# Patient Record
Sex: Female | Born: 1940
Health system: Southern US, Community
[De-identification: ages and names within clinical notes are randomized; demographics above are authoritative.]

## PROBLEM LIST (undated history)

## (undated) DIAGNOSIS — E785 Hyperlipidemia, unspecified: Secondary | ICD-10-CM

## (undated) DIAGNOSIS — N183 Chronic kidney disease, stage 3 unspecified: Secondary | ICD-10-CM

## (undated) DIAGNOSIS — N179 Acute kidney failure, unspecified: Secondary | ICD-10-CM

## (undated) DIAGNOSIS — K922 Gastrointestinal hemorrhage, unspecified: Secondary | ICD-10-CM

## (undated) DIAGNOSIS — I700 Atherosclerosis of aorta: Secondary | ICD-10-CM

## (undated) DIAGNOSIS — I739 Peripheral vascular disease, unspecified: Secondary | ICD-10-CM

## (undated) DIAGNOSIS — I10 Essential (primary) hypertension: Secondary | ICD-10-CM

## (undated) DIAGNOSIS — G3184 Mild cognitive impairment, so stated: Secondary | ICD-10-CM

## (undated) DIAGNOSIS — E7800 Pure hypercholesterolemia, unspecified: Secondary | ICD-10-CM

## (undated) DIAGNOSIS — D509 Iron deficiency anemia, unspecified: Secondary | ICD-10-CM

## (undated) DIAGNOSIS — D126 Benign neoplasm of colon, unspecified: Secondary | ICD-10-CM

## (undated) DIAGNOSIS — I509 Heart failure, unspecified: Secondary | ICD-10-CM

## (undated) DIAGNOSIS — E119 Type 2 diabetes mellitus without complications: Secondary | ICD-10-CM

## (undated) DIAGNOSIS — G2581 Restless legs syndrome: Secondary | ICD-10-CM

## (undated) DIAGNOSIS — D649 Anemia, unspecified: Secondary | ICD-10-CM

## (undated) DIAGNOSIS — K219 Gastro-esophageal reflux disease without esophagitis: Secondary | ICD-10-CM

## (undated) DIAGNOSIS — K5520 Angiodysplasia of colon without hemorrhage: Secondary | ICD-10-CM

## (undated) DIAGNOSIS — Z5189 Encounter for other specified aftercare: Secondary | ICD-10-CM

## (undated) DIAGNOSIS — I2510 Atherosclerotic heart disease of native coronary artery without angina pectoris: Secondary | ICD-10-CM

## (undated) DIAGNOSIS — Z9289 Personal history of other medical treatment: Secondary | ICD-10-CM

## (undated) HISTORY — PX: OTHER SURGICAL HISTORY: SHX169

## (undated) HISTORY — DX: Angiodysplasia of colon without hemorrhage: K55.20

## (undated) HISTORY — DX: Essential (primary) hypertension: I10

## (undated) HISTORY — DX: Hyperlipidemia, unspecified: E78.5

## (undated) HISTORY — PX: DOPPLER ECHOCARDIOGRAPHY: SHX263

## (undated) HISTORY — DX: Restless legs syndrome: G25.81

## (undated) HISTORY — DX: Type 2 diabetes mellitus without complications: E11.9

---

## 1974-10-24 HISTORY — PX: ABDOMINAL HYSTERECTOMY: SHX81

## 2001-11-24 HISTORY — PX: ABDOMINAL ADHESION SURGERY: SHX90

## 2002-10-29 ENCOUNTER — Encounter: Payer: Self-pay | Admitting: Internal Medicine

## 2004-11-01 ENCOUNTER — Ambulatory Visit: Payer: Self-pay | Admitting: Internal Medicine

## 2005-05-04 ENCOUNTER — Ambulatory Visit: Payer: Self-pay | Admitting: Internal Medicine

## 2005-11-15 ENCOUNTER — Ambulatory Visit: Payer: Self-pay | Admitting: Internal Medicine

## 2006-01-16 ENCOUNTER — Ambulatory Visit: Payer: Self-pay | Admitting: Internal Medicine

## 2006-05-17 ENCOUNTER — Ambulatory Visit: Payer: Self-pay | Admitting: Internal Medicine

## 2006-05-29 ENCOUNTER — Ambulatory Visit: Payer: Self-pay | Admitting: Unknown Physician Specialty

## 2006-07-24 HISTORY — PX: ESOPHAGOGASTRODUODENOSCOPY: SHX1529

## 2006-07-24 LAB — HM COLONOSCOPY

## 2006-08-04 ENCOUNTER — Other Ambulatory Visit: Payer: Self-pay

## 2006-08-04 ENCOUNTER — Ambulatory Visit: Payer: Self-pay | Admitting: Internal Medicine

## 2006-08-04 ENCOUNTER — Inpatient Hospital Stay: Payer: Self-pay | Admitting: Internal Medicine

## 2006-08-11 ENCOUNTER — Encounter: Payer: Self-pay | Admitting: Internal Medicine

## 2006-08-15 ENCOUNTER — Ambulatory Visit: Payer: Self-pay | Admitting: Internal Medicine

## 2006-08-18 ENCOUNTER — Ambulatory Visit: Payer: Self-pay | Admitting: Internal Medicine

## 2006-09-21 ENCOUNTER — Ambulatory Visit: Payer: Self-pay | Admitting: Internal Medicine

## 2006-11-28 ENCOUNTER — Encounter: Payer: Self-pay | Admitting: Internal Medicine

## 2007-02-02 ENCOUNTER — Encounter: Payer: Self-pay | Admitting: Internal Medicine

## 2007-02-02 ENCOUNTER — Ambulatory Visit: Payer: Self-pay | Admitting: Internal Medicine

## 2007-02-02 DIAGNOSIS — E7800 Pure hypercholesterolemia, unspecified: Secondary | ICD-10-CM

## 2007-02-02 DIAGNOSIS — K558 Other vascular disorders of intestine: Secondary | ICD-10-CM | POA: Insufficient documentation

## 2007-02-02 DIAGNOSIS — I10 Essential (primary) hypertension: Secondary | ICD-10-CM

## 2007-02-02 DIAGNOSIS — G2589 Other specified extrapyramidal and movement disorders: Secondary | ICD-10-CM | POA: Insufficient documentation

## 2007-02-02 DIAGNOSIS — E118 Type 2 diabetes mellitus with unspecified complications: Secondary | ICD-10-CM

## 2007-02-02 DIAGNOSIS — K5520 Angiodysplasia of colon without hemorrhage: Secondary | ICD-10-CM

## 2007-02-02 LAB — CONVERTED CEMR LAB
Basophils Relative: 0.1 % (ref 0.0–1.0)
Eosinophils Absolute: 0 10*3/uL (ref 0.0–0.6)
Eosinophils Relative: 0.6 % (ref 0.0–5.0)
MCV: 83.6 fL (ref 78.0–100.0)
Microalb Creat Ratio: 5.8 mg/g (ref 0.0–30.0)
Microalb, Ur: 0.2 mg/dL (ref 0.0–1.9)
Platelets: 223 10*3/uL (ref 150–400)
RBC: 4.28 M/uL (ref 3.87–5.11)
Total CHOL/HDL Ratio: 3.8
Triglycerides: 97 mg/dL (ref 0–149)
VLDL: 19 mg/dL (ref 0–40)
WBC: 5.1 10*3/uL (ref 4.5–10.5)

## 2007-08-07 ENCOUNTER — Ambulatory Visit: Payer: Self-pay | Admitting: Internal Medicine

## 2007-08-08 LAB — CONVERTED CEMR LAB
ALT: 15 units/L (ref 0–35)
Albumin: 4.4 g/dL (ref 3.5–5.2)
Alkaline Phosphatase: 61 units/L (ref 39–117)
CO2: 30 meq/L (ref 19–32)
Chloride: 107 meq/L (ref 96–112)
Cholesterol: 165 mg/dL (ref 0–200)
Creatinine, Ser: 0.6 mg/dL (ref 0.4–1.2)
GFR calc Af Amer: 129 mL/min
Glucose, Bld: 165 mg/dL — ABNORMAL HIGH (ref 70–99)
LDL Cholesterol: 113 mg/dL — ABNORMAL HIGH (ref 0–99)
Sodium: 144 meq/L (ref 135–145)
Total Protein: 8 g/dL (ref 6.0–8.3)

## 2008-01-24 ENCOUNTER — Ambulatory Visit: Payer: Self-pay | Admitting: Internal Medicine

## 2008-01-24 LAB — CONVERTED CEMR LAB
ALT: 11 units/L (ref 0–35)
AST: 18 units/L (ref 0–37)
BUN: 9 mg/dL (ref 6–23)
Basophils Absolute: 0 10*3/uL (ref 0.0–0.1)
Bilirubin, Direct: 0.1 mg/dL (ref 0.0–0.3)
CO2: 29 meq/L (ref 19–32)
Calcium: 9.6 mg/dL (ref 8.4–10.5)
Glucose, Bld: 128 mg/dL — ABNORMAL HIGH (ref 70–99)
HDL: 32.9 mg/dL — ABNORMAL LOW (ref >39.0)
Hgb A1c MFr Bld: 7.5 % — ABNORMAL HIGH (ref 4.6–6.0)
LDL Cholesterol: 103 mg/dL — ABNORMAL HIGH (ref 0–99)
Lymphocytes Relative: 28.8 % (ref 12.0–46.0)
MCHC: 33.4 g/dL (ref 30.0–36.0)
Microalb, Ur: 1.1 mg/dL (ref 0.0–1.9)
Monocytes Relative: 7.3 % (ref 3.0–12.0)
Neutrophils Relative %: 62.6 % (ref 43.0–77.0)
Phosphorus: 3.4 mg/dL (ref 2.3–4.6)
Platelets: 204 10*3/uL (ref 150–400)
Potassium: 3.9 meq/L (ref 3.5–5.1)
RDW: 12.9 % (ref 11.5–14.6)
TSH: 0.8 microintl units/mL (ref 0.35–5.50)
Total Bilirubin: 0.7 mg/dL (ref 0.3–1.2)
Total CHOL/HDL Ratio: 4.5
Triglycerides: 56 mg/dL (ref 0–149)
VLDL: 11 mg/dL (ref 0–40)

## 2008-02-20 ENCOUNTER — Encounter: Payer: Self-pay | Admitting: Internal Medicine

## 2008-02-22 ENCOUNTER — Encounter (INDEPENDENT_AMBULATORY_CARE_PROVIDER_SITE_OTHER): Payer: Self-pay | Admitting: *Deleted

## 2008-09-12 ENCOUNTER — Ambulatory Visit: Payer: Self-pay | Admitting: Internal Medicine

## 2008-09-15 LAB — CONVERTED CEMR LAB
Albumin: 4 g/dL (ref 3.5–5.2)
Alkaline Phosphatase: 56 units/L (ref 39–117)
Basophils Absolute: 0 10*3/uL (ref 0.0–0.1)
Basophils Relative: 0.5 % (ref 0.0–3.0)
CO2: 27 meq/L (ref 19–32)
Chloride: 106 meq/L (ref 96–112)
Cholesterol: 153 mg/dL (ref 0–200)
Creatinine, Ser: 0.6 mg/dL (ref 0.4–1.2)
Eosinophils Absolute: 0.1 10*3/uL (ref 0.0–0.7)
Eosinophils Relative: 1.3 % (ref 0.0–5.0)
GFR calc Af Amer: 129 mL/min
GFR calc non Af Amer: 106 mL/min
HCT: 37.7 % (ref 36.0–46.0)
Hgb A1c MFr Bld: 7 % — ABNORMAL HIGH (ref 4.6–6.0)
MCHC: 33.7 g/dL (ref 30.0–36.0)
MCV: 83.8 fL (ref 78.0–100.0)
Monocytes Absolute: 0.4 10*3/uL (ref 0.1–1.0)
Neutro Abs: 2.8 10*3/uL (ref 1.4–7.7)
Neutrophils Relative %: 59.3 % (ref 43.0–77.0)
Potassium: 3.7 meq/L (ref 3.5–5.1)
RBC: 4.49 M/uL (ref 3.87–5.11)
Sodium: 140 meq/L (ref 135–145)
Total Protein: 7.2 g/dL (ref 6.0–8.3)
VLDL: 11 mg/dL (ref 0–40)
WBC: 4.8 10*3/uL (ref 4.5–10.5)

## 2008-10-24 HISTORY — PX: OTHER SURGICAL HISTORY: SHX169

## 2009-03-17 ENCOUNTER — Ambulatory Visit: Payer: Self-pay | Admitting: Internal Medicine

## 2009-03-18 LAB — CONVERTED CEMR LAB
AST: 23 units/L (ref 0–37)
Albumin: 4.3 g/dL (ref 3.5–5.2)
Alkaline Phosphatase: 62 units/L (ref 39–117)
Basophils Absolute: 0 10*3/uL (ref 0.0–0.1)
Basophils Relative: 0.1 % (ref 0.0–3.0)
CO2: 30 meq/L (ref 19–32)
Calcium: 9.8 mg/dL (ref 8.4–10.5)
Chloride: 110 meq/L (ref 96–112)
Creatinine, Ser: 0.7 mg/dL (ref 0.4–1.2)
Creatinine,U: 39.7 mg/dL
Eosinophils Absolute: 0 10*3/uL (ref 0.0–0.7)
Hemoglobin: 12.9 g/dL (ref 12.0–15.0)
LDL Cholesterol: 91 mg/dL (ref 0–99)
MCHC: 34.9 g/dL (ref 30.0–36.0)
MCV: 82.8 fL (ref 78.0–100.0)
Microalb Creat Ratio: 5 mg/g (ref 0.0–30.0)
Microalb, Ur: 0.2 mg/dL (ref 0.0–1.9)
Monocytes Absolute: 0.4 10*3/uL (ref 0.1–1.0)
Neutro Abs: 3.3 10*3/uL (ref 1.4–7.7)
RBC: 4.44 M/uL (ref 3.87–5.11)
RDW: 12.8 % (ref 11.5–14.6)
Total Bilirubin: 0.7 mg/dL (ref 0.3–1.2)
Total CHOL/HDL Ratio: 4
Triglycerides: 69 mg/dL (ref 0.0–149.0)

## 2009-04-06 ENCOUNTER — Encounter: Payer: Self-pay | Admitting: Internal Medicine

## 2009-04-06 LAB — HM MAMMOGRAPHY

## 2009-04-08 ENCOUNTER — Encounter: Payer: Self-pay | Admitting: Internal Medicine

## 2009-06-24 ENCOUNTER — Telehealth: Payer: Self-pay | Admitting: Internal Medicine

## 2009-08-10 ENCOUNTER — Encounter: Payer: Self-pay | Admitting: Internal Medicine

## 2009-08-17 ENCOUNTER — Telehealth: Payer: Self-pay | Admitting: Internal Medicine

## 2009-08-21 ENCOUNTER — Ambulatory Visit: Payer: Self-pay | Admitting: Internal Medicine

## 2009-08-24 HISTORY — PX: DOBUTAMINE STRESS ECHO: SHX5426

## 2009-08-24 LAB — CONVERTED CEMR LAB
Albumin: 4.4 g/dL (ref 3.5–5.2)
BUN: 9 mg/dL (ref 6–23)
Calcium: 9.4 mg/dL (ref 8.4–10.5)
Creatinine, Ser: 0.7 mg/dL (ref 0.4–1.2)
Glucose, Bld: 109 mg/dL — ABNORMAL HIGH (ref 70–99)

## 2009-09-01 ENCOUNTER — Encounter: Payer: Self-pay | Admitting: Internal Medicine

## 2009-09-04 ENCOUNTER — Ambulatory Visit: Payer: Self-pay | Admitting: Internal Medicine

## 2009-09-09 ENCOUNTER — Telehealth: Payer: Self-pay | Admitting: Internal Medicine

## 2009-09-11 ENCOUNTER — Ambulatory Visit: Payer: Self-pay

## 2009-09-11 ENCOUNTER — Encounter: Payer: Self-pay | Admitting: Internal Medicine

## 2009-09-11 ENCOUNTER — Ambulatory Visit (HOSPITAL_COMMUNITY): Admission: RE | Admit: 2009-09-11 | Discharge: 2009-09-11 | Payer: Self-pay | Admitting: Internal Medicine

## 2009-09-11 ENCOUNTER — Ambulatory Visit: Payer: Self-pay | Admitting: Cardiology

## 2009-09-22 ENCOUNTER — Ambulatory Visit: Payer: Self-pay | Admitting: Unknown Physician Specialty

## 2009-12-16 ENCOUNTER — Ambulatory Visit: Payer: Self-pay | Admitting: Internal Medicine

## 2009-12-17 LAB — CONVERTED CEMR LAB
Alkaline Phosphatase: 54 units/L (ref 39–117)
BUN: 8 mg/dL (ref 6–23)
Basophils Absolute: 0 10*3/uL (ref 0.0–0.1)
Bilirubin, Direct: 0 mg/dL (ref 0.0–0.3)
Chloride: 109 meq/L (ref 96–112)
Cholesterol: 134 mg/dL (ref 0–200)
Creatinine, Ser: 0.7 mg/dL (ref 0.4–1.2)
Eosinophils Absolute: 0.1 10*3/uL (ref 0.0–0.7)
Eosinophils Relative: 1.6 % (ref 0.0–5.0)
GFR calc non Af Amer: 107.07 mL/min (ref >60)
HCT: 35.5 % — ABNORMAL LOW (ref 36.0–46.0)
Hgb A1c MFr Bld: 7.4 % — ABNORMAL HIGH (ref 4.6–6.5)
LDL Cholesterol: 75 mg/dL (ref 0–99)
Lymphs Abs: 1.5 10*3/uL (ref 0.7–4.0)
MCHC: 33 g/dL (ref 30.0–36.0)
MCV: 82.7 fL (ref 78.0–100.0)
Microalb Creat Ratio: 5.3 mg/g (ref 0.0–30.0)
Monocytes Absolute: 0.4 10*3/uL (ref 0.1–1.0)
Platelets: 225 10*3/uL (ref 150.0–400.0)
RDW: 13.8 % (ref 11.5–14.6)
Total Protein: 7.4 g/dL (ref 6.0–8.3)

## 2010-02-02 ENCOUNTER — Telehealth: Payer: Self-pay | Admitting: Internal Medicine

## 2010-05-10 ENCOUNTER — Telehealth: Payer: Self-pay | Admitting: Internal Medicine

## 2010-08-11 ENCOUNTER — Ambulatory Visit: Payer: Self-pay | Admitting: Internal Medicine

## 2010-08-12 LAB — CONVERTED CEMR LAB
Basophils Relative: 0.4 % (ref 0.0–3.0)
Calcium: 9.8 mg/dL (ref 8.4–10.5)
Eosinophils Relative: 2.5 % (ref 0.0–5.0)
GFR calc non Af Amer: 64.79 mL/min (ref >60)
Glucose, Bld: 77 mg/dL (ref 70–99)
Lymphocytes Relative: 30.7 % (ref 12.0–46.0)
Neutrophils Relative %: 57 % (ref 43.0–77.0)
Phosphorus: 3.6 mg/dL (ref 2.3–4.6)
Potassium: 4.6 meq/L (ref 3.5–5.1)
RBC: 3.87 M/uL (ref 3.87–5.11)
Sodium: 143 meq/L (ref 135–145)
WBC: 6.3 10*3/uL (ref 4.5–10.5)

## 2010-08-17 ENCOUNTER — Encounter: Payer: Self-pay | Admitting: Internal Medicine

## 2010-08-17 LAB — HM DIABETES EYE EXAM

## 2010-08-26 ENCOUNTER — Telehealth: Payer: Self-pay | Admitting: Internal Medicine

## 2010-11-23 NOTE — Progress Notes (Signed)
Summary: refill request for clonazepam  Phone Note Refill Request Message from:  [employer]  Refills Requested: Medication #1:  CLONAZEPAM 0.5 MG TABS 1 at bedtime as needed for sleep problems.   Last Refilled: 08/22/2009 Electronic request from [facility]  Initial call taken by: Lowella Petties CMA,  May 10, 2010 3:01 PM  Follow-up for Phone Call        okay #30 x 0 Follow-up by: Cindee Salt MD,  May 10, 2010 3:58 PM  Additional Follow-up for Phone Call Additional follow up Details #1::        [facility]. Additional Follow-up by: Lowella Petties CMA,  May 10, 2010 4:05 PM    Prescriptions: CLONAZEPAM 0.5 MG TABS (CLONAZEPAM) 1 at bedtime as needed for sleep problems  #30 x 0   Entered by:   Lowella Petties CMA   Authorized by:   Cindee Salt MD   Signed by:   Lowella Petties CMA on 05/10/2010   Method used:   Telephoned to ...       [facility] [phone]* (retail)       [address] [place], [place]  [address]       Ph: [phone]       Fax: [phone]   RxID:   [id number]

## 2010-11-23 NOTE — Assessment & Plan Note (Signed)
Summary: 5 M F/U DLO   Vital Signs:  Patient profile:   70 year old female Weight:      139 pounds Temp:     97.9 degrees F oral Pulse rate:   88 / minute Pulse rhythm:   regular BP sitting:   150 / 70  (left arm) Cuff size:   regular  Vitals Entered By: Mervin Hack CMA Duncan Dull) (December 16, 2009 8:01 AM) CC: 4 month follow-up   History of Present Illness: Had laryngeal polyp removed in November Voice is a little weaker No swallowing problems  Still on metformin once a day No problems with this checks most mornings----  often under 100 Occ feels shaky  No sores or pain in feet  No stomach trouble no bleeding  No chest pain No SOB No change in exercise tolerance  Allergies: 1)  Glucophage (Metformin Hcl) 2)  Procardia (Nifedipine)  Past History:  Past medical, surgical, family and social histories (including risk factors) reviewed for relevance to current acute and chronic problems.  Past Medical History: Reviewed history from 09/12/2008 and no changes required. Diabetes mellitus, type II Hypertension Hyperlipidemia Restless legs syndrome GI arteriovenous malformations--stoomach  [facility] Dr Marva Panda    [phone] Dr Beckey Rutter  [phone]  Past Surgical History: Hysterectomy/RSO (fibroid) 1976 TOP Lysis of adhesiions Katrinka Blazing) 02/03 Anemia- EGD/colon- multiple angiooectasias 10/07 Echo- EF 55%, mild MR, TR 11/10  Stress echo normal 1/10 Laryngeal polyp--- Dr Jenne Campus  Family History: Reviewed history from 07/12/2007 and no changes required. Dad died of stroke @60  Mom and sibling with HTN GM with DM No CAD or cancer  Social History: Reviewed history from 07/12/2007 and no changes required. 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   Neck:  supple, no masses, no thyromegaly, no carotid bruits, and no cervical lymphadenopathy.   Lungs:  normal respiratory effort and normal breath sounds.   Heart:  normal rate, regular rhythm, no murmur, and no gallop.   Abdomen:  soft and non-tender.   Msk:  no joint tenderness and no joint swelling.   Pulses:  2+ in feet Extremities:  no edema Neurologic:  alert & oriented X3, strength normal in all extremities, and gait normal.   Skin:  no rashes, no suspicious lesions, and no ulcerations.   Psych:  normally interactive, good eye contact, not anxious appearing, and not depressed appearing.    Diabetes Management Exam:    Foot Exam (with socks and/or shoes not present):       Sensory-Pinprick/Light touch:          Left medial foot (L-4): normal          Left dorsal foot (L-5): normal          Left lateral foot (S-1): normal          Right medial foot (L-4): normal          Right dorsal foot (L-5): normal          Right lateral foot (S-1): normal       Inspection:          Left foot: normal          Right foot: normal       Nails:  Left foot: thickened          Right foot: thickened   Impression & Recommendations:  Problem # 1:  DIABETES MELLITUS, TYPE II (ICD-250.00) Assessment Comment Only  doing well sugars run low will change the glucotrol to just daily  Her updated medication list for this problem includes:    Lisinopril 40 Mg Tabs (Lisinopril) .Marland Kitchen... Take 1 tablet by mouth once a day    Glucotrol Xl 10 Mg Tb24 (Glipizide) .Marland Kitchen... Take one by mouth daily before breakfast    Metformin Hcl 500 Mg Xr24h-tab (Metformin hcl) .Marland Kitchen... Take 1 tablet by mouth once a day  Labs Reviewed: Creat: 0.7 (08/21/2009)     Last Eye Exam: No diabetic retinopathy.    (09/01/2009) Reviewed HgBA1c results: 6.9 (08/21/2009)  7.7 (03/17/2009)  Orders: TLB-Microalbumin/Creat Ratio, Urine (82043-MALB) TLB-A1C / Hgb A1C (Glycohemoglobin)  (83036-A1C)  Problem # 2:  HYPERTENSION (ICD-401.9) Assessment: Unchanged  probably okay will add med if urine microal is positive  Her updated medication list for this problem includes:    Lisinopril 40 Mg Tabs (Lisinopril) .Marland Kitchen... Take 1 tablet by mouth once a day  BP today: 150/70 Prior BP: 160/70 (09/04/2009)  Prior 10 Yr Risk Heart Disease: Not enough information (02/02/2007)  Labs Reviewed: K+: 3.9 (08/21/2009) Creat: : 0.7 (08/21/2009)   Chol: 143 (03/17/2009)   HDL: 38.20 (03/17/2009)   LDL: 91 (03/17/2009)   TG: 69.0 (03/17/2009)  Orders: TLB-Renal Function Panel (80069-RENAL) TLB-TSH (Thyroid Stimulating Hormone) (84443-TSH)  Problem # 3:  HYPERCHOLESTEROLEMIA (ICD-272.0) Assessment: Unchanged  good control due for labs  Her updated medication list for this problem includes:    Simvastatin 80 Mg Tabs (Simvastatin) .Marland Kitchen... Take 1 tablet by mouth once a day  Labs Reviewed: SGOT: 23 (03/17/2009)   SGPT: 14 (03/17/2009)  Prior 10 Yr Risk Heart Disease: Not enough information (02/02/2007)   HDL:38.20 (03/17/2009), 37.9 (09/12/2008)  LDL:91 (03/17/2009), 105 (84/13/2440)  Chol:143 (03/17/2009), 153 (09/12/2008)  Trig:69.0 (03/17/2009), 53 (09/12/2008)  Orders: TLB-Lipid Panel (80061-LIPID) TLB-Hepatic/Liver Function Pnl (80076-HEPATIC)  Problem # 4:  ARTERIOVENOUS MALFORMATION (ICD-747.60) Assessment: Unchanged  on iron will check CBC  Orders: TLB-CBC Platelet - w/Differential (85025-CBCD) Venipuncture (10272)  Complete Medication List: 1)  Simvastatin 80 Mg Tabs (Simvastatin) .... Take 1 tablet by mouth once a day 2)  Omeprazole 20 Mg Cpdr (Omeprazole) .... Take 1 capsule by mouth once a day 3)  Lisinopril 40 Mg Tabs (Lisinopril) .... Take 1 tablet by mouth once a day 4)  Sucralfate 1 Gm Tabs (Sucralfate) .... Take one by mouth before meals and at bedtime 5)  Glucotrol Xl 10 Mg Tb24 (Glipizide) .... Take one by mouth daily before breakfast 6)  Onetouch Test  Strp (Glucose blood) .Marland Kitchen.. 1 daily as directed 7)  Metformin Hcl 500 Mg Xr24h-tab (Metformin hcl) .... Take 1 tablet by mouth once a day 8)  Ferrous Sulfate 324 Mg Tabs (Ferrous sulfate) .... Take 1 by mouth once daily 9)  Clonazepam 0.5 Mg Tabs (Clonazepam) .Marland Kitchen.. 1 at bedtime as needed for sleep problems  Patient Instructions: 1)  Please decrease the glucotrol to just one daily 2)  Please schedule a follow-up appointment in 6 months .   Current Allergies (reviewed today): GLUCOPHAGE (METFORMIN HCL) PROCARDIA (NIFEDIPINE)      Mammogram  Procedure date:  04/06/2009  Findings:      No specific mammographic evidence of malignancy.

## 2010-11-23 NOTE — Progress Notes (Signed)
Summary: CLONAZEPAM  Phone Note Refill Request Message from:  [facility] [phone] on August 26, 2010 8:45 AM  Refills Requested: Medication #1:  CLONAZEPAM 0.5 MG TABS 1 at bedtime as needed for sleep problems   Last Refilled: 05/10/2010 E-Scribe Request    Method Requested: Telephone to Pharmacy Initial call taken by: Mervin Hack CMA Duncan Dull),  August 26, 2010 8:45 AM  Follow-up for Phone Call        okay #30 x 0 Follow-up by: Cindee Salt MD,  August 26, 2010 1:17 PM  Additional Follow-up for Phone Call Additional follow up Details #1::        Rx called to pharmacy Additional Follow-up by: DeShannon Katrinka Blazing CMA Duncan Dull),  August 26, 2010 3:14 PM    Prescriptions: CLONAZEPAM 0.5 MG TABS (CLONAZEPAM) 1 at bedtime as needed for sleep problems  #30 x 0   Entered by:   Mervin Hack CMA (AAMA)   Authorized by:   Cindee Salt MD   Signed by:   Mervin Hack CMA (AAMA) on 08/26/2010   Method used:   Telephoned to ...       [facility] [phone]* (retail)       [address] [place], [place]  [address]       Ph: [phone]       Fax: [phone]   RxID:   [id number]

## 2010-11-23 NOTE — Letter (Signed)
Summary: [facility]  [facility]   Imported By: Sherian Rein 09/28/2010 11:44:09  _____________________________________________________________________  External Attachment:    Type:   Image     Comment:   External Document  Appended Document: [facility]    Clinical Lists Changes  Observations: Added new observation of DIAB EYE EX: No diabetic retinopathy.   Glaucoma.   Cataract.    (08/17/2010 13:33)       Diabetic Eye Exam  Procedure date:  08/17/2010  Findings:      No diabetic retinopathy.   Glaucoma.   Cataract.

## 2010-11-23 NOTE — Progress Notes (Signed)
Summary: form for diabetic supplies from [facility]  Phone Note From Pharmacy   Caller: [facility] Summary of Call: Form for diabetic supplies is on  your desk, I checked with the pt and she does want this. Initial call taken by: Lowella Petties CMA,  February 02, 2010 4:34 PM  Follow-up for Phone Call        form done Follow-up by: Cindee Salt MD,  February 03, 2010 1:48 PM  Additional Follow-up for Phone Call Additional follow up Details #1::        form faxed back to [facility] Additional Follow-up by: Mervin Hack CMA Duncan Dull),  February 03, 2010 2:16 PM

## 2010-11-23 NOTE — Assessment & Plan Note (Signed)
Summary: 6 month follow up/alc   Vital Signs:  Patient profile:   70 year old female Weight:      135 pounds BMI:     23.62 Temp:     98.0 degrees F oral Pulse rate:   76 / minute Pulse rhythm:   regular BP sitting:   160 / 68  (left arm) Cuff size:   regular  Vitals Entered By: Mervin Hack CMA Duncan Dull) (August 11, 2010 10:38 AM) CC: 6 month follow-up   History of Present Illness: DOing well No new concerns  No signs of bleeding No problems with energy Still on iron tabs  Checks sugars every 2-3 days Highest is 143 No hypoglycemic reactions  No cehst pain No SOB No palpitations No edema  No trouble with chol med  Allergies: 1)  Glucophage (Metformin Hcl) 2)  Procardia (Nifedipine)  Past History:  Past medical, surgical, family and social histories (including risk factors) reviewed for relevance to current acute and chronic problems.  Past Medical History: Reviewed history from 09/12/2008 and no changes required. Diabetes mellitus, type II Hypertension Hyperlipidemia Restless legs syndrome GI arteriovenous malformations--stoomach  [facility] Dr Marva Panda    [phone] Dr Beckey Rutter  [phone]  Past Surgical History: Reviewed history from 12/16/2009 and no changes required. Hysterectomy/RSO (fibroid) 1976 TOP Lysis of adhesiions Katrinka Blazing) 02/03 Anemia- EGD/colon- multiple angiooectasias 10/07 Echo- EF 55%, mild MR, TR 11/10  Stress echo normal 1/10 Laryngeal polyp--- Dr Jenne Campus  Family History: Reviewed history from 07/12/2007 and no changes required. Dad died of stroke @60  Mom and sibling with HTN GM with DM No CAD or cancer  Social History: Reviewed history from 07/12/2007 and no changes required. 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   Neck:  supple, no masses, no thyromegaly, no carotid bruits, and no cervical lymphadenopathy.   Lungs:  normal respiratory effort, no intercostal retractions, no accessory muscle use, and normal breath sounds.   Heart:  normal rate, regular rhythm, no murmur, and no gallop.   Msk:  no joint tenderness and no joint swelling.   Pulses:  1+ in feet Extremities:  no edema Neurologic:  alert & oriented X3, strength normal in all extremities, and gait normal.   Skin:  no suspicious lesions and no ulcerations.   Psych:  normally interactive, good eye contact, not anxious appearing, and not depressed appearing.    Diabetes Management Exam:    Foot Exam (with socks and/or shoes not present):       Sensory-Pinprick/Light touch:          Left medial foot (L-4): normal          Left dorsal foot (L-5): normal          Left lateral foot (S-1): normal          Right medial foot (L-4): normal          Right dorsal foot (L-5): normal          Right lateral foot (S-1): normal       Inspection:          Left foot: normal          Right foot: normal  Nails:          Left foot: thickened          Right foot: thickened   Impression & Recommendations:  Problem # 1:  DIABETES MELLITUS, TYPE II (ICD-250.00) Assessment Unchanged  seems to still have good control will check A1c  Her updated medication list for this problem includes:    Lisinopril 40 Mg Tabs (Lisinopril) .Marland Kitchen... Take 1 tablet by mouth once a day    Glucotrol Xl 10 Mg Tb24 (Glipizide) .Marland Kitchen... Take one by mouth daily before breakfast    Metformin Hcl 500 Mg Xr24h-tab (Metformin hcl) .Marland Kitchen... Take 1 tablet by mouth once a day  Labs Reviewed: Creat: 0.7 (12/16/2009)     Last Eye Exam: No diabetic retinopathy.    (09/01/2009) Reviewed HgBA1c results: 7.4 (12/16/2009)  6.9 (08/21/2009)  Orders: TLB-A1C / Hgb A1C (Glycohemoglobin)  (83036-A1C) Venipuncture (16109)  Problem # 2:  HYPERTENSION (ICD-401.9) Assessment: Unchanged  reasonable control no change for now--risk of hyptension given age and relatively low diastolic  Her updated medication list for this problem includes:    Lisinopril 40 Mg Tabs (Lisinopril) .Marland Kitchen... Take 1 tablet by mouth once a day  BP today: 160/68 Prior BP: 150/70 (12/16/2009)  Prior 10 Yr Risk Heart Disease: Not enough information (02/02/2007)  Labs Reviewed: K+: 3.9 (12/16/2009) Creat: : 0.7 (12/16/2009)   Chol: 134 (12/16/2009)   HDL: 42.90 (12/16/2009)   LDL: 75 (12/16/2009)   TG: 79.0 (12/16/2009)  Orders: TLB-Renal Function Panel (80069-RENAL) TLB-CBC Platelet - w/Differential (85025-CBCD)  Problem # 3:  ARTERIOVENOUS MALFORMATION (ICD-747.60) Assessment: Unchanged no apparent bleeding will check CBC  Problem # 4:  HYPERCHOLESTEROLEMIA (ICD-272.0) Assessment: Unchanged good control labs next time  Her updated medication list for this problem includes:    Simvastatin 80 Mg Tabs (Simvastatin) .Marland Kitchen... Take 1 tablet by mouth once a day  Labs Reviewed: SGOT: 16 (12/16/2009)   SGPT: 9 (12/16/2009)  Prior 10 Yr Risk Heart Disease: Not enough information (02/02/2007)   HDL:42.90 (12/16/2009), 38.20 (03/17/2009)  LDL:75 (12/16/2009), 91 (03/17/2009)  Chol:134 (12/16/2009), 143 (03/17/2009)  Trig:79.0 (12/16/2009), 69.0 (03/17/2009)  Problem # 5:  OTHER SCREENING MAMMOGRAM (ICD-V76.12) Assessment: Comment Only will cut down to every 2 years  Complete Medication List: 1)  Simvastatin 80 Mg Tabs (Simvastatin) .... Take 1 tablet by mouth once a day 2)  Omeprazole 20 Mg Cpdr (Omeprazole) .... Take 1 capsule by mouth once a day 3)  Lisinopril 40 Mg Tabs (Lisinopril) .... Take 1 tablet by mouth once a day 4)  Sucralfate 1 Gm Tabs (Sucralfate) .... Take one by mouth before meals and at bedtime 5)  Glucotrol Xl 10 Mg Tb24 (Glipizide) .... Take one by mouth daily before breakfast 6)   Metformin Hcl 500 Mg Xr24h-tab (Metformin hcl) .... Take 1 tablet by mouth once a day 7)  Clonazepam 0.5 Mg Tabs (Clonazepam) .Marland Kitchen.. 1 at bedtime as needed for sleep problems 8)  Ferrous Sulfate 324 Mg Tabs (Ferrous sulfate) .... Take 1 by mouth once daily 9)  Onetouch Test Strp (Glucose blood) .Marland Kitchen.. 1 daily as directed  Other Orders: Flu Vaccine 55yrs + MEDICARE PATIENTS (U0454) Administration Flu vaccine - MCR (U9811)  Patient Instructions: 1)  Please schedule a follow-up appointment in 6 months .    Orders Added: 1)  Flu Vaccine 7yrs + MEDICARE PATIENTS [Q2039] 2)  Administration Flu vaccine - MCR [G0008] 3)  Est. Patient Level IV [91478] 4)  TLB-A1C / Hgb A1C (Glycohemoglobin) [83036-A1C] 5)  Venipuncture [29562]  6)  TLB-Renal Function Panel [80069-RENAL] 7)  TLB-CBC Platelet - w/Differential [85025-CBCD]   Immunizations Administered:  Influenza Vaccine # 1:    Vaccine Type: Fluvax MCR    Site: left deltoid    Mfr: GlaxoSmithKline    Dose: 0.5 ml    Route: IM    Given by: Mervin Hack CMA (AAMA)    Exp. Date: 04/23/2011    Lot #: [id number]    VIS given: 05/18/10 version given August 11, 2010.  Flu Vaccine Consent Questions:    Do you have a history of severe allergic reactions to this vaccine? no    Any prior history of allergic reactions to egg and/or gelatin? no    Do you have a sensitivity to the preservative Thimersol? no    Do you have a past history of Guillan-Barre Syndrome? no    Do you currently have an acute febrile illness? no    Have you ever had a severe reaction to latex? no    Vaccine information given and explained to patient? yes    Are you currently pregnant? no   Immunizations Administered:  Influenza Vaccine # 1:    Vaccine Type: Fluvax MCR    Site: left deltoid    Mfr: GlaxoSmithKline    Dose: 0.5 ml    Route: IM    Given by: Mervin Hack CMA (AAMA)    Exp. Date: 04/23/2011    Lot #: [id number]    VIS given: 05/18/10 version given  August 11, 2010.  Current Allergies (reviewed today): GLUCOPHAGE (METFORMIN HCL) PROCARDIA (NIFEDIPINE)

## 2010-12-15 ENCOUNTER — Encounter: Payer: Self-pay | Admitting: Internal Medicine

## 2010-12-22 ENCOUNTER — Encounter: Payer: Self-pay | Admitting: Internal Medicine

## 2011-01-03 ENCOUNTER — Telehealth: Payer: Self-pay | Admitting: Internal Medicine

## 2011-01-11 NOTE — Progress Notes (Signed)
Summary: CLONAZEPAM  Phone Note Refill Request Message from:  [facility] [phone] on January 03, 2011 4:07 PM  Refills Requested: Medication #1:  CLONAZEPAM 0.5 MG TABS 1 at bedtime as needed for sleep problems   Last Refilled: 08/26/2010 E-Scribe Request    Method Requested: Telephone to Pharmacy Initial call taken by: Mervin Hack CMA Duncan Dull),  January 03, 2011 4:07 PM  Follow-up for Phone Call        okay #30 x 0 Follow-up by: Cindee Salt MD,  January 03, 2011 5:51 PM  Additional Follow-up for Phone Call Additional follow up Details #1::        Rx called to pharmacy Additional Follow-up by: DeShannon Katrinka Blazing CMA Duncan Dull),  January 03, 2011 6:47 PM    Prescriptions: CLONAZEPAM 0.5 MG TABS (CLONAZEPAM) 1 at bedtime as needed for sleep problems  #30 x 0   Entered by:   Mervin Hack CMA (AAMA)   Authorized by:   Cindee Salt MD   Signed by:   Mervin Hack CMA (AAMA) on 01/03/2011   Method used:   Telephoned to ...       [facility] [phone]* (retail)       [address] [place], [place]  [address]       Ph: [phone]       Fax: [phone]   RxID:   [id number]

## 2011-02-11 ENCOUNTER — Encounter: Payer: Self-pay | Admitting: Internal Medicine

## 2011-02-11 ENCOUNTER — Ambulatory Visit (INDEPENDENT_AMBULATORY_CARE_PROVIDER_SITE_OTHER): Payer: Medicare Other | Admitting: Internal Medicine

## 2011-02-11 VITALS — BP 140/60 | HR 68 | Temp 98.1°F | Ht 63.0 in | Wt 137.0 lb

## 2011-02-11 DIAGNOSIS — E7800 Pure hypercholesterolemia, unspecified: Secondary | ICD-10-CM

## 2011-02-11 DIAGNOSIS — E119 Type 2 diabetes mellitus without complications: Secondary | ICD-10-CM

## 2011-02-11 DIAGNOSIS — K5520 Angiodysplasia of colon without hemorrhage: Secondary | ICD-10-CM

## 2011-02-11 DIAGNOSIS — G2589 Other specified extrapyramidal and movement disorders: Secondary | ICD-10-CM

## 2011-02-11 DIAGNOSIS — I10 Essential (primary) hypertension: Secondary | ICD-10-CM

## 2011-02-11 LAB — CBC WITH DIFFERENTIAL/PLATELET
Basophils Relative: 0.5 % (ref 0.0–3.0)
Eosinophils Relative: 1.5 % (ref 0.0–5.0)
Hemoglobin: 11 g/dL — ABNORMAL LOW (ref 12.0–15.0)
Lymphocytes Relative: 27.2 % (ref 12.0–46.0)
Neutro Abs: 3.7 10*3/uL (ref 1.4–7.7)
Neutrophils Relative %: 61 % (ref 43.0–77.0)
RBC: 3.94 Mil/uL (ref 3.87–5.11)
WBC: 6.1 10*3/uL (ref 4.5–10.5)

## 2011-02-11 LAB — LIPID PANEL
LDL Cholesterol: 84 mg/dL (ref 0–99)
VLDL: 19 mg/dL (ref 0.0–40.0)

## 2011-02-11 LAB — HEMOGLOBIN A1C: Hgb A1c MFr Bld: 6.5 % (ref 4.6–6.5)

## 2011-02-11 NOTE — Progress Notes (Signed)
Subjective:    Patient ID: Kathryn Ware, female    DOB: 09-Jul-1941, 70 y.o.   MRN: [id number]  HPI Doing okay Still enjoying being retirement Likes to keep up yard, flowers Tries to walk regularly Still part time at [employer] (4 hours per day)  Checks sugars 3 times per week Run 130's generally No hypolgycemic reactions Due for eye exam next week  No ches tpain No SOB No sig edema Occ fatigue --if she overdoes it No blood in stool--continues on the iron  Current outpatient prescriptions:clonazePAM (KLONOPIN) 0.5 MG tablet, Take 0.5 mg by mouth at bedtime as needed.  , Disp: , Rfl: ;  ferrous sulfate 325 (65 FE) MG tablet, Take 325 mg by mouth daily with breakfast.  , Disp: , Rfl: ;  glipiZIDE (GLUCOTROL) 10 MG 24 hr tablet, Take 10 mg by mouth daily.  , Disp: , Rfl: ;  glucose blood test strip, 1 each by Other route daily. Use as instructed , Disp: , Rfl:  lisinopril (PRINIVIL,ZESTRIL) 40 MG tablet, Take 40 mg by mouth daily.  , Disp: , Rfl: ;  metFORMIN (GLUCOPHAGE-XR) 500 MG 24 hr tablet, Take 500 mg by mouth daily with breakfast.  , Disp: , Rfl: ;  omeprazole (PRILOSEC) 20 MG capsule, Take 20 mg by mouth daily.  , Disp: , Rfl: ;  simvastatin (ZOCOR) 80 MG tablet, Take 80 mg by mouth at bedtime.  , Disp: , Rfl:  sucralfate (CARAFATE) 1 G tablet, Take 1 g by mouth 4 (four) times daily.  , Disp: , Rfl:   Past Medical History  Diagnosis Date  . Diabetes mellitus type II   . HTN (hypertension)   . HLD (hyperlipidemia)   . RLS (restless legs syndrome)   . GI AVM (gastrointestinal arteriovenous vascular malformation)     stomach    Past Surgical History  Procedure Date  . Abdominal hysterectomy 1976    RSO (fibroid)  . Top   . Abdominal adhesion surgery 11/2001    Dr. Katrinka Blazing  . Esophagogastroduodenoscopy 07/2006    multiple angioectasias  . Doppler echocardiography     EF 55%, mild MR, TR  . Dobutamine stress echo 11/10    normal  . Laryngeal polyp 10/2008    Dr. Jenne Campus     Family History  Problem Relation Age of Onset  . Stroke Father   . Hypertension Mother   . Hypertension      sibling  . Diabetes      grandmother    History   Social History  . Marital Status: Single    Spouse Name: N/A    Number of Children: 1  . Years of Education: N/A   Occupational History  . Seamstress     [employer]   Social History Main Topics  . Smoking status: Current Everyday Smoker  . Smokeless tobacco: Not on file  . Alcohol Use: No  . Drug Use: Not on file  . Sexually Active: Not on file   Other Topics Concern  . Not on file   Social History Narrative  . No narrative on file   Review of Systems Appetite is fine Still doesn't sleep great---but okay Weight is stable     Objective:   Physical Exam  Constitutional: She appears well-developed and well-nourished. No distress.  Neck: Normal range of motion. Neck supple. No thyromegaly present.  Cardiovascular: Normal rate, regular rhythm, normal heart sounds and intact distal pulses.  Exam reveals no gallop.  No murmur heard. Pulmonary/Chest: Effort normal and breath sounds normal. No respiratory distress. She has no wheezes. She has no rales.  Musculoskeletal: Normal range of motion. She exhibits no edema and no tenderness.  Lymphadenopathy:    She has no cervical adenopathy.  Neurological:       Normal sensation in feet  Skin: Skin is warm. No rash noted.       No ulcers or foot problems  Psychiatric: She has a normal mood and affect. Her behavior is normal. Judgment and thought content normal.          Assessment & Plan:

## 2011-02-20 ENCOUNTER — Other Ambulatory Visit: Payer: Self-pay | Admitting: Internal Medicine

## 2011-03-11 NOTE — Assessment & Plan Note (Signed)
[facility]                                   ON-CALL NOTE   NAME:Kathryn Ware, Kathryn Ware                        MRN:          [id number]  DATE:08/04/2006                            DOB:          [birth year]    PHONE CONSULTATION:  We had the conversation at about 4 p.m. on October 12.  Kathryn Ware was in earlier today with complaints about edema.  On further  questioning, she occasionally got some palpitations or feeling of being  aware of her heart beating in her head which has gone back for some time.  She also has had a little bit of change in her exercise tolerance but  nothing striking, no other cardiovascular symptoms. Her exam was really  relatively benign, only a touch of edema.  Nothing to suggest heart failure.  Vital signs were fine as was her abdomen.   This afternoon, a STAT report came back with a critical lab of hemoglobin of  6.3.  So I called her to review this.  Again, nothing acute, but she has  been feeling a little bit tired, but that has not changed at all.  She did  realize that she had changed from the 81 mg aspirin to 325 mg aspirin.   PLAN:  I called GI at [facility], and they wound up paging Dr. Ricki Rodriguez  on [phone].  He answered my call promptly, and we discussed the plan.  Initially, since she was hemodynamically stable, we were going to just have  her seen in the office Monday morning,  but then we decided together that  having her seen in the emergency room, considering transfusion, and going  ahead with at least an EGD to see if she had a ulcer made sense this  weekend.  I called the patient back and asked her to get her sister to bring  her in to the emergency room so that she could be evaluated.            ______________________________  Karie Schwalbe, MD      RIL/MedQ  DD:  08/04/2006  DT:  08/06/2006  Job #:  [id number]

## 2011-03-21 ENCOUNTER — Other Ambulatory Visit: Payer: Self-pay | Admitting: Internal Medicine

## 2011-04-04 ENCOUNTER — Other Ambulatory Visit: Payer: Self-pay | Admitting: Internal Medicine

## 2011-04-25 ENCOUNTER — Other Ambulatory Visit: Payer: Self-pay | Admitting: Internal Medicine

## 2011-05-17 ENCOUNTER — Other Ambulatory Visit: Payer: Self-pay | Admitting: Internal Medicine

## 2011-05-25 ENCOUNTER — Other Ambulatory Visit: Payer: Self-pay | Admitting: Internal Medicine

## 2011-06-03 ENCOUNTER — Other Ambulatory Visit: Payer: Self-pay | Admitting: Internal Medicine

## 2011-08-01 ENCOUNTER — Other Ambulatory Visit: Payer: Self-pay | Admitting: *Deleted

## 2011-08-01 MED ORDER — GLUCOSE BLOOD VI STRP: ORAL_STRIP | Status: AC

## 2011-08-17 ENCOUNTER — Ambulatory Visit (INDEPENDENT_AMBULATORY_CARE_PROVIDER_SITE_OTHER): Payer: Medicare Other | Admitting: Internal Medicine

## 2011-08-17 ENCOUNTER — Encounter: Payer: Self-pay | Admitting: Internal Medicine

## 2011-08-17 VITALS — BP 156/79 | HR 80 | Temp 97.7°F | Ht 63.0 in | Wt 136.0 lb

## 2011-08-17 DIAGNOSIS — G3184 Mild cognitive impairment, so stated: Secondary | ICD-10-CM

## 2011-08-17 DIAGNOSIS — G2581 Restless legs syndrome: Secondary | ICD-10-CM

## 2011-08-17 DIAGNOSIS — Z7289 Other problems related to lifestyle: Secondary | ICD-10-CM

## 2011-08-17 DIAGNOSIS — Z1231 Encounter for screening mammogram for malignant neoplasm of breast: Secondary | ICD-10-CM

## 2011-08-17 DIAGNOSIS — E119 Type 2 diabetes mellitus without complications: Secondary | ICD-10-CM

## 2011-08-17 DIAGNOSIS — Z0000 Encounter for general adult medical examination without abnormal findings: Secondary | ICD-10-CM

## 2011-08-17 DIAGNOSIS — Z609 Problem related to social environment, unspecified: Secondary | ICD-10-CM

## 2011-08-17 DIAGNOSIS — R413 Other amnesia: Secondary | ICD-10-CM

## 2011-08-17 DIAGNOSIS — I10 Essential (primary) hypertension: Secondary | ICD-10-CM

## 2011-08-17 DIAGNOSIS — K5520 Angiodysplasia of colon without hemorrhage: Secondary | ICD-10-CM

## 2011-08-17 DIAGNOSIS — Z23 Encounter for immunization: Secondary | ICD-10-CM

## 2011-08-17 LAB — LIPID PANEL
Cholesterol: 155 mg/dL (ref 0–200)
Triglycerides: 105 mg/dL (ref 0.0–149.0)

## 2011-08-17 LAB — SEDIMENTATION RATE: Sed Rate: 39 mm/hr — ABNORMAL HIGH (ref 0–22)

## 2011-08-17 LAB — HEMOGLOBIN A1C: Hgb A1c MFr Bld: 6.8 % — ABNORMAL HIGH (ref 4.6–6.5)

## 2011-08-17 LAB — CBC WITH DIFFERENTIAL/PLATELET
Basophils Relative: 0.4 % (ref 0.0–3.0)
Eosinophils Relative: 2 % (ref 0.0–5.0)
HCT: 36 % (ref 36.0–46.0)
Hemoglobin: 11.9 g/dL — ABNORMAL LOW (ref 12.0–15.0)
Lymphs Abs: 1.4 10*3/uL (ref 0.7–4.0)
MCV: 85 fl (ref 78.0–100.0)
Monocytes Absolute: 0.4 10*3/uL (ref 0.1–1.0)
Neutro Abs: 3.5 10*3/uL (ref 1.4–7.7)
RBC: 4.23 Mil/uL (ref 3.87–5.11)
WBC: 5.4 10*3/uL (ref 4.5–10.5)

## 2011-08-17 LAB — HEPATIC FUNCTION PANEL
ALT: 12 U/L (ref 0–35)
AST: 17 U/L (ref 0–37)
Albumin: 4.5 g/dL (ref 3.5–5.2)
Total Bilirubin: 0.3 mg/dL (ref 0.3–1.2)
Total Protein: 8 g/dL (ref 6.0–8.3)

## 2011-08-17 LAB — BASIC METABOLIC PANEL
CO2: 25 mEq/L (ref 19–32)
Chloride: 107 mEq/L (ref 96–112)
Potassium: 3.9 mEq/L (ref 3.5–5.1)

## 2011-08-17 LAB — MICROALBUMIN / CREATININE URINE RATIO
Creatinine,U: 100.6 mg/dL
Microalb Creat Ratio: 0.3 mg/g (ref 0.0–30.0)

## 2011-08-17 NOTE — Assessment & Plan Note (Signed)
Hard to judge function since not sure of baseline cognitive status No functional issues at this point [place] bills, still works and is independent Will just address again at next visit

## 2011-08-17 NOTE — Patient Instructions (Addendum)
Please bring a family member with you to your next visit so we can discuss your memory and decide if there is any problem with it Please schedule your mammogram

## 2011-08-17 NOTE — Assessment & Plan Note (Signed)
Uses the clonazepam prn

## 2011-08-17 NOTE — Assessment & Plan Note (Signed)
I have personally reviewed the Medicare Annual Wellness questionnaire and have noted 1. The patient's medical and social history 2. Their use of alcohol, tobacco or illicit drugs 3. Their current medications and supplements 4. The patient's functional ability including ADL's, fall risks, home safety risks and hearing or visual             impairment. 5. Diet and physical activities 6. Evidence for depression or mood disorders  The patients weight, height, BMI and visual acuity have been recorded in the chart I have made referrals, counseling and provided education to the patient based review of the above and I have provided the pt with a written personalized care plan for preventive services.  I have provided you with a copy of your personalized plan for preventive services. Please take the time to review along with your updated medication list.  Due for mammo--will schedule Rx given for zostavax Some concern about cognitive function---will review at next visit with family

## 2011-08-17 NOTE — Assessment & Plan Note (Signed)
Lab Results  Component Value Date   HGBA1C 6.5 02/11/2011   Good control Will recheck

## 2011-08-17 NOTE — Progress Notes (Signed)
Subjective:    Patient ID: Kathryn Ware, female    DOB: December 29, 1940, 70 y.o.   MRN: [id number]  HPI Here for wellness visit No problems with vision or hearing No falls and no sig risk factors No depression or anhedonia  Flu shot given and Rx for zostavax Had colon Due for mammo  Checks sugars several times a week Still runs good No hypoglycemic reactions Had eye exam this year  No chest pain No sOB No headaches No edema Only occ exercises---walking and some calisthenics  Current Outpatient Prescriptions on File Prior to Visit  Medication Sig Dispense Refill  . clonazePAM (KLONOPIN) 0.5 MG tablet Take 0.5 mg by mouth at bedtime as needed.        . ferrous sulfate 325 (65 FE) MG tablet Take 325 mg by mouth daily with breakfast.        . glipiZIDE (GLUCOTROL XL) 10 MG 24 hr tablet TAKE ONE TABLET BY MOUTH TWICE DAILY  60 tablet  6  . glucose blood (ONE TOUCH ULTRA TEST) test strip Use as instructed  50 each  12  . lisinopril (PRINIVIL,ZESTRIL) 40 MG tablet TAKE ONE TABLET BY MOUTH EVERY DAY  30 tablet  11  . metFORMIN (GLUCOPHAGE-XR) 500 MG 24 hr tablet Take 500 mg by mouth daily with breakfast.        . omeprazole (PRILOSEC) 20 MG capsule Take 20 mg by mouth daily.        . simvastatin (ZOCOR) 80 MG tablet TAKE ONE TABLET BY MOUTH DAILY  30 tablet  11  . sucralfate (CARAFATE) 1 G tablet TAKE ONE TABLET BY MOUTH BEFORE MEALS AND AT BEDTIME  120 tablet  0    Allergies  Allergen Reactions  . Metformin     REACTION: diarrhea  . Nifedipine     REACTION: cough    Past Medical History  Diagnosis Date  . Diabetes mellitus type II   . HTN (hypertension)   . HLD (hyperlipidemia)   . RLS (restless legs syndrome)   . GI AVM (gastrointestinal arteriovenous vascular malformation)     stomach    Past Surgical History  Procedure Date  . Abdominal hysterectomy 1976    RSO (fibroid)  . Top   . Abdominal adhesion surgery 11/2001    Dr. Katrinka Blazing  . Esophagogastroduodenoscopy  07/2006    multiple angioectasias  . Doppler echocardiography     EF 55%, mild MR, TR  . Dobutamine stress echo 11/10    normal  . Laryngeal polyp 10/2008    Dr. Jenne Campus    Family History  Problem Relation Age of Onset  . Stroke Father   . Hypertension Mother   . Hypertension      sibling  . Diabetes      grandmother    History   Social History  . Marital Status: Single    Spouse Name: N/A    Number of Children: 1  . Years of Education: N/A   Occupational History  . 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     Social History Main Topics  . Smoking status: Current Everyday Smoker  . Smokeless tobacco: Never Used  . Alcohol Use: No  . Drug Use: Not on file  . Sexually Active: Not on file   Other Topics Concern  . Not on file   Social History Narrative   No living willRequests son, Kathryn Ware, or sister, Kathryn Ware as health care POAs.Would accept  resuscitation Not sure about feeding tubes   Review of Systems No signs of blood in stool---still on iron Sleeps fair---uses the clonazepam when legs bother her a lot Appetite is fine Weight is stable     Objective:   Physical Exam  Constitutional: She is oriented to person, place, and time. She appears well-developed and well-nourished. No distress.  Neck: Normal range of motion. Neck supple. No thyromegaly present.  Cardiovascular: Normal rate, regular rhythm, normal heart sounds and intact distal pulses.  Exam reveals no gallop.   No murmur heard. Pulmonary/Chest: Effort normal and breath sounds normal. No respiratory distress. She has no wheezes. She has no rales.  Abdominal: Soft. There is no tenderness.  Genitourinary:       Breasts without mass or lesions  Musculoskeletal: Normal range of motion. She exhibits no edema and no tenderness.  Lymphadenopathy:    She has no cervical adenopathy.    She has no axillary adenopathy.  Neurological: She is alert and oriented to person, place, and time.        President--"Obama, Bush, the other [employer] (with prompting)" 100-92-.... D-l-... Recall 1/3  Skin: No rash noted.  Psychiatric: She has a normal mood and affect. Her behavior is normal. Judgment and thought content normal.          Assessment & Plan:

## 2011-08-17 NOTE — Assessment & Plan Note (Signed)
BP Readings from Last 3 Encounters:  08/17/11 156/79  02/11/11 140/60  08/11/10 160/68   Generally has been controlled No change for now

## 2011-08-17 NOTE — Assessment & Plan Note (Signed)
On iron Will check CBC again

## 2011-08-18 LAB — RPR

## 2011-08-26 ENCOUNTER — Encounter: Payer: Self-pay | Admitting: *Deleted

## 2011-08-29 ENCOUNTER — Telehealth: Payer: Self-pay | Admitting: *Deleted

## 2011-08-29 NOTE — Telephone Encounter (Signed)
That is correct 

## 2011-08-29 NOTE — Telephone Encounter (Signed)
Pt states she was told to take vitamin B12 and she is asking how much.  Advised 1000 mcg's a day per lab result note.

## 2011-08-30 ENCOUNTER — Encounter: Payer: Self-pay | Admitting: *Deleted

## 2011-08-31 ENCOUNTER — Encounter: Payer: Self-pay | Admitting: Internal Medicine

## 2011-10-10 ENCOUNTER — Telehealth: Payer: Self-pay | Admitting: *Deleted

## 2011-10-10 NOTE — Telephone Encounter (Signed)
Pt would like to know why she needs to take B12 tablets--read on the bottle "affects memory". Request call back to discuss.

## 2011-10-11 NOTE — Telephone Encounter (Signed)
Spoke with patient and advised results. She doesn't want her memory checked again because she got a bill for $200.00 and she thinks it was for the memory test, I advised that she should call billing and they could tell her what it was for, per pt she's never been to [facility] for any tests, I advised that we are a part of [facility], I will ask Aram Beecham or Asher Muir to look at this.

## 2011-10-11 NOTE — Telephone Encounter (Signed)
Will Forward to [facility]

## 2011-10-11 NOTE — Telephone Encounter (Signed)
Please call back She had some memory and recall issues on her recent exam The vitamin B12 level was on the low side so I wanted to see if taking a supplement would help her memory I would like her to continue this till her next visit when I will reassess her memory and recheck the vitamin B12 level

## 2011-10-28 ENCOUNTER — Other Ambulatory Visit: Payer: Self-pay | Admitting: Internal Medicine

## 2011-11-28 ENCOUNTER — Other Ambulatory Visit: Payer: Self-pay | Admitting: *Deleted

## 2011-11-28 MED ORDER — CLONAZEPAM 0.5 MG PO TABS: 0.5000 mg | ORAL_TABLET | Freq: Every evening | ORAL | Status: DC | PRN

## 2011-11-28 NOTE — Telephone Encounter (Signed)
Okay #30 x 0 

## 2011-11-28 NOTE — Telephone Encounter (Signed)
rx called into pharmacy

## 2011-11-28 NOTE — Telephone Encounter (Signed)
Patient called stating that the pharmacy told her to call the office for a refill on her Clonazepam. Pharmacy [facility]

## 2011-12-15 ENCOUNTER — Other Ambulatory Visit: Payer: Self-pay | Admitting: Internal Medicine

## 2012-01-26 ENCOUNTER — Other Ambulatory Visit: Payer: Self-pay | Admitting: Internal Medicine

## 2012-02-14 ENCOUNTER — Other Ambulatory Visit: Payer: Self-pay | Admitting: Internal Medicine

## 2012-02-15 ENCOUNTER — Ambulatory Visit (INDEPENDENT_AMBULATORY_CARE_PROVIDER_SITE_OTHER): Payer: Medicare Other | Admitting: Internal Medicine

## 2012-02-15 ENCOUNTER — Encounter: Payer: Self-pay | Admitting: Internal Medicine

## 2012-02-15 VITALS — BP 140/80 | HR 76 | Temp 98.1°F | Wt 138.0 lb

## 2012-02-15 DIAGNOSIS — E7800 Pure hypercholesterolemia, unspecified: Secondary | ICD-10-CM

## 2012-02-15 DIAGNOSIS — I10 Essential (primary) hypertension: Secondary | ICD-10-CM

## 2012-02-15 DIAGNOSIS — K5520 Angiodysplasia of colon without hemorrhage: Secondary | ICD-10-CM

## 2012-02-15 DIAGNOSIS — E119 Type 2 diabetes mellitus without complications: Secondary | ICD-10-CM

## 2012-02-15 NOTE — Assessment & Plan Note (Signed)
BP Readings from Last 3 Encounters:  02/15/12 140/80  08/17/11 156/79  02/11/11 140/60   Better today No changes needed Lab Results  Component Value Date   CREATININE 1.1 08/17/2011

## 2012-02-15 NOTE — Assessment & Plan Note (Signed)
Lab Results  Component Value Date   HGBA1C 6.8* 08/17/2011   Still seems to have good control Will recheck

## 2012-02-15 NOTE — Progress Notes (Signed)
Subjective:    Patient ID: Kathryn Ware, female    DOB: [birth year], 71 y.o.   MRN: [id number]  HPI Doing okay  Checks sugars about 3 times a week Usually 150-180 random No severe hypoglycemic reactions--occ feels shaky in early AM. Better after eating No numbness or pain in feet. Occ toe pain---relates to shoes  No chest pina No SOB No headaches No dizziness or sycnope Occ does some exercises---calisthenics and stretching  Still has part time job in customer service at [employer] has been fine No stomach problems No blood in stool or black stools  No changes in cognitive function or functional abilities No concerns about memory---still embarrased about the performance Will reassess next time  Current Outpatient Prescriptions on File Prior to Visit  Medication Sig Dispense Refill  . clonazePAM (KLONOPIN) 0.5 MG tablet Take 1 tablet (0.5 mg total) by mouth at bedtime as needed.  30 tablet  0  . ferrous sulfate 325 (65 FE) MG tablet Take 325 mg by mouth daily with breakfast.        . glipiZIDE (GLUCOTROL XL) 10 MG 24 hr tablet TAKE ONE TABLET BY MOUTH TWICE DAILY  60 tablet  6  . glucose blood (ONE TOUCH ULTRA TEST) test strip Use as instructed  50 each  12  . lisinopril (PRINIVIL,ZESTRIL) 40 MG tablet TAKE ONE TABLET BY MOUTH EVERY DAY  30 tablet  11  . metFORMIN (GLUCOPHAGE-XR) 500 MG 24 hr tablet TAKE ONE TABLET BY MOUTH TWICE DAILY  60 tablet  11  . omeprazole (PRILOSEC) 20 MG capsule TAKE ONE CAPSULE BY MOUTH DAILY  30 capsule  11  . simvastatin (ZOCOR) 80 MG tablet TAKE ONE TABLET BY MOUTH DAILY  30 tablet  11  . sucralfate (CARAFATE) 1 G tablet TAKE ONE TABLET BY MOUTH BEFORE MEALS AND AT BEDTIME  120 tablet  0    Allergies  Allergen Reactions  . Metformin     REACTION: diarrhea  . Nifedipine     REACTION: cough    Past Medical History  Diagnosis Date  . Diabetes mellitus type II   . HTN (hypertension)   . HLD (hyperlipidemia)   . RLS (restless legs  syndrome)   . GI AVM (gastrointestinal arteriovenous vascular malformation)     stomach    Past Surgical History  Procedure Date  . Abdominal hysterectomy 1976    RSO (fibroid)  . Top   . Abdominal adhesion surgery 11/2001    Dr. Katrinka Blazing  . Esophagogastroduodenoscopy 07/2006    multiple angioectasias  . Doppler echocardiography     EF 55%, mild MR, TR  . Dobutamine stress echo 11/10    normal  . Laryngeal polyp 10/2008    Dr. Jenne Campus    Family History  Problem Relation Age of Onset  . Stroke Father   . Hypertension Mother   . Hypertension      sibling  . Diabetes      grandmother    History   Social History  . Marital Status: Divorced    Spouse Name: N/A    Number of Children: 1  . Years of Education: N/A   Occupational History  . 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     Social History Main Topics  . Smoking status: Current Everyday Smoker  . Smokeless tobacco: Never Used  . Alcohol Use: No  . Drug Use: Not on file  . Sexually Active: Not on file  Other Topics Concern  . Not on file   Social History Narrative   No living willRequests son, [name], or sister, [name] as health care POAs.Would accept resuscitation Not sure about feeding tubes   Review of Systems Has chronic sleep problems--stressed at times. Tired today    Objective:   Physical Exam  Constitutional: She appears well-developed and well-nourished. No distress.  Neck: Normal range of motion. Neck supple.  Cardiovascular: Normal rate, regular rhythm, normal heart sounds and intact distal pulses.  Exam reveals no gallop.   No murmur heard. Pulmonary/Chest: Effort normal and breath sounds normal. No respiratory distress. She has no wheezes. She has no rales.  Lymphadenopathy:    She has no cervical adenopathy.  Skin: No rash noted.       Some mycotic toenails but no lesions  Psychiatric: She has a normal mood and affect. Her behavior is normal.            Assessment & Plan:

## 2012-02-15 NOTE — Assessment & Plan Note (Signed)
Lab Results  Component Value Date   LDLCALC 95 08/17/2011   At goal  Continue meds

## 2012-02-15 NOTE — Assessment & Plan Note (Signed)
No signs of bleeding Continue GI meds

## 2012-02-16 ENCOUNTER — Encounter: Payer: Self-pay | Admitting: *Deleted

## 2012-07-21 ENCOUNTER — Other Ambulatory Visit: Payer: Self-pay | Admitting: Internal Medicine

## 2012-08-10 ENCOUNTER — Other Ambulatory Visit: Payer: Self-pay | Admitting: Internal Medicine

## 2012-08-16 ENCOUNTER — Encounter: Payer: Self-pay | Admitting: Internal Medicine

## 2012-08-30 ENCOUNTER — Other Ambulatory Visit: Payer: Self-pay | Admitting: Internal Medicine

## 2012-10-10 ENCOUNTER — Ambulatory Visit (INDEPENDENT_AMBULATORY_CARE_PROVIDER_SITE_OTHER): Payer: Medicare Other | Admitting: Internal Medicine

## 2012-10-10 ENCOUNTER — Encounter: Payer: Self-pay | Admitting: Internal Medicine

## 2012-10-10 VITALS — BP 140/70 | HR 81 | Temp 98.3°F | Ht 63.0 in | Wt 133.0 lb

## 2012-10-10 DIAGNOSIS — E7800 Pure hypercholesterolemia, unspecified: Secondary | ICD-10-CM

## 2012-10-10 DIAGNOSIS — E119 Type 2 diabetes mellitus without complications: Secondary | ICD-10-CM

## 2012-10-10 DIAGNOSIS — Z0000 Encounter for general adult medical examination without abnormal findings: Secondary | ICD-10-CM

## 2012-10-10 DIAGNOSIS — G3184 Mild cognitive impairment, so stated: Secondary | ICD-10-CM

## 2012-10-10 DIAGNOSIS — I10 Essential (primary) hypertension: Secondary | ICD-10-CM

## 2012-10-10 LAB — HEPATIC FUNCTION PANEL
ALT: 12 U/L (ref 0–35)
AST: 18 U/L (ref 0–37)
Albumin: 4.3 g/dL (ref 3.5–5.2)
Alkaline Phosphatase: 45 U/L (ref 39–117)
Bilirubin, Direct: 0 mg/dL (ref 0.0–0.3)
Total Protein: 8 g/dL (ref 6.0–8.3)

## 2012-10-10 LAB — HEMOGLOBIN A1C: Hgb A1c MFr Bld: 7.5 % — ABNORMAL HIGH (ref 4.6–6.5)

## 2012-10-10 LAB — CBC WITH DIFFERENTIAL/PLATELET
Basophils Relative: 0.3 % (ref 0.0–3.0)
Eosinophils Relative: 1.3 % (ref 0.0–5.0)
HCT: 37.9 % (ref 36.0–46.0)
Hemoglobin: 12.2 g/dL (ref 12.0–15.0)
Lymphocytes Relative: 24.7 % (ref 12.0–46.0)
Lymphs Abs: 1.6 10*3/uL (ref 0.7–4.0)
Monocytes Relative: 9.2 % (ref 3.0–12.0)
Neutro Abs: 4.2 10*3/uL (ref 1.4–7.7)
RBC: 4.53 Mil/uL (ref 3.87–5.11)
RDW: 14.6 % (ref 11.5–14.6)
WBC: 6.5 10*3/uL (ref 4.5–10.5)

## 2012-10-10 LAB — LIPID PANEL
Total CHOL/HDL Ratio: 5
VLDL: 30.8 mg/dL (ref 0.0–40.0)

## 2012-10-10 LAB — BASIC METABOLIC PANEL
Calcium: 10.1 mg/dL (ref 8.4–10.5)
GFR: 70.36 mL/min (ref >60.00)
Glucose, Bld: 112 mg/dL — ABNORMAL HIGH (ref 70–99)
Potassium: 4.1 mEq/L (ref 3.5–5.1)
Sodium: 141 mEq/L (ref 135–145)

## 2012-10-10 NOTE — Progress Notes (Signed)
Subjective:    Patient ID: Kathryn Ware, female    DOB: 09-21-1941, 71 y.o.   MRN: [id number]  HPI Here for physical Discussed cigarette smoking---still 1 PPD. Urged her to stop Counseled on patch Gave info on 1-800 QUIT NOW  Checks sugars 2-3 times per week Fasting 120-150 Tries to watch diet Weight down a few pounds No hypoglycemic spells  Current Outpatient Prescriptions on File Prior to Visit  Medication Sig Dispense Refill  . clonazePAM (KLONOPIN) 0.5 MG tablet Take 1 tablet (0.5 mg total) by mouth at bedtime as needed.  30 tablet  0  . ferrous sulfate 325 (65 FE) MG tablet Take 325 mg by mouth daily with breakfast.        . glipiZIDE (GLUCOTROL XL) 10 MG 24 hr tablet TAKE ONE TABLET BY MOUTH TWICE DAILY  60 tablet  6  . lisinopril (PRINIVIL,ZESTRIL) 40 MG tablet TAKE ONE TABLET BY MOUTH EVERY DAY  30 tablet  11  . metFORMIN (GLUCOPHAGE-XR) 500 MG 24 hr tablet TAKE ONE TABLET BY MOUTH TWICE DAILY  60 tablet  11  . omeprazole (PRILOSEC) 20 MG capsule TAKE ONE CAPSULE BY MOUTH DAILY  30 capsule  11  . simvastatin (ZOCOR) 80 MG tablet TAKE ONE TABLET BY MOUTH DAILY  30 tablet  11  . sucralfate (CARAFATE) 1 G tablet TAKE ONE TABLET BY MOUTH BEFORE MEALS AND AT BEDTIME  120 tablet  0    Allergies  Allergen Reactions  . Metformin     REACTION: diarrhea  . Nifedipine     REACTION: cough    Past Medical History  Diagnosis Date  . Diabetes mellitus type II   . HTN (hypertension)   . HLD (hyperlipidemia)   . RLS (restless legs syndrome)   . GI AVM (gastrointestinal arteriovenous vascular malformation)     stomach    Past Surgical History  Procedure Date  . Abdominal hysterectomy 1976    RSO (fibroid)  . Top   . Abdominal adhesion surgery 11/2001    Dr. Katrinka Blazing  . Esophagogastroduodenoscopy 07/2006    multiple angioectasias  . Doppler echocardiography     EF 55%, mild MR, TR  . Dobutamine stress echo 11/10    normal  . Laryngeal polyp 10/2008    Dr. Jenne Campus     Family History  Problem Relation Age of Onset  . Stroke Father   . Hypertension Mother   . Hypertension      sibling  . Diabetes      grandmother    History   Social History  . Marital Status: Divorced    Spouse Name: N/A    Number of Children: 1  . Years of Education: N/A   Occupational History  . 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     Social History Main Topics  . Smoking status: Current Every Day Smoker  . Smokeless tobacco: Never Used  . Alcohol Use: No  . Drug Use: Not on file  . Sexually Active: Not on file   Other Topics Concern  . Not on file   Social History Narrative   No living willRequests son, Kathryn Ware, or sister, Kathryn Ware as health care POAs.Would accept resuscitation Not sure about feeding tubes   Review of Systems  Constitutional: Negative for fatigue.       Still works part time Wears seat belt  HENT: Positive for congestion and rhinorrhea. Negative for hearing loss, dental problem and tinnitus.  Having a cold now--mild Full dentures--some fit issues  Eyes: Negative for visual disturbance.       No diplopia or unilateral vision loss  Respiratory: Negative for cough, chest tightness and shortness of breath.   Cardiovascular: Negative for chest pain, palpitations and leg swelling.  Gastrointestinal: Negative for nausea, vomiting, abdominal pain, constipation and blood in stool.       No heartburn  Genitourinary: Negative for urgency, frequency and difficulty urinating.       No sexual problems  Musculoskeletal: Negative for back pain, joint swelling and arthralgias.  Skin: Negative for rash.       No suspicious lesions  Neurological: Positive for headaches. Negative for dizziness, syncope, weakness, light-headedness and numbness.       Only occ headache--with stress. Doesn't need meds in general  Hematological: Negative for adenopathy. Does not bruise/bleed easily.  Psychiatric/Behavioral: Negative for sleep  disturbance and dysphoric mood. The patient is not nervous/anxious.        Objective:   Physical Exam  Constitutional: She is oriented to person, place, and time. She appears well-developed and well-nourished. No distress.  HENT:  Head: Normocephalic and atraumatic.  Right Ear: External ear normal.  Left Ear: External ear normal.  Mouth/Throat: Oropharynx is clear and moist. No oropharyngeal exudate.  Eyes: Conjunctivae normal and EOM are normal. Pupils are equal, round, and reactive to light.  Neck: Normal range of motion. Neck supple. No thyromegaly present.  Cardiovascular: Normal rate, regular rhythm, normal heart sounds and intact distal pulses.  Exam reveals no gallop.   No murmur heard. Pulmonary/Chest: Effort normal and breath sounds normal. No respiratory distress. She has no wheezes. She has no rales.  Abdominal: Soft. There is no tenderness.  Genitourinary:       No breast masses or discharge  Musculoskeletal: She exhibits no edema and no tenderness.  Lymphadenopathy:    She has no cervical adenopathy.  Neurological: She is alert and oriented to person, place, and time.       President-- "Obama, Bush, the other Bush,  .... The one with the wife" 100-94-.Marland Kitchen Not good with numbers D-l-r-o-w Recall 1/3  Skin: No rash noted. No erythema.  Psychiatric: She has a normal mood and affect. Her behavior is normal.          Assessment & Plan:

## 2012-10-10 NOTE — Assessment & Plan Note (Signed)
Mild memory issues that have not worsened over the past year

## 2012-10-10 NOTE — Assessment & Plan Note (Signed)
No problems with med Labs today

## 2012-10-10 NOTE — Assessment & Plan Note (Signed)
Hopefully still acceptable control Will check labs 

## 2012-10-10 NOTE — Assessment & Plan Note (Signed)
Doing well Not interested in zostavax Will do mammogram every other year--due 2014

## 2012-10-10 NOTE — Assessment & Plan Note (Signed)
BP Readings from Last 3 Encounters:  10/10/12 140/70  02/15/12 140/80  08/17/11 156/79   Good control No changes needed

## 2012-10-15 ENCOUNTER — Encounter: Payer: Self-pay | Admitting: *Deleted

## 2012-10-26 ENCOUNTER — Other Ambulatory Visit: Payer: Self-pay | Admitting: Internal Medicine

## 2012-11-26 ENCOUNTER — Other Ambulatory Visit: Payer: Self-pay | Admitting: Internal Medicine

## 2013-01-16 ENCOUNTER — Other Ambulatory Visit: Payer: Self-pay | Admitting: Internal Medicine

## 2013-02-13 ENCOUNTER — Other Ambulatory Visit: Payer: Self-pay | Admitting: Internal Medicine

## 2013-03-21 ENCOUNTER — Other Ambulatory Visit: Payer: Self-pay | Admitting: Internal Medicine

## 2013-04-15 ENCOUNTER — Encounter: Payer: Self-pay | Admitting: Radiology

## 2013-04-16 ENCOUNTER — Encounter: Payer: Self-pay | Admitting: Internal Medicine

## 2013-04-16 ENCOUNTER — Ambulatory Visit (INDEPENDENT_AMBULATORY_CARE_PROVIDER_SITE_OTHER): Payer: Medicare Other | Admitting: Internal Medicine

## 2013-04-16 VITALS — BP 142/60 | HR 89 | Temp 98.6°F | Wt 133.0 lb

## 2013-04-16 DIAGNOSIS — E1149 Type 2 diabetes mellitus with other diabetic neurological complication: Secondary | ICD-10-CM

## 2013-04-16 DIAGNOSIS — E7800 Pure hypercholesterolemia, unspecified: Secondary | ICD-10-CM

## 2013-04-16 DIAGNOSIS — I10 Essential (primary) hypertension: Secondary | ICD-10-CM

## 2013-04-16 LAB — HEMOGLOBIN A1C: Hgb A1c MFr Bld: 7 % — ABNORMAL HIGH (ref 4.6–6.5)

## 2013-04-16 NOTE — Assessment & Plan Note (Signed)
No problems with statin Will continue 

## 2013-04-16 NOTE — Assessment & Plan Note (Signed)
BP Readings from Last 3 Encounters:  04/16/13 142/60  10/10/12 140/70  02/15/12 140/80   Not quite at goal Will work on fitness No new meds for now

## 2013-04-16 NOTE — Patient Instructions (Signed)
Please try walking most days for 30-45 minutes or more

## 2013-04-16 NOTE — Progress Notes (Signed)
Subjective:    Patient ID: Kathryn Ware, female    DOB: 09-09-1941, 72 y.o.   MRN: [id number]  HPI Doing okay Laid off of part time job---hopes to go back or get something else  Still smoking Just not up for quitting  Checks sugars 2-3 times per week Fasting usually under 120 No hypoglycemic spells Tingling in feet--no sores or pain  No chest pain No SOB Does some calisthenics and in garden No real aerobic exercise---discussed walking  No GI bleeding lately (no dark stools,etc) No change in mild memory problems  Current Outpatient Prescriptions on File Prior to Visit  Medication Sig Dispense Refill  . clonazePAM (KLONOPIN) 0.5 MG tablet Take 1 tablet (0.5 mg total) by mouth at bedtime as needed.  30 tablet  0  . ferrous sulfate 325 (65 FE) MG tablet Take 325 mg by mouth daily with breakfast.        . glipiZIDE (GLUCOTROL XL) 10 MG 24 hr tablet TAKE 1 TABLET BY MOUTH TWICE DAILY  60 tablet  11  . glucose blood (ONE TOUCH ULTRA TEST) test strip Use as directed to test blood sugar once daily dx: 250.00  100 each  3  . lisinopril (PRINIVIL,ZESTRIL) 40 MG tablet TAKE ONE TABLET BY MOUTH EVERY DAY  30 tablet  11  . metFORMIN (GLUCOPHAGE-XR) 500 MG 24 hr tablet TAKE ONE TABLET BY MOUTH TWICE DAILY  60 tablet  11  . omeprazole (PRILOSEC) 20 MG capsule TAKE ONE CAPSULE BY MOUTH EVERY DAY  90 capsule  3  . simvastatin (ZOCOR) 80 MG tablet TAKE ONE TABLET BY MOUTH DAILY  30 tablet  11  . sucralfate (CARAFATE) 1 G tablet TAKE ONE TABLET BY MOUTH BEFORE MEALS AND AT BEDTIME  120 tablet  0   No current facility-administered medications on file prior to visit.    Allergies  Allergen Reactions  . Metformin     REACTION: diarrhea  . Nifedipine     REACTION: cough    Past Medical History  Diagnosis Date  . Diabetes mellitus type II   . HTN (hypertension)   . HLD (hyperlipidemia)   . RLS (restless legs syndrome)   . GI AVM (gastrointestinal arteriovenous vascular malformation)      stomach    Past Surgical History  Procedure Laterality Date  . Abdominal hysterectomy  1976    RSO (fibroid)  . Top    . Abdominal adhesion surgery  11/2001    Dr. Katrinka Blazing  . Esophagogastroduodenoscopy  07/2006    multiple angioectasias  . Doppler echocardiography      EF 55%, mild MR, TR  . Dobutamine stress echo  11/10    normal  . Laryngeal polyp  10/2008    Dr. Jenne Campus    Family History  Problem Relation Age of Onset  . Stroke Father   . Hypertension Mother   . Hypertension      sibling  . Diabetes      grandmother    History   Social History  . Marital Status: Divorced    Spouse Name: N/A    Number of Children: 1  . Years of Education: N/A   Occupational History  . 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 Smoking status: Current Every Day Smoker  . Smokeless tobacco: Never Used  . Alcohol Use: No  . Drug Use: Not on file  . Sexually Active: Not on  file   Other Topics Concern  . Not on file   Social History Narrative   No living will   Requests son, Kathryn Ware, or sister, Kathryn Ware as health care POAs.   Would accept resuscitation    Not sure about feeding tubes     Review of Systems Appetite is fine Weight stable Sleeps well No mood problems    Objective:   Physical Exam  Constitutional: She appears well-developed and well-nourished. No distress.  Neck: Normal range of motion. Neck supple. No thyromegaly present.  Cardiovascular: Normal rate, regular rhythm, normal heart sounds and intact distal pulses.  Exam reveals no gallop.   No murmur heard. Pulmonary/Chest: Effort normal and breath sounds normal. No respiratory distress. She has no wheezes. She has no rales.  Musculoskeletal: She exhibits no edema and no tenderness.  Lymphadenopathy:    She has no cervical adenopathy.  Skin: No rash noted. No erythema.  No foot lesions  Psychiatric: She has a normal mood and affect.           Assessment & Plan:

## 2013-04-16 NOTE — Assessment & Plan Note (Signed)
Early neuropathy Seems to have reasonable control Discussed fitness

## 2013-04-17 ENCOUNTER — Encounter: Payer: Self-pay | Admitting: *Deleted

## 2013-08-15 ENCOUNTER — Encounter: Payer: Self-pay | Admitting: Internal Medicine

## 2013-09-06 ENCOUNTER — Other Ambulatory Visit: Payer: Self-pay | Admitting: Internal Medicine

## 2013-10-29 ENCOUNTER — Encounter: Payer: Self-pay | Admitting: Internal Medicine

## 2013-10-29 ENCOUNTER — Ambulatory Visit (INDEPENDENT_AMBULATORY_CARE_PROVIDER_SITE_OTHER): Payer: Medicare Other | Admitting: Internal Medicine

## 2013-10-29 VITALS — BP 140/70 | HR 75 | Temp 97.5°F | Ht 63.0 in | Wt 131.0 lb

## 2013-10-29 DIAGNOSIS — F17200 Nicotine dependence, unspecified, uncomplicated: Secondary | ICD-10-CM

## 2013-10-29 DIAGNOSIS — E1149 Type 2 diabetes mellitus with other diabetic neurological complication: Secondary | ICD-10-CM

## 2013-10-29 DIAGNOSIS — E7800 Pure hypercholesterolemia, unspecified: Secondary | ICD-10-CM

## 2013-10-29 DIAGNOSIS — I10 Essential (primary) hypertension: Secondary | ICD-10-CM

## 2013-10-29 DIAGNOSIS — E1142 Type 2 diabetes mellitus with diabetic polyneuropathy: Secondary | ICD-10-CM

## 2013-10-29 DIAGNOSIS — Z0000 Encounter for general adult medical examination without abnormal findings: Secondary | ICD-10-CM

## 2013-10-29 LAB — CBC WITH DIFFERENTIAL/PLATELET
Basophils Absolute: 0 10*3/uL (ref 0.0–0.1)
Basophils Relative: 0.2 % (ref 0.0–3.0)
EOS ABS: 0 10*3/uL (ref 0.0–0.7)
Eosinophils Relative: 0.1 % (ref 0.0–5.0)
HCT: 38.2 % (ref 36.0–46.0)
Hemoglobin: 12.3 g/dL (ref 12.0–15.0)
Lymphocytes Relative: 23.8 % (ref 12.0–46.0)
Lymphs Abs: 1.6 10*3/uL (ref 0.7–4.0)
MCHC: 32.1 g/dL (ref 30.0–36.0)
MCV: 82.6 fl (ref 78.0–100.0)
MONO ABS: 0.6 10*3/uL (ref 0.1–1.0)
Monocytes Relative: 8.5 % (ref 3.0–12.0)
NEUTROS PCT: 67.4 % (ref 43.0–77.0)
Neutro Abs: 4.6 10*3/uL (ref 1.4–7.7)
PLATELETS: 267 10*3/uL (ref 150.0–400.0)
RBC: 4.63 Mil/uL (ref 3.87–5.11)
RDW: 13.6 % (ref 11.5–14.6)
WBC: 6.8 10*3/uL (ref 4.5–10.5)

## 2013-10-29 LAB — LIPID PANEL
CHOL/HDL RATIO: 4
CHOLESTEROL: 179 mg/dL (ref 0–200)
HDL: 43.1 mg/dL (ref >39.00)
LDL Cholesterol: 121 mg/dL — ABNORMAL HIGH (ref 0–99)
TRIGLYCERIDES: 76 mg/dL (ref 0.0–149.0)
VLDL: 15.2 mg/dL (ref 0.0–40.0)

## 2013-10-29 LAB — BASIC METABOLIC PANEL
BUN: 10 mg/dL (ref 6–23)
CHLORIDE: 104 meq/L (ref 96–112)
CO2: 27 meq/L (ref 19–32)
CREATININE: 1 mg/dL (ref 0.4–1.2)
Calcium: 9.9 mg/dL (ref 8.4–10.5)
GFR: 69.35 mL/min (ref >60.00)
GLUCOSE: 108 mg/dL — AB (ref 70–99)
POTASSIUM: 4 meq/L (ref 3.5–5.1)
Sodium: 139 mEq/L (ref 135–145)

## 2013-10-29 LAB — HM DIABETES FOOT EXAM

## 2013-10-29 LAB — HEPATIC FUNCTION PANEL
ALT: 14 U/L (ref 0–35)
AST: 19 U/L (ref 0–37)
Albumin: 4.5 g/dL (ref 3.5–5.2)
Alkaline Phosphatase: 49 U/L (ref 39–117)
Bilirubin, Direct: 0.1 mg/dL (ref 0.0–0.3)
Total Bilirubin: 0.6 mg/dL (ref 0.3–1.2)
Total Protein: 8 g/dL (ref 6.0–8.3)

## 2013-10-29 LAB — HEMOGLOBIN A1C: HEMOGLOBIN A1C: 7.7 % — AB (ref 4.6–6.5)

## 2013-10-29 LAB — TSH: TSH: 0.91 u[IU]/mL (ref 0.35–5.50)

## 2013-10-29 LAB — T4, FREE: Free T4: 0.77 ng/dL (ref 0.60–1.60)

## 2013-10-29 NOTE — Progress Notes (Signed)
Pre-visit discussion using our clinic review tool. No additional management support is needed unless otherwise documented below in the visit note.  

## 2013-10-29 NOTE — Assessment & Plan Note (Signed)
Seems to have good control Due for labs

## 2013-10-29 NOTE — Assessment & Plan Note (Signed)
Counseled and info given

## 2013-10-29 NOTE — Progress Notes (Signed)
Subjective:    Patient ID: Kathryn Ware, female    DOB: 15-Jan-1941, 73 y.o.   MRN: [id number]  HPI Here for physical Doing well No falls No problems with memory-- no functional changes Some mood issues revolving around sick sister--living with her  Checks sugars 3 times per week Usually under 130 No hypoglycemic reactions No pain or sores in feet  No chest pain No SOB  Current Outpatient Prescriptions on File Prior to Visit  Medication Sig Dispense Refill  . clonazePAM (KLONOPIN) 0.5 MG tablet Take 1 tablet (0.5 mg total) by mouth at bedtime as needed.  30 tablet  0  . ferrous sulfate 325 (65 FE) MG tablet Take 325 mg by mouth daily with breakfast.        . glipiZIDE (GLUCOTROL XL) 10 MG 24 hr tablet TAKE 1 TABLET BY MOUTH TWICE DAILY  60 tablet  11  . glucose blood (ONE TOUCH ULTRA TEST) test strip Use as directed to test blood sugar once daily dx: 250.00  100 each  3  . lisinopril (PRINIVIL,ZESTRIL) 40 MG tablet TAKE ONE TABLET BY MOUTH EVERY DAY  30 tablet  11  . metFORMIN (GLUCOPHAGE-XR) 500 MG 24 hr tablet TAKE ONE TABLET BY MOUTH TWICE DAILY  60 tablet  11  . omeprazole (PRILOSEC) 20 MG capsule TAKE ONE CAPSULE BY MOUTH EVERY DAY  90 capsule  3  . simvastatin (ZOCOR) 80 MG tablet TAKE 1 TABLET BY MOUTH EVERY DAY  90 tablet  3  . sucralfate (CARAFATE) 1 G tablet TAKE ONE TABLET BY MOUTH BEFORE MEALS AND AT BEDTIME  120 tablet  0   No current facility-administered medications on file prior to visit.    Allergies  Allergen Reactions  . Metformin     REACTION: diarrhea  . Nifedipine     REACTION: cough    Past Medical History  Diagnosis Date  . Diabetes mellitus type II   . HTN (hypertension)   . HLD (hyperlipidemia)   . RLS (restless legs syndrome)   . GI AVM (gastrointestinal arteriovenous vascular malformation)     stomach    Past Surgical History  Procedure Laterality Date  . Abdominal hysterectomy  1976    RSO (fibroid)  . Top    . Abdominal adhesion  surgery  11/2001    Dr. Tamala Julian  . Esophagogastroduodenoscopy  07/2006    multiple angioectasias  . Doppler echocardiography      EF 55%, mild MR, TR  . Dobutamine stress echo  11/10    normal  . Laryngeal polyp  10/2008    Dr. Tami Ribas    Family History  Problem Relation Age of Onset  . Stroke Father   . Hypertension Mother   . Hypertension      sibling  . Diabetes      grandmother    History   Social History  . Marital Status: Divorced    Spouse Name: N/A    Number of Children: 1  . Years of Education: N/A   Occupational History  . 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 Smoking status: Current Every Day Smoker  . Smokeless tobacco: Never Used  . Alcohol Use: No  . Drug Use: Not on file  . Sexual Activity: Not on file   Other Topics Concern  . Not on file   Social History Narrative   No living will   Requests son, Orson Gear,  or sister, Gretta Cool as health care [place].   Would accept resuscitation    Not sure about feeding tubes   Review of Systems  Constitutional: Negative for fatigue and unexpected weight change.       Wears seat belt  HENT: Positive for rhinorrhea and tinnitus. Negative for dental problem and hearing loss.        Full dentures  Eyes: Negative for visual disturbance.       UTD with diabetic exam  Respiratory: Negative for cough, chest tightness and shortness of breath.   Cardiovascular: Negative for chest pain, palpitations and leg swelling.  Gastrointestinal: Negative for nausea, vomiting, abdominal pain, constipation and blood in stool.       No heartburn  Endocrine: Negative for cold intolerance and heat intolerance.  Genitourinary: Negative for dysuria, hematuria and difficulty urinating.  Musculoskeletal: Negative for arthralgias, back pain and joint swelling.  Skin: Negative for rash.       No suspicious lesions  Allergic/Immunologic: Negative for environmental allergies and  immunocompromised state.  Neurological: Positive for headaches. Negative for dizziness, syncope, weakness and light-headedness.       Occ headaches  Hematological: Negative for adenopathy. Does not bruise/bleed easily.  Psychiatric/Behavioral: Positive for sleep disturbance. Negative for dysphoric mood. The patient is not nervous/anxious.        Occ sleep problem--rarely uses the clonazepam       Objective:   Physical Exam  Constitutional: She is oriented to person, place, and time. She appears well-developed and well-nourished. No distress.  HENT:  Head: Normocephalic and atraumatic.  Right Ear: External ear normal.  Left Ear: External ear normal.  Mouth/Throat: Oropharynx is clear and moist. No oropharyngeal exudate.  Eyes: Conjunctivae and EOM are normal. Pupils are equal, round, and reactive to light.  Neck: Normal range of motion. Neck supple. No thyromegaly present.  Cardiovascular: Normal rate, regular rhythm, normal heart sounds and intact distal pulses.  Exam reveals no gallop.   No murmur heard. Pulmonary/Chest: Effort normal and breath sounds normal. No respiratory distress. She has no wheezes. She has no rales.  Abdominal: Soft. There is no tenderness.  Musculoskeletal: She exhibits no edema and no tenderness.  Lymphadenopathy:    She has no cervical adenopathy.    She has no axillary adenopathy.  Neurological: She is alert and oriented to person, place, and time.  Slightly decreased sensation in feet  Skin: No rash noted. No erythema.  No foot lesions  Psychiatric: She has a normal mood and affect. Her behavior is normal.          Assessment & Plan:

## 2013-10-29 NOTE — Assessment & Plan Note (Signed)
Generally healthy Due for mammogram

## 2013-10-29 NOTE — Assessment & Plan Note (Signed)
Mild No Rx needed 

## 2013-10-29 NOTE — Patient Instructions (Addendum)
Please set up your screening mammogram.  Smoking Cessation  Call 1-800 QUIT NOW for more help Quitting smoking is important to your health and has many advantages. However, it is not always easy to quit since nicotine is a very addictive drug. Often times, people try 3 times or more before being able to quit. This document explains the best ways for you to prepare to quit smoking. Quitting takes hard work and a lot of effort, but you can do it. ADVANTAGES OF QUITTING SMOKING  You will live longer, feel better, and live better.  Your body will feel the impact of quitting smoking almost immediately.  Within 20 minutes, blood pressure decreases. Your pulse returns to its normal level.  After 8 hours, carbon monoxide levels in the blood return to normal. Your oxygen level increases.  After 24 hours, the chance of having a heart attack starts to decrease. Your breath, hair, and body stop smelling like smoke.  After 48 hours, damaged nerve endings begin to recover. Your sense of taste and smell improve.  After 72 hours, the body is virtually free of nicotine. Your bronchial tubes relax and breathing becomes easier.  After 2 to 12 weeks, lungs can hold more air. Exercise becomes easier and circulation improves.  The risk of having a heart attack, stroke, cancer, or lung disease is greatly reduced.  After 1 year, the risk of coronary heart disease is cut in half.  After 5 years, the risk of stroke falls to the same as a nonsmoker.  After 10 years, the risk of lung cancer is cut in half and the risk of other cancers decreases significantly.  After 15 years, the risk of coronary heart disease drops, usually to the level of a nonsmoker.  If you are pregnant, quitting smoking will improve your chances of having a healthy baby.  The people you live with, especially any children, will be healthier.  You will have extra money to spend on things other than cigarettes. QUESTIONS TO THINK ABOUT  BEFORE ATTEMPTING TO QUIT You may want to talk about your answers with your caregiver.  Why do you want to quit?  If you tried to quit in the past, what helped and what did not?  What will be the most difficult situations for you after you quit? How will you plan to handle them?  Who can help you through the tough times? Your family? Friends? A caregiver?  What pleasures do you get from smoking? What ways can you still get pleasure if you quit? Here are some questions to ask your caregiver:  How can you help me to be successful at quitting?  What medicine do you think would be best for me and how should I take it?  What should I do if I need more help?  What is smoking withdrawal like? How can I get information on withdrawal? GET READY  Set a quit date.  Change your environment by getting rid of all cigarettes, ashtrays, matches, and lighters in your home, car, or work. Do not let people smoke in your home.  Review your past attempts to quit. Think about what worked and what did not. GET SUPPORT AND ENCOURAGEMENT You have a better chance of being successful if you have help. You can get support in many ways.  Tell your family, friends, and co-workers that you are going to quit and need their support. Ask them not to smoke around you.  Get individual, group, or telephone counseling and support.  Programs are available at [employer] and health centers. Call your local health department for information about programs in your area.  Spiritual beliefs and practices may help some smokers quit.  Download a "quit meter" on your computer to keep track of quit statistics, such as how long you have gone without smoking, cigarettes not smoked, and money saved.  Get a self-help book about quitting smoking and staying off of tobacco. [place] yourself from urges to smoke. Talk to someone, go for a walk, or occupy your time with a task.  Change your  normal routine. Take a different route to work. Drink tea instead of coffee. Eat breakfast in a different place.  Reduce your stress. Take a hot bath, exercise, or read a book.  Plan something enjoyable to do every day. Reward yourself for not smoking.  Explore interactive web-based programs that specialize in helping you quit. GET MEDICINE AND USE IT CORRECTLY Medicines can help you stop smoking and decrease the urge to smoke. Combining medicine with the above behavioral methods and support can greatly increase your chances of successfully quitting smoking.  Nicotine replacement therapy helps deliver nicotine to your body without the negative effects and risks of smoking. Nicotine replacement therapy includes nicotine gum, lozenges, inhalers, nasal sprays, and skin patches. Some may be available over-the-counter and others require a prescription.  Antidepressant medicine helps people abstain from smoking, but how this works is unknown. This medicine is available by prescription.  Nicotinic receptor partial agonist medicine simulates the effect of nicotine in your brain. This medicine is available by prescription. Ask your caregiver for advice about which medicines to use and how to use them based on your health history. Your caregiver will tell you what side effects to look out for if you choose to be on a medicine or therapy. Carefully read the information on the package. Do not use any other product containing nicotine while using a nicotine replacement product.  RELAPSE OR DIFFICULT SITUATIONS Most relapses occur within the first 3 months after quitting. Do not be discouraged if you start smoking again. Remember, most people try several times before finally quitting. You may have symptoms of withdrawal because your body is used to nicotine. You may crave cigarettes, be irritable, feel very hungry, cough often, get headaches, or have difficulty concentrating. The withdrawal symptoms are only  temporary. They are strongest when you first quit, but they will go away within 10 14 days. To reduce the chances of relapse, try to:  Avoid drinking alcohol. Drinking lowers your chances of successfully quitting.  Reduce the amount of caffeine you consume. Once you quit smoking, the amount of caffeine in your body increases and can give you symptoms, such as a rapid heartbeat, sweating, and anxiety.  Avoid smokers because they can make you want to smoke.  Do not let weight gain distract you. Many smokers will gain weight when they quit, usually less than 10 pounds. Eat a healthy diet and stay active. You can always lose the weight gained after you quit.  Find ways to improve your mood other than smoking. FOR MORE INFORMATION  www.smokefree.gov  Document Released: 10/04/2001 Document Revised: 04/10/2012 Document Reviewed: 01/19/2012 [facility] Patient Information 2014 [place], [place].   Diabetes and Exercise Exercising regularly is important. It is not just about losing weight. It has many health benefits, such as:  Improving your overall fitness, flexibility, and endurance.  Increasing your bone density.  Helping with weight control.  Decreasing  your body fat.  Increasing your muscle strength.  Reducing stress and tension.  Improving your overall health. People with diabetes who exercise gain additional benefits because exercise:  Reduces appetite.  Improves the body's use of blood sugar (glucose).  Helps lower or control blood glucose.  Decreases blood pressure.  Helps control blood lipids (such as cholesterol and triglycerides).  Improves the body's use of the hormone insulin by:  Increasing the body's insulin sensitivity.  Reducing the body's insulin needs.  Decreases the risk for heart disease because exercising:  Lowers cholesterol and triglycerides levels.  Increases the levels of good cholesterol (such as high-density lipoproteins [HDL]) in the  body.  Lowers blood glucose levels. YOUR ACTIVITY PLAN  Choose an activity that you enjoy and set realistic goals. Your health care provider or diabetes educator can help you make an activity plan that works for you. You can break activities into 2 or 3 sessions throughout the day. Doing so is as good as one long session. Exercise ideas include:  Taking the dog for a walk.  Taking the stairs instead of the elevator.  Dancing to your favorite song.  Doing your favorite exercise with a friend. RECOMMENDATIONS FOR EXERCISING WITH TYPE 1 OR TYPE 2 DIABETES   Check your blood glucose before exercising. If blood glucose levels are greater than 240 mg/dL, check for urine ketones. Do not exercise if ketones are present.  Avoid injecting insulin into areas of the body that are going to be exercised. For example, avoid injecting insulin into:  The arms when playing tennis.  The legs when jogging.  Keep a record of:  Food intake before and after you exercise.  Expected peak times of insulin action.  Blood glucose levels before and after you exercise.  The type and amount of exercise you have done.  Review your records with your health care provider. Your health care provider will help you to develop guidelines for adjusting food intake and insulin amounts before and after exercising.  If you take insulin or oral hypoglycemic agents, watch for signs and symptoms of hypoglycemia. They include:  Dizziness.  Shaking.  Sweating.  Chills.  Confusion.  Drink plenty of water while you exercise to prevent dehydration or heat stroke. Body water is lost during exercise and must be replaced.  Talk to your health care provider before starting an exercise program to make sure it is safe for you. Remember, almost any type of activity is better than none. Document Released: 12/31/2003 Document Revised: 06/12/2013 Document Reviewed: 03/19/2013 [facility] Patient Information 2014 [place].

## 2013-10-29 NOTE — Assessment & Plan Note (Signed)
BP Readings from Last 3 Encounters:  10/29/13 140/70  04/16/13 142/60  10/10/12 140/70   Reasonable control

## 2013-10-29 NOTE — Assessment & Plan Note (Signed)
Doing well on this statin Will continue

## 2013-10-30 ENCOUNTER — Telehealth: Payer: Self-pay

## 2013-10-30 NOTE — Telephone Encounter (Signed)
Relevant patient education mailed to patient.  

## 2013-10-31 ENCOUNTER — Other Ambulatory Visit: Payer: Self-pay | Admitting: Internal Medicine

## 2013-11-15 ENCOUNTER — Other Ambulatory Visit: Payer: Self-pay | Admitting: *Deleted

## 2013-11-15 MED ORDER — OMEPRAZOLE 20 MG PO CPDR: 20.0000 mg | DELAYED_RELEASE_CAPSULE | Freq: Every day | ORAL | Status: DC

## 2013-11-22 ENCOUNTER — Telehealth: Payer: Self-pay | Admitting: Internal Medicine

## 2013-11-27 NOTE — Telephone Encounter (Signed)
Relevant patient education mailed to patient.  

## 2013-12-22 DIAGNOSIS — Z9289 Personal history of other medical treatment: Secondary | ICD-10-CM

## 2013-12-22 HISTORY — DX: Personal history of other medical treatment: Z92.89

## 2014-01-05 ENCOUNTER — Observation Stay: Payer: Self-pay | Admitting: Internal Medicine

## 2014-01-05 DIAGNOSIS — I359 Nonrheumatic aortic valve disorder, unspecified: Secondary | ICD-10-CM

## 2014-01-05 LAB — COMPREHENSIVE METABOLIC PANEL
AST: 28 U/L (ref 15–37)
Albumin: 3.7 g/dL (ref 3.4–5.0)
Alkaline Phosphatase: 73 U/L
Anion Gap: 9 (ref 7–16)
BUN: 13 mg/dL (ref 7–18)
Bilirubin,Total: 0.2 mg/dL (ref 0.2–1.0)
CHLORIDE: 108 mmol/L — AB (ref 98–107)
CO2: 23 mmol/L (ref 21–32)
Calcium, Total: 8.6 mg/dL (ref 8.5–10.1)
Creatinine: 0.95 mg/dL (ref 0.60–1.30)
EGFR (African American): >60
EGFR (Non-African Amer.): >60
Glucose: 209 mg/dL — ABNORMAL HIGH (ref 65–99)
Osmolality: 286 (ref 275–301)
Potassium: 4.3 mmol/L (ref 3.5–5.1)
SGPT (ALT): 28 U/L (ref 12–78)
Sodium: 140 mmol/L (ref 136–145)
TOTAL PROTEIN: 6.9 g/dL (ref 6.4–8.2)

## 2014-01-05 LAB — CBC WITH DIFFERENTIAL/PLATELET
BASOS ABS: 0 10*3/uL (ref 0.0–0.1)
Basophil %: 0.7 %
Eosinophil #: 0 10*3/uL (ref 0.0–0.7)
Eosinophil %: 0.2 %
HCT: 26.3 % — ABNORMAL LOW (ref 35.0–47.0)
HGB: 8.5 g/dL — AB (ref 12.0–16.0)
Lymphocyte #: 0.9 10*3/uL — ABNORMAL LOW (ref 1.0–3.6)
Lymphocyte %: 16.8 %
MCH: 28.1 pg (ref 26.0–34.0)
MCHC: 32.1 g/dL (ref 32.0–36.0)
MCV: 87 fL (ref 80–100)
MONOS PCT: 7.7 %
Monocyte #: 0.4 x10 3/mm (ref 0.2–0.9)
NEUTROS PCT: 74.6 %
Neutrophil #: 4.2 10*3/uL (ref 1.4–6.5)
Platelet: 263 10*3/uL (ref 150–440)
RBC: 3.01 10*6/uL — ABNORMAL LOW (ref 3.80–5.20)
RDW: 15.6 % — ABNORMAL HIGH (ref 11.5–14.5)
WBC: 5.6 10*3/uL (ref 3.6–11.0)

## 2014-01-05 LAB — TROPONIN I
TROPONIN-I: 0.03 ng/mL
TROPONIN-I: 0.03 ng/mL
Troponin-I: 0.04 ng/mL

## 2014-01-05 LAB — FERRITIN: Ferritin (ARMC): 8 ng/mL (ref 8–388)

## 2014-01-05 LAB — CK TOTAL AND CKMB (NOT AT ARMC)
CK, TOTAL: 143 U/L
CK, Total: 142 U/L
CK-MB: 1.4 ng/mL (ref 0.5–3.6)
CK-MB: 1.6 ng/mL (ref 0.5–3.6)

## 2014-01-06 DIAGNOSIS — R079 Chest pain, unspecified: Secondary | ICD-10-CM

## 2014-01-06 LAB — BASIC METABOLIC PANEL
ANION GAP: 5 — AB (ref 7–16)
BUN: 14 mg/dL (ref 7–18)
CALCIUM: 8.6 mg/dL (ref 8.5–10.1)
CHLORIDE: 109 mmol/L — AB (ref 98–107)
Co2: 27 mmol/L (ref 21–32)
Creatinine: 1 mg/dL (ref 0.60–1.30)
EGFR (African American): >60
GFR CALC NON AF AMER: 57 — AB
GLUCOSE: 136 mg/dL — AB (ref 65–99)
OSMOLALITY: 284 (ref 275–301)
POTASSIUM: 3.8 mmol/L (ref 3.5–5.1)
Sodium: 141 mmol/L (ref 136–145)

## 2014-01-06 LAB — LIPID PANEL
Cholesterol: 126 mg/dL (ref 0–200)
HDL: 43 mg/dL (ref 40–60)
Ldl Cholesterol, Calc: 64 mg/dL (ref 0–100)
Triglycerides: 94 mg/dL (ref 0–200)
VLDL Cholesterol, Calc: 19 mg/dL (ref 5–40)

## 2014-01-06 LAB — CBC WITH DIFFERENTIAL/PLATELET
BASOS ABS: 0 10*3/uL (ref 0.0–0.1)
Basophil %: 0.3 %
EOS ABS: 0 10*3/uL (ref 0.0–0.7)
Eosinophil %: 0.1 %
HCT: 24.3 % — ABNORMAL LOW (ref 35.0–47.0)
HGB: 7.7 g/dL — AB (ref 12.0–16.0)
Lymphocyte #: 1.2 10*3/uL (ref 1.0–3.6)
Lymphocyte %: 18.5 %
MCH: 27.6 pg (ref 26.0–34.0)
MCHC: 31.8 g/dL — AB (ref 32.0–36.0)
MCV: 87 fL (ref 80–100)
MONOS PCT: 9 %
Monocyte #: 0.6 x10 3/mm (ref 0.2–0.9)
NEUTROS PCT: 72.1 %
Neutrophil #: 4.8 10*3/uL (ref 1.4–6.5)
Platelet: 231 10*3/uL (ref 150–440)
RBC: 2.81 10*6/uL — ABNORMAL LOW (ref 3.80–5.20)
RDW: 15.4 % — ABNORMAL HIGH (ref 11.5–14.5)
WBC: 6.7 10*3/uL (ref 3.6–11.0)

## 2014-01-06 LAB — CK TOTAL AND CKMB (NOT AT ARMC)
CK, Total: 154 U/L
CK-MB: 1.7 ng/mL (ref 0.5–3.6)

## 2014-01-06 LAB — TSH: Thyroid Stimulating Horm: 0.918 u[IU]/mL

## 2014-01-06 LAB — HEMOGLOBIN A1C: Hemoglobin A1C: 5.8 % (ref 4.2–6.3)

## 2014-01-06 LAB — MAGNESIUM: Magnesium: 1.6 mg/dL — ABNORMAL LOW

## 2014-01-09 ENCOUNTER — Encounter: Payer: Self-pay | Admitting: Internal Medicine

## 2014-01-14 ENCOUNTER — Ambulatory Visit (INDEPENDENT_AMBULATORY_CARE_PROVIDER_SITE_OTHER): Payer: Medicare Other | Admitting: Internal Medicine

## 2014-01-14 ENCOUNTER — Encounter: Payer: Self-pay | Admitting: Internal Medicine

## 2014-01-14 VITALS — BP 142/78 | HR 76 | Temp 98.1°F | Wt 128.4 lb

## 2014-01-14 DIAGNOSIS — R0789 Other chest pain: Secondary | ICD-10-CM

## 2014-01-14 NOTE — Assessment & Plan Note (Signed)
Ad[facility] Myocardial scan low risk and echo fairly normal Doesn't sound GI either---no clear etiology Reassured that it doesn't sound like anything serious (related to reaching, etc at work??) No further action or testing

## 2014-01-14 NOTE — Progress Notes (Signed)
Subjective:    Patient ID: Kathryn Ware, female    DOB: [birth year], 73 y.o.   MRN: [id number]  HPI Had episode of recurrent chest pain--along left sternal border Dull pain, then sharp 3 different times---went to urgent care. They sent her home Then called rescue when it recurred Reviewed [facility] records-- echo benign and low risk myocardial scan  May have been constipated The pain was not related to eating  ?one little dull ache in that spot since discharge No palpitations No SOB throughout this episode  Does do a lot of reaching at work--hanging things up (7-8#) Went back to work in November No apparent injury though Does feel it in her shoulders  Current Outpatient Prescriptions on File Prior to Visit  Medication Sig Dispense Refill  . clonazePAM (KLONOPIN) 0.5 MG tablet Take 1 tablet (0.5 mg total) by mouth at bedtime as needed.  30 tablet  0  . ferrous sulfate 325 (65 FE) MG tablet Take 325 mg by mouth daily with breakfast.        . glipiZIDE (GLUCOTROL XL) 10 MG 24 hr tablet TAKE 1 TABLET BY MOUTH TWICE DAILY  60 tablet  11  . glucose blood (ONE TOUCH ULTRA TEST) test strip Use as directed to test blood sugar once daily dx: 250.00  100 each  3  . lisinopril (PRINIVIL,ZESTRIL) 40 MG tablet TAKE ONE TABLET BY MOUTH EVERY DAY  30 tablet  11  . metFORMIN (GLUCOPHAGE-XR) 500 MG 24 hr tablet TAKE ONE TABLET BY MOUTH TWICE DAILY  60 tablet  11  . omeprazole (PRILOSEC) 20 MG capsule Take 1 capsule (20 mg total) by mouth daily.  90 capsule  3  . simvastatin (ZOCOR) 80 MG tablet TAKE 1 TABLET BY MOUTH EVERY DAY  90 tablet  3  . sucralfate (CARAFATE) 1 G tablet TAKE 1 TABLET BY MOUTH BEFORE MEALS AND EVERY NIGHT AT BEDTIME  120 tablet  0   No current facility-administered medications on file prior to visit.    Allergies  Allergen Reactions  . Metformin     REACTION: diarrhea  . Nifedipine     REACTION: cough    Past Medical History  Diagnosis Date  . Diabetes mellitus type  II   . HTN (hypertension)   . HLD (hyperlipidemia)   . RLS (restless legs syndrome)   . GI AVM (gastrointestinal arteriovenous vascular malformation)     stomach  . Chest pain 3/15    Echo normal except mild diastolic dysfunction. Low risk myocardial scan    Past Surgical History  Procedure Laterality Date  . Abdominal hysterectomy  1976    RSO (fibroid)  . Top    . Abdominal adhesion surgery  11/2001    Dr. Tamala Julian  . Esophagogastroduodenoscopy  07/2006    multiple angioectasias  . Doppler echocardiography      EF 55%, mild MR, TR  . Dobutamine stress echo  11/10    normal  . Laryngeal polyp  10/2008    Dr. Tami Ribas    Family History  Problem Relation Age of Onset  . Stroke Father   . Hypertension Mother   . Hypertension      sibling  . Diabetes      grandmother    History   Social History  . Marital Status: Married    Spouse Name: N/A    Number of Children: 1  . Years of Education: N/A   Occupational History  . 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 Smoking status: Current Every Day Smoker  . Smokeless tobacco: Never Used  . Alcohol Use: No  . Drug Use: Not on file  . Sexual Activity: Not on file   Other Topics Concern  . Not on file   Social History Narrative   No living will   Requests son, Orson Gear, or sister, Gretta Cool as health care POAs.   Would accept resuscitation    Not sure about feeding tubes   Review of Systems Sugars okay Sleep is still not great but no change Appetite is fine     Objective:   Physical Exam  Constitutional: She appears well-developed and well-nourished. No distress.  Neck: Normal range of motion. Neck supple. No thyromegaly present.  Cardiovascular: Normal rate and regular rhythm.  Exam reveals no gallop.   Soft systolic murmur along lower left sternal border  Pulmonary/Chest: Effort normal and breath sounds normal. No respiratory distress. She has no wheezes. She  has no rales. She exhibits no tenderness.  No sternal tenderness  Musculoskeletal: She exhibits no edema and no tenderness.  Lymphadenopathy:    She has no cervical adenopathy.          Assessment & Plan:

## 2014-01-14 NOTE — Progress Notes (Signed)
Pre visit review using our clinic review tool, if applicable. No additional management support is needed unless otherwise documented below in the visit note. 

## 2014-01-15 ENCOUNTER — Telehealth: Payer: Self-pay | Admitting: Internal Medicine

## 2014-01-15 NOTE — Telephone Encounter (Signed)
Relevant patient education mailed to patient.  

## 2014-02-06 ENCOUNTER — Other Ambulatory Visit: Payer: Self-pay | Admitting: Internal Medicine

## 2014-02-09 ENCOUNTER — Other Ambulatory Visit: Payer: Self-pay | Admitting: Internal Medicine

## 2014-02-11 LAB — HM MAMMOGRAPHY: HM MAMMO: NEGATIVE

## 2014-02-12 ENCOUNTER — Encounter: Payer: Self-pay | Admitting: Internal Medicine

## 2014-02-27 ENCOUNTER — Encounter: Payer: Self-pay | Admitting: Internal Medicine

## 2014-03-09 ENCOUNTER — Other Ambulatory Visit: Payer: Self-pay | Admitting: Internal Medicine

## 2014-04-03 ENCOUNTER — Other Ambulatory Visit: Payer: Self-pay | Admitting: Internal Medicine

## 2014-04-21 ENCOUNTER — Other Ambulatory Visit: Payer: Self-pay | Admitting: Internal Medicine

## 2014-05-01 ENCOUNTER — Other Ambulatory Visit: Payer: Self-pay | Admitting: Internal Medicine

## 2014-07-23 LAB — HM DIABETES EYE EXAM

## 2014-07-29 ENCOUNTER — Encounter: Payer: Self-pay | Admitting: Internal Medicine

## 2014-07-29 ENCOUNTER — Ambulatory Visit (INDEPENDENT_AMBULATORY_CARE_PROVIDER_SITE_OTHER): Payer: Medicare Other | Admitting: Internal Medicine

## 2014-07-29 VITALS — BP 142/76 | HR 86 | Temp 97.8°F | Resp 14 | Wt 126.5 lb

## 2014-07-29 DIAGNOSIS — E1140 Type 2 diabetes mellitus with diabetic neuropathy, unspecified: Secondary | ICD-10-CM

## 2014-07-29 DIAGNOSIS — E7800 Pure hypercholesterolemia, unspecified: Secondary | ICD-10-CM

## 2014-07-29 DIAGNOSIS — K5520 Angiodysplasia of colon without hemorrhage: Secondary | ICD-10-CM

## 2014-07-29 DIAGNOSIS — G2581 Restless legs syndrome: Secondary | ICD-10-CM

## 2014-07-29 DIAGNOSIS — I10 Essential (primary) hypertension: Secondary | ICD-10-CM

## 2014-07-29 DIAGNOSIS — E1142 Type 2 diabetes mellitus with diabetic polyneuropathy: Secondary | ICD-10-CM

## 2014-07-29 LAB — HM DIABETES FOOT EXAM

## 2014-07-29 LAB — CBC WITH DIFFERENTIAL/PLATELET
BASOS PCT: 0.4 % (ref 0.0–3.0)
Basophils Absolute: 0 10*3/uL (ref 0.0–0.1)
Eosinophils Absolute: 0 10*3/uL (ref 0.0–0.7)
Eosinophils Relative: 0.7 % (ref 0.0–5.0)
HCT: 34.9 % — ABNORMAL LOW (ref 36.0–46.0)
HEMOGLOBIN: 11.2 g/dL — AB (ref 12.0–15.0)
LYMPHS PCT: 20.9 % (ref 12.0–46.0)
Lymphs Abs: 1.3 10*3/uL (ref 0.7–4.0)
MCHC: 32.2 g/dL (ref 30.0–36.0)
MCV: 84.8 fl (ref 78.0–100.0)
MONO ABS: 0.5 10*3/uL (ref 0.1–1.0)
Monocytes Relative: 8.4 % (ref 3.0–12.0)
NEUTROS ABS: 4.5 10*3/uL (ref 1.4–7.7)
Neutrophils Relative %: 69.6 % (ref 43.0–77.0)
Platelets: 276 10*3/uL (ref 150.0–400.0)
RBC: 4.11 Mil/uL (ref 3.87–5.11)
RDW: 14.6 % (ref 11.5–15.5)
WBC: 6.4 10*3/uL (ref 4.0–10.5)

## 2014-07-29 LAB — HEMOGLOBIN A1C: Hgb A1c MFr Bld: 6.3 % (ref 4.6–6.5)

## 2014-07-29 NOTE — Assessment & Plan Note (Signed)
Better Hasn't needed the clonazepam

## 2014-07-29 NOTE — Assessment & Plan Note (Signed)
Hopefully still good control

## 2014-07-29 NOTE — Assessment & Plan Note (Signed)
On maximal simvastatin Could consider change to atorvastatin if LDL >100---but not compelling

## 2014-07-29 NOTE — Assessment & Plan Note (Signed)
BP Readings from Last 3 Encounters:  07/29/14 142/76  01/14/14 142/78  10/29/13 140/70   Fair control On ACEI

## 2014-07-29 NOTE — Assessment & Plan Note (Signed)
Continue PPI and carafate Will check CBC

## 2014-07-29 NOTE — Progress Notes (Signed)
Pre visit review using our clinic review tool, if applicable. No additional management support is needed unless otherwise documented below in the visit note. 

## 2014-07-29 NOTE — Assessment & Plan Note (Signed)
Sensation seems better now No pain

## 2014-07-29 NOTE — Progress Notes (Signed)
Subjective:    Patient ID: Kathryn Ware, female    DOB: 03/19/1941, 73 y.o.   MRN: [id number]  HPI Here for follow up Doing okay  Still smoking Just hasn't been able to quit Didn't call the quit line  Checks sugars 3 times a week Usually 110-150 No hypoglycemic reactions No pain or sores in feet Eyes checked last week---glaucoma but no retinopathy  No chest pain No SOB No dizziness or syncope No edema Occasional headaches  No recent RLS symptoms Hasn't needed the clonazepam  Still on statin No myalgias No GI problems Still on sucralfate--no clear cut bleeding (but on iron)  Current Outpatient Prescriptions on File Prior to Visit  Medication Sig Dispense Refill  . clonazePAM (KLONOPIN) 0.5 MG tablet Take 1 tablet (0.5 mg total) by mouth at bedtime as needed.  30 tablet  0  . ferrous sulfate 325 (65 FE) MG tablet Take 325 mg by mouth daily with breakfast.        . glipiZIDE (GLUCOTROL XL) 10 MG 24 hr tablet TAKE 1 TABLET BY MOUTH TWICE DAILY  60 tablet  11  . lisinopril (PRINIVIL,ZESTRIL) 40 MG tablet TAKE 1 TABLET BY MOUTH EVERY DAY  30 tablet  11  . metFORMIN (GLUCOPHAGE-XR) 500 MG 24 hr tablet TAKE 1 TABLET BY MOUTH TWICE DAILY  60 tablet  11  . omeprazole (PRILOSEC) 20 MG capsule Take 1 capsule (20 mg total) by mouth daily.  90 capsule  3  . ONE TOUCH ULTRA TEST test strip USE AS DIRECTED TO TEST BLOOD SUGAR EVERY DAY  100 each  0  . simvastatin (ZOCOR) 80 MG tablet TAKE 1 TABLET BY MOUTH EVERY DAY  90 tablet  3  . sucralfate (CARAFATE) 1 G tablet TAKE 1 TABLET BY MOUTH BEFORE MEALS AND EVERY NIGHT AT BEDTIME  120 tablet  0   No current facility-administered medications on file prior to visit.    Allergies  Allergen Reactions  . Metformin     REACTION: diarrhea  . Nifedipine     REACTION: cough    Past Medical History  Diagnosis Date  . Diabetes mellitus type II   . HTN (hypertension)   . HLD (hyperlipidemia)   . RLS (restless legs syndrome)   . GI  AVM (gastrointestinal arteriovenous vascular malformation)     stomach  . Chest pain 3/15    Echo normal except mild diastolic dysfunction. Low risk myocardial scan    Past Surgical History  Procedure Laterality Date  . Abdominal hysterectomy  1976    RSO (fibroid)  . Top    . Abdominal adhesion surgery  11/2001    Dr. Tamala Julian  . Esophagogastroduodenoscopy  07/2006    multiple angioectasias  . Doppler echocardiography      EF 55%, mild MR, TR  . Dobutamine stress echo  11/10    normal  . Laryngeal polyp  10/2008    Dr. Tami Ribas    Family History  Problem Relation Age of Onset  . Stroke Father   . Hypertension Mother   . Hypertension      sibling  . Diabetes      grandmother    History   Social History  . Marital Status: Married    Spouse Name: N/A    Number of Children: 1  . Years of Education: N/A   Occupational History  . 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 Smoking  status: Current Every Day Smoker  . Smokeless tobacco: Never Used  . Alcohol Use: No  . Drug Use: Not on file  . Sexual Activity: Not on file   Other Topics Concern  . Not on file   Social History Narrative   No living will   Requests son, Orson Gear, or sister, Gretta Cool as health care POAs.   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 Deciding about whether to look for something else    Objective:   Physical Exam  Constitutional: She appears well-developed and well-nourished. No distress.  Neck: Normal range of motion. Neck supple. No thyromegaly present.  Cardiovascular: Normal rate, regular rhythm and intact distal pulses.  Exam reveals no gallop.   Faint pulses in feet Soft aortic systolic murmur    Pulmonary/Chest: Effort normal and breath sounds normal. No respiratory distress. She has no wheezes. She has no rales.  Abdominal: Soft. There is no tenderness.  Musculoskeletal: She exhibits no edema and no tenderness.  Lymphadenopathy:    She has no cervical adenopathy.  Neurological:  Normal sensation in plantar feet today  Skin: No rash noted. No erythema.  No foot lesions  Psychiatric: She has a normal mood and affect. Her behavior is normal.          Assessment & Plan:

## 2014-07-30 LAB — COMPREHENSIVE METABOLIC PANEL
ALK PHOS: 45 U/L (ref 39–117)
ALT: 11 U/L (ref 0–35)
AST: 17 U/L (ref 0–37)
Albumin: 4.2 g/dL (ref 3.5–5.2)
BUN: 17 mg/dL (ref 6–23)
CALCIUM: 10 mg/dL (ref 8.4–10.5)
CHLORIDE: 107 meq/L (ref 96–112)
CO2: 25 mEq/L (ref 19–32)
CREATININE: 0.9 mg/dL (ref 0.4–1.2)
GFR: 80.08 mL/min (ref >60.00)
Glucose, Bld: 83 mg/dL (ref 70–99)
Potassium: 4.3 mEq/L (ref 3.5–5.1)
Sodium: 140 mEq/L (ref 135–145)
Total Bilirubin: 0.3 mg/dL (ref 0.2–1.2)
Total Protein: 7.8 g/dL (ref 6.0–8.3)

## 2014-07-30 LAB — LIPID PANEL
CHOL/HDL RATIO: 4
Cholesterol: 167 mg/dL (ref 0–200)
HDL: 37.8 mg/dL — ABNORMAL LOW (ref >39.00)
LDL CALC: 112 mg/dL — AB (ref 0–99)
NonHDL: 129.2
TRIGLYCERIDES: 84 mg/dL (ref 0.0–149.0)
VLDL: 16.8 mg/dL (ref 0.0–40.0)

## 2014-07-30 LAB — T4, FREE: Free T4: 0.95 ng/dL (ref 0.60–1.60)

## 2014-07-30 LAB — HM DIABETES EYE EXAM

## 2014-09-16 ENCOUNTER — Other Ambulatory Visit: Payer: Self-pay | Admitting: Internal Medicine

## 2014-12-05 ENCOUNTER — Other Ambulatory Visit: Payer: Self-pay | Admitting: Internal Medicine

## 2014-12-15 ENCOUNTER — Other Ambulatory Visit: Payer: Self-pay | Admitting: Internal Medicine

## 2015-02-14 NOTE — H&P (Signed)
PATIENT NAME:  DAVITA, SUBLETT MR#:  [id number] DATE OF BIRTH:  Dec 31, 1940  DATE OF ADMISSION:  01/05/2014  PRIMARY CARE PHYSICIAN: Dr. Silvio Pate   REFERRING PHYSICIAN:  Dr. Benjaman Lobe  CHIEF COMPLAINT: Chest pain.  HISTORY OF PRESENT ILLNESS: The patient is a pleasant, 74 year old female with history of diabetes, hypertension, hyperlipidemia, and anemia, who is here for chest pain. The pain started 2 weeks ago, off and on, lasts seconds, without radiation, substernal. There are no associated symptoms otherwise. However, the pain is becoming more frequent. She actually went to a walk-in yesterday, where EKG was done, per patient, and she was told if this is recurrent to come back. Yesterday she had 2 episodes, again lasted shortly, and today had another episode and came into the hospital. She has a negative troponin, but the blood pressures have been elevated here. She has anemia, with hemoglobin of 8.5, with EKG changes of T-wave inversions in V5, V6. Therefore, hospitalist services were contacted for further evaluation and management. The patient is chest pain free now.   PAST MEDICAL HISTORY: Hypertension, hyperlipidemia, anemia, diabetes.   SURGICAL HISTORY: Hysterectomy, vocal cord polypectomy, intestinal surgery.   ALLERGIES: Denies.   FAMILY HISTORY: Sister with hypertension. No history of CHF or MI, per patient.   SOCIAL HISTORY: Still smokes a pack a day. Smoked for close to 60 years. No alcohol or drug use.   OUTPATIENT MEDICATIONS: Lisinopril 40 mg daily, glipizide 10 mg extended release 2 times a day, ferrous sulfate 325 mg daily, metformin 500 mg daily, omeprazole 20 mg daily, simvastatin 80 mg daily, sucralfate 1 gram 4 times a day, vitamin B12, 1000 mcg daily.   REVIEW OF SYSTEMS:   CONSTITUTIONAL: No fever or weight changes.  EYES: No blurry vision or double vision.  ENT: No tinnitus or hearing loss.  RESPIRATORY: No cough, wheezing, shortness of breath, or dyspnea on exertion.   CARDIOVASCULAR: Chest pain as above. No orthopnea or swelling in the legs. Occasional palpitations.  GASTROINTESTINAL: No nausea, vomiting, diarrhea, bloody stools or dark stools. No melena.  GENITOURINARY: Denies dysuria, hematuria. HEMATOLOGIC/LYMPHATIC:  No easy bruising, but does have anemia.  ENDOCRINE: No polyuria or nocturia.  SKIN: No rashes.  MUSCULOSKELETAL: Denies arthritis or gout.  NEUROLOGIC:  No focal weakness or numbness.  PSYCHIATRIC: Denies anxiety or insomnia.   PHYSICAL EXAMINATION: VITAL SIGNS: Temperature on arrival 98, pulse rate 90, respiratory rate 18, blood pressure 182/94, O2 sat 99% on room air. Last blood pressure 173/68. GENERAL: The patient is a well-developed female sitting in bed eating, no obvious distress.  HEENT: Normocephalic, atraumatic. Pupils are equal and reactive. Anicteric sclerae. Moist mucous membranes. NECK: Supple. No thyroid tenderness. No cervical lymphadenopathy.  CARDIOVASCULAR: S1, S2 regular. Positive for murmur at aortic, grade 4/6, with radiation to the neck.  LUNGS: Clear to auscultation without wheezing, rhonchi, or rales.  ABDOMEN: Soft, nontender, nondistended. Positive bowel sounds in all quadrants.  EXTREMITIES: No pitting edema.  NEUROLOGIC: Cranial nerves II through XII grossly intact. Strength is 5/5 all extremities. Sensation is intact to light touch.  PSYCHIATRIC: Awake, alert, oriented x 3.  SKIN: No obvious rashes or lesions.   LABS AND IMAGING: White count of 5.6, hemoglobin 8.5, platelets are 363. LFTs within normal limits. Troponin 0.03. Glucose 219, BUN 13, creatinine 0.95, sodium 140, potassium 4.3.  EKG: Rate of 84, sinus, with T-wave inversions in V5, V6. No acute ST elevations or depressions. Chest x-ray, one view:  No active disease.   ASSESSMENT AND PLAN:  A 74 year old female with diabetes, hypertension, hyperlipidemia, active smoker, who comes in for progressive chest pain, with negative troponin but some  T-wave inversions. At this point, the patient has multiple risk factors for underlying coronary artery disease, as dictated above, and we would bring her in for observation and ruling out acute coronary syndrome. Would order cyclic troponins, CK-MB. Admit her to telemetry and monitor her. Order an echocardiogram as well as a stress test for tomorrow. If patient is ruled out for acute coronary syndrome, I would start her on a beta blocker in addition to continuation of her aspirin, statin and ACE inhibitor. Her blood pressure is a little up, and we have started on metoprolol here. Perhaps, if the blood pressure is up, we could bump up the metoprolol or start another medicine, but at this point she is chest pain free. She does have a murmur, and we would further evaluate that with echocardiogram.   In regards to her diabetes, I will start sliding scale insulin, check a hemoglobin A1c. She does have anemia, and I will check iron studies. She states she has had a colonoscopy, but does not know when. That is recommended if she has not had it in the last 10 years. She should follow with her PCP about her ongoing anemia. I would continue the iron and start her on deep vein prophylaxis with heparin.   Total time spent is 45 minutes.   The patient is FULL CODE.    ____________________________ Vivien Presto, MD sa:mr D: 01/05/2014 16:21:51 ET T: 01/05/2014 18:54:53 ET JOB#: [id number]  cc: Vivien Presto, MD, <Dictator> Venia Carbon, MD  Vivien Presto MD ELECTRONICALLY SIGNED 01/23/2014 15:46

## 2015-02-14 NOTE — Discharge Summary (Signed)
PATIENT NAME:  Kathryn Ware, Kathryn Ware MR#:  [id number] DATE OF BIRTH:  1941-04-25  DATE OF ADMISSION:  01/05/2014 DATE OF DISCHARGE:  01/06/2014  DISCHARGE DIAGNOSES: 1.  Gastroesophageal reflux disease causing chest pain.  2.  Chronic anemia.  3.  Hypertension.  4.  Tobacco abuse.   ADMITTING HISTORY AND PHYSICAL AND HOSPITAL COURSE: Please see detailed H and P dictated by Dr. Marti Sleigh. In brief, a 74 year old African American female patient with history of chronic iron deficiency anemia, hypertension, diabetes, presented to the hospital complaining of on-and-off chest pain for about 2 weeks which lasts about a minute. The patient mentions that the pain tends to happen after eating occasionally. She does have a history of GERD, for which she uses PPIs only occasionally, not regularly. The patient was admitted on tele floor to rule out acute coronary syndrome. No arrhythmias on tele, cardiac enzymes are normal. Had a stress test which showed no acute abnormalities. Echo was done which showed no wall motion abnormalities. The patient is being discharged back home after being started on PPI twice a day scheduled for her GERD. If the patient continues to have this pain in spite of treatment, she will need an endoscopy as outpatient. The patient does have iron deficiency anemia, for which she is on iron and is to follow up with her primary care physician.   Prior to discharge, the patient was examined showing alert and oriented x 3, ambulating well. No focal neurological deficits. No chest wall tenderness. Cardiac examination showed S1 and S2 without any murmurs.   DISCHARGE MEDICATIONS: 1.  Sucralfate 1 gram oral 4 times a day before meals and at bedtime.  2.  Metformin 500 mg oral once a day.  3.  Glipizide 10 mg oral 2 times a day.  4.  Simvastatin 80 mg once a day.  5.  Vitamin B12, 1000 mcg daily.  6.  Lisinopril 40 mg daily.  7.  Ferrous sulfate 325 mg daily.  8.  Omeprazole 20 mg oral 2 times a day.    DISCHARGE INSTRUCTIONS: Low-sodium, carbohydrate-controlled diet. Activity as tolerated. Follow up with primary care physician in 1 or 2 weeks. The patient has been counseled to quit smoking on the day of discharge.   TIME SPENT ON DAY OF DISCHARGE IN DISCHARGE ACTIVITY: Was 40 minutes.   ____________________________ Leia Alf Caralynn Gelber, MD srs:cs D: 01/07/2014 14:52:00 ET T: 01/07/2014 19:35:40 ET JOB#: [id number]  cc: Venia Carbon, MD Shaneta Cervenka R. Demetries Coia, MD, <Dictator>   Neita Carp MD ELECTRONICALLY SIGNED 01/18/2014 10:24

## 2015-02-16 ENCOUNTER — Other Ambulatory Visit: Payer: Self-pay | Admitting: Internal Medicine

## 2015-02-18 ENCOUNTER — Ambulatory Visit (INDEPENDENT_AMBULATORY_CARE_PROVIDER_SITE_OTHER): Payer: Medicare Other | Admitting: Internal Medicine

## 2015-02-18 ENCOUNTER — Encounter: Payer: Self-pay | Admitting: Internal Medicine

## 2015-02-18 VITALS — BP 152/74 | HR 102 | Temp 98.1°F | Ht 63.0 in | Wt 129.0 lb

## 2015-02-18 DIAGNOSIS — D62 Acute posthemorrhagic anemia: Secondary | ICD-10-CM | POA: Insufficient documentation

## 2015-02-18 DIAGNOSIS — I10 Essential (primary) hypertension: Secondary | ICD-10-CM | POA: Diagnosis not present

## 2015-02-18 DIAGNOSIS — Z7189 Other specified counseling: Secondary | ICD-10-CM

## 2015-02-18 DIAGNOSIS — Z23 Encounter for immunization: Secondary | ICD-10-CM

## 2015-02-18 DIAGNOSIS — E119 Type 2 diabetes mellitus without complications: Secondary | ICD-10-CM | POA: Diagnosis not present

## 2015-02-18 DIAGNOSIS — Z0000 Encounter for general adult medical examination without abnormal findings: Secondary | ICD-10-CM

## 2015-02-18 DIAGNOSIS — D500 Iron deficiency anemia secondary to blood loss (chronic): Secondary | ICD-10-CM | POA: Diagnosis not present

## 2015-02-18 LAB — HM DIABETES FOOT EXAM

## 2015-02-18 LAB — HEMOGLOBIN A1C: HEMOGLOBIN A1C: 7.4 % — AB (ref 4.6–6.5)

## 2015-02-18 MED ORDER — LISINOPRIL-HYDROCHLOROTHIAZIDE 20-12.5 MG PO TABS: 2.0000 | ORAL_TABLET | Freq: Every day | ORAL | Status: DC

## 2015-02-18 NOTE — Assessment & Plan Note (Signed)
Still seems to have good control Will recheck A1c 

## 2015-02-18 NOTE — Assessment & Plan Note (Signed)
Angiodysplasias On iron

## 2015-02-18 NOTE — Assessment & Plan Note (Addendum)
I have personally reviewed the Medicare Annual Wellness questionnaire and have noted 1. The patient's medical and social history 2. Their use of alcohol, tobacco or illicit drugs 3. Their current medications and supplements 4. The patient's functional ability including ADL's, fall risks, home safety risks and hearing or visual             impairment. 5. Diet and physical activities 6. Evidence for depression or mood disorders  The patients weight, height, BMI and visual acuity have been recorded in the chart I have made referrals, counseling and provided education to the patient based review of the above and I have provided the pt with a written personalized care plan for preventive services.  I have provided you with a copy of your personalized plan for preventive services. Please take the time to review along with your updated medication list.  Mammogram due next April Colonoscopy due 2017 prevnar today Discussed cigarette cessation--not willing Does exercise a little Mild cognitive issues seem to be her baseline and no change over time

## 2015-02-18 NOTE — Progress Notes (Signed)
Pre visit review using our clinic review tool, if applicable. No additional management support is needed unless otherwise documented below in the visit note. 

## 2015-02-18 NOTE — Addendum Note (Signed)
Addended by: Despina Hidden on: 02/18/2015 03:44 PM   Modules accepted: Orders

## 2015-02-18 NOTE — Assessment & Plan Note (Signed)
See social history 

## 2015-02-18 NOTE — Progress Notes (Signed)
Subjective:    Patient ID: Kathryn Ware, female    DOB: June 10, 1941, 74 y.o.   MRN: [id number]  HPI Here for Medicare wellness and follow up of chronic medical conditions Reviewed form and advanced directives Reviewed other physicians No alcohol Still smokes about 1 PPD---- more lately since she stopped working. She is just not able to stop (doesn't really want to) No falls Some depressed mood---out of work. Not anhedonic She hasn't noticed a problem with memory Vision okay. Hearing is pretty good Independent with instrumental ADLs  May have slight cold Has been out planting tomatoes--so could be pollen related No SOB No regular cough---just slight symptoms for the past day  Checks sugars 3 times a week or so Usually 100-120 Slight low sugar reactions in past few weeks--no neuroglycopenia No sores or pain in feet---?mild sensory changes still. Careful with toenails  No signs of bleeding No black stools Does have some fatigue---maybe cause she is out of work She is looking for another job  No chest pain No palpitations No dizziness or sycnope No edema No headache  No RLS in some time Hasn't needed the clonazepam  Current Outpatient Prescriptions on File Prior to Visit  Medication Sig Dispense Refill  . ferrous sulfate 325 (65 FE) MG tablet Take 325 mg by mouth daily with breakfast.      . glipiZIDE (GLUCOTROL XL) 10 MG 24 hr tablet TAKE 1 TABLET BY MOUTH TWICE DAILY 60 tablet 11  . lisinopril (PRINIVIL,ZESTRIL) 40 MG tablet TAKE 1 TABLET BY MOUTH EVERY DAY 30 tablet 11  . metFORMIN (GLUCOPHAGE-XR) 500 MG 24 hr tablet TAKE 1 TABLET BY MOUTH TWICE DAILY 60 tablet 11  . omeprazole (PRILOSEC) 20 MG capsule TAKE 1 CAPSULE BY MOUTH EVERY DAY 90 capsule 3  . ONE TOUCH ULTRA TEST test strip AS DIRECTED TO TEST BLOOD SUGAR EVERY DAY. 100 each 1  . simvastatin (ZOCOR) 80 MG tablet TAKE 1 TABLET BY MOUTH EVERY DAY. 90 tablet 1  . sucralfate (CARAFATE) 1 G tablet TAKE 1 TABLET  BY MOUTH BEFORE MEALS AND EVERY NIGHT AT BEDTIME 120 tablet 0   No current facility-administered medications on file prior to visit.    Allergies  Allergen Reactions  . Metformin     REACTION: diarrhea  . Nifedipine     REACTION: cough    Past Medical History  Diagnosis Date  . Diabetes mellitus type II   . HTN (hypertension)   . HLD (hyperlipidemia)   . RLS (restless legs syndrome)   . GI AVM (gastrointestinal arteriovenous vascular malformation)     stomach  . Chest pain 3/15    Echo normal except mild diastolic dysfunction. Low risk myocardial scan    Past Surgical History  Procedure Laterality Date  . Abdominal hysterectomy  1976    RSO (fibroid)  . Top    . Abdominal adhesion surgery  11/2001    Dr. Tamala Julian  . Esophagogastroduodenoscopy  07/2006    multiple angioectasias  . Doppler echocardiography      EF 55%, mild MR, TR  . Dobutamine stress echo  11/10    normal  . Laryngeal polyp  10/2008    Dr. Tami Ribas    Family History  Problem Relation Age of Onset  . Stroke Father   . Hypertension Mother   . Hypertension      sibling  . Diabetes      grandmother  . Cancer Sister     breast cancer  History   Social History  . Marital Status: Married    Spouse Name: N/A  . Number of Children: 1  . Years of Education: N/A   Occupational History  . 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 Smoking status: Current Every Day Smoker  . Smokeless tobacco: Never Used  . Alcohol Use: No  . Drug Use: Not on file  . Sexual Activity: Not on file   Other Topics Concern  . Not on file   Social History Narrative   No living will   Requests son Orson Gear as health care POA   Would accept resuscitation    Not sure about feeding tubes   Review of Systems Doesn't sleep well-- not a new thing. Some daytime fatigue Appetite is fine Weight up 2# Wears seat belt No skin problems--just dry No  problems with teeth---full dentures    Objective:   Physical Exam  Constitutional: She is oriented to person, place, and time. She appears well-developed and well-nourished. No distress.  HENT:  Mouth/Throat: Oropharynx is clear and moist. No oropharyngeal exudate.  Neck: Normal range of motion. Neck supple. No thyromegaly present.  Cardiovascular: Normal rate, regular rhythm, normal heart sounds and intact distal pulses.  Exam reveals no gallop.   No murmur heard. Pulmonary/Chest: Effort normal and breath sounds normal. No respiratory distress. She has no wheezes. She has no rales.  Abdominal: Soft. There is no tenderness.  Musculoskeletal: She exhibits no edema or tenderness.  Lymphadenopathy:    She has no cervical adenopathy.  Neurological: She is alert and oriented to person, place, and time.  President-- "Obama, Bush, Reagan" 100-93-  "I'm not good at math" D-l-r-o-w (3 tries) Recall 2/3  Feet sensation intact to monofilament  Skin: No rash noted. No erythema.  No foot lesions  Psychiatric: She has a normal mood and affect. Her behavior is normal.          Assessment & Plan:

## 2015-02-18 NOTE — Patient Instructions (Addendum)
You are due for a mammogram in April 2017. Please stop lisinopril and instead take 2 of the new medication--lisinopril/HCTZ Come back in about 1 month for a blood test

## 2015-02-18 NOTE — Assessment & Plan Note (Signed)
BP Readings from Last 3 Encounters:  02/18/15 152/74  07/29/14 142/76  01/14/14 142/78   Usually fairly good Will add HCTZ

## 2015-02-19 ENCOUNTER — Telehealth: Payer: Self-pay | Admitting: Internal Medicine

## 2015-02-19 ENCOUNTER — Encounter: Payer: Self-pay | Admitting: *Deleted

## 2015-03-10 ENCOUNTER — Emergency Department: Payer: Medicare Other

## 2015-03-10 ENCOUNTER — Inpatient Hospital Stay
Admission: EM | Admit: 2015-03-10 | Discharge: 2015-03-13 | DRG: 378 | Disposition: A | Discharge status: Home / Self Care | Payer: Medicare Other | Attending: Internal Medicine | Admitting: Internal Medicine

## 2015-03-10 DIAGNOSIS — E11649 Type 2 diabetes mellitus with hypoglycemia without coma: Secondary | ICD-10-CM | POA: Diagnosis not present

## 2015-03-10 DIAGNOSIS — D500 Iron deficiency anemia secondary to blood loss (chronic): Secondary | ICD-10-CM | POA: Diagnosis not present

## 2015-03-10 DIAGNOSIS — I255 Ischemic cardiomyopathy: Secondary | ICD-10-CM | POA: Diagnosis not present

## 2015-03-10 DIAGNOSIS — K219 Gastro-esophageal reflux disease without esophagitis: Secondary | ICD-10-CM | POA: Diagnosis present

## 2015-03-10 DIAGNOSIS — G2581 Restless legs syndrome: Secondary | ICD-10-CM | POA: Diagnosis present

## 2015-03-10 DIAGNOSIS — E860 Dehydration: Secondary | ICD-10-CM | POA: Diagnosis present

## 2015-03-10 DIAGNOSIS — I10 Essential (primary) hypertension: Secondary | ICD-10-CM | POA: Diagnosis present

## 2015-03-10 DIAGNOSIS — E785 Hyperlipidemia, unspecified: Secondary | ICD-10-CM | POA: Diagnosis present

## 2015-03-10 DIAGNOSIS — Z90710 Acquired absence of both cervix and uterus: Secondary | ICD-10-CM | POA: Diagnosis not present

## 2015-03-10 DIAGNOSIS — Z833 Family history of diabetes mellitus: Secondary | ICD-10-CM

## 2015-03-10 DIAGNOSIS — J449 Chronic obstructive pulmonary disease, unspecified: Secondary | ICD-10-CM | POA: Diagnosis present

## 2015-03-10 DIAGNOSIS — Z6826 Body mass index (BMI) 26.0-26.9, adult: Secondary | ICD-10-CM | POA: Diagnosis not present

## 2015-03-10 DIAGNOSIS — E780 Pure hypercholesterolemia: Secondary | ICD-10-CM | POA: Diagnosis present

## 2015-03-10 DIAGNOSIS — K922 Gastrointestinal hemorrhage, unspecified: Secondary | ICD-10-CM

## 2015-03-10 DIAGNOSIS — Z720 Tobacco use: Secondary | ICD-10-CM | POA: Diagnosis not present

## 2015-03-10 DIAGNOSIS — Q2733 Arteriovenous malformation of digestive system vessel: Secondary | ICD-10-CM | POA: Diagnosis not present

## 2015-03-10 DIAGNOSIS — Z888 Allergy status to other drugs, medicaments and biological substances status: Secondary | ICD-10-CM

## 2015-03-10 DIAGNOSIS — I248 Other forms of acute ischemic heart disease: Secondary | ICD-10-CM | POA: Diagnosis present

## 2015-03-10 DIAGNOSIS — Z823 Family history of stroke: Secondary | ICD-10-CM

## 2015-03-10 DIAGNOSIS — E119 Type 2 diabetes mellitus without complications: Secondary | ICD-10-CM | POA: Diagnosis not present

## 2015-03-10 DIAGNOSIS — K921 Melena: Secondary | ICD-10-CM

## 2015-03-10 DIAGNOSIS — Z716 Tobacco abuse counseling: Secondary | ICD-10-CM | POA: Diagnosis not present

## 2015-03-10 DIAGNOSIS — R7989 Other specified abnormal findings of blood chemistry: Secondary | ICD-10-CM | POA: Diagnosis not present

## 2015-03-10 DIAGNOSIS — N289 Disorder of kidney and ureter, unspecified: Secondary | ICD-10-CM | POA: Diagnosis present

## 2015-03-10 DIAGNOSIS — Z90721 Acquired absence of ovaries, unilateral: Secondary | ICD-10-CM | POA: Diagnosis not present

## 2015-03-10 DIAGNOSIS — D62 Acute posthemorrhagic anemia: Secondary | ICD-10-CM | POA: Diagnosis present

## 2015-03-10 DIAGNOSIS — R531 Weakness: Secondary | ICD-10-CM | POA: Diagnosis not present

## 2015-03-10 DIAGNOSIS — R634 Abnormal weight loss: Secondary | ICD-10-CM | POA: Diagnosis present

## 2015-03-10 DIAGNOSIS — K31811 Angiodysplasia of stomach and duodenum with bleeding: Secondary | ICD-10-CM | POA: Diagnosis not present

## 2015-03-10 DIAGNOSIS — K279 Peptic ulcer, site unspecified, unspecified as acute or chronic, without hemorrhage or perforation: Secondary | ICD-10-CM | POA: Diagnosis not present

## 2015-03-10 DIAGNOSIS — K31819 Angiodysplasia of stomach and duodenum without bleeding: Secondary | ICD-10-CM | POA: Diagnosis not present

## 2015-03-10 DIAGNOSIS — Z803 Family history of malignant neoplasm of breast: Secondary | ICD-10-CM | POA: Diagnosis not present

## 2015-03-10 DIAGNOSIS — F17200 Nicotine dependence, unspecified, uncomplicated: Secondary | ICD-10-CM | POA: Diagnosis present

## 2015-03-10 DIAGNOSIS — R0602 Shortness of breath: Secondary | ICD-10-CM | POA: Diagnosis not present

## 2015-03-10 DIAGNOSIS — Z8249 Family history of ischemic heart disease and other diseases of the circulatory system: Secondary | ICD-10-CM | POA: Diagnosis not present

## 2015-03-10 HISTORY — DX: Personal history of other medical treatment: Z92.89

## 2015-03-10 LAB — CBC
HCT: 20.4 % — ABNORMAL LOW (ref 35.0–47.0)
Hemoglobin: 6.5 g/dL — ABNORMAL LOW (ref 12.0–16.0)
MCH: 27.1 pg (ref 26.0–34.0)
MCHC: 31.7 g/dL — ABNORMAL LOW (ref 32.0–36.0)
MCV: 85.5 fL (ref 80.0–100.0)
PLATELETS: 221 10*3/uL (ref 150–440)
RBC: 2.38 MIL/uL — ABNORMAL LOW (ref 3.80–5.20)
RDW: 15 % — AB (ref 11.5–14.5)
WBC: 6.8 10*3/uL (ref 3.6–11.0)

## 2015-03-10 LAB — COMPREHENSIVE METABOLIC PANEL
ALT: 9 U/L — ABNORMAL LOW (ref 14–54)
ANION GAP: 9 (ref 5–15)
AST: 18 U/L (ref 15–41)
Albumin: 3.8 g/dL (ref 3.5–5.0)
Alkaline Phosphatase: 43 U/L (ref 38–126)
BUN: 19 mg/dL (ref 6–20)
CALCIUM: 9 mg/dL (ref 8.9–10.3)
CO2: 22 mmol/L (ref 22–32)
CREATININE: 1.18 mg/dL — AB (ref 0.44–1.00)
Chloride: 109 mmol/L (ref 101–111)
GFR calc Af Amer: 52 mL/min — ABNORMAL LOW (ref >60)
GFR calc non Af Amer: 45 mL/min — ABNORMAL LOW (ref >60)
Glucose, Bld: 197 mg/dL — ABNORMAL HIGH (ref 65–99)
Potassium: 4.2 mmol/L (ref 3.5–5.1)
Sodium: 140 mmol/L (ref 135–145)
Total Bilirubin: 0.4 mg/dL (ref 0.3–1.2)
Total Protein: 6.8 g/dL (ref 6.5–8.1)

## 2015-03-10 LAB — TROPONIN I
TROPONIN I: 0.07 ng/mL — AB (ref <0.031)
Troponin I: 0.07 ng/mL — ABNORMAL HIGH (ref <0.031)

## 2015-03-10 LAB — GLUCOSE, CAPILLARY: Glucose-Capillary: 90 mg/dL (ref 65–99)

## 2015-03-10 LAB — HEMOGLOBIN: Hemoglobin: 6.3 g/dL — ABNORMAL LOW (ref 12.0–16.0)

## 2015-03-10 MED ORDER — INSULIN ASPART 100 UNIT/ML ~~LOC~~ SOLN: 0.0000 [IU] | Freq: Every day | SUBCUTANEOUS | Status: DC

## 2015-03-10 MED ORDER — SODIUM CHLORIDE 0.9 % IV SOLN
INTRAVENOUS | Status: DC
  Administered 2015-03-10: 23:00:00 via INTRAVENOUS

## 2015-03-10 MED ORDER — METOPROLOL TARTRATE 1 MG/ML IV SOLN
2.5000 mg | INTRAVENOUS | Status: DC | PRN
  Filled 2015-03-10: qty 5

## 2015-03-10 MED ORDER — METOPROLOL TARTRATE 1 MG/ML IV SOLN
INTRAVENOUS | Status: AC
  Filled 2015-03-10: qty 5

## 2015-03-10 MED ORDER — SODIUM CHLORIDE 0.9 % IV SOLN
8.0000 mg/h | INTRAVENOUS | Status: DC
  Administered 2015-03-10 – 2015-03-13 (×5): 8 mg/h via INTRAVENOUS
  Filled 2015-03-10 (×5): qty 80

## 2015-03-10 MED ORDER — INSULIN ASPART 100 UNIT/ML ~~LOC~~ SOLN
0.0000 [IU] | Freq: Three times a day (TID) | SUBCUTANEOUS | Status: DC
  Administered 2015-03-11: 1 [IU] via SUBCUTANEOUS
  Administered 2015-03-12: 3 [IU] via SUBCUTANEOUS
  Filled 2015-03-10: qty 3
  Filled 2015-03-10: qty 1

## 2015-03-10 MED ORDER — SODIUM CHLORIDE 0.9 % IV SOLN
80.0000 mg | Freq: Once | INTRAVENOUS | Status: AC
  Administered 2015-03-10: 80 mg via INTRAVENOUS
  Filled 2015-03-10: qty 80

## 2015-03-10 MED ORDER — ONDANSETRON HCL 4 MG PO TABS: 4.0000 mg | ORAL_TABLET | Freq: Four times a day (QID) | ORAL | Status: DC | PRN

## 2015-03-10 MED ORDER — ATORVASTATIN CALCIUM 20 MG PO TABS
40.0000 mg | ORAL_TABLET | Freq: Every day | ORAL | Status: DC
  Administered 2015-03-11 – 2015-03-12 (×2): 40 mg via ORAL
  Filled 2015-03-10 (×2): qty 2

## 2015-03-10 MED ORDER — NICOTINE 10 MG IN INHA: 1.0000 | RESPIRATORY_TRACT | Status: DC | PRN

## 2015-03-10 MED ORDER — ONDANSETRON HCL 4 MG/2ML IJ SOLN: 4.0000 mg | Freq: Four times a day (QID) | INTRAMUSCULAR | Status: DC | PRN

## 2015-03-10 MED ORDER — METOPROLOL TARTRATE 1 MG/ML IV SOLN
2.5000 mg | Freq: Once | INTRAVENOUS | Status: AC
  Administered 2015-03-10: 2.5 mg via INTRAVENOUS

## 2015-03-10 MED ORDER — SODIUM CHLORIDE 0.9 % IV SOLN: 10.0000 mL/h | Freq: Once | INTRAVENOUS | Status: DC

## 2015-03-10 MED ORDER — NICOTINE 21 MG/24HR TD PT24
21.0000 mg | MEDICATED_PATCH | Freq: Every day | TRANSDERMAL | Status: DC
  Administered 2015-03-10 – 2015-03-12 (×3): 21 mg via TRANSDERMAL
  Filled 2015-03-10 (×3): qty 1

## 2015-03-10 MED ORDER — SODIUM CHLORIDE 0.9 % IV BOLUS (SEPSIS)
250.0000 mL | Freq: Once | INTRAVENOUS | Status: AC
  Administered 2015-03-10: 250 mL via INTRAVENOUS

## 2015-03-10 NOTE — ED Notes (Signed)
Critical lab value-- troponin .07.    Dr Ether Griffins was notified.   Awaiting orders.

## 2015-03-10 NOTE — ED Notes (Signed)
Written consent for blood placed on chart.  Blood transfusion ordered released.  Admitting physician at bedside, family at bedside.   Waiting on protonix from pharmacy  Pt reports that she continues to feel weak,  Denies pain and SOB at this time,.  Will monitor.

## 2015-03-10 NOTE — H&P (Signed)
[facility] [place] NAME: Kathryn Ware    MR#:  [id number]  DATE OF BIRTH:  Aug 16, 1941  DATE OF ADMISSION:  03/10/2015  PRIMARY CARE PHYSICIAN: Viviana Simpler, MD   REQUESTING/REFERRING PHYSICIAN:  Dr. Karma Greaser  CHIEF COMPLAINT:   Chief Complaint  Patient presents with  . Shortness of Breath   HISTORY OF PRESENT ILLNESS: Kathryn Ware  is a 74 y.o. female with a known history of  diabetes, hypertension, hyperlipidemia, gastroesophageal reflux disease who had EGD in 2007 by Dr. Gustavo Lah revealing multiple gastric as well as duodenal angiectasias into the hospital to emergency room was complains of feeling dizzy, lightheaded. The patient, she has been having dizziness and lightheadedness episodes for approximately 1 week now, feeling presyncopal, having darkness in front of her eyes intermittently, especially whenever she stands up. She feels tired and weak and also losing weight. She stated that she lost approximately 50 pounds over the past one year. Says that she has very poor oral intake. Admits also shortness of breath. In emergency room, patient was noted to be anemic with hemoglobin level 6.5, she was also noted to be tachycardic with heart rate ranging from 100s to 115th and tachypneic with respiration rate 24.  Blood pressure was found to be normal.  Hospitalist services were contacted for admission. She was initiated on Protonix IV drip. Transfusion of one unit of red blood cells is being prepared.    PAST MEDICAL HISTORY:   Past Medical History  Diagnosis Date  . Diabetes mellitus type II   . HTN (hypertension)   . HLD (hyperlipidemia)   . RLS (restless legs syndrome)   . GI AVM (gastrointestinal arteriovenous vascular malformation)     stomach  . Chest pain 3/15    Echo normal except mild diastolic dysfunction. Low risk myocardial scan    PAST SURGICAL HISTORY:  Past Surgical History  Procedure Laterality Date  . Abdominal  hysterectomy  1976    RSO (fibroid)  . Top    . Abdominal adhesion surgery  11/2001    Dr. Tamala Julian  . Esophagogastroduodenoscopy  07/2006    multiple angioectasias  . Doppler echocardiography      EF 55%, mild MR, TR  . Dobutamine stress echo  11/10    normal  . Laryngeal polyp  10/2008    Dr. Tami Ribas    SOCIAL HISTORY:  History  Substance Use Topics  . Smoking status: Current Every Day Smoker  . Smokeless tobacco: Never Used  . Alcohol Use: No    FAMILY HISTORY:  Family History  Problem Relation Age of Onset  . Stroke Father   . Hypertension Mother   . Hypertension      sibling  . Diabetes      grandmother  . Cancer Sister     breast cancer    DRUG ALLERGIES:  Allergies  Allergen Reactions  . Metformin     REACTION: diarrhea  . Nifedipine     REACTION: cough    Review of Systems  Constitutional: Positive for weight loss and malaise/fatigue. Negative for fever and chills.  HENT: Negative for congestion, ear discharge, hearing loss and sore throat.   Eyes: Positive for blurred vision. Negative for double vision, photophobia and pain.  Respiratory: Positive for shortness of breath. Negative for cough, hemoptysis, sputum production and wheezing.   Cardiovascular: Positive for palpitations. Negative for chest pain, orthopnea, claudication, leg swelling and PND.  Gastrointestinal: Positive for blood in stool  and melena. Negative for heartburn, nausea, vomiting, abdominal pain, diarrhea and constipation.  Genitourinary: Negative for dysuria, urgency, frequency and hematuria.  Musculoskeletal: Negative for myalgias and falls.  Skin: Negative for itching and rash.  Neurological: Positive for dizziness and weakness. Negative for tingling, tremors, sensory change, speech change, focal weakness and headaches.  Psychiatric/Behavioral: Negative for depression.    MEDICATIONS AT HOME:  Prior to Admission medications   Medication Sig Start Date End Date Taking? Authorizing  Provider  ferrous sulfate 325 (65 FE) MG tablet Take 325 mg by mouth daily with breakfast.     Yes Historical Provider, MD  glipiZIDE (GLUCOTROL XL) 10 MG 24 hr tablet TAKE 1 TABLET BY MOUTH TWICE DAILY 02/16/15  Yes Venia Carbon, MD  lisinopril (PRINIVIL,ZESTRIL) 40 MG tablet Take 40 mg by mouth daily.   Yes Historical Provider, MD  lisinopril-hydrochlorothiazide (PRINZIDE,ZESTORETIC) 20-12.5 MG per tablet Take 2 tablets by mouth daily. 02/18/15  Yes Venia Carbon, MD  metFORMIN (GLUCOPHAGE-XR) 500 MG 24 hr tablet TAKE 1 TABLET BY MOUTH TWICE DAILY 04/03/14  Yes Venia Carbon, MD  omeprazole (PRILOSEC) 20 MG capsule TAKE 1 CAPSULE BY MOUTH EVERY DAY 12/05/14  Yes Venia Carbon, MD  simvastatin (ZOCOR) 80 MG tablet TAKE 1 TABLET BY MOUTH EVERY DAY. 09/16/14  Yes Venia Carbon, MD  sucralfate (CARAFATE) 1 G tablet TAKE 1 TABLET BY MOUTH BEFORE MEALS AND EVERY NIGHT AT BEDTIME   Yes Venia Carbon, MD  vitamin B-12 (CYANOCOBALAMIN) 1000 MCG tablet Take 1,000 mcg by mouth daily.   Yes Historical Provider, MD  ONE TOUCH ULTRA TEST test strip AS DIRECTED TO TEST BLOOD SUGAR EVERY DAY. 12/15/14   Venia Carbon, MD      PHYSICAL EXAMINATION:   VITAL SIGNS: Blood pressure 138/63, pulse 102, temperature 98 F (36.7 C), temperature source Oral, resp. rate 24, height '5\' 5"'$  (1.651 m), weight 56.7 kg (125 lb), SpO2 100 %.  GENERAL:  74 y.o.-year-old patient lying in the bed with no acute distress.  EYES: Pupils equal, round, reactive to light and accommodation. No scleral icterus. Extraocular muscles intact. Pale.  HEENT: Head atraumatic, normocephalic. Oropharynx and nasopharynx clear.  NECK:  Supple, no jugular venous distention. No thyroid enlargement, no tenderness.  LUNGS: Normal breath sounds bilaterally, no wheezing, rales,rhonchi or crepitation. No use of accessory muscles of respiration.  CARDIOVASCULAR: S1, S2 normal. A shin has 4/6 systolic murmur leading to left axilla , no  rubs, or gallops. Tachycardic.  ABDOMEN: Soft, nontender, nondistended. Bowel sounds present. No organomegaly or mass.  EXTREMITIES: No pedal edema, cyanosis, or clubbing.  NEUROLOGIC: Cranial nerves II through XII are intact. Muscle strength 5/5 in all extremities. Sensation intact. Gait not checked.  PSYCHIATRIC: The patient is alert and oriented x 3.  SKIN: No obvious rash, lesion, or ulcer.   LABORATORY PANEL:   CBC  Recent Labs Lab 03/10/15 1724  WBC 6.8  HGB 6.5*  HCT 20.4*  PLT 221  MCV 85.5  MCH 27.1  MCHC 31.7*  RDW 15.0*   ------------------------------------------------------------------------------------------------------------------  Chemistries   Recent Labs Lab 03/10/15 1724  NA 140  K 4.2  CL 109  CO2 22  GLUCOSE 197*  BUN 19  CREATININE 1.18*  CALCIUM 9.0  AST 18  ALT 9*  ALKPHOS 43  BILITOT 0.4   ------------------------------------------------------------------------------------------------------------------  Cardiac Enzymes No results for input(s): TROPONINI in the last 168 hours. ------------------------------------------------------------------------------------------------------------------  RADIOLOGY: Dg Chest Portable 1 View  03/10/2015   CLINICAL  DATA:  Acute onset of dizziness and shortness of breath. Generalized fatigue and dark tarry stools. Initial encounter.  EXAM: PORTABLE CHEST - 1 VIEW  COMPARISON:  Chest radiograph performed 01/05/2014  FINDINGS: The lungs are well-aerated and clear. There is no evidence of focal opacification, pleural effusion or pneumothorax.  The cardiomediastinal silhouette is within normal limits. No acute osseous abnormalities are seen.  IMPRESSION: No acute cardiopulmonary process seen.   Electronically Signed   By: Garald Balding M.D.   On: 03/10/2015 18:03    EKG: Orders placed or performed in visit on 09/04/09  . Converted [facility] EKG    IMPRESSION AND PLAN: *Acute Posthemorrhagic anemia. Admit  patient to medical floor. Continue Protonix IV. Get gastroenterology consultation for likely EGD, follow hemoglobin level every 6 hours 3. Transfuse patient 1 unit packed red blood cells and follow hemoglobin level after transfusion and in the morning *Gastrointestinal bleed of unclear etiology, likely related to a known gastric and duodenal  Angiectasias.  Get gastroenterology consultation for likely EGD, follow hemoglobin level every 6 hours 3.  *Diabetes mellitus. Continue patient on sliding scale insulin as patient will be nothing by mouth  *Renal insufficiency, likely due to dehydration. The patient on IV fluids and follow kidney function in the morning. *Elevated troponin, likely demand ischemia. Would initiate patient on beta blockers if  her blood pressure is better, no aspirin or heparin can be used due to gastrointestinal bleed. Follow patient's cardiac enzymes 3 and get cardiology consultation if needed. *Tobacco abuse. Discussed this patient for approximately 4 minutes. Nicotine replacement therapy will be initiated  All the records are reviewed and case discussed with ED provider. Management plans discussed with the patient, family and they are in agreement.  CODE STATUS:    TOTAL TIME TAKING CARE OF THIS PATIENT: 55 minutes.    Theodoro Grist M.D on 03/10/2015 at 7:35 PM  Between 7am to 6pm - Pager - [phone] After 6pm go to [email or web address] - password [email or web address] [facility]  Office  [phone]  CC: Primary care physician; Viviana Simpler, MD

## 2015-03-10 NOTE — ED Provider Notes (Signed)
[facility] Emergency Department Provider Note  ____________________________________________  Time seen: Approximately 5:21 PM  I have reviewed the triage vital signs and the nursing notes.   HISTORY  Chief Complaint Shortness of Breath    HPI Kathryn Ware is a 74 y.o. female with a history that includes prior GI bleeding and necessity for blood transfusion years ago presents with increasing weakness and fatigue over 1-2 weeks.  Over the last few days she has also had shortness of breath with exertion.  She had a Lexiscan last year that did not require any cardiac intervention.  She denies chest pain, abdominal pain, nausea, and vomiting.  She does note that she has had black and tarry stools for about 2 weeks.  Her primary care doctor is Dr. Silvio Pate; she has not discussed these symptoms with him.   Past Medical History  Diagnosis Date  . Diabetes mellitus type II   . HTN (hypertension)   . HLD (hyperlipidemia)   . RLS (restless legs syndrome)   . GI AVM (gastrointestinal arteriovenous vascular malformation)     stomach  . Chest pain 3/15    Echo normal except mild diastolic dysfunction. Low risk myocardial scan    Patient Active Problem List   Diagnosis Date Noted  . anemia 03/10/2015  . Acute posthemorrhagic anemia 03/10/2015  . Anemia due to gastrointestinal blood loss 02/18/2015  . Advance directive discussed with patient 02/18/2015  . Nicotine dependence 10/29/2013  . Routine general medical examination at a health care facility 08/17/2011  . Diabetes mellitus type 2, controlled, without complications 62/22/9798  . HYPERCHOLESTEROLEMIA 02/02/2007  . Essential hypertension, benign 02/02/2007  . Angiodysplasia of intestinal tract 02/02/2007    Past Surgical History  Procedure Laterality Date  . Abdominal hysterectomy  1976    RSO (fibroid)  . Top    . Abdominal adhesion surgery  11/2001    Dr. Tamala Julian  . Esophagogastroduodenoscopy  07/2006     multiple angioectasias  . Doppler echocardiography      EF 55%, mild MR, TR  . Dobutamine stress echo  11/10    normal  . Laryngeal polyp  10/2008    Dr. Tami Ribas    Current Outpatient Rx  Name  Route  Sig  Dispense  Refill  . ferrous sulfate 325 (65 FE) MG tablet   Oral   Take 325 mg by mouth daily with breakfast.           . glipiZIDE (GLUCOTROL XL) 10 MG 24 hr tablet      TAKE 1 TABLET BY MOUTH TWICE DAILY   60 tablet   11   . lisinopril (PRINIVIL,ZESTRIL) 40 MG tablet   Oral   Take 40 mg by mouth daily.         Marland Kitchen lisinopril-hydrochlorothiazide (PRINZIDE,ZESTORETIC) 20-12.5 MG per tablet   Oral   Take 2 tablets by mouth daily.   60 tablet   11   . metFORMIN (GLUCOPHAGE-XR) 500 MG 24 hr tablet      TAKE 1 TABLET BY MOUTH TWICE DAILY   60 tablet   11   . omeprazole (PRILOSEC) 20 MG capsule      TAKE 1 CAPSULE BY MOUTH EVERY DAY   90 capsule   3   . simvastatin (ZOCOR) 80 MG tablet      TAKE 1 TABLET BY MOUTH EVERY DAY.   90 tablet   1   . sucralfate (CARAFATE) 1 G tablet      TAKE  1 TABLET BY MOUTH BEFORE MEALS AND EVERY NIGHT AT BEDTIME   120 tablet   0   . vitamin B-12 (CYANOCOBALAMIN) 1000 MCG tablet   Oral   Take 1,000 mcg by mouth daily.         . ONE TOUCH ULTRA TEST test strip      AS DIRECTED TO TEST BLOOD SUGAR EVERY DAY.   100 each   1     Dx E11.40     Allergies Metformin and Nifedipine  Family History  Problem Relation Age of Onset  . Stroke Father   . Hypertension Mother   . Hypertension      sibling  . Diabetes      grandmother  . Cancer Sister     breast cancer    Social History History  Substance Use Topics  . Smoking status: Current Every Day Smoker  . Smokeless tobacco: Never Used  . Alcohol Use: No    Review of Systems Constitutional: No fever/chills Eyes: No visual changes. ENT: No sore throat. Cardiovascular: Denies chest pain. Respiratory: shortness of breath with  exertion Gastrointestinal: No abdominal pain.  No nausea, no vomiting.  No diarrhea.  No constipation.  Black and tarry stools Genitourinary: Negative for dysuria. Musculoskeletal: Negative for back pain. Skin: Negative for rash. Neurological: Negative for headaches, focal weakness or numbness.  10-point ROS otherwise negative.  ____________________________________________   PHYSICAL EXAM:  VITAL SIGNS: ED Triage Vitals  Enc Vitals Group     BP 03/10/15 1703 140/54 mmHg     Pulse Rate 03/10/15 1703 112     Resp --      Temp 03/10/15 1703 98.1 F (36.7 C)     Temp Source 03/10/15 1703 Oral     SpO2 03/10/15 1703 98 %     Weight 03/10/15 1703 125 lb (56.7 kg)     Height 03/10/15 1703 '5\' 5"'$  (1.651 m)     Head Cir --      Peak Flow --      Pain Score --      Pain Loc --      Pain Edu? --      Excl. in [place]? --     Constitutional: Alert and oriented. Well appearing and in no acute distress. Eyes: Pale Conjunctivae. PERRL. EOMI. Head: Atraumatic. Nose: No congestion/rhinnorhea. Mouth/Throat: Mucous membranes are moist.  Oropharynx non-erythematous. Neck: No stridor.   Cardiovascular: Borderline tachycardia, regular rhythm. Grossly normal heart sounds.  Good peripheral circulation. Respiratory: Normal respiratory effort.  No retractions. Lungs CTAB. Gastrointestinal: Soft and nontender. No distention. No abdominal bruits. No CVA tenderness. Rectal:  The patient has gross melena on exam with strongly positive Hemoccult.  The exam was nontender. Musculoskeletal: No lower extremity tenderness nor edema.  No joint effusions. Neurologic:  Normal speech and language. No gross focal neurologic deficits are appreciated. Speech is normal. No gait instability. Skin:  Skin is warm, dry and intact. No rash noted. Psychiatric: Mood and affect are normal. Speech and behavior are normal.  ____________________________________________   LABS (all labs ordered are listed, but only abnormal  results are displayed)  Labs Reviewed  CBC - Abnormal; Notable for the following:    RBC 2.38 (*)    Hemoglobin 6.5 (*)    HCT 20.4 (*)    MCHC 31.7 (*)    RDW 15.0 (*)    All other components within normal limits  COMPREHENSIVE METABOLIC PANEL - Abnormal; Notable for the following:    Glucose,  Bld 197 (*)    Creatinine, Ser 1.18 (*)    ALT 9 (*)    GFR calc non Af Amer 45 (*)    GFR calc Af Amer 52 (*)    All other components within normal limits  TROPONIN I - Abnormal; Notable for the following:    Troponin I 0.07 (*)    All other components within normal limits  HEMOGLOBIN  HEMOGLOBIN  HEMOGLOBIN  TYPE AND SCREEN  ABO/RH  PREPARE RBC (CROSSMATCH)   ____________________________________________  EKG  ED ECG REPORT   Date: 03/10/2015  EKG Time: 17:09  Rate: 101  Rhythm: sinus tachycardia  Axis: Normal  Intervals:none  ST&T Change: Approximately 1 mm of ST depression with T-wave inversions in leads V4 V5 and V6.  These changes were present and a prior EKG from Jan 25, 2014, but are more pronounced at this time.  ____________________________________________  RADIOLOGY  Not indicated  ____________________________________________   PROCEDURES  Procedure(s) performed: None  Critical Care performed: No  ____________________________________________   INITIAL IMPRESSION / ASSESSMENT AND PLAN / ED COURSE  Pertinent labs & imaging results that were available during my care of the patient were reviewed by me and considered in my medical decision making (see chart for details).  The patient has acute on chronic blood loss anemia from her gastrointestinal bleeding.  I will place 2 peripheral IVs, type and screen 3 units and began transfusion of 1 unit in the emergency department.  I consented the patient and her family for the transfusion.  I will also give her pantoprazole bolus and infusion given that this may be an upper GI source.  She is hemodynamically stable  other than mild tachycardia which should improve with the transfusion.  I am holding on normal saline bolus given that she is normotensive and needs blood, not crystalloid.  The patient's troponin is 0.07, which I feel is most likely demand ischemia given her acute anemia.  Obviously given that her primary problem is anemia and GI bleeding I will not give her a full dose aspirin nor blood thinner, especially given that she is having no chest pain nor clinically significant shortness of breath.  However, I recommend cardiology consultation during her admission. ____________________________________________   FINAL CLINICAL IMPRESSION(S) / ED DIAGNOSES  Final diagnoses:  Acute blood loss anemia  Gastrointestinal hemorrhage with melena  Troponinemia   Hinda Kehr, MD 03/10/15 [phone]

## 2015-03-10 NOTE — ED Notes (Signed)
Today pt reports that she has been feeling intermittant dizziness and SOB, Pt also c/o feeling general fatigue for the past week,   Dark tarry stools x 2 weeks.

## 2015-03-11 ENCOUNTER — Inpatient Hospital Stay (HOSPITAL_COMMUNITY)
Admit: 2015-03-11 | Discharge: 2015-03-11 | Disposition: A | Discharge status: Home / Self Care | Payer: Medicare Other | Attending: Internal Medicine | Admitting: Internal Medicine

## 2015-03-11 ENCOUNTER — Encounter: Payer: Self-pay | Admitting: *Deleted

## 2015-03-11 DIAGNOSIS — E785 Hyperlipidemia, unspecified: Secondary | ICD-10-CM

## 2015-03-11 DIAGNOSIS — R0602 Shortness of breath: Secondary | ICD-10-CM

## 2015-03-11 DIAGNOSIS — I255 Ischemic cardiomyopathy: Secondary | ICD-10-CM

## 2015-03-11 DIAGNOSIS — D62 Acute posthemorrhagic anemia: Secondary | ICD-10-CM

## 2015-03-11 DIAGNOSIS — I10 Essential (primary) hypertension: Secondary | ICD-10-CM

## 2015-03-11 LAB — HEMOGLOBIN A1C: Hgb A1c MFr Bld: 6.9 % — ABNORMAL HIGH (ref 4.0–6.0)

## 2015-03-11 LAB — BASIC METABOLIC PANEL
Anion gap: 3 — ABNORMAL LOW (ref 5–15)
BUN: 13 mg/dL (ref 6–20)
CALCIUM: 7.3 mg/dL — AB (ref 8.9–10.3)
CO2: 22 mmol/L (ref 22–32)
Chloride: 118 mmol/L — ABNORMAL HIGH (ref 101–111)
Creatinine, Ser: 0.96 mg/dL (ref 0.44–1.00)
GFR calc Af Amer: >60 mL/min (ref >60)
GFR calc non Af Amer: 57 mL/min — ABNORMAL LOW (ref >60)
Glucose, Bld: 51 mg/dL — ABNORMAL LOW (ref 65–99)
Potassium: 3.6 mmol/L (ref 3.5–5.1)
Sodium: 143 mmol/L (ref 135–145)

## 2015-03-11 LAB — GLUCOSE, CAPILLARY
GLUCOSE-CAPILLARY: 66 mg/dL (ref 65–99)
GLUCOSE-CAPILLARY: 51 mg/dL — AB (ref 65–99)
GLUCOSE-CAPILLARY: 145 mg/dL — AB (ref 65–99)
Glucose-Capillary: 71 mg/dL (ref 65–99)
Glucose-Capillary: 83 mg/dL (ref 65–99)
Glucose-Capillary: 84 mg/dL (ref 65–99)
Glucose-Capillary: 155 mg/dL — ABNORMAL HIGH (ref 65–99)

## 2015-03-11 LAB — CBC
HCT: 22.3 % — ABNORMAL LOW (ref 35.0–47.0)
HEMOGLOBIN: 7.2 g/dL — AB (ref 12.0–16.0)
MCH: 28.6 pg (ref 26.0–34.0)
MCHC: 32.3 g/dL (ref 32.0–36.0)
MCV: 88.3 fL (ref 80.0–100.0)
Platelets: 176 10*3/uL (ref 150–440)
RBC: 2.52 MIL/uL — ABNORMAL LOW (ref 3.80–5.20)
RDW: 16.2 % — AB (ref 11.5–14.5)
WBC: 7.7 10*3/uL (ref 3.6–11.0)

## 2015-03-11 LAB — MAGNESIUM: Magnesium: 1.6 mg/dL — ABNORMAL LOW (ref 1.7–2.4)

## 2015-03-11 LAB — TSH: TSH: 1.456 u[IU]/mL (ref 0.350–4.500)

## 2015-03-11 LAB — TROPONIN I
TROPONIN I: 0.09 ng/mL — AB (ref <0.031)
Troponin I: 0.08 ng/mL — ABNORMAL HIGH (ref <0.031)

## 2015-03-11 LAB — ABO/RH: ABO/RH(D): B POS

## 2015-03-11 MED ORDER — CARVEDILOL 3.125 MG PO TABS
3.1250 mg | ORAL_TABLET | Freq: Two times a day (BID) | ORAL | Status: DC
  Administered 2015-03-11 – 2015-03-12 (×2): 3.125 mg via ORAL
  Filled 2015-03-11 (×2): qty 1

## 2015-03-11 MED ORDER — DEXTROSE-NACL 5-0.9 % IV SOLN
INTRAVENOUS | Status: DC
  Administered 2015-03-11 – 2015-03-12 (×4): via INTRAVENOUS

## 2015-03-11 NOTE — Consult Note (Signed)
GI Inpatient Consult Note  Reason for Consult: Gastrointestinal Bleed of Unclear Etiology   Attending Requesting Consult: Zada Finders. D.   History of Present Illness: Kathryn Ware is a 74 y.o. female who presented to [facility] [place] on 03/10/2015 with complaints of fatigue, dizziness and lightheadedness.  Her past medical history is significant for Multiple gastric and duodenal angiectasias noted on EGD in 2007 by Dr. Gustavo Lah as noted in her H & P per Dr. Ether Griffins. I do not have access to the EGD report at this time.  She also has a hsitory of Gastrointestinal Arteriovenous Vascular Malformation ( stomach ) and a history of Anemia.  She reports noticing dark stools for a few months.  She didn't think anything of it because she has been taking FeSO4 for 5-6 years.  She presented to the ER because she had a large dark stool on yesterday morning with subsequent generalized fatigue.  States she wasn't able to get up and walk a short distance without becoming short of breath and really tired. She denies use of NSAIDs. Denies Hematemesis, abdominal pain, constipation or diarrhea.    In Emergency room, her hemoglobin was noted to be 6.5. She was tachycaric and tachypneic.  Her blood pressure was normal at the time.  Protonix IV infusion was started. She remains on it at 25 ml/hr.  She received one unit of PRBCs.  Post-transfusion hemoglobin of 7.2 is noted.  She has not had a bowel movement since admission.  No hematemesis or abdominal pain since admission for gastrointestinal bleeding of unclear etiology.  Dr. Candace Cruise has been consulted for evaluation.      Past Medical History:  Past Medical History  Diagnosis Date  . Diabetes mellitus type II   . HTN (hypertension)   . HLD (hyperlipidemia)   . RLS (restless legs syndrome)   . GI AVM (gastrointestinal arteriovenous vascular malformation)     stomach  . History of echocardiogram 3/15    a. 12/2013: EF 60-65%, DD, mild LVH, nl  RV size & systolic function, mild MR/TR, mild AS, nl RVSP  . History of nuclear stress test     a. 12/2013: low risk, no sig WMA, nondiag EKG 2/2 baseline LVH w/ repol abnl, no sig ischemia, EF 70% no artifact    Problem List: Patient Active Problem List   Diagnosis Date Noted  . anemia 03/10/2015  . Acute posthemorrhagic anemia 03/10/2015  . Anemia due to gastrointestinal blood loss 02/18/2015  . Advance directive discussed with patient 02/18/2015  . Nicotine dependence 10/29/2013  . Routine general medical examination at a health care facility 08/17/2011  . Diabetes mellitus type 2, controlled, without complications 78/29/5621  . HYPERCHOLESTEROLEMIA 02/02/2007  . Essential hypertension, benign 02/02/2007  . Angiodysplasia of intestinal tract 02/02/2007    Past Surgical History: Past Surgical History  Procedure Laterality Date  . Abdominal hysterectomy  1976    RSO (fibroid)  . Top    . Abdominal adhesion surgery  11/2001    Dr. Tamala Julian  . Esophagogastroduodenoscopy  07/2006    multiple angioectasias  . Doppler echocardiography      EF 55%, mild MR, TR  . Dobutamine stress echo  11/10    normal  . Laryngeal polyp  10/2008    Dr. Tami Ribas    Allergies: Allergies  Allergen Reactions  . Metformin     REACTION: diarrhea  . Nifedipine     REACTION: cough    Home Medications: Prescriptions prior  to admission  Medication Sig Dispense Refill Last Dose  . ferrous sulfate 325 (65 FE) MG tablet Take 325 mg by mouth daily with breakfast.     Taking  . glipiZIDE (GLUCOTROL XL) 10 MG 24 hr tablet TAKE 1 TABLET BY MOUTH TWICE DAILY 60 tablet 11 Taking  . lisinopril (PRINIVIL,ZESTRIL) 40 MG tablet Take 40 mg by mouth daily.     Marland Kitchen lisinopril-hydrochlorothiazide (PRINZIDE,ZESTORETIC) 20-12.5 MG per tablet Take 2 tablets by mouth daily. 60 tablet 11   . metFORMIN (GLUCOPHAGE-XR) 500 MG 24 hr tablet TAKE 1 TABLET BY MOUTH TWICE DAILY 60 tablet 11 03/10/2015 at Unknown time  . omeprazole  (PRILOSEC) 20 MG capsule TAKE 1 CAPSULE BY MOUTH EVERY DAY 90 capsule 3 Taking  . simvastatin (ZOCOR) 80 MG tablet TAKE 1 TABLET BY MOUTH EVERY DAY. 90 tablet 1 Taking  . sucralfate (CARAFATE) 1 G tablet TAKE 1 TABLET BY MOUTH BEFORE MEALS AND EVERY NIGHT AT BEDTIME 120 tablet 0 Taking  . vitamin B-12 (CYANOCOBALAMIN) 1000 MCG tablet Take 1,000 mcg by mouth daily.     . ONE TOUCH ULTRA TEST test strip AS DIRECTED TO TEST BLOOD SUGAR EVERY DAY. 100 each 1 Taking   Home medication reconciliation was completed with the patient.   Scheduled Inpatient Medications:   . atorvastatin  40 mg Oral q1800  . carvedilol  3.125 mg Oral BID WC  . insulin aspart  0-5 Units Subcutaneous QHS  . insulin aspart  0-9 Units Subcutaneous TID WC  . nicotine  21 mg Transdermal Daily    Continuous Inpatient Infusions:   . sodium chloride 100 mL/hr at 03/11/15 0600  . pantoprozole (PROTONIX) infusion 8 mg/hr (03/11/15 0846)    PRN Inpatient Medications:  metoprolol, nicotine, ondansetron **OR** ondansetron (ZOFRAN) IV  Family History: family history includes Cancer in her sister; Diabetes in an other family member; Hypertension in her mother and another family member; Stroke in her father.  The patient's family history is negative for inflammatory bowel disorders, GI malignancy, or solid organ transplantation.  Social History:   reports that she has been smoking.  She has never used smokeless tobacco. She reports that she does not drink alcohol or use illicit drugs. The patient denies ETOH, tobacco, or drug use.    Review of Systems: Constitutional: Weight is stable. Fatigue has improved since transfusion of PRBCs. Eyes: No changes in vision. ENT: No oral lesions, sore throat.  GI: see HPI.  Heme/Lymph: No easy bruising.  CV: No chest pain.  GU: No hematuria.  Integumentary: No rashes.  Neuro: No headaches.  Psych: No depression/anxiety.  Endocrine: No heat/cold intolerance.  Allergic/Immunologic:  No urticaria.  Resp: No cough, SOB since admission. Musculoskeletal: No joint swelling.    Physical Examination: BP 140/62 mmHg  Pulse 88  Temp(Src) 98.1 F (36.7 C) (Oral)  Resp 26  Ht 5' (1.524 m)  Wt 59.2 kg (130 lb 8.2 oz)  BMI 25.49 kg/m2  SpO2 97% Gen: NAD, alert and oriented x 4 HEENT:Normocephalic. Atraumatic, Pale Conjunctiva. EOMI, Oral mucosa moist.  Neck: supple, no JVD or thyromegaly. Chest: CTA bilaterally, no wheezes, crackles, or other adventitious sounds CV: RRR, no m/g/c/r Abd: soft, NT, ND, +BS in all four quadrants; no HSM, guarding, ridigity, or rebound tenderness Ext: no edema, well perfused with 2+ pulses, Skin: no rash or lesions noted Lymph: no LAD  Data: Lab Results  Component Value Date   WBC 7.7 03/11/2015   HGB 7.2* 03/11/2015   HCT 22.3* 03/11/2015  MCV 88.3 03/11/2015   PLT 176 03/11/2015    Recent Labs Lab 03/10/15 1724 03/10/15 2121 03/11/15 0422  HGB 6.5* 6.3* 7.2*   Lab Results  Component Value Date   NA 143 03/11/2015   K 3.6 03/11/2015   CL 118* 03/11/2015   CO2 22 03/11/2015   BUN 13 03/11/2015   CREATININE 0.96 03/11/2015   Lab Results  Component Value Date   ALT 9* 03/10/2015   AST 18 03/10/2015   ALKPHOS 43 03/10/2015   BILITOT 0.4 03/10/2015    Assessment/Plan: Ms. Fritts is a 74 y.o. female admitted with a diagnosis of Acute Posthemorrhagic Anemia.  She has a history of gastric, duodenal Angiectasias and Gastrointestinal arteriovenous vascular malformtion which could possibly be the source of a gastrointestinal bleed. She is hemodynamically stable. Hemoglobin is stable at 7.2 post transfusion of one unit of PRBCs.  Agree with Protonix infusion. Discussed patient with Dr. Candace Cruise.  Our plan is for patient to have an EGD in the morning since she unfortunately received Apple juice today.  I've discussed the EGD and potential complications such as perforation, infection and bleeding with patient. She agreed to proceed with  the EGD.    Recommendations:   Thank you for the consult. Please call with questions or concerns. Patient seen with Dr. Verdie Shire under collaborative agreement.  Faye Ramsay, MSN, FNP-BC

## 2015-03-11 NOTE — Consult Note (Addendum)
Cardiology Consultation Note  Patient ID: Kathryn Ware, MRN: [id number], DOB/AGE: 73-01-1941 73 y.o. Admit date: 03/10/2015   Date of Consult: 03/11/2015 Primary Physician: Viviana Simpler, MD Primary Cardiologist: New to [facility]  Chief Complaint: SOB Reason for Consult: Elevated troponin in the setting of profound anemia   HPI: 74 y.o. female with h/o DM2, HTN, HLD, iron deficiency anemia, GI AVM, GERD, and ongoing tobacco abuse who presented to [facility] with SOB, weight loss, and melena.   She was admitted to [facility] 12/2013 for atypical chest pain and underwent nuclear stress testing that showed No significant wall motion abnormalities, nondiagnostic EKG 2/2 baseline LVH repolarization abnormalities, no significant ischemia EF 70%, no artifact. Study was reported as low risk She also underwent an echo that showed an EF of 33-29%, diastolic dysfunction, mild MR/TR, mild aortic stenosis, normal RV size and systolic function, and normal RVSP. It was felt her symptoms were secondary to GI at that time. She has known GERD and GI AVM and has previously undergone GED in 2007 that revealed multiple gastric and duodenal angiectasias. She takes Prilosec daily at home.   She has noticed over the past year some increased weight loss and change in the stool color to a dark brown to melena color. She notes a 50 pound unintentional weight loss over this year. She thought it was 2/2 poor po intake. She felt like her stool changes were 2/2 chocolate. She notes increased fatigue and SOB. She denies any chest pain with any of the above. She does note some rare episodes of palpitations that will occur at rest and last a few seconds and self resolve. Based on her increased SOB, and increased fatigue to the level of presyncope at times, and dizziness with standing she presented to [facility] for further evaluation.   Upon her arrival to [facility] she was found to have a hgb/hct of 6.5/20.4, prior 7 months ago was 11.2/34.9. Albumin 3.8. SCr  1.18-->0.96. Troponins were checked and found to be flat trending at 0.07-->0.07-->0.09-->0.08. CXR showed no acute process. She has received 1 unit of pRBC with a hgb/hct rechecking this morning at 7.2/22.3. GI has been consulted. She currently denies any chest pain or SOB.       Past Medical History  Diagnosis Date  . Diabetes mellitus type II   . HTN (hypertension)   . HLD (hyperlipidemia)   . RLS (restless legs syndrome)   . GI AVM (gastrointestinal arteriovenous vascular malformation)     stomach  . History of echocardiogram 3/15    a. 12/2013: EF 60-65%, DD, mild LVH, nl RV size & systolic function, mild MR/TR, mild AS, nl RVSP  . History of nuclear stress test     a. 12/2013: low risk, no sig WMA, nondiag EKG 2/2 baseline LVH w/ repol abnl, no sig ischemia, EF 70% no artifact      Most Recent Cardiac Studies: Nuclear stress test 12/2013:   Low risk study No significant wall motion abnormalities, nondiagnostic EKG 2/2 baseline LVH repolarization abnormalities, no significant ischemia EF 70%, no artifact   Echo 12/2013:  EF 51-88% Mild LVH Diastolic dysfunction Normal RV size and systolic function Mild MR/TR Mild aortic stenosis Normal RVSP   Surgical History:  Past Surgical History  Procedure Laterality Date  . Abdominal hysterectomy  1976    RSO (fibroid)  . Top    . Abdominal adhesion surgery  11/2001    Dr. Tamala Julian  . Esophagogastroduodenoscopy  07/2006    multiple angioectasias  .  Doppler echocardiography      EF 55%, mild MR, TR  . Dobutamine stress echo  11/10    normal  . Laryngeal polyp  10/2008    Dr. Tami Ribas     Home Meds: Prior to Admission medications   Medication Sig Start Date End Date Taking? Authorizing Provider  ferrous sulfate 325 (65 FE) MG tablet Take 325 mg by mouth daily with breakfast.     Yes Historical Provider, MD  glipiZIDE (GLUCOTROL XL) 10 MG 24 hr tablet TAKE 1 TABLET BY MOUTH TWICE DAILY 02/16/15  Yes Venia Carbon, MD    lisinopril (PRINIVIL,ZESTRIL) 40 MG tablet Take 40 mg by mouth daily.   Yes Historical Provider, MD  lisinopril-hydrochlorothiazide (PRINZIDE,ZESTORETIC) 20-12.5 MG per tablet Take 2 tablets by mouth daily. 02/18/15  Yes Venia Carbon, MD  metFORMIN (GLUCOPHAGE-XR) 500 MG 24 hr tablet TAKE 1 TABLET BY MOUTH TWICE DAILY 04/03/14  Yes Venia Carbon, MD  omeprazole (PRILOSEC) 20 MG capsule TAKE 1 CAPSULE BY MOUTH EVERY DAY 12/05/14  Yes Venia Carbon, MD  simvastatin (ZOCOR) 80 MG tablet TAKE 1 TABLET BY MOUTH EVERY DAY. 09/16/14  Yes Venia Carbon, MD  sucralfate (CARAFATE) 1 G tablet TAKE 1 TABLET BY MOUTH BEFORE MEALS AND EVERY NIGHT AT BEDTIME   Yes Venia Carbon, MD  vitamin B-12 (CYANOCOBALAMIN) 1000 MCG tablet Take 1,000 mcg by mouth daily.   Yes Historical Provider, MD  ONE TOUCH ULTRA TEST test strip AS DIRECTED TO TEST BLOOD SUGAR EVERY DAY. 12/15/14   Venia Carbon, MD    Inpatient Medications:  . atorvastatin  40 mg Oral q1800  . carvedilol  3.125 mg Oral BID WC  . insulin aspart  0-5 Units Subcutaneous QHS  . insulin aspart  0-9 Units Subcutaneous TID WC  . nicotine  21 mg Transdermal Daily   . sodium chloride 100 mL/hr at 03/11/15 0600  . pantoprozole (PROTONIX) infusion 8 mg/hr (03/11/15 0846)    Allergies:  Allergies  Allergen Reactions  . Metformin     REACTION: diarrhea  . Nifedipine     REACTION: cough    History   Social History  . Marital Status: Widowed    Spouse Name: N/A  . Number of Children: 1  . Years of Education: N/A   Occupational History  . 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 Smoking status: Current Every Day Smoker -- 0.50 packs/day  . Smokeless tobacco: Never Used  . Alcohol Use: No  . Drug Use: No  . Sexual Activity: Not on file   Other Topics Concern  . Not on file   Social History Narrative   No living will   Requests son Orson Gear as health  care POA   Would accept resuscitation    Not sure about feeding tubes     Family History  Problem Relation Age of Onset  . Stroke Father   . Hypertension Mother   . Hypertension      sibling  . Diabetes      grandmother  . Cancer Sister     breast cancer     Review of Systems: Review of Systems  Constitutional: Positive for weight loss and malaise/fatigue. Negative for fever, chills and diaphoresis.  HENT: Negative for congestion, hearing loss, nosebleeds, sore throat and tinnitus.   Eyes: Negative for blurred vision, double vision, photophobia, pain, discharge and redness.  Respiratory: Negative for cough, hemoptysis, sputum production, shortness of breath, wheezing and stridor.   Cardiovascular: Positive for palpitations. Negative for chest pain, orthopnea, claudication, leg swelling and PND.  Gastrointestinal: Positive for heartburn and melena. Negative for nausea, vomiting, abdominal pain, diarrhea, constipation and blood in stool.  Genitourinary: Negative for dysuria, urgency, frequency, hematuria and flank pain.  Musculoskeletal: Negative for myalgias, back pain, joint pain, falls and neck pain.  Skin: Negative for itching and rash.  Neurological: Positive for weakness. Negative for dizziness, tingling, tremors, speech change, focal weakness, seizures, loss of consciousness and headaches.  Endo/Heme/Allergies: Does not bruise/bleed easily.  Psychiatric/Behavioral: Negative for depression, suicidal ideas, hallucinations, memory loss and substance abuse. The patient is not nervous/anxious and does not have insomnia.   All other systems were reviewed and negative.   Labs:  Recent Labs  03/10/15 1724 03/10/15 2121 03/10/15 2350 03/11/15 0422  TROPONINI 0.07* 0.07* 0.09* 0.08*   Lab Results  Component Value Date   WBC 7.7 03/11/2015   HGB 7.2* 03/11/2015   HCT 22.3* 03/11/2015   MCV 88.3 03/11/2015   PLT 176 03/11/2015     Recent Labs Lab 03/10/15 1724  03/11/15 0422  NA 140 143  K 4.2 3.6  CL 109 118*  CO2 22 22  BUN 19 13  CREATININE 1.18* 0.96  CALCIUM 9.0 7.3*  PROT 6.8  --   BILITOT 0.4  --   ALKPHOS 43  --   ALT 9*  --   AST 18  --   GLUCOSE 197* 51*   Lab Results  Component Value Date   CHOL 167 07/29/2014   HDL 37.80* 07/29/2014   LDLCALC 112* 07/29/2014   TRIG 84.0 07/29/2014   No results found for: DDIMER  Radiology/Studies:  Dg Chest Portable 1 View  03/10/2015   CLINICAL DATA:  Acute onset of dizziness and shortness of breath. Generalized fatigue and dark tarry stools. Initial encounter.  EXAM: PORTABLE CHEST - 1 VIEW  COMPARISON:  Chest radiograph performed 01/05/2014  FINDINGS: The lungs are well-aerated and clear. There is no evidence of focal opacification, pleural effusion or pneumothorax.  The cardiomediastinal silhouette is within normal limits. No acute osseous abnormalities are seen.  IMPRESSION: No acute cardiopulmonary process seen.   Electronically Signed   By: Garald Balding M.D.   On: 03/10/2015 18:03    EKG: sinus tachycardia, 101 bpm, TWI I, II, V4-V6, st sagging aVF  Weights: Filed Weights   03/10/15 1703 03/10/15 2100 03/11/15 0500  Weight: 125 lb (56.7 kg) 130 lb 8.2 oz (59.2 kg) 130 lb 8.2 oz (59.2 kg)     Physical Exam: Blood pressure 139/61, pulse 84, temperature 98.1 F (36.7 C), temperature source Oral, resp. rate 21, height 5' (1.524 m), weight 130 lb 8.2 oz (59.2 kg), SpO2 97 %. Body mass index is 25.49 kg/(m^2). General: Well developed, well nourished, in no acute distress. Head: Normocephalic, atraumatic, sclera non-icteric, no xanthomas, nares are without discharge. Pale conjunctiva bilaterally.   Neck: Negative for carotid bruits. JVD not elevated. Lungs: Clear bilaterally to auscultation without wheezes, rales, or rhonchi. Breathing is unlabored. Heart: RRR with S1 S2. 2/6 flow murmur. No rubs, or gallops appreciated. Abdomen: Soft, non-tender, non-distended with normoactive  bowel sounds. No hepatomegaly. No rebound/guarding. No obvious abdominal masses. Msk:  Strength and tone appear normal for age. Extremities: No clubbing or cyanosis. No edema.   Neuro: Alert and oriented X 3. No facial asymmetry. No focal deficit. Moves all extremities spontaneously. Psych:  Responds to questions appropriately with a normal affect.    Assessment and Plan:  74 y.o. female with h/o DM2, HTN, HLD, iron deficiency anemia, GI AVM, GERD, and ongoing tobacco abuse who presented to [facility] with SOB, weight loss, and melena found to have acute on chronic anemia and mildly elevated troponin likely consistent with supply demand ischemia.  1. Demand ischemia: -Likely in the setting of her acute on chronic anemia, presenting with a hgb of 6.5. Last known hgb was 11.2 on 07/2014 -Troponin mildly elevated and flat trending  -No anginal symptoms -Normal nuclear stress test and echo 12/2013 as above -No ischemic evaluation planned at this time given her lack of symptoms -Acute anemia precludes treatment with aspirin at this time -Add Coreg 3.125 mg bid given her mildly elevated BP  -Continue Lipitor 40 mg  2. Acute on chronic anemia: -She reports a history of melena, though is unable to tell me for how long -Status post transfusion 1 unit pRBC -Continue to monitor hgb/hct, goal hgb of 8-8.5 -SOB, increased fatigue, and orthostasis ikely 2/2 to the above -Should her symptoms persist once her anemia is corrected/identified will require further investigation  -GI to see patient   3. Acute renal insufficiency: -Resolved  4. DM2: -Per IM  5. HTN: -Add Coreg as above -She is on lisinopril/HCTZ at home  6. Ongoing tobacco abuse: Cessation is advised  7. HLD: -Lipitor 40 mg    SignedChristell Faith, PA-C Pager: [phone] 03/11/2015, 11:54 AM    Attending Note, Patient seen and examined, agree with detailed note above,  Patient presentation and plan discussed on rounds.    Minimal troponin elevation in the setting of profound anemia She is asymptomatic, no dramatic changes on EKG Prior stress test and echocardiogram were essentially normal No further testing needed at this time. Supportive care for anemia and GI blood loss  Esmond Plants  M.D., Ph.D. Signed 03/11/15 7:10 pm

## 2015-03-11 NOTE — Progress Notes (Addendum)
[place] [facility] [place] NAME: Kathryn Ware    MR#:  [id number]  DATE OF BIRTH:  [birth year]  SUBJECTIVE:  Some sob, fatigue. Denies any obvious bleeding.   REVIEW OF SYSTEMS:  Review of Systems  Constitutional: Positive for malaise/fatigue. Negative for fever, chills and weight loss.  HENT: Negative for nosebleeds and sore throat.   Eyes: Negative for blurred vision.  Respiratory: Negative for cough, shortness of breath and wheezing.   Cardiovascular: Negative for chest pain, orthopnea, leg swelling and PND.  Gastrointestinal: Negative for heartburn, nausea, vomiting, abdominal pain, diarrhea and constipation.  Genitourinary: Negative for dysuria and urgency.  Musculoskeletal: Negative for back pain.  Skin: Negative for rash.  Neurological: Positive for weakness. Negative for dizziness, speech change, focal weakness and headaches.  Endo/Heme/Allergies: Does not bruise/bleed easily.  Psychiatric/Behavioral: Negative for depression.    DRUG ALLERGIES:   Allergies  Allergen Reactions  . Metformin     REACTION: diarrhea  . Nifedipine     REACTION: cough    VITALS:  Blood pressure 139/61, pulse 84, temperature 98.1 F (36.7 C), temperature source Oral, resp. rate 21, height 5' (1.524 m), weight 59.2 kg (130 lb 8.2 oz), SpO2 97 %.  PHYSICAL EXAMINATION:  GENERAL:  74 y.o.-year-old patient lying in the bed with no acute distress.  EYES: Pupils equal, round, reactive to light and accommodation. No scleral icterus. Extraocular muscles intact.  HEENT: Head atraumatic, normocephalic. Oropharynx and nasopharynx clear.  NECK:  Supple, no jugular venous distention. No thyroid enlargement, no tenderness.  LUNGS: Normal breath sounds bilaterally, no wheezing, rales,rhonchi or crepitation. No use of accessory muscles of respiration.  CARDIOVASCULAR: S1, S2 normal. No murmurs, rubs, or gallops.  ABDOMEN: Soft, nontender, nondistended. Bowel  sounds present. No organomegaly or mass.  EXTREMITIES: No pedal edema, cyanosis, or clubbing.  NEUROLOGIC: Cranial nerves II through XII are intact. Muscle strength 5/5 in all extremities. Sensation intact. Gait not checked.  PSYCHIATRIC: The patient is alert and oriented x 3.  SKIN: No obvious rash, lesion, or ulcer.    LABORATORY PANEL:   CBC  Recent Labs Lab 03/11/15 0422  WBC 7.7  HGB 7.2*  HCT 22.3*  PLT 176   ------------------------------------------------------------------------------------------------------------------  Chemistries   Recent Labs Lab 03/10/15 1724 03/11/15 0422  NA 140 143  K 4.2 3.6  CL 109 118*  CO2 22 22  GLUCOSE 197* 51*  BUN 19 13  CREATININE 1.18* 0.96  CALCIUM 9.0 7.3*  AST 18  --   ALT 9*  --   ALKPHOS 43  --   BILITOT 0.4  --    ------------------------------------------------------------------------------------------------------------------  Cardiac Enzymes  Recent Labs Lab 03/11/15 0422  TROPONINI 0.08*   ------------------------------------------------------------------------------------------------------------------  RADIOLOGY:  Dg Chest Portable 1 View  03/10/2015   CLINICAL DATA:  Acute onset of dizziness and shortness of breath. Generalized fatigue and dark tarry stools. Initial encounter.  EXAM: PORTABLE CHEST - 1 VIEW  COMPARISON:  Chest radiograph performed 01/05/2014  FINDINGS: The lungs are well-aerated and clear. There is no evidence of focal opacification, pleural effusion or pneumothorax.  The cardiomediastinal silhouette is within normal limits. No acute osseous abnormalities are seen.  IMPRESSION: No acute cardiopulmonary process seen.   Electronically Signed   By: Garald Balding M.D.   On: 03/10/2015 18:03    ASSESSMENT AND PLAN:   * Acute Posthemorrhagic anemia: Continue Protonix IV. Await gastroenterology consultation - may need EGD. Hb 7.2 after 1 unit packed red  blood cells transfusion  *  Gastrointestinal bleed of unclear etiology, likely related to a known gastric and duodenal Angiectasias. await gastroenterology consultation for likely EGD.   * Hypoglycemia with h/o Diabetes mellitus. Continue patient on sliding scale insulin. Regular diet.  * Renal insufficiency, likely due to dehydration: continue IV fluids and follow kidney function in the morning.  * Elevated troponin, likely demand ischemia: no MI.  * Tobacco abuse. Discussed this patient for approximately 4 minutes. Nicotine replacement therapy will be initiated   All the records are reviewed and case discussed with Care Management/Social Workerr. Management plans discussed with the patient, family and they are in agreement.  CODE STATUS: full code  TOTAL TIME TAKING CARE OF THIS PATIENT: 35 minutes.   POSSIBLE D/C IN 2-3 DAYS, DEPENDING ON CLINICAL CONDITION.   [facility], Heston Widener M.D on 03/11/2015 at 11:54 AM  Between 7am to 6pm - Pager - [phone]  After 6pm go to [email or web address] - password [email or web address] [place] [facility]  Office  [phone]  CC: Primary care physician; Viviana Simpler, MD

## 2015-03-11 NOTE — Consult Note (Signed)
  GI Inpatient Follow-up Note  Patient Identification: Kathryn Ware is a 74 y.o. female with anemia.  Subjective:Pt seen and examined. Please see D. Martin's notes. Hx of dark stools but on Fe. Hgb low. Given blood transfusion. Known hx of gastric/duodenal AVM's from 2007. Actual report N/A to review. Was planning EGD this afternoon but had to be cancelled due to pt drinking apple juice for low glucose.  Scheduled Inpatient Medications:  . atorvastatin  40 mg Oral q1800  . carvedilol  3.125 mg Oral BID WC  . insulin aspart  0-5 Units Subcutaneous QHS  . insulin aspart  0-9 Units Subcutaneous TID WC  . nicotine  21 mg Transdermal Daily    Continuous Inpatient Infusions:   . dextrose 5 % and 0.9% NaCl 75 mL/hr at 03/11/15 1417  . pantoprozole (PROTONIX) infusion 8 mg/hr (03/11/15 0846)    PRN Inpatient Medications:  metoprolol, nicotine, ondansetron **OR** ondansetron (ZOFRAN) IV  Review of Systems: Constitutional: Weight is stable.  Eyes: No changes in vision. ENT: No oral lesions, sore throat.  GI: see HPI.  Heme/Lymph: No easy bruising.  CV: No chest pain.  GU: No hematuria.  Integumentary: No rashes.  Neuro: No headaches.  Psych: No depression/anxiety.  Endocrine: No heat/cold intolerance.  Allergic/Immunologic: No urticaria.  Resp: No cough, SOB.  Musculoskeletal: No joint swelling.    Physical Examination: BP 135/62 mmHg  Pulse 77  Temp(Src) 97.8 F (36.6 C) (Oral)  Resp 26  Ht 5' (1.524 m)  Wt 59.2 kg (130 lb 8.2 oz)  BMI 25.49 kg/m2  SpO2 98% Gen: NAD, alert and oriented x 4 HEENT: PEERLA, EOMI, Neck: supple, no JVD or thyromegaly Chest: CTA bilaterally, no wheezes, crackles, or other adventitious sounds CV: RRR, no m/g/c/r Abd: soft, NT, ND, +BS in all four quadrants; no HSM, guarding, ridigity, or rebound tenderness Ext: no edema, well perfused with 2+ pulses, Skin: no rash or lesions noted Lymph: no LAD  Data: Lab Results  Component Value Date    WBC 7.7 03/11/2015   HGB 7.2* 03/11/2015   HCT 22.3* 03/11/2015   MCV 88.3 03/11/2015   PLT 176 03/11/2015    Recent Labs Lab 03/10/15 1724 03/10/15 2121 03/11/15 0422  HGB 6.5* 6.3* 7.2*   Lab Results  Component Value Date   NA 143 03/11/2015   K 3.6 03/11/2015   CL 118* 03/11/2015   CO2 22 03/11/2015   BUN 13 03/11/2015   CREATININE 0.96 03/11/2015   Lab Results  Component Value Date   ALT 9* 03/10/2015   AST 18 03/10/2015   ALKPHOS 43 03/10/2015   BILITOT 0.4 03/10/2015   No results for input(s): APTT, INR, PTT in the last 168 hours. Assessment/Plan: Ms. Haecker is a 74 y.o. female with symptomatic anemia. Known hx of gastric/duodenal AVM's.   Recommendations:Clear liquid diet rest of today. NPO after MN. Plan EGD tomorrow. Thanks. Please call with questions or concerns.  Janaiya Beauchesne, Lupita Dawn, MD

## 2015-03-11 NOTE — Progress Notes (Signed)
Report called to Vilinda Boehringer, RN. @ [phone]. Pt belongings including purse, medicines, and clothes in pt belongings bag and transported with pt on bed. Naiyah Klostermann, RN 1:02 PM

## 2015-03-11 NOTE — Progress Notes (Signed)
Notified by tech that CBG was 51. 8oz of Apple Juice given to pt and CBG rechecked to be found in normal levels. Kathi Simpers, RN 03/11/15 12:31 PM

## 2015-03-11 NOTE — Progress Notes (Addendum)
Pt lying in bed resting quietly . Pt has no c/o pain or distress pt has received 1 unit of PRBC, tolerated well with no adverse reactions pt has had no active bleeding on this shift. . Pt cont. On protonix '@25ml'$ / NS '@100ml'$  Further assesment per flowsheet.

## 2015-03-11 NOTE — Plan of Care (Signed)
Problem: Consults Goal: Diabetes Guidelines if Diabetic/Glucose > 140 If diabetic or lab glucose is > 140 mg/dl - Initiate Diabetes/Hyperglycemia Guidelines & Document Interventions  Outcome: Not Progressing Patient transferred from CCU with diagnosis of GI bleed.  Patient having black tarry stools at home, none since admitted to hospital yesterday.  Received 1 unit of blood last night, tolerated well.  Hg improved from 6.4 to 7.2.  NPO after midnight have EGD tomorrow.  On d5ns at 61 and protonix drip.  Clear liquids until midnight.

## 2015-03-12 ENCOUNTER — Encounter
Admission: EM | Disposition: A | Discharge status: Home / Self Care | Payer: Self-pay | Source: Home / Self Care | Attending: Internal Medicine

## 2015-03-12 ENCOUNTER — Inpatient Hospital Stay: Payer: Medicare Other | Admitting: Anesthesiology

## 2015-03-12 HISTORY — PX: ESOPHAGOGASTRODUODENOSCOPY: SHX5428

## 2015-03-12 LAB — TYPE AND SCREEN
ABO/RH(D): B POS
Antibody Screen: NEGATIVE
Unit division: 0

## 2015-03-12 LAB — CBC
HEMATOCRIT: 24.5 % — AB (ref 35.0–47.0)
HEMOGLOBIN: 7.9 g/dL — AB (ref 12.0–16.0)
MCH: 28.3 pg (ref 26.0–34.0)
MCHC: 32.1 g/dL (ref 32.0–36.0)
MCV: 88.2 fL (ref 80.0–100.0)
Platelets: 197 10*3/uL (ref 150–440)
RBC: 2.78 MIL/uL — AB (ref 3.80–5.20)
RDW: 16.8 % — ABNORMAL HIGH (ref 11.5–14.5)
WBC: 5.6 10*3/uL (ref 3.6–11.0)

## 2015-03-12 LAB — GLUCOSE, CAPILLARY
GLUCOSE-CAPILLARY: 131 mg/dL — AB (ref 65–99)
GLUCOSE-CAPILLARY: 201 mg/dL — AB (ref 65–99)
GLUCOSE-CAPILLARY: 147 mg/dL — AB (ref 65–99)

## 2015-03-12 SURGERY — EGD (ESOPHAGOGASTRODUODENOSCOPY)
Anesthesia: General | Laterality: Left

## 2015-03-12 MED ORDER — FENTANYL CITRATE (PF) 100 MCG/2ML IJ SOLN
INTRAMUSCULAR | Status: DC | PRN
  Administered 2015-03-12: 50 ug via INTRAVENOUS

## 2015-03-12 MED ORDER — MIDAZOLAM HCL 2 MG/2ML IJ SOLN
INTRAMUSCULAR | Status: DC | PRN
  Administered 2015-03-12: 1 mg via INTRAVENOUS

## 2015-03-12 MED ORDER — GLYCOPYRROLATE 0.2 MG/ML IJ SOLN
INTRAMUSCULAR | Status: DC | PRN
  Administered 2015-03-12: 0.2 mg via INTRAVENOUS

## 2015-03-12 MED ORDER — PROPOFOL 10 MG/ML IV BOLUS
INTRAVENOUS | Status: DC | PRN
  Administered 2015-03-12: 30 mg via INTRAVENOUS

## 2015-03-12 MED ORDER — SODIUM CHLORIDE 0.9 % IV SOLN: INTRAVENOUS | Status: DC

## 2015-03-12 MED ORDER — SODIUM CHLORIDE 0.9 % IV SOLN
INTRAVENOUS | Status: DC
  Administered 2015-03-12: 11:00:00 via INTRAVENOUS

## 2015-03-12 MED ORDER — PROPOFOL INFUSION 10 MG/ML OPTIME
INTRAVENOUS | Status: DC | PRN
  Administered 2015-03-12: 75 ug/kg/min via INTRAVENOUS

## 2015-03-12 MED ORDER — LIDOCAINE HCL (CARDIAC) 20 MG/ML IV SOLN
INTRAVENOUS | Status: DC | PRN
  Administered 2015-03-12: 100 mg via INTRAVENOUS

## 2015-03-12 NOTE — Anesthesia Postprocedure Evaluation (Signed)
  Anesthesia Post-op Note  Patient: Kathryn Ware  Procedure(s) Performed: Procedure(s): place tube in throat and evaluate stomach and duodenum for source of bleeding. Cauterize if concern for rebleeding. (Left)  Anesthesia type:General  Patient location: PACU  Post pain: Pain level controlled  Post assessment: Post-op Vital signs reviewed, Patient's Cardiovascular Status Stable, Respiratory Function Stable, Patent Airway and No signs of Nausea or vomiting  Post vital signs: Reviewed and stable  Last Vitals:  Filed Vitals:   03/12/15 1230  BP:   Pulse: 84  Temp:   Resp: 26    Level of consciousness: awake, alert  and patient cooperative  Complications: No apparent anesthesia complications

## 2015-03-12 NOTE — Op Note (Signed)
[facility] Patient Name: Kathryn Ware Procedure Date: 03/12/2015 11:34 AM MRN: [id number] Account #: [id number] Date of Birth: [birth year] Admit Type: Inpatient Age: 74 Room: [facility] ENDO ROOM 4 Gender: Female Note Status: Finalized Procedure:         Upper GI endoscopy Indications:       Iron deficiency anemia secondary to chronic blood loss,                     Heme positive stool Providers:         Lupita Dawn. Candace Cruise, MD Referring MD:      Venia Carbon, MD (Referring MD) Medicines:         Monitored Anesthesia Care Procedure:         Pre-Anesthesia Assessment:                    - Prior to the procedure, a History and Physical was                     performed, and patient medications, allergies and                     sensitivities were reviewed. The patient's tolerance of                     previous anesthesia was reviewed.                    - The risks and benefits of the procedure and the sedation                     options and risks were discussed with the patient. All                     questions were answered and informed consent was obtained.                    - After reviewing the risks and benefits, the patient was                     deemed in satisfactory condition to undergo the procedure.                    After obtaining informed consent, the endoscope was passed                     under direct vision. Throughout the procedure, the                     patient's blood pressure, pulse, and oxygen saturations                     were monitored continuously. The Endoscope was introduced                     through the mouth, and advanced to the second part of                     duodenum. The upper GI endoscopy was accomplished without                     difficulty. The patient tolerated the procedure well. Findings:      The examined esophagus was normal.      The entire examined stomach  was normal.      Multiple small  angiodysplastic lesions without bleeding were found in       the first part of the duodenum. Coagulation for hemostasis using heater       probe was successful.      Multiple small angiodysplastic lesions were found in the third part of       the duodenum. Coagulation for hemostasis using heater probe was       successful.      The exam was otherwise without abnormality. Impression:        - Normal esophagus.                    - Normal stomach.                    - Multiple non-bleeding angiodysplastic lesions in the                     proximal duodenum. Treated with a heater probe.                    - Multiple angiodysplastic lesions in the distal duodenum.                     Treated with a heater probe.                    - The examination was otherwise normal.                    - No specimens collected. Recommendation:    - Observe patient's clinical course.                    - The findings and recommendations were discussed with the                     patient. Procedure Code(s): --- Professional ---                    [phone], Esophagogastroduodenoscopy, flexible, transoral;                     with control of bleeding, any method Diagnosis Code(s): --- Professional ---                    S28.315, Angiodysplasia of stomach and duodenum without                     bleeding                    D50.0, Iron deficiency anemia secondary to blood loss                     (chronic)                    R19.5, Other fecal abnormalities CPT copyright 2014 American Medical Association. All rights reserved. The codes documented in this report are preliminary and upon coder review may  be revised to meet current compliance requirements. Hulen Luster, MD 03/12/2015 12:03:30 PM This report has been signed electronically. Number of Addenda: 0 Note Initiated On: 03/12/2015 11:34 AM      [facility]

## 2015-03-12 NOTE — Plan of Care (Signed)
Problem: Discharge Progression Outcomes Goal: Other Discharge Outcomes/Goals Outcome: Progressing Plan of care progress to goals: BP/HR WDL. No signs of bleeding. No stool during the night. Denies n/v/pain. BS stable. NPO for EGD since midnight. Continue D5%NaCl0.9%.

## 2015-03-12 NOTE — Progress Notes (Signed)
[place] at [place] NAME: Kathryn Ware    MR#:  [id number]  DATE OF BIRTH:  [birth year]  SUBJECTIVE:  Some sob, fatigue. Denies any obvious bleeding.   REVIEW OF SYSTEMS:  Review of Systems  Constitutional: Negative for fever, chills and weight loss.  HENT: Negative for nosebleeds and sore throat.   Eyes: Negative for blurred vision.  Respiratory: Negative for cough, shortness of breath and wheezing.   Cardiovascular: Negative for chest pain, orthopnea, leg swelling and PND.  Gastrointestinal: Negative for heartburn, nausea, vomiting, abdominal pain, diarrhea and constipation.  Genitourinary: Negative for dysuria and urgency.  Musculoskeletal: Negative for back pain.  Skin: Negative for rash.  Neurological: Negative for dizziness, speech change, focal weakness and headaches.  Endo/Heme/Allergies: Does not bruise/bleed easily.  Psychiatric/Behavioral: Negative for depression.    DRUG ALLERGIES:   Allergies  Allergen Reactions  . Metformin     REACTION: diarrhea, patient aware that she is onmetformin when it is listed that she is allergic  . Nifedipine     REACTION: cough    VITALS:  Blood pressure 150/58, pulse 78, temperature 97.9 F (36.6 C), temperature source Oral, resp. rate 20, height 5' (1.524 m), weight 59.92 kg (132 lb 1.6 oz), SpO2 99 %.  PHYSICAL EXAMINATION:  GENERAL:  74 y.o.-year-old patient lying in the bed with no acute distress.  EYES: Pupils equal, round, reactive to light and accommodation. No scleral icterus. Extraocular muscles intact.  HEENT: Head atraumatic, normocephalic. Oropharynx and nasopharynx clear.  NECK:  Supple, no jugular venous distention. No thyroid enlargement, no tenderness.  LUNGS: Normal breath sounds bilaterally, no wheezing, rales,rhonchi or crepitation. No use of accessory muscles of respiration.  CARDIOVASCULAR: S1, S2 normal. No murmurs, rubs, or gallops.  ABDOMEN: Soft,  nontender, nondistended. Bowel sounds present. No organomegaly or mass.  EXTREMITIES: No pedal edema, cyanosis, or clubbing.  NEUROLOGIC: Cranial nerves II through XII are intact. Muscle strength 5/5 in all extremities. Sensation intact. Gait not checked.  PSYCHIATRIC: The patient is alert and oriented x 3.  SKIN: No obvious rash, lesion, or ulcer.    LABORATORY PANEL:   CBC  Recent Labs Lab 03/12/15 0940  WBC 5.6  HGB 7.9*  HCT 24.5*  PLT 197   ------------------------------------------------------------------------------------------------------------------  Chemistries   Recent Labs Lab 03/10/15 1724 03/11/15 0422  NA 140 143  K 4.2 3.6  CL 109 118*  CO2 22 22  GLUCOSE 197* 51*  BUN 19 13  CREATININE 1.18* 0.96  CALCIUM 9.0 7.3*  MG  --  1.6*  AST 18  --   ALT 9*  --   ALKPHOS 43  --   BILITOT 0.4  --    ------------------------------------------------------------------------------------------------------------------  Cardiac Enzymes  Recent Labs Lab 03/11/15 0422  TROPONINI 0.08*   ------------------------------------------------------------------------------------------------------------------  RADIOLOGY:  Dg Chest Portable 1 View  03/10/2015   CLINICAL DATA:  Acute onset of dizziness and shortness of breath. Generalized fatigue and dark tarry stools. Initial encounter.  EXAM: PORTABLE CHEST - 1 VIEW  COMPARISON:  Chest radiograph performed 01/05/2014  FINDINGS: The lungs are well-aerated and clear. There is no evidence of focal opacification, pleural effusion or pneumothorax.  The cardiomediastinal silhouette is within normal limits. No acute osseous abnormalities are seen.  IMPRESSION: No acute cardiopulmonary process seen.   Electronically Signed   By: Garald Balding M.D.   On: 03/10/2015 18:03    ASSESSMENT AND PLAN:   * Acute Posthemorrhagic anemia: Hemodynamically stable we'll  continue Protonix at this time. EGD showed multiple duodenal AVM's. All  visualized AVM's were cauterized with heater probe. Started reg diet. If she tolerates regular diet likely discharge home tomorrow  * Gastrointestinal bleed: EGD showed multiple duodenal AVM's. All visualized AVM's were cauterized with heater probe.monitor  * Hypoglycemia with h/o Diabetes mellitus. Continue patient on sliding scale insulin. Regular diet.  * Renal insufficiency, likely due to dehydration: continue IV fluids and follow kidney function in the morning.  * Elevated troponin, likely demand ischemia: no MI.  * Tobacco abuse. Discussed this patient for approximately 4 minutes. Nicotine replacement therapy will be initiated   We will get physical therapy consultation and evaluation  All the records are reviewed and case discussed with Care Management/Social Workerr. Management plans discussed with the patient, family and they are in agreement.  CODE STATUS: full code  TOTAL TIME TAKING CARE OF THIS PATIENT: 35 minutes.   POSSIBLE D/C IN 2-3 DAYS, DEPENDING ON CLINICAL CONDITION.   [place], Astoria Condon M.D on 03/12/2015 at 2:22 PM  Between 7am to 6pm - Pager - [phone]  After 6pm go to [email or web address] - password [email or web address] [place] [facility]  Office  [phone]  CC: Primary care physician; Viviana Simpler, MD

## 2015-03-12 NOTE — Progress Notes (Signed)
Inpatient Diabetes Program Recommendations  AACE/ADA: New Consensus Statement on Inpatient Glycemic Control (2013)  Target Ranges:  Prepandial:   less than 140 mg/dL      Peak postprandial:   less than 180 mg/dL (1-2 hours)      Critically ill patients:  140 - 180 mg/dL   Results for KENNEDEE, KITZMILLER (MRN [id number]) as of 03/12/2015 16:32  Ref. Range 03/11/2015 13:51 03/11/2015 16:43 03/11/2015 22:05 03/12/2015 07:16 03/12/2015 16:17  Glucose-Capillary Latest Ref Range: 65-99 mg/dL 84 145 (H) 155 (H) 131 (H) 201 (H)    Reason for Visit: Diabetes coordinator referral  Diabetes history: Type 2 Outpatient Diabetes medications: glucotrol 66m/day, Metformin 5079mday Current orders for Inpatient glycemic control: Novolog sensitive correction 0-9 units tid with meals and hs   Met with patient.  She checks blood sugars regularly through the week.  She eats 3 meals/day, sometimes substituting a snack instead of a meal. She know about the A1C test.  I warned her about the potential side effects of low blood sugars if she skips meals and strongly encouraged she eat nuts, peanut butter, cheese or eggs with crackers, bread or fruit if she skips a meal. Patient sees her MD on a regular basis.  Patient record indicates the patient is allergic to Metformin.  After discussing this with her, she is likely more intolerant vs allergic to it.  She complains that she had diarrhea when she took 2 tablets.  Patient may tolerate more Metformin in the future if it is taken AFTER the meal is eaten and her gut is full or if it is taken bid.  At this time while she is an inpatient , she is ordered Novolog correction sensitive scale tid and hs. She did not receive any correction insulin today but this afternoon's blood sugar is 20119ml- requiring 3 units.  We will continue to monitor- no further diabetes medication recommendations at this time.   JulGentry FitzN, BA, MHA, CDE Diabetes Coordinator Inpatient Diabetes  Program  [phone] Pager) [phone]/19/2016 4:42 PM

## 2015-03-12 NOTE — Transfer of Care (Signed)
Immediate Anesthesia Transfer of Care Note  Patient: Kathryn Ware  Procedure(s) Performed: Procedure(s): place tube in throat and evaluate stomach and duodenum for source of bleeding. Cauterize if concern for rebleeding. (Left)  Patient Location: Endoscopy Unit  Anesthesia Type:General  Level of Consciousness: sedated  Airway & Oxygen Therapy: Patient Spontanous Breathing and Patient connected to nasal cannula oxygen  Post-op Assessment: Report given to RN and Post -op Vital signs reviewed and stable  Post vital signs: Reviewed and stable  Last Vitals:  Filed Vitals:   03/12/15 1207  BP:   Pulse:   Temp: 35.7 C  Resp:     Complications: No apparent anesthesia complications

## 2015-03-12 NOTE — Anesthesia Preprocedure Evaluation (Addendum)
Anesthesia Evaluation  Patient identified by MRN, date of birth, ID band Patient awake    Reviewed: Allergy & Precautions, NPO status   History of Anesthesia Complications Negative for: history of anesthetic complications  Airway Mallampati: II       Dental  (+) Edentulous Upper, Edentulous Lower   Pulmonary COPDCurrent Smoker,  + rhonchi         Cardiovascular Exercise Tolerance: Poor hypertension, Pt. on medications Rhythm:Regular Rate:Normal     Neuro/Psych negative psych ROS   GI/Hepatic Neg liver ROS, PUD,   Endo/Other  diabetes, Type 2, Oral Hypoglycemic Agents  Renal/GU negative Renal ROS  negative genitourinary   Musculoskeletal negative musculoskeletal ROS (+)   Abdominal Normal abdominal exam  (+)   Peds negative pediatric ROS (+)  Hematology  (+) Blood dyscrasia, anemia ,   Anesthesia Other Findings   Reproductive/Obstetrics negative OB ROS                            Anesthesia Physical Anesthesia Plan  ASA: III  Anesthesia Plan: General   Post-op Pain Management:    Induction: Intravenous  Airway Management Planned: Nasal Cannula  Additional Equipment:   Intra-op Plan:   Post-operative Plan:   Informed Consent: I have reviewed the patients History and Physical, chart, labs and discussed the procedure including the risks, benefits and alternatives for the proposed anesthesia with the patient or authorized representative who has indicated his/her understanding and acceptance.     Plan Discussed with: CRNA and Surgeon  Anesthesia Plan Comments:         Anesthesia Quick Evaluation

## 2015-03-12 NOTE — Progress Notes (Signed)
Chaplain met with patient offered prayer and encouragement. Loralyn Freshwater D. Alroy Dust Thursday 03-12-2015   03/12/15 2000  Clinical Encounter Type  Visited With Patient  Visit Type Initial  Referral From Nurse  Consult/Referral To Chaplain  Spiritual Encounters  Spiritual Needs Prayer;Emotional  Stress Factors  Patient Stress Factors Health changes  Family Stress Factors Health changes

## 2015-03-12 NOTE — Plan of Care (Signed)
Problem: Discharge Progression Outcomes Goal: Other Discharge Outcomes/Goals Outcome: Progressing Plan of care progress to goals:   -VS WDL.   -Denies pain.   -Negative guaiac stools.   -HGB 7.9. Stable. No s/s of active bleeding. No N/V/D. D5%NaCl0.9% infusing at 76m/hr. Protonix gtt infusing at 232mhr.   -Ambulates with one person assist.   -NPO for EGD. Tolerated procedure well. Resumed healthy heart diet.

## 2015-03-12 NOTE — Consult Note (Signed)
  No active bleeding. EGD showed multiple duodenal AVM's. All visualized AVM's were cauterized with heater probe. Ordered reg diet. Moniter hgb. Will sign off. thanks

## 2015-03-13 LAB — CBC
HCT: 26.1 % — ABNORMAL LOW (ref 35.0–47.0)
HEMOGLOBIN: 8.4 g/dL — AB (ref 12.0–16.0)
MCH: 28.3 pg (ref 26.0–34.0)
MCHC: 32 g/dL (ref 32.0–36.0)
MCV: 88.3 fL (ref 80.0–100.0)
Platelets: 223 10*3/uL (ref 150–440)
RBC: 2.95 MIL/uL — ABNORMAL LOW (ref 3.80–5.20)
RDW: 16.6 % — AB (ref 11.5–14.5)
WBC: 6.6 10*3/uL (ref 3.6–11.0)

## 2015-03-13 LAB — GLUCOSE, CAPILLARY: Glucose-Capillary: 141 mg/dL — ABNORMAL HIGH (ref 65–99)

## 2015-03-13 MED ORDER — OMEPRAZOLE 40 MG PO CPDR: 40.0000 mg | DELAYED_RELEASE_CAPSULE | Freq: Two times a day (BID) | ORAL | Status: DC

## 2015-03-13 NOTE — Discharge Instructions (Signed)
Anemia, Nonspecific Anemia is a condition in which the concentration of red blood cells or hemoglobin in the blood is below normal. Hemoglobin is a substance in red blood cells that carries oxygen to the tissues of the body. Anemia results in not enough oxygen reaching these tissues.  CAUSES  Common causes of anemia include:   Excessive bleeding. Bleeding may be internal or external. This includes excessive bleeding from periods (in women) or from the intestine.   Poor nutrition.   Chronic kidney, thyroid, and liver disease.  Bone marrow disorders that decrease red blood cell production.  Cancer and treatments for cancer.  HIV, AIDS, and their treatments.  Spleen problems that increase red blood cell destruction.  Blood disorders.  Excess destruction of red blood cells due to infection, medicines, and autoimmune disorders. SIGNS AND SYMPTOMS   Minor weakness.   Dizziness.   Headache.  Palpitations.   Shortness of breath, especially with exercise.   Paleness.  Cold sensitivity.  Indigestion.  Nausea.  Difficulty sleeping.  Difficulty concentrating. Symptoms may occur suddenly or they may develop slowly.  DIAGNOSIS  Additional blood tests are often needed. These help your health care provider determine the best treatment. Your health care provider will check your stool for blood and look for other causes of blood loss.  TREATMENT  Treatment varies depending on the cause of the anemia. Treatment can include:   Supplements of iron, vitamin B12, or folic acid.   Hormone medicines.   A blood transfusion. This may be needed if blood loss is severe.   Hospitalization. This may be needed if there is significant continual blood loss.   Dietary changes.  Spleen removal. HOME CARE INSTRUCTIONS Keep all follow-up appointments. It often takes many weeks to correct anemia, and having your health care provider check on your condition and your response to  treatment is very important. SEEK IMMEDIATE MEDICAL CARE IF:   You develop extreme weakness, shortness of breath, or chest pain.   You become dizzy or have trouble concentrating.  You develop heavy vaginal bleeding.   You develop a rash.   You have bloody or black, tarry stools.   You faint.   You vomit up blood.   You vomit repeatedly.   You have abdominal pain.  You have a fever or persistent symptoms for more than 2-3 days.   You have a fever and your symptoms suddenly get worse.   You are dehydrated.  MAKE SURE YOU:  Understand these instructions.  Will watch your condition.  Will get help right away if you are not doing well or get worse. Document Released: 11/17/2004 Document Revised: 06/12/2013 Document Reviewed: 04/05/2013 Exit[employer] Patient Information 2015 [employer]. This information is not intended to replace advice given to you by your health care provider. Make sure you discuss any questions you have with your health care provider.  

## 2015-03-13 NOTE — Evaluation (Signed)
Physical Therapy Evaluation Patient Details Name: Kathryn Ware MRN: [id number] DOB: Jun 28, 1941 Today's Date: 03/13/2015   History of Present Illness   Pt with anemia, now back up to 8.4 at this time.   Clinical Impression  Pt does well with PT showing no functional limitations and pt reporting that she feels completely at her baseline.  She was able to negotiate up/down stairs (has 2 steps at home) with no issue and walks with good speed and confidence. Pt with no PT needs at this time.    Follow Up Recommendations No PT follow up    Equipment Recommendations       Recommendations for Other Services       Precautions / Restrictions Precautions Precautions:  (mod fall risk) Restrictions Weight Bearing Restrictions: No      Mobility  Bed Mobility Overal bed mobility: Independent                Transfers Overall transfer level: Independent                  Ambulation/Gait Ambulation/Gait assistance: Independent Ambulation Distance (Feet): 250 Feet Assistive device: None     Gait velocity interpretation: >2.62 ft/sec, indicative of independent community ambulator General Gait Details: Pt with baseline ambulation with no loses of balance.  She shows good confidence and speed and ultimately has no safety concerns and very little fatigue.   Stairs            Wheelchair Mobility    Modified Rankin (Stroke Patients Only)       Balance Overall balance assessment: Independent                                           Pertinent Vitals/Pain Pain Assessment: No/denies pain    Home Living Family/patient expects to be discharged to:: Private residence   Available Help at Discharge:  (sister)                  Prior Function Level of Independence: Independent               Hand Dominance        Extremity/Trunk Assessment   Upper Extremity Assessment: Overall WFL for tasks assessed           Lower  Extremity Assessment: Overall WFL for tasks assessed         Communication   Communication: No difficulties  Cognition Arousal/Alertness: Awake/alert Behavior During Therapy: WFL for tasks assessed/performed Overall Cognitive Status: Within Functional Limits for tasks assessed                      General Comments      Exercises        Assessment/Plan    PT Assessment Patent does not need any further PT services  PT Diagnosis     PT Problem List    PT Treatment Interventions     PT Goals (Current goals can be found in the Care Plan section)      Frequency     Barriers to discharge        Co-evaluation               End of Session   Activity Tolerance: Patient tolerated treatment well Patient left: in chair           Time: 6283-1517 PT  Time Calculation (min) (ACUTE ONLY): 12 min   Charges:   PT Evaluation $Initial PT Evaluation Tier I: 1 Procedure     PT G Codes:       [name], PT, DPT [phone]  [name] 03/13/2015, 10:33 AM

## 2015-03-13 NOTE — Plan of Care (Signed)
Problem: Discharge Progression Outcomes Goal: Other Discharge Outcomes/Goals Outcome: Progressing Plan of care progress to goals: VSS. Denies pain. No signs of bleeding.Tolerating diet. Anticipating discharge today.

## 2015-03-13 NOTE — Plan of Care (Signed)
MD making rounds. Discharge orders received. IV discontinued. Discharge orders explained, signed and witnessed. No unanswered questions. Discharged via wheelchair with auxiliary staff. Belongings sent with patient.

## 2015-03-13 NOTE — Progress Notes (Signed)
Entered in error

## 2015-03-16 NOTE — Discharge Summary (Signed)
[facility] NAME: Kathryn Ware    MR#:  [id number]  DATE OF BIRTH:  [birth year]  DATE OF ADMISSION:  03/10/2015 ADMITTING PHYSICIAN: Theodoro Grist, MD  DATE OF DISCHARGE: 03/13/2015 10:11 AM  PRIMARY CARE PHYSICIAN: Viviana Simpler, MD    ADMISSION DIAGNOSIS:  Acute blood loss anemia [D62] Gastrointestinal hemorrhage with melena [K92.1]  DISCHARGE DIAGNOSIS:  Principal Problem:   Acute posthemorrhagic anemia Active Problems:   anemia   SECONDARY DIAGNOSIS:   Past Medical History  Diagnosis Date  . Diabetes mellitus type II   . HTN (hypertension)   . HLD (hyperlipidemia)   . RLS (restless legs syndrome)   . GI AVM (gastrointestinal arteriovenous vascular malformation)     stomach  . History of echocardiogram 3/15    a. 12/2013: EF 60-65%, DD, mild LVH, nl RV size & systolic function, mild MR/TR, mild AS, nl RVSP  . History of nuclear stress test     a. 12/2013: low risk, no sig WMA, nondiag EKG 2/2 baseline LVH w/ repol abnl, no sig ischemia, EF 70% no artifact    HOSPITAL COURSE:  Kathryn Ware is a 74 y.o. female with a known history of diabetes, hypertension, hyperlipidemia, gastroesophageal reflux disease who had EGD in 2007 by Dr. Gustavo Lah revealing multiple gastric as well as duodenal angiectasias into the hospital to emergency room was complains of feeling dizzy, lightheaded and was found to have acute blood loss anemia. Please see Dr Keenan [facility] for furthur details. GI consultation was obtained with Dr Candace Cruise who recommended EGD with results as below. Cardio c/s was obtained with Dr Rockey Situ for elevated troponins which were thought to be due to supply demand ischemia. Post EGD she remained hemodynamically stable.  * Acute Posthemorrhagic anemia: EGD showed multiple duodenal AVM's. All visualized AVM's were cauterized with heater probe. tolerated reg diet.   * Gastrointestinal bleed: EGD showed multiple duodenal  AVM's. All visualized AVM's were cauterized with heater probe.  * Hypoglycemia with h/o Diabetes mellitus. Continue patient on sliding scale insulin. Regular diet.  * Renal insufficiency, likely due to dehydration: continue IV fluids and follow kidney function in the morning.  * Elevated troponin, likely demand ischemia: no MI  She tolerated regular diet and was discharged home in stable condition.  CONSULTS OBTAINED:   GI - Dr Verdie Shire Cardio - Dr Ida Rogue  DRUG ALLERGIES:   Allergies  Allergen Reactions  . Metformin     REACTION: diarrhea, patient aware that she is onmetformin when it is listed that she is allergic  . Nifedipine     REACTION: cough    DISCHARGE MEDICATIONS:   Discharge Medication List as of 03/13/2015  9:00 AM    CONTINUE these medications which have CHANGED   Details  omeprazole (PRILOSEC) 40 MG capsule Take 1 capsule (40 mg total) by mouth 2 (two) times daily before a meal., Starting 03/13/2015, Until Discontinued, Normal      CONTINUE these medications which have NOT CHANGED   Details  ferrous sulfate 325 (65 FE) MG tablet Take 325 mg by mouth daily with breakfast.  , Until Discontinued, Historical Med    glipiZIDE (GLUCOTROL XL) 10 MG 24 hr tablet TAKE 1 TABLET BY MOUTH TWICE DAILY, Normal    lisinopril (PRINIVIL,ZESTRIL) 40 MG tablet Take 40 mg by mouth daily., Until Discontinued, Historical Med    lisinopril-hydrochlorothiazide (PRINZIDE,ZESTORETIC) 20-12.5 MG per tablet Take 2 tablets by mouth daily., Starting 02/18/2015,  Until Discontinued, Normal    metFORMIN (GLUCOPHAGE-XR) 500 MG 24 hr tablet TAKE 1 TABLET BY MOUTH TWICE DAILY, Normal    simvastatin (ZOCOR) 80 MG tablet TAKE 1 TABLET BY MOUTH EVERY DAY., Normal    sucralfate (CARAFATE) 1 G tablet TAKE 1 TABLET BY MOUTH BEFORE MEALS AND EVERY NIGHT AT BEDTIME, Normal    vitamin B-12 (CYANOCOBALAMIN) 1000 MCG tablet Take 1,000 mcg by mouth daily., Until Discontinued, Historical Med     ONE TOUCH ULTRA TEST test strip AS DIRECTED TO TEST BLOOD SUGAR EVERY DAY., Normal         DIET:  Regular diet  DISCHARGE CONDITION:  Good  ACTIVITY:  Activity as tolerated and Activity as tolerated and no driving for today  OXYGEN:  Home Oxygen: No.   Oxygen Delivery: room air  DISCHARGE LOCATION:  home   If you experience worsening of your admission symptoms, develop shortness of breath, life threatening emergency, suicidal or homicidal thoughts you must seek medical attention immediately by calling 911 or calling your MD immediately  if symptoms less severe.  You Must read complete instructions/literature along with all the possible adverse reactions/side effects for all the Medicines you take and that have been prescribed to you. Take any new Medicines after you have completely understood and accpet all the possible adverse reactions/side effects.   Please note  You were cared for by a hospitalist during your hospital stay. If you have any questions about your discharge medications or the care you received while you were in the hospital after you are discharged, you can call the unit and asked to speak with the hospitalist on call if the hospitalist that took care of you is not available. Once you are discharged, your primary care physician will handle any further medical issues. Please note that NO REFILLS for any discharge medications will be authorized once you are discharged, as it is imperative that you return to your primary care physician (or establish a relationship with a primary care physician if you do not have one) for your aftercare needs so that they can reassess your need for medications and monitor your lab values.     On the date of Discharge:   VITAL SIGNS:  Blood pressure 138/68, pulse 82, temperature 98.2 F (36.8 C), temperature source Oral, resp. rate 18, height 5' (1.524 m), weight 61.689 kg (136 lb), SpO2 99 %.  PHYSICAL EXAMINATION:  GENERAL:   74 y.o.-year-old patient lying in the bed with no acute distress.  EYES: Pupils equal, round, reactive to light and accommodation. No scleral icterus. Extraocular muscles intact.  HEENT: Head atraumatic, normocephalic. Oropharynx and nasopharynx clear.  NECK:  Supple, no jugular venous distention. No thyroid enlargement, no tenderness.  LUNGS: Normal breath sounds bilaterally, no wheezing, rales,rhonchi or crepitation. No use of accessory muscles of respiration.  CARDIOVASCULAR: S1, S2 normal. No murmurs, rubs, or gallops.  ABDOMEN: Soft, non-tender, non-distended. Bowel sounds present. No organomegaly or mass.  EXTREMITIES: No pedal edema, cyanosis, or clubbing.  NEUROLOGIC: Cranial nerves II through XII are intact. Muscle strength 5/5 in all extremities. Sensation intact. Gait not checked.  PSYCHIATRIC: The patient is alert and oriented x 3.  SKIN: No obvious rash, lesion, or ulcer.   DATA REVIEW:   CBC  Recent Labs Lab 03/13/15 0523  WBC 6.6  HGB 8.4*  HCT 26.1*  PLT 223    Chemistries   Recent Labs Lab 03/10/15 1724 03/11/15 0422  NA 140 143  K 4.2 3.6  CL 109 118*  CO2 22 22  GLUCOSE 197* 51*  BUN 19 13  CREATININE 1.18* 0.96  CALCIUM 9.0 7.3*  MG  --  1.6*  AST 18  --   ALT 9*  --   ALKPHOS 43  --   BILITOT 0.4  --     Cardiac Enzymes  Recent Labs Lab 03/11/15 0422  TROPONINI 0.08*    Management plans discussed with the patient, family and they are in agreement.  CODE STATUS: Full Code  TOTAL TIME TAKING CARE OF THIS PATIENT: 45 minutes.    [facility], Alonte Wulff M.D on 03/16/2015 at 10:47 AM  Between 7am to 6pm - Pager - [phone]  After 6pm go to [email or web address] - password [email or web address] [facility]  Office  [phone]  CC: Primary care physician; Viviana Simpler, MD  Dr Ida Rogue, MD Dr Verdie Shire, MD

## 2015-03-17 ENCOUNTER — Other Ambulatory Visit: Payer: Self-pay | Admitting: Internal Medicine

## 2015-03-17 ENCOUNTER — Encounter: Payer: Self-pay | Admitting: Gastroenterology

## 2015-03-17 DIAGNOSIS — I10 Essential (primary) hypertension: Secondary | ICD-10-CM

## 2015-03-17 NOTE — Addendum Note (Signed)
Addended by: Royann Shivers A on: 03/17/2015 03:06 PM   Modules accepted: Orders

## 2015-03-19 ENCOUNTER — Encounter: Payer: Self-pay | Admitting: Internal Medicine

## 2015-03-19 ENCOUNTER — Ambulatory Visit (INDEPENDENT_AMBULATORY_CARE_PROVIDER_SITE_OTHER): Payer: Medicare Other | Admitting: Internal Medicine

## 2015-03-19 VITALS — BP 138/68 | HR 94 | Temp 98.1°F | Wt 126.8 lb

## 2015-03-19 DIAGNOSIS — K31819 Angiodysplasia of stomach and duodenum without bleeding: Secondary | ICD-10-CM | POA: Insufficient documentation

## 2015-03-19 DIAGNOSIS — Q2733 Arteriovenous malformation of digestive system vessel: Secondary | ICD-10-CM

## 2015-03-19 DIAGNOSIS — N179 Acute kidney failure, unspecified: Secondary | ICD-10-CM | POA: Diagnosis not present

## 2015-03-19 NOTE — Assessment & Plan Note (Signed)
Recent hospitalization avms cauterized by Dr Candace Cruise at EGD On iron Will recheck labs

## 2015-03-19 NOTE — Progress Notes (Signed)
Subjective:    Patient ID: Kathryn Ware, female    DOB: 21-Mar-1941, 74 y.o.   MRN: [id number]  HPI Here for hospital follow up Reviewed hospital records  Feels okay Not really weak but some lightheadedness---even just sitting No syncope  No chest pain No SOB No abdominal pain Bowels are dark--but her normal  Drinking okay Urinating normally  Current Outpatient Prescriptions on File Prior to Visit  Medication Sig Dispense Refill  . ferrous sulfate 325 (65 FE) MG tablet Take 325 mg by mouth daily with breakfast.      . glipiZIDE (GLUCOTROL XL) 10 MG 24 hr tablet TAKE 1 TABLET BY MOUTH TWICE DAILY 60 tablet 11  . lisinopril-hydrochlorothiazide (PRINZIDE,ZESTORETIC) 20-12.5 MG per tablet Take 2 tablets by mouth daily. 60 tablet 11  . metFORMIN (GLUCOPHAGE-XR) 500 MG 24 hr tablet TAKE 1 TABLET BY MOUTH TWICE DAILY 60 tablet 11  . omeprazole (PRILOSEC) 40 MG capsule Take 1 capsule (40 mg total) by mouth 2 (two) times daily before a meal. 60 capsule 1  . ONE TOUCH ULTRA TEST test strip AS DIRECTED TO TEST BLOOD SUGAR EVERY DAY. 100 each 1  . simvastatin (ZOCOR) 80 MG tablet TAKE 1 TABLET BY MOUTH EVERY DAY. 90 tablet 1  . sucralfate (CARAFATE) 1 G tablet TAKE 1 TABLET BY MOUTH BEFORE MEALS AND EVERY NIGHT AT BEDTIME 120 tablet 0  . vitamin B-12 (CYANOCOBALAMIN) 1000 MCG tablet Take 1,000 mcg by mouth daily.     No current facility-administered medications on file prior to visit.    Allergies  Allergen Reactions  . Metformin     REACTION: diarrhea, patient aware that she is onmetformin when it is listed that she is allergic  . Nifedipine     REACTION: cough    Past Medical History  Diagnosis Date  . Diabetes mellitus type II   . HTN (hypertension)   . HLD (hyperlipidemia)   . RLS (restless legs syndrome)   . GI AVM (gastrointestinal arteriovenous vascular malformation)     stomach  . History of echocardiogram 3/15    a. 12/2013: EF 60-65%, DD, mild LVH, nl RV size &  systolic function, mild MR/TR, mild AS, nl RVSP  . History of nuclear stress test     a. 12/2013: low risk, no sig WMA, nondiag EKG 2/2 baseline LVH w/ repol abnl, no sig ischemia, EF 70% no artifact    Past Surgical History  Procedure Laterality Date  . Abdominal hysterectomy  1976    RSO (fibroid)  . Top    . Abdominal adhesion surgery  11/2001    Dr. Tamala Julian  . Esophagogastroduodenoscopy  07/2006    multiple angioectasias  . Doppler echocardiography      EF 55%, mild MR, TR  . Dobutamine stress echo  11/10    normal  . Laryngeal polyp  10/2008    Dr. Tami Ribas  . Esophagogastroduodenoscopy Left 03/12/2015    Procedure: place tube in throat and evaluate stomach and duodenum for source of bleeding. Cauterize if concern for rebleeding.;  Surgeon: Hulen Luster, MD;  [facility] ENDOSCOPY;  Service: Endoscopy;  Laterality: Left;    Family History  Problem Relation Age of Onset  . Stroke Father   . Hypertension Mother   . Hypertension      sibling  . Diabetes      grandmother  . Cancer Sister     breast cancer    History   Social History  . Marital Status:  Widowed    Spouse Name: N/A  . Number of Children: 1  . Years of Education: N/A   Occupational History  . 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 Smoking status: Current Every Day Smoker -- 0.50 packs/day  . Smokeless tobacco: Never Used  . Alcohol Use: No  . Drug Use: No  . Sexual Activity: Not on file   Other Topics Concern  . Not on file   Social History Narrative   No living will   Requests son Orson Gear as health care POA   Would accept resuscitation    Not sure about feeding tubes   Review of Systems  Appetite is okay Not really sleeping that great---nothing new     Objective:   Physical Exam  Constitutional: She appears well-developed and well-nourished. No distress.  Neck: Normal range of motion. Neck supple. No thyromegaly  present.  Cardiovascular: Normal rate, regular rhythm and normal heart sounds.  Exam reveals no gallop.   No murmur heard. Pulmonary/Chest: Effort normal. No respiratory distress. She has no wheezes. She has no rales.  Abdominal: Soft. Bowel sounds are normal. She exhibits no distension. There is no tenderness. There is no rebound and no guarding.  Musculoskeletal: She exhibits no edema or tenderness.  Lymphadenopathy:    She has no cervical adenopathy.  Psychiatric: She has a normal mood and affect. Her behavior is normal.          Assessment & Plan:

## 2015-03-19 NOTE — Assessment & Plan Note (Signed)
Probably prerenal  Will recheck now

## 2015-03-19 NOTE — Progress Notes (Signed)
Pre visit review using our clinic review tool, if applicable. No additional management support is needed unless otherwise documented below in the visit note. 

## 2015-03-20 ENCOUNTER — Other Ambulatory Visit: Payer: Medicare Other

## 2015-03-20 LAB — CBC WITH DIFFERENTIAL/PLATELET
Basophils Absolute: 0 10*3/uL (ref 0.0–0.1)
Basophils Relative: 0.3 % (ref 0.0–3.0)
Eosinophils Absolute: 0 10*3/uL (ref 0.0–0.7)
Eosinophils Relative: 0.8 % (ref 0.0–5.0)
HEMATOCRIT: 30.5 % — AB (ref 36.0–46.0)
Hemoglobin: 9.9 g/dL — ABNORMAL LOW (ref 12.0–15.0)
LYMPHS PCT: 17.7 % (ref 12.0–46.0)
Lymphs Abs: 1.1 10*3/uL (ref 0.7–4.0)
MCHC: 32.4 g/dL (ref 30.0–36.0)
MCV: 86.3 fl (ref 78.0–100.0)
MONO ABS: 0.5 10*3/uL (ref 0.1–1.0)
Monocytes Relative: 7.7 % (ref 3.0–12.0)
NEUTROS ABS: 4.5 10*3/uL (ref 1.4–7.7)
NEUTROS PCT: 73.5 % (ref 43.0–77.0)
Platelets: 371 10*3/uL (ref 150.0–400.0)
RBC: 3.53 Mil/uL — AB (ref 3.87–5.11)
RDW: 16 % — ABNORMAL HIGH (ref 11.5–15.5)
WBC: 6.2 10*3/uL (ref 4.0–10.5)

## 2015-03-20 LAB — RENAL FUNCTION PANEL
ALBUMIN: 4.3 g/dL (ref 3.5–5.2)
BUN: 12 mg/dL (ref 6–23)
CO2: 27 meq/L (ref 19–32)
Calcium: 9.7 mg/dL (ref 8.4–10.5)
Chloride: 102 mEq/L (ref 96–112)
Creatinine, Ser: 1.05 mg/dL (ref 0.40–1.20)
GFR: 66.05 mL/min (ref >60.00)
Glucose, Bld: 134 mg/dL — ABNORMAL HIGH (ref 70–99)
POTASSIUM: 3.9 meq/L (ref 3.5–5.1)
Phosphorus: 3 mg/dL (ref 2.3–4.6)
SODIUM: 136 meq/L (ref 135–145)

## 2015-03-27 ENCOUNTER — Encounter: Payer: Self-pay | Admitting: *Deleted

## 2015-04-07 ENCOUNTER — Telehealth: Payer: Self-pay

## 2015-04-07 NOTE — Telephone Encounter (Signed)
Pt left v/m wanting to know if should take omeprazole 40 mg prescribed by Dr Manuella Ghazi or should pt take the omeprazole 20 mg given to pt by Dr Silvio Pate. Pt has not started the 40 mg yet until spoke with Dr Silvio Pate. Pt said omeprazole 20 mg taking one daily works well. Pt request cb.

## 2015-04-07 NOTE — Telephone Encounter (Signed)
Given her recent bleeding--she should increase to '40mg'$  daily for now

## 2015-04-07 NOTE — Telephone Encounter (Signed)
Spoke with patient and advised results   

## 2015-05-07 DIAGNOSIS — D500 Iron deficiency anemia secondary to blood loss (chronic): Secondary | ICD-10-CM | POA: Diagnosis not present

## 2015-05-18 ENCOUNTER — Other Ambulatory Visit: Payer: Self-pay | Admitting: Internal Medicine

## 2015-08-04 ENCOUNTER — Other Ambulatory Visit: Payer: Self-pay | Admitting: Internal Medicine

## 2015-08-06 ENCOUNTER — Other Ambulatory Visit: Payer: Self-pay | Admitting: *Deleted

## 2015-08-06 MED ORDER — OMEPRAZOLE 40 MG PO CPDR: 40.0000 mg | DELAYED_RELEASE_CAPSULE | Freq: Two times a day (BID) | ORAL | Status: DC

## 2015-08-06 NOTE — Telephone Encounter (Signed)
Patient request refill on omeprazole 40 mg bid to [facility] in [place].  This was prescribed while the patient was inpatient on 03/13/15 by Dr. Manuella Ghazi.  Okay to refill?

## 2015-08-06 NOTE — Telephone Encounter (Signed)
Approved: #[phone] x 3

## 2015-08-30 ENCOUNTER — Other Ambulatory Visit: Payer: Self-pay | Admitting: Internal Medicine

## 2015-10-08 DIAGNOSIS — R739 Hyperglycemia, unspecified: Secondary | ICD-10-CM | POA: Diagnosis not present

## 2015-10-09 ENCOUNTER — Other Ambulatory Visit: Payer: Self-pay | Admitting: Internal Medicine

## 2015-10-13 ENCOUNTER — Observation Stay
Admission: EM | Admit: 2015-10-13 | Discharge: 2015-10-14 | Disposition: A | Discharge status: Home / Self Care | Payer: Medicare Other | Attending: Specialist | Admitting: Specialist

## 2015-10-13 DIAGNOSIS — N179 Acute kidney failure, unspecified: Secondary | ICD-10-CM | POA: Diagnosis not present

## 2015-10-13 DIAGNOSIS — D649 Anemia, unspecified: Secondary | ICD-10-CM | POA: Diagnosis not present

## 2015-10-13 DIAGNOSIS — K219 Gastro-esophageal reflux disease without esophagitis: Secondary | ICD-10-CM | POA: Insufficient documentation

## 2015-10-13 DIAGNOSIS — R531 Weakness: Secondary | ICD-10-CM | POA: Diagnosis not present

## 2015-10-13 DIAGNOSIS — E119 Type 2 diabetes mellitus without complications: Secondary | ICD-10-CM | POA: Insufficient documentation

## 2015-10-13 DIAGNOSIS — Z8249 Family history of ischemic heart disease and other diseases of the circulatory system: Secondary | ICD-10-CM | POA: Diagnosis not present

## 2015-10-13 DIAGNOSIS — Z90710 Acquired absence of both cervix and uterus: Secondary | ICD-10-CM | POA: Insufficient documentation

## 2015-10-13 DIAGNOSIS — Z7984 Long term (current) use of oral hypoglycemic drugs: Secondary | ICD-10-CM | POA: Diagnosis not present

## 2015-10-13 DIAGNOSIS — E7800 Pure hypercholesterolemia, unspecified: Secondary | ICD-10-CM | POA: Insufficient documentation

## 2015-10-13 DIAGNOSIS — Q2733 Arteriovenous malformation of digestive system vessel: Secondary | ICD-10-CM | POA: Insufficient documentation

## 2015-10-13 DIAGNOSIS — I10 Essential (primary) hypertension: Secondary | ICD-10-CM | POA: Insufficient documentation

## 2015-10-13 DIAGNOSIS — D62 Acute posthemorrhagic anemia: Secondary | ICD-10-CM | POA: Diagnosis not present

## 2015-10-13 DIAGNOSIS — Z833 Family history of diabetes mellitus: Secondary | ICD-10-CM | POA: Insufficient documentation

## 2015-10-13 DIAGNOSIS — E785 Hyperlipidemia, unspecified: Secondary | ICD-10-CM | POA: Insufficient documentation

## 2015-10-13 DIAGNOSIS — G2581 Restless legs syndrome: Secondary | ICD-10-CM | POA: Insufficient documentation

## 2015-10-13 DIAGNOSIS — R5383 Other fatigue: Secondary | ICD-10-CM | POA: Diagnosis not present

## 2015-10-13 DIAGNOSIS — R0602 Shortness of breath: Secondary | ICD-10-CM | POA: Insufficient documentation

## 2015-10-13 DIAGNOSIS — Z823 Family history of stroke: Secondary | ICD-10-CM | POA: Insufficient documentation

## 2015-10-13 DIAGNOSIS — Z803 Family history of malignant neoplasm of breast: Secondary | ICD-10-CM | POA: Diagnosis not present

## 2015-10-13 DIAGNOSIS — M6281 Muscle weakness (generalized): Secondary | ICD-10-CM | POA: Diagnosis not present

## 2015-10-13 DIAGNOSIS — K922 Gastrointestinal hemorrhage, unspecified: Secondary | ICD-10-CM | POA: Diagnosis not present

## 2015-10-13 DIAGNOSIS — Z720 Tobacco use: Secondary | ICD-10-CM | POA: Diagnosis not present

## 2015-10-13 DIAGNOSIS — F17200 Nicotine dependence, unspecified, uncomplicated: Secondary | ICD-10-CM | POA: Diagnosis not present

## 2015-10-13 DIAGNOSIS — F419 Anxiety disorder, unspecified: Secondary | ICD-10-CM | POA: Diagnosis not present

## 2015-10-13 DIAGNOSIS — R42 Dizziness and giddiness: Secondary | ICD-10-CM | POA: Diagnosis not present

## 2015-10-13 LAB — URINALYSIS COMPLETE WITH MICROSCOPIC (ARMC ONLY)
BACTERIA UA: NONE SEEN
Bilirubin Urine: NEGATIVE
GLUCOSE, UA: NEGATIVE mg/dL
HGB URINE DIPSTICK: NEGATIVE
Ketones, ur: NEGATIVE mg/dL
LEUKOCYTES UA: NEGATIVE
NITRITE: NEGATIVE
PH: 7 (ref 5.0–8.0)
PROTEIN: NEGATIVE mg/dL
RBC / HPF: NONE SEEN RBC/hpf (ref 0–5)
SPECIFIC GRAVITY, URINE: 1.005 (ref 1.005–1.030)

## 2015-10-13 LAB — BASIC METABOLIC PANEL
ANION GAP: 6 (ref 5–15)
BUN: 20 mg/dL (ref 6–20)
CHLORIDE: 110 mmol/L (ref 101–111)
CO2: 21 mmol/L — ABNORMAL LOW (ref 22–32)
Calcium: 9.1 mg/dL (ref 8.9–10.3)
Creatinine, Ser: 1.23 mg/dL — ABNORMAL HIGH (ref 0.44–1.00)
GFR calc Af Amer: 49 mL/min — ABNORMAL LOW (ref >60)
GFR, EST NON AFRICAN AMERICAN: 42 mL/min — AB (ref >60)
Glucose, Bld: 177 mg/dL — ABNORMAL HIGH (ref 65–99)
POTASSIUM: 3.9 mmol/L (ref 3.5–5.1)
SODIUM: 137 mmol/L (ref 135–145)

## 2015-10-13 LAB — CBC
HEMATOCRIT: 20.6 % — AB (ref 35.0–47.0)
HEMOGLOBIN: 6.4 g/dL — AB (ref 12.0–16.0)
MCH: 26.4 pg (ref 26.0–34.0)
MCHC: 30.9 g/dL — ABNORMAL LOW (ref 32.0–36.0)
MCV: 85.6 fL (ref 80.0–100.0)
Platelets: 375 10*3/uL (ref 150–440)
RBC: 2.41 MIL/uL — AB (ref 3.80–5.20)
RDW: 15.9 % — ABNORMAL HIGH (ref 11.5–14.5)
WBC: 7.2 10*3/uL (ref 3.6–11.0)

## 2015-10-13 LAB — PREPARE RBC (CROSSMATCH)

## 2015-10-13 LAB — GLUCOSE, CAPILLARY: GLUCOSE-CAPILLARY: 159 mg/dL — AB (ref 65–99)

## 2015-10-13 MED ORDER — SODIUM CHLORIDE 0.9 % IV BOLUS (SEPSIS)
1000.0000 mL | Freq: Once | INTRAVENOUS | Status: AC
  Administered 2015-10-13: 1000 mL via INTRAVENOUS

## 2015-10-13 MED ORDER — NICOTINE 21 MG/24HR TD PT24
21.0000 mg | MEDICATED_PATCH | Freq: Every day | TRANSDERMAL | Status: DC
  Administered 2015-10-13 – 2015-10-14 (×2): 21 mg via TRANSDERMAL
  Filled 2015-10-13 (×2): qty 1

## 2015-10-13 MED ORDER — PANTOPRAZOLE SODIUM 40 MG IV SOLR
40.0000 mg | Freq: Once | INTRAVENOUS | Status: AC
  Administered 2015-10-13: 40 mg via INTRAVENOUS
  Filled 2015-10-13: qty 40

## 2015-10-13 MED ORDER — ONDANSETRON HCL 4 MG/2ML IJ SOLN: 4.0000 mg | Freq: Four times a day (QID) | INTRAMUSCULAR | Status: DC | PRN

## 2015-10-13 MED ORDER — HYDROCHLOROTHIAZIDE 25 MG PO TABS
25.0000 mg | ORAL_TABLET | Freq: Every day | ORAL | Status: DC
  Administered 2015-10-14: 25 mg via ORAL
  Filled 2015-10-13: qty 1

## 2015-10-13 MED ORDER — FERROUS SULFATE 325 (65 FE) MG PO TABS
325.0000 mg | ORAL_TABLET | Freq: Every day | ORAL | Status: DC
  Administered 2015-10-14: 08:00:00 325 mg via ORAL
  Filled 2015-10-13: qty 1

## 2015-10-13 MED ORDER — SUCRALFATE 1 G PO TABS
1.0000 g | ORAL_TABLET | Freq: Three times a day (TID) | ORAL | Status: DC
  Administered 2015-10-13 – 2015-10-14 (×3): 1 g via ORAL
  Filled 2015-10-13 (×6): qty 1

## 2015-10-13 MED ORDER — LISINOPRIL-HYDROCHLOROTHIAZIDE 20-12.5 MG PO TABS: 2.0000 | ORAL_TABLET | Freq: Every day | ORAL | Status: DC

## 2015-10-13 MED ORDER — ATORVASTATIN CALCIUM 20 MG PO TABS: 40.0000 mg | ORAL_TABLET | Freq: Every day | ORAL | Status: DC

## 2015-10-13 MED ORDER — METFORMIN HCL ER 500 MG PO TB24
500.0000 mg | ORAL_TABLET | Freq: Two times a day (BID) | ORAL | Status: DC
  Administered 2015-10-14: 500 mg via ORAL
  Filled 2015-10-13: qty 1

## 2015-10-13 MED ORDER — CLONAZEPAM 0.5 MG PO TABS
0.5000 mg | ORAL_TABLET | Freq: Every day | ORAL | Status: DC
  Administered 2015-10-13: 23:00:00 0.5 mg via ORAL
  Filled 2015-10-13: qty 1

## 2015-10-13 MED ORDER — LISINOPRIL 20 MG PO TABS
40.0000 mg | ORAL_TABLET | Freq: Every day | ORAL | Status: DC
  Administered 2015-10-14: 11:00:00 40 mg via ORAL
  Filled 2015-10-13: qty 2

## 2015-10-13 MED ORDER — GLIPIZIDE ER 10 MG PO TB24
10.0000 mg | ORAL_TABLET | Freq: Two times a day (BID) | ORAL | Status: DC
  Administered 2015-10-14: 11:00:00 10 mg via ORAL
  Filled 2015-10-13: qty 1

## 2015-10-13 MED ORDER — PANTOPRAZOLE SODIUM 40 MG IV SOLR
40.0000 mg | Freq: Two times a day (BID) | INTRAVENOUS | Status: DC
  Administered 2015-10-13 – 2015-10-14 (×2): 40 mg via INTRAVENOUS
  Filled 2015-10-13 (×2): qty 40

## 2015-10-13 MED ORDER — SODIUM CHLORIDE 0.9 % IV SOLN
10.0000 mL/h | Freq: Once | INTRAVENOUS | Status: AC
  Administered 2015-10-13: 10 mL/h via INTRAVENOUS

## 2015-10-13 MED ORDER — SODIUM CHLORIDE 0.9 % IJ SOLN
3.0000 mL | Freq: Two times a day (BID) | INTRAMUSCULAR | Status: DC
  Administered 2015-10-13: 3 mL via INTRAVENOUS

## 2015-10-13 MED ORDER — SODIUM CHLORIDE 0.9 % IV SOLN
INTRAVENOUS | Status: DC
  Administered 2015-10-13: 23:00:00 via INTRAVENOUS

## 2015-10-13 MED ORDER — ONDANSETRON HCL 4 MG PO TABS: 4.0000 mg | ORAL_TABLET | Freq: Four times a day (QID) | ORAL | Status: DC | PRN

## 2015-10-13 MED ORDER — VITAMIN B-12 1000 MCG PO TABS
1000.0000 ug | ORAL_TABLET | Freq: Every day | ORAL | Status: DC
  Administered 2015-10-14: 11:00:00 1000 ug via ORAL
  Filled 2015-10-13: qty 1

## 2015-10-13 NOTE — ED Notes (Signed)
Report attempted, unit charge states it's a rule of the floor not take reports 15 minutes before or after 1900.

## 2015-10-13 NOTE — ED Notes (Signed)
Pt bib EMS w/ c/o weakness and fatigue.  Pt denies n/v/d, SOB, CP and in NAD.  Pt A/Ox4.  Pt denies pain.  Pt sts that she feels weak.  Per EMS, pt was unsteady on feet.  Pt sts she feels like this is her anemia.

## 2015-10-13 NOTE — ED Provider Notes (Signed)
[facility] Emergency Department Provider Note  ____________________________________________  Time seen: 3:10 PM  I have reviewed the triage vital signs and the nursing notes.   HISTORY  Chief Complaint Fatigue    HPI Kathryn Ware is a 74 y.o. female who complains of generalized weakness and fatigue. Gradual onset, going on for about a week. Worse when she gets up and stands her upright and walks around. Denies any chest pain shortness of breath nausea vomiting diarrhea abdominal pain back pain. No syncope. No head injury.  No dysuria frequency urgency cough runny nose sore throat.   Past Medical History  Diagnosis Date  . Diabetes mellitus type II   . HTN (hypertension)   . HLD (hyperlipidemia)   . RLS (restless legs syndrome)   . GI AVM (gastrointestinal arteriovenous vascular malformation)     stomach  . History of echocardiogram 3/15    a. 12/2013: EF 60-65%, DD, mild LVH, nl RV size & systolic function, mild MR/TR, mild AS, nl RVSP  . History of nuclear stress test     a. 12/2013: low risk, no sig WMA, nondiag EKG 2/2 baseline LVH w/ repol abnl, no sig ischemia, EF 70% no artifact     Patient Active Problem List   Diagnosis Date Noted  . 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 AKI (acute kidney injury) ([place]) 03/19/2015  . anemia 03/10/2015  . Acute posthemorrhagic anemia 03/10/2015  . Anemia due to gastrointestinal blood loss 02/18/2015  . Advance directive discussed with patient 02/18/2015  . Nicotine dependence 10/29/2013  . Routine general medical examination at a health care facility 08/17/2011  . Diabetes mellitus type 2, controlled, without complications ([place]) 46/27/0350  . HYPERCHOLESTEROLEMIA 02/02/2007  . Essential hypertension, benign 02/02/2007  . Angiodysplasia of intestinal tract 02/02/2007     Past Surgical History  Procedure Laterality Date  . Abdominal hysterectomy  1976    RSO (fibroid)  . Top    .  Abdominal adhesion surgery  11/2001    Dr. Tamala Julian  . Esophagogastroduodenoscopy  07/2006    multiple angioectasias  . Doppler echocardiography      EF 55%, mild MR, TR  . Dobutamine stress echo  11/10    normal  . Laryngeal polyp  10/2008    Dr. Tami Ribas  . Esophagogastroduodenoscopy Left 03/12/2015    Procedure: place tube in throat and evaluate stomach and duodenum for source of bleeding. Cauterize if concern for rebleeding.;  Surgeon: Hulen Luster, MD;  Location: [facility] ENDOSCOPY;  Service: Endoscopy;  Laterality: Left;     Current Outpatient Rx  Name  Route  Sig  Dispense  Refill  . clonazePAM (KLONOPIN) 0.5 MG tablet   Oral   Take 0.5 mg by mouth at bedtime.         . ferrous sulfate 325 (65 FE) MG tablet   Oral   Take 325 mg by mouth daily with breakfast.           . glipiZIDE (GLUCOTROL XL) 10 MG 24 hr tablet      TAKE 1 TABLET BY MOUTH TWICE DAILY   60 tablet   11   . lisinopril-hydrochlorothiazide (PRINZIDE,ZESTORETIC) 20-12.5 MG per tablet   Oral   Take 2 tablets by mouth daily.   60 tablet   11   . metFORMIN (GLUCOPHAGE-XR) 500 MG 24 hr tablet      TAKE 1 TABLET BY MOUTH TWICE DAILY   60 tablet   0   . omeprazole (PRILOSEC)  40 MG capsule   Oral   Take 1 capsule (40 mg total) by mouth 2 (two) times daily before a meal.   60 capsule   3   . simvastatin (ZOCOR) 80 MG tablet      TAKE 1 TABLET BY MOUTH EVERY DAY   90 tablet   0   . sucralfate (CARAFATE) 1 G tablet      TAKE 1 TABLET BY MOUTH BEFORE MEALS AND EVERY NIGHT AT BEDTIME   120 tablet   0   . vitamin B-12 (CYANOCOBALAMIN) 1000 MCG tablet   Oral   Take 1,000 mcg by mouth daily.            Allergies Metformin and Nifedipine   Family History  Problem Relation Age of Onset  . Stroke Father   . Hypertension Mother   . Hypertension      sibling  . Diabetes      grandmother  . Cancer Sister     breast cancer    Social History Social History  Substance Use Topics  . Smoking  status: Current Every Day Smoker -- 0.50 packs/day  . Smokeless tobacco: Never Used  . Alcohol Use: No    Review of Systems  Constitutional:   No fever or chills. No weight changes Eyes:   No blurry vision or double vision.  ENT:   No sore throat. Cardiovascular:   No chest pain. Respiratory:   No dyspnea or cough. Gastrointestinal:   Negative for abdominal pain, vomiting and diarrhea.  Chronic black stool. On iron supplements Genitourinary:   Negative for dysuria, urinary retention, bloody urine, or difficulty urinating. Musculoskeletal:   Negative for back pain. No joint swelling or pain. Skin:   Negative for rash. Neurological:   Negative for headaches, focal weakness or numbness. Psychiatric:  No anxiety or depression.   Endocrine:  No hot/cold intolerance, changes in energy, or sleep difficulty.  10-point ROS otherwise negative.  ____________________________________________   PHYSICAL EXAM:  VITAL SIGNS: ED Triage Vitals  Enc Vitals Group     BP 10/13/15 1523 127/58 mmHg     Pulse Rate 10/13/15 1523 105     Resp 10/13/15 1523 23     Temp 10/13/15 1523 98.1 F (36.7 C)     Temp Source 10/13/15 1523 Oral     SpO2 10/13/15 1524 100 %     Weight 10/13/15 1524 125 lb (56.7 kg)     Height 10/13/15 1524 '5\' 5"'$  (1.651 m)     Head Cir --      Peak Flow --      Pain Score 10/13/15 1524 0     Pain Loc --      Pain Edu? --      Excl. in [place]? --     Vital signs reviewed, nursing assessments reviewed.   Constitutional:   Alert and oriented. Well appearing and in no distress. Eyes:   No scleral icterus. Positive conjunctival pallor. PERRL. EOMI ENT   Head:   Normocephalic and atraumatic.   Nose:   No congestion/rhinnorhea. No septal hematoma   Mouth/Throat:   Dry mucous membranes, no pharyngeal erythema. No peritonsillar mass. No uvula shift.   Neck:   No stridor. No SubQ emphysema. No meningismus. Hematological/Lymphatic/Immunilogical:   No cervical  lymphadenopathy. Cardiovascular:   Tachycardia heart rate 105. Normal and symmetric distal pulses are present in all extremities. No murmurs, rubs, or gallops. Respiratory:   Normal respiratory effort without tachypnea nor retractions. Breath sounds  are clear and equal bilaterally. No wheezes/rales/rhonchi. Gastrointestinal:   Soft and nontender. No distention. There is no CVA tenderness.  No rebound, rigidity, or guarding.rectal exam reveals melanotic black stool. Strongly Hemoccult-positive Genitourinary:   deferred Musculoskeletal:   Nontender with normal range of motion in all extremities. No joint effusions.  No lower extremity tenderness.  No edema. Neurologic:   Normal speech and language.  CN 2-10 normal. Motor grossly intact. No pronator drift.  Normal gait. No gross focal neurologic deficits are appreciated.  Skin:    Skin is warm, dry and intact. No rash noted.  No petechiae, purpura, or bullae. Psychiatric:   Mood and affect are normal. Speech and behavior are normal. Patient exhibits appropriate insight and judgment.  ____________________________________________    LABS (pertinent positives/negatives) (all labs ordered are listed, but only abnormal results are displayed) Labs Reviewed  BASIC METABOLIC PANEL - Abnormal; Notable for the following:    CO2 21 (*)    Glucose, Bld 177 (*)    Creatinine, Ser 1.23 (*)    GFR calc non Af Amer 42 (*)    GFR calc Af Amer 49 (*)    All other components within normal limits  CBC - Abnormal; Notable for the following:    RBC 2.41 (*)    Hemoglobin 6.4 (*)    HCT 20.6 (*)    MCHC 30.9 (*)    RDW 15.9 (*)    All other components within normal limits  GLUCOSE, CAPILLARY - Abnormal; Notable for the following:    Glucose-Capillary 159 (*)    All other components within normal limits  URINALYSIS COMPLETEWITH MICROSCOPIC ([facility] ONLY)  CBG MONITORING, ED  PREPARE RBC (CROSSMATCH)  TYPE AND SCREEN    ____________________________________________   EKG  Interpreted by me Sinus tachycardia rate 101, normal axis, normal intervals. Poor R-wave progression in anterior precordial leads. Normal ST segments. There is LVH with repolarization abnormality with T-wave inversions in the lateral leads.  ____________________________________________    RADIOLOGY    ____________________________________________   PROCEDURES CRITICAL CARE Performed by: Carrie Mew   Total critical care time: 35 minutes  Critical care time was exclusive of separately billable procedures and treating other patients.  Critical care was necessary to treat or prevent imminent or life-threatening deterioration.  Critical care was time spent personally by me on the following activities: development of treatment plan with patient and/or surrogate as well as nursing, discussions with consultants, evaluation of patient's response to treatment, examination of patient, obtaining history from patient or surrogate, ordering and performing treatments and interventions, ordering and review of laboratory studies, ordering and review of radiographic studies, pulse oximetry and re-evaluation of patient's condition.   ____________________________________________   INITIAL IMPRESSION / ASSESSMENT AND PLAN / ED COURSE  Pertinent labs & imaging results that were available during my care of the patient were reviewed by me and considered in my medical decision making (see chart for details).  Patient presents with generalized weakness and fatigue, positive GI bleeding.this is previously happened about 6 or 7 months ago requiring transfusion. It appears she has symptomatic anemia most likely. We'll check labs and give IV fluids.  ----------------------------------------- 5:05 PM on 10/13/2015 -----------------------------------------  Hemoglobin 6.4. Most recent hemoglobin was 9.9. Review of previous gastroenterology  notes shows that she was found to have duodenal AVMs on EGD, and this is likely the source of her GI bleeding today which is now causing some somatic anemia. Patient consents to blood transfusion and admission. Discussed with hospitalist for  further management. IV Protonix for now while waiting for packed red blood cells.     ____________________________________________   FINAL CLINICAL IMPRESSION(S) / ED DIAGNOSES  Final diagnoses:  Acute GI bleeding  Symptomatic anemia      Carrie Mew, MD 10/13/15 [phone]

## 2015-10-13 NOTE — H&P (Addendum)
[place] [facility] [place] NAME: Kathryn Ware    MR#:  [id number]  DATE OF BIRTH:  January 04, 1941  DATE OF ADMISSION:  10/13/2015  PRIMARY CARE PHYSICIAN: Viviana Simpler, MD   REQUESTING/REFERRING PHYSICIAN: Joni Fears MD  CHIEF COMPLAINT:   Chief Complaint  Patient presents with  . Fatigue    HISTORY OF PRESENT ILLNESS: Kathryn Ware  is a 74 y.o. female with a known history of  duodenal AVMs and gastric AVMs who is presenting with fatigue no sore the past few weeks. She is also been very weak and tired. Has also been short of breath. She reports dyspnea on exertion but no chest pain no wheezing no cough or fever. Patient had similar presentation few months prior in May where she was admitted with the same symptoms due to symptomatic anemia. Patient again noted to have a very low hemoglobin of 6.4. Last hemoglobin on discharge was 9.9. In the emergency room guaiac was done which was positive. He has not noticed any blood in her stool.  PAST MEDICAL HISTORY:   Past Medical History  Diagnosis Date  . Diabetes mellitus type II   . HTN (hypertension)   . HLD (hyperlipidemia)   . RLS (restless legs syndrome)   . GI AVM (gastrointestinal arteriovenous vascular malformation)     stomach  . History of echocardiogram 3/15    a. 12/2013: EF 60-65%, DD, mild LVH, nl RV size & systolic function, mild MR/TR, mild AS, nl RVSP  . History of nuclear stress test     a. 12/2013: low risk, no sig WMA, nondiag EKG 2/2 baseline LVH w/ repol abnl, no sig ischemia, EF 70% no artifact    PAST SURGICAL HISTORY:  Past Surgical History  Procedure Laterality Date  . Abdominal hysterectomy  1976    RSO (fibroid)  . Top    . Abdominal adhesion surgery  11/2001    Dr. Tamala Julian  . Esophagogastroduodenoscopy  07/2006    multiple angioectasias  . Doppler echocardiography      EF 55%, mild MR, TR  . Dobutamine stress echo  11/10    normal  . Laryngeal polyp  10/2008     Dr. Tami Ribas  . Esophagogastroduodenoscopy Left 03/12/2015    Procedure: place tube in throat and evaluate stomach and duodenum for source of bleeding. Cauterize if concern for rebleeding.;  Surgeon: Hulen Luster, MD;  Location: [facility] ENDOSCOPY;  Service: Endoscopy;  Laterality: Left;    SOCIAL HISTORY:  Social History  Substance Use Topics  . Smoking status: Current Every Day Smoker -- 0.50 packs/day  . Smokeless tobacco: Never Used  . Alcohol Use: No    FAMILY HISTORY:  Family History  Problem Relation Age of Onset  . Stroke Father   . Hypertension Mother   . Hypertension      sibling  . Diabetes      grandmother  . Cancer Sister     breast cancer    DRUG ALLERGIES:  Allergies  Allergen Reactions  . Metformin     REACTION: diarrhea, patient aware that she is onmetformin when it is listed that she is allergic  . Nifedipine     REACTION: cough    REVIEW OF SYSTEMS:   CONSTITUTIONAL: No fever, positive fatigue and weakness.  EYES: No blurred or double vision.  EARS, NOSE, AND THROAT: No tinnitus or ear pain.  RESPIRATORY: No cough, positive shortness of breath, wheezing or hemoptysis.  CARDIOVASCULAR: No chest  pain, orthopnea, edema.  GASTROINTESTINAL: No nausea, vomiting, diarrhea or abdominal pain.  GENITOURINARY: No dysuria, hematuria.  ENDOCRINE: No polyuria, nocturia,  HEMATOLOGY: No anemia, easy bruising or bleeding SKIN: No rash or lesion. MUSCULOSKELETAL: No joint pain or arthritis.   NEUROLOGIC: No tingling, numbness, weakness.  PSYCHIATRY: No anxiety or depression.   MEDICATIONS AT HOME:  Prior to Admission medications   Medication Sig Start Date End Date Taking? Authorizing Provider  clonazePAM (KLONOPIN) 0.5 MG tablet Take 0.5 mg by mouth at bedtime.   Yes Historical Provider, MD  ferrous sulfate 325 (65 FE) MG tablet Take 325 mg by mouth daily with breakfast.     Yes Historical Provider, MD  glipiZIDE (GLUCOTROL XL) 10 MG 24 hr tablet TAKE 1 TABLET BY  MOUTH TWICE DAILY 02/16/15  Yes Venia Carbon, MD  lisinopril-hydrochlorothiazide (PRINZIDE,ZESTORETIC) 20-12.5 MG per tablet Take 2 tablets by mouth daily. 02/18/15  Yes Venia Carbon, MD  metFORMIN (GLUCOPHAGE-XR) 500 MG 24 hr tablet TAKE 1 TABLET BY MOUTH TWICE DAILY 08/31/15  Yes Venia Carbon, MD  omeprazole (PRILOSEC) 40 MG capsule Take 1 capsule (40 mg total) by mouth 2 (two) times daily before a meal. 08/06/15  Yes Venia Carbon, MD  simvastatin (ZOCOR) 80 MG tablet TAKE 1 TABLET BY MOUTH EVERY DAY 08/04/15  Yes Venia Carbon, MD  sucralfate (CARAFATE) 1 G tablet TAKE 1 TABLET BY MOUTH BEFORE MEALS AND EVERY NIGHT AT BEDTIME 05/18/15  Yes Venia Carbon, MD  vitamin B-12 (CYANOCOBALAMIN) 1000 MCG tablet Take 1,000 mcg by mouth daily.   Yes Historical Provider, MD      PHYSICAL EXAMINATION:   VITAL SIGNS: Blood pressure 142/70, pulse 96, temperature 98.1 F (36.7 C), temperature source Oral, resp. rate 23, height '5\' 5"'$  (1.651 m), weight 56.7 kg (125 lb), SpO2 100 %.  GENERAL:  74 y.o.-year-old patient lying in the bed with no acute distress.  EYES: Pupils equal, round, reactive to light and accommodation. No scleral icterus. Extraocular muscles intact. Conjunctiva pale HEENT: Head atraumatic, normocephalic. Oropharynx and nasopharynx clear.  NECK:  Supple, no jugular venous distention. No thyroid enlargement, no tenderness.  LUNGS: Normal breath sounds bilaterally, no wheezing, rales,rhonchi or crepitation. No use of accessory muscles of respiration.  CARDIOVASCULAR: S1, S2 normal. No murmurs, rubs, or gallops.  ABDOMEN: Soft, nontender, nondistended. Bowel sounds present. No organomegaly or mass.  EXTREMITIES: No pedal edema, cyanosis, or clubbing.  NEUROLOGIC: Cranial nerves II through XII are intact. Muscle strength 5/5 in all extremities. Sensation intact. Gait not checked.  PSYCHIATRIC: The patient is alert and oriented x 3.  SKIN: No obvious rash, lesion, or ulcer.    LABORATORY PANEL:   CBC  Recent Labs Lab 10/13/15 1607  WBC 7.2  HGB 6.4*  HCT 20.6*  PLT 375  MCV 85.6  MCH 26.4  MCHC 30.9*  RDW 15.9*   ------------------------------------------------------------------------------------------------------------------  Chemistries   Recent Labs Lab 10/13/15 1607  NA 137  K 3.9  CL 110  CO2 21*  GLUCOSE 177*  BUN 20  CREATININE 1.23*  CALCIUM 9.1   ------------------------------------------------------------------------------------------------------------------ estimated creatinine clearance is 36.5 mL/min (by C-G formula based on Cr of 1.23). ------------------------------------------------------------------------------------------------------------------ No results for input(s): TSH, T4TOTAL, T3FREE, THYROIDAB in the last 72 hours.  Invalid input(s): FREET3   Coagulation profile No results for input(s): INR, PROTIME in the last 168 hours. ------------------------------------------------------------------------------------------------------------------- No results for input(s): DDIMER in the last 72 hours. -------------------------------------------------------------------------------------------------------------------  Cardiac Enzymes No results for input(s): CKMB, TROPONINI,  MYOGLOBIN in the last 168 hours.  Invalid input(s): CK ------------------------------------------------------------------------------------------------------------------ Invalid input(s): POCBNP  ---------------------------------------------------------------------------------------------------------------  Urinalysis No results found for: COLORURINE, APPEARANCEUR, LABSPEC, PHURINE, GLUCOSEU, HGBUR, BILIRUBINUR, KETONESUR, PROTEINUR, UROBILINOGEN, NITRITE, LEUKOCYTESUR   RADIOLOGY: No results found.  EKG: Orders placed or performed during the hospital encounter of 10/13/15  . ED EKG  . ED EKG    IMPRESSION AND PLAN:  Patient is a  74 year old American female presenting with shortness of breath and weakness  1. Shortness of breath and weakness due to his symptomatic anemia. Patient has been consented by the ER physician for 2 units of packed RBCs she will be transfused. She is agreeable to the transfusion.  2. Acute on chronic upper GI bleed likely related to her AVMs. Patient will be placed on Protonix twice a day a GI consult will be obtained.  3. Diabetes type 2 continue Glucotrol and metformin I'll place her on sliding scale insulin  4. Hypertension continue lisinopril and HCTZ.  5. Miscellaneous SCDs for DVT prophylaxis  6. Nicotine addictions smoking cessation provided 4 minutes spent strongly recommended patient stop smoking she will be offered a nicotine replacement therapy.   All the records are reviewed and case discussed with ED provider. Management plans discussed with the patient, family and they are in agreement.  CODE STATUS: Full    TOTAL TIME TAKING CARE OF THIS PATIENT: 29 minutes    Dustin Flock M.D on 10/13/2015 at 5:26 PM  Between 7am to 6pm - Pager - [phone]  After 6pm go to [email or web address] - password [email or web address] [facility]  Office  [phone]  CC: Primary care physician; Viviana Simpler, MD

## 2015-10-14 DIAGNOSIS — E785 Hyperlipidemia, unspecified: Secondary | ICD-10-CM | POA: Diagnosis not present

## 2015-10-14 DIAGNOSIS — I10 Essential (primary) hypertension: Secondary | ICD-10-CM | POA: Diagnosis not present

## 2015-10-14 DIAGNOSIS — K922 Gastrointestinal hemorrhage, unspecified: Secondary | ICD-10-CM | POA: Diagnosis not present

## 2015-10-14 DIAGNOSIS — D649 Anemia, unspecified: Secondary | ICD-10-CM | POA: Diagnosis not present

## 2015-10-14 LAB — BASIC METABOLIC PANEL
ANION GAP: 6 (ref 5–15)
BUN: 16 mg/dL (ref 6–20)
CALCIUM: 8.9 mg/dL (ref 8.9–10.3)
CHLORIDE: 116 mmol/L — AB (ref 101–111)
CO2: 21 mmol/L — AB (ref 22–32)
Creatinine, Ser: 1.21 mg/dL — ABNORMAL HIGH (ref 0.44–1.00)
GFR calc non Af Amer: 43 mL/min — ABNORMAL LOW (ref >60)
GFR, EST AFRICAN AMERICAN: 50 mL/min — AB (ref >60)
GLUCOSE: 48 mg/dL — AB (ref 65–99)
POTASSIUM: 4.1 mmol/L (ref 3.5–5.1)
Sodium: 143 mmol/L (ref 135–145)

## 2015-10-14 LAB — CBC
HEMATOCRIT: 29.1 % — AB (ref 35.0–47.0)
HEMOGLOBIN: 9.3 g/dL — AB (ref 12.0–16.0)
MCH: 27.4 pg (ref 26.0–34.0)
MCHC: 32.1 g/dL (ref 32.0–36.0)
MCV: 85.3 fL (ref 80.0–100.0)
Platelets: 312 10*3/uL (ref 150–440)
RBC: 3.42 MIL/uL — ABNORMAL LOW (ref 3.80–5.20)
RDW: 15.3 % — AB (ref 11.5–14.5)
WBC: 7.9 10*3/uL (ref 3.6–11.0)

## 2015-10-14 LAB — GLUCOSE, CAPILLARY
GLUCOSE-CAPILLARY: 59 mg/dL — AB (ref 65–99)
GLUCOSE-CAPILLARY: 90 mg/dL (ref 65–99)
Glucose-Capillary: 95 mg/dL (ref 65–99)

## 2015-10-14 MED ORDER — INSULIN ASPART 100 UNIT/ML ~~LOC~~ SOLN: 0.0000 [IU] | Freq: Three times a day (TID) | SUBCUTANEOUS | Status: DC

## 2015-10-14 MED ORDER — INSULIN ASPART 100 UNIT/ML ~~LOC~~ SOLN: 0.0000 [IU] | Freq: Every day | SUBCUTANEOUS | Status: DC

## 2015-10-14 NOTE — Plan of Care (Signed)
Problem: Education: Goal: Knowledge of [facility] Education information/materials will improve Outcome: Progressing Pt likes to be call Kathryn Ware    Past Medical History   Diagnosis  Date   .  Diabetes mellitus type II     .  HTN (hypertension)     .  HLD (hyperlipidemia)     .  RLS (restless legs syndrome)     .  GI AVM (gastrointestinal arteriovenous vascular malformation)         stomach   .  History of echocardiogram  3/15       a. 12/2013: EF 60-65%, DD, mild LVH, nl RV size & systolic function, mild MR/TR, mild AS, nl RVSP   .  History of nuclear stress test         a. 12/2013: low risk, no sig WMA, nondiag EKG 2/2 baseline LVH w/ repol abnl, no sig ischemia, EF 70% no artifact           Pt is medically controlled with home medications.     Problem: Physical Regulation: Goal: Complications related to the disease process, condition or treatment will be avoided or minimized Outcome: Progressing Pt alert and oriented. No c/o pain nor distress noted. Second unit of blood transfusing. NSR with depressed T wave on monitor. Continue to monitor.

## 2015-10-14 NOTE — Plan of Care (Signed)
Pt d/ced home. States she feels much better.  Not weak or SOB.   Hgb improved to 9.3 from 6.4 after receiving 2 u PRBC.  Pt is + for occult blood.  Will f/u w/GI outpt and if scope is necessary will do it outpt.  Consulted w/diabetes educator b/c blood sugar was low this am.  Pt states that she is chronically low in the am.  Pt is on metformin and glipizide.  Educator recommended that glipizide be d/ced while she's in pt.  Was also put on sliding scale.  BS was 95.  D/c instructions reviewed; IV removed.  Pt going back home where she lives alone.  Very capable - very steady on her feet. Pt remained safe during admission.

## 2015-10-14 NOTE — Progress Notes (Signed)
Inpatient Diabetes Program Recommendations  AACE/ADA: New Consensus Statement on Inpatient Glycemic Control (2015)  Target Ranges:  Prepandial:   less than 140 mg/dL      Peak postprandial:   less than 180 mg/dL (1-2 hours)      Critically ill patients:  140 - 180 mg/dL   Review of Glycemic Control  Results for Kathryn Ware, Kathryn Ware (MRN [id number]) as of 10/14/2015 10:06  Ref. Range 10/13/2015 16:04 10/14/2015 06:12 10/14/2015 06:34  Glucose-Capillary Latest Ref Range: 65-99 mg/dL 159 (H) 59 (L) 90   Diabetes history: Type 2 Outpatient Diabetes medications: Glucotrol '10mg'$  bid, Metformin '500mg'$  twice a day Current orders for Inpatient glycemic control: Glucotrol '10mg'$  bid, Metformin '500mg'$  twice a day  Inpatient Diabetes Program Recommendations: Given the recent low blood sugars, please consider stopping Glucotrol while patient is inpatient.  Consider starting Novolog 0-9 units tid and 0-5 units qhs  Gentry Fitz, RN, [place], [place], CDE Diabetes Coordinator Inpatient Diabetes Program  [phone] (Team Pager) [phone] ([place]) 10/14/2015 10:09 AM

## 2015-10-14 NOTE — Progress Notes (Signed)
Consult cancelled and patient discharged prior to being seen.

## 2015-10-14 NOTE — Discharge Summary (Signed)
[facility] [place] NAME: Kathryn Ware    MR#:  [id number]  DATE OF BIRTH:  [birth year]  DATE OF ADMISSION:  10/13/2015 ADMITTING PHYSICIAN: Dustin Flock, MD  DATE OF DISCHARGE: 10/14/2015  1:28 PM  PRIMARY CARE PHYSICIAN: Viviana Simpler, MD    ADMISSION DIAGNOSIS:  Acute GI bleeding [K92.2] Symptomatic anemia [D64.9]  DISCHARGE DIAGNOSIS:  Active Problems:   GI bleed   SECONDARY DIAGNOSIS:   Past Medical History  Diagnosis Date  . Diabetes mellitus type II   . HTN (hypertension)   . HLD (hyperlipidemia)   . RLS (restless legs syndrome)   . GI AVM (gastrointestinal arteriovenous vascular malformation)     stomach  . History of echocardiogram 3/15    a. 12/2013: EF 60-65%, DD, mild LVH, nl RV size & systolic function, mild MR/TR, mild AS, nl RVSP  . History of nuclear stress test     a. 12/2013: low risk, no sig WMA, nondiag EKG 2/2 baseline LVH w/ repol abnl, no sig ischemia, EF 70% no artifact    HOSPITAL COURSE:   74 year old female with past medical history of diabetes, hypertension, hyperlipidemia, restless leg syndrome, history of previous GI bleed due to AV malformations in the stomach/duodenum, GERD who presented to the hospital due to shortness of breath and noted to have symptomatic anemia with a GI bleed.  #1 symptomatic anemia-patient presented to the hospital due to shortness of breath and weakness and noted to have a hemoglobin of 6.4. -Patient was transfused 2 units of packed red blood cells while in the hospital her hemoglobin improved to 9 and she clinically felt much better and her shortness of breath/weakness had resolved.  #2 GI bleed-patient was noted to have heme positive stools and noted to be anemic. She has a previous history of an upper GI bleed with AV malformations in her stomach/duodenum. A gastroenterology consult was obtained but they did not have a chance to see her as patient wanted to go  home and follow-up with them as an outpatient. - They Recommended doing an endoscopy but patient would prefer to get that done as an outpatient and she will follow up with gastroenterology next week.  #3 diabetes type 2 without complication-she had one episode of hypoglycemia which resolved with some dextrose. -She remained clinically stable otherwise and will resume her metformin and Glucotrol.  #4 hyperlipidemia-patient will continue her simvastatin.  #5 Anxiety- she will continue her Klonopin.  #6 hypertension-patient will resume her lisinopril/HCTZ.  DISCHARGE CONDITIONS:   Stable  CONSULTS OBTAINED:  Treatment Team:  Dustin Flock, MD  DRUG ALLERGIES:   Allergies  Allergen Reactions  . Metformin     REACTION: diarrhea, patient aware that she is onmetformin when it is listed that she is allergic  . Nifedipine     REACTION: cough    DISCHARGE MEDICATIONS:   Discharge Medication List as of 10/14/2015 12:16 PM    CONTINUE these medications which have NOT CHANGED   Details  clonazePAM (KLONOPIN) 0.5 MG tablet Take 0.5 mg by mouth at bedtime., Until Discontinued, Historical Med    ferrous sulfate 325 (65 FE) MG tablet Take 325 mg by mouth daily with breakfast.  , Until Discontinued, Historical Med    glipiZIDE (GLUCOTROL XL) 10 MG 24 hr tablet TAKE 1 TABLET BY MOUTH TWICE DAILY, Normal    lisinopril-hydrochlorothiazide (PRINZIDE,ZESTORETIC) 20-12.5 MG per tablet Take 2 tablets by mouth daily., Starting 02/18/2015, Until Discontinued, Normal  metFORMIN (GLUCOPHAGE-XR) 500 MG 24 hr tablet TAKE 1 TABLET BY MOUTH TWICE DAILY, Normal    omeprazole (PRILOSEC) 40 MG capsule Take 1 capsule (40 mg total) by mouth 2 (two) times daily before a meal., Starting 08/06/2015, Until Discontinued, Normal    simvastatin (ZOCOR) 80 MG tablet TAKE 1 TABLET BY MOUTH EVERY DAY, Normal    sucralfate (CARAFATE) 1 G tablet TAKE 1 TABLET BY MOUTH BEFORE MEALS AND EVERY NIGHT AT BEDTIME,  Normal    vitamin B-12 (CYANOCOBALAMIN) 1000 MCG tablet Take 1,000 mcg by mouth daily., Until Discontinued, Historical Med         DISCHARGE INSTRUCTIONS:   DIET:  Cardiac diet and Diabetic diet  DISCHARGE CONDITION:  Stable  ACTIVITY:  Activity as tolerated  OXYGEN:  Home Oxygen: No.   Oxygen Delivery: room air  DISCHARGE LOCATION:  home   If you experience worsening of your admission symptoms, develop shortness of breath, life threatening emergency, suicidal or homicidal thoughts you must seek medical attention immediately by calling 911 or calling your MD immediately  if symptoms less severe.  You Must read complete instructions/literature along with all the possible adverse reactions/side effects for all the Medicines you take and that have been prescribed to you. Take any new Medicines after you have completely understood and accpet all the possible adverse reactions/side effects.   Please note  You were cared for by a hospitalist during your hospital stay. If you have any questions about your discharge medications or the care you received while you were in the hospital after you are discharged, you can call the unit and asked to speak with the hospitalist on call if the hospitalist that took care of you is not available. Once you are discharged, your primary care physician will handle any further medical issues. Please note that NO REFILLS for any discharge medications will be authorized once you are discharged, as it is imperative that you return to your primary care physician (or establish a relationship with a primary care physician if you do not have one) for your aftercare needs so that they can reassess your need for medications and monitor your lab values.     Today   No abdominal pain, nausea, vomiting. No chest pain, shortness of breath, hematochezia/melena.  VITAL SIGNS:  Blood pressure 129/47, pulse 82, temperature 98 F (36.7 C), temperature source Oral,  resp. rate 18, height '5\' 5"'$  (1.651 m), weight 57.425 kg (126 lb 9.6 oz), SpO2 98 %.  I/O:   Intake/Output Summary (Last 24 hours) at 10/14/15 1548 Last data filed at 10/14/15 1029  Gross per 24 hour  Intake 1009.5 ml  Output      0 ml  Net 1009.5 ml    PHYSICAL EXAMINATION:  GENERAL:  74 y.o.-year-old patient lying in the bed with no acute distress.  EYES: Pupils equal, round, reactive to light and accommodation. No scleral icterus. Extraocular muscles intact.  HEENT: Head atraumatic, normocephalic. Oropharynx and nasopharynx clear.  NECK:  Supple, no jugular venous distention. No thyroid enlargement, no tenderness.  LUNGS: Normal breath sounds bilaterally, no wheezing, rales,rhonchi. No use of accessory muscles of respiration.  CARDIOVASCULAR: S1, S2 normal. No murmurs, rubs, or gallops.  ABDOMEN: Soft, non-tender, non-distended. Bowel sounds present. No organomegaly or mass.  EXTREMITIES: No pedal edema, cyanosis, or clubbing.  NEUROLOGIC: Cranial nerves II through XII are intact. No focal motor or sensory defecits b/l.  PSYCHIATRIC: The patient is alert and oriented x 3. Good affect.  SKIN: No  obvious rash, lesion, or ulcer.   DATA REVIEW:   CBC  Recent Labs Lab 10/14/15 0450  WBC 7.9  HGB 9.3*  HCT 29.1*  PLT 312    Chemistries   Recent Labs Lab 10/14/15 0450  NA 143  K 4.1  CL 116*  CO2 21*  GLUCOSE 48*  BUN 16  CREATININE 1.21*  CALCIUM 8.9    Cardiac Enzymes No results for input(s): TROPONINI in the last 168 hours.  Microbiology Results  No results found for this or any previous visit.  RADIOLOGY:  No results found.    Management plans discussed with the patient, family and they are in agreement.  CODE STATUS:     Code Status Orders        Start     Ordered   10/13/15 2053  Full code   Continuous     10/13/15 2053      TOTAL TIME TAKING CARE OF THIS PATIENT: 40 minutes.    Henreitta Leber M.D on 10/14/2015 at 3:48 PM  Between  7am to 6pm - Pager - [phone]  After 6pm go to [email or web address] - password [email or web address] [facility]  Office  [phone]  CC: Primary care physician; Viviana Simpler, MD

## 2015-10-14 NOTE — Discharge Instructions (Signed)
Gastrointestinal Bleeding °Gastrointestinal bleeding is bleeding somewhere along the path that food travels through the body (digestive tract). This path is anywhere between the mouth and the opening of the butt (anus). You may have blood in your throw up (vomit) or in your poop (stools). If there is a lot of bleeding, you may need to stay in the hospital. °HOME CARE °· Only take medicine as told by your doctor. °· Eat foods with fiber such as whole grains, fruits, and vegetables. You can also try eating 1 to 3 prunes a day. °· Drink enough fluids to keep your pee (urine) clear or pale yellow. °GET HELP RIGHT AWAY IF:  °· Your bleeding gets worse. °· You feel dizzy, weak, or you pass out (faint). °· You have bad cramps in your back or belly (abdomen). °· You have large blood clumps (clots) in your poop. °· Your problems are getting worse. °MAKE SURE YOU:  °· Understand these instructions. °· Will watch your condition. °· Will get help right away if you are not doing well or get worse. °  °This information is not intended to replace advice given to you by your health care provider. Make sure you discuss any questions you have with your health care provider. °  °Document Released: 07/19/2008 Document Revised: 09/26/2012 Document Reviewed: 03/30/2015 °Elsevier Interactive Patient Education ©2016 [employer] Inc[employer] ° °

## 2015-10-14 NOTE — Progress Notes (Signed)
   10/14/15 0915  Clinical Encounter Type  Visited With Patient  Visit Type Initial  Referral From Nurse  Consult/Referral To Chaplain  Spiritual Encounters  Spiritual Needs Prayer  Provided pastoral presence, prayer and support to patient on unit.  [place] [phone]

## 2015-10-14 NOTE — Plan of Care (Signed)
Problem: Physical Regulation: Goal: Complications related to the disease process, condition or treatment will be avoided or minimized Outcome: Progressing Alert and oriented. Up to the bathroom with one assist. Two units of  PRBC's this shift, tolerated well. FSBS 48 with lab and 59 with a recheck, pt assymtomatic, one 4 oz orange juice. Will recheck FSBS in 15 mins. Continue to monitor.

## 2015-10-14 NOTE — Care Management (Signed)
Admitted to [facility] with the diagnosis of GI Bleed. Niece is Burna Mortimer [phone]). Lives alone. Last seen Dr. Silvio Pate in April 2016. No home Health. No skilled facility.No home oxygen. Uses no aids for ambulation, drives. Gets medications at [facility] Co-pays are 0$, $2.00, and one medication is $8.00. No problems with paying for medications. Good appetite. No falls.  Sister will transport. Shelbie Ammons RN MSN CCM Care Management [phone]

## 2015-10-15 ENCOUNTER — Telehealth: Payer: Self-pay | Admitting: *Deleted

## 2015-10-15 LAB — TYPE AND SCREEN
ABO/RH(D): B POS
ANTIBODY SCREEN: NEGATIVE
Unit division: 0
Unit division: 0

## 2015-10-15 NOTE — Telephone Encounter (Signed)
Called pt to see how she was after her ED visit, and per pt she is feeling a lot better, pt will call if anything changes.

## 2015-10-16 ENCOUNTER — Telehealth: Payer: Self-pay

## 2015-10-16 NOTE — Telephone Encounter (Signed)
Transition Care Management Follow-up Telephone Call   Date discharged? 10/14/15  How have you been since you were released from the hospital?pt states feeling good.  Do you understand why you were in the hospital? yes   Do you understand the discharge instructions? yes  Where were you discharged to? home Items Reviewed:  Medications reviewed: yes  Allergies reviewed: yes  Dietary changes reviewed: yes, following cardiac and diabetic diet Referrals reviewed: yes, pt has appt with GI specialist on 10/28/15   Functional Questionnaire:   Activities of Daily Living (ADLs): She states they are independent in the following:ambulating, bathing,feeding, continence and dressing  States they require assistance with the following: N/A   Any transportation issues/concerns?: no   Any patient concerns? no   Confirmed importance and date/time of follow-up visits scheduled yes, f/u appt 10/21/15 at 12 noon  Provider Appointment booked with Evelena Peat  Confirmed with patient if condition begins to worsen call PCP or go to the ER.  Patient was given the office number and encouraged to call back with question or concerns.  : yes

## 2015-10-21 ENCOUNTER — Ambulatory Visit (INDEPENDENT_AMBULATORY_CARE_PROVIDER_SITE_OTHER): Payer: Medicare Other | Admitting: Internal Medicine

## 2015-10-21 ENCOUNTER — Encounter: Payer: Self-pay | Admitting: Internal Medicine

## 2015-10-21 VITALS — BP 148/60 | HR 109 | Temp 98.0°F | Wt 123.0 lb

## 2015-10-21 DIAGNOSIS — E119 Type 2 diabetes mellitus without complications: Secondary | ICD-10-CM | POA: Diagnosis not present

## 2015-10-21 DIAGNOSIS — K5520 Angiodysplasia of colon without hemorrhage: Secondary | ICD-10-CM | POA: Diagnosis not present

## 2015-10-21 DIAGNOSIS — N179 Acute kidney failure, unspecified: Secondary | ICD-10-CM | POA: Diagnosis not present

## 2015-10-21 DIAGNOSIS — D62 Acute posthemorrhagic anemia: Secondary | ICD-10-CM | POA: Diagnosis not present

## 2015-10-21 LAB — RENAL FUNCTION PANEL
ALBUMIN: 4.4 g/dL (ref 3.5–5.2)
BUN: 17 mg/dL (ref 6–23)
CALCIUM: 10.3 mg/dL (ref 8.4–10.5)
CHLORIDE: 104 meq/L (ref 96–112)
CO2: 25 mEq/L (ref 19–32)
Creatinine, Ser: 1.12 mg/dL (ref 0.40–1.20)
GFR: 61.21 mL/min (ref >60.00)
Glucose, Bld: 169 mg/dL — ABNORMAL HIGH (ref 70–99)
POTASSIUM: 4.1 meq/L (ref 3.5–5.1)
Phosphorus: 3.3 mg/dL (ref 2.3–4.6)
SODIUM: 137 meq/L (ref 135–145)

## 2015-10-21 LAB — CBC WITH DIFFERENTIAL/PLATELET
Basophils Absolute: 0 10*3/uL (ref 0.0–0.1)
Basophils Relative: 0.2 % (ref 0.0–3.0)
Eosinophils Absolute: 0 10*3/uL (ref 0.0–0.7)
Eosinophils Relative: 0.5 % (ref 0.0–5.0)
HCT: 38.3 % (ref 36.0–46.0)
HEMOGLOBIN: 12.2 g/dL (ref 12.0–15.0)
LYMPHS PCT: 12.6 % (ref 12.0–46.0)
Lymphs Abs: 1 10*3/uL (ref 0.7–4.0)
MCHC: 31.8 g/dL (ref 30.0–36.0)
MCV: 86 fl (ref 78.0–100.0)
Monocytes Absolute: 0.5 10*3/uL (ref 0.1–1.0)
Monocytes Relative: 6.5 % (ref 3.0–12.0)
Neutro Abs: 6.3 10*3/uL (ref 1.4–7.7)
Neutrophils Relative %: 80.2 % — ABNORMAL HIGH (ref 43.0–77.0)
Platelets: 365 10*3/uL (ref 150.0–400.0)
RBC: 4.46 Mil/uL (ref 3.87–5.11)
RDW: 15.9 % — ABNORMAL HIGH (ref 11.5–15.5)
WBC: 7.9 10*3/uL (ref 4.0–10.5)

## 2015-10-21 LAB — HEMOGLOBIN A1C: HEMOGLOBIN A1C: 6.1 % (ref 4.6–6.5)

## 2015-10-21 NOTE — Progress Notes (Signed)
Pre visit review using our clinic review tool, if applicable. No additional management support is needed unless otherwise documented below in the visit note. 

## 2015-10-21 NOTE — Assessment & Plan Note (Signed)
May have various spots involved Last EGD ?5/16--- no active bleeding Going back to Dr Candace Cruise next week

## 2015-10-21 NOTE — Assessment & Plan Note (Signed)
Not dizzy but with apparent flow murmur and tachycardia after walking in here Will recheck If no ongoing bleed, should hopefully improve over time

## 2015-10-21 NOTE — Assessment & Plan Note (Signed)
Seems to be controlled Due for A1c

## 2015-10-21 NOTE — Assessment & Plan Note (Signed)
Hopefully just prerenal Will check again

## 2015-10-21 NOTE — Progress Notes (Signed)
Subjective:    Patient ID: Kathryn Ware, female    DOB: 1941-06-25, 74 y.o.   MRN: [id number]  HPI Here for follow up of recent hospitalization--- another GI bleed Reviewed hospital records and discharge summary Was transfused and she left quickly Deferred GI evaluation due to early discharge  Seems okay since going home Not aware of bleeding --just felt bad (ready to "black out") Does have appt with GI-- should have EGD again (at [facility]) Stools are dark--now on iron though  Not having the dizziness now No nausea or vomiting No diarrhea  Sugars okay Checks every day lately  AKI noted ?prerenal with dehydration from bleeding Did get IV fluids Will recheck this  Current Outpatient Prescriptions on File Prior to Visit  Medication Sig Dispense Refill  . clonazePAM (KLONOPIN) 0.5 MG tablet Take 0.5 mg by mouth at bedtime.    . ferrous sulfate 325 (65 FE) MG tablet Take 325 mg by mouth daily with breakfast.      . glipiZIDE (GLUCOTROL XL) 10 MG 24 hr tablet TAKE 1 TABLET BY MOUTH TWICE DAILY 60 tablet 11  . lisinopril-hydrochlorothiazide (PRINZIDE,ZESTORETIC) 20-12.5 MG per tablet Take 2 tablets by mouth daily. 60 tablet 11  . metFORMIN (GLUCOPHAGE-XR) 500 MG 24 hr tablet TAKE 1 TABLET BY MOUTH TWICE DAILY 60 tablet 0  . omeprazole (PRILOSEC) 40 MG capsule Take 1 capsule (40 mg total) by mouth 2 (two) times daily before a meal. 60 capsule 3  . simvastatin (ZOCOR) 80 MG tablet TAKE 1 TABLET BY MOUTH EVERY DAY 90 tablet 0  . sucralfate (CARAFATE) 1 G tablet TAKE 1 TABLET BY MOUTH BEFORE MEALS AND EVERY NIGHT AT BEDTIME 120 tablet 0  . vitamin B-12 (CYANOCOBALAMIN) 1000 MCG tablet Take 1,000 mcg by mouth daily.     No current facility-administered medications on file prior to visit.    Allergies  Allergen Reactions  . Metformin     REACTION: diarrhea, patient aware that she is onmetformin when it is listed that she is allergic  . Nifedipine     REACTION: cough    Past  Medical History  Diagnosis Date  . Diabetes mellitus type II   . HTN (hypertension)   . HLD (hyperlipidemia)   . RLS (restless legs syndrome)   . GI AVM (gastrointestinal arteriovenous vascular malformation)     stomach  . History of echocardiogram 3/15    a. 12/2013: EF 60-65%, DD, mild LVH, nl RV size & systolic function, mild MR/TR, mild AS, nl RVSP  . History of nuclear stress test     a. 12/2013: low risk, no sig WMA, nondiag EKG 2/2 baseline LVH w/ repol abnl, no sig ischemia, EF 70% no artifact    Past Surgical History  Procedure Laterality Date  . Abdominal hysterectomy  1976    RSO (fibroid)  . Top    . Abdominal adhesion surgery  11/2001    Dr. Tamala Julian  . Esophagogastroduodenoscopy  07/2006    multiple angioectasias  . Doppler echocardiography      EF 55%, mild MR, TR  . Dobutamine stress echo  11/10    normal  . Laryngeal polyp  10/2008    Dr. Tami Ribas  . Esophagogastroduodenoscopy Left 03/12/2015    Procedure: place tube in throat and evaluate stomach and duodenum for source of bleeding. Cauterize if concern for rebleeding.;  Surgeon: Hulen Luster, MD;  Location: [facility] ENDOSCOPY;  Service: Endoscopy;  Laterality: Left;    Family History  Problem  Relation Age of Onset  . Stroke Father   . Hypertension Mother   . Hypertension      sibling  . Diabetes      grandmother  . Cancer Sister     breast cancer    Social History   Social History  . Marital Status: Widowed    Spouse Name: N/A  . Number of Children: 1  . Years of Education: N/A   Occupational History  . 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 Smoking status: Current Every Day Smoker -- 0.50 packs/day    Types: Cigarettes  . Smokeless tobacco: Never Used  . Alcohol Use: No  . Drug Use: No  . Sexual Activity: Not on file   Other Topics Concern  . Not on file   Social History Narrative   No living will   Requests son Orson Gear as  health care POA   Would accept resuscitation    Not sure about feeding tubes   Review of Systems Appetite isn't that great Weight is down a few pounds Not sleeping well     Objective:   Physical Exam  Cardiovascular: Normal rate and regular rhythm.  Exam reveals no gallop.   HR down to 90 after sitting Gr 3/6 aortic systolic murmur  Pulmonary/Chest: Effort normal and breath sounds normal. No respiratory distress. She has no wheezes. She has no rales.  Abdominal: Soft. Bowel sounds are normal. She exhibits no distension. There is no tenderness. There is no rebound and no guarding.  Musculoskeletal: She exhibits no edema.          Assessment & Plan:

## 2015-10-28 DIAGNOSIS — D62 Acute posthemorrhagic anemia: Secondary | ICD-10-CM | POA: Diagnosis not present

## 2015-10-28 DIAGNOSIS — R1310 Dysphagia, unspecified: Secondary | ICD-10-CM | POA: Diagnosis not present

## 2015-10-29 ENCOUNTER — Encounter: Payer: Self-pay | Admitting: *Deleted

## 2015-10-29 ENCOUNTER — Other Ambulatory Visit: Payer: Self-pay | Admitting: Internal Medicine

## 2015-11-20 ENCOUNTER — Encounter: Payer: Self-pay | Admitting: *Deleted

## 2015-11-23 ENCOUNTER — Ambulatory Visit
Admission: RE | Admit: 2015-11-23 | Discharge: 2015-11-23 | Disposition: A | Discharge status: Home / Self Care | Payer: Medicare Other | Source: Ambulatory Visit | Attending: Gastroenterology | Admitting: Gastroenterology

## 2015-11-23 ENCOUNTER — Encounter
Admission: RE | Disposition: A | Discharge status: Home / Self Care | Payer: Self-pay | Source: Ambulatory Visit | Attending: Gastroenterology

## 2015-11-23 ENCOUNTER — Ambulatory Visit: Payer: Medicare Other | Admitting: Anesthesiology

## 2015-11-23 ENCOUNTER — Encounter: Payer: Self-pay | Admitting: Anesthesiology

## 2015-11-23 DIAGNOSIS — K219 Gastro-esophageal reflux disease without esophagitis: Secondary | ICD-10-CM | POA: Insufficient documentation

## 2015-11-23 DIAGNOSIS — Z79899 Other long term (current) drug therapy: Secondary | ICD-10-CM | POA: Insufficient documentation

## 2015-11-23 DIAGNOSIS — E785 Hyperlipidemia, unspecified: Secondary | ICD-10-CM | POA: Insufficient documentation

## 2015-11-23 DIAGNOSIS — K31819 Angiodysplasia of stomach and duodenum without bleeding: Secondary | ICD-10-CM | POA: Insufficient documentation

## 2015-11-23 DIAGNOSIS — Z888 Allergy status to other drugs, medicaments and biological substances status: Secondary | ICD-10-CM | POA: Diagnosis not present

## 2015-11-23 DIAGNOSIS — R1310 Dysphagia, unspecified: Secondary | ICD-10-CM | POA: Diagnosis not present

## 2015-11-23 DIAGNOSIS — I10 Essential (primary) hypertension: Secondary | ICD-10-CM | POA: Insufficient documentation

## 2015-11-23 DIAGNOSIS — F17210 Nicotine dependence, cigarettes, uncomplicated: Secondary | ICD-10-CM | POA: Insufficient documentation

## 2015-11-23 DIAGNOSIS — Z90710 Acquired absence of both cervix and uterus: Secondary | ICD-10-CM | POA: Diagnosis not present

## 2015-11-23 DIAGNOSIS — F17200 Nicotine dependence, unspecified, uncomplicated: Secondary | ICD-10-CM | POA: Insufficient documentation

## 2015-11-23 DIAGNOSIS — Q2733 Arteriovenous malformation of digestive system vessel: Secondary | ICD-10-CM | POA: Diagnosis not present

## 2015-11-23 DIAGNOSIS — D649 Anemia, unspecified: Secondary | ICD-10-CM | POA: Diagnosis not present

## 2015-11-23 DIAGNOSIS — E119 Type 2 diabetes mellitus without complications: Secondary | ICD-10-CM | POA: Diagnosis not present

## 2015-11-23 DIAGNOSIS — D500 Iron deficiency anemia secondary to blood loss (chronic): Secondary | ICD-10-CM | POA: Diagnosis not present

## 2015-11-23 HISTORY — PX: ESOPHAGOGASTRODUODENOSCOPY (EGD) WITH PROPOFOL: SHX5813

## 2015-11-23 LAB — GLUCOSE, CAPILLARY: Glucose-Capillary: 88 mg/dL (ref 65–99)

## 2015-11-23 SURGERY — ESOPHAGOGASTRODUODENOSCOPY (EGD) WITH PROPOFOL
Anesthesia: General

## 2015-11-23 MED ORDER — PROPOFOL 500 MG/50ML IV EMUL
INTRAVENOUS | Status: DC | PRN
  Administered 2015-11-23: 125 ug/kg/min via INTRAVENOUS

## 2015-11-23 MED ORDER — SODIUM CHLORIDE 0.9 % IV SOLN: INTRAVENOUS | Status: DC

## 2015-11-23 MED ORDER — SODIUM CHLORIDE 0.9 % IV SOLN
INTRAVENOUS | Status: DC
  Administered 2015-11-23: 09:00:00 via INTRAVENOUS
  Administered 2015-11-23: 1000 mL via INTRAVENOUS

## 2015-11-23 MED ORDER — PROPOFOL 10 MG/ML IV BOLUS
INTRAVENOUS | Status: DC | PRN
  Administered 2015-11-23: 50 mg via INTRAVENOUS

## 2015-11-23 NOTE — Transfer of Care (Signed)
Immediate Anesthesia Transfer of Care Note  Patient: Kathryn Ware  Procedure(s) Performed: Procedure(s): ESOPHAGOGASTRODUODENOSCOPY (EGD) WITH PROPOFOL (N/A)  Patient Location: PACU  Anesthesia Type:General  Level of Consciousness: sedated  Airway & Oxygen Therapy: Patient Spontanous Breathing and Patient connected to nasal cannula oxygen  Post-op Assessment: Report given to RN and Post -op Vital signs reviewed and stable  Post vital signs: Reviewed and stable  Last Vitals:  Filed Vitals:   11/23/15 0756  BP: 153/63  Pulse: 74  Temp: 35.8 C  Resp: 18    Complications: No apparent anesthesia complications

## 2015-11-23 NOTE — Anesthesia Preprocedure Evaluation (Addendum)
Anesthesia Evaluation  Patient identified by MRN, date of birth, ID band Patient awake    Reviewed: Allergy & Precautions, NPO status , Patient's Chart, lab work & pertinent test results, reviewed documented beta blocker date and time   Airway Mallampati: II  TM Distance: >3 FB     Dental  (+) Lower Dentures, Upper Dentures   Pulmonary Current Smoker,           Cardiovascular hypertension, Pt. on medications      Neuro/Psych    GI/Hepatic   Endo/Other  diabetes, Type 2  Renal/GU Renal InsufficiencyRenal disease     Musculoskeletal   Abdominal   Peds  Hematology  (+) anemia ,   Anesthesia Other Findings   Reproductive/Obstetrics                            Anesthesia Physical Anesthesia Plan  ASA: III  Anesthesia Plan: General   Post-op Pain Management:    Induction: Intravenous  Airway Management Planned: Nasal Cannula  Additional Equipment:   Intra-op Plan:   Post-operative Plan:   Informed Consent: I have reviewed the patients History and Physical, chart, labs and discussed the procedure including the risks, benefits and alternatives for the proposed anesthesia with the patient or authorized representative who has indicated his/her understanding and acceptance.     Plan Discussed with: CRNA  Anesthesia Plan Comments:         Anesthesia Quick Evaluation

## 2015-11-23 NOTE — H&P (Signed)
  Date of Initial H&P: 10/28/2015  History reviewed, patient examined, no change in status, stable for surgery.

## 2015-11-23 NOTE — Anesthesia Postprocedure Evaluation (Signed)
Anesthesia Post Note  Patient: Kathryn Ware  Procedure(s) Performed: Procedure(s) (LRB): ESOPHAGOGASTRODUODENOSCOPY (EGD) WITH PROPOFOL (N/A)  Patient location during evaluation: Endoscopy Anesthesia Type: General Level of consciousness: awake Pain management: pain level controlled Vital Signs Assessment: post-procedure vital signs reviewed and stable Respiratory status: spontaneous breathing Anesthetic complications: no    Last Vitals:  Filed Vitals:   11/23/15 0756 11/23/15 0918  BP: 153/63   Pulse: 74   Temp: 35.8 C 36.3 C  Resp: 18     Last Pain:  Filed Vitals:   11/23/15 0955  PainSc: Asleep                 Maribel Hadley S

## 2015-11-23 NOTE — Op Note (Signed)
[facility] Patient Name: Kathryn Ware Procedure Date: 11/23/2015 8:52 AM MRN: [id number] Account #: [id number] Date of Birth: 1940-11-08 Admit Type: Outpatient Age: 75 Room: [facility] ENDO ROOM 4 Gender: Female Note Status: Finalized Procedure:         Upper GI endoscopy Indications:       Iron deficiency anemia secondary to chronic blood loss, Hx                     of duodenal AVM's Providers:         Lupita Dawn. Candace Cruise, MD Referring MD:      Venia Carbon, MD (Referring MD) Medicines:         Monitored Anesthesia Care Complications:     No immediate complications. Procedure:         Pre-Anesthesia Assessment:                    - Prior to the procedure, a History and Physical was                     performed, and patient medications, allergies and                     sensitivities were reviewed. The patient's tolerance of                     previous anesthesia was reviewed.                    - The risks and benefits of the procedure and the sedation                     options and risks were discussed with the patient. All                     questions were answered and informed consent was obtained.                    - After reviewing the risks and benefits, the patient was                     deemed in satisfactory condition to undergo the procedure.                    After obtaining informed consent, the endoscope was passed                     under direct vision. Throughout the procedure, the                     patient's blood pressure, pulse, and oxygen saturations                     were monitored continuously. The Endoscope was introduced                     through the mouth, and advanced to the second part of                     duodenum. The patient tolerated the procedure well. The                     upper GI endoscopy was accomplished without difficulty. Findings:      The examined esophagus was normal.  The entire examined  stomach was normal.      A few small angiodysplastic lesions were found in the first part of the       duodenum. Vaporization for hemostasis using argon plasma at 0.5       liters/minute and 20 watts was successful.      The exam was otherwise without abnormality. Impression:        - Normal esophagus.                    - Normal stomach.                    - A few angiodysplastic lesions in the duodenum. Treated                     with argon plasma coagulation (APC).                    - The examination was otherwise normal.                    - No specimens collected. Recommendation:    - Discharge patient to home.                    - Observe patient's clinical course. Procedure Code(s): --- Professional ---                    [phone], Esophagogastroduodenoscopy, flexible, transoral;                     with control of bleeding, any method Diagnosis Code(s): --- Professional ---                    U03.709, Angiodysplasia of stomach and duodenum without                     bleeding                    D50.0, Iron deficiency anemia secondary to blood loss                     (chronic) CPT copyright 2014 American Medical Association. All rights reserved. The codes documented in this report are preliminary and upon coder review may  be revised to meet current compliance requirements. Hulen Luster, MD 11/23/2015 9:17:10 AM This report has been signed electronically. Number of Addenda: 0 Note Initiated On: 11/23/2015 8:52 AM      [facility]

## 2015-11-23 NOTE — Anesthesia Procedure Notes (Signed)
Date/Time: 11/23/2015 9:06 AM Performed by: Nelda Marseille Pre-anesthesia Checklist: Patient identified, Emergency Drugs available, Suction available, Patient being monitored and Timeout performed Oxygen Delivery Method: Nasal cannula

## 2015-11-24 ENCOUNTER — Encounter: Payer: Self-pay | Admitting: Gastroenterology

## 2015-12-07 ENCOUNTER — Other Ambulatory Visit: Payer: Self-pay

## 2015-12-07 MED ORDER — SIMVASTATIN 80 MG PO TABS: 80.0000 mg | ORAL_TABLET | Freq: Every day | ORAL | Status: DC

## 2015-12-07 NOTE — Telephone Encounter (Signed)
Pt request refill simvastatin to [facility]; last cpx on 02/18/15 and next XPX scheduled 02/23/16. Refilled per protocol and pt voiced understanding.

## 2015-12-15 ENCOUNTER — Other Ambulatory Visit: Payer: Self-pay | Admitting: Internal Medicine

## 2016-02-01 ENCOUNTER — Other Ambulatory Visit: Payer: Self-pay | Admitting: Internal Medicine

## 2016-02-02 LAB — HM DIABETES EYE EXAM

## 2016-02-11 ENCOUNTER — Other Ambulatory Visit: Payer: Self-pay | Admitting: Internal Medicine

## 2016-02-11 DIAGNOSIS — E119 Type 2 diabetes mellitus without complications: Secondary | ICD-10-CM | POA: Diagnosis not present

## 2016-02-11 DIAGNOSIS — H2513 Age-related nuclear cataract, bilateral: Secondary | ICD-10-CM | POA: Diagnosis not present

## 2016-02-11 DIAGNOSIS — H52223 Regular astigmatism, bilateral: Secondary | ICD-10-CM | POA: Diagnosis not present

## 2016-02-11 LAB — HM DIABETES EYE EXAM

## 2016-02-23 ENCOUNTER — Encounter: Payer: Self-pay | Admitting: Internal Medicine

## 2016-02-23 ENCOUNTER — Ambulatory Visit (INDEPENDENT_AMBULATORY_CARE_PROVIDER_SITE_OTHER): Payer: Medicare Other | Admitting: Internal Medicine

## 2016-02-23 VITALS — BP 136/66 | HR 87 | Temp 97.5°F | Ht 63.5 in | Wt 120.0 lb

## 2016-02-23 DIAGNOSIS — Z0000 Encounter for general adult medical examination without abnormal findings: Secondary | ICD-10-CM | POA: Diagnosis not present

## 2016-02-23 DIAGNOSIS — K5520 Angiodysplasia of colon without hemorrhage: Secondary | ICD-10-CM

## 2016-02-23 DIAGNOSIS — I10 Essential (primary) hypertension: Secondary | ICD-10-CM

## 2016-02-23 DIAGNOSIS — Z7189 Other specified counseling: Secondary | ICD-10-CM

## 2016-02-23 DIAGNOSIS — I700 Atherosclerosis of aorta: Secondary | ICD-10-CM

## 2016-02-23 DIAGNOSIS — IMO0001 Reserved for inherently not codable concepts without codable children: Secondary | ICD-10-CM | POA: Insufficient documentation

## 2016-02-23 DIAGNOSIS — G2581 Restless legs syndrome: Secondary | ICD-10-CM

## 2016-02-23 DIAGNOSIS — E119 Type 2 diabetes mellitus without complications: Secondary | ICD-10-CM | POA: Diagnosis not present

## 2016-02-23 LAB — COMPREHENSIVE METABOLIC PANEL
ALBUMIN: 4.6 g/dL (ref 3.5–5.2)
ALT: 10 U/L (ref 0–35)
AST: 16 U/L (ref 0–37)
Alkaline Phosphatase: 42 U/L (ref 39–117)
BILIRUBIN TOTAL: 0.4 mg/dL (ref 0.2–1.2)
BUN: 15 mg/dL (ref 6–23)
CALCIUM: 10.6 mg/dL — AB (ref 8.4–10.5)
CO2: 26 mEq/L (ref 19–32)
CREATININE: 1.09 mg/dL (ref 0.40–1.20)
Chloride: 106 mEq/L (ref 96–112)
GFR: 63.1 mL/min (ref >60.00)
Glucose, Bld: 78 mg/dL (ref 70–99)
Potassium: 4.6 mEq/L (ref 3.5–5.1)
Sodium: 140 mEq/L (ref 135–145)
Total Protein: 7.7 g/dL (ref 6.0–8.3)

## 2016-02-23 LAB — CBC WITH DIFFERENTIAL/PLATELET
BASOS ABS: 0 10*3/uL (ref 0.0–0.1)
Basophils Relative: 0.4 % (ref 0.0–3.0)
EOS PCT: 0.4 % (ref 0.0–5.0)
Eosinophils Absolute: 0 10*3/uL (ref 0.0–0.7)
HCT: 31.6 % — ABNORMAL LOW (ref 36.0–46.0)
HEMOGLOBIN: 10.1 g/dL — AB (ref 12.0–15.0)
LYMPHS PCT: 12.3 % (ref 12.0–46.0)
Lymphs Abs: 1 10*3/uL (ref 0.7–4.0)
MCHC: 31.9 g/dL (ref 30.0–36.0)
MCV: 84.7 fl (ref 78.0–100.0)
MONOS PCT: 7.4 % (ref 3.0–12.0)
Monocytes Absolute: 0.6 10*3/uL (ref 0.1–1.0)
Neutro Abs: 6.2 10*3/uL (ref 1.4–7.7)
Neutrophils Relative %: 79.5 % — ABNORMAL HIGH (ref 43.0–77.0)
Platelets: 290 10*3/uL (ref 150.0–400.0)
RBC: 3.73 Mil/uL — AB (ref 3.87–5.11)
RDW: 15.5 % (ref 11.5–15.5)
WBC: 7.8 10*3/uL (ref 4.0–10.5)

## 2016-02-23 LAB — LIPID PANEL
CHOL/HDL RATIO: 3
Cholesterol: 155 mg/dL (ref 0–200)
HDL: 44.7 mg/dL (ref >39.00)
LDL CALC: 93 mg/dL (ref 0–99)
NonHDL: 109.98
TRIGLYCERIDES: 83 mg/dL (ref 0.0–149.0)
VLDL: 16.6 mg/dL (ref 0.0–40.0)

## 2016-02-23 LAB — T4, FREE: Free T4: 0.77 ng/dL (ref 0.60–1.60)

## 2016-02-23 LAB — HM DIABETES FOOT EXAM

## 2016-02-23 LAB — HEMOGLOBIN A1C: Hgb A1c MFr Bld: 5.9 % (ref 4.6–6.5)

## 2016-02-23 MED ORDER — GLIPIZIDE ER 10 MG PO TB24: 10.0000 mg | ORAL_TABLET | Freq: Every day | ORAL | Status: DC

## 2016-02-23 MED ORDER — TETANUS-DIPHTHERIA TOXOIDS TD 5-2 LFU IM INJ: 0.5000 mL | INJECTION | Freq: Once | INTRAMUSCULAR | Status: DC

## 2016-02-23 NOTE — Assessment & Plan Note (Signed)
See social history Still has done the forms

## 2016-02-23 NOTE — Progress Notes (Signed)
Patient ID: ILAISAANE Ware, female    DOB: 1941-10-09, 75 y.o.   MRN: [id number]  HPI Here for Medicare wellness and follow up of chronic health conditions Reviewed form and advanced directives Reviewed other doctors Did have hospitalization for GI bleed Still smokes---not ready to stop (tries to cut back but just can't stop) No alcohol No falls Occasional sadness but not anhedonic Vision and hearing are okay Independent with instrumental ADLs No apparent cognitive problems  Has noise in right ear at night---keeps her up at night Sounds like a heart beat--and ringing Hearing is good Discussed using sound generator to distract her  No further GI bleeding Gets a little dizzy upon standing at times Still on iron--turns stool black  Checks sugars  Tends to be low in AM--- often 70's with symptoms Cut back on metformin--but discussed that it is the glipizide No sores, numbness or pain in feet Eye exam with Dr Kathryn Ware last month--- no retinopathy  Current Outpatient Prescriptions on File Prior to Visit  Medication Sig Dispense Refill  . clonazePAM (KLONOPIN) 0.5 MG tablet Take 0.5 mg by mouth at bedtime.    . ferrous sulfate 325 (65 FE) MG tablet Take 325 mg by mouth daily with breakfast.      . glipiZIDE (GLUCOTROL XL) 10 MG 24 hr tablet TAKE 1 TABLET BY MOUTH TWICE DAILY 180 tablet 11  . lisinopril-hydrochlorothiazide (PRINZIDE,ZESTORETIC) 20-12.5 MG tablet TAKE 2 TABLETS BY MOUTH DAILY 60 tablet 0  . metFORMIN (GLUCOPHAGE-XR) 500 MG 24 hr tablet TAKE 1 TABLET BY MOUTH TWICE DAILY 60 tablet 11  . omeprazole (PRILOSEC) 40 MG capsule TAKE 1 CAPSULE(40 MG) BY MOUTH TWICE DAILY BEFORE A MEAL 180 capsule 0  . simvastatin (ZOCOR) 80 MG tablet Take 1 tablet (80 mg total) by mouth daily. 90 tablet 0  . sucralfate (CARAFATE) 1 G tablet TAKE 1 TABLET BY MOUTH BEFORE MEALS AND EVERY NIGHT AT BEDTIME 120 tablet 0  . vitamin B-12 (CYANOCOBALAMIN) 1000 MCG tablet Take 1,000 mcg by  mouth daily.     No current facility-administered medications on file prior to visit.    Allergies  Allergen Reactions  . Metformin     REACTION: diarrhea, patient aware that she is onmetformin when it is listed that she is allergic  . Nifedipine     REACTION: cough    Past Medical History  Diagnosis Date  . Diabetes mellitus type II   . HTN (hypertension)   . HLD (hyperlipidemia)   . RLS (restless legs syndrome)   . GI AVM (gastrointestinal arteriovenous vascular malformation)     stomach  . History of echocardiogram 3/15    a. 12/2013: EF 60-65%, DD, mild LVH, nl RV size & systolic function, mild MR/TR, mild AS, nl RVSP  . History of nuclear stress test     a. 12/2013: low risk, no sig WMA, nondiag EKG 2/2 baseline LVH w/ repol abnl, no sig ischemia, EF 70% no artifact    Past Surgical History  Procedure Laterality Date  . Abdominal hysterectomy  1976    RSO (fibroid)  . Top    . Abdominal adhesion surgery  11/2001    Dr. Tamala Ware  . Esophagogastroduodenoscopy  07/2006    multiple angioectasias  . Doppler echocardiography      EF 55%, mild MR, TR  . Dobutamine stress echo  11/10    normal  . Laryngeal polyp  10/2008    Dr. Tami Ware  . Esophagogastroduodenoscopy Left  03/12/2015    Procedure: place tube in throat and evaluate stomach and duodenum for source of bleeding. Cauterize if concern for rebleeding.;  Surgeon: Kathryn Luster, MD;  Location: [facility] ENDOSCOPY;  Service: Endoscopy;  Laterality: Left;  . Esophagogastroduodenoscopy (egd) with propofol N/A 11/23/2015    Procedure: ESOPHAGOGASTRODUODENOSCOPY (EGD) WITH PROPOFOL;  Surgeon: Kathryn Luster, MD;  Location: [facility] ENDOSCOPY;  Service: Gastroenterology;  Laterality: N/A;    Family History  Problem Relation Age of Onset  . Stroke Father   . Hypertension Mother   . Hypertension      sibling  . Diabetes      grandmother  . Cancer Sister     breast cancer    Social History   Social History  . Marital Status: Widowed     Spouse Name: N/A  . Number of Children: 1  . Years of Education: N/A   Occupational History  . 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 Smoking status: Current Every Day Smoker -- 0.50 packs/day    Types: Cigarettes  . Smokeless tobacco: Never Used  . Alcohol Use: No  . Drug Use: No  . Sexual Activity: Not on file   Other Topics Concern  . Not on file   Social History Narrative   No living will   Requests son Kathryn Ware as health care POA   Would accept resuscitation    Not sure about feeding tubes   Review of Systems Appetite is not great---has lost some weight Sleep is not very good--- affected by noise in ear Wears seat belt Teeth okay--keeps up with dentist Bowels are okay. No blood Voids okay. Some urgency but no incontinence No sig back or joint pain Some restless legs symptoms---uses the clonazepam rarely (1/2)    Objective:   Physical Exam  Constitutional: She is oriented to person, place, and time. She appears well-developed and well-nourished. No distress.  HENT:  Mouth/Throat: Oropharynx is clear and moist. No oropharyngeal exudate.  Full dentures  Neck: Normal range of motion. Neck supple. No thyromegaly present.  Cardiovascular: Normal rate and regular rhythm.  Exam reveals no gallop.   Gr 2/6 aortic systolic murmur to carotids Faint distal pulses  Pulmonary/Chest: Effort normal and breath sounds normal. No respiratory distress. She has no wheezes. She has no rales.  Abdominal: Soft. There is no tenderness.  Musculoskeletal: She exhibits no edema or tenderness.  Lymphadenopathy:    She has no cervical adenopathy.  Neurological: She is alert and oriented to person, place, and time.  President-- "Trump, Obama, Bush" 100-    "can't do numbers" D-l-r-o-w Recall 3/3  Normal sensation to fine touch in feet  Skin: No rash noted. No erythema.  No foot lesions  Psychiatric: She has a normal  mood and affect. Her behavior is normal.          Assessment & Plan:

## 2016-02-23 NOTE — Assessment & Plan Note (Signed)
BP Readings from Last 3 Encounters:  02/23/16 136/66  11/23/15 153/63  10/21/15 148/60   Good control No change needed

## 2016-02-23 NOTE — Assessment & Plan Note (Signed)
Having low sugar reactions---will reduce glipizide

## 2016-02-23 NOTE — Patient Instructions (Signed)
Please take the glipizide only in the morning. Let me know if low sugar reactions continue.

## 2016-02-23 NOTE — Assessment & Plan Note (Signed)
On iron and uses clonazepam at times

## 2016-02-23 NOTE — Assessment & Plan Note (Signed)
No symptoms of sig stenosis Will hold off on testing

## 2016-02-23 NOTE — Assessment & Plan Note (Signed)
I have personally reviewed the Medicare Annual Wellness questionnaire and have noted 1. The patient's medical and social history 2. Their use of alcohol, tobacco or illicit drugs 3. Their current medications and supplements 4. The patient's functional ability including ADL's, fall risks, home safety risks and hearing or visual             impairment. 5. Diet and physical activities 6. Evidence for depression or mood disorders  The patients weight, height, BMI and visual acuity have been recorded in the chart I have made referrals, counseling and provided education to the patient based review of the above and I have provided the pt with a written personalized care plan for preventive services.  I have provided you with a copy of your personalized plan for preventive services. Please take the time to review along with your updated medication list.  Will give Rx for Td Prefers no zostavax due to cost Discussed at least 1 more screening mammogram May be due for colonoscopy later this year

## 2016-02-23 NOTE — Assessment & Plan Note (Signed)
Hopefully anemia is better On iron

## 2016-02-23 NOTE — Progress Notes (Signed)
Pre visit review using our clinic review tool, if applicable. No additional management support is needed unless otherwise documented below in the visit note. 

## 2016-03-02 ENCOUNTER — Encounter: Payer: Self-pay | Admitting: Internal Medicine

## 2016-03-12 ENCOUNTER — Other Ambulatory Visit: Payer: Self-pay | Admitting: Internal Medicine

## 2016-03-17 ENCOUNTER — Other Ambulatory Visit: Payer: Self-pay | Admitting: Internal Medicine

## 2016-04-26 ENCOUNTER — Other Ambulatory Visit: Payer: Self-pay | Admitting: Internal Medicine

## 2016-04-28 ENCOUNTER — Other Ambulatory Visit: Payer: Self-pay | Admitting: Internal Medicine

## 2016-05-30 ENCOUNTER — Other Ambulatory Visit: Payer: Self-pay | Admitting: Internal Medicine

## 2016-07-05 ENCOUNTER — Emergency Department: Payer: Medicare Other

## 2016-07-05 ENCOUNTER — Inpatient Hospital Stay
Admission: EM | Admit: 2016-07-05 | Discharge: 2016-07-08 | DRG: 378 | Disposition: A | Discharge status: Home / Self Care | Payer: Medicare Other | Attending: Internal Medicine | Admitting: Internal Medicine

## 2016-07-05 ENCOUNTER — Encounter: Payer: Self-pay | Admitting: Emergency Medicine

## 2016-07-05 DIAGNOSIS — I10 Essential (primary) hypertension: Secondary | ICD-10-CM | POA: Diagnosis present

## 2016-07-05 DIAGNOSIS — Z7984 Long term (current) use of oral hypoglycemic drugs: Secondary | ICD-10-CM | POA: Diagnosis not present

## 2016-07-05 DIAGNOSIS — K635 Polyp of colon: Secondary | ICD-10-CM | POA: Diagnosis not present

## 2016-07-05 DIAGNOSIS — E785 Hyperlipidemia, unspecified: Secondary | ICD-10-CM | POA: Diagnosis present

## 2016-07-05 DIAGNOSIS — D62 Acute posthemorrhagic anemia: Secondary | ICD-10-CM | POA: Diagnosis present

## 2016-07-05 DIAGNOSIS — Z803 Family history of malignant neoplasm of breast: Secondary | ICD-10-CM | POA: Diagnosis not present

## 2016-07-05 DIAGNOSIS — K648 Other hemorrhoids: Secondary | ICD-10-CM | POA: Diagnosis not present

## 2016-07-05 DIAGNOSIS — Z8249 Family history of ischemic heart disease and other diseases of the circulatory system: Secondary | ICD-10-CM

## 2016-07-05 DIAGNOSIS — D500 Iron deficiency anemia secondary to blood loss (chronic): Secondary | ICD-10-CM | POA: Diagnosis present

## 2016-07-05 DIAGNOSIS — Z823 Family history of stroke: Secondary | ICD-10-CM

## 2016-07-05 DIAGNOSIS — Q2733 Arteriovenous malformation of digestive system vessel: Secondary | ICD-10-CM | POA: Diagnosis not present

## 2016-07-05 DIAGNOSIS — K640 First degree hemorrhoids: Secondary | ICD-10-CM | POA: Diagnosis present

## 2016-07-05 DIAGNOSIS — D122 Benign neoplasm of ascending colon: Secondary | ICD-10-CM | POA: Diagnosis not present

## 2016-07-05 DIAGNOSIS — Z79899 Other long term (current) drug therapy: Secondary | ICD-10-CM | POA: Diagnosis not present

## 2016-07-05 DIAGNOSIS — K31811 Angiodysplasia of stomach and duodenum with bleeding: Secondary | ICD-10-CM | POA: Diagnosis not present

## 2016-07-05 DIAGNOSIS — R42 Dizziness and giddiness: Secondary | ICD-10-CM | POA: Diagnosis not present

## 2016-07-05 DIAGNOSIS — D649 Anemia, unspecified: Secondary | ICD-10-CM

## 2016-07-05 DIAGNOSIS — R531 Weakness: Secondary | ICD-10-CM | POA: Diagnosis not present

## 2016-07-05 DIAGNOSIS — K922 Gastrointestinal hemorrhage, unspecified: Secondary | ICD-10-CM | POA: Diagnosis not present

## 2016-07-05 DIAGNOSIS — Z833 Family history of diabetes mellitus: Secondary | ICD-10-CM | POA: Diagnosis not present

## 2016-07-05 DIAGNOSIS — R05 Cough: Secondary | ICD-10-CM | POA: Diagnosis not present

## 2016-07-05 DIAGNOSIS — K31819 Angiodysplasia of stomach and duodenum without bleeding: Secondary | ICD-10-CM | POA: Diagnosis not present

## 2016-07-05 DIAGNOSIS — E119 Type 2 diabetes mellitus without complications: Secondary | ICD-10-CM | POA: Diagnosis not present

## 2016-07-05 HISTORY — DX: Encounter for other specified aftercare: Z51.89

## 2016-07-05 LAB — BASIC METABOLIC PANEL
Anion gap: 8 (ref 5–15)
BUN: 32 mg/dL — AB (ref 6–20)
CALCIUM: 9.3 mg/dL (ref 8.9–10.3)
CHLORIDE: 107 mmol/L (ref 101–111)
CO2: 21 mmol/L — AB (ref 22–32)
CREATININE: 1.4 mg/dL — AB (ref 0.44–1.00)
GFR calc non Af Amer: 36 mL/min — ABNORMAL LOW (ref >60)
GFR, EST AFRICAN AMERICAN: 42 mL/min — AB (ref >60)
Glucose, Bld: 186 mg/dL — ABNORMAL HIGH (ref 65–99)
Potassium: 4.3 mmol/L (ref 3.5–5.1)
SODIUM: 136 mmol/L (ref 135–145)

## 2016-07-05 LAB — CBC WITH DIFFERENTIAL/PLATELET
BASOS PCT: 0 %
Basophils Absolute: 0 10*3/uL (ref 0–0.1)
EOS ABS: 0 10*3/uL (ref 0–0.7)
EOS PCT: 0 %
HCT: 22.1 % — ABNORMAL LOW (ref 35.0–47.0)
HEMOGLOBIN: 7.3 g/dL — AB (ref 12.0–16.0)
LYMPHS ABS: 1.3 10*3/uL (ref 1.0–3.6)
Lymphocytes Relative: 14 %
MCH: 27 pg (ref 26.0–34.0)
MCHC: 33.1 g/dL (ref 32.0–36.0)
MCV: 81.5 fL (ref 80.0–100.0)
MONO ABS: 0.6 10*3/uL (ref 0.2–0.9)
Monocytes Relative: 7 %
Neutro Abs: 7.1 10*3/uL — ABNORMAL HIGH (ref 1.4–6.5)
Neutrophils Relative %: 79 %
Platelets: 263 10*3/uL (ref 150–440)
RBC: 2.71 MIL/uL — ABNORMAL LOW (ref 3.80–5.20)
RDW: 15.8 % — AB (ref 11.5–14.5)
WBC: 9.1 10*3/uL (ref 3.6–11.0)

## 2016-07-05 LAB — TROPONIN I: TROPONIN I: 0.03 ng/mL — AB (ref <0.03)

## 2016-07-05 LAB — GLUCOSE, CAPILLARY
GLUCOSE-CAPILLARY: 68 mg/dL (ref 65–99)
GLUCOSE-CAPILLARY: 214 mg/dL — AB (ref 65–99)
GLUCOSE-CAPILLARY: 91 mg/dL (ref 65–99)
Glucose-Capillary: 167 mg/dL — ABNORMAL HIGH (ref 65–99)

## 2016-07-05 LAB — URINALYSIS COMPLETE WITH MICROSCOPIC (ARMC ONLY)
Bacteria, UA: NONE SEEN
Bilirubin Urine: NEGATIVE
Glucose, UA: NEGATIVE mg/dL
Hgb urine dipstick: NEGATIVE
Ketones, ur: NEGATIVE mg/dL
Leukocytes, UA: NEGATIVE
Nitrite: NEGATIVE
PH: 7 (ref 5.0–8.0)
PROTEIN: NEGATIVE mg/dL
Specific Gravity, Urine: 1.015 (ref 1.005–1.030)

## 2016-07-05 LAB — PREPARE RBC (CROSSMATCH)

## 2016-07-05 MED ORDER — ONDANSETRON HCL 4 MG/2ML IJ SOLN: 4.0000 mg | Freq: Four times a day (QID) | INTRAMUSCULAR | Status: DC | PRN

## 2016-07-05 MED ORDER — HYDROCHLOROTHIAZIDE 25 MG PO TABS
25.0000 mg | ORAL_TABLET | Freq: Every day | ORAL | Status: DC
  Administered 2016-07-06 – 2016-07-08 (×3): 25 mg via ORAL
  Filled 2016-07-05 (×3): qty 1

## 2016-07-05 MED ORDER — LISINOPRIL 20 MG PO TABS
40.0000 mg | ORAL_TABLET | Freq: Every day | ORAL | Status: DC
  Administered 2016-07-06 – 2016-07-08 (×3): 40 mg via ORAL
  Filled 2016-07-05 (×3): qty 2

## 2016-07-05 MED ORDER — SODIUM CHLORIDE 0.9 % IV BOLUS (SEPSIS)
1000.0000 mL | Freq: Once | INTRAVENOUS | Status: AC
  Administered 2016-07-05: 1000 mL via INTRAVENOUS

## 2016-07-05 MED ORDER — VITAMIN B-12 1000 MCG PO TABS
1000.0000 ug | ORAL_TABLET | Freq: Every day | ORAL | Status: DC
  Administered 2016-07-06 – 2016-07-08 (×3): 1000 ug via ORAL
  Filled 2016-07-05 (×3): qty 1

## 2016-07-05 MED ORDER — SODIUM CHLORIDE 0.9 % IV SOLN
INTRAVENOUS | Status: DC
  Administered 2016-07-05: 15:00:00 via INTRAVENOUS

## 2016-07-05 MED ORDER — GLIPIZIDE ER 5 MG PO TB24: 10.0000 mg | ORAL_TABLET | Freq: Every day | ORAL | Status: DC

## 2016-07-05 MED ORDER — INSULIN ASPART 100 UNIT/ML ~~LOC~~ SOLN: 0.0000 [IU] | Freq: Three times a day (TID) | SUBCUTANEOUS | Status: DC

## 2016-07-05 MED ORDER — LISINOPRIL-HYDROCHLOROTHIAZIDE 20-12.5 MG PO TABS: 2.0000 | ORAL_TABLET | Freq: Every day | ORAL | Status: DC

## 2016-07-05 MED ORDER — ACETAMINOPHEN 650 MG RE SUPP: 650.0000 mg | Freq: Four times a day (QID) | RECTAL | Status: DC | PRN

## 2016-07-05 MED ORDER — SENNOSIDES-DOCUSATE SODIUM 8.6-50 MG PO TABS: 1.0000 | ORAL_TABLET | Freq: Every evening | ORAL | Status: DC | PRN

## 2016-07-05 MED ORDER — ONDANSETRON HCL 4 MG PO TABS: 4.0000 mg | ORAL_TABLET | Freq: Four times a day (QID) | ORAL | Status: DC | PRN

## 2016-07-05 MED ORDER — ACETAMINOPHEN 325 MG PO TABS: 650.0000 mg | ORAL_TABLET | Freq: Four times a day (QID) | ORAL | Status: DC | PRN

## 2016-07-05 MED ORDER — DOCUSATE SODIUM 100 MG PO CAPS
100.0000 mg | ORAL_CAPSULE | Freq: Two times a day (BID) | ORAL | Status: DC
  Administered 2016-07-07: 09:00:00 100 mg via ORAL
  Filled 2016-07-05 (×3): qty 1

## 2016-07-05 MED ORDER — ATORVASTATIN CALCIUM 20 MG PO TABS
40.0000 mg | ORAL_TABLET | Freq: Every day | ORAL | Status: DC
  Administered 2016-07-05 – 2016-07-06 (×2): 40 mg via ORAL
  Filled 2016-07-05 (×2): qty 2

## 2016-07-05 MED ORDER — INSULIN ASPART 100 UNIT/ML ~~LOC~~ SOLN
0.0000 [IU] | Freq: Three times a day (TID) | SUBCUTANEOUS | Status: DC
  Administered 2016-07-05 – 2016-07-06 (×2): 3 [IU] via SUBCUTANEOUS
  Administered 2016-07-07: 5 [IU] via SUBCUTANEOUS
  Filled 2016-07-05: qty 3
  Filled 2016-07-05: qty 5
  Filled 2016-07-05: qty 3

## 2016-07-05 MED ORDER — FERROUS SULFATE 325 (65 FE) MG PO TABS
325.0000 mg | ORAL_TABLET | Freq: Every day | ORAL | Status: DC
  Administered 2016-07-06: 12:00:00 325 mg via ORAL
  Filled 2016-07-05: qty 1

## 2016-07-05 MED ORDER — SODIUM CHLORIDE 0.9 % IV SOLN
Freq: Once | INTRAVENOUS | Status: AC
  Administered 2016-07-05: 14:00:00 via INTRAVENOUS

## 2016-07-05 MED ORDER — SUCRALFATE 1 G PO TABS
1.0000 g | ORAL_TABLET | Freq: Two times a day (BID) | ORAL | Status: DC
  Administered 2016-07-05 – 2016-07-06 (×2): 1 g via ORAL
  Filled 2016-07-05 (×2): qty 1

## 2016-07-05 MED ORDER — NICOTINE 21 MG/24HR TD PT24
21.0000 mg | MEDICATED_PATCH | Freq: Every day | TRANSDERMAL | Status: DC
  Administered 2016-07-05 – 2016-07-08 (×4): 21 mg via TRANSDERMAL
  Filled 2016-07-05 (×4): qty 1

## 2016-07-05 MED ORDER — PANTOPRAZOLE SODIUM 40 MG PO TBEC
40.0000 mg | DELAYED_RELEASE_TABLET | Freq: Every day | ORAL | Status: DC
  Administered 2016-07-05 – 2016-07-08 (×4): 40 mg via ORAL
  Filled 2016-07-05 (×4): qty 1

## 2016-07-05 NOTE — Progress Notes (Signed)
PT Cancellation Note  Patient Details Name: Kathryn Ware MRN: [id number] DOB: 1941/09/08   Cancelled Treatment:    Reason Eval/Treat Not Completed: Other (comment) (Pt on hold from PT; currently receiving blood transfusion. PT will return to complete evaluation tomorrow as able.)   Riley Nearing, SPT 07/05/2016, 3:55 PM

## 2016-07-05 NOTE — ED Provider Notes (Addendum)
[facility] Emergency Department Provider Note   ____________________________________________   First MD Initiated Contact with Patient 07/05/16 [phone]     (approximate)  I have reviewed the triage vital signs and the nursing notes.   HISTORY  Chief Complaint Dizziness   HPI Kathryn Ware is a 75 y.o. female with a history of AVM with gastrointestinal bleeding and anemia requiring transfusion was presenting with 3 days of lightheadedness. She says that she also feels a pounding, 3 out of 10 frontal headache. She says this is similar to the feeling that she was experiencing the last time she was anemic and requiring transfusion. She also says that her stools have darkened. She denies any abdominal pain. She says that she has been compliant with her iron regimen at home. She is not on any blood thinners.She says that when making coffee this morning she felt like she was going to pass out. This is why she called ambulance to come to the hospital. The feeling of passing out was one of lightheadedness. She denies any chest pain. She denies any shortness of breath. However, she just that she has had a cough productive of "cream colored sputum" over the past week. She denies any fever, sick contacts Past Medical History:  Diagnosis Date  . Diabetes mellitus type II   . GI AVM (gastrointestinal arteriovenous vascular malformation)    stomach  . History of echocardiogram 3/15   a. 12/2013: EF 60-65%, DD, mild LVH, nl RV size & systolic function, mild MR/TR, mild AS, nl RVSP  . History of nuclear stress test    a. 12/2013: low risk, no sig WMA, nondiag EKG 2/2 baseline LVH w/ repol abnl, no sig ischemia, EF 70% no artifact  . HLD (hyperlipidemia)   . HTN (hypertension)   . RLS (restless legs syndrome)     Patient Active Problem List   Diagnosis Date Noted  . Aortic sclerosis ([place]) 02/23/2016  . Restless legs syndrome (RLS) 02/23/2016  . Duodenal arteriovenous  malformation 03/19/2015  . Acute posthemorrhagic anemia 03/10/2015  . Anemia due to gastrointestinal blood loss 02/18/2015  . Advance directive discussed with patient 02/18/2015  . Nicotine dependence 10/29/2013  . Routine general medical examination at a health care facility 08/17/2011  . Diabetes mellitus type 2, controlled, without complications ([place]) 23/76/2831  . HYPERCHOLESTEROLEMIA 02/02/2007  . Essential hypertension, benign 02/02/2007  . Angiodysplasia of intestinal tract 02/02/2007    Past Surgical History:  Procedure Laterality Date  . ABDOMINAL ADHESION SURGERY  11/2001   Dr. Tamala Julian  . ABDOMINAL HYSTERECTOMY  1976   RSO (fibroid)  . DOBUTAMINE STRESS ECHO  11/10   normal  . DOPPLER ECHOCARDIOGRAPHY     EF 55%, mild MR, TR  . ESOPHAGOGASTRODUODENOSCOPY  07/2006   multiple angioectasias  . ESOPHAGOGASTRODUODENOSCOPY Left 03/12/2015   Procedure: place tube in throat and evaluate stomach and duodenum for source of bleeding. Cauterize if concern for rebleeding.;  Surgeon: Hulen Luster, MD;  Location: [facility] ENDOSCOPY;  Service: Endoscopy;  Laterality: Left;  . ESOPHAGOGASTRODUODENOSCOPY (EGD) WITH PROPOFOL N/A 11/23/2015   Procedure: ESOPHAGOGASTRODUODENOSCOPY (EGD) WITH PROPOFOL;  Surgeon: Hulen Luster, MD;  Location: [facility] ENDOSCOPY;  Service: Gastroenterology;  Laterality: N/A;  . laryngeal polyp  10/2008   Dr. Tami Ribas  . TOP      Prior to Admission medications   Medication Sig Start Date End Date Taking? Authorizing Provider  clonazePAM (KLONOPIN) 0.5 MG tablet Take 0.5 mg by mouth at bedtime.  Historical Provider, MD  ferrous sulfate 325 (65 FE) MG tablet Take 325 mg by mouth daily with breakfast.      Historical Provider, MD  glipiZIDE (GLUCOTROL XL) 10 MG 24 hr tablet Take 1 tablet (10 mg total) by mouth daily with breakfast. 02/23/16   Venia Carbon, MD  lisinopril-hydrochlorothiazide (PRINZIDE,ZESTORETIC) 20-12.5 MG tablet TAKE 2 TABLETS BY MOUTH DAILY 03/17/16   Venia Carbon, MD  metFORMIN (GLUCOPHAGE-XR) 500 MG 24 hr tablet TAKE 1 TABLET BY MOUTH TWICE DAILY 10/29/15   Venia Carbon, MD  omeprazole (PRILOSEC) 40 MG capsule TAKE 1 CAPSULE(40 MG) BY MOUTH TWICE DAILY BEFORE A MEAL 04/28/16   Venia Carbon, MD  ONE [facility] ULTRA TEST test strip USE AS DIRECTED ONCE DAILY 05/31/16   Venia Carbon, MD  simvastatin (ZOCOR) 80 MG tablet TAKE 1 TABLET(80 MG) BY MOUTH DAILY 04/27/16   Venia Carbon, MD  sucralfate (CARAFATE) 1 G tablet TAKE 1 TABLET BY MOUTH BEFORE MEALS AND EVERY NIGHT AT BEDTIME 05/18/15   Venia Carbon, MD  tetanus & diphtheria toxoids, adult, (TENIVAC) 5-2 LFU injection Inject 0.5 mLs into the muscle once. 02/23/16   Venia Carbon, MD  vitamin B-12 (CYANOCOBALAMIN) 1000 MCG tablet Take 1,000 mcg by mouth daily.    Historical Provider, MD    Allergies Metformin and Nifedipine  Family History  Problem Relation Age of Onset  . Stroke Father   . Cancer Sister     breast cancer  . Hypertension Mother   . Hypertension      sibling  . Diabetes      grandmother    Social History Social History  Substance Use Topics  . Smoking status: Current Every Day Smoker    Packs/day: 0.50    Types: Cigarettes  . Smokeless tobacco: Never Used  . Alcohol use No    Review of Systems Constitutional: No fever/chills Eyes: No visual changes. ENT: No sore throat. Cardiovascular: Denies chest pain. Respiratory: As above Gastrointestinal: No abdominal pain.  No nausea, no vomiting.  No diarrhea.  No constipation. Genitourinary: Negative for dysuria. Musculoskeletal: Negative for back pain. Skin: Negative for rash. Neurological: Negative for headaches, focal weakness or numbness.  10-point ROS otherwise negative.  ____________________________________________   PHYSICAL EXAM:  VITAL SIGNS: ED Triage Vitals [07/05/16 0804]  Enc Vitals Group     BP (!) 141/79     Pulse Rate 98     Resp 17     Temp 97.6 F (36.4 C)     Temp Source  Oral     SpO2 100 %     Weight 125 lb (56.7 kg)     Height '5\' 5"'$  (1.651 m)     Head Circumference      Peak Flow      Pain Score 4     Pain Loc      Pain Edu?      Excl. in [place]?     Constitutional: Alert and oriented. Well appearing and in no acute distress. Eyes: Conjunctivae are normal. PERRL. EOMI. Head: Atraumatic. Nose: No congestion/rhinnorhea. Mouth/Throat: Mucous membranes are moist.   Neck: No stridor.   Cardiovascular: Normal rate, regular rhythm. Grossly normal heart sounds.  Respiratory: Normal respiratory effort.  No retractions. Lungs CTAB. Gastrointestinal: Soft and nontender. No distention. Rectal exam with black stool which is strongly heme positive. Nurse, Mateo Flow, present for the exam.   Musculoskeletal: No lower extremity tenderness nor edema.  No joint effusions.  Neurologic:  Normal speech and language. No gross focal neurologic deficits are appreciated.  Skin:  Skin is warm, dry and intact. No rash noted. Psychiatric: Mood and affect are normal. Speech and behavior are normal.  ____________________________________________   LABS (all labs ordered are listed, but only abnormal results are displayed)  Labs Reviewed  CBC WITH DIFFERENTIAL/PLATELET - Abnormal; Notable for the following:       Result Value   RBC 2.71 (*)    Hemoglobin 7.3 (*)    HCT 22.1 (*)    RDW 15.8 (*)    Neutro Abs 7.1 (*)    All other components within normal limits  BASIC METABOLIC PANEL - Abnormal; Notable for the following:    CO2 21 (*)    Glucose, Bld 186 (*)    BUN 32 (*)    Creatinine, Ser 1.40 (*)    GFR calc non Af Amer 36 (*)    GFR calc Af Amer 42 (*)    All other components within normal limits  TROPONIN I - Abnormal; Notable for the following:    Troponin I 0.03 (*)    All other components within normal limits  URINALYSIS COMPLETEWITH MICROSCOPIC ([facility] ONLY)  TYPE AND SCREEN   ____________________________________________  EKG  ED ECG REPORT I, Doran Stabler, the attending physician, personally viewed and interpreted this ECG.   Date: 07/05/2016  EKG Time: 0803  Rate: 103  Rhythm: Sinus tachycardia  Axis: Normal axis  Intervals:none  ST&T Change: LVH. T-wave inversions in V5 and V6. No ST elevations or depressions. No significant change from EKG of 03/10/2015. ____________________________________________  RADIOLOGY  DG Chest 1 View (Accession [id number]) (Order [id number])  Imaging  Date: 07/05/2016 Department: [facility] EMERGENCY DEPARTMENT Released By/Authorizing: Orbie Pyo, MD (auto-released)  PACS Images   Show images for DG Chest 1 View  Study Result   CLINICAL DATA:  Weakness, dizziness for 2 days, cough and congestion  EXAM: CHEST 1 VIEW  COMPARISON:  Chest x-ray of 03/10/2015  FINDINGS: The lungs remain clear and somewhat hyperaerated. Mediastinal and hilar contours are unremarkable. The heart is within normal limits in size. No bony abnormality is seen.  IMPRESSION: No active lung disease.  Slight hyper aeration.   Electronically Signed   By: Ivar Drape M.D.   On: 07/05/2016 08:52     ____________________________________________   PROCEDURES  Procedure(s) performed:     Procedures  Critical Care performed:  CRITICAL CARE Performed by: Doran Stabler   Total critical care time: 35 minutes  Critical care time was exclusive of separately billable procedures and treating other patients.  Critical care was necessary to treat or prevent imminent or life-threatening deterioration.  Critical care was time spent personally by me on the following activities: development of treatment plan with patient and/or surrogate as well as nursing, discussions with consultants, evaluation of patient's response to treatment, examination of patient, obtaining history from patient or surrogate, ordering and performing treatments and interventions, ordering and  review of laboratory studies, ordering and review of radiographic studies, pulse oximetry and re-evaluation of patient's condition.  ____________________________________________   INITIAL IMPRESSION / ASSESSMENT AND PLAN / ED COURSE  Pertinent labs & imaging results that were available during my care of the patient were reviewed by me and considered in my medical decision making (see chart for details).  ----------------------------------------- 9:28 AM on 07/05/2016 -----------------------------------------  Patient with a hemoglobin of 7.3. Appears to have a symptomatic anemia likely  related to a gastrointestinal bleed. We do not have GI on call here today. I discussed the case the hospitalist, Dr. Tressia Miners, who recommends transfer. I have called out to [facility] for the patient's transfer. I also discussed the diagnosis and need for transfer with the patient. She is understanding and will comply.  Clinical Course   ----------------------------------------- 10:36 AM on 07/05/2016 -----------------------------------------  Patient without active hemorrhaging in the emergency department. Discussed the case with Dr. Marily Memos at [facility] was accepted the patient on the medicine service. He says that he will discuss the case with gastrology at [facility]. Patient aware of need for transfer and is understanding and willing to comply.  ____________________________________________   FINAL CLINICAL IMPRESSION(S) / ED DIAGNOSES  Symptomatic anemia. GI bleeding.    NEW MEDICATIONS STARTED DURING THIS VISIT:  New Prescriptions   No medications on file     Note:  This document was prepared using Dragon voice recognition software and may include unintentional dictation errors.    Orbie Pyo, MD 07/05/16 1037  Received a call from Dr. Tressia Miners who received a call from the [facility] hospitalist, Dr. Marily Memos.  Dr. Marily Memos discussed the case with gastroenterology at [facility] and they stated they do not think the patient needs to be transferred because they do not foresee a procedure happening with gastroenterology unless the patient acutely decompensates at which point they stated a greasy take the patient as an emergent transfer. Dr. Tressia Miners asked me to contact the hospitalist on-call. I discussed the case Dr. Posey Pronto, hospitalist on call, who then discussed the case with Dr. Tressia Miners and agrees to accept the patient here at [facility]. The patient is understanding the plan and is willing to comply.   Orbie Pyo, MD 07/05/16 1128

## 2016-07-05 NOTE — H&P (Signed)
[place] [place] [facility] [place] NAME: Kathryn Ware    MR#:  [id number]  DATE OF BIRTH:  [birth year]  DATE OF ADMISSION:  07/05/2016  PRIMARY CARE PHYSICIAN: Viviana Simpler, MD   REQUESTING/REFERRING PHYSICIAN: Dr Clearnce Hasten  CHIEF COMPLAINT:  Weakness dizziness and seeing black spots in her eyes. Patient was found to be anemic.  HISTORY OF PRESENT ILLNESS:  Kathryn Ware  is a 75 y.o. female with a known history of Chronic anemia due to slow GI bleed secondary to AVM who had undergone last EGD in January 2017 with argon laser treatment for AVM-gastric, hypertension, hyperlipidemia comes through the emergency room after she started having dizziness for an weakness for last few days started noticing some black spots. Patient has these symptoms when she is anemic to the emergency room and found to have Hgb of 7.3 She has dark stools, she is on iron pills and eats quite a bit of green leafy vegetables. Denies any abdominal pain shortness of breath or chest pain. She is being admitted for further evaluation management. ER physician spoke with Dr. Jason Coop GI at [facility] who recommended no indication for transfer at present since patient is hemodynamically stable and not actively bleeding. Discussed with patient risk and benefits of transfusion she is agreeable to it. Will admit patient for acute on chronic GI bleed possibly due to AVMs  PAST MEDICAL HISTORY:   Past Medical History:  Diagnosis Date  . Blood transfusion without reported diagnosis   . Diabetes mellitus type II   . GI AVM (gastrointestinal arteriovenous vascular malformation)    stomach  . History of echocardiogram 3/15   a. 12/2013: EF 60-65%, DD, mild LVH, nl RV size & systolic function, mild MR/TR, mild AS, nl RVSP  . History of nuclear stress test    a. 12/2013: low risk, no sig WMA, nondiag EKG 2/2 baseline LVH w/ repol abnl, no sig ischemia, EF 70% no artifact  . HLD (hyperlipidemia)    . HTN (hypertension)   . RLS (restless legs syndrome)     PAST SURGICAL HISTOIRY:   Past Surgical History:  Procedure Laterality Date  . ABDOMINAL ADHESION SURGERY  11/2001   Dr. Tamala Julian  . ABDOMINAL HYSTERECTOMY  1976   RSO (fibroid)  . DOBUTAMINE STRESS ECHO  11/10   normal  . DOPPLER ECHOCARDIOGRAPHY     EF 55%, mild MR, TR  . ESOPHAGOGASTRODUODENOSCOPY  07/2006   multiple angioectasias  . ESOPHAGOGASTRODUODENOSCOPY Left 03/12/2015   Procedure: place tube in throat and evaluate stomach and duodenum for source of bleeding. Cauterize if concern for rebleeding.;  Surgeon: Hulen Luster, MD;  Location[facility] ENDOSCOPY;  Service: Endoscopy;  Laterality: Left;  . ESOPHAGOGASTRODUODENOSCOPY (EGD) WITH PROPOFOL N/A 11/23/2015   Procedure: ESOPHAGOGASTRODUODENOSCOPY (EGD) WITH PROPOFOL;  Surgeon: Hulen Luster, MD;  Location: [facility] ENDOSCOPY;  Service: Gastroenterology;  Laterality: N/A;  . laryngeal polyp  10/2008   Dr. Tami Ribas  . TOP      SOCIAL HISTORY:   Social History  Substance Use Topics  . Smoking status: Current Every Day Smoker    Packs/day: 0.50    Types: Cigarettes  . Smokeless tobacco: Never Used  . Alcohol use No    FAMILY HISTORY:   Family History  Problem Relation Age of Onset  . Stroke Father   . Cancer Sister     breast cancer  . Hypertension Mother   . Hypertension      sibling  .  Diabetes      grandmother    DRUG ALLERGIES:   Allergies  Allergen Reactions  . Metformin     REACTION: diarrhea, patient aware that she is onmetformin when it is listed that she is allergic  . Nifedipine     REACTION: cough    REVIEW OF SYSTEMS:  Review of Systems  Constitutional: Negative for chills, fever and weight loss.  HENT: Negative for ear discharge, ear pain and nosebleeds.   Eyes: Negative for blurred vision, pain and discharge.  Respiratory: Negative for sputum production, shortness of breath, wheezing and stridor.   Cardiovascular: Negative for chest pain,  palpitations, orthopnea and PND.  Gastrointestinal: Positive for melena. Negative for abdominal pain, diarrhea, nausea and vomiting.  Genitourinary: Negative for frequency and urgency.  Musculoskeletal: Negative for back pain and joint pain.  Neurological: Positive for dizziness and weakness. Negative for sensory change, speech change and focal weakness.  Psychiatric/Behavioral: Negative for depression and hallucinations. The patient is not nervous/anxious.      MEDICATIONS AT HOME:   Prior to Admission medications   Medication Sig Start Date End Date Taking? Authorizing Provider  ferrous sulfate 325 (65 FE) MG tablet Take 325 mg by mouth daily with breakfast.     Yes Historical Provider, MD  glipiZIDE (GLUCOTROL XL) 10 MG 24 hr tablet Take 1 tablet (10 mg total) by mouth daily with breakfast. 02/23/16  Yes Venia Carbon, MD  lisinopril-hydrochlorothiazide (PRINZIDE,ZESTORETIC) 20-12.5 MG tablet TAKE 2 TABLETS BY MOUTH DAILY 03/17/16  Yes Venia Carbon, MD  metFORMIN (GLUCOPHAGE-XR) 500 MG 24 hr tablet Take 500 mg by mouth daily with breakfast.   Yes Historical Provider, MD  omeprazole (PRILOSEC) 40 MG capsule TAKE 1 CAPSULE(40 MG) BY MOUTH TWICE DAILY BEFORE A MEAL 04/28/16  Yes Venia Carbon, MD  simvastatin (ZOCOR) 80 MG tablet TAKE 1 TABLET(80 MG) BY MOUTH DAILY 04/27/16  Yes Venia Carbon, MD  sucralfate (CARAFATE) 1 G tablet TAKE 1 TABLET BY MOUTH BEFORE MEALS AND EVERY NIGHT AT BEDTIME 05/18/15  Yes Venia Carbon, MD  vitamin B-12 (CYANOCOBALAMIN) 1000 MCG tablet Take 1,000 mcg by mouth daily.   Yes Historical Provider, MD  clonazePAM (KLONOPIN) 0.5 MG tablet Take 0.5 mg by mouth at bedtime.    Historical Provider, MD  ONE TOUCH ULTRA TEST test strip USE AS DIRECTED ONCE DAILY 05/31/16   Venia Carbon, MD  tetanus & diphtheria toxoids, adult, (TENIVAC) 5-2 LFU injection Inject 0.5 mLs into the muscle once. 02/23/16   Venia Carbon, MD      VITAL SIGNS:  Blood pressure (!)  162/74, pulse 74, temperature 97.6 F (36.4 C), temperature source Oral, resp. rate (!) 23, height '5\' 5"'$  (1.651 m), weight 56.7 kg (125 lb), SpO2 100 %.  PHYSICAL EXAMINATION:  GENERAL:  75 y.o.-year-old patient lying in the bed with no acute distress.  EYES: Pupils equal, round, reactive to light and accommodation. No scleral icterus. Extraocular muscles intact. Pallor+ HEENT: Head atraumatic, normocephalic. Oropharynx and nasopharynx clear.  NECK:  Supple, no jugular venous distention. No thyroid enlargement, no tenderness.  LUNGS: Normal breath sounds bilaterally, no wheezing, rales,rhonchi or crepitation. No use of accessory muscles of respiration.  CARDIOVASCULAR: S1, S2 normal. No murmurs, rubs, or gallops.  ABDOMEN: Soft, nontender, nondistended. Bowel sounds present. No organomegaly or mass.  EXTREMITIES: No pedal edema, cyanosis, or clubbing.  NEUROLOGIC: Cranial nerves II through XII are intact. Muscle strength 5/5 in all extremities. Sensation intact. Gait not checked.  PSYCHIATRIC: The patient is alert and oriented x 3.  SKIN: No obvious rash, lesion, or ulcer.   LABORATORY PANEL:   CBC  Recent Labs Lab 07/05/16 0808  WBC 9.1  HGB 7.3*  HCT 22.1*  PLT 263   ------------------------------------------------------------------------------------------------------------------  Chemistries   Recent Labs Lab 07/05/16 0808  NA 136  K 4.3  CL 107  CO2 21*  GLUCOSE 186*  BUN 32*  CREATININE 1.40*  CALCIUM 9.3   ------------------------------------------------------------------------------------------------------------------  Cardiac Enzymes  Recent Labs Lab 07/05/16 0808  TROPONINI 0.03*   ------------------------------------------------------------------------------------------------------------------  RADIOLOGY:  Dg Chest 1 View  Result Date: 07/05/2016 CLINICAL DATA:  Weakness, dizziness for 2 days, cough and congestion EXAM: CHEST 1 VIEW COMPARISON:   Chest x-ray of 03/10/2015 FINDINGS: The lungs remain clear and somewhat hyperaerated. Mediastinal and hilar contours are unremarkable. The heart is within normal limits in size. No bony abnormality is seen. IMPRESSION: No active lung disease.  Slight hyper aeration. Electronically Signed   By: Ivar Drape M.D.   On: 07/05/2016 08:52    EKG:    IMPRESSION AND PLAN:    Kathryn Ware  is a 75 y.o. female with a known history of Chronic anemia due to slow GI bleed secondary to AVM who had undergone last EGD in January 2017 with argon laser treatment for AVM-gastric, hypertension, hyperlipidemia comes through the emergency room after she started having dizziness for an weakness for last few days started noticing some black spots. Patient has these symptoms when she is anemic to the emergency room pump inhibitor 7.3  1. Symptom symptomatically anemia acute on chronic -Presents to the emergency room with weakness dizziness and black spots in the eye and noted to have hemoglobin of 7.3 -Admit to medical floor -Transfuse 1 unit of blood transfusion -Soft diet -GI consultation tomorrow with Dr. Vira Agar -[facility] aware of patient being admitted here they did not recommend transfer at this point  2. Chronic anemia secondary to AV malformation in her stomach and duodenam last EGD done by Dr. Candace Cruise in January 2017 with argon laser treatment -Continue iron pills -Transfuse as needed after current one unit of blood transfusion -Avoid NSAIDs  3. Diabetes type 2 without complication -Sliding-scale insulin -Resume home meds at discharge  4. Hyperlipidemia continuous statins  5. Hypertension continue home meds   6. DVT prophylaxis SCD and teds   All the records are reviewed and case discussed with ED provider. Management plans discussed with the patient, family and they are in agreement.  CODE STATUS: FullAL TIME TAKING CARE OF THIS PATIENT: 45es.    Kathryn Ware M.D on 07/05/2016 at 12:16  PM  Between 7am to 6pm - Pager - [phone]  After 6pm go to [email or web address] - password [email or web address] [facility]  Office  [phone]  CC: Primary care physician; Viviana Simpler, MD

## 2016-07-05 NOTE — ED Notes (Signed)
Pt aware of need for urine specimen. 

## 2016-07-05 NOTE — ED Notes (Signed)
Dr Clearnce Hasten notified troponin 0.03

## 2016-07-05 NOTE — ED Triage Notes (Signed)
Pt has been weak and dizzy for 2 days. Has also had cough and congestion. Hx of transfusion r/t anemia

## 2016-07-05 NOTE — ED Notes (Signed)
Assisted pt to bathroom. Missed hat. Will obtain UA next time.

## 2016-07-06 LAB — CBC
HCT: 26.5 % — ABNORMAL LOW (ref 35.0–47.0)
Hemoglobin: 9 g/dL — ABNORMAL LOW (ref 12.0–16.0)
MCH: 28.3 pg (ref 26.0–34.0)
MCHC: 34.1 g/dL (ref 32.0–36.0)
MCV: 82.9 fL (ref 80.0–100.0)
PLATELETS: 221 10*3/uL (ref 150–440)
RBC: 3.2 MIL/uL — ABNORMAL LOW (ref 3.80–5.20)
RDW: 16.2 % — AB (ref 11.5–14.5)
WBC: 7.3 10*3/uL (ref 3.6–11.0)

## 2016-07-06 LAB — COMPREHENSIVE METABOLIC PANEL
ALK PHOS: 36 U/L — AB (ref 38–126)
ALT: 11 U/L — AB (ref 14–54)
AST: 19 U/L (ref 15–41)
Albumin: 3.5 g/dL (ref 3.5–5.0)
Anion gap: 5 (ref 5–15)
BUN: 16 mg/dL (ref 6–20)
CALCIUM: 9.1 mg/dL (ref 8.9–10.3)
CHLORIDE: 113 mmol/L — AB (ref 101–111)
CO2: 22 mmol/L (ref 22–32)
CREATININE: 1.1 mg/dL — AB (ref 0.44–1.00)
GFR, EST AFRICAN AMERICAN: 56 mL/min — AB (ref >60)
GFR, EST NON AFRICAN AMERICAN: 48 mL/min — AB (ref >60)
Glucose, Bld: 65 mg/dL (ref 65–99)
Potassium: 4.2 mmol/L (ref 3.5–5.1)
Sodium: 140 mmol/L (ref 135–145)
Total Bilirubin: 0.4 mg/dL (ref 0.3–1.2)
Total Protein: 6.6 g/dL (ref 6.5–8.1)

## 2016-07-06 LAB — TYPE AND SCREEN
ABO/RH(D): B POS
ANTIBODY SCREEN: NEGATIVE
Unit division: 0

## 2016-07-06 LAB — GLUCOSE, CAPILLARY
GLUCOSE-CAPILLARY: 136 mg/dL — AB (ref 65–99)
GLUCOSE-CAPILLARY: 85 mg/dL (ref 65–99)
GLUCOSE-CAPILLARY: 109 mg/dL — AB (ref 65–99)
GLUCOSE-CAPILLARY: 236 mg/dL — AB (ref 65–99)
GLUCOSE-CAPILLARY: 91 mg/dL (ref 65–99)
Glucose-Capillary: 62 mg/dL — ABNORMAL LOW (ref 65–99)

## 2016-07-06 MED ORDER — POLYETHYLENE GLYCOL 3350 17 GM/SCOOP PO POWD
1.0000 | Freq: Once | ORAL | Status: AC
  Administered 2016-07-07: 06:00:00 255 g via ORAL

## 2016-07-06 MED ORDER — SUCRALFATE 1 G PO TABS
1.0000 g | ORAL_TABLET | Freq: Three times a day (TID) | ORAL | Status: DC
  Administered 2016-07-06 – 2016-07-08 (×5): 1 g via ORAL
  Filled 2016-07-06 (×5): qty 1

## 2016-07-06 MED ORDER — DEXTROSE 50 % IV SOLN
25.0000 mL | Freq: Once | INTRAVENOUS | Status: AC
  Administered 2016-07-06: 25 mL via INTRAVENOUS

## 2016-07-06 MED ORDER — DEXTROSE 50 % IV SOLN
INTRAVENOUS | Status: AC
  Administered 2016-07-06: 25 mL
  Filled 2016-07-06: qty 50

## 2016-07-06 MED ORDER — DEXTROSE-NACL 5-0.45 % IV SOLN
INTRAVENOUS | Status: DC
  Administered 2016-07-06 – 2016-07-07 (×3): via INTRAVENOUS

## 2016-07-06 MED ORDER — PEG 3350-KCL-NA BICARB-NACL 420 G PO SOLR
4000.0000 mL | Freq: Once | ORAL | Status: DC
  Filled 2016-07-06 (×3): qty 4000

## 2016-07-06 MED ORDER — BISACODYL 5 MG PO TBEC
20.0000 mg | DELAYED_RELEASE_TABLET | Freq: Once | ORAL | Status: AC
  Administered 2016-07-06: 20 mg via ORAL
  Filled 2016-07-06: qty 4

## 2016-07-06 MED ORDER — SODIUM CHLORIDE 0.9 % IV SOLN
INTRAVENOUS | Status: DC
  Administered 2016-07-07: 12:00:00 via INTRAVENOUS

## 2016-07-06 MED ORDER — POLYETHYLENE GLYCOL 3350 17 GM/SCOOP PO POWD
1.0000 | Freq: Once | ORAL | Status: DC
  Filled 2016-07-06 (×2): qty 255

## 2016-07-06 NOTE — Progress Notes (Signed)
Inpatient Diabetes Program Recommendations  AACE/ADA: New Consensus Statement on Inpatient Glycemic Control (2015)  Target Ranges:  Prepandial:   less than 140 mg/dL      Peak postprandial:   less than 180 mg/dL (1-2 hours)      Critically ill patients:  140 - 180 mg/dL   Results for Kathryn Ware, Kathryn Ware (MRN [id number]) as of 07/06/2016 09:17  Ref. Range 07/05/2016 13:33 07/05/2016 14:38 07/05/2016 16:38 07/05/2016 21:20 07/06/2016 06:05 07/06/2016 06:43 07/06/2016 07:55  Glucose-Capillary Latest Ref Range: 65 - 99 mg/dL 68 167 (H) 214 (H) 91 62 (L) 136 (H) 85   Review of Glycemic Control  Diabetes history: DM2 Outpatient Diabetes medications: Metformin 500 mg QAM, Glipizide XL 10 mg QAM Current orders for Inpatient glycemic control: Glipizide XL 10 mg QAM, Novolog 0-9 units TID with meals  Inpatient Diabetes Program Recommendations: Oral Agents: Glucose 68 mg/dl at 13:33 on 07/05/16, 65 mg/dl at 4:44 am 07/06/16, and 62 mg/dl at 6:05 am today. While inpatient, please consider discontinuing Glipizide XL.  Thanks, Barnie Alderman, RN, MSN, CDE Diabetes Coordinator Inpatient Diabetes Program [phone] (Team Pager from [place] to [place]) [phone] ([place] office) [phone] [facility] office) [phone] [facility] office)

## 2016-07-06 NOTE — Progress Notes (Signed)
Initial Nutrition Assessment     INTERVENTION:  -Monitor diet progression and intake. Recommend soft diet once able to tolerate solid foods -Pt may benefit from adding Ensure Enlive po BID, each supplement provides 350 kcal and 20 grams of protein secondary to poor po intake prior to admission    NUTRITION DIAGNOSIS:   Inadequate oral intake related to altered GI function, poor appetite as evidenced by per patient/family report.    GOAL:   Patient will meet greater than or equal to 90% of their needs    MONITOR:   Diet advancement  REASON FOR ASSESSMENT:   Malnutrition Screening Tool    ASSESSMENT:      75 y.o. Female with history of chronic anemia due to slow GI bleed secondary to AVM, HTN, HLD, DM. Pt admitted due to weakness, dizziness, anemia  Pt reports appetite has been poor, eating about 50% of normal intake over the last month.  Currently NPO  Medications reviewed: Fe sulfate, aspart, carafate, vit B 12, D5 1/2 NS at 12m/hr  Labs reviewed: creatinine 1.10  Nutrition-Focused physical exam completed. Findings are no fat depletion, mild muscle depletion, and no edema.    Diet Order:  Diet NPO time specified  Skin:  Reviewed, no issues  Last BM:  9/13  Height:   Ht Readings from Last 1 Encounters:  07/05/16 '5\' 5"'$  (1.651 m)    Weight: 5% wt loss in the last 8 months, not significant  Wt Readings from Last 1 Encounters:  07/05/16 119 lb 9.6 oz (54.3 kg)    Ideal Body Weight:     BMI:  Body mass index is 19.9 kg/m.  Estimated Nutritional Needs:   Kcal:  1620-1800 kcals/d  Protein:  65-81 g/d  Fluid:  >/= 16038md  EDUCATION NEEDS:   No education needs identified at this time  Dravin Lance B. AlZenia ResidesRDEuniceLDBlue Hillpager) Weekend/On-Call pager ([phone]

## 2016-07-06 NOTE — Progress Notes (Signed)
Patient unable to drink GoLYTELY causing nausea. Dr. Vira Agar notified, Miralax mixed in gatorade ordered in replacemend for bowel prep.

## 2016-07-06 NOTE — Consult Note (Signed)
Patient was given a full liquid diet instead of a clear liquid diet.  Will give dulcolax tabs tonight and Miralax tomorrow early morning as a prep. Clear liquid diet.

## 2016-07-06 NOTE — Evaluation (Signed)
Physical Therapy Evaluation Patient Details Name: Kathryn Ware MRN: [id number] DOB: [birth year] Today's Date: 07/06/2016   History of Present Illness  Pt admitted for anemia with complaints of weakness/dizziness.  Pt with history of chronic anemia secondary to GI bleed, DM, and HLD. Pt with plans for EGD and colonoscopy next date.   Clinical Impression  Pt is a pleasant 75 year old female who was admitted for anemia. Pt is now s/p blood transfusion and is undergoing further GI procedures tomorrow. Pt demonstrates all bed mobility/transfers/ambulation at baseline level with no AD required. Pt does not require any further PT needs at this time. Pt educated to remain active while hospitalized. Pt will be dc in house and does not require follow up. RN aware. Will dc current orders.      Follow Up Recommendations No PT follow up    Equipment Recommendations  None recommended by PT    Recommendations for Other Services       Precautions / Restrictions Precautions Precautions: Fall Restrictions Weight Bearing Restrictions: No      Mobility  Bed Mobility Overal bed mobility: Independent             General bed mobility comments: safe technique performed  Transfers Overall transfer level: Independent Equipment used: None             General transfer comment: safe technique performed with no LOB noted  Ambulation/Gait Ambulation/Gait assistance: Supervision Ambulation Distance (Feet): 200 Feet Assistive device: None Gait Pattern/deviations: Step-through pattern     General Gait Details: ambulated with normal reciprocal gait pattern. Pt able to carry conversation during ambulation with reciprocal arm swing. Good speed noted  Stairs            Wheelchair Mobility    Modified Rankin (Stroke Patients Only)       Balance Overall balance assessment: No apparent balance deficits (not formally assessed)                                            Pertinent Vitals/Pain Pain Assessment: No/denies pain    Home Living Family/patient expects to be discharged to:: Private residence Living Arrangements:  (usually lives alone, however sis plans to stay with her) Available Help at Discharge: Family Type of Home: House Home Access: Stairs to enter Entrance Stairs-Rails: Can reach both Technical brewer of Steps: 3 Home Layout: One level Home Equipment: None      Prior Function Level of Independence: Independent         Comments: was driving and working     Journalist, newspaper        Extremity/Trunk Assessment   Upper Extremity Assessment: Overall WFL for tasks assessed           Lower Extremity Assessment: Generalized weakness (grossly 4+/5)         Communication   Communication: No difficulties  Cognition Arousal/Alertness: Awake/alert Behavior During Therapy: WFL for tasks assessed/performed Overall Cognitive Status: Within Functional Limits for tasks assessed                      General Comments      Exercises Other Exercises Other Exercises: Seated ther-ex performed including B LAQ, ankle pumps, and mini squats x 10 reps with supervision. Safe technique performed      Assessment/Plan    PT Assessment Patent does not need any  further PT services  PT Diagnosis     PT Problem List    PT Treatment Interventions     PT Goals (Current goals can be found in the Care Plan section) Acute Rehab PT Goals Patient Stated Goal: to go home PT Goal Formulation: All assessment and education complete, DC therapy Time For Goal Achievement: 07/20/16 Potential to Achieve Goals: Good    Frequency     Barriers to discharge        Co-evaluation               End of Session Equipment Utilized During Treatment: Gait belt Activity Tolerance: Patient tolerated treatment well Patient left: in bed Nurse Communication: Mobility status         Time: 1416-1436 PT Time Calculation (min)  (ACUTE ONLY): 20 min   Charges:   PT Evaluation $PT Eval Low Complexity: 1 Procedure PT Treatments $Therapeutic Exercise: 8-22 mins   PT G Codes:        Amando Chaput 07-30-16, 3:14 PM  Greggory Stallion, PT, DPT [phone]

## 2016-07-06 NOTE — Consult Note (Signed)
GI Inpatient Consult Note  Reason for Consult: anemia   Attending Requesting Consult: Dr. Posey Pronto  History of Present Illness: Kathryn Ware is a 75 y.o. female seen for evaluation of anemia at the request of Dr. Posey Pronto. PMHx of AVM, DM, HTN, HLD.    She was seen in 10/2015 by [facility] for follow up of GI bleed, admitted 09/2015 w/ Hgb 6.4 requiring transfusion. She declined colonoscopy at that time. Had EGD 10/2015 w/ angiodysplasia in duodenum treated w/ APC.    She reports her stools are consistently dark - this has been going on since on an iron supplement BID x 4 years, she does feel they have been slightly darker than average over past 1-2 months. She denies any obvious maroon stools, bright red blood in stools. She denies any abdominal pain. Usually moving her stools twice a day, denies any fiber, laxatives, etc.   She denies any upper GI symptoms. She denies dysphagia since her EGD. No GERD symptoms on her PPI. No nausea/vomiting, epigastric pain. Denies NSAID use more than 1-2x/month.   Reports weakness over the past month. Dizziness ongoing a few days which is what prompted ER visit    Last Colonoscopy: Colonoscopy - Dr. Candace Cruise - 07/2006 - Few small angiectasias without bleeding in distal sigmoid.   Last Endoscopy: EGD - 10/2015 - AVMs in duodenum treated with APC.    Past Medical History:  Past Medical History:  Diagnosis Date  . Blood transfusion without reported diagnosis   . Diabetes mellitus type II   . GI AVM (gastrointestinal arteriovenous vascular malformation)    stomach  . History of echocardiogram 3/15   a. 12/2013: EF 60-65%, DD, mild LVH, nl RV size & systolic function, mild MR/TR, mild AS, nl RVSP  . History of nuclear stress test    a. 12/2013: low risk, no sig WMA, nondiag EKG 2/2 baseline LVH w/ repol abnl, no sig ischemia, EF 70% no artifact  . HLD (hyperlipidemia)   . HTN (hypertension)   . RLS (restless legs syndrome)     Problem List: Patient Active  Problem List   Diagnosis Date Noted  . Anemia 07/05/2016  . Aortic sclerosis ([place]) 02/23/2016  . Restless legs syndrome (RLS) 02/23/2016  . 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 Acute posthemorrhagic anemia 03/10/2015  . Anemia due to gastrointestinal blood loss 02/18/2015  . Advance directive discussed with patient 02/18/2015  . Nicotine dependence 10/29/2013  . Routine general medical examination at a health care facility 08/17/2011  . Diabetes mellitus type 2, controlled, without complications ([place]) 23/76/2831  . HYPERCHOLESTEROLEMIA 02/02/2007  . Essential hypertension, benign 02/02/2007  . Angiodysplasia of intestinal tract 02/02/2007    Past Surgical History: Past Surgical History:  Procedure Laterality Date  . ABDOMINAL ADHESION SURGERY  11/2001   Dr. Tamala Julian  . ABDOMINAL HYSTERECTOMY  1976   RSO (fibroid)  . DOBUTAMINE STRESS ECHO  11/10   normal  . DOPPLER ECHOCARDIOGRAPHY     EF 55%, mild MR, TR  . ESOPHAGOGASTRODUODENOSCOPY  07/2006   multiple angioectasias  . ESOPHAGOGASTRODUODENOSCOPY Left 03/12/2015   Procedure: place tube in throat and evaluate stomach and duodenum for source of bleeding. Cauterize if concern for rebleeding.;  Surgeon: Hulen Luster, MD;  Location[facility] ENDOSCOPY;  Service: Endoscopy;  Laterality: Left;  . ESOPHAGOGASTRODUODENOSCOPY (EGD) WITH PROPOFOL N/A 11/23/2015   Procedure: ESOPHAGOGASTRODUODENOSCOPY (EGD) WITH PROPOFOL;  Surgeon: Hulen Luster, MD;  Location: [facility] END[facility]COPY;  Service: Gastroenterology;  Laterality: N/A;  . laryngeal  polyp  10/2008   Dr. Tami Ribas  . TOP      Allergies: Allergies  Allergen Reactions  . Metformin     REACTION: diarrhea, patient aware that she is onmetformin when it is listed that she is allergic  . Nifedipine     REACTION: cough    Home Medications: Prescriptions Prior to Admission  Medication Sig Dispense Refill Last Dose  . ferrous sulfate 325 (65 FE) MG tablet Take 325 mg by mouth daily with  breakfast.     07/05/2016 at 0600  . glipiZIDE (GLUCOTROL XL) 10 MG 24 hr tablet Take 1 tablet (10 mg total) by mouth daily with breakfast. 1 tablet 0 07/05/2016 at 0600  . lisinopril-hydrochlorothiazide (PRINZIDE,ZESTORETIC) 20-12.5 MG tablet TAKE 2 TABLETS BY MOUTH DAILY 60 tablet 11 07/05/2016 at 0600  . metFORMIN (GLUCOPHAGE-XR) 500 MG 24 hr tablet Take 500 mg by mouth daily with breakfast.   07/04/2016 at 0600  . omeprazole (PRILOSEC) 40 MG capsule TAKE 1 CAPSULE(40 MG) BY MOUTH TWICE DAILY BEFORE A MEAL 180 capsule 3 07/04/2016 at 0600  . simvastatin (ZOCOR) 80 MG tablet TAKE 1 TABLET(80 MG) BY MOUTH DAILY 90 tablet 3 07/04/2016 at 2230  . sucralfate (CARAFATE) 1 G tablet TAKE 1 TABLET BY MOUTH BEFORE MEALS AND EVERY NIGHT AT BEDTIME 120 tablet 0 07/04/2016 at 2230  . vitamin B-12 (CYANOCOBALAMIN) 1000 MCG tablet Take 1,000 mcg by mouth daily.   07/05/2016 at 0600  . clonazePAM (KLONOPIN) 0.5 MG tablet Take 0.5 mg by mouth at bedtime.   Not Taking at Unknown time  . ONE TOUCH ULTRA TEST test strip USE AS DIRECTED ONCE DAILY 100 each 3   . tetanus & diphtheria toxoids, adult, (TENIVAC) 5-2 LFU injection Inject 0.5 mLs into the muscle once. 0.5 mL 0    Home medication reconciliation was completed with the patient.   Scheduled Inpatient Medications:   . atorvastatin  40 mg Oral q1800  . docusate sodium  100 mg Oral BID  . ferrous sulfate  325 mg Oral Q breakfast  . glipiZIDE  10 mg Oral Q breakfast  . lisinopril  40 mg Oral Daily   And  . hydrochlorothiazide  25 mg Oral Daily  . insulin aspart  0-9 Units Subcutaneous TID WC  . nicotine  21 mg Transdermal Daily  . pantoprazole  40 mg Oral Daily  . sucralfate  1 g Oral BID  . vitamin B-12  1,000 mcg Oral Daily    Continuous Inpatient Infusions:   . dextrose 5 % and 0.45% NaCl 75 mL/hr at 07/06/16 0823    PRN Inpatient Medications:  acetaminophen **OR** acetaminophen, ondansetron **OR** ondansetron (ZOFRAN) IV, senna-docusate  Family  History: family history includes Cancer in her sister; Hypertension in her mother; Stroke in her father.    Social History:   reports that she has been smoking Cigarettes.  She has been smoking about 1.00 pack per day. She has never used smokeless tobacco. She reports that she does not drink alcohol or use drugs.    Review of Systems: Constitutional: Weight is stable.  Eyes: No changes in vision. ENT: No oral lesions, sore throat.  GI: see HPI.  Heme/Lymph: No easy bruising.  CV: No chest pain.  GU: No hematuria.  Integumentary: No rashes.  Neuro: No headaches.  Psych: No depression/anxiety.  Endocrine: No heat/cold intolerance.  Allergic/Immunologic: No urticaria.  Resp: No cough, SOB.  Musculoskeletal: No joint swelling.    Physical Examination: BP (!) 126/46 (BP Location:  Left Arm)   Pulse 90   Temp 98.5 F (36.9 C) (Oral)   Resp 16   Ht '5\' 5"'$  (1.651 m)   Wt 54.3 kg (119 lb 9.6 oz)   SpO2 99%   BMI 19.90 kg/m  Gen: NAD, alert and oriented x 4 HEENT: PEERLA, EOMI, Neck: supple, no JVD or thyromegaly Chest: CTA bilaterally, no wheezes, crackles, or other adventitious sounds CV: RRR, no m/g/c/r Abd: soft, NT, ND, +BS in all four quadrants; no HSM, guarding, ridigity, or rebound tenderness RECTAL-dark stool with very slight maroon tint, heme positive stool.  Ext: no edema, well perfused with 2+ pulses, Skin: no rash or lesions noted Lymph: no LAD  Data: Lab Results  Component Value Date   WBC 7.3 07/06/2016   HGB 9.0 (L) 07/06/2016   HCT 26.5 (L) 07/06/2016   MCV 82.9 07/06/2016   PLT 221 07/06/2016    Recent Labs Lab 07/05/16 0808 07/06/16 0444  HGB 7.3* 9.0*   Lab Results  Component Value Date   NA 140 07/06/2016   K 4.2 07/06/2016   CL 113 (H) 07/06/2016   CO2 22 07/06/2016   BUN 16 07/06/2016   CREATININE 1.10 (H) 07/06/2016   Lab Results  Component Value Date   ALT 11 (L) 07/06/2016   AST 19 07/06/2016   ALKPHOS 36 (L) 07/06/2016    BILITOT 0.4 07/06/2016   No results for input(s): APTT, INR, PTT in the last 168 hours.   Assessment/Plan: Kathryn Ware is a 75 y.o. female admitted for dizziness.   1. Anemia - acute on chronic, heme positive, normocytic, on iron BID x years, normal BUN/Cr ratio. Hgb was 10 in 02/2016, dropped to 7.3 on admission, improved to 9.0 after 1 unit. Known AVMs in small bowel and colon. Stools dark - consider gastric, small bowel, right colon. Has been 10 years since last colonoscopy. She had sprite to drink few minutes ago-can not do EGD today. Given has been 10 years since last colonoscopy will plan for double procedure tomorrow for evaluation.   She denies CP/SOB.     Recommendations:  1. EGD/colonoscopy tomorrow 2. Clear liquids rest of day then NPO at midnight 3. Bowel prep today.    Case discussed w/ Dr. Vira Agar who is in agreement w/ plan.    Thank you for the consult. Please call with questions or concerns.  Laurine Blazer, PA-C [place]

## 2016-07-06 NOTE — Care Management (Signed)
Admitted to [facility] with the diagnosis of anemia. Usually lives alone, but sister Meyer Cory has been living in the home x 2 months. Last seen Dr. Silvio Pate May 2017. No home health. No skilled facility. No home oxygen. Uses no aids for ambulation. No falls. God appetite. Takes care of all basic and instrumental activities of daily living herself, drives. Prescriptions are filled at [facility] in [place]. Holiday representative (Personnel officer) seen her last month. Either sister or niece will transport. Shelbie Ammons RN MSN CCM Care Management [phone]

## 2016-07-06 NOTE — Care Management Important Message (Signed)
Important Message  Patient Details  Name: Kathryn Ware MRN: [id number] Date of Birth: Nov 21, 1940   Medicare Important Message Given:  Yes    Shelbie Ammons, RN 07/06/2016, 9:20 AM

## 2016-07-06 NOTE — Progress Notes (Signed)
[place] at [facility]                                                                                                                                                                                            Patient Demographics   Kathryn Ware, is a 75 y.o. female, DOB - 1941/08/03, QMV:[id number]  Admit date - 07/05/2016   Admitting Physician Fritzi Mandes, MD  Outpatient Primary MD for the patient is Viviana Simpler, MD   LOS - 1  Subjective: Patient admitted with symptomatic anemia. She is nothing by mouth right now and is hungry. Denies any chest pain or shortness of breath has not been seen by GI yet.    Review of Systems:   CONSTITUTIONAL: No documented fever. No fatigue, weakness. No weight gain, no weight loss.  EYES: No blurry or double vision.  ENT: No tinnitus. No postnasal drip. No redness of the oropharynx.  RESPIRATORY: No cough, no wheeze, no hemoptysis. No dyspnea.  CARDIOVASCULAR: No chest pain. No orthopnea. No palpitations. No syncope.  GASTROINTESTINAL: No nausea, no vomiting or diarrhea. No abdominal pain. Positive melena no hematochezia.  GENITOURINARY: No dysuria or hematuria.  ENDOCRINE: No polyuria or nocturia. No heat or cold intolerance.  HEMATOLOGY: No anemia. No bruising. No bleeding.  INTEGUMENTARY: No rashes. No lesions.  MUSCULOSKELETAL: No arthritis. No swelling. No gout.  NEUROLOGIC: No numbness, tingling, or ataxia. No seizure-type activity.  PSYCHIATRIC: No anxiety. No insomnia. No ADD.    Vitals:   Vitals:   07/05/16 1743 07/05/16 2045 07/06/16 0428 07/06/16 0722  BP: (!) 138/54 (!) 151/56 (!) 130/51 (!) 126/46  Pulse: 94 90 86 90  Resp:  16 (!) 24 16  Temp: 98 F (36.7 C) 98.4 F (36.9 C) 98.6 F (37 C) 98.5 F (36.9 C)  TempSrc: Oral Oral Oral Oral  SpO2: 99% 100% 99% 99%  Weight:      Height:        Wt Readings from Last 3 Encounters:  07/05/16 119 lb 9.6 oz (54.3 kg)  02/23/16 120 lb (54.4  kg)  11/23/15 126 lb (57.2 kg)     Intake/Output Summary (Last 24 hours) at 07/06/16 1131 Last data filed at 07/06/16 2841  Gross per 24 hour  Intake             1700 ml  Output              900 ml  Net              800 ml    Physical Exam:   GENERAL: Pleasant-appearing in no  apparent distress.  HEAD, EYES, EARS, NOSE AND THROAT: Atraumatic, normocephalic. Extraocular muscles are intact. Pupils equal and reactive to light. Sclerae anicteric. No conjunctival injection. No oro-pharyngeal erythema.  NECK: Supple. There is no jugular venous distention. No bruits, no lymphadenopathy, no thyromegaly.  HEART: Regular rate and rhythm,. No murmurs, no rubs, no clicks.  LUNGS: Clear to auscultation bilaterally. No rales or rhonchi. No wheezes.  ABDOMEN: Soft, flat, nontender, nondistended. Has good bowel sounds. No hepatosplenomegaly appreciated.  EXTREMITIES: No evidence of any cyanosis, clubbing, or peripheral edema.  +2 pedal and radial pulses bilaterally.  NEUROLOGIC: The patient is alert, awake, and oriented x3 with no focal motor or sensory deficits appreciated bilaterally.  SKIN: Moist and warm with no rashes appreciated.  Psych: Not anxious, depressed LN: No inguinal LN enlargement    Antibiotics   Anti-infectives    None      Medications   Scheduled Meds: . atorvastatin  40 mg Oral q1800  . docusate sodium  100 mg Oral BID  . ferrous sulfate  325 mg Oral Q breakfast  . glipiZIDE  10 mg Oral Q breakfast  . lisinopril  40 mg Oral Daily   And  . hydrochlorothiazide  25 mg Oral Daily  . insulin aspart  0-9 Units Subcutaneous TID WC  . nicotine  21 mg Transdermal Daily  . pantoprazole  40 mg Oral Daily  . sucralfate  1 g Oral BID  . vitamin B-12  1,000 mcg Oral Daily   Continuous Infusions: . dextrose 5 % and 0.45% NaCl 75 mL/hr at 07/06/16 0823   PRN Meds:.acetaminophen **OR** acetaminophen, ondansetron **OR** ondansetron (ZOFRAN) IV, senna-docusate   Data Review:    Micro Results No results found for this or any previous visit (from the past 240 hour(s)).  Radiology Reports Dg Chest 1 View  Result Date: 07/05/2016 CLINICAL DATA:  Weakness, dizziness for 2 days, cough and congestion EXAM: CHEST 1 VIEW COMPARISON:  Chest x-ray of 03/10/2015 FINDINGS: The lungs remain clear and somewhat hyperaerated. Mediastinal and hilar contours are unremarkable. The heart is within normal limits in size. No bony abnormality is seen. IMPRESSION: No active lung disease.  Slight hyper aeration. Electronically Signed   By: Ivar Drape M.D.   On: 07/05/2016 08:52     CBC  Recent Labs Lab 07/05/16 0808 07/06/16 0444  WBC 9.1 7.3  HGB 7.3* 9.0*  HCT 22.1* 26.5*  PLT 263 221  MCV 81.5 82.9  MCH 27.0 28.3  MCHC 33.1 34.1  RDW 15.8* 16.2*  LYMPHSABS 1.3  --   MONOABS 0.6  --   EOSABS 0.0  --   BASOSABS 0.0  --     Chemistries   Recent Labs Lab 07/05/16 0808 07/06/16 0444  NA 136 140  K 4.3 4.2  CL 107 113*  CO2 21* 22  GLUCOSE 186* 65  BUN 32* 16  CREATININE 1.40* 1.10*  CALCIUM 9.3 9.1  AST  --  19  ALT  --  11*  ALKPHOS  --  36*  BILITOT  --  0.4   ------------------------------------------------------------------------------------------------------------------ estimated creatinine clearance is 38.5 mL/min (by C-G formula based on SCr of 1.1 mg/dL (H)). ------------------------------------------------------------------------------------------------------------------ No results for input(s): HGBA1C in the last 72 hours. ------------------------------------------------------------------------------------------------------------------ No results for input(s): CHOL, HDL, LDLCALC, TRIG, CHOLHDL, LDLDIRECT in the last 72 hours. ------------------------------------------------------------------------------------------------------------------ No results for input(s): TSH, T4TOTAL, T3FREE, THYROIDAB in the last 72 hours.  Invalid input(s):  FREET3 ------------------------------------------------------------------------------------------------------------------ No results for input(s): VITAMINB12, FOLATE, FERRITIN, TIBC,  IRON, RETICCTPCT in the last 72 hours.  Coagulation profile No results for input(s): INR, PROTIME in the last 168 hours.  No results for input(s): DDIMER in the last 72 hours.  Cardiac Enzymes  Recent Labs Lab 07/05/16 0808  TROPONINI 0.03*   ------------------------------------------------------------------------------------------------------------------ Invalid input(s): POCBNP    Assessment & Plan   Lourdez Mcgahan  is a 75 y.o. female with a known history of Chronic anemia due to slow GI bleed secondary to AVM who had undergone last EGD in January 2017 with argon laser treatment for AVM-gastric, hypertension, hyperlipidemia comes through the emergency room after she started having dizziness for an weakness for last few days started noticing some black spots. Patient has these symptoms when she is anemic to the emergency room pump inhibitor 7.3  1. Symptom symptomatically anemia acute on chronic Due to AVM Patient's hemoglobin is stable Follow CBC in the morning    2. Chronic anemia secondary to AV malformation in her stomach and duodenam last EGD done by Dr. Candace Cruise in January 2017 with argon laser treatment -Continue iron pills - GI consult pending I will start her on a full liquid diet for now -Avoid NSAIDs  3. Diabetes type 2 without complication Hypoglycemia overnight due to nothing by mouth  4. Hyperlipidemia continuous statins  5. Hypertension blood continue therapy with lisinopril and hydrochlorothiazide use when necessary hydralazine     Code Status Orders        Start     Ordered   07/05/16 1301  Full code  Continuous     07/05/16 1301    Code Status History    Date Active Date Inactive Code Status Order ID Comments User Context   10/13/2015  8:53 PM 10/14/2015  4:36 PM  Full Code [id number]  Dustin Flock, MD Inpatient   03/10/2015  9:31 PM 03/13/2015  1:11 PM Full Code [id number]  Theodoro Grist, MD Inpatient           Consults gi  DVT Prophylaxis  scd's  Lab Results  Component Value Date   PLT 221 07/06/2016     Time Spent in minutes  14mn Greater than 50% of time spent in care coordination and counseling patient regarding the condition and plan of care.   PDustin FlockM.D on 07/06/2016 at 11:31 AM  Between 7am to 6pm - Pager - [phone]  After 6pm go to [email or web address] - password [email or web address]   Office  [phone]

## 2016-07-07 ENCOUNTER — Encounter
Admission: EM | Disposition: A | Discharge status: Home / Self Care | Payer: Self-pay | Source: Home / Self Care | Attending: Internal Medicine

## 2016-07-07 ENCOUNTER — Inpatient Hospital Stay: Payer: Medicare Other | Admitting: Certified Registered"

## 2016-07-07 ENCOUNTER — Encounter: Payer: Self-pay | Admitting: Anesthesiology

## 2016-07-07 HISTORY — PX: ESOPHAGOGASTRODUODENOSCOPY (EGD) WITH PROPOFOL: SHX5813

## 2016-07-07 HISTORY — PX: COLONOSCOPY WITH PROPOFOL: SHX5780

## 2016-07-07 LAB — BASIC METABOLIC PANEL
ANION GAP: 5 (ref 5–15)
BUN: 9 mg/dL (ref 6–20)
CALCIUM: 9.3 mg/dL (ref 8.9–10.3)
CO2: 23 mmol/L (ref 22–32)
CREATININE: 0.94 mg/dL (ref 0.44–1.00)
Chloride: 111 mmol/L (ref 101–111)
GFR calc Af Amer: >60 mL/min (ref >60)
GFR, EST NON AFRICAN AMERICAN: 58 mL/min — AB (ref >60)
GLUCOSE: 126 mg/dL — AB (ref 65–99)
Potassium: 3.8 mmol/L (ref 3.5–5.1)
Sodium: 139 mmol/L (ref 135–145)

## 2016-07-07 LAB — GLUCOSE, CAPILLARY
GLUCOSE-CAPILLARY: 294 mg/dL — AB (ref 65–99)
Glucose-Capillary: 99 mg/dL (ref 65–99)
Glucose-Capillary: 120 mg/dL — ABNORMAL HIGH (ref 65–99)

## 2016-07-07 LAB — CBC
HCT: 27.9 % — ABNORMAL LOW (ref 35.0–47.0)
Hemoglobin: 9.5 g/dL — ABNORMAL LOW (ref 12.0–16.0)
MCH: 28.1 pg (ref 26.0–34.0)
MCHC: 33.9 g/dL (ref 32.0–36.0)
MCV: 82.9 fL (ref 80.0–100.0)
PLATELETS: 246 10*3/uL (ref 150–440)
RBC: 3.36 MIL/uL — ABNORMAL LOW (ref 3.80–5.20)
RDW: 16.4 % — AB (ref 11.5–14.5)
WBC: 6.9 10*3/uL (ref 3.6–11.0)

## 2016-07-07 SURGERY — COLONOSCOPY WITH PROPOFOL
Anesthesia: General

## 2016-07-07 SURGERY — EGD (ESOPHAGOGASTRODUODENOSCOPY)
Anesthesia: General

## 2016-07-07 MED ORDER — PROPOFOL 500 MG/50ML IV EMUL
INTRAVENOUS | Status: DC | PRN
  Administered 2016-07-07: 120 ug/kg/min via INTRAVENOUS

## 2016-07-07 MED ORDER — IPRATROPIUM-ALBUTEROL 0.5-2.5 (3) MG/3ML IN SOLN
3.0000 mL | Freq: Once | RESPIRATORY_TRACT | Status: AC
  Administered 2016-07-07: 3 mL via RESPIRATORY_TRACT

## 2016-07-07 MED ORDER — IPRATROPIUM-ALBUTEROL 0.5-2.5 (3) MG/3ML IN SOLN
RESPIRATORY_TRACT | Status: AC
  Administered 2016-07-07: 17:00:00
  Filled 2016-07-07: qty 3

## 2016-07-07 MED ORDER — LIDOCAINE 2% (20 MG/ML) 5 ML SYRINGE
INTRAMUSCULAR | Status: DC | PRN
  Administered 2016-07-07: 50 mg via INTRAVENOUS

## 2016-07-07 MED ORDER — PROPOFOL 10 MG/ML IV BOLUS
INTRAVENOUS | Status: DC | PRN
  Administered 2016-07-07: 50 mg via INTRAVENOUS

## 2016-07-07 NOTE — Op Note (Signed)
[facility] Gastroenterology Patient Name: Kathryn Ware Procedure Date: 07/07/2016 6:05 PM MRN: [id number] Account #: [id number] Date of Birth: 1941-08-11 Admit Type: Inpatient Age: 75 Room: [facility] ENDO ROOM 1 Gender: Female Note Status: Finalized Procedure:            Colonoscopy Indications:          Heme positive stool, Melena Providers:            Manya Silvas, MD Referring MD:         Venia Carbon (Referring MD) Medicines:            Propofol per Anesthesia Complications:        No immediate complications. Procedure:            Pre-Anesthesia Assessment:                       - After reviewing the risks and benefits, the patient                        was deemed in satisfactory condition to undergo the                        procedure.                       After obtaining informed consent, the colonoscope was                        passed under direct vision. Throughout the procedure,                        the patient's blood pressure, pulse, and oxygen                        saturations were monitored continuously. The                        Colonoscope was introduced through the anus and                        advanced to the the cecum, identified by appendiceal                        orifice and ileocecal valve. The patient tolerated the                        procedure well. The quality of the bowel preparation                        was adequate to identify polyps. Findings:      A 15 mm polyp was found in the mid ascending colon. The polyp was       sessile. The polyp was removed with a piecemeal technique using a hot       snare. Resection and retrieval were complete. To prevent bleeding after       the polypectomy, three hemostatic clips were successfully placed. There       was no bleeding at the end of the procedure.      Internal hemorrhoids were found during endoscopy. The hemorrhoids were       medium-sized and Grade I (internal  hemorrhoids that do not prolapse).      The exam was otherwise without abnormality. Impression:           - One 15 mm polyp in the mid ascending colon, removed                        piecemeal using a hot snare. Resected and retrieved.                        Clips were placed.                       - Internal hemorrhoids.                       - The examination was otherwise normal. Recommendation:       - Await pathology results. Manya Silvas, MD 07/07/2016 7:10:22 PM This report has been signed electronically. Number of Addenda: 0 Note Initiated On: 07/07/2016 6:05 PM Scope Withdrawal Time: 0 hours 20 minutes 47 seconds  Total Procedure Duration: 0 hours 35 minutes 44 seconds       [facility]

## 2016-07-07 NOTE — Transfer of Care (Signed)
Immediate Anesthesia Transfer of Care Note  Patient: Kathryn Ware  Procedure(s) Performed: Procedure(s): COLONOSCOPY WITH PROPOFOL (N/A) ESOPHAGOGASTRODUODENOSCOPY (EGD) WITH PROPOFOL (N/A)  Patient Location: Endoscopy Unit  Anesthesia Type:General  Level of Consciousness: awake  Airway & Oxygen Therapy: Patient connected to nasal cannula oxygen  Post-op Assessment: Post -op Vital signs reviewed and stable  Post vital signs: stable  Last Vitals:  Vitals:   07/07/16 1452 07/07/16 1913  BP: (!) 134/53 (!) 149/64  Pulse: 75 89  Resp: 18   Temp: 37 C     Last Pain:  Vitals:   07/07/16 1452  TempSrc: Oral  PainSc:          Complications: No apparent anesthesia complications

## 2016-07-07 NOTE — Anesthesia Postprocedure Evaluation (Signed)
Anesthesia Post Note  Patient: Kathryn Ware  Procedure(s) Performed: Procedure(s) (LRB): COLONOSCOPY WITH PROPOFOL (N/A) ESOPHAGOGASTRODUODENOSCOPY (EGD) WITH PROPOFOL (N/A)  Patient location during evaluation: Endoscopy Anesthesia Type: General Level of consciousness: awake and alert Pain management: pain level controlled Vital Signs Assessment: post-procedure vital signs reviewed and stable Respiratory status: spontaneous breathing, nonlabored ventilation, respiratory function stable and patient connected to nasal cannula oxygen Cardiovascular status: blood pressure returned to baseline and stable Postop Assessment: no signs of nausea or vomiting Anesthetic complications: no    Last Vitals:  Vitals:   07/07/16 1945 07/07/16 2003  BP: (!) 147/79 (!) 146/63  Pulse:  71  Resp:  20  Temp:  36.4 C    Last Pain:  Vitals:   07/07/16 2003  TempSrc: Oral  PainSc: 0-No pain                 Precious Haws Piscitello

## 2016-07-07 NOTE — Progress Notes (Signed)
Dr. Vira Agar updated: bowel prep almost complete.  Clear liquids (water and apple juice only) until 10am then strict NPO.  Patient updated.

## 2016-07-07 NOTE — Consult Note (Signed)
Patient with 5 AVM of duodenum and a large polyp in ascending colon which was removed and clip applied.  She can have full liquids for diet for 2-3 days then soft diet.  Could go home tomorrow if hgb stable.

## 2016-07-07 NOTE — Anesthesia Preprocedure Evaluation (Signed)
Anesthesia Evaluation  Patient identified by MRN, date of birth, ID band Patient awake    Reviewed: Allergy & Precautions, H&P , NPO status , Patient's Chart, lab work & pertinent test results  History of Anesthesia Complications Negative for: history of anesthetic complications  Airway Mallampati: III  TM Distance: <3 FB Neck ROM: limited    Dental  (+) Poor Dentition, Chipped, Missing, Edentulous Upper, Edentulous Lower   Pulmonary neg shortness of breath, Current Smoker,    breath sounds clear to auscultation + decreased breath sounds+ wheezing      Cardiovascular Exercise Tolerance: Good hypertension, (-) angina(-) Past MI and (-) DOE Normal cardiovascular exam Rhythm:regular Rate:Normal     Neuro/Psych negative neurological ROS  negative psych ROS   GI/Hepatic negative GI ROS, Neg liver ROS, neg GERD  ,  Endo/Other  diabetes, Type 2  Renal/GU negative Renal ROS  negative genitourinary   Musculoskeletal   Abdominal   Peds  Hematology negative hematology ROS (+)   Anesthesia Other Findings Past Medical History: No date: Blood transfusion without reported diagnosis No date: Diabetes mellitus type II No date: GI AVM (gastrointestinal arteriovenous vascula*     Comment: stomach 3/15: History of echocardiogram     Comment: a. 12/2013: EF 60-65%, DD, mild LVH, nl RV size              & systolic function, mild MR/TR, mild AS, nl               RVSP No date: History of nuclear stress test     Comment: a. 12/2013: low risk, no sig WMA, nondiag EKG               2/2 baseline LVH w/ repol abnl, no sig               ischemia, EF 70% no artifact No date: HLD (hyperlipidemia) No date: HTN (hypertension) No date: RLS (restless legs syndrome)  Past Surgical History: 11/2001: ABDOMINAL ADHESION SURGERY     Comment: Dr. Tamala Julian 1976: ABDOMINAL HYSTERECTOMY     Comment: RSO (fibroid) 11/10: DOBUTAMINE STRESS ECHO  Comment: normal No date: DOPPLER ECHOCARDIOGRAPHY     Comment: EF 55%, mild MR, TR 07/2006: ESOPHAGOGASTRODUODENOSCOPY     Comment: multiple angioectasias 03/12/2015: ESOPHAGOGASTRODUODENOSCOPY Left     Comment: Procedure: place tube in throat and evaluate               stomach and duodenum for source of bleeding.               Cauterize if concern for rebleeding.;  Surgeon:              Hulen Luster, MD;  Location: [facility] ENDOSCOPY;                Service: Endoscopy;  Laterality: Left; 11/23/2015: ESOPHAGOGASTRODUODENOSCOPY (EGD) WITH PROPOFOL N/A     Comment: Procedure: ESOPHAGOGASTRODUODENOSCOPY (EGD)               WITH PROPOFOL;  Surgeon: Hulen Luster, MD;                Location: [facility] ENDOSCOPY;  Service:               Gastroenterology;  Laterality: N/A; 10/2008: laryngeal polyp     Comment: Dr. Tami Ribas No date: TOP  BMI    Body Mass Index:  19.90 kg/m      Reproductive/Obstetrics negative OB ROS  Anesthesia Physical Anesthesia Plan  ASA: III  Anesthesia Plan: General   Post-op Pain Management:    Induction:   Airway Management Planned:   Additional Equipment:   Intra-op Plan:   Post-operative Plan:   Informed Consent: I have reviewed the patients History and Physical, chart, labs and discussed the procedure including the risks, benefits and alternatives for the proposed anesthesia with the patient or authorized representative who has indicated his/her understanding and acceptance.   Dental Advisory Given  Plan Discussed with: Anesthesiologist, CRNA and Surgeon  Anesthesia Plan Comments:         Anesthesia Quick Evaluation

## 2016-07-07 NOTE — Op Note (Signed)
[facility] Gastroenterology Patient Name: Kathryn Ware Procedure Date: 07/07/2016 6:06 PM MRN: [id number] Account #: [id number] Date of Birth: 05-02-1941 Admit Type: Inpatient Age: 75 Room: [facility] ENDO ROOM 1 Gender: Female Note Status: Finalized Procedure:            Upper GI endoscopy Indications:          Iron deficiency anemia secondary to chronic blood loss,                        Heme positive stool, Melena Providers:            Manya Silvas, MD Referring MD:         Venia Carbon (Referring MD) Medicines:            Propofol per Anesthesia Complications:        No immediate complications. Procedure:            Pre-Anesthesia Assessment:                       - After reviewing the risks and benefits, the patient                        was deemed in satisfactory condition to undergo the                        procedure.                       After obtaining informed consent, the endoscope was                        passed under direct vision. Throughout the procedure,                        the patient's blood pressure, pulse, and oxygen                        saturations were monitored continuously. The Endoscope                        was introduced through the mouth, and advanced to the                        second part of duodenum. The upper GI endoscopy was                        accomplished without difficulty. The patient tolerated                        the procedure well. Findings:      The examined esophagus was normal.      The entire examined stomach was normal.      Two medium angioectasias without bleeding were found in the duodenal       bulb. Coagulation for tissue destruction using argon plasma at 0.4       liters/minute and 20 watts was successful. For hemostasis, three       hemostatic clips were successfully placed. There was no bleeding at the       end of the procedure.      Three small angioectasias without bleeding were  found  in the second       portion of the duodenum. Coagulation for tissue destruction using argon       plasma at 0.4 liters/minute and 20 watts was successful. Impression:           - Normal esophagus.                       - Normal stomach.                       - Two non-bleeding angioectasias in the duodenum.                        Treated with argon plasma coagulation (APC). Clips were                        placed.                       - Three non-bleeding angioectasias in the duodenum.                        Treated with argon plasma coagulation (APC).                       - No specimens collected. Recommendation:       - Await pathology results. Manya Silvas, MD 07/07/2016 6:30:36 PM This report has been signed electronically. Number of Addenda: 0 Note Initiated On: 07/07/2016 6:06 PM      [facility]

## 2016-07-07 NOTE — Discharge Summary (Signed)
[place] [facility] [place] NAME: Kathryn Ware    MR#:  [id number]  DATE OF BIRTH:  [birth year]  DATE OF ADMISSION:  07/05/2016   ADMITTING PHYSICIAN: Fritzi Mandes, MD  DATE OF DISCHARGE: 07/07/2016 PRIMARY CARE PHYSICIAN: Viviana Simpler, MD   ADMISSION DIAGNOSIS:  Dizziness Anemia, GI Bleed anemia, heme positive stool GI bleeding and anemia DISCHARGE DIAGNOSIS:  Active Problems:   Anemia symptomatic anemia,  acute on chronic GIB with melena. SECONDARY DIAGNOSIS:   Past Medical History:  Diagnosis Date  . Blood transfusion without reported diagnosis   . Diabetes mellitus type II   . GI AVM (gastrointestinal arteriovenous vascular malformation)    stomach  . History of echocardiogram 3/15   a. 12/2013: EF 60-65%, DD, mild LVH, nl RV size & systolic function, mild MR/TR, mild AS, nl RVSP  . History of nuclear stress test    a. 12/2013: low risk, no sig WMA, nondiag EKG 2/2 baseline LVH w/ repol abnl, no sig ischemia, EF 70% no artifact  . HLD (hyperlipidemia)   . HTN (hypertension)   . RLS (restless legs syndrome)    HOSPITAL COURSE:  Kathryn Ware a 75 y.o. femalewith a known history of Chronic anemia due to slow GI bleed secondary to AVM who had undergone last EGD in January 2017 with argon laser treatment for AVM-gastric, hypertension, hyperlipidemia comes through the emergency room after she started having dizziness for an weakness for last few days started noticing some black spots. Patient has these symptoms when she is anemic to the emergency room pump inhibitor 7.3  1. Symptom symptomatically anemia acute on chronic Due to AVM Patient's hemoglobin is stable. S/p 1 units PRBC transfusion.  2. Chronic anemia secondary to AV malformation in her stomach and duodenamlast EGD done by Dr. Candace Cruise in January 2017 with argon laser treatment -Continue iron pills -Avoid NSAIDs EGD and colonoscopy today per Dr. Tiffany Kocher.  3. Diabetes type 2  without complication Hypoglycemia overnight due to nothing by mouth, improved. On sliding scale.  4. Hyperlipidemia continuous statins  5. Hypertension, controlled with lisinopril and hydrochlorothiazide use when necessary hydralazine.  DISCHARGE CONDITIONS:  Stable, discharge to home today. CONSULTS OBTAINED:  Treatment Team:  Manya Silvas, MD DRUG ALLERGIES:   Allergies  Allergen Reactions  . Metformin     REACTION: diarrhea, patient aware that she is onmetformin when it is listed that she is allergic  . Nifedipine     REACTION: cough   DISCHARGE MEDICATIONS:     Medication List    TAKE these medications   clonazePAM 0.5 MG tablet Commonly known as:  KLONOPIN Take 0.5 mg by mouth at bedtime.   ferrous sulfate 325 (65 FE) MG tablet Take 325 mg by mouth daily with breakfast.   glipiZIDE 10 MG 24 hr tablet Commonly known as:  GLUCOTROL XL Take 1 tablet (10 mg total) by mouth daily with breakfast.   lisinopril-hydrochlorothiazide 20-12.5 MG tablet Commonly known as:  PRINZIDE,ZESTORETIC TAKE 2 TABLETS BY MOUTH DAILY   metFORMIN 500 MG 24 hr tablet Commonly known as:  GLUCOPHAGE-XR Take 500 mg by mouth daily with breakfast.   omeprazole 40 MG capsule Commonly known as:  PRILOSEC TAKE 1 CAPSULE(40 MG) BY MOUTH TWICE DAILY BEFORE A MEAL   ONE TOUCH ULTRA TEST test strip Generic drug:  glucose blood USE AS DIRECTED ONCE DAILY   simvastatin 80 MG tablet Commonly known as:  ZOCOR TAKE 1 TABLET(80 MG) BY MOUTH DAILY  sucralfate 1 g tablet Commonly known as:  CARAFATE TAKE 1 TABLET BY MOUTH BEFORE MEALS AND EVERY NIGHT AT BEDTIME   tetanus & diphtheria toxoids (adult) 5-2 LFU injection Commonly known as:  TENIVAC Inject 0.5 mLs into the muscle once.   vitamin B-12 1000 MCG tablet Commonly known as:  CYANOCOBALAMIN Take 1,000 mcg by mouth daily.        DISCHARGE INSTRUCTIONS:   DIET:  Heart healthy and ADA diet. DISCHARGE CONDITION:   Stable. ACTIVITY:  As tolerated. DISCHARGE LOCATION:    If you experience worsening of your admission symptoms, develop shortness of breath, life threatening emergency, suicidal or homicidal thoughts you must seek medical attention immediately by calling 911 or calling your MD immediately  if symptoms less severe.  You Must read complete instructions/literature along with all the possible adverse reactions/side effects for all the Medicines you take and that have been prescribed to you. Take any new Medicines after you have completely understood and accpet all the possible adverse reactions/side effects.   Please note  You were cared for by a hospitalist during your hospital stay. If you have any questions about your discharge medications or the care you received while you were in the hospital after you are discharged, you can call the unit and asked to speak with the hospitalist on call if the hospitalist that took care of you is not available. Once you are discharged, your primary care physician will handle any further medical issues. Please note that NO REFILLS for any discharge medications will be authorized once you are discharged, as it is imperative that you return to your primary care physician (or establish a relationship with a primary care physician if you do not have one) for your aftercare needs so that they can reassess your need for medications and monitor your lab values.    On the day of Discharge:  VITAL SIGNS:  Blood pressure (!) 134/53, pulse 75, temperature 98.6 F (37 C), temperature source Oral, resp. rate 18, height '5\' 5"'$  (1.651 m), weight 119 lb 9.6 oz (54.3 kg), SpO2 100 %. PHYSICAL EXAMINATION:  GENERAL:  75 y.o.-year-old patient lying in the bed with no acute distress.  EYES: Pupils equal, round, reactive to light and accommodation. No scleral icterus. Extraocular muscles intact.  HEENT: Head atraumatic, normocephalic. Oropharynx and nasopharynx clear.  NECK:   Supple, no jugular venous distention. No thyroid enlargement, no tenderness.  LUNGS: Normal breath sounds bilaterally, no wheezing, rales,rhonchi or crepitation. No use of accessory muscles of respiration.  CARDIOVASCULAR: S1, S2 normal. No murmurs, rubs, or gallops.  ABDOMEN: Soft, non-tender, non-distended. Bowel sounds present. No organomegaly or mass.  EXTREMITIES: No pedal edema, cyanosis, or clubbing.  NEUROLOGIC: Cranial nerves II through XII are intact. Muscle strength 5/5 in all extremities. Sensation intact. Gait not checked.  PSYCHIATRIC: The patient is alert and oriented x 3.  SKIN: No obvious rash, lesion, or ulcer.  DATA REVIEW:   CBC  Recent Labs Lab 07/07/16 0513  WBC 6.9  HGB 9.5*  HCT 27.9*  PLT 246    Chemistries   Recent Labs Lab 07/06/16 0444 07/07/16 0513  NA 140 139  K 4.2 3.8  CL 113* 111  CO2 22 23  GLUCOSE 65 126*  BUN 16 9  CREATININE 1.10* 0.94  CALCIUM 9.1 9.3  AST 19  --   ALT 11*  --   ALKPHOS 36*  --   BILITOT 0.4  --      Microbiology Results  No results found for this or any previous visit.  RADIOLOGY:  No results found.   Management plans discussed with the patient, family and they are in agreement.  CODE STATUS:     Code Status Orders        Start     Ordered   07/05/16 1301  Full code  Continuous     07/05/16 1301    Code Status History    Date Active Date Inactive Code Status Order ID Comments User Context   10/13/2015  8:53 PM 10/14/2015  4:36 PM Full Code [id number]  Dustin Flock, MD Inpatient   03/10/2015  9:31 PM 03/13/2015  1:11 PM Full Code [id number]  Theodoro Grist, MD Inpatient      TOTAL TIME TAKING CARE OF THIS PATIENT: 35 minutes.    Demetrios Loll M.D on 07/07/2016 at 4:36 PM  Between 7am to 6pm - Pager - [phone]  After 6pm go to [email or web address] - Proofreader  [facility]  Office  [phone]  CC: Primary care physician; Viviana Simpler, MD   Note: This  dictation was prepared with Dragon dictation along with smaller phrase technology. Any transcriptional errors that result from this process are unintentional.

## 2016-07-08 ENCOUNTER — Encounter: Payer: Self-pay | Admitting: Unknown Physician Specialty

## 2016-07-08 LAB — GLUCOSE, CAPILLARY: Glucose-Capillary: 130 mg/dL — ABNORMAL HIGH (ref 65–99)

## 2016-07-08 LAB — HEMOGLOBIN: Hemoglobin: 9.7 g/dL — ABNORMAL LOW (ref 12.0–16.0)

## 2016-07-08 NOTE — Care Management Important Message (Signed)
Important Message  Patient Details  Name: Kathryn Ware MRN: [id number] Date of Birth: [birth year]   Medicare Important Message Given:  Yes    Shelbie Ammons, RN 07/08/2016, 8:14 AM

## 2016-07-08 NOTE — Discharge Summary (Signed)
[facility] [place] NAME: Thereasa Ware    MR#:  [id number]  DATE OF BIRTH:  1941/08/23  DATE OF ADMISSION:  07/05/2016   ADMITTING PHYSICIAN: Fritzi Mandes, MD  DATE OF DISCHARGE: 07/08/2016  PRIMARY CARE PHYSICIAN: Viviana Simpler, MD   ADMISSION DIAGNOSIS:  Dizziness Anemia, GI Bleed anemia, heme positive stool GI bleeding and anemia DISCHARGE DIAGNOSIS:  Active Problems:   Anemia symptomatic anemia,  acute on chronic GIB due to AVM Colon polyp SECONDARY DIAGNOSIS:   Past Medical History:  Diagnosis Date  . Blood transfusion without reported diagnosis   . Diabetes mellitus type II   . GI AVM (gastrointestinal arteriovenous vascular malformation)    stomach  . History of echocardiogram 3/15   a. 12/2013: EF 60-65%, DD, mild LVH, nl RV size & systolic function, mild MR/TR, mild AS, nl RVSP  . History of nuclear stress test    a. 12/2013: low risk, no sig WMA, nondiag EKG 2/2 baseline LVH w/ repol abnl, no sig ischemia, EF 70% no artifact  . HLD (hyperlipidemia)   . HTN (hypertension)   . RLS (restless legs syndrome)    HOSPITAL COURSE:   Kathryn Ware a 75 y.o. femalewith a known history of Chronic anemia due to slow GI bleed secondary to AVM who had undergone last EGD in January 2017 with argon laser treatment for AVM-gastric, hypertension, hyperlipidemia comes through the emergency room after she started having dizziness for an weakness for last few days started noticing some black spots. Patient has these symptoms when she is anemic to the emergency room pump inhibitor 7.3  1. Symptom symptomatically anemia acute on chronic Due to AVM Patient's hemoglobin is stable. S/p 1 units PRBC transfusion.  2. Chronic anemia secondary to AV malformation in her stomach and duodenamlast EGD done by Dr. Candace Cruise in January 2017 with argon laser treatment -Continue iron pills -Avoid NSAIDs EGD and colonoscopy:  AVM of duodenum and a large  polyp in ascending colon which was removed and clip applied per Dr. Tiffany Kocher. Full liquid diet for 3 days.  3. Diabetes type 2 without complication Hypoglycemia overnight due to nothing by mouth, improved. On sliding scale.  4. Hyperlipidemia continuous statins  5. Hypertension, controlled with lisinopril and hydrochlorothiazide use when necessary hydralazine.  DISCHARGE CONDITIONS:  Stable, discharge to home today. CONSULTS OBTAINED:  Treatment Team:  Manya Silvas, MD DRUG ALLERGIES:   Allergies  Allergen Reactions  . Metformin     REACTION: diarrhea, patient aware that she is onmetformin when it is listed that she is allergic  . Nifedipine     REACTION: cough   DISCHARGE MEDICATIONS:     Medication List    TAKE these medications   clonazePAM 0.5 MG tablet Commonly known as:  KLONOPIN Take 0.5 mg by mouth at bedtime.   ferrous sulfate 325 (65 FE) MG tablet Take 325 mg by mouth daily with breakfast.   glipiZIDE 10 MG 24 hr tablet Commonly known as:  GLUCOTROL XL Take 1 tablet (10 mg total) by mouth daily with breakfast.   lisinopril-hydrochlorothiazide 20-12.5 MG tablet Commonly known as:  PRINZIDE,ZESTORETIC TAKE 2 TABLETS BY MOUTH DAILY   metFORMIN 500 MG 24 hr tablet Commonly known as:  GLUCOPHAGE-XR Take 500 mg by mouth daily with breakfast.   omeprazole 40 MG capsule Commonly known as:  PRILOSEC TAKE 1 CAPSULE(40 MG) BY MOUTH TWICE DAILY BEFORE A MEAL   ONE TOUCH ULTRA TEST test strip Generic  drug:  glucose blood USE AS DIRECTED ONCE DAILY   simvastatin 80 MG tablet Commonly known as:  ZOCOR TAKE 1 TABLET(80 MG) BY MOUTH DAILY   sucralfate 1 g tablet Commonly known as:  CARAFATE TAKE 1 TABLET BY MOUTH BEFORE MEALS AND EVERY NIGHT AT BEDTIME   tetanus & diphtheria toxoids (adult) 5-2 LFU injection Commonly known as:  TENIVAC Inject 0.5 mLs into the muscle once.   vitamin B-12 1000 MCG tablet Commonly known as:  CYANOCOBALAMIN Take 1,000  mcg by mouth daily.        DISCHARGE INSTRUCTIONS:   DIET:  Heart healthy and ADA diet. DISCHARGE CONDITION:  Stable. ACTIVITY:  As tolerated. DISCHARGE LOCATION:   If you experience worsening of your admission symptoms, develop shortness of breath, life threatening emergency, suicidal or homicidal thoughts you must seek medical attention immediately by calling 911 or calling your MD immediately  if symptoms less severe.  You Must read complete instructions/literature along with all the possible adverse reactions/side effects for all the Medicines you take and that have been prescribed to you. Take any new Medicines after you have completely understood and accpet all the possible adverse reactions/side effects.   Please note  You were cared for by a hospitalist during your hospital stay. If you have any questions about your discharge medications or the care you received while you were in the hospital after you are discharged, you can call the unit and asked to speak with the hospitalist on call if the hospitalist that took care of you is not available. Once you are discharged, your primary care physician will handle any further medical issues. Please note that NO REFILLS for any discharge medications will be authorized once you are discharged, as it is imperative that you return to your primary care physician (or establish a relationship with a primary care physician if you do not have one) for your aftercare needs so that they can reassess your need for medications and monitor your lab values.    On the day of Discharge:  VITAL SIGNS:  Blood pressure (!) 138/55, pulse 78, temperature 98 F (36.7 C), temperature source Oral, resp. rate 16, height '5\' 5"'$  (1.651 m), weight 119 lb 9.6 oz (54.3 kg), SpO2 96 %. PHYSICAL EXAMINATION:  GENERAL:  75 y.o.-year-old patient lying in the bed with no acute distress.  EYES: Pupils equal, round, reactive to light and accommodation. No scleral  icterus. Extraocular muscles intact.  HEENT: Head atraumatic, normocephalic. Oropharynx and nasopharynx clear.  NECK:  Supple, no jugular venous distention. No thyroid enlargement, no tenderness.  LUNGS: Normal breath sounds bilaterally, no wheezing, rales,rhonchi or crepitation. No use of accessory muscles of respiration.  CARDIOVASCULAR: S1, S2 normal. No murmurs, rubs, or gallops.  ABDOMEN: Soft, non-tender, non-distended. Bowel sounds present. No organomegaly or mass.  EXTREMITIES: No pedal edema, cyanosis, or clubbing.  NEUROLOGIC: Cranial nerves II through XII are intact. Muscle strength 5/5 in all extremities. Sensation intact. Gait not checked.  PSYCHIATRIC: The patient is alert and oriented x 3.  SKIN: No obvious rash, lesion, or ulcer.  DATA REVIEW:   CBC  Recent Labs Lab 07/07/16 0513 07/08/16 0833  WBC 6.9  --   HGB 9.5* 9.7*  HCT 27.9*  --   PLT 246  --     Chemistries   Recent Labs Lab 07/06/16 0444 07/07/16 0513  NA 140 139  K 4.2 3.8  CL 113* 111  CO2 22 23  GLUCOSE 65 126*  BUN 16  9  CREATININE 1.10* 0.94  CALCIUM 9.1 9.3  AST 19  --   ALT 11*  --   ALKPHOS 36*  --   BILITOT 0.4  --      Microbiology Results  No results found for this or any previous visit.  RADIOLOGY:  No results found.   Management plans discussed with the patient, family and they are in agreement.  CODE STATUS:     Code Status Orders        Start     Ordered   07/05/16 1301  Full code  Continuous     07/05/16 1301    Code Status History    Date Active Date Inactive Code Status Order ID Comments User Context   10/13/2015  8:53 PM 10/14/2015  4:36 PM Full Code [id number]  Dustin Flock, MD Inpatient   03/10/2015  9:31 PM 03/13/2015  1:11 PM Full Code [id number]  Theodoro Grist, MD Inpatient      TOTAL TIME TAKING CARE OF THIS PATIENT: 32 minutes.    Demetrios Loll M.D on 07/08/2016 at 9:39 AM  Between 7am to 6pm - Pager - [phone]  After 6pm go to  [email or web address] - Proofreader  [facility]  Office  [phone]  CC: Primary care physician; Viviana Simpler, MD   Note: This dictation was prepared with Dragon dictation along with smaller phrase technology. Any transcriptional errors that result from this process are unintentional.

## 2016-07-08 NOTE — Discharge Instructions (Signed)
Full liquid diet for 3 days, then Heart healthy and ADA diet. Activity as tolerated.

## 2016-07-08 NOTE — Progress Notes (Signed)
Discharge paperwork reviewed with patient who verbalized understanding. No changes to medications. Patient family to transport home.

## 2016-07-11 ENCOUNTER — Telehealth: Payer: Self-pay

## 2016-07-11 LAB — SURGICAL PATHOLOGY

## 2016-07-11 NOTE — Telephone Encounter (Signed)
Transition Care Management Follow-up Telephone Call    Date discharged? 07/08/2016  How have you been since you were released from the hospital? [place].   Any patient concerns? None at this time    Items Reviewed:  Medications reviewed: Yes  Allergies reviewed: Yes  Dietary changes reviewed: No  Referrals reviewed: Yes   Functional Questionnaire:  Independent - I Dependent - D    Activities of Daily Living (ADLs):    Personal hygiene - I Dressing - I Eating - I Maintaining continence - I Transferring - I   Independent Activities of Daily Living (iADLs): Basic communication skills - I Transportation - I Meal preparation  - I Shopping - I Housework - I  Managing medications - I  Managing personal finances - I   Confirmed importance and date/time of follow-up visits scheduled YES  Provider Appointment booked with PCP 07/20/16 @ 1200  Confirmed with patient if condition begins to worsen call PCP or go to the ER.  Patient was given the office number and encouraged to call back with question or concerns: YES

## 2016-07-18 ENCOUNTER — Other Ambulatory Visit: Payer: Self-pay | Admitting: Internal Medicine

## 2016-07-20 ENCOUNTER — Encounter: Payer: Self-pay | Admitting: Internal Medicine

## 2016-07-20 ENCOUNTER — Ambulatory Visit (INDEPENDENT_AMBULATORY_CARE_PROVIDER_SITE_OTHER): Payer: Medicare Other | Admitting: Internal Medicine

## 2016-07-20 ENCOUNTER — Telehealth: Payer: Self-pay | Admitting: *Deleted

## 2016-07-20 VITALS — BP 96/50 | HR 93 | Temp 98.1°F | Wt 118.0 lb

## 2016-07-20 DIAGNOSIS — I10 Essential (primary) hypertension: Secondary | ICD-10-CM | POA: Diagnosis not present

## 2016-07-20 DIAGNOSIS — K31819 Angiodysplasia of stomach and duodenum without bleeding: Secondary | ICD-10-CM

## 2016-07-20 DIAGNOSIS — Z23 Encounter for immunization: Secondary | ICD-10-CM

## 2016-07-20 DIAGNOSIS — K31811 Angiodysplasia of stomach and duodenum with bleeding: Secondary | ICD-10-CM

## 2016-07-20 DIAGNOSIS — E119 Type 2 diabetes mellitus without complications: Secondary | ICD-10-CM

## 2016-07-20 DIAGNOSIS — Q2733 Arteriovenous malformation of digestive system vessel: Secondary | ICD-10-CM

## 2016-07-20 DIAGNOSIS — D500 Iron deficiency anemia secondary to blood loss (chronic): Secondary | ICD-10-CM | POA: Diagnosis not present

## 2016-07-20 DIAGNOSIS — Z780 Asymptomatic menopausal state: Secondary | ICD-10-CM

## 2016-07-20 LAB — CBC WITH DIFFERENTIAL/PLATELET
Basophils Absolute: 0 K/uL (ref 0.0–0.1)
Basophils Relative: 0.4 % (ref 0.0–3.0)
Eosinophils Absolute: 0.1 K/uL (ref 0.0–0.7)
Eosinophils Relative: 0.7 % (ref 0.0–5.0)
HCT: 23.8 % — CL (ref 36.0–46.0)
Hemoglobin: 7.7 g/dL — CL (ref 12.0–15.0)
Lymphocytes Relative: 17.5 % (ref 12.0–46.0)
Lymphs Abs: 1.2 K/uL (ref 0.7–4.0)
MCHC: 32.4 g/dL (ref 30.0–36.0)
MCV: 83.9 fl (ref 78.0–100.0)
Monocytes Absolute: 0.6 K/uL (ref 0.1–1.0)
Monocytes Relative: 8.2 % (ref 3.0–12.0)
Neutro Abs: 5.2 K/uL (ref 1.4–7.7)
Neutrophils Relative %: 73.2 % (ref 43.0–77.0)
Platelets: 281 K/uL (ref 150.0–400.0)
RBC: 2.83 Mil/uL — ABNORMAL LOW (ref 3.87–5.11)
RDW: 16.4 % — ABNORMAL HIGH (ref 11.5–15.5)
WBC: 7.1 K/uL (ref 4.0–10.5)

## 2016-07-20 LAB — HEMOGLOBIN A1C: Hgb A1c MFr Bld: 6.1 % (ref 4.6–6.5)

## 2016-07-20 MED ORDER — TETANUS-DIPHTHERIA TOXOIDS TD 5-2 LFU IM INJ: 0.5000 mL | INJECTION | Freq: Once | INTRAMUSCULAR | 0 refills | Status: AC

## 2016-07-20 MED ORDER — LISINOPRIL-HYDROCHLOROTHIAZIDE 20-12.5 MG PO TABS: 1.0000 | ORAL_TABLET | Freq: Every day | ORAL | 0 refills | Status: DC

## 2016-07-20 NOTE — Progress Notes (Signed)
Pre visit review using our clinic review tool, if applicable. No additional management support is needed unless otherwise documented below in the visit note. 

## 2016-07-20 NOTE — Telephone Encounter (Signed)
Results faxed to Dr. Tiffany Kocher per Dr. Glori Bickers.

## 2016-07-20 NOTE — Assessment & Plan Note (Signed)
Sugars on low side If A1c still good--will stop glipizide (in case it is causing her to feel bad)

## 2016-07-20 NOTE — Telephone Encounter (Signed)
She has a hx of recent GI bleed from AVM of stomach/duodenum from record review.  Please call and ask how she is feeling,  Any blood in her stool, dark stool (also ask if she is on iron), or abdominal pain or dizziness?

## 2016-07-20 NOTE — Assessment & Plan Note (Signed)
This appears to be chronic but then got worse Will recheck labs Stays on iron all the time

## 2016-07-20 NOTE — Assessment & Plan Note (Signed)
With recurrent GI bleed Recent procedure

## 2016-07-20 NOTE — Addendum Note (Signed)
Addended by: Pilar Grammes on: 07/20/2016 03:39 PM   Modules accepted: Orders

## 2016-07-20 NOTE — Assessment & Plan Note (Signed)
BP Readings from Last 3 Encounters:  07/20/16 (!) 96/50  07/08/16 (!) 138/55  02/23/16 136/66   Repeat 132/44 Will try decreasing BP med for now

## 2016-07-20 NOTE — Telephone Encounter (Addendum)
Critical labs called from [facility] reported to Dr. Silvio Pate via phone note and to Dr. Glori Bickers who is in the office Hgb=7.7 Hct=23.8

## 2016-07-20 NOTE — Progress Notes (Signed)
Subjective:    Patient ID: Kathryn Ware, female    DOB: [birth year], 75 y.o.   MRN: [id number]  HPI Here for follow up of hospitalization for recurrent GI bleed  Not really feeling better Still feels tired ---even after getting up in AM No apparent bleeding now  Had 5 angioectasias in duodenum--treated by Dr Vira Agar Colon polyp--not likely related to anemia (though was 99m)  No SOB No chest pain Occasional headache No edema Frequently feels dizzy when getting up  Checks sugars---stays around 100 in AM She is concerned that this may be related to her "feeling bad"  Current Outpatient Prescriptions on File Prior to Visit  Medication Sig Dispense Refill  . clonazePAM (KLONOPIN) 0.5 MG tablet Take 0.5 mg by mouth daily as needed.     . ferrous sulfate 325 (65 FE) MG tablet Take 325 mg by mouth 2 (two) times daily.     .Marland KitchenglipiZIDE (GLUCOTROL XL) 10 MG 24 hr tablet Take 1 tablet (10 mg total) by mouth daily with breakfast. 1 tablet 0  . lisinopril-hydrochlorothiazide (PRINZIDE,ZESTORETIC) 20-12.5 MG tablet TAKE 2 TABLETS BY MOUTH DAILY 60 tablet 11  . metFORMIN (GLUCOPHAGE-XR) 500 MG 24 hr tablet Take 500 mg by mouth daily with breakfast.    . omeprazole (PRILOSEC) 40 MG capsule TAKE 1 CAPSULE(40 MG) BY MOUTH TWICE DAILY BEFORE A MEAL 180 capsule 3  . ONE TOUCH ULTRA TEST test strip USE AS DIRECTED ONCE DAILY 100 each 3  . simvastatin (ZOCOR) 80 MG tablet TAKE 1 TABLET(80 MG) BY MOUTH DAILY 90 tablet 3  . sucralfate (CARAFATE) 1 g tablet TAKE 1 TABLET BY MOUTH EVERY NIGHT BEFORE MEALS AND AT BEDTIME 120 tablet 0  . vitamin B-12 (CYANOCOBALAMIN) 1000 MCG tablet Take 1,000 mcg by mouth daily.     No current facility-administered medications on file prior to visit.     Allergies  Allergen Reactions  . Metformin     REACTION: diarrhea when dosage is increased, patient aware that she is onmetformin when it is listed that she is allergic  . Nifedipine     REACTION: cough    Past  Medical History:  Diagnosis Date  . Blood transfusion without reported diagnosis   . Diabetes mellitus type II   . GI AVM (gastrointestinal arteriovenous vascular malformation)    stomach  . History of echocardiogram 3/15   a. 12/2013: EF 60-65%, DD, mild LVH, nl RV size & systolic function, mild MR/TR, mild AS, nl RVSP  . History of nuclear stress test    a. 12/2013: low risk, no sig WMA, nondiag EKG 2/2 baseline LVH w/ repol abnl, no sig ischemia, EF 70% no artifact  . HLD (hyperlipidemia)   . HTN (hypertension)   . RLS (restless legs syndrome)     Past Surgical History:  Procedure Laterality Date  . ABDOMINAL ADHESION SURGERY  11/2001   Dr. STamala Julian . ABDOMINAL HYSTERECTOMY  1976   RSO (fibroid)  . COLONOSCOPY WITH PROPOFOL N/A 07/07/2016   Procedure: COLONOSCOPY WITH PROPOFOL;  Surgeon: RManya Silvas MD;  [facility]  Service: Endoscopy;  Laterality: N/A;  . DOBUTAMINE STRESS ECHO  11/10   normal  . DOPPLER ECHOCARDIOGRAPHY     EF 55%, mild MR, TR  . ESOPHAGOGASTRODUODENOSCOPY  07/2006   multiple angioectasias  . ESOPHAGOGASTRODUODENOSCOPY Left 03/12/2015   Procedure: place tube in throat and evaluate stomach and duodenum for source of bleeding. Cauterize if concern for rebleeding.;  Surgeon: PHulen Luster  MD;  Location: [facility] ENDOSCOPY;  Service: Endoscopy;  Laterality: Left;  . ESOPHAGOGASTRODUODENOSCOPY (EGD) WITH PROPOFOL N/A 11/23/2015   Procedure: ESOPHAGOGASTRODUODENOSCOPY (EGD) WITH PROPOFOL;  Surgeon: Hulen Luster, MD;  Location: [facility] ENDOSCOPY;  Service: Gastroenterology;  Laterality: N/A;  . ESOPHAGOGASTRODUODENOSCOPY (EGD) WITH PROPOFOL N/A 07/07/2016   Procedure: ESOPHAGOGASTRODUODENOSCOPY (EGD) WITH PROPOFOL;  Surgeon: Manya Silvas, MD;  Location: [facility] ENDOSCOPY;  Service: Endoscopy;  Laterality: N/A;  . laryngeal polyp  10/2008   Dr. Tami Ribas  . TOP      Family History  Problem Relation Age of Onset  . Stroke Father   . Cancer Sister     breast  cancer  . Hypertension Mother   . Hypertension      sibling  . Diabetes      grandmother    Social History   Social History  . Marital status: Widowed    Spouse name: N/A  . Number of children: 1  . Years of education: N/A   Occupational History  . 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 Smoking status: Current Every Day Smoker    Packs/day: 1.00    Types: Cigarettes  . Smokeless tobacco: Never Used  . Alcohol use No  . Drug use: No  . Sexual activity: Not on file   Other Topics Concern  . Not on file   Social History Narrative   No living will   Requests son Orson Gear as health care POA   Would accept resuscitation    Not sure about feeding tubes   Review of Systems Appetite is not great Weight down a couple of pounds Not sleeping well Has cut down on smoking--discussed trying to stop    Objective:   Physical Exam  Constitutional: She appears well-developed and well-nourished. No distress.  Neck: Normal range of motion. No thyromegaly present.  Cardiovascular: Normal rate.  Exam reveals no gallop.   Stable gr 3/6 aortic systolic murmur  Pulmonary/Chest: Effort normal and breath sounds normal. No respiratory distress. She has no wheezes. She has no rales.  Abdominal: Soft. There is no tenderness.  Musculoskeletal: She exhibits no edema.  Lymphadenopathy:    She has no cervical adenopathy.  Psychiatric: She has a normal mood and affect. Her behavior is normal.          Assessment & Plan:

## 2016-07-20 NOTE — Telephone Encounter (Signed)
Spoke to pt and she states that she has not noticed blood in her stool and has not been dark..the patient also denies any other Sx....please advise

## 2016-07-20 NOTE — Telephone Encounter (Signed)
If she develops any acute dizziness or weakness -please call back or go to ED if severe.  Your hemoglobin is down again indicating that you may still have GI bleeding.  I will cc to Dr Silvio Pate for his attention when he returns to the office tomorrow -thanks

## 2016-07-20 NOTE — Patient Instructions (Addendum)
Please set up your screening mammogram. Go to the pharmacy for your tetanus booster. Decrease the lisinopril to 1 tab daily

## 2016-07-21 NOTE — Telephone Encounter (Signed)
She will be home after 3-3:15PM today

## 2016-07-21 NOTE — Telephone Encounter (Signed)
Spoke to sister. She is at [facility] not feeling great. Set up repeat hemoglobin tomorrow or Monday to be sure it is not going down any more. ER if any active bleeding Tell her that diabetes control is very good---stop the glipizide like we discussed

## 2016-07-21 NOTE — Telephone Encounter (Signed)
Spoke to pt. Made lab appt for tomorrow morning

## 2016-07-22 ENCOUNTER — Other Ambulatory Visit (INDEPENDENT_AMBULATORY_CARE_PROVIDER_SITE_OTHER): Payer: Medicare Other

## 2016-07-22 DIAGNOSIS — K31811 Angiodysplasia of stomach and duodenum with bleeding: Secondary | ICD-10-CM

## 2016-07-22 LAB — HEMOGLOBIN

## 2016-08-02 ENCOUNTER — Other Ambulatory Visit: Payer: Self-pay

## 2016-08-02 NOTE — Telephone Encounter (Signed)
Approved: okay #[id number] x 0

## 2016-08-02 NOTE — Telephone Encounter (Signed)
Pt left v/m requesting refill clonazepam to [facility]. Pt has not had med filled in long time. Pt last seen 07/20/16. Please advise.

## 2016-08-03 MED ORDER — CLONAZEPAM 0.5 MG PO TABS: 0.5000 mg | ORAL_TABLET | Freq: Every day | ORAL | 0 refills | Status: DC | PRN

## 2016-08-03 NOTE — Telephone Encounter (Signed)
Left refill on voice mail at pharmacy  

## 2016-09-19 DIAGNOSIS — Z86010 Personal history of colonic polyps: Secondary | ICD-10-CM | POA: Diagnosis not present

## 2016-09-19 DIAGNOSIS — Z860101 Personal history of adenomatous and serrated colon polyps: Secondary | ICD-10-CM | POA: Insufficient documentation

## 2016-09-19 DIAGNOSIS — D500 Iron deficiency anemia secondary to blood loss (chronic): Secondary | ICD-10-CM | POA: Diagnosis not present

## 2016-09-19 DIAGNOSIS — K5520 Angiodysplasia of colon without hemorrhage: Secondary | ICD-10-CM | POA: Diagnosis not present

## 2016-10-29 ENCOUNTER — Other Ambulatory Visit: Payer: Self-pay | Admitting: Internal Medicine

## 2016-11-15 DIAGNOSIS — K5520 Angiodysplasia of colon without hemorrhage: Secondary | ICD-10-CM | POA: Diagnosis not present

## 2016-11-15 DIAGNOSIS — D500 Iron deficiency anemia secondary to blood loss (chronic): Secondary | ICD-10-CM | POA: Diagnosis not present

## 2016-12-14 ENCOUNTER — Ambulatory Visit: Payer: Medicare Other | Admitting: Anesthesiology

## 2016-12-14 ENCOUNTER — Encounter
Admission: RE | Disposition: A | Discharge status: Home / Self Care | Payer: Self-pay | Source: Ambulatory Visit | Attending: Unknown Physician Specialty

## 2016-12-14 ENCOUNTER — Ambulatory Visit
Admission: RE | Admit: 2016-12-14 | Discharge: 2016-12-14 | Disposition: A | Discharge status: Home / Self Care | Payer: Medicare Other | Source: Ambulatory Visit | Attending: Unknown Physician Specialty | Admitting: Unknown Physician Specialty

## 2016-12-14 ENCOUNTER — Encounter: Payer: Self-pay | Admitting: *Deleted

## 2016-12-14 DIAGNOSIS — D122 Benign neoplasm of ascending colon: Secondary | ICD-10-CM | POA: Diagnosis not present

## 2016-12-14 DIAGNOSIS — K2980 Duodenitis without bleeding: Secondary | ICD-10-CM | POA: Insufficient documentation

## 2016-12-14 DIAGNOSIS — Z1211 Encounter for screening for malignant neoplasm of colon: Secondary | ICD-10-CM | POA: Insufficient documentation

## 2016-12-14 DIAGNOSIS — D500 Iron deficiency anemia secondary to blood loss (chronic): Secondary | ICD-10-CM | POA: Insufficient documentation

## 2016-12-14 DIAGNOSIS — E119 Type 2 diabetes mellitus without complications: Secondary | ICD-10-CM | POA: Diagnosis not present

## 2016-12-14 DIAGNOSIS — G2581 Restless legs syndrome: Secondary | ICD-10-CM | POA: Insufficient documentation

## 2016-12-14 DIAGNOSIS — K5790 Diverticulosis of intestine, part unspecified, without perforation or abscess without bleeding: Secondary | ICD-10-CM | POA: Diagnosis not present

## 2016-12-14 DIAGNOSIS — K648 Other hemorrhoids: Secondary | ICD-10-CM | POA: Diagnosis not present

## 2016-12-14 DIAGNOSIS — K640 First degree hemorrhoids: Secondary | ICD-10-CM | POA: Diagnosis not present

## 2016-12-14 DIAGNOSIS — K317 Polyp of stomach and duodenum: Secondary | ICD-10-CM | POA: Insufficient documentation

## 2016-12-14 DIAGNOSIS — I10 Essential (primary) hypertension: Secondary | ICD-10-CM | POA: Insufficient documentation

## 2016-12-14 DIAGNOSIS — D123 Benign neoplasm of transverse colon: Secondary | ICD-10-CM | POA: Diagnosis not present

## 2016-12-14 DIAGNOSIS — Z7984 Long term (current) use of oral hypoglycemic drugs: Secondary | ICD-10-CM | POA: Diagnosis not present

## 2016-12-14 DIAGNOSIS — E785 Hyperlipidemia, unspecified: Secondary | ICD-10-CM | POA: Insufficient documentation

## 2016-12-14 DIAGNOSIS — Q2733 Arteriovenous malformation of digestive system vessel: Secondary | ICD-10-CM | POA: Diagnosis not present

## 2016-12-14 DIAGNOSIS — K3189 Other diseases of stomach and duodenum: Secondary | ICD-10-CM | POA: Diagnosis not present

## 2016-12-14 DIAGNOSIS — K31819 Angiodysplasia of stomach and duodenum without bleeding: Secondary | ICD-10-CM | POA: Insufficient documentation

## 2016-12-14 DIAGNOSIS — F17210 Nicotine dependence, cigarettes, uncomplicated: Secondary | ICD-10-CM | POA: Diagnosis not present

## 2016-12-14 DIAGNOSIS — Z86010 Personal history of colonic polyps: Secondary | ICD-10-CM | POA: Diagnosis not present

## 2016-12-14 DIAGNOSIS — D132 Benign neoplasm of duodenum: Secondary | ICD-10-CM | POA: Diagnosis not present

## 2016-12-14 DIAGNOSIS — D509 Iron deficiency anemia, unspecified: Secondary | ICD-10-CM | POA: Diagnosis not present

## 2016-12-14 HISTORY — DX: Anemia, unspecified: D64.9

## 2016-12-14 HISTORY — PX: COLONOSCOPY WITH PROPOFOL: SHX5780

## 2016-12-14 HISTORY — PX: ESOPHAGOGASTRODUODENOSCOPY: SHX5428

## 2016-12-14 LAB — GLUCOSE, CAPILLARY: Glucose-Capillary: 93 mg/dL (ref 65–99)

## 2016-12-14 SURGERY — EGD (ESOPHAGOGASTRODUODENOSCOPY)
Anesthesia: General

## 2016-12-14 MED ORDER — PROPOFOL 500 MG/50ML IV EMUL
INTRAVENOUS | Status: DC | PRN
  Administered 2016-12-14: 100 ug/kg/min via INTRAVENOUS

## 2016-12-14 MED ORDER — EPHEDRINE SULFATE 50 MG/ML IJ SOLN
INTRAMUSCULAR | Status: DC | PRN
  Administered 2016-12-14: 5 mg via INTRAVENOUS

## 2016-12-14 MED ORDER — LIDOCAINE HCL (PF) 2 % IJ SOLN
INTRAMUSCULAR | Status: AC
  Filled 2016-12-14: qty 2

## 2016-12-14 MED ORDER — PROPOFOL 500 MG/50ML IV EMUL
INTRAVENOUS | Status: AC
  Filled 2016-12-14: qty 50

## 2016-12-14 MED ORDER — MIDAZOLAM HCL 2 MG/2ML IJ SOLN
INTRAMUSCULAR | Status: AC
  Filled 2016-12-14: qty 2

## 2016-12-14 MED ORDER — SODIUM CHLORIDE 0.9 % IV SOLN
INTRAVENOUS | Status: DC
  Administered 2016-12-14: 09:00:00 via INTRAVENOUS

## 2016-12-14 MED ORDER — GLYCOPYRROLATE 0.2 MG/ML IJ SOLN
INTRAMUSCULAR | Status: DC | PRN
  Administered 2016-12-14: 0.1 mg via INTRAVENOUS

## 2016-12-14 MED ORDER — FENTANYL CITRATE (PF) 100 MCG/2ML IJ SOLN
INTRAMUSCULAR | Status: AC
  Filled 2016-12-14: qty 2

## 2016-12-14 MED ORDER — GLYCOPYRROLATE 0.2 MG/ML IJ SOLN
INTRAMUSCULAR | Status: AC
  Filled 2016-12-14: qty 1

## 2016-12-14 MED ORDER — LIDOCAINE HCL (CARDIAC) 20 MG/ML IV SOLN
INTRAVENOUS | Status: DC | PRN
  Administered 2016-12-14: 30 mg via INTRAVENOUS

## 2016-12-14 MED ORDER — MIDAZOLAM HCL 2 MG/2ML IJ SOLN
INTRAMUSCULAR | Status: DC | PRN
  Administered 2016-12-14: 1 mg via INTRAVENOUS

## 2016-12-14 MED ORDER — FENTANYL CITRATE (PF) 100 MCG/2ML IJ SOLN
INTRAMUSCULAR | Status: DC | PRN
  Administered 2016-12-14: 50 ug via INTRAVENOUS

## 2016-12-14 NOTE — Anesthesia Post-op Follow-up Note (Cosign Needed)
Anesthesia QCDR form completed.        

## 2016-12-14 NOTE — H&P (Signed)
Primary Care Physician:  Viviana Simpler, MD Primary Gastroenterologist:  Dr. Vira Agar  Pre-Procedure History & Physical: HPI:  Kathryn Ware is a 76 y.o. female is here for an endoscopy and colonoscopy.   Past Medical History:  Diagnosis Date  . Anemia   . Blood transfusion without reported diagnosis   . Diabetes mellitus type II   . GI AVM (gastrointestinal arteriovenous vascular malformation)    stomach  . History of echocardiogram 3/15   a. 12/2013: EF 60-65%, DD, mild LVH, nl RV size & systolic function, mild MR/TR, mild AS, nl RVSP  . History of nuclear stress test    a. 12/2013: low risk, no sig WMA, nondiag EKG 2/2 baseline LVH w/ repol abnl, no sig ischemia, EF 70% no artifact  . HLD (hyperlipidemia)   . HTN (hypertension)   . RLS (restless legs syndrome)     Past Surgical History:  Procedure Laterality Date  . ABDOMINAL ADHESION SURGERY  11/2001   Dr. Tamala Julian  . ABDOMINAL HYSTERECTOMY  1976   RSO (fibroid)  . COLONOSCOPY WITH PROPOFOL N/A 07/07/2016   Procedure: COLONOSCOPY WITH PROPOFOL;  Surgeon: Manya Silvas, MD;  Location: [facility] END[facility]COPY;  Service: Endoscopy;  Laterality: N/A;  . DOBUTAMINE STRESS ECHO  11/10   normal  . DOPPLER ECHOCARDIOGRAPHY     EF 55%, mild MR, TR  . ESOPHAGOGASTRODUODENOSCOPY  07/2006   multiple angioectasias  . ESOPHAGOGASTRODUODENOSCOPY Left 03/12/2015   Procedure: place tube in throat and evaluate stomach and duodenum for source of bleeding. Cauterize if concern for rebleeding.;  Surgeon: Hulen Luster, MD;  Location: [facility] ENDOSCOPY;  Service: Endoscopy;  Laterality: Left;  . ESOPHAGOGASTRODUODENOSCOPY (EGD) WITH PROPOFOL N/A 11/23/2015   Procedure: ESOPHAGOGASTRODUODENOSCOPY (EGD) WITH PROPOFOL;  Surgeon: Hulen Luster, MD;  Location: [facility] ENDOSCOPY;  Service: Gastroenterology;  Laterality: N/A;  . ESOPHAGOGASTRODUODENOSCOPY (EGD) WITH PROPOFOL N/A 07/07/2016   Procedure: ESOPHAGOGASTRODUODENOSCOPY (EGD) WITH PROPOFOL;  Surgeon: Manya Silvas,  MD;  Location: [facility] ENDOSCOPY;  Service: Endoscopy;  Laterality: N/A;  . laryngeal polyp  10/2008   Dr. Tami Ribas  . TOP      Prior to Admission medications   Medication Sig Start Date End Date Taking? Authorizing Provider  ferrous sulfate 325 (65 FE) MG tablet Take 325 mg by mouth 2 (two) times daily.    Yes Historical Provider, MD  lisinopril-hydrochlorothiazide (PRINZIDE,ZESTORETIC) 20-12.5 MG tablet Take 1 tablet by mouth daily. 07/20/16  Yes Venia Carbon, MD  metFORMIN (GLUCOPHAGE-XR) 500 MG 24 hr tablet TAKE 1 TABLET BY MOUTH TWICE DAILY 10/31/16  Yes Venia Carbon, MD  omeprazole (PRILOSEC) 40 MG capsule TAKE 1 CAPSULE(40 MG) BY MOUTH TWICE DAILY BEFORE A MEAL 04/28/16  Yes Venia Carbon, MD  simvastatin (ZOCOR) 80 MG tablet TAKE 1 TABLET(80 MG) BY MOUTH DAILY 04/27/16  Yes Venia Carbon, MD  sucralfate (CARAFATE) 1 g tablet TAKE 1 TABLET BY MOUTH EVERY NIGHT BEFORE MEALS AND AT BEDTIME 07/19/16  Yes Venia Carbon, MD  vitamin B-12 (CYANOCOBALAMIN) 1000 MCG tablet Take 1,000 mcg by mouth daily.   Yes Historical Provider, MD  clonazePAM (KLONOPIN) 0.5 MG tablet Take 1 tablet (0.5 mg total) by mouth daily as needed. 08/03/16   Venia Carbon, MD  metFORMIN (GLUCOPHAGE-XR) 500 MG 24 hr tablet Take 500 mg by mouth daily with breakfast.    Historical Provider, MD  ONE TOUCH ULTRA TEST test strip USE AS DIRECTED ONCE DAILY 05/31/16   Venia Carbon, MD  Allergies as of 11/18/2016 - Review Complete 07/20/2016  Allergen Reaction Noted  . Metformin  09/05/2006  . Nifedipine  09/05/2006    Family History  Problem Relation Age of Onset  . Stroke Father   . Cancer Sister     breast cancer  . Hypertension Mother   . Hypertension      sibling  . Diabetes      grandmother    Social History   Social History  . Marital status: Widowed    Spouse name: N/A  . Number of children: 1  . Years of education: N/A   Occupational History  . 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 Smoking status: Current Every Day Smoker    Packs/day: 1.00    Types: Cigarettes  . Smokeless tobacco: Never Used  . Alcohol use No  . Drug use: No  . Sexual activity: Not on file   Other Topics Concern  . Not on file   Social History Narrative   No living will   Requests son Orson Gear as health care POA   Would accept resuscitation    Not sure about feeding tubes    Review of Systems: See HPI, otherwise negative ROS  Physical Exam: BP (!) 175/73   Pulse 94   Temp (!) 96.7 F (35.9 C) (Tympanic)   Resp 14   Ht '5\' 5"'$  (1.651 m)   Wt 54.9 kg (121 lb)   SpO2 (!) 14%   BMI 20.14 kg/m  General:   Alert,  pleasant and cooperative in NAD Head:  Normocephalic and atraumatic. Neck:  Supple; no masses or thyromegaly. Lungs:  Clear throughout to auscultation.    Heart:  Regular rate and rhythm. Abdomen:  Soft, nontender and nondistended. Normal bowel sounds, without guarding, and without rebound.   Neurologic:  Alert and  oriented x4;  grossly normal neurologically.  Impression/Plan: Kathryn Ware is here for an endoscopy and colonoscopy to be performed for AVM of small bowel, iron def anemia, follow up previous polyp  Risks, benefits, limitations, and alternatives regarding  endoscopy and colonoscopy have been reviewed with the patient.  Questions have been answered.  All parties agreeable.   Gaylyn Cheers, MD  12/14/2016, 9:15 AM

## 2016-12-14 NOTE — Anesthesia Procedure Notes (Signed)
Performed by: COOK-MARTIN, Raynald Rouillard Pre-anesthesia Checklist: Patient identified, Emergency Drugs available, Suction available, Patient being monitored and Timeout performed Patient Re-evaluated:Patient Re-evaluated prior to inductionOxygen Delivery Method: Nasal cannula Preoxygenation: Pre-oxygenation with 100% oxygen Intubation Type: IV induction Airway Equipment and Method: Bite block Placement Confirmation: CO2 detector and positive ETCO2     

## 2016-12-14 NOTE — Transfer of Care (Signed)
Immediate Anesthesia Transfer of Care Note  Patient: Kathryn Ware  Procedure(s) Performed: Procedure(s): ESOPHAGOGASTRODUODENOSCOPY (EGD) (N/A) COLONOSCOPY WITH PROPOFOL (N/A)  Patient Location: PACU  Anesthesia Type:General  Level of Consciousness: awake and sedated  Airway & Oxygen Therapy: Patient Spontanous Breathing and Patient connected to nasal cannula oxygen  Post-op Assessment: Report given to RN and Post -op Vital signs reviewed and stable  Post vital signs: Reviewed and stable  Last Vitals:  Vitals:   12/14/16 0840 12/14/16 1022  BP: (!) 175/73 (!) 107/59  Pulse: 94 (!) 105  Resp: 14 16  Temp: (!) 35.9 C 36.2 C    Last Pain:  Vitals:   12/14/16 1022  TempSrc: Tympanic         Complications: No apparent anesthesia complications

## 2016-12-14 NOTE — Op Note (Signed)
[facility] Gastroenterology Patient Name: Kathryn Ware Procedure Date: 12/14/2016 9:22 AM MRN: [id number] Account #: [id number] Date of Birth: February 10, 1941 Admit Type: Outpatient Age: 76 Room: [facility] ENDO ROOM 4 Gender: Female Note Status: Finalized Procedure:            Colonoscopy Indications:          High risk colon cancer surveillance: Personal history                        of colonic polyps Providers:            Manya Silvas, MD Referring MD:         Venia Carbon (Referring MD) Medicines:            Propofol per Anesthesia Complications:        No immediate complications. Procedure:            Pre-Anesthesia Assessment:                       - After reviewing the risks and benefits, the patient                        was deemed in satisfactory condition to undergo the                        procedure.                       After obtaining informed consent, the colonoscope was                        passed under direct vision. Throughout the procedure,                        the patient's blood pressure, pulse, and oxygen                        saturations were monitored continuously. The                        Colonoscope was introduced through the anus and                        advanced to the the ascending colon. The colonoscopy                        was somewhat difficult due to a tortuous colon.                        Successful completion of the procedure was aided by                        changing endoscopes. Findings:      The colonoscope would not pass into the mid sigmoid and I switched to an       EGD scope and passed to the mid ascending colon and could see the cecum       well.      Two sessile polyps were found in the hepatic flexure and ascending       colon. The polyps were small in size. These polyps were removed with a  hot snare. Resection and retrieval were complete.      Internal hemorrhoids were found during endoscopy.  The hemorrhoids were       small and Grade I (internal hemorrhoids that do not prolapse).      The exam was otherwise without abnormality. Impression:           - Two small polyps at the hepatic flexure and in the                        ascending colon, removed with a hot snare. Resected and                        retrieved.                       - Internal hemorrhoids.                       - The examination was otherwise normal. Recommendation:       - Await pathology results. Manya Silvas, MD 12/14/2016 10:29:19 AM This report has been signed electronically. Number of Addenda: 0 Note Initiated On: 12/14/2016 9:22 AM Scope Withdrawal Time: 0 hours 5 minutes 30 seconds  Total Procedure Duration: 0 hours 32 minutes 28 seconds       [facility]

## 2016-12-14 NOTE — Anesthesia Preprocedure Evaluation (Signed)
Anesthesia Evaluation  Patient identified by MRN, date of birth, ID band Patient awake    Reviewed: Allergy & Precautions, H&P , NPO status , Patient's Chart, lab work & pertinent test results, reviewed documented beta blocker date and time   History of Anesthesia Complications Negative for: history of anesthetic complications  Airway Mallampati: II  TM Distance: >3 FB Neck ROM: full    Dental  (+) Edentulous Upper, Edentulous Lower, Upper Dentures, Lower Dentures   Pulmonary neg shortness of breath, neg sleep apnea, neg COPD, Recent URI , Resolved, Current Smoker,           Cardiovascular Exercise Tolerance: Good hypertension, (-) angina(-) CAD, (-) Past MI, (-) Cardiac Stents and (-) CABG (-) dysrhythmias + Valvular Problems/Murmurs      Neuro/Psych negative neurological ROS  negative psych ROS   GI/Hepatic negative GI ROS, Neg liver ROS,   Endo/Other  diabetes  Renal/GU negative Renal ROS  negative genitourinary   Musculoskeletal   Abdominal   Peds  Hematology  (+) Blood dyscrasia, anemia ,   Anesthesia Other Findings Past Medical History: No date: Anemia No date: Blood transfusion without reported diagnosis No date: Diabetes mellitus type II No date: GI AVM (gastrointestinal arteriovenous vascula*     Comment: stomach 3/15: History of echocardiogram     Comment: a. 12/2013: EF 60-65%, DD, mild LVH, nl RV size              & systolic function, mild MR/TR, mild AS, nl               RVSP No date: History of nuclear stress test     Comment: a. 12/2013: low risk, no sig WMA, nondiag EKG               2/2 baseline LVH w/ repol abnl, no sig               ischemia, EF 70% no artifact No date: HLD (hyperlipidemia) No date: HTN (hypertension) No date: RLS (restless legs syndrome)   Reproductive/Obstetrics negative OB ROS                             Anesthesia Physical Anesthesia  Plan  ASA: II  Anesthesia Plan: General   Post-op Pain Management:    Induction:   Airway Management Planned:   Additional Equipment:   Intra-op Plan:   Post-operative Plan:   Informed Consent: I have reviewed the patients History and Physical, chart, labs and discussed the procedure including the risks, benefits and alternatives for the proposed anesthesia with the patient or authorized representative who has indicated his/her understanding and acceptance.   Dental Advisory Given  Plan Discussed with: Anesthesiologist, CRNA and Surgeon  Anesthesia Plan Comments:         Anesthesia Quick Evaluation

## 2016-12-14 NOTE — Op Note (Signed)
[facility] Gastroenterology Patient Name: Kathryn Ware Procedure Date: 12/14/2016 9:22 AM MRN: [id number] Account #: [id number] Date of Birth: October 13, 1941 Admit Type: Outpatient Age: 76 Room: [facility] ENDO ROOM 4 Gender: Female Note Status: Finalized Procedure:            Upper GI endoscopy Indications:          Unexplained iron deficiency anemia Providers:            Manya Silvas, MD Referring MD:         Venia Carbon (Referring MD) Medicines:            Propofol per Anesthesia Complications:        No immediate complications. Procedure:            Pre-Anesthesia Assessment:                       - After reviewing the risks and benefits, the patient                        was deemed in satisfactory condition to undergo the                        procedure.                       After obtaining informed consent, the endoscope was                        passed under direct vision. Throughout the procedure,                        the patient's blood pressure, pulse, and oxygen                        saturations were monitored continuously. The Endoscope                        was introduced through the mouth, and advanced to the                        second part of duodenum. The upper GI endoscopy was                        accomplished without difficulty. The patient tolerated                        the procedure well. Findings:      The examined esophagus was normal.      The stomach was normal.      A single small angioectasia without bleeding was found in the second       portion of the duodenum. Coagulation for tissue destruction using argon       plasma at 0.4 liters/minute and 20 watts was successful.      A single small sessile polyp with no bleeding was found in the duodenal       bulb. Biopsies were taken with a cold forceps for histology. Impression:           - Normal esophagus.                       - Normal  stomach.                       - A  single non-bleeding angioectasia in the duodenum.                        Treated with argon plasma coagulation (APC).                       - A single duodenal polyp. Biopsied. Recommendation:       - Await pathology results. Manya Silvas, MD 12/14/2016 9:45:30 AM This report has been signed electronically. Number of Addenda: 0 Note Initiated On: 12/14/2016 9:22 AM      [facility]

## 2016-12-15 ENCOUNTER — Encounter: Payer: Self-pay | Admitting: Unknown Physician Specialty

## 2016-12-15 LAB — SURGICAL PATHOLOGY

## 2016-12-16 NOTE — Anesthesia Postprocedure Evaluation (Signed)
Anesthesia Post Note  Patient: ROSELLA CRANDELL  Procedure(s) Performed: Procedure(s) (LRB): ESOPHAGOGASTRODUODENOSCOPY (EGD) (N/A) COLONOSCOPY WITH PROPOFOL (N/A)  Patient location during evaluation: Endoscopy Anesthesia Type: General Level of consciousness: awake and alert Pain management: pain level controlled Vital Signs Assessment: post-procedure vital signs reviewed and stable Respiratory status: spontaneous breathing, nonlabored ventilation, respiratory function stable and patient connected to nasal cannula oxygen Cardiovascular status: blood pressure returned to baseline and stable Postop Assessment: no signs of nausea or vomiting Anesthetic complications: no     Last Vitals:  Vitals:   12/14/16 1052 12/14/16 1102  BP: (!) 141/61 (!) 147/61  Pulse: 84 71  Resp: 20 (!) 21  Temp:      Last Pain:  Vitals:   12/15/16 0737  TempSrc:   PainSc: 0-No pain                 Martha Clan

## 2017-02-27 ENCOUNTER — Encounter: Payer: Medicare Other | Admitting: Internal Medicine

## 2017-03-27 ENCOUNTER — Other Ambulatory Visit: Payer: Self-pay | Admitting: Internal Medicine

## 2017-03-30 ENCOUNTER — Encounter: Payer: Self-pay | Admitting: Internal Medicine

## 2017-03-30 ENCOUNTER — Ambulatory Visit (INDEPENDENT_AMBULATORY_CARE_PROVIDER_SITE_OTHER): Payer: Medicare Other | Admitting: Internal Medicine

## 2017-03-30 VITALS — BP 124/60 | HR 101 | Temp 98.0°F | Ht 63.25 in | Wt 112.0 lb

## 2017-03-30 DIAGNOSIS — D500 Iron deficiency anemia secondary to blood loss (chronic): Secondary | ICD-10-CM

## 2017-03-30 DIAGNOSIS — I10 Essential (primary) hypertension: Secondary | ICD-10-CM | POA: Diagnosis not present

## 2017-03-30 DIAGNOSIS — Z0000 Encounter for general adult medical examination without abnormal findings: Secondary | ICD-10-CM | POA: Diagnosis not present

## 2017-03-30 DIAGNOSIS — E441 Mild protein-calorie malnutrition: Secondary | ICD-10-CM | POA: Diagnosis not present

## 2017-03-30 DIAGNOSIS — Z7189 Other specified counseling: Secondary | ICD-10-CM

## 2017-03-30 DIAGNOSIS — E119 Type 2 diabetes mellitus without complications: Secondary | ICD-10-CM

## 2017-03-30 LAB — HM DIABETES FOOT EXAM

## 2017-03-30 NOTE — Assessment & Plan Note (Signed)
I have personally reviewed the Medicare Annual Wellness questionnaire and have noted 1. The patient's medical and social history 2. Their use of alcohol, tobacco or illicit drugs 3. Their current medications and supplements 4. The patient's functional ability including ADL's, fall risks, home safety risks and hearing or visual             impairment. 5. Diet and physical activities 6. Evidence for depression or mood disorders  The patients weight, height, BMI and visual acuity have been recorded in the chart I have made referrals, counseling and provided education to the patient based review of the above and I have provided the pt with a written personalized care plan for preventive services.  I have provided you with a copy of your personalized plan for preventive services. Please take the time to review along with your updated medication list.  She will set up mammogram Yearly flu shot Will consider shingrix Recent colonoscopy

## 2017-03-30 NOTE — Assessment & Plan Note (Signed)
Still seems to have good control Will check labs

## 2017-03-30 NOTE — Progress Notes (Signed)
Subjective:    Patient ID: Kathryn Ware, female    DOB: May 01, 1941, 76 y.o.   MRN: [id number]  HPI Here for Medicare wellness and follow up of chronic medical conditions Reviewed form and advanced directives Reviewed other doctors--- Dr Vira Agar (GI), Bulakowski (opto),  Still smokes---counseled. Still ~1PPD ---asked her to at least cut back No alcohol Vision and hearing are okay Only hospitalization was for GI bleeding--in [facility] No falls Occasionally feels down or depressed. Not anhedonic Mild memory issues--nothing serious Independent with instrumental ADLs  No further GI bleeding Continues on iron, carafate and PPI  Checks sugars daily Usually 110-150 fasting ---avg ~125 No hypoglycemic reactions No sores, pain or numbness in feet  Having some right hip pain--worst when walking Better now Was rubbing in alcohol  No chest pain No SOB No dizziness or syncope No edema No headache Occasional brief palpitations  Has lost some weight  Appetite is not great --- she doesn't "eat like I should"  Current Outpatient Prescriptions on File Prior to Visit  Medication Sig Dispense Refill  . clonazePAM (KLONOPIN) 0.5 MG tablet Take 1 tablet (0.5 mg total) by mouth daily as needed. 30 tablet 0  . ferrous sulfate 325 (65 FE) MG tablet Take 325 mg by mouth 2 (two) times daily.     Marland Kitchen lisinopril-hydrochlorothiazide (PRINZIDE,ZESTORETIC) 20-12.5 MG tablet Take 1 tablet by mouth daily. 1 tablet 0  . metFORMIN (GLUCOPHAGE-XR) 500 MG 24 hr tablet Take 500 mg by mouth daily with breakfast.    . omeprazole (PRILOSEC) 40 MG capsule TAKE 1 CAPSULE(40 MG) BY MOUTH TWICE DAILY BEFORE A MEAL 180 capsule 3  . ONE TOUCH ULTRA TEST test strip USE AS DIRECTED ONCE DAILY 100 each 3  . simvastatin (ZOCOR) 80 MG tablet TAKE 1 TABLET(80 MG) BY MOUTH DAILY 90 tablet 3  . sucralfate (CARAFATE) 1 g tablet TAKE 1 TABLET BY MOUTH EVERY NIGHT BEFORE MEALS AND AT BEDTIME 120 tablet 0  . vitamin B-12  (CYANOCOBALAMIN) 1000 MCG tablet Take 1,000 mcg by mouth daily.     No current facility-administered medications on file prior to visit.     Allergies  Allergen Reactions  . Metformin     REACTION: diarrhea when dosage is increased, patient aware that she is onmetformin when it is listed that she is allergic  . Nifedipine     REACTION: cough    Past Medical History:  Diagnosis Date  . Anemia   . Blood transfusion without reported diagnosis   . Diabetes mellitus type II   . GI AVM (gastrointestinal arteriovenous vascular malformation)    stomach  . History of echocardiogram 3/15   a. 12/2013: EF 60-65%, DD, mild LVH, nl RV size & systolic function, mild MR/TR, mild AS, nl RVSP  . History of nuclear stress test    a. 12/2013: low risk, no sig WMA, nondiag EKG 2/2 baseline LVH w/ repol abnl, no sig ischemia, EF 70% no artifact  . HLD (hyperlipidemia)   . HTN (hypertension)   . RLS (restless legs syndrome)     Past Surgical History:  Procedure Laterality Date  . ABDOMINAL ADHESION SURGERY  11/2001   Dr. Tamala Julian  . ABDOMINAL HYSTERECTOMY  1976   RSO (fibroid)  . COLONOSCOPY WITH PROPOFOL N/A 07/07/2016   Procedure: COLONOSCOPY WITH PROPOFOL;  Surgeon: Manya Silvas, MD;  Location: [facility] ENDOSCOPY;  Service: Endoscopy;  Laterality: N/A;  . COLONOSCOPY WITH PROPOFOL N/A 12/14/2016   Procedure: COLONOSCOPY WITH PROPOFOL;  Surgeon: Herbie Baltimore  Federico Flake, MD;  Location: [facility] ENDOSCOPY;  Service: Endoscopy;  Laterality: N/A;  . DOBUTAMINE STRESS ECHO  11/10   normal  . DOPPLER ECHOCARDIOGRAPHY     EF 55%, mild MR, TR  . ESOPHAGOGASTRODUODENOSCOPY  07/2006   multiple angioectasias  . ESOPHAGOGASTRODUODENOSCOPY Left 03/12/2015   Procedure: place tube in throat and evaluate stomach and duodenum for source of bleeding. Cauterize if concern for rebleeding.;  Surgeon: Hulen Luster, MD;  Location: [facility] ENDOSCOPY;  Service: Endoscopy;  Laterality: Left;  . ESOPHAGOGASTRODUODENOSCOPY N/A 12/14/2016    Procedure: ESOPHAGOGASTRODUODENOSCOPY (EGD);  Surgeon: Manya Silvas, MD;  Location: [facility] ENDOSCOPY;  Service: Endoscopy;  Laterality: N/A;  . ESOPHAGOGASTRODUODENOSCOPY (EGD) WITH PROPOFOL N/A 11/23/2015   Procedure: ESOPHAGOGASTRODUODENOSCOPY (EGD) WITH PROPOFOL;  Surgeon: Hulen Luster, MD;  Location: [facility] ENDOSCOPY;  Service: Gastroenterology;  Laterality: N/A;  . ESOPHAGOGASTRODUODENOSCOPY (EGD) WITH PROPOFOL N/A 07/07/2016   Procedure: ESOPHAGOGASTRODUODENOSCOPY (EGD) WITH PROPOFOL;  Surgeon: Manya Silvas, MD;  Location: [facility] ENDOSCOPY;  Service: Endoscopy;  Laterality: N/A;  . laryngeal polyp  10/2008   Dr. Tami Ribas  . TOP      Family History  Problem Relation Age of Onset  . Stroke Father   . Cancer Sister        breast cancer  . Hypertension Mother   . Hypertension Unknown        sibling  . Diabetes Unknown        grandmother    Social History   Social History  . Marital status: Widowed    Spouse name: N/A  . Number of children: 1  . Years of education: N/A   Occupational History  . Seamstress         Social History Main Topics  . Smoking status: Current Every Day Smoker    Packs/day: 1.00    Types: Cigarettes  . Smokeless tobacco: Never Used  . Alcohol use No  . Drug use: No  . Sexual activity: Not on file   Other Topics Concern  . Not on file   Social History Narrative   No living will   Requests son Orson Gear as health care POA   Would accept resuscitation --but no prolonged machines   Not sure about feeding tubes   Review of Systems Full dentures--no dentist Wears seat belt Sleeps okay Bowels are regular Voids fine--no blood or pain. No incontinence No rash or lesions---just dry skin No heartburn or dysphagia    Objective:   Physical Exam  Constitutional: She is oriented to person, place, and time. No distress.  HENT:  Mouth/Throat: Oropharynx is clear and moist. No oropharyngeal exudate.  Neck: No thyromegaly present.    Cardiovascular: Normal rate, regular rhythm and intact distal pulses.  Exam reveals no gallop.   Gr 3/6 aortic systolic murmur--but also heard at apex  Pulmonary/Chest: Effort normal and breath sounds normal. No respiratory distress. She has no wheezes. She has no rales.  Abdominal: Soft. There is no tenderness.  Musculoskeletal: She exhibits no edema or tenderness.  Normal ROM of right hip  Lymphadenopathy:    She has no cervical adenopathy.  Neurological: She is alert and oriented to person, place, and time.  President--- "Milinda Pointer, Obama, Bush" 100- "bad on math" D-l-r-o-w Recall 2/3  Normal sensation in feet  Skin: No rash noted. No erythema.  No foot lesions  Psychiatric: She has a normal mood and affect. Her behavior is normal.          Assessment & Plan:

## 2017-03-30 NOTE — Assessment & Plan Note (Signed)
Will recheck labs On iron Still with cardiac flow murmur

## 2017-03-30 NOTE — Assessment & Plan Note (Signed)
Has lost weight Discussed supplements till weight back up to her normal

## 2017-03-30 NOTE — Assessment & Plan Note (Signed)
BP Readings from Last 3 Encounters:  03/30/17 124/60  12/14/16 (!) 147/61  07/20/16 (!) 96/50   Good control now

## 2017-03-30 NOTE — Patient Instructions (Signed)
Please set up your screening mammogram. 

## 2017-03-30 NOTE — Assessment & Plan Note (Signed)
See social history 

## 2017-03-31 LAB — COMPREHENSIVE METABOLIC PANEL
ALK PHOS: 43 U/L (ref 39–117)
ALT: 8 U/L (ref 0–35)
AST: 16 U/L (ref 0–37)
Albumin: 4.4 g/dL (ref 3.5–5.2)
BUN: 22 mg/dL (ref 6–23)
CO2: 27 meq/L (ref 19–32)
Calcium: 10 mg/dL (ref 8.4–10.5)
Chloride: 108 mEq/L (ref 96–112)
Creatinine, Ser: 1.05 mg/dL (ref 0.40–1.20)
GFR: 65.68 mL/min (ref >60.00)
GLUCOSE: 97 mg/dL (ref 70–99)
POTASSIUM: 4.3 meq/L (ref 3.5–5.1)
Sodium: 141 mEq/L (ref 135–145)
Total Bilirubin: 0.2 mg/dL (ref 0.2–1.2)
Total Protein: 7.3 g/dL (ref 6.0–8.3)

## 2017-03-31 LAB — LIPID PANEL
CHOL/HDL RATIO: 4
Cholesterol: 138 mg/dL (ref 0–200)
HDL: 38.6 mg/dL — ABNORMAL LOW (ref >39.00)
LDL CALC: 74 mg/dL (ref 0–99)
NONHDL: 99.62
Triglycerides: 130 mg/dL (ref 0.0–149.0)
VLDL: 26 mg/dL (ref 0.0–40.0)

## 2017-03-31 LAB — CBC WITH DIFFERENTIAL/PLATELET
Basophils Absolute: 0.1 10*3/uL (ref 0.0–0.1)
Basophils Relative: 1.1 % (ref 0.0–3.0)
EOS PCT: 1.2 % (ref 0.0–5.0)
Eosinophils Absolute: 0.1 10*3/uL (ref 0.0–0.7)
HEMATOCRIT: 28.5 % — AB (ref 36.0–46.0)
Hemoglobin: 9.4 g/dL — ABNORMAL LOW (ref 12.0–15.0)
LYMPHS ABS: 1.4 10*3/uL (ref 0.7–4.0)
LYMPHS PCT: 19.5 % (ref 12.0–46.0)
MCHC: 33.1 g/dL (ref 30.0–36.0)
MCV: 86.3 fl (ref 78.0–100.0)
MONOS PCT: 7.6 % (ref 3.0–12.0)
Monocytes Absolute: 0.6 10*3/uL (ref 0.1–1.0)
NEUTROS ABS: 5.2 10*3/uL (ref 1.4–7.7)
NEUTROS PCT: 70.6 % (ref 43.0–77.0)
Platelets: 296 10*3/uL (ref 150.0–400.0)
RBC: 3.3 Mil/uL — AB (ref 3.87–5.11)
RDW: 13.8 % (ref 11.5–15.5)
WBC: 7.3 10*3/uL (ref 4.0–10.5)

## 2017-03-31 LAB — HEMOGLOBIN A1C: Hgb A1c MFr Bld: 6.9 % — ABNORMAL HIGH (ref 4.6–6.5)

## 2017-03-31 LAB — T4, FREE: Free T4: 0.71 ng/dL (ref 0.60–1.60)

## 2017-04-19 ENCOUNTER — Telehealth: Payer: Self-pay

## 2017-04-19 ENCOUNTER — Encounter: Payer: Self-pay | Admitting: Internal Medicine

## 2017-04-19 DIAGNOSIS — Z1231 Encounter for screening mammogram for malignant neoplasm of breast: Secondary | ICD-10-CM | POA: Diagnosis not present

## 2017-04-19 NOTE — Telephone Encounter (Signed)
Pt said she was seen last week for her rt hip and leg pain. She said it is hard to walk and hurts all the time. She is wondering if there is a specialist she could see.

## 2017-04-20 NOTE — Telephone Encounter (Signed)
I spoke to patient and she scheduled appointment with Dr.Copland on 04/24/17.

## 2017-04-20 NOTE — Telephone Encounter (Signed)
I don't think she has severe disease in the hip and leg to require surgery. Please set her up with Dr Lorelei Pont for further evaluation

## 2017-04-24 ENCOUNTER — Ambulatory Visit (INDEPENDENT_AMBULATORY_CARE_PROVIDER_SITE_OTHER): Payer: Medicare Other | Admitting: Family Medicine

## 2017-04-24 ENCOUNTER — Ambulatory Visit (INDEPENDENT_AMBULATORY_CARE_PROVIDER_SITE_OTHER)
Admission: RE | Admit: 2017-04-24 | Discharge: 2017-04-24 | Disposition: A | Discharge status: Home / Self Care | Payer: Medicare Other | Source: Ambulatory Visit | Attending: Family Medicine | Admitting: Family Medicine

## 2017-04-24 ENCOUNTER — Encounter: Payer: Self-pay | Admitting: Family Medicine

## 2017-04-24 VITALS — BP 134/60 | HR 84 | Temp 97.6°F | Ht 63.25 in | Wt 113.0 lb

## 2017-04-24 DIAGNOSIS — M25551 Pain in right hip: Secondary | ICD-10-CM

## 2017-04-24 DIAGNOSIS — R29898 Other symptoms and signs involving the musculoskeletal system: Secondary | ICD-10-CM | POA: Diagnosis not present

## 2017-04-24 DIAGNOSIS — M1611 Unilateral primary osteoarthritis, right hip: Secondary | ICD-10-CM | POA: Diagnosis not present

## 2017-04-24 MED ORDER — PREDNISONE 20 MG PO TABS: 20.0000 mg | ORAL_TABLET | Freq: Every day | ORAL | 0 refills | Status: DC

## 2017-04-24 NOTE — Progress Notes (Signed)
Dr. Frederico Hamman T. Kasyn Stouffer, MD, [facility] [place], [address] Phone: [phone] Fax: [phone]  04/24/2017  Patient: Kathryn Ware, MRN: [id number], DOB: [birth year], 76 y.o.  Primary Physician:  Venia Carbon, MD   Chief Complaint  Patient presents with  . Leg Pain    Right  . Ear Problem    Right-Hears heart beat in ear and feels like a drum in her head   Subjective:   Kathryn Ware is a 76 y.o. very pleasant female patient who presents with the following:  Right leg pain / hip: She describes having pain in the posterior buttocks on the right as well as lateral pain that is been ongoing for about 6 months. She does not have any discrete injury or accident she can recall. 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 She is now age 18 years old. She does not describe any groin pain. She has no numbness or radiculopathy.  Lab Results  Component Value Date   HGBA1C 6.9 (H) 03/30/2017     Past Medical History, Surgical History, Social History, Family History, Problem List, Medications, and Allergies have been reviewed and updated if relevant.  Patient Active Problem List   Diagnosis Date Noted  . Malnutrition of mild degree ([place]) 03/30/2017  . Anemia 07/05/2016  . Restless legs syndrome (RLS) 02/23/2016  . 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 Anemia due to gastrointestinal blood loss 02/18/2015  . Advance directive discussed with patient 02/18/2015  . Nicotine dependence 10/29/2013  . Routine general medical examination at a health care facility 08/17/2011  . Diabetes mellitus type 2, controlled, without complications ([place]) 00/93/8182  . HYPERCHOLESTEROLEMIA 02/02/2007  . Essential hypertension, benign 02/02/2007  . Angiodysplasia of intestinal tract 02/02/2007    Past Medical History:    Diagnosis Date  . Anemia   . Blood transfusion without reported diagnosis   . Diabetes mellitus type II   . GI AVM (gastrointestinal arteriovenous vascular malformation)    stomach  . History of echocardiogram 3/15   a. 12/2013: EF 60-65%, DD, mild LVH, nl RV size & systolic function, mild MR/TR, mild AS, nl RVSP  . History of nuclear stress test    a. 12/2013: low risk, no sig WMA, nondiag EKG 2/2 baseline LVH w/ repol abnl, no sig ischemia, EF 70% no artifact  . HLD (hyperlipidemia)   . HTN (hypertension)   . RLS (restless legs syndrome)     Past Surgical History:  Procedure Laterality Date  . ABDOMINAL ADHESION SURGERY  11/2001   Dr. Tamala Julian  . ABDOMINAL HYSTERECTOMY  1976   RSO (fibroid)  . COLONOSCOPY WITH PROPOFOL N/A 07/07/2016   Procedure: COLONOSCOPY WITH PROPOFOL;  Surgeon: Manya Silvas, MD;  Location: [facility] ENDOSCOPY;  Service: Endoscopy;  Laterality: N/A;  . COLONOSCOPY WITH PROPOFOL N/A 12/14/2016   Procedure: COLONOSCOPY WITH PROPOFOL;  Surgeon: Manya Silvas, MD;  Location: [facility] ENDOSCOPY;  Service: Endoscopy;  Laterality: N/A;  . DOBUTAMINE STRESS ECHO  11/10   normal  . DOPPLER ECHOCARDIOGRAPHY     EF 55%, mild MR, TR  . ESOPHAGOGASTRODUODENOSCOPY  07/2006   multiple angioectasias  . ESOPHAGOGASTRODUODENOSCOPY Left 03/12/2015   Procedure: place tube in throat and evaluate stomach and duodenum for source of bleeding. Cauterize if concern for rebleeding.;  Surgeon: Eddie Dibbles  Johnell Comings, MD;  Location: [facility] ENDOSCOPY;  Service: Endoscopy;  Laterality: Left;  . ESOPHAGOGASTRODUODENOSCOPY N/A 12/14/2016   Procedure: ESOPHAGOGASTRODUODENOSCOPY (EGD);  Surgeon: Manya Silvas, MD;  Location: [facility] ENDOSCOPY;  Service: Endoscopy;  Laterality: N/A;  . ESOPHAGOGASTRODUODENOSCOPY (EGD) WITH PROPOFOL N/A 11/23/2015   Procedure: ESOPHAGOGASTRODUODENOSCOPY (EGD) WITH PROPOFOL;  Surgeon: Hulen Luster, MD;  Location[facility] ENDOSCOPY;  Service: Gastroenterology;  Laterality: N/A;  .  ESOPHAGOGASTRODUODENOSCOPY (EGD) WITH PROPOFOL N/A 07/07/2016   Procedure: ESOPHAGOGASTRODUODENOSCOPY (EGD) WITH PROPOFOL;  Surgeon: Manya Silvas, MD;  Location[facility] ENDOSCOPY;  Service: Endoscopy;  Laterality: N/A;  . laryngeal polyp  10/2008   Dr. Tami Ribas  . TOP      Social History   Social History  . Marital status: Widowed    Spouse name: N/A  . Number of children: 1  . Years of education: N/A   Occupational History  . Seamstress         Social History Main Topics  . Smoking status: Current Every Day Smoker    Packs/day: 1.00    Types: Cigarettes  . Smokeless tobacco: Never Used  . Alcohol use No  . Drug use: No  . Sexual activity: Not on file   Other Topics Concern  . Not on file   Social History Narrative   No living will   Requests son Orson Gear as health care POA   Would accept resuscitation --but no prolonged machines   Not sure about feeding tubes    Family History  Problem Relation Age of Onset  . Stroke Father   . Cancer Sister        breast cancer  . Hypertension Mother   . Hypertension Unknown        sibling  . Diabetes Unknown        grandmother    Allergies  Allergen Reactions  . Metformin     REACTION: diarrhea when dosage is increased, patient aware that she is onmetformin when it is listed that she is allergic  . Nifedipine     REACTION: cough    Medication list reviewed and updated in full in [facility].  GEN: No fevers, chills. Nontoxic. Primarily MSK c/o today. MSK: Detailed in the HPI GI: tolerating PO intake without difficulty Neuro: No numbness, parasthesias, or tingling associated. Otherwise the pertinent positives of the ROS are noted above.   Objective:   BP 134/60   Pulse 84   Temp 97.6 F (36.4 C) (Oral)   Ht 5' 3.25" (1.607 m)   Wt 113 lb (51.3 kg)   BMI 19.86 kg/m    GEN: Well-developed,well-nourished,in no acute distress; alert,appropriate and cooperative throughout examination HEENT:  Normocephalic and atraumatic without obvious abnormalities. Ears, externally no deformities PULM: Breathing comfortably in no respiratory distress EXT: No clubbing, cyanosis, or edema PSYCH: Normally interactive. Cooperative during the interview. Pleasant. Friendly and conversant. Not anxious or depressed appearing. Normal, full affect.  Range of motion at  the waist: Flexion: normal Extension: normal Lateral bending: normal Rotation: all normal  No echymosis or edema Rises to examination table with no difficulty Gait: non antalgic  Inspection/Deformity: N Paraspinus Tenderness: NT  B Ankle Dorsiflexion (L5,4): 5/5 B Great Toe Dorsiflexion (L5,4): 5/5 Heel Walk (L5): WNL Toe Walk (S1): WNL Rise/Squat (L4): WNL  SENSORY B Medial Foot (L4): WNL B Dorsum (L5): WNL B Lateral (S1): WNL Light Touch: WNL Pinprick: WNL  B SLR, seated: neg B SLR, supine: neg B FABER: neg B Reverse FABER:  neg B Greater Troch: NT B Log Roll: neg B Sciatic Notch: NT   HIP EXAM: SIDE: R ROM: Abduction, Flexion, Internal and External range of motion: full Pain with terminal IROM and EROM: mild pain terminally GTB: mild pain SLR: NEG Knees: No effusion FABER: NT REVERSE FABER: NT, neg Piriformis: TTP at direct palpation Str: flexion: 3/5 abduction: 3+/5 adduction: 3/5 Strength testing tender  Radiology: Dg Hip Unilat With Pelvis 2-3 Views Right  Result Date: 04/24/2017 CLINICAL DATA:  76 year old female right hip pain for 6 months. No reported injury. Initial encounter. EXAM: DG HIP (WITH OR WITHOUT PELVIS) 2-3V RIGHT COMPARISON:  None. FINDINGS: Hip joint preserved bilaterally. No plain film evidence of femoral head avascular necrosis. No fracture or dislocation. Degenerative changes lower lumbar spine incompletely assessed. Vascular calcifications. IMPRESSION: Plain film examination right hip unremarkable. Degenerative changes lower lumbar spine incompletely assessed. Electronically Signed    By: Genia Del M.D.   On: 04/24/2017 12:55     Assessment and Plan:   Right hip pain - Plan: DG HIP UNILAT WITH PELVIS 2-3 VIEWS RIGHT  Right leg weakness  Overuse injury, right posterior hip stabilizers. Very significant secondary weakness in all muscles that stabilize the hip.  20 milligram prednisone acutely, and again home hip rehabilitation program. If she has any difficult whatsoever, formal physical therapy would be entirely reasonable.  I'm going to have her follow-up with me in 6 weeks.  Future Appointments Date Time Provider [place]  10/02/2017 11:00 AM Venia Carbon, MD [facility]    Meds ordered this encounter  Medications  . predniSONE (DELTASONE) 20 MG tablet    Sig: Take 1 tablet (20 mg total) by mouth daily with breakfast.    Dispense:  10 tablet    Refill:  0   There are no discontinued medications. Orders Placed This Encounter  Procedures  . DG HIP UNILAT WITH PELVIS 2-3 VIEWS RIGHT    Signed,  Dominik Yordy T. Everlena Mackley, MD   Allergies as of 04/24/2017      Reactions   Metformin    REACTION: diarrhea when dosage is increased, patient aware that she is onmetformin when it is listed that she is allergic   Nifedipine    REACTION: cough      Medication List       Accurate as of 04/24/17  2:06 PM. Always use your most recent med list.          clonazePAM 0.5 MG tablet Commonly known as:  KLONOPIN Take 1 tablet (0.5 mg total) by mouth daily as needed.   ferrous sulfate 325 (65 FE) MG tablet Take 325 mg by mouth 2 (two) times daily.   lisinopril-hydrochlorothiazide 20-12.5 MG tablet Commonly known as:  PRINZIDE,ZESTORETIC Take 1 tablet by mouth daily.   metFORMIN 500 MG 24 hr tablet Commonly known as:  GLUCOPHAGE-XR Take 500 mg by mouth daily with breakfast.   omeprazole 40 MG capsule Commonly known as:  PRILOSEC TAKE 1 CAPSULE(40 MG) BY MOUTH TWICE DAILY BEFORE A MEAL   ONE TOUCH ULTRA TEST test strip Generic drug:   glucose blood USE AS DIRECTED ONCE DAILY   predniSONE 20 MG tablet Commonly known as:  DELTASONE Take 1 tablet (20 mg total) by mouth daily with breakfast.   simvastatin 80 MG tablet Commonly known as:  ZOCOR TAKE 1 TABLET(80 MG) BY MOUTH DAILY   sucralfate 1 g tablet Commonly known as:  CARAFATE TAKE 1 TABLET BY MOUTH EVERY NIGHT BEFORE MEALS AND AT  BEDTIME   vitamin B-12 1000 MCG tablet Commonly known as:  CYANOCOBALAMIN Take 1,000 mcg by mouth daily.

## 2017-05-01 ENCOUNTER — Ambulatory Visit: Payer: Medicare Other | Admitting: Family Medicine

## 2017-05-02 ENCOUNTER — Telehealth: Payer: Self-pay | Admitting: Internal Medicine

## 2017-05-02 DIAGNOSIS — R928 Other abnormal and inconclusive findings on diagnostic imaging of breast: Secondary | ICD-10-CM

## 2017-05-02 NOTE — Telephone Encounter (Signed)
Order created for Left Diagnostic Mammogram. Called [facility] to let them know the order was in.

## 2017-05-02 NOTE — Telephone Encounter (Signed)
Please set this up for me

## 2017-05-02 NOTE — Telephone Encounter (Signed)
Order placed. Called [facility] and let them know. THey said they cannot see it until Dr Silvio Pate signs it. I will follow-up tomorrow.

## 2017-05-02 NOTE — Telephone Encounter (Signed)
[facility] called. Pt needs additional images taken, they do not do them at their location.  Pt needs order sent to [facility] for diagnostic unilateral left side mammogram  Wants to go to [place].  cb number is [phone]

## 2017-05-03 NOTE — Telephone Encounter (Signed)
Left message to see if they were setting her up.

## 2017-05-09 NOTE — Telephone Encounter (Signed)
Spoke to [place] at [place]. She said they will request her previous images and and send me a message to order the appropriate test.

## 2017-05-10 ENCOUNTER — Emergency Department: Payer: Medicare Other

## 2017-05-10 ENCOUNTER — Encounter: Payer: Self-pay | Admitting: Emergency Medicine

## 2017-05-10 ENCOUNTER — Inpatient Hospital Stay
Admission: EM | Admit: 2017-05-10 | Discharge: 2017-05-12 | DRG: 812 | Disposition: A | Discharge status: Home / Self Care | Payer: Medicare Other | Attending: Internal Medicine | Admitting: Internal Medicine

## 2017-05-10 DIAGNOSIS — F17210 Nicotine dependence, cigarettes, uncomplicated: Secondary | ICD-10-CM | POA: Diagnosis present

## 2017-05-10 DIAGNOSIS — Z7984 Long term (current) use of oral hypoglycemic drugs: Secondary | ICD-10-CM

## 2017-05-10 DIAGNOSIS — R9431 Abnormal electrocardiogram [ECG] [EKG]: Secondary | ICD-10-CM | POA: Diagnosis not present

## 2017-05-10 DIAGNOSIS — Z79899 Other long term (current) drug therapy: Secondary | ICD-10-CM | POA: Diagnosis not present

## 2017-05-10 DIAGNOSIS — E119 Type 2 diabetes mellitus without complications: Secondary | ICD-10-CM | POA: Diagnosis present

## 2017-05-10 DIAGNOSIS — R079 Chest pain, unspecified: Secondary | ICD-10-CM

## 2017-05-10 DIAGNOSIS — K921 Melena: Secondary | ICD-10-CM | POA: Diagnosis present

## 2017-05-10 DIAGNOSIS — E785 Hyperlipidemia, unspecified: Secondary | ICD-10-CM | POA: Diagnosis present

## 2017-05-10 DIAGNOSIS — Z8249 Family history of ischemic heart disease and other diseases of the circulatory system: Secondary | ICD-10-CM | POA: Diagnosis not present

## 2017-05-10 DIAGNOSIS — D649 Anemia, unspecified: Secondary | ICD-10-CM | POA: Diagnosis present

## 2017-05-10 DIAGNOSIS — E118 Type 2 diabetes mellitus with unspecified complications: Secondary | ICD-10-CM | POA: Diagnosis present

## 2017-05-10 DIAGNOSIS — Z7952 Long term (current) use of systemic steroids: Secondary | ICD-10-CM | POA: Diagnosis not present

## 2017-05-10 DIAGNOSIS — R0789 Other chest pain: Secondary | ICD-10-CM | POA: Diagnosis present

## 2017-05-10 DIAGNOSIS — K31819 Angiodysplasia of stomach and duodenum without bleeding: Secondary | ICD-10-CM

## 2017-05-10 DIAGNOSIS — E7800 Pure hypercholesterolemia, unspecified: Secondary | ICD-10-CM | POA: Diagnosis present

## 2017-05-10 DIAGNOSIS — Z888 Allergy status to other drugs, medicaments and biological substances status: Secondary | ICD-10-CM

## 2017-05-10 DIAGNOSIS — Q2733 Arteriovenous malformation of digestive system vessel: Secondary | ICD-10-CM | POA: Diagnosis not present

## 2017-05-10 DIAGNOSIS — I10 Essential (primary) hypertension: Secondary | ICD-10-CM | POA: Diagnosis not present

## 2017-05-10 DIAGNOSIS — D500 Iron deficiency anemia secondary to blood loss (chronic): Principal | ICD-10-CM | POA: Diagnosis present

## 2017-05-10 DIAGNOSIS — R42 Dizziness and giddiness: Secondary | ICD-10-CM | POA: Diagnosis present

## 2017-05-10 DIAGNOSIS — K922 Gastrointestinal hemorrhage, unspecified: Secondary | ICD-10-CM | POA: Diagnosis present

## 2017-05-10 DIAGNOSIS — I340 Nonrheumatic mitral (valve) insufficiency: Secondary | ICD-10-CM | POA: Diagnosis not present

## 2017-05-10 DIAGNOSIS — K5520 Angiodysplasia of colon without hemorrhage: Secondary | ICD-10-CM | POA: Diagnosis present

## 2017-05-10 DIAGNOSIS — Z9289 Personal history of other medical treatment: Secondary | ICD-10-CM

## 2017-05-10 DIAGNOSIS — F17200 Nicotine dependence, unspecified, uncomplicated: Secondary | ICD-10-CM | POA: Diagnosis present

## 2017-05-10 DIAGNOSIS — D62 Acute posthemorrhagic anemia: Secondary | ICD-10-CM | POA: Diagnosis present

## 2017-05-10 DIAGNOSIS — K558 Other vascular disorders of intestine: Secondary | ICD-10-CM | POA: Diagnosis present

## 2017-05-10 LAB — LIPID PANEL
CHOL/HDL RATIO: 3.5 ratio
Cholesterol: 138 mg/dL (ref 0–200)
HDL: 40 mg/dL — ABNORMAL LOW (ref >40)
LDL CALC: 72 mg/dL (ref 0–99)
Triglycerides: 132 mg/dL (ref <150)
VLDL: 26 mg/dL (ref 0–40)

## 2017-05-10 LAB — BASIC METABOLIC PANEL
ANION GAP: 8 (ref 5–15)
BUN: 41 mg/dL — ABNORMAL HIGH (ref 6–20)
CALCIUM: 9.1 mg/dL (ref 8.9–10.3)
CO2: 23 mmol/L (ref 22–32)
Chloride: 109 mmol/L (ref 101–111)
Creatinine, Ser: 1.12 mg/dL — ABNORMAL HIGH (ref 0.44–1.00)
GFR, EST AFRICAN AMERICAN: 54 mL/min — AB (ref >60)
GFR, EST NON AFRICAN AMERICAN: 47 mL/min — AB (ref >60)
Glucose, Bld: 223 mg/dL — ABNORMAL HIGH (ref 65–99)
POTASSIUM: 4.5 mmol/L (ref 3.5–5.1)
SODIUM: 140 mmol/L (ref 135–145)

## 2017-05-10 LAB — TROPONIN I: Troponin I: <0.03 ng/mL (ref <0.03)

## 2017-05-10 LAB — CBC
HEMATOCRIT: 22 % — AB (ref 35.0–47.0)
HEMOGLOBIN: 7.1 g/dL — AB (ref 12.0–16.0)
MCH: 29.2 pg (ref 26.0–34.0)
MCHC: 32.1 g/dL (ref 32.0–36.0)
MCV: 91 fL (ref 80.0–100.0)
Platelets: 255 10*3/uL (ref 150–440)
RBC: 2.42 MIL/uL — AB (ref 3.80–5.20)
RDW: 15.5 % — ABNORMAL HIGH (ref 11.5–14.5)
WBC: 6 10*3/uL (ref 3.6–11.0)

## 2017-05-10 LAB — GLUCOSE, CAPILLARY: Glucose-Capillary: 139 mg/dL — ABNORMAL HIGH (ref 65–99)

## 2017-05-10 LAB — PREPARE RBC (CROSSMATCH)

## 2017-05-10 MED ORDER — LISINOPRIL-HYDROCHLOROTHIAZIDE 20-12.5 MG PO TABS: 1.0000 | ORAL_TABLET | Freq: Every day | ORAL | Status: DC

## 2017-05-10 MED ORDER — VITAMIN B-12 1000 MCG PO TABS
1000.0000 ug | ORAL_TABLET | Freq: Every day | ORAL | Status: DC
  Administered 2017-05-11 – 2017-05-12 (×2): 1000 ug via ORAL
  Filled 2017-05-10 (×2): qty 1

## 2017-05-10 MED ORDER — CLONAZEPAM 0.5 MG PO TABS: 0.5000 mg | ORAL_TABLET | Freq: Every day | ORAL | Status: DC | PRN

## 2017-05-10 MED ORDER — ATORVASTATIN CALCIUM 20 MG PO TABS
40.0000 mg | ORAL_TABLET | Freq: Every day | ORAL | Status: DC
  Administered 2017-05-11: 40 mg via ORAL
  Filled 2017-05-10: qty 2

## 2017-05-10 MED ORDER — ASPIRIN 81 MG PO CHEW
162.0000 mg | CHEWABLE_TABLET | Freq: Once | ORAL | Status: AC
  Administered 2017-05-10: 162 mg via ORAL
  Filled 2017-05-10: qty 2

## 2017-05-10 MED ORDER — SODIUM CHLORIDE 0.9 % IV SOLN
INTRAVENOUS | Status: AC
  Administered 2017-05-11: 05:00:00 via INTRAVENOUS

## 2017-05-10 MED ORDER — SODIUM CHLORIDE 0.9 % IV SOLN
Freq: Once | INTRAVENOUS | Status: AC
  Administered 2017-05-10: 21:00:00 via INTRAVENOUS

## 2017-05-10 MED ORDER — LISINOPRIL 20 MG PO TABS
20.0000 mg | ORAL_TABLET | Freq: Every day | ORAL | Status: DC
  Administered 2017-05-11 – 2017-05-12 (×2): 20 mg via ORAL
  Filled 2017-05-10 (×2): qty 1

## 2017-05-10 MED ORDER — HYDROCHLOROTHIAZIDE 12.5 MG PO CAPS
12.5000 mg | ORAL_CAPSULE | Freq: Every day | ORAL | Status: DC
  Administered 2017-05-11 – 2017-05-12 (×2): 12.5 mg via ORAL
  Filled 2017-05-10 (×2): qty 1

## 2017-05-10 MED ORDER — FERROUS SULFATE 325 (65 FE) MG PO TABS
325.0000 mg | ORAL_TABLET | Freq: Two times a day (BID) | ORAL | Status: DC
  Administered 2017-05-11 – 2017-05-12 (×3): 325 mg via ORAL
  Filled 2017-05-10 (×3): qty 1

## 2017-05-10 MED ORDER — PANTOPRAZOLE SODIUM 40 MG IV SOLR
40.0000 mg | Freq: Two times a day (BID) | INTRAVENOUS | Status: DC
  Administered 2017-05-11 (×3): 40 mg via INTRAVENOUS
  Filled 2017-05-10 (×3): qty 40

## 2017-05-10 MED ORDER — SUCRALFATE 1 G PO TABS
1.0000 g | ORAL_TABLET | Freq: Three times a day (TID) | ORAL | Status: DC
  Administered 2017-05-10 – 2017-05-12 (×4): 1 g via ORAL
  Filled 2017-05-10 (×5): qty 1

## 2017-05-10 MED ORDER — DOCUSATE SODIUM 100 MG PO CAPS: 100.0000 mg | ORAL_CAPSULE | Freq: Two times a day (BID) | ORAL | Status: DC | PRN

## 2017-05-10 MED ORDER — INSULIN ASPART 100 UNIT/ML ~~LOC~~ SOLN: 0.0000 [IU] | Freq: Three times a day (TID) | SUBCUTANEOUS | Status: DC

## 2017-05-10 NOTE — ED Triage Notes (Signed)
FIRST NURSE NOTE-Cough X 2 months with chest pain. Came EMS. No pain today per EMS.. 12 LEAD with EMS ok per their report.

## 2017-05-10 NOTE — H&P (Signed)
[facility] NAME: Kathryn Ware    MR#:  [id number]  DATE OF BIRTH:  05-Jan-1941  DATE OF ADMISSION:  05/10/2017  PRIMARY CARE PHYSICIAN: Venia Carbon, MD   REQUESTING/REFERRING PHYSICIAN: Rifenbark  CHIEF COMPLAINT:   Chief Complaint  Patient presents with  . Dizziness    HISTORY OF PRESENT ILLNESS: Kathryn Ware  is a 76 y.o. female with a known history of Anemia, blood transfusion, endoscopy and colonoscopy was done in February 2018 with finding of some polyps and some vascular ectasia in duodenum. Required blood transfusion in the past, hyperlipidemia, hypertension, diabetes- noted her stool is dark for last few days and is feeling dizzy so came to emergency room with that. Also some complaint of chest pain. In ER noted to have drop in her hemoglobin and stool quite is not done by ER physician yet. Her EKG shows some T-wave inversion in her inferior chest leads so suggested to admit to hospitalist service by ER physician.  PAST MEDICAL HISTORY:   Past Medical History:  Diagnosis Date  . Anemia   . Blood transfusion without reported diagnosis   . Diabetes mellitus type II   . GI AVM (gastrointestinal arteriovenous vascular malformation)    stomach  . History of echocardiogram 3/15   a. 12/2013: EF 60-65%, DD, mild LVH, nl RV size & systolic function, mild MR/TR, mild AS, nl RVSP  . History of nuclear stress test    a. 12/2013: low risk, no sig WMA, nondiag EKG 2/2 baseline LVH w/ repol abnl, no sig ischemia, EF 70% no artifact  . HLD (hyperlipidemia)   . HTN (hypertension)   . RLS (restless legs syndrome)     PAST SURGICAL HISTORY: Past Surgical History:  Procedure Laterality Date  . ABDOMINAL ADHESION SURGERY  11/2001   Dr. Tamala Julian  . ABDOMINAL HYSTERECTOMY  1976   RSO (fibroid)  . COLONOSCOPY WITH PROPOFOL N/A 07/07/2016   Procedure: COLONOSCOPY WITH PROPOFOL;  Surgeon: Manya Silvas, MD;  Location: [facility] ENDOSCOPY;   Service: Endoscopy;  Laterality: N/A;  . COLONOSCOPY WITH PROPOFOL N/A 12/14/2016   Procedure: COLONOSCOPY WITH PROPOFOL;  Surgeon: Manya Silvas, MD;  Location: [facility] ENDOSCOPY;  Service: Endoscopy;  Laterality: N/A;  . DOBUTAMINE STRESS ECHO  11/10   normal  . DOPPLER ECHOCARDIOGRAPHY     EF 55%, mild MR, TR  . ESOPHAGOGASTRODUODENOSCOPY  07/2006   multiple angioectasias  . ESOPHAGOGASTRODUODENOSCOPY Left 03/12/2015   Procedure: place tube in throat and evaluate stomach and duodenum for source of bleeding. Cauterize if concern for rebleeding.;  Surgeon: Hulen Luster, MD;  Location: [facility] ENDOSCOPY;  Service: Endoscopy;  Laterality: Left;  . ESOPHAGOGASTRODUODENOSCOPY N/A 12/14/2016   Procedure: ESOPHAGOGASTRODUODENOSCOPY (EGD);  Surgeon: Manya Silvas, MD;  Location: [facility] ENDOSCOPY;  Service: Endoscopy;  Laterality: N/A;  . ESOPHAGOGASTRODUODENOSCOPY (EGD) WITH PROPOFOL N/A 11/23/2015   Procedure: ESOPHAGOGASTRODUODENOSCOPY (EGD) WITH PROPOFOL;  Surgeon: Hulen Luster, MD;  Location: [facility] ENDOSCOPY;  Service: Gastroenterology;  Laterality: N/A;  . ESOPHAGOGASTRODUODENOSCOPY (EGD) WITH PROPOFOL N/A 07/07/2016   Procedure: ESOPHAGOGASTRODUODENOSCOPY (EGD) WITH PROPOFOL;  Surgeon: Manya Silvas, MD;  Location: [facility] ENDOSCOPY;  Service: Endoscopy;  Laterality: N/A;  . laryngeal polyp  10/2008   Dr. Tami Ribas  . TOP      SOCIAL HISTORY:  Social History  Substance Use Topics  . Smoking status: Current Every Day Smoker    Packs/day: 1.00    Types: Cigarettes  . Smokeless tobacco: Never  Used  . Alcohol use No    FAMILY HISTORY:  Family History  Problem Relation Age of Onset  . Stroke Father   . Cancer Sister        breast cancer  . Hypertension Mother   . Hypertension Unknown        sibling  . Diabetes Unknown        grandmother    DRUG ALLERGIES:  Allergies  Allergen Reactions  . Metformin     REACTION: diarrhea when dosage is increased, patient aware that she is onmetformin when  it is listed that she is allergic  . Nifedipine     REACTION: cough    REVIEW OF SYSTEMS:   CONSTITUTIONAL: No fever,Positive for fatigue or weakness.  EYES: No blurred or double vision.  EARS, NOSE, AND THROAT: No tinnitus or ear pain.  RESPIRATORY: No cough, shortness of breath, wheezing or hemoptysis.  CARDIOVASCULAR: Positive for chest pain, no orthopnea, edema.  GASTROINTESTINAL: No nausea, vomiting, diarrhea or abdominal pain.  GENITOURINARY: No dysuria, hematuria.  ENDOCRINE: No polyuria, nocturia,  HEMATOLOGY: No anemia, easy bruising or bleeding SKIN: No rash or lesion. MUSCULOSKELETAL: No joint pain or arthritis.   NEUROLOGIC: No tingling, numbness, weakness.  PSYCHIATRY: No anxiety or depression.   MEDICATIONS AT HOME:  Prior to Admission medications   Medication Sig Start Date End Date Taking? Authorizing Provider  clonazePAM (KLONOPIN) 0.5 MG tablet Take 1 tablet (0.5 mg total) by mouth daily as needed. 08/03/16   Venia Carbon, MD  ferrous sulfate 325 (65 FE) MG tablet Take 325 mg by mouth 2 (two) times daily.     [provider]  lisinopril-hydrochlorothiazide (PRINZIDE,ZESTORETIC) 20-12.5 MG tablet Take 1 tablet by mouth daily. 07/20/16   Venia Carbon, MD  metFORMIN (GLUCOPHAGE-XR) 500 MG 24 hr tablet Take 500 mg by mouth daily with breakfast.    [provider]  omeprazole (PRILOSEC) 40 MG capsule TAKE 1 CAPSULE(40 MG) BY MOUTH TWICE DAILY BEFORE A MEAL 04/28/16   Venia Carbon, MD  ONE TOUCH ULTRA TEST test strip USE AS DIRECTED ONCE DAILY 05/31/16   Venia Carbon, MD  predniSONE (DELTASONE) 20 MG tablet Take 1 tablet (20 mg total) by mouth daily with breakfast. 04/24/17   Copland, Frederico Hamman, MD  simvastatin (ZOCOR) 80 MG tablet TAKE 1 TABLET(80 MG) BY MOUTH DAILY 04/27/16   Viviana Simpler I, MD  sucralfate (CARAFATE) 1 g tablet TAKE 1 TABLET BY MOUTH EVERY NIGHT BEFORE MEALS AND AT BEDTIME 03/27/17   Venia Carbon, MD  vitamin B-12  (CYANOCOBALAMIN) 1000 MCG tablet Take 1,000 mcg by mouth daily.    [provider]      PHYSICAL EXAMINATION:   VITAL SIGNS: Blood pressure (!) 134/56, pulse 71, temperature 97.7 F (36.5 C), temperature source Oral, resp. rate 18, height 5\' 4"  (1.626 m), weight 52.2 kg (115 lb), SpO2 100 %.  GENERAL:  76 y.o.-year-old patient lying in the bed with no acute distress.  EYES: Pupils equal, round, reactive to light and accommodation. No scleral icterus. Extraocular muscles intact. Conjunctiva pale. HEENT: Head atraumatic, normocephalic. Oropharynx and nasopharynx clear.  NECK:  Supple, no jugular venous distention. No thyroid enlargement, no tenderness.  LUNGS: Normal breath sounds bilaterally, no wheezing, rales,rhonchi or crepitation. No use of accessory muscles of respiration.  CARDIOVASCULAR: S1, S2 normal. No murmurs, rubs, or gallops.  ABDOMEN: Soft, nontender, nondistended. Bowel sounds present. No organomegaly or mass.  EXTREMITIES: No pedal edema, cyanosis, or clubbing.  NEUROLOGIC: Cranial nerves II through XII are intact. Muscle strength 5/5 in all extremities. Sensation intact. Gait not checked.  PSYCHIATRIC: The patient is alert and oriented x 3.  SKIN: No obvious rash, lesion, or ulcer.   LABORATORY PANEL:   CBC  Recent Labs Lab 05/10/17 1242  WBC 6.0  HGB 7.1*  HCT 22.0*  PLT 255  MCV 91.0  MCH 29.2  MCHC 32.1  RDW 15.5*   ------------------------------------------------------------------------------------------------------------------  Chemistries   Recent Labs Lab 05/10/17 1242  NA 140  K 4.5  CL 109  CO2 23  GLUCOSE 223*  BUN 41*  CREATININE 1.12*  CALCIUM 9.1   ------------------------------------------------------------------------------------------------------------------ estimated creatinine clearance is 35.8 mL/min (A) (by C-G formula based on SCr of 1.12 mg/dL  (H)). ------------------------------------------------------------------------------------------------------------------ No results for input(s): TSH, T4TOTAL, T3FREE, THYROIDAB in the last 72 hours.  Invalid input(s): FREET3   Coagulation profile No results for input(s): INR, PROTIME in the last 168 hours. ------------------------------------------------------------------------------------------------------------------- No results for input(s): DDIMER in the last 72 hours. -------------------------------------------------------------------------------------------------------------------  Cardiac Enzymes  Recent Labs Lab 05/10/17 1242  TROPONINI <0.03   ------------------------------------------------------------------------------------------------------------------ Invalid input(s): POCBNP  ---------------------------------------------------------------------------------------------------------------  Urinalysis    Component Value Date/Time   COLORURINE YELLOW (A) 07/05/2016 2008   APPEARANCEUR CLEAR (A) 07/05/2016 2008   LABSPEC 1.015 07/05/2016 2008   PHURINE 7.0 07/05/2016 2008   GLUCOSEU NEGATIVE 07/05/2016 2008   HGBUR NEGATIVE 07/05/2016 2008   BILIRUBINUR NEGATIVE 07/05/2016 2008   KETONESUR NEGATIVE 07/05/2016 2008   PROTEINUR NEGATIVE 07/05/2016 2008   NITRITE NEGATIVE 07/05/2016 2008   LEUKOCYTESUR NEGATIVE 07/05/2016 2008     RADIOLOGY: Dg Chest 2 View  Result Date: 05/10/2017 CLINICAL DATA:  Chest pain, dizziness, tired for 4 months, increased symptoms today, diabetes, smoker EXAM: CHEST  2 VIEW COMPARISON:  07/05/2016 FINDINGS: Normal heart size, mediastinal contours, and pulmonary vascularity. Atherosclerotic calcification aorta. Emphysematous and minimal bronchitic changes consistent with COPD. Minimal biapical scarring. No acute infiltrate, pleural effusion or pneumothorax. Bones diffusely demineralized with dextroconvex scoliosis of the thoracolumbar  spine. IMPRESSION: COPD changes with mild biapical scarring. No acute abnormalities. Aortic Atherosclerosis (ICD10-I70.0) and Emphysema (ICD10-J43.9). Electronically Signed   By: Lavonia Dana M.D.   On: 05/10/2017 13:48    EKG: Orders placed or performed during the hospital encounter of 05/10/17  . ED EKG within 10 minutes  . ED EKG within 10 minutes  . EKG 12-Lead  . EKG 12-Lead    IMPRESSION AND PLAN:  * Symptomatically anemia   GI bleed   She has GI AV malformations as per previous colonoscopy and endoscopic.   No anticoagulant or antiplatelets at this time, PPI and sucralfate.   Clear liquid diet and IV fluids.   Transfuse 2 units of blood, GI consult.   discussed with pt about possible side effects of blood transfusion- including more common like low grade fever to rare and serious like renal and lung involvement and infections.  He understood- and due to necessity of transfusion- agreed to receive the transfusion.  * Chest pain    T-wave inversion    Likely demand ischemia   I will check hemoglobin A1c, lipid panel, serial troponin and monitor on telemetry.   Cardiology consult.   No aspirin due to GI bleed.  * Hypertension   Continue home medication.  * Hyperlipidemia   Continue home medication.  * Diabetes    We'll hold oral medications and keep on sliding scale coverage.  * Active smoking   Counseled to quit smoking for  4 minutes and offered nicotine patch.  All the records are reviewed and case discussed with ED provider. Management plans discussed with the patient, family and they are in agreement.  CODE STATUS: Full code. Code Status History    Date Active Date Inactive Code Status Order ID Comments User Context   07/05/2016  1:01 PM 07/08/2016  2:02 PM Full Code [id number]  Fritzi Mandes, MD Inpatient   10/13/2015  8:53 PM 10/14/2015  4:36 PM Full Code [id number]  Dustin Flock, MD Inpatient   03/10/2015  9:31 PM 03/13/2015  1:11 PM Full Code [id number]   Theodoro Grist, MD Inpatient       TOTAL TIME TAKING CARE OF THIS PATIENT: 50 minutes.    Vaughan Basta M.D on 05/10/2017   Between 7am to 6pm - Pager - [phone]  After 6pm go to [email or web address] - password [email or web address] [place]  Office  [phone]  CC: Primary care physician; Venia Carbon, MD   Note: This dictation was prepared with Dragon dictation along with smaller phrase technology. Any transcriptional errors that result from this process are unintentional.

## 2017-05-10 NOTE — ED Triage Notes (Signed)
Patient presents to ED via [facility] with c/o CP x 6 months and dizziness that began 3 weeks ago. Patient c/o central CP non radiating. Even and non labored respirations noted.

## 2017-05-10 NOTE — ED Notes (Signed)
Informed RN bed ready [phone]

## 2017-05-10 NOTE — ED Provider Notes (Signed)
[facility] Emergency Department Provider Note  ____________________________________________   First MD Initiated Contact with Patient 05/10/17 1728     (approximate)  I have reviewed the triage vital signs and the nursing notes.   HISTORY  Chief Complaint Dizziness    HPI Kathryn Ware is a 76 y.o. female who presents to the emergency Department with roughly 3 weeks of progressive exertional shortness of breath mild sharp upper chest discomfort and fatigue. She feels like her exercise tolerance has decreased significantly and when she walks across the room now she is out of breath.She's had intermittent episodes of chest pain for the past 6 months or so. She is a past medical history of diabetes mellitus hypertension and dyslipidemia. She's never had a heart attack.   03/11/15 Echo: Study Conclusions  - Left ventricle: The cavity size was normal. There was mild   concentric hypertrophy. Systolic function was normal. The   estimated ejection fraction was in the range of 60% to 65%. Wall   motion was normal; there were no regional wall motion   abnormalities. Doppler parameters are consistent with abnormal   left ventricular relaxation (grade 1 diastolic dysfunction). - Left atrium: The atrium was normal in size. - Right ventricle: Systolic function was normal. - Pulmonary arteries: Systolic pressure was within the normal   range.  Impressions:  - Normal study.   Past Medical History:  Diagnosis Date  . Anemia   . Blood transfusion without reported diagnosis   . Diabetes mellitus type II   . GI AVM (gastrointestinal arteriovenous vascular malformation)    stomach  . History of echocardiogram 3/15   a. 12/2013: EF 60-65%, DD, mild LVH, nl RV size & systolic function, mild MR/TR, mild AS, nl RVSP  . History of nuclear stress test    a. 12/2013: low risk, no sig WMA, nondiag EKG 2/2 baseline LVH w/ repol abnl, no sig ischemia, EF 70% no  artifact  . HLD (hyperlipidemia)   . HTN (hypertension)   . RLS (restless legs syndrome)     Patient Active Problem List   Diagnosis Date Noted  . Abnormal EKG 05/11/2017  . Symptomatic anemia 05/10/2017  . Malnutrition of mild degree ([place]) 03/30/2017  . Anemia 07/05/2016  . Restless legs syndrome (RLS) 02/23/2016  . 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 Anemia due to gastrointestinal blood loss 02/18/2015  . Advance directive discussed with patient 02/18/2015  . Atypical chest pain 01/14/2014  . Nicotine dependence 10/29/2013  . Routine general medical examination at a health care facility 08/17/2011  . Diabetes mellitus with complication ([place]) 38/75/6433  . HYPERCHOLESTEROLEMIA 02/02/2007  . Essential hypertension 02/02/2007  . Angiodysplasia of intestinal tract 02/02/2007    Past Surgical History:  Procedure Laterality Date  . ABDOMINAL ADHESION SURGERY  11/2001   Dr. Tamala Julian  . ABDOMINAL HYSTERECTOMY  1976   RSO (fibroid)  . COLONOSCOPY WITH PROPOFOL N/A 07/07/2016   Procedure: COLONOSCOPY WITH PROPOFOL;  Surgeon: Manya Silvas, MD;  Location: [facility] ENDOSCOPY;  Service: Endoscopy;  Laterality: N/A;  . COLONOSCOPY WITH PROPOFOL N/A 12/14/2016   Procedure: COLONOSCOPY WITH PROPOFOL;  Surgeon: Manya Silvas, MD;  Location: [facility] ENDOSCOPY;  Service: Endoscopy;  Laterality: N/A;  . DOBUTAMINE STRESS ECHO  11/10   normal  . DOPPLER ECHOCARDIOGRAPHY     EF 55%, mild MR, TR  . ESOPHAGOGASTRODUODENOSCOPY  07/2006   multiple angioectasias  . ESOPHAGOGASTRODUODENOSCOPY Left 03/12/2015   Procedure: place tube in throat and evaluate stomach  and duodenum for source of bleeding. Cauterize if concern for rebleeding.;  Surgeon: Hulen Luster, MD;  Location: [facility] ENDOSCOPY;  Service: Endoscopy;  Laterality: Left;  . ESOPHAGOGASTRODUODENOSCOPY N/A 12/14/2016   Procedure: ESOPHAGOGASTRODUODENOSCOPY (EGD);  Surgeon: Manya Silvas, MD;  Location: [facility] ENDOSCOPY;  Service:  Endoscopy;  Laterality: N/A;  . ESOPHAGOGASTRODUODENOSCOPY (EGD) WITH PROPOFOL N/A 11/23/2015   Procedure: ESOPHAGOGASTRODUODENOSCOPY (EGD) WITH PROPOFOL;  Surgeon: Hulen Luster, MD;  Location: [facility] ENDOSCOPY;  Service: Gastroenterology;  Laterality: N/A;  . ESOPHAGOGASTRODUODENOSCOPY (EGD) WITH PROPOFOL N/A 07/07/2016   Procedure: ESOPHAGOGASTRODUODENOSCOPY (EGD) WITH PROPOFOL;  Surgeon: Manya Silvas, MD;  Location: [facility] ENDOSCOPY;  Service: Endoscopy;  Laterality: N/A;  . laryngeal polyp  10/2008   Dr. Tami Ribas  . TOP      Prior to Admission medications   Medication Sig Start Date End Date Taking? Authorizing Provider  ferrous sulfate 325 (65 FE) MG tablet Take 325 mg by mouth 2 (two) times daily.    Yes [provider]  lisinopril-hydrochlorothiazide (PRINZIDE,ZESTORETIC) 20-12.5 MG tablet Take 1 tablet by mouth daily. Patient taking differently: Take 2 tablets by mouth daily.  07/20/16  Yes Venia Carbon, MD  metFORMIN (GLUCOPHAGE-XR) 500 MG 24 hr tablet Take 500 mg by mouth daily with breakfast.   Yes [provider]  omeprazole (PRILOSEC) 40 MG capsule TAKE 1 CAPSULE(40 MG) BY MOUTH TWICE DAILY BEFORE A MEAL 04/28/16  Yes Venia Carbon, MD  predniSONE (DELTASONE) 20 MG tablet Take 1 tablet (20 mg total) by mouth daily with breakfast. 04/24/17  Yes Copland, Frederico Hamman, MD  simvastatin (ZOCOR) 80 MG tablet TAKE 1 TABLET(80 MG) BY MOUTH DAILY 04/27/16  Yes Viviana Simpler I, MD  sucralfate (CARAFATE) 1 g tablet TAKE 1 TABLET BY MOUTH EVERY NIGHT BEFORE MEALS AND AT BEDTIME 03/27/17  Yes Venia Carbon, MD  vitamin B-12 (CYANOCOBALAMIN) 1000 MCG tablet Take 1,000 mcg by mouth daily.   Yes [provider]  clonazePAM (KLONOPIN) 0.5 MG tablet Take 1 tablet (0.5 mg total) by mouth daily as needed. Patient not taking: Reported on 05/10/2017 08/03/16   Venia Carbon, MD  ONE [facility] ULTRA TEST test strip USE AS DIRECTED ONCE DAILY 05/31/16   Venia Carbon, MD     Allergies Metformin and Nifedipine  Family History  Problem Relation Age of Onset  . Stroke Father   . Cancer Sister        breast cancer  . Hypertension Mother   . Hypertension Unknown        sibling  . Diabetes Unknown        grandmother    Social History Social History  Substance Use Topics  . Smoking status: Current Every Day Smoker    Packs/day: 1.00    Types: Cigarettes  . Smokeless tobacco: Never Used  . Alcohol use No    Review of Systems Constitutional: No fever/chills Eyes: No visual changes. ENT: No sore throat. Cardiovascular: Positive chest pain. Respiratory: Positive shortness of breath. Gastrointestinal: No abdominal pain.  No nausea, no vomiting.  No diarrhea.  No constipation. Genitourinary: Negative for dysuria. Musculoskeletal: Negative for back pain. Skin: Negative for rash. Neurological: Negative for headaches, focal weakness or numbness.   ____________________________________________   PHYSICAL EXAM:  VITAL SIGNS: ED Triage Vitals  Enc Vitals Group     BP 05/10/17 1240 (!) 109/43     Pulse Rate 05/10/17 1240 (!) 101     Resp 05/10/17 1240 16     Temp 05/10/17  1240 97.7 F (36.5 C)     Temp Source 05/10/17 1240 Oral     SpO2 05/10/17 1240 100 %     Weight 05/10/17 1241 115 lb (52.2 kg)     Height 05/10/17 1241 5\' 4"  (1.626 m)     Head Circumference --      Peak Flow --      Pain Score 05/10/17 1243 5     Pain Loc --      Pain Edu? --      Excl. in [place]? --     Constitutional: Alert and oriented 4 appears tired nontoxic no diaphoresis speaks in full clear sentences Eyes: PERRL EOMI. Head: Atraumatic. Nose: No congestion/rhinnorhea. Mouth/Throat: No trismus Neck: No stridor.   Cardiovascular: Normal rate, regular rhythm.3/6 SEM  Good peripheral circulation. Respiratory: Normal respiratory effort.  No retractions. Lungs CTAB and moving good air Gastrointestinal: Soft nontender Musculoskeletal: No lower extremity edema    Neurologic:  Normal speech and language. No gross focal neurologic deficits are appreciated. Skin:  Skin is warm, dry and intact. No rash noted. Psychiatric: Mood and affect are normal. Speech and behavior are normal.    ____________________________________________   DIFFERENTIAL includes but not limited to  Acute coronary syndrome, pulmonary embolus, congestive heart failure, pneumothorax, anemia, hypothyroid ____________________________________________   LABS (all labs ordered are listed, but only abnormal results are displayed)  Labs Reviewed  BASIC METABOLIC PANEL - Abnormal; Notable for the following:       Result Value   Glucose, Bld 223 (*)    BUN 41 (*)    Creatinine, Ser 1.12 (*)    GFR calc non Af Amer 47 (*)    GFR calc Af Amer 54 (*)    All other components within normal limits  CBC - Abnormal; Notable for the following:    RBC 2.42 (*)    Hemoglobin 7.1 (*)    HCT 22.0 (*)    RDW 15.5 (*)    All other components within normal limits  LIPID PANEL - Abnormal; Notable for the following:    HDL 40 (*)    All other components within normal limits  BASIC METABOLIC PANEL - Abnormal; Notable for the following:    Chloride 114 (*)    BUN 34 (*)    GFR calc non Af Amer 54 (*)    All other components within normal limits  CBC - Abnormal; Notable for the following:    RBC 3.11 (*)    Hemoglobin 8.4 (*)    HCT 25.9 (*)    RDW 20.6 (*)    All other components within normal limits  GLUCOSE, CAPILLARY - Abnormal; Notable for the following:    Glucose-Capillary 139 (*)    All other components within normal limits  GLUCOSE, CAPILLARY - Abnormal; Notable for the following:    Glucose-Capillary 115 (*)    All other components within normal limits  TROPONIN I  TROPONIN I  TROPONIN I  TROPONIN I  TROPONIN I  GLUCOSE, CAPILLARY  HEMOGLOBIN A1C  TYPE AND SCREEN  PREPARE RBC (CROSSMATCH)    First troponin negative the patient is significantly  anemic __________________________________________  EKG  ED ECG REPORT I, Darel Hong, the attending physician, personally viewed and interpreted this ECG.  Date: 05/10/2017 EKG Time:  Rate: 108 Rhythm: Sinus tachycardia QRS Axis: normal Intervals: normal ST/T Wave abnormalities: Repolarization abnormalities Narrative Interpretation: Left ventricular hypertrophy with repolarization abnormalities but no signs of acute ischemia  ____________________________________________  RADIOLOGY  Chest x-ray shows no acute disease ____________________________________________   PROCEDURES  Procedure(s) performed: no  Procedures  Critical Care performed: no  Observation: no ____________________________________________   INITIAL IMPRESSION / ASSESSMENT AND PLAN / ED COURSE  Pertinent labs & imaging results that were available during my care of the patient were reviewed by me and considered in my medical decision making (see chart for details).  The patient arrives with exertional shortness of breath and fatigue as well as intermittent chest pain. She has multiple cardiac risk factors although her hemoglobin is very low today. She requires inpatient admission for full cardiac rule out as well as blood transfusion given her symptomatic anemia.      ____________________________________________   FINAL CLINICAL IMPRESSION(S) / ED DIAGNOSES  Final diagnoses:  Chest pain, unspecified type  Symptomatic anemia      NEW MEDICATIONS STARTED DURING THIS VISIT:  Current Discharge Medication List       Note:  This document was prepared using Dragon voice recognition software and may include unintentional dictation errors.     Darel Hong, MD 05/11/17 1414

## 2017-05-10 NOTE — Plan of Care (Signed)
Problem: Education: Goal: Knowledge of [facility] Education information/materials will improve Outcome: Progressing Pt likes to be called Kathryn Ware  Past Medical History:  Diagnosis Date  . Anemia   . Blood transfusion without reported diagnosis   . Diabetes mellitus type II   . GI AVM (gastrointestinal arteriovenous vascular malformation)    stomach  . History of echocardiogram 3/15   a. 12/2013: EF 60-65%, DD, mild LVH, nl RV size & systolic function, mild MR/TR, mild AS, nl RVSP  . History of nuclear stress test    a. 12/2013: low risk, no sig WMA, nondiag EKG 2/2 baseline LVH w/ repol abnl, no sig ischemia, EF 70% no artifact  . HLD (hyperlipidemia)   . HTN (hypertension)   . RLS (restless legs syndrome)    Pt is well controlled with home medications

## 2017-05-11 ENCOUNTER — Inpatient Hospital Stay (HOSPITAL_COMMUNITY)
Admit: 2017-05-11 | Discharge: 2017-05-11 | Disposition: A | Discharge status: Home / Self Care | Payer: Medicare Other | Attending: Physician Assistant | Admitting: Physician Assistant

## 2017-05-11 DIAGNOSIS — R0789 Other chest pain: Secondary | ICD-10-CM

## 2017-05-11 DIAGNOSIS — R9431 Abnormal electrocardiogram [ECG] [EKG]: Secondary | ICD-10-CM | POA: Diagnosis present

## 2017-05-11 DIAGNOSIS — I340 Nonrheumatic mitral (valve) insufficiency: Secondary | ICD-10-CM

## 2017-05-11 DIAGNOSIS — D649 Anemia, unspecified: Secondary | ICD-10-CM

## 2017-05-11 DIAGNOSIS — D500 Iron deficiency anemia secondary to blood loss (chronic): Principal | ICD-10-CM

## 2017-05-11 LAB — BASIC METABOLIC PANEL
ANION GAP: 5 (ref 5–15)
BUN: 34 mg/dL — ABNORMAL HIGH (ref 6–20)
CHLORIDE: 114 mmol/L — AB (ref 101–111)
CO2: 22 mmol/L (ref 22–32)
Calcium: 8.9 mg/dL (ref 8.9–10.3)
Creatinine, Ser: 1 mg/dL (ref 0.44–1.00)
GFR calc Af Amer: >60 mL/min (ref >60)
GFR, EST NON AFRICAN AMERICAN: 54 mL/min — AB (ref >60)
GLUCOSE: 77 mg/dL (ref 65–99)
POTASSIUM: 4.1 mmol/L (ref 3.5–5.1)
Sodium: 141 mmol/L (ref 135–145)

## 2017-05-11 LAB — ECHOCARDIOGRAM COMPLETE
E decel time: 310 msec
E/e' ratio: 18.71
FS: 53 % — AB (ref 28–44)
HEIGHTINCHES: 64 in
IVS/LV PW RATIO, ED: 1.19
LA diam index: 2.07 cm/m2
LASIZE: 32 mm
LAVOL: 38.1 mL
LAVOLA4C: 33.3 mL
LAVOLIN: 24.6 mL/m2
LEFT ATRIUM END SYS DIAM: 32 mm
LV PW d: 9.5 mm — AB (ref 0.6–1.1)
LV TDI E'MEDIAL: 4.68
LV e' LATERAL: 6.2 cm/s
LVEEAVG: 18.71
LVEEMED: 18.71
Lateral S' vel: 12.1 cm/s
MV Dec: 310
MV Peak grad: 5 mmHg
MVAP: 2.42 cm2
MVPKAVEL: 133 m/s
MVPKEVEL: 116 m/s
P 1/2 time: 91 ms
RV TAPSE: 20.8 mm
TDI e' lateral: 6.2
WEIGHTICAEL: 1875.2 [oz_av]

## 2017-05-11 LAB — CBC
HEMATOCRIT: 25.9 % — AB (ref 35.0–47.0)
HEMOGLOBIN: 8.4 g/dL — AB (ref 12.0–16.0)
MCH: 27 pg (ref 26.0–34.0)
MCHC: 32.4 g/dL (ref 32.0–36.0)
MCV: 83.3 fL (ref 80.0–100.0)
Platelets: 209 10*3/uL (ref 150–440)
RBC: 3.11 MIL/uL — AB (ref 3.80–5.20)
RDW: 20.6 % — ABNORMAL HIGH (ref 11.5–14.5)
WBC: 7.2 10*3/uL (ref 3.6–11.0)

## 2017-05-11 LAB — IRON AND TIBC
Iron: 93 ug/dL (ref 28–170)
SATURATION RATIOS: 24 % (ref 10.4–31.8)
TIBC: 388 ug/dL (ref 250–450)
UIBC: 295 ug/dL

## 2017-05-11 LAB — GLUCOSE, CAPILLARY
GLUCOSE-CAPILLARY: 98 mg/dL (ref 65–99)
Glucose-Capillary: 77 mg/dL (ref 65–99)
Glucose-Capillary: 115 mg/dL — ABNORMAL HIGH (ref 65–99)
Glucose-Capillary: 139 mg/dL — ABNORMAL HIGH (ref 65–99)

## 2017-05-11 LAB — HEMOGLOBIN: Hemoglobin: 10.2 g/dL — ABNORMAL LOW (ref 12.0–16.0)

## 2017-05-11 LAB — TROPONIN I: Troponin I: <0.03 ng/mL (ref <0.03)

## 2017-05-11 LAB — FERRITIN: FERRITIN: 30 ng/mL (ref 11–307)

## 2017-05-11 LAB — FOLATE: FOLATE: 15.5 ng/mL (ref >5.9)

## 2017-05-11 NOTE — Progress Notes (Signed)
Red band placed for allergies. Yellow band placed for moderate fall precautions

## 2017-05-11 NOTE — Progress Notes (Signed)
Initial Nutrition Assessment  DOCUMENTATION CODES:   Not applicable  INTERVENTION:  Recommend Ensure Enlive po BID with diet advancement, each supplement provides 350 kcal and 20 grams of protein. Encouraged patient to drink ONS at home after discharge to help meet calorie/protein needs.  Encouraged adequate intake of calories and protein at meals. Discussed other quick meals patient can pack for work for breakfast and lunch to ensure she can meet her calorie/protein needs.  NUTRITION DIAGNOSIS:   Inadequate oral intake related to social / environmental circumstances (limited time to eat at work) as evidenced by per patient/family report.  GOAL:   Patient will meet greater than or equal to 90% of their needs  MONITOR:   PO intake, Supplement acceptance, Labs, Weight trends, I & O's  REASON FOR ASSESSMENT:   Malnutrition Screening Tool    ASSESSMENT:   76 year old female with PMHx of DM type 2, HTN, HLD, GI AVM admitted with symptomatic anemia.   Spoke with patient at bedside. She reports her appetite is good. She has been eating less for a while now because she went back to work. Because of her work schedule she will oftentimes skip breakfast or just eat a small snack such as peanut butter crackers. Her lunch at work is usually snack foods as well. For dinner she will eat chicken, grits, and gravy.   Patient repots she was 140 lbs one year ago. Per chart patient was only 123-126 lbs back to 2016. She has remained weight stable this past year per chart.  Meal Completion: 25-100% of CLD  Medications reviewed and include: ferrous sulfate 325 mg BID, Novolog 0-9 units TID, pantoprazole, Carafate, vitamin B12 1000 micrograms daily, NS @ 75 ml/hr.  Labs reviewed: CBG 77-115, Chloride 114, BUN 34.   Nutrition-Focused physical exam completed. Findings are severe fat depletion, mild-moderate muscle depletion (temple, posterior calf region), and mild edema.   Patient does not meet  criteria for malnutrition at this time.  Diet Order:  Diet clear liquid Room service appropriate? Yes; Fluid consistency: Thin  Skin:  Reviewed, no issues  Last BM:  05/10/2017  Height:   Ht Readings from Last 1 Encounters:  05/10/17 5\' 4"  (1.626 m)    Weight:   Wt Readings from Last 1 Encounters:  05/10/17 117 lb 3.2 oz (53.2 kg)    Ideal Body Weight:  54.5 kg  BMI:  Body mass index is 20.12 kg/m.  Estimated Nutritional Needs:   Kcal:  1330-1595 (25-30 kcal/kg)  Protein:  65-90 grams (1.2-1.4 grams/kg)  Fluid:  1.3 L/day (25 ml/kg)  EDUCATION NEEDS:   No education needs identified at this time  Willey Blade, MS, RD, LDN Pager: [phone] After Hours Pager: [phone]

## 2017-05-11 NOTE — Plan of Care (Signed)
Problem: Bowel/Gastric: Goal: Will show no signs and symptoms of gastrointestinal bleeding Outcome: Progressing Dr Vicente Males saw this pm / pt went  For  Echo this late pm/ seem by cardio this pm.  No s/s dizziness.  Steady gait. Denies any c/o chest pain.

## 2017-05-11 NOTE — Consult Note (Signed)
[name] MD, MRCP(U.K) [address]  [place]  [place], [place] [address]  Main: [phone]  Fax: [phone]  Consultation  Referring Provider:    Dr Posey Pronto  Primary Care Physician:  Venia Carbon, MD Primary Gastroenterologist:  Dr. Tiffany Kocher         Reason for Consultation:     GI bleed   Date of Admission:  05/10/2017 Date of Consultation:  05/11/2017         HPI:   Kathryn Ware is a 76 y.o. female who is known to DR Tiffany Kocher in the past , last seen at his clinic in 08/2016 . She was admitted at the hospital back in 06/2016 and underwent an EGD/colonoscopy - found to have 5 AVM's in the duodenum and treated . Plan was for a repeat endoscopy in 12/2016 . She has had a tubular  adenoma excised per colonoscopy in 11/2016, internal hemorroids were also seen  .11/2016 underwent repeat EGD where a single AVM of duodenum was treated . An older EGD in 2016 by Dr Candace Cruise showed multiple AVM's in the duodenum which were also treated with APC.   I do not see any prior capsule study performed for evaluation of AVMS's of the small bowel.  Hb 03/30/17 9.4 grams with MCV 86 .She Has been treated for iron deficiency anemia .  She presented to the hospital yesterday after she noted that her stool had been dark for the past few days , she says she is on iron and hence always has a dark stool, no abdominal pain, no vomiting , no use of SNAID's , felt dizzy . In the ER was noted with a drop of Hb from her baseline, new EKG changes and admitted. On admission her Hb was 7.1 grams with MCV of 91.   No new complaints presently. She denies any prior small bowel capsule study .      Past Medical History:  Diagnosis Date  . Anemia   . Blood transfusion without reported diagnosis   . Diabetes mellitus type II   . GI AVM (gastrointestinal arteriovenous vascular malformation)    stomach  . History of echocardiogram 3/15   a. 12/2013: EF 60-65%, DD, mild LVH, nl RV size & systolic function, mild MR/TR,  mild AS, nl RVSP  . History of nuclear stress test    a. 12/2013: low risk, no sig WMA, nondiag EKG 2/2 baseline LVH w/ repol abnl, no sig ischemia, EF 70% no artifact  . HLD (hyperlipidemia)   . HTN (hypertension)   . RLS (restless legs syndrome)     Past Surgical History:  Procedure Laterality Date  . ABDOMINAL ADHESION SURGERY  11/2001   Dr. Tamala Julian  . ABDOMINAL HYSTERECTOMY  1976   RSO (fibroid)  . COLONOSCOPY WITH PROPOFOL N/A 07/07/2016   Procedure: COLONOSCOPY WITH PROPOFOL;  Surgeon: Manya Silvas, MD;  Location: [facility] ENDOSCOPY;  Service: Endoscopy;  Laterality: N/A;  . COLONOSCOPY WITH PROPOFOL N/A 12/14/2016   Procedure: COLONOSCOPY WITH PROPOFOL;  Surgeon: Manya Silvas, MD;  Location: [facility] ENDOSCOPY;  Service: Endoscopy;  Laterality: N/A;  . DOBUTAMINE STRESS ECHO  11/10   normal  . DOPPLER ECHOCARDIOGRAPHY     EF 55%, mild MR, TR  . ESOPHAGOGASTRODUODENOSCOPY  07/2006   multiple angioectasias  . ESOPHAGOGASTRODUODENOSCOPY Left 03/12/2015   Procedure: place tube in throat and evaluate stomach and duodenum for source of bleeding. Cauterize if concern for rebleeding.;  Surgeon: Hulen Luster, MD;  Location:  [facility] ENDOSCOPY;  Service: Endoscopy;  Laterality: Left;  . ESOPHAGOGASTRODUODENOSCOPY N/A 12/14/2016   Procedure: ESOPHAGOGASTRODUODENOSCOPY (EGD);  Surgeon: Manya Silvas, MD;  Location: [facility] ENDOSCOPY;  Service: Endoscopy;  Laterality: N/A;  . ESOPHAGOGASTRODUODENOSCOPY (EGD) WITH PROPOFOL N/A 11/23/2015   Procedure: ESOPHAGOGASTRODUODENOSCOPY (EGD) WITH PROPOFOL;  Surgeon: Hulen Luster, MD;  Location: [facility] ENDOSCOPY;  Service: Gastroenterology;  Laterality: N/A;  . ESOPHAGOGASTRODUODENOSCOPY (EGD) WITH PROPOFOL N/A 07/07/2016   Procedure: ESOPHAGOGASTRODUODENOSCOPY (EGD) WITH PROPOFOL;  Surgeon: Manya Silvas, MD;  Location: [facility] ENDOSCOPY;  Service: Endoscopy;  Laterality: N/A;  . laryngeal polyp  10/2008   Dr. Tami Ribas  . TOP      Prior to Admission medications     Medication Sig Start Date End Date Taking? Authorizing Provider  ferrous sulfate 325 (65 FE) MG tablet Take 325 mg by mouth 2 (two) times daily.    Yes [provider]  lisinopril-hydrochlorothiazide (PRINZIDE,ZESTORETIC) 20-12.5 MG tablet Take 1 tablet by mouth daily. Patient taking differently: Take 2 tablets by mouth daily.  07/20/16  Yes Venia Carbon, MD  metFORMIN (GLUCOPHAGE-XR) 500 MG 24 hr tablet Take 500 mg by mouth daily with breakfast.   Yes [provider]  omeprazole (PRILOSEC) 40 MG capsule TAKE 1 CAPSULE(40 MG) BY MOUTH TWICE DAILY BEFORE A MEAL 04/28/16  Yes Venia Carbon, MD  predniSONE (DELTASONE) 20 MG tablet Take 1 tablet (20 mg total) by mouth daily with breakfast. 04/24/17  Yes Copland, Frederico Hamman, MD  simvastatin (ZOCOR) 80 MG tablet TAKE 1 TABLET(80 MG) BY MOUTH DAILY 04/27/16  Yes Viviana Simpler I, MD  sucralfate (CARAFATE) 1 g tablet TAKE 1 TABLET BY MOUTH EVERY NIGHT BEFORE MEALS AND AT BEDTIME 03/27/17  Yes Venia Carbon, MD  vitamin B-12 (CYANOCOBALAMIN) 1000 MCG tablet Take 1,000 mcg by mouth daily.   Yes [provider]  clonazePAM (KLONOPIN) 0.5 MG tablet Take 1 tablet (0.5 mg total) by mouth daily as needed. Patient not taking: Reported on 05/10/2017 08/03/16   Venia Carbon, MD  ONE [facility] ULTRA TEST test strip USE AS DIRECTED ONCE DAILY 05/31/16   Venia Carbon, MD    Family History  Problem Relation Age of Onset  . Stroke Father   . Cancer Sister        breast cancer  . Hypertension Mother   . Hypertension Unknown        sibling  . Diabetes Unknown        grandmother     Social History  Substance Use Topics  . Smoking status: Current Every Day Smoker    Packs/day: 1.00    Types: Cigarettes  . Smokeless tobacco: Never Used  . Alcohol use No    Allergies as of 05/10/2017 - Review Complete 05/10/2017  Allergen Reaction Noted  . Metformin  09/05/2006  . Nifedipine  09/05/2006    Review of Systems:    All  systems reviewed and negative except where noted in HPI.   Physical Exam:  Vital signs in last 24 hours: Temp:  [97.7 F (36.5 C)-98.6 F (37 C)] 98.6 F (37 C) (07/19 1205) Pulse Rate:  [64-97] 79 (07/19 1205) Resp:  [14-20] 20 (07/19 1205) BP: (114-147)/(43-85) 140/44 (07/19 1205) SpO2:  [100 %] 100 % (07/19 1205) Weight:  [117 lb 3.2 oz (53.2 kg)] 117 lb 3.2 oz (53.2 kg) (07/18 1933) Last BM Date: 05/10/17 General:   Pleasant, cooperative in NAD Head:  Normocephalic and atraumatic. Eyes:   No icterus.   Conjunctiva  pink. PERRLA. Ears:  Normal auditory acuity. Neck:  Supple; no masses or thyroidomegaly Lungs: Respirations even and unlabored. Lungs clear to auscultation bilaterally.   No wheezes, crackles, or rhonchi.  Heart:  Regular rate and rhythm; non radiating systolic murmur in the aortic area no clicks, rubs or gallops Abdomen:  Soft, nondistended, nontender. Normal bowel sounds. No appreciable masses or hepatomegaly.  No rebound or guarding.  Rectal:  Not performed. Extremities:  Without edema, cyanosis or clubbing. Neurologic:  Alert and oriented x3;  grossly normal neurologically. Skin:  Intact without significant lesions or rashes. Psych:  Alert and cooperative. Normal affect.  LAB RESULTS:  Recent Labs  05/10/17 1242 05/11/17 0239  WBC 6.0 7.2  HGB 7.1* 8.4*  HCT 22.0* 25.9*  PLT 255 209   BMET  Recent Labs  05/10/17 1242 05/11/17 0239  NA 140 141  K 4.5 4.1  CL 109 114*  CO2 23 22  GLUCOSE 223* 77  BUN 41* 34*  CREATININE 1.12* 1.00  CALCIUM 9.1 8.9   LFT No results for input(s): PROT, ALBUMIN, AST, ALT, ALKPHOS, BILITOT, BILIDIR, IBILI in the last 72 hours. PT/INR No results for input(s): LABPROT, INR in the last 72 hours.  STUDIES: Dg Chest 2 View  Result Date: 05/10/2017 CLINICAL DATA:  Chest pain, dizziness, tired for 4 months, increased symptoms today, diabetes, smoker EXAM: CHEST  2 VIEW COMPARISON:  07/05/2016 FINDINGS: Normal heart  size, mediastinal contours, and pulmonary vascularity. Atherosclerotic calcification aorta. Emphysematous and minimal bronchitic changes consistent with COPD. Minimal biapical scarring. No acute infiltrate, pleural effusion or pneumothorax. Bones diffusely demineralized with dextroconvex scoliosis of the thoracolumbar spine. IMPRESSION: COPD changes with mild biapical scarring. No acute abnormalities. Aortic Atherosclerosis (ICD10-I70.0) and Emphysema (ICD10-J43.9). Electronically Signed   By: Lavonia Dana M.D.   On: 05/10/2017 13:48      Impression / Plan:   Kathryn Ware is a 76 y.o. y/o female with long standing history of iron deficiency anemia, multiple episodes of treatment of proximal small bowel AVM's by EGD, presents to the hospital with an acute drop in HB, black colored stool, diziness and new EKG changes. Very likely has had an episode of acute on chronic bleed from small bowel AVM's.    Plan  1. Transfuse  2. Check iron studies and if low will need IV iron  3. She has been seen by cardiology and plan for echocardiogram to evaluate LV function and outpatient stress test . Once transfused and if cleared by cardiology we can plan for a push enteroscopy  on Saturday to ablate any duodenal/proximal jejunal AVM's  4. Unclear if a small bowel capsule study has ever been done to look for any AVMS and that would be something to follow up with Dr Tiffany Kocher as an out patient as she may have AVM's there too which cause her to be chronically anemic. She does have an aortic stenotic murmur on examination and AVMS' are associated with aortic stenosis.  5. PPI   Thank you for involving me in the care of this patient.      LOS: 1 day   [name], MD  05/11/2017, 4:23 PM

## 2017-05-11 NOTE — Progress Notes (Addendum)
[facility] [place] NAME: Kathryn Ware    MR#:  [id number]  DATE OF BIRTH:  Mar 10, 1941  SUBJECTIVE:  Came in with increasing weakness and dizziness found to have hemoglobin of 7. She is status post 2 unit blood transfusion. No active GI bleed. Has dark stools but takes iron supplements at home.  REVIEW OF SYSTEMS:   Review of Systems  Constitutional: Negative for chills, fever and weight loss.  HENT: Negative for ear discharge, ear pain and nosebleeds.   Eyes: Negative for blurred vision, pain and discharge.  Respiratory: Negative for sputum production, shortness of breath, wheezing and stridor.   Cardiovascular: Negative for chest pain, palpitations, orthopnea and PND.  Gastrointestinal: Negative for abdominal pain, diarrhea, nausea and vomiting.  Genitourinary: Negative for frequency and urgency.  Musculoskeletal: Negative for back pain and joint pain.  Neurological: Positive for weakness. Negative for sensory change, speech change and focal weakness.  Psychiatric/Behavioral: Negative for depression and hallucinations. The patient is not nervous/anxious.    Tolerating Diet:yes Tolerating PT: Not needed  DRUG ALLERGIES:   Allergies  Allergen Reactions  . Metformin     REACTION: diarrhea when dosage is increased, patient aware that she is onmetformin when it is listed that she is allergic  . Nifedipine     REACTION: cough    VITALS:  Blood pressure (!) 140/44, pulse 79, temperature 98.6 F (37 C), temperature source Oral, resp. rate 20, height 5\' 4"  (1.626 m), weight 53.2 kg (117 lb 3.2 oz), SpO2 100 %.  PHYSICAL EXAMINATION:   Physical Exam  GENERAL:  76 y.o.-year-old patient lying in the bed with no acute distress.  EYES: Pupils equal, round, reactive to light and accommodation. No scleral icterus. Extraocular muscles intact. Pallor++ HEENT: Head atraumatic, normocephalic. Oropharynx and nasopharynx clear.  NECK:   Supple, no jugular venous distention. No thyroid enlargement, no tenderness.  LUNGS: Normal breath sounds bilaterally, no wheezing, rales, rhonchi. No use of accessory muscles of respiration.  CARDIOVASCULAR: S1, S2 normal. No murmurs, rubs, or gallops.  ABDOMEN: Soft, nontender, nondistended. Bowel sounds present. No organomegaly or mass.  EXTREMITIES: No cyanosis, clubbing or edema b/l.    NEUROLOGIC: Cranial nerves II through XII are intact. No focal Motor or sensory deficits b/l.   PSYCHIATRIC:  patient is alert and oriented x 3.  SKIN: No obvious rash, lesion, or ulcer.   LABORATORY PANEL:  CBC  Recent Labs Lab 05/11/17 0239  WBC 7.2  HGB 8.4*  HCT 25.9*  PLT 209    Chemistries   Recent Labs Lab 05/11/17 0239  NA 141  K 4.1  CL 114*  CO2 22  GLUCOSE 77  BUN 34*  CREATININE 1.00  CALCIUM 8.9   Cardiac Enzymes  Recent Labs Lab 05/11/17 0239  TROPONINI <0.03   RADIOLOGY:  Dg Chest 2 View  Result Date: 05/10/2017 CLINICAL DATA:  Chest pain, dizziness, tired for 4 months, increased symptoms today, diabetes, smoker EXAM: CHEST  2 VIEW COMPARISON:  07/05/2016 FINDINGS: Normal heart size, mediastinal contours, and pulmonary vascularity. Atherosclerotic calcification aorta. Emphysematous and minimal bronchitic changes consistent with COPD. Minimal biapical scarring. No acute infiltrate, pleural effusion or pneumothorax. Bones diffusely demineralized with dextroconvex scoliosis of the thoracolumbar spine. IMPRESSION: COPD changes with mild biapical scarring. No acute abnormalities. Aortic Atherosclerosis (ICD10-I70.0) and Emphysema (ICD10-J43.9). Electronically Signed   By: Lavonia Dana M.D.   On: 05/10/2017 13:48   ASSESSMENT AND PLAN:   Kathryn Ware  is  a 76 y.o. female with a known history of Anemia, blood transfusion, endoscopy and colonoscopy was done in February 2018 with finding of some polyps and some vascular ectasia in duodenum. Required blood transfusion in the  past, hyperlipidemia, hypertension, diabetes- noted her stool is dark for last few days and is feeling dizzy   1. Slow GI bleed suspected due to known Duodenal/gastric AVM's -came in with hgb 7.1---2 units BT---8.4 -feels ok -GI input noted. Plans for EGD once Cardiac clearance obtained  2.Dizziness with CP appears atypical in the setting of anemia -troponin's negative -EKG no acute changes -Echo pending -Seen by Dr Fletcher Anon....will discuss if ok to go for GI w/u  3.HTN -resume home meds  4.DVT prophylaxis scd's  Case discussed with Care Management/Social Worker. Management plans discussed with the patient, family and they are in agreement.  CODE STATUS: full  DVT Prophylaxis: scd  TOTAL TIME TAKING CARE OF THIS PATIENT: 30 minutes.  >50% time spent on counselling and coordination of care  POSSIBLE D/C IN *1-2* DAYS, DEPENDING ON CLINICAL CONDITION.  Note: This dictation was prepared with Dragon dictation along with smaller phrase technology. Any transcriptional errors that result from this process are unintentional.  Kathryn Ware M.D on 05/11/2017 at 4:58 PM  Between 7am to 6pm - Pager - [phone]  After 6pm go to [email or web address] - password [email or web address] [facility]  Office  [phone]  CC: Primary care physician; Venia Carbon, MD

## 2017-05-11 NOTE — Progress Notes (Signed)
Off unit at this time for echo

## 2017-05-11 NOTE — Consult Note (Signed)
Cardiology Consultation Note  Patient ID: Kathryn Ware, MRN: [id number], DOB/AGE: 1941/01/25 76 y.o. Admit date: 05/10/2017   Date of Consult: 05/11/2017 Primary Physician: Venia Carbon, MD Primary Cardiologist: New to [facility] - consult by Fletcher Anon Requesting Physician: Dr. Anselm Jungling, MD  Chief Complaint: Dizziness Reason for Consult: Chest pain  HPI: Kathryn Ware is a 76 y.o. female who is being seen today for the evaluation of chest pain at the request of Dr. Anselm Jungling, MD. Patient has a h/o known GI AV malformation with history of blood loss anemia requiring blood transfusions in the past, DM2, HTN, HLD, and ongoing tobacco abuse who presented to [facility] on 7/18 with dizziness and chest pain.  No prior known cardiac history. Prior nuclear stress test in 2015 low risk as below. Prior echo in 02/2015 showed normal EF, normal wall motion, GR1DD, PASP normal. Over the past few days she had noted melena with associated dizziness and fleeting intermittent chest pain that would last for ~ 10-20 seconds before self resolving. Some associated SOB and palpitations. She presented to [facility] and was noted to have a low HGB of 7.1 s/p 2 units pRBC with HGB this morning of 8.4 and improvement in her dizziness and chest pain. Vitals were stable. She was also noted to have inferolateral TWI on EKG (prior lateral TWI noted on old EKGs). Troponin negative x 4. K+ 4.1, SCr 1.12. CXR with COPD. Cardiology is asked to evaluate her chest pain and EKG changes. Currently, asymptomatic.     Past Medical History:  Diagnosis Date  . Anemia   . Blood transfusion without reported diagnosis   . Diabetes mellitus type II   . GI AVM (gastrointestinal arteriovenous vascular malformation)    stomach  . History of echocardiogram 3/15   a. 12/2013: EF 60-65%, DD, mild LVH, nl RV size & systolic function, mild MR/TR, mild AS, nl RVSP  . History of nuclear stress test    a. 12/2013: low risk, no sig WMA, nondiag EKG 2/2 baseline LVH  w/ repol abnl, no sig ischemia, EF 70% no artifact  . HLD (hyperlipidemia)   . HTN (hypertension)   . RLS (restless legs syndrome)       Most Recent Cardiac Studies: Nuclear stress test 12/2013: Summary   1. No significant wall motion abnormality noted.   2. Non diagnostic ECG due to baseline LVH with repolarization  abnormalities.   3. Overall, this is a Low risk scan.   4. Pharmacological myocardial perfusion study with no significant  ischemia.   5. The estimated ejection fraction is 70%.   6. The left ventricular global function was normal.   7. There are no EKG changes concerning for ischemia.   8. There is no artifact noted on this study.   TTE 02/2015: Study Conclusions  - Left ventricle: The cavity size was normal. There was mild   concentric hypertrophy. Systolic function was normal. The   estimated ejection fraction was in the range of 60% to 65%. Wall   motion was normal; there were no regional wall motion   abnormalities. Doppler parameters are consistent with abnormal   left ventricular relaxation (grade 1 diastolic dysfunction). - Left atrium: The atrium was normal in size. - Right ventricle: Systolic function was normal. - Pulmonary arteries: Systolic pressure was within the normal   range.  Impressions:  - Normal study.   Surgical History:  Past Surgical History:  Procedure Laterality Date  . ABDOMINAL ADHESION SURGERY  11/2001  Dr. Tamala Julian  . ABDOMINAL HYSTERECTOMY  1976   RSO (fibroid)  . COLONOSCOPY WITH PROPOFOL N/A 07/07/2016   Procedure: COLONOSCOPY WITH PROPOFOL;  Surgeon: Manya Silvas, MD;  Location: [facility] ENDOSCOPY;  Service: Endoscopy;  Laterality: N/A;  . COLONOSCOPY WITH PROPOFOL N/A 12/14/2016   Procedure: COLONOSCOPY WITH PROPOFOL;  Surgeon: Manya Silvas, MD;  Location: [facility] ENDOSCOPY;  Service: Endoscopy;  Laterality: N/A;  . DOBUTAMINE STRESS ECHO  11/10   normal  . DOPPLER ECHOCARDIOGRAPHY     EF 55%, mild MR, TR  .  ESOPHAGOGASTRODUODENOSCOPY  07/2006   multiple angioectasias  . ESOPHAGOGASTRODUODENOSCOPY Left 03/12/2015   Procedure: place tube in throat and evaluate stomach and duodenum for source of bleeding. Cauterize if concern for rebleeding.;  Surgeon: Hulen Luster, MD;  Location: [facility] ENDOSCOPY;  Service: Endoscopy;  Laterality: Left;  . ESOPHAGOGASTRODUODENOSCOPY N/A 12/14/2016   Procedure: ESOPHAGOGASTRODUODENOSCOPY (EGD);  Surgeon: Manya Silvas, MD;  Location: [facility] ENDOSCOPY;  Service: Endoscopy;  Laterality: N/A;  . ESOPHAGOGASTRODUODENOSCOPY (EGD) WITH PROPOFOL N/A 11/23/2015   Procedure: ESOPHAGOGASTRODUODENOSCOPY (EGD) WITH PROPOFOL;  Surgeon: Hulen Luster, MD;  Location: [facility] ENDOSCOPY;  Service: Gastroenterology;  Laterality: N/A;  . ESOPHAGOGASTRODUODENOSCOPY (EGD) WITH PROPOFOL N/A 07/07/2016   Procedure: ESOPHAGOGASTRODUODENOSCOPY (EGD) WITH PROPOFOL;  Surgeon: Manya Silvas, MD;  Location: [facility] ENDOSCOPY;  Service: Endoscopy;  Laterality: N/A;  . laryngeal polyp  10/2008   Dr. Tami Ribas  . TOP       Home Meds: Prior to Admission medications   Medication Sig Start Date End Date Taking? Authorizing Provider  ferrous sulfate 325 (65 FE) MG tablet Take 325 mg by mouth 2 (two) times daily.    Yes [provider]  lisinopril-hydrochlorothiazide (PRINZIDE,ZESTORETIC) 20-12.5 MG tablet Take 1 tablet by mouth daily. Patient taking differently: Take 2 tablets by mouth daily.  07/20/16  Yes Venia Carbon, MD  metFORMIN (GLUCOPHAGE-XR) 500 MG 24 hr tablet Take 500 mg by mouth daily with breakfast.   Yes [provider]  omeprazole (PRILOSEC) 40 MG capsule TAKE 1 CAPSULE(40 MG) BY MOUTH TWICE DAILY BEFORE A MEAL 04/28/16  Yes Venia Carbon, MD  predniSONE (DELTASONE) 20 MG tablet Take 1 tablet (20 mg total) by mouth daily with breakfast. 04/24/17  Yes Copland, Frederico Hamman, MD  simvastatin (ZOCOR) 80 MG tablet TAKE 1 TABLET(80 MG) BY MOUTH DAILY 04/27/16  Yes Viviana Simpler I, MD    sucralfate (CARAFATE) 1 g tablet TAKE 1 TABLET BY MOUTH EVERY NIGHT BEFORE MEALS AND AT BEDTIME 03/27/17  Yes Venia Carbon, MD  vitamin B-12 (CYANOCOBALAMIN) 1000 MCG tablet Take 1,000 mcg by mouth daily.   Yes [provider]  clonazePAM (KLONOPIN) 0.5 MG tablet Take 1 tablet (0.5 mg total) by mouth daily as needed. Patient not taking: Reported on 05/10/2017 08/03/16   Venia Carbon, MD  ONE [facility] ULTRA TEST test strip USE AS DIRECTED ONCE DAILY 05/31/16   Venia Carbon, MD    Inpatient Medications:  . atorvastatin  40 mg Oral q1800  . ferrous sulfate  325 mg Oral BID WC  . lisinopril  20 mg Oral Daily   And  . hydrochlorothiazide  12.5 mg Oral Daily  . insulin aspart  0-9 Units Subcutaneous TID WC  . pantoprazole (PROTONIX) IV  40 mg Intravenous Q12H  . sucralfate  1 g Oral TID WC & HS  . vitamin B-12  1,000 mcg Oral Daily   . sodium chloride 75 mL/hr at 05/11/17 0500  Allergies:  Allergies  Allergen Reactions  . Metformin     REACTION: diarrhea when dosage is increased, patient aware that she is onmetformin when it is listed that she is allergic  . Nifedipine     REACTION: cough    Social History   Social History  . Marital status: Widowed    Spouse name: N/A  . Number of children: 1  . Years of education: N/A   Occupational History  . Seamstress         Social History Main Topics  . Smoking status: Current Every Day Smoker    Packs/day: 1.00    Types: Cigarettes  . Smokeless tobacco: Never Used  . Alcohol use No  . Drug use: No  . Sexual activity: Not on file   Other Topics Concern  . Not on file   Social History Narrative   No living will   Requests son Orson Gear as health care POA   Would accept resuscitation --but no prolonged machines   Not sure about feeding tubes     Family History  Problem Relation Age of Onset  . Stroke Father   . Cancer Sister        breast cancer  . Hypertension Mother   . Hypertension Unknown         sibling  . Diabetes Unknown        grandmother     Review of Systems: Review of Systems  Constitutional: Positive for malaise/fatigue. Negative for chills, diaphoresis, fever and weight loss.  HENT: Negative for congestion.   Eyes: Negative for discharge and redness.  Respiratory: Positive for shortness of breath. Negative for cough, hemoptysis, sputum production and wheezing.   Cardiovascular: Positive for chest pain and palpitations. Negative for orthopnea, claudication, leg swelling and PND.  Gastrointestinal: Negative for abdominal pain, blood in stool, heartburn, melena, nausea and vomiting.  Genitourinary: Negative for hematuria.  Musculoskeletal: Negative for falls and myalgias.  Skin: Negative for rash.  Neurological: Positive for dizziness and weakness. Negative for tingling, tremors, sensory change, speech change, focal weakness and loss of consciousness.  Endo/Heme/Allergies: Does not bruise/bleed easily.  Psychiatric/Behavioral: Negative for substance abuse. The patient is not nervous/anxious.   All other systems reviewed and are negative.   Labs:  Recent Labs  05/10/17 1805 05/10/17 1842 05/10/17 2254 05/11/17 0239  TROPONINI <0.03 <0.03 <0.03 <0.03   Lab Results  Component Value Date   WBC 7.2 05/11/2017   HGB 8.4 (L) 05/11/2017   HCT 25.9 (L) 05/11/2017   MCV 83.3 05/11/2017   PLT 209 05/11/2017     Recent Labs Lab 05/11/17 0239  NA 141  K 4.1  CL 114*  CO2 22  BUN 34*  CREATININE 1.00  CALCIUM 8.9  GLUCOSE 77   Lab Results  Component Value Date   CHOL 138 05/10/2017   HDL 40 (L) 05/10/2017   LDLCALC 72 05/10/2017   TRIG 132 05/10/2017   No results found for: DDIMER  Radiology/Studies:  Dg Chest 2 View  Result Date: 05/10/2017 IMPRESSION: COPD changes with mild biapical scarring. No acute abnormalities. Aortic Atherosclerosis (ICD10-I70.0) and Emphysema (ICD10-J43.9). Electronically Signed   By: Lavonia Dana M.D.   On: 05/10/2017  13:48    EKG: Interpreted by me showed: sinus tachycardia, 108 bpm, LAE, LVH, inferolateral TWI Telemetry: Interpreted by me showed: NSR to sinus tachycardia, 80s to 120s bpm, TWI  Weights: [facility] Weights   05/10/17 1241 05/10/17 1933  Weight: 115 lb (52.2 kg) 117  lb 3.2 oz (53.2 kg)     Physical Exam: Blood pressure 114/69, pulse 64, temperature 98.1 F (36.7 C), temperature source Oral, resp. rate 20, height 5\' 4"  (1.626 m), weight 117 lb 3.2 oz (53.2 kg), SpO2 100 %. Body mass index is 20.12 kg/m. General: Well developed, well nourished, in no acute distress. Head: Normocephalic, atraumatic, sclera non-icteric, no xanthomas, nares are without discharge.  Neck: Negative for carotid bruits. JVD not elevated. Lungs: Clear bilaterally to auscultation without wheezes, rales, or rhonchi. Breathing is unlabored. Heart: RRR with S1 S2. II/VI systolic murmur, no rubs, or gallops appreciated. Abdomen: Soft, non-tender, non-distended with normoactive bowel sounds. No hepatomegaly. No rebound/guarding. No obvious abdominal masses. Msk:  Strength and tone appear normal for age. Extremities: No clubbing or cyanosis. No edema. Distal pedal pulses are 2+ and equal bilaterally. Neuro: Alert and oriented X 3. No facial asymmetry. No focal deficit. Moves all extremities spontaneously. Psych:  Responds to questions appropriately with a normal affect.    Assessment and Plan:  Principal Problem:   Symptomatic anemia Active Problems:   Angiodysplasia of intestinal tract   Anemia due to gastrointestinal blood loss   Duodenal arteriovenous malformation   Diabetes mellitus with complication (HCC)   HYPERCHOLESTEROLEMIA   Essential hypertension   Atypical chest pain   Abnormal EKG   Nicotine dependence    1. Atypical chest pain/abnormal EKG: -Has ruled out -Symptoms improved with blood transfusion x 2 -Likely in the setting of her symptomatic anemia -Recommend maintaining HGB > 8.5 -Check  TTE -Consider inpatient Lexiscan if she is going to remain admitted, otherwise follow up as outpatient -No ASA currently given ongoing GIB  2. Symptomatic anemia/GI AV malformation: -As above -Per IM -GI has been consulted by IM  3. Cardiac murmur: -Possibly flow murmur 2/2 her anemia -Check TTE  4. Remaining per IM    Signed, Christell Faith, PA-C [facility] Pager: [phone] 05/11/2017, 10:30 AM

## 2017-05-12 ENCOUNTER — Telehealth: Payer: Self-pay | Admitting: Cardiovascular Disease

## 2017-05-12 ENCOUNTER — Other Ambulatory Visit: Payer: Self-pay | Admitting: *Deleted

## 2017-05-12 ENCOUNTER — Inpatient Hospital Stay
Admission: RE | Admit: 2017-05-12 | Discharge: 2017-05-12 | Disposition: A | Discharge status: Home / Self Care | Payer: Self-pay | Source: Ambulatory Visit | Attending: *Deleted | Admitting: *Deleted

## 2017-05-12 ENCOUNTER — Other Ambulatory Visit: Payer: Self-pay

## 2017-05-12 DIAGNOSIS — Z9289 Personal history of other medical treatment: Secondary | ICD-10-CM

## 2017-05-12 LAB — BPAM RBC
Blood Product Expiration Date: 201808012359
Blood Product Expiration Date: 201808012359
ISSUE DATE / TIME: 201807182116
ISSUE DATE / TIME: 201807190116
Unit Type and Rh: 7300
Unit Type and Rh: 7300

## 2017-05-12 LAB — TYPE AND SCREEN
ABO/RH(D): B POS
ANTIBODY SCREEN: NEGATIVE
UNIT DIVISION: 0
UNIT DIVISION: 0

## 2017-05-12 LAB — GLUCOSE, CAPILLARY: Glucose-Capillary: 105 mg/dL — ABNORMAL HIGH (ref 65–99)

## 2017-05-12 LAB — HEMOGLOBIN A1C
Hgb A1c MFr Bld: 4.9 % (ref 4.8–5.6)
Mean Plasma Glucose: 94 mg/dL

## 2017-05-12 LAB — VITAMIN B12: Vitamin B-12: 3052 pg/mL — ABNORMAL HIGH (ref 180–914)

## 2017-05-12 MED ORDER — PANTOPRAZOLE SODIUM 40 MG PO TBEC
40.0000 mg | DELAYED_RELEASE_TABLET | Freq: Every day | ORAL | Status: DC
  Administered 2017-05-12: 40 mg via ORAL
  Filled 2017-05-12: qty 1

## 2017-05-12 NOTE — Progress Notes (Signed)
   From a cardiac standpoint, given her echo with preserved EF, normal wall motion, and negative troponins, she is cleared for low-risk EGD at low to moderate risk given her comorbities.

## 2017-05-12 NOTE — Telephone Encounter (Signed)
Patient contacted regarding discharge from [facility] on 7/20.  Patient understands to follow up with provider Christell Faith, PA-C on 05/25/17 at 2:30pm at [facility] [place]. Patient understands discharge instructions? yes Patient understands medications and regiment? yes Patient understands to bring all medications to this visit? yes  Provided directions to the office.

## 2017-05-12 NOTE — Progress Notes (Signed)
Patient discharged home per MD order. All discharge instructions given and all questions answered. 

## 2017-05-12 NOTE — Telephone Encounter (Signed)
TCM ... Patient is being dishcarged today  She was Dr Fletcher Anon and Thurmond Butts in hospital  She is coming on 05/25/17 to see Thurmond Butts

## 2017-05-12 NOTE — Discharge Summary (Signed)
[facility] NAME: Kathryn Ware    MR#:  [id number]  DATE OF BIRTH:  02/16/1941  DATE OF ADMISSION:  05/10/2017 ADMITTING PHYSICIAN: Vaughan Basta, MD  DATE OF DISCHARGE: 05/12/17  PRIMARY CARE PHYSICIAN: Venia Carbon, MD    ADMISSION DIAGNOSIS:  Symptomatic anemia [D64.9] Chest pain, unspecified type [R07.9]  DISCHARGE DIAGNOSIS:  Symptomatic anemia due to slow GI bleed. Patient has history of duodenal AVM Acute on chronic anemia status post 2 unit blood transfusion SECONDARY DIAGNOSIS:   Past Medical History:  Diagnosis Date  . Anemia   . Blood transfusion without reported diagnosis   . Diabetes mellitus type II   . GI AVM (gastrointestinal arteriovenous vascular malformation)    stomach  . History of echocardiogram 3/15   a. 12/2013: EF 60-65%, DD, mild LVH, nl RV size & systolic function, mild MR/TR, mild AS, nl RVSP  . History of nuclear stress test    a. 12/2013: low risk, no sig WMA, nondiag EKG 2/2 baseline LVH w/ repol abnl, no sig ischemia, EF 70% no artifact  . HLD (hyperlipidemia)   . HTN (hypertension)   . RLS (restless legs syndrome)     HOSPITAL COURSE:  Kathryn Ware a 76 y.o.femalewith a known history of Anemia, blood transfusion, endoscopy and colonoscopy was done in February 2018 with finding of some polyps and some vascular ectasia in duodenum. Required blood transfusion in the past, hyperlipidemia, hypertension, diabetes- noted her stool is dark for last few days and is feeling dizzy   1. Slow GI bleed suspected due to known Duodenal/gastric AVM's -came in with hgb 7.1---2 units BT---8.4--10.2 -feels ok, no active bleeding. -GI input noted.  -Patient is not keen on waiting one more day to get endoscopy done. Dr. Vicente Males is super busy with his procedurestoday and can schedule procedure tomorrow. Patient anxious to go home. I spoke with Dr. Vira Agar. Dr. Vira Agar will see Mrs. Ricard Dillon on  Monday, July 23 at 8:30 AM and schedule outpatient workup with endoscopy. This was relayed to patient she was understanding and will see Dr. Bjorn Loser Monday. -We'll get her soft diet. -We'll Protonix.  2.Dizziness with CP appears atypical in the setting of anemia -troponin's negative -EKG no acute changes -Echo pending -Seen by Dr Fletcher Anon.--- Cardiology wise patient okay to get procedure done. This was discussed with Dr. Fletcher Anon  3.HTN -resume home meds  4.DVT prophylaxis scd's  Discharge home with outpatient follow-up with Dr. Vira Agar on Monday, July 23 CONSULTS OBTAINED:  Treatment Team:  Lucilla Lame, MD  DRUG ALLERGIES:   Allergies  Allergen Reactions  . Metformin     REACTION: diarrhea when dosage is increased, patient aware that she is onmetformin when it is listed that she is allergic  . Nifedipine     REACTION: cough    DISCHARGE MEDICATIONS:   Current Discharge Medication List    CONTINUE these medications which have NOT CHANGED   Details  ferrous sulfate 325 (65 FE) MG tablet Take 325 mg by mouth 2 (two) times daily.     lisinopril-hydrochlorothiazide (PRINZIDE,ZESTORETIC) 20-12.5 MG tablet Take 1 tablet by mouth daily. Qty: 1 tablet, Refills: 0    metFORMIN (GLUCOPHAGE-XR) 500 MG 24 hr tablet Take 500 mg by mouth daily with breakfast.    omeprazole (PRILOSEC) 40 MG capsule TAKE 1 CAPSULE(40 MG) BY MOUTH TWICE DAILY BEFORE A MEAL Qty: 180 capsule, Refills: 3    predniSONE (DELTASONE) 20 MG tablet Take 1 tablet (20  mg total) by mouth daily with breakfast. Qty: 10 tablet, Refills: 0    simvastatin (ZOCOR) 80 MG tablet TAKE 1 TABLET(80 MG) BY MOUTH DAILY Qty: 90 tablet, Refills: 3    sucralfate (CARAFATE) 1 g tablet TAKE 1 TABLET BY MOUTH EVERY NIGHT BEFORE MEALS AND AT BEDTIME Qty: 120 tablet, Refills: 0    vitamin B-12 (CYANOCOBALAMIN) 1000 MCG tablet Take 1,000 mcg by mouth daily.    clonazePAM (KLONOPIN) 0.5 MG tablet Take 1 tablet (0.5 mg total) by  mouth daily as needed. Qty: 30 tablet, Refills: 0    ONE TOUCH ULTRA TEST test strip USE AS DIRECTED ONCE DAILY Qty: 100 each, Refills: 3        If you experience worsening of your admission symptoms, develop shortness of breath, life threatening emergency, suicidal or homicidal thoughts you must seek medical attention immediately by calling 911 or calling your MD immediately  if symptoms less severe.  You Must read complete instructions/literature along with all the possible adverse reactions/side effects for all the Medicines you take and that have been prescribed to you. Take any new Medicines after you have completely understood and accept all the possible adverse reactions/side effects.   Please note  You were cared for by a hospitalist during your hospital stay. If you have any questions about your discharge medications or the care you received while you were in the hospital after you are discharged, you can call the unit and asked to speak with the hospitalist on call if the hospitalist that took care of you is not available. Once you are discharged, your primary care physician will handle any further medical issues. Please note that NO REFILLS for any discharge medications will be authorized once you are discharged, as it is imperative that you return to your primary care physician (or establish a relationship with a primary care physician if you do not have one) for your aftercare needs so that they can reassess your need for medications and monitor your lab values. Today   SUBJECTIVE   Doing well. Anxious to go home.  VITAL SIGNS:  Blood pressure (!) 133/56, pulse 86, temperature 98.4 F (36.9 C), resp. rate 20, height 5\' 4"  (1.626 m), weight 53.2 kg (117 lb 3.2 oz), SpO2 100 %.  I/O:   Intake/Output Summary (Last 24 hours) at 05/12/17 0907 Last data filed at 05/11/17 1857  Gross per 24 hour  Intake             1802 ml  Output                0 ml  Net             1802 ml     PHYSICAL EXAMINATION:  GENERAL:  76 y.o.-year-old patient lying in the bed with no acute distress.  EYES: Pupils equal, round, reactive to light and accommodation. No scleral icterus. Extraocular muscles intact.  HEENT: Head atraumatic, normocephalic. Oropharynx and nasopharynx clear.  NECK:  Supple, no jugular venous distention. No thyroid enlargement, no tenderness.  LUNGS: Normal breath sounds bilaterally, no wheezing, rales,rhonchi or crepitation. No use of accessory muscles of respiration.  CARDIOVASCULAR: S1, S2 normal. No murmurs, rubs, or gallops.  ABDOMEN: Soft, non-tender, non-distended. Bowel sounds present. No organomegaly or mass.  EXTREMITIES: No pedal edema, cyanosis, or clubbing.  NEUROLOGIC: Cranial nerves II through XII are intact. Muscle strength 5/5 in all extremities. Sensation intact. Gait not checked.  PSYCHIATRIC: The patient is alert and oriented x 3.  SKIN: No obvious rash, lesion, or ulcer.   DATA REVIEW:   CBC   Recent Labs Lab 05/11/17 0239 05/11/17 2006  WBC 7.2  --   HGB 8.4* 10.2*  HCT 25.9*  --   PLT 209  --     Chemistries   Recent Labs Lab 05/11/17 0239  NA 141  K 4.1  CL 114*  CO2 22  GLUCOSE 77  BUN 34*  CREATININE 1.00  CALCIUM 8.9    Microbiology Results   No results found for this or any previous visit (from the past 240 hour(s)).  RADIOLOGY:  Dg Chest 2 View  Result Date: 05/10/2017 CLINICAL DATA:  Chest pain, dizziness, tired for 4 months, increased symptoms today, diabetes, smoker EXAM: CHEST  2 VIEW COMPARISON:  07/05/2016 FINDINGS: Normal heart size, mediastinal contours, and pulmonary vascularity. Atherosclerotic calcification aorta. Emphysematous and minimal bronchitic changes consistent with COPD. Minimal biapical scarring. No acute infiltrate, pleural effusion or pneumothorax. Bones diffusely demineralized with dextroconvex scoliosis of the thoracolumbar spine. IMPRESSION: COPD changes with mild biapical scarring.  No acute abnormalities. Aortic Atherosclerosis (ICD10-I70.0) and Emphysema (ICD10-J43.9). Electronically Signed   By: Lavonia Dana M.D.   On: 05/10/2017 13:48     Management plans discussed with the patient, family and they are in agreement.  CODE STATUS:     Code Status Orders        Start     Ordered   05/10/17 2001  Full code  Continuous     05/10/17 2000    Code Status History    Date Active Date Inactive Code Status Order ID Comments User Context   07/05/2016  1:01 PM 07/08/2016  2:02 PM Full Code [id number]  Fritzi Mandes, MD Inpatient   10/13/2015  8:53 PM 10/14/2015  4:36 PM Full Code [id number]  Dustin Flock, MD Inpatient   03/10/2015  9:31 PM 03/13/2015  1:11 PM Full Code [id number]  Theodoro Grist, MD Inpatient      TOTAL TIME TAKING CARE OF THIS PATIENT: *40* minutes.    Rehanna Oloughlin M.D on 05/12/2017 at 9:07 AM  Between 7am to 6pm - Pager - [phone] After 6pm go to [email or web address] - password [email or web address] [facility]  Office  [phone]  CC: Primary care physician; Venia Carbon, MD

## 2017-05-12 NOTE — Telephone Encounter (Signed)
Currently admitted.

## 2017-05-12 NOTE — Progress Notes (Signed)
Patient refuses bed alarm. Re-Educated on purpose of bed alarm.

## 2017-05-12 NOTE — Progress Notes (Signed)
Red allergy arm band on. Yellow falls arm band on.

## 2017-05-15 DIAGNOSIS — D509 Iron deficiency anemia, unspecified: Secondary | ICD-10-CM | POA: Diagnosis not present

## 2017-05-15 NOTE — Telephone Encounter (Signed)
Thank you!!! Orders changed and forwarded to Dr Silvio Pate to sign upon his return.

## 2017-05-15 NOTE — Telephone Encounter (Signed)
img [phone] img [phone]

## 2017-05-15 NOTE — Telephone Encounter (Signed)
Kathryn Ware  Can you change mammogram orders img [phone] img [phone]  [place] will contact pt to schedule appointment per [facility]

## 2017-05-18 ENCOUNTER — Encounter: Payer: Self-pay | Admitting: *Deleted

## 2017-05-19 ENCOUNTER — Encounter: Payer: Self-pay | Admitting: *Deleted

## 2017-05-19 ENCOUNTER — Ambulatory Visit
Admission: RE | Admit: 2017-05-19 | Discharge: 2017-05-19 | Disposition: A | Discharge status: Home / Self Care | Payer: Medicare Other | Source: Ambulatory Visit | Attending: Unknown Physician Specialty | Admitting: Unknown Physician Specialty

## 2017-05-19 ENCOUNTER — Ambulatory Visit: Payer: Medicare Other | Admitting: Certified Registered"

## 2017-05-19 ENCOUNTER — Encounter
Admission: RE | Disposition: A | Discharge status: Home / Self Care | Payer: Self-pay | Source: Ambulatory Visit | Attending: Unknown Physician Specialty

## 2017-05-19 DIAGNOSIS — K449 Diaphragmatic hernia without obstruction or gangrene: Secondary | ICD-10-CM | POA: Diagnosis not present

## 2017-05-19 DIAGNOSIS — Z86010 Personal history of colonic polyps: Secondary | ICD-10-CM | POA: Insufficient documentation

## 2017-05-19 DIAGNOSIS — Z7984 Long term (current) use of oral hypoglycemic drugs: Secondary | ICD-10-CM | POA: Diagnosis not present

## 2017-05-19 DIAGNOSIS — F17210 Nicotine dependence, cigarettes, uncomplicated: Secondary | ICD-10-CM | POA: Insufficient documentation

## 2017-05-19 DIAGNOSIS — D509 Iron deficiency anemia, unspecified: Secondary | ICD-10-CM | POA: Diagnosis not present

## 2017-05-19 DIAGNOSIS — I10 Essential (primary) hypertension: Secondary | ICD-10-CM | POA: Insufficient documentation

## 2017-05-19 DIAGNOSIS — K3189 Other diseases of stomach and duodenum: Secondary | ICD-10-CM | POA: Diagnosis not present

## 2017-05-19 DIAGNOSIS — K766 Portal hypertension: Secondary | ICD-10-CM | POA: Insufficient documentation

## 2017-05-19 DIAGNOSIS — Z888 Allergy status to other drugs, medicaments and biological substances status: Secondary | ICD-10-CM | POA: Diagnosis not present

## 2017-05-19 DIAGNOSIS — E119 Type 2 diabetes mellitus without complications: Secondary | ICD-10-CM | POA: Diagnosis not present

## 2017-05-19 DIAGNOSIS — Z79899 Other long term (current) drug therapy: Secondary | ICD-10-CM | POA: Diagnosis not present

## 2017-05-19 DIAGNOSIS — K2970 Gastritis, unspecified, without bleeding: Secondary | ICD-10-CM | POA: Diagnosis not present

## 2017-05-19 DIAGNOSIS — G2581 Restless legs syndrome: Secondary | ICD-10-CM | POA: Insufficient documentation

## 2017-05-19 DIAGNOSIS — K2950 Unspecified chronic gastritis without bleeding: Secondary | ICD-10-CM | POA: Diagnosis not present

## 2017-05-19 DIAGNOSIS — E7800 Pure hypercholesterolemia, unspecified: Secondary | ICD-10-CM | POA: Diagnosis not present

## 2017-05-19 HISTORY — DX: Pure hypercholesterolemia, unspecified: E78.00

## 2017-05-19 HISTORY — DX: Benign neoplasm of colon, unspecified: D12.6

## 2017-05-19 HISTORY — DX: Acute kidney failure, unspecified: N17.9

## 2017-05-19 HISTORY — PX: ESOPHAGOGASTRODUODENOSCOPY (EGD) WITH PROPOFOL: SHX5813

## 2017-05-19 HISTORY — DX: Iron deficiency anemia, unspecified: D50.9

## 2017-05-19 HISTORY — DX: Gastrointestinal hemorrhage, unspecified: K92.2

## 2017-05-19 LAB — GLUCOSE, CAPILLARY: GLUCOSE-CAPILLARY: 113 mg/dL — AB (ref 65–99)

## 2017-05-19 SURGERY — ESOPHAGOGASTRODUODENOSCOPY (EGD) WITH PROPOFOL
Anesthesia: General

## 2017-05-19 MED ORDER — SODIUM CHLORIDE 0.9 % IV SOLN: INTRAVENOUS | Status: DC

## 2017-05-19 MED ORDER — PROPOFOL 10 MG/ML IV BOLUS
INTRAVENOUS | Status: DC | PRN
  Administered 2017-05-19: 50 mg via INTRAVENOUS

## 2017-05-19 MED ORDER — PROPOFOL 500 MG/50ML IV EMUL
INTRAVENOUS | Status: AC
  Filled 2017-05-19: qty 50

## 2017-05-19 MED ORDER — MIDAZOLAM HCL 2 MG/2ML IJ SOLN
INTRAMUSCULAR | Status: DC | PRN
  Administered 2017-05-19: 1 mg via INTRAVENOUS

## 2017-05-19 MED ORDER — SODIUM CHLORIDE 0.9 % IV SOLN
INTRAVENOUS | Status: DC
  Administered 2017-05-19: 1000 mL via INTRAVENOUS

## 2017-05-19 MED ORDER — LIDOCAINE HCL (CARDIAC) 20 MG/ML IV SOLN
INTRAVENOUS | Status: DC | PRN
  Administered 2017-05-19: 50 mg via INTRAVENOUS

## 2017-05-19 MED ORDER — MIDAZOLAM HCL 2 MG/2ML IJ SOLN
INTRAMUSCULAR | Status: AC
  Filled 2017-05-19: qty 2

## 2017-05-19 MED ORDER — PROPOFOL 500 MG/50ML IV EMUL
INTRAVENOUS | Status: DC | PRN
  Administered 2017-05-19: 100 ug/kg/min via INTRAVENOUS

## 2017-05-19 NOTE — Op Note (Addendum)
[facility] Gastroenterology Patient Name: Kathryn Ware Procedure Date: 05/19/2017 9:02 AM MRN: [id number] Account #: [id number] Date of Birth: Mar 29, 1941 Admit Type: Outpatient Age: 76 Room: [facility] ENDO ROOM 1 Gender: Female Note Status: Finalized Procedure:            Upper GI endoscopy Indications:          Iron deficiency anemia Providers:            Manya Silvas, MD Referring MD:         Venia Carbon (Referring MD) Medicines:            Propofol per Anesthesia Complications:        No immediate complications. Procedure:            Pre-Anesthesia Assessment:                       - After reviewing the risks and benefits, the patient                        was deemed in satisfactory condition to undergo the                        procedure.                       After obtaining informed consent, the endoscope was                        passed under direct vision. Throughout the procedure,                        the patient's blood pressure, pulse, and oxygen                        saturations were monitored continuously. The Endoscope                        was introduced through the mouth, and advanced to the                        second part of duodenum. The upper GI endoscopy was                        accomplished without difficulty. The patient tolerated                        the procedure well. Findings:      The examined esophagus was normal.      A small hiatal hernia was present.      Localized mild inflammation characterized by erythema and granularity       was found on the greater curvature of the gastric body.      Mild portal hypertensive gastropathy was found in the gastric body.      The examined duodenum was normal. Impression:           - Normal esophagus.                       - Small hiatal hernia.                       - Gastritis.                       -  Portal hypertensive gastropathy.                       - Normal examined  duodenum.                       - No specimens collected. Recommendation:       - The findings and recommendations were discussed with                        the patient's family. Stay on iron pills. Manya Silvas, MD 05/19/2017 9:15:32 AM This report has been signed electronically. Number of Addenda: 0 Note Initiated On: 05/19/2017 9:02 AM      [facility]

## 2017-05-19 NOTE — Anesthesia Procedure Notes (Signed)
Performed by: Carlie Solorzano Pre-anesthesia Checklist: Patient identified, Emergency Drugs available, Suction available, Patient being monitored and Timeout performed Patient Re-evaluated:Patient Re-evaluated prior to induction Oxygen Delivery Method: Nasal cannula Preoxygenation: Pre-oxygenation with 100% oxygen Induction Type: IV induction       

## 2017-05-19 NOTE — Transfer of Care (Signed)
Immediate Anesthesia Transfer of Care Note  Patient: Kathryn Ware  Procedure(s) Performed: Procedure(s): ESOPHAGOGASTRODUODENOSCOPY (EGD) WITH PROPOFOL (N/A)  Patient Location: PACU  Anesthesia Type:General  Level of Consciousness: sedated  Airway & Oxygen Therapy: Patient Spontanous Breathing and Patient connected to nasal cannula oxygen  Post-op Assessment: Report given to RN and Post -op Vital signs reviewed and stable  Post vital signs: Reviewed and stable  Last Vitals:  Vitals:   05/19/17 0743 05/19/17 0919  BP: (!) 110/54 (!) 96/54  Pulse: 86 73  Resp: 20 12  Temp: (!) 35.8 C     Last Pain:  Vitals:   05/19/17 0743  TempSrc: Tympanic         Complications: No apparent anesthesia complications

## 2017-05-19 NOTE — Anesthesia Post-op Follow-up Note (Cosign Needed)
Anesthesia QCDR form completed.        

## 2017-05-19 NOTE — H&P (Signed)
Primary Care Physician:  Venia Carbon, MD Primary Gastroenterologist:  Dr. Vira Agar  Pre-Procedure History & Physical: HPI:  Kathryn Ware is a 76 y.o. female is here for an endoscopy.   Past Medical History:  Diagnosis Date  . Anemia   . ARF (acute renal failure) ([place])   . Blood transfusion without reported diagnosis   . Diabetes mellitus type II   . GI (gastrointestinal bleed)   . GI AVM (gastrointestinal arteriovenous vascular malformation)    stomach  . History of echocardiogram 3/15   a. 12/2013: EF 60-65%, DD, mild LVH, nl RV size & systolic function, mild MR/TR, mild AS, nl RVSP  . History of nuclear stress test    a. 12/2013: low risk, no sig WMA, nondiag EKG 2/2 baseline LVH w/ repol abnl, no sig ischemia, EF 70% no artifact  . HLD (hyperlipidemia)   . HTN (hypertension)   . Hypercholesterolemia   . IDA (iron deficiency anemia)   . Polyp of colon, adenomatous   . RLS (restless legs syndrome)     Past Surgical History:  Procedure Laterality Date  . ABDOMINAL ADHESION SURGERY  11/2001   Dr. Tamala Julian  . ABDOMINAL HYSTERECTOMY  1976   RSO (fibroid)  . COLONOSCOPY WITH PROPOFOL N/A 07/07/2016   Procedure: COLONOSCOPY WITH PROPOFOL;  Surgeon: Manya Silvas, MD;  Location: [facility] ENDOSCOPY;  Service: Endoscopy;  Laterality: N/A;  . COLONOSCOPY WITH PROPOFOL N/A 12/14/2016   Procedure: COLONOSCOPY WITH PROPOFOL;  Surgeon: Manya Silvas, MD;  Location: [facility] ENDOSCOPY;  Service: Endoscopy;  Laterality: N/A;  . DOBUTAMINE STRESS ECHO  11/10   normal  . DOPPLER ECHOCARDIOGRAPHY     EF 55%, mild MR, TR  . ESOPHAGOGASTRODUODENOSCOPY  07/2006   multiple angioectasias  . ESOPHAGOGASTRODUODENOSCOPY Left 03/12/2015   Procedure: place tube in throat and evaluate stomach and duodenum for source of bleeding. Cauterize if concern for rebleeding.;  Surgeon: Hulen Luster, MD;  Location: [facility] ENDOSCOPY;  Service: Endoscopy;  Laterality: Left;  . ESOPHAGOGASTRODUODENOSCOPY N/A 12/14/2016    Procedure: ESOPHAGOGASTRODUODENOSCOPY (EGD);  Surgeon: Manya Silvas, MD;  Location[facility] ENDOSCOPY;  Service: Endoscopy;  Laterality: N/A;  . ESOPHAGOGASTRODUODENOSCOPY (EGD) WITH PROPOFOL N/A 11/23/2015   Procedure: ESOPHAGOGASTRODUODENOSCOPY (EGD) WITH PROPOFOL;  Surgeon: Hulen Luster, MD;  Location[facility] END[facility]COPY;  Service: Gastroenterology;  Laterality: N/A;  . ESOPHAGOGASTRODUODENOSCOPY (EGD) WITH PROPOFOL N/A 07/07/2016   Procedure: ESOPHAGOGASTRODUODENOSCOPY (EGD) WITH PROPOFOL;  Surgeon: Manya Silvas, MD;  Location[facility] ENDOSCOPY;  Service: Endoscopy;  Laterality: N/A;  . laryngeal polyp  10/2008   Dr. Tami Ribas  . TOP      Prior to Admission medications   Medication Sig Start Date End Date Taking? Authorizing Provider  clonazePAM (KLONOPIN) 0.5 MG tablet Take 1 tablet (0.5 mg total) by mouth daily as needed. 08/03/16  Yes Viviana Simpler I, MD  ferrous sulfate 325 (65 FE) MG tablet Take 325 mg by mouth 2 (two) times daily.    Yes [provider]  lisinopril-hydrochlorothiazide (PRINZIDE,ZESTORETIC) 20-12.5 MG tablet Take 1 tablet by mouth daily. Patient taking differently: Take 2 tablets by mouth daily.  07/20/16  Yes Venia Carbon, MD  metFORMIN (GLUCOPHAGE-XR) 500 MG 24 hr tablet Take 500 mg by mouth daily with breakfast.   Yes [provider]  omeprazole (PRILOSEC) 40 MG capsule TAKE 1 CAPSULE(40 MG) BY MOUTH TWICE DAILY BEFORE A MEAL 04/28/16  Yes Venia Carbon, MD  ONE TOUCH ULTRA TEST test strip USE AS DIRECTED ONCE  DAILY 05/31/16  Yes Venia Carbon, MD  predniSONE (DELTASONE) 20 MG tablet Take 1 tablet (20 mg total) by mouth daily with breakfast. 04/24/17  Yes Copland, Frederico Hamman, MD  simvastatin (ZOCOR) 80 MG tablet TAKE 1 TABLET(80 MG) BY MOUTH DAILY 04/27/16  Yes Venia Carbon, MD  sucralfate (CARAFATE) 1 g tablet TAKE 1 TABLET BY MOUTH EVERY NIGHT BEFORE MEALS AND AT BEDTIME 03/27/17  Yes Venia Carbon, MD  vitamin B-12 (CYANOCOBALAMIN) 1000 MCG  tablet Take 1,000 mcg by mouth daily.   Yes [provider]    Allergies as of 05/15/2017 - Review Complete 05/10/2017  Allergen Reaction Noted  . Metformin  09/05/2006  . Nifedipine  09/05/2006    Family History  Problem Relation Age of Onset  . Stroke Father   . Cancer Sister        breast cancer  . Hypertension Mother   . Hypertension Unknown        sibling  . Diabetes Unknown        grandmother    Social History   Social History  . Marital status: Widowed    Spouse name: N/A  . Number of children: 1  . Years of education: N/A   Occupational History  . Seamstress         Social History Main Topics  . Smoking status: Current Every Day Smoker    Packs/day: 1.00    Types: Cigarettes  . Smokeless tobacco: Never Used  . Alcohol use No  . Drug use: No  . Sexual activity: Not on file   Other Topics Concern  . Not on file   Social History Narrative   No living will   Requests son Orson Gear as health care POA   Would accept resuscitation --but no prolonged machines   Not sure about feeding tubes    Review of Systems: See HPI, otherwise negative ROS  Physical Exam: BP (!) 110/54   Pulse 86   Temp (!) 96.4 F (35.8 C) (Tympanic)   Resp 20   Ht 5\' 4"  (1.626 m)   Wt 52.2 kg (115 lb)   SpO2 100%   BMI 19.74 kg/m  General:   Alert,  pleasant and cooperative in NAD Head:  Normocephalic and atraumatic. Neck:  Supple; no masses or thyromegaly. Lungs:  Clear throughout to auscultation.    Heart:  Regular rate and rhythm. Abdomen:  Soft, nontender and nondistended. Normal bowel sounds, without guarding, and without rebound.   Neurologic:  Alert and  oriented x4;  grossly normal neurologically.  Impression/Plan: Kathryn Ware is here for an endoscopy to be performed for iron def anemia.  Risks, benefits, limitations, and alternatives regarding  endoscopy have been reviewed with the patient.  Questions have been answered.  All parties  agreeable.   Gaylyn Cheers, MD  05/19/2017, 9:00 AM

## 2017-05-19 NOTE — Anesthesia Preprocedure Evaluation (Signed)
Anesthesia Evaluation  Patient identified by MRN, date of birth, ID band Patient awake    Reviewed: Allergy & Precautions, H&P , NPO status , Patient's Chart, lab work & pertinent test results, reviewed documented beta blocker date and time   Airway Mallampati: II   Neck ROM: full    Dental  (+) Poor Dentition   Pulmonary neg pulmonary ROS, Current Smoker,    Pulmonary exam normal        Cardiovascular Exercise Tolerance: Poor hypertension, On Medications (-) angina(-) Orthopnea and (-) PND negative cardio ROS Normal cardiovascular exam Rhythm:regular Rate:Normal  Echo noted.     Neuro/Psych negative neurological ROS  negative psych ROS   GI/Hepatic negative GI ROS, Neg liver ROS,   Endo/Other  negative endocrine ROSdiabetes  Renal/GU Renal diseasenegative Renal ROS  negative genitourinary   Musculoskeletal   Abdominal   Peds  Hematology negative hematology ROS (+) anemia ,   Anesthesia Other Findings Past Medical History: No date: Anemia No date: ARF (acute renal failure) ([facility]) No date: Blood transfusion without reported diagnosis No date: Diabetes mellitus type II No date: GI (gastrointestinal bleed) No date: GI AVM (gastrointestinal arteriovenous vascular malformation)     Comment:  stomach 3/15: History of echocardiogram     Comment:  a. 12/2013: EF 60-65%, DD, mild LVH, nl RV size &               systolic function, mild MR/TR, mild AS, nl RVSP No date: History of nuclear stress test     Comment:  a. 12/2013: low risk, no sig WMA, nondiag EKG 2/2               baseline LVH w/ repol abnl, no sig ischemia, EF 70% no               artifact No date: HLD (hyperlipidemia) No date: HTN (hypertension) No date: Hypercholesterolemia No date: IDA (iron deficiency anemia) No date: Polyp of colon, adenomatous No date: RLS (restless legs syndrome) Past Surgical History: 11/2001: ABDOMINAL ADHESION SURGERY      Comment:  Dr. Tamala Julian 1976: ABDOMINAL HYSTERECTOMY     Comment:  RSO (fibroid) 07/07/2016: COLONOSCOPY WITH PROPOFOL; N/A     Comment:  Procedure: COLONOSCOPY WITH PROPOFOL;  Surgeon: Manya Silvas, MD;  Location: [facility] ENDOSCOPY;  Service:               Endoscopy;  Laterality: N/A; 12/14/2016: COLONOSCOPY WITH PROPOFOL; N/A     Comment:  Procedure: COLONOSCOPY WITH PROPOFOL;  Surgeon: Manya Silvas, MD;  Location: [facility] ENDOSCOPY;  Service:               Endoscopy;  Laterality: N/A; 11/10: DOBUTAMINE STRESS ECHO     Comment:  normal No date: DOPPLER ECHOCARDIOGRAPHY     Comment:  EF 55%, mild MR, TR 07/2006: ESOPHAGOGASTRODUODENOSCOPY     Comment:  multiple angioectasias 03/12/2015: ESOPHAGOGASTRODUODENOSCOPY; Left     Comment:  Procedure: place tube in throat and evaluate stomach and              duodenum for source of bleeding. Cauterize if concern for              rebleeding.;  Surgeon: Hulen Luster, MD;  Location: [facility]  ENDOSCOPY;  Service: Endoscopy;  Laterality: Left; 12/14/2016: ESOPHAGOGASTRODUODENOSCOPY; N/A     Comment:  Procedure: ESOPHAGOGASTRODUODENOSCOPY (EGD);  Surgeon:               Manya Silvas, MD;  Location: [facility] ENDOSCOPY;                Service: Endoscopy;  Laterality: N/A; 11/23/2015: ESOPHAGOGASTRODUODENOSCOPY (EGD) WITH PROPOFOL; N/A     Comment:  Procedure: ESOPHAGOGASTRODUODENOSCOPY (EGD) WITH               PROPOFOL;  Surgeon: Hulen Luster, MD;  Location: [facility]               ENDOSCOPY;  Service: Gastroenterology;  Laterality: N/A; 07/07/2016: ESOPHAGOGASTRODUODENOSCOPY (EGD) WITH PROPOFOL; N/A     Comment:  Procedure: ESOPHAGOGASTRODUODENOSCOPY (EGD) WITH               PROPOFOL;  Surgeon: Manya Silvas, MD;  Location: [facility]              ENDOSCOPY;  Service: Endoscopy;  Laterality: N/A; 10/2008: laryngeal polyp     Comment:  Dr. Tami Ribas No date: TOP BMI    Body Mass Index:  19.74 kg/m      Reproductive/Obstetrics negative OB ROS                             Anesthesia Physical Anesthesia Plan  ASA: III  Anesthesia Plan: General   Post-op Pain Management:    Induction:   PONV Risk Score and Plan:   Airway Management Planned:   Additional Equipment:   Intra-op Plan:   Post-operative Plan:   Informed Consent: I have reviewed the patients History and Physical, chart, labs and discussed the procedure including the risks, benefits and alternatives for the proposed anesthesia with the patient or authorized representative who has indicated his/her understanding and acceptance.   Dental Advisory Given  Plan Discussed with: CRNA  Anesthesia Plan Comments:         Anesthesia Quick Evaluation

## 2017-05-22 ENCOUNTER — Encounter: Payer: Self-pay | Admitting: Unknown Physician Specialty

## 2017-05-23 ENCOUNTER — Other Ambulatory Visit: Payer: Medicare Other

## 2017-05-23 NOTE — Anesthesia Postprocedure Evaluation (Signed)
Anesthesia Post Note  Patient: Kathryn Ware  Procedure(s) Performed: Procedure(s) (LRB): ESOPHAGOGASTRODUODENOSCOPY (EGD) WITH PROPOFOL (N/A)  Patient location during evaluation: PACU Anesthesia Type: General Level of consciousness: awake and alert Pain management: pain level controlled Vital Signs Assessment: post-procedure vital signs reviewed and stable Respiratory status: spontaneous breathing, nonlabored ventilation, respiratory function stable and patient connected to nasal cannula oxygen Cardiovascular status: blood pressure returned to baseline and stable Postop Assessment: no signs of nausea or vomiting Anesthetic complications: no     Last Vitals:  Vitals:   05/19/17 0930 05/19/17 0940  BP: (!) 112/53 (!) 124/54  Pulse: 64 76  Resp: 19 18  Temp:      Last Pain:  Vitals:   05/22/17 0814  TempSrc:   PainSc: 0-No pain                 Molli Barrows

## 2017-05-24 ENCOUNTER — Encounter: Payer: Self-pay | Admitting: *Deleted

## 2017-05-24 NOTE — Progress Notes (Deleted)
Cardiology Office Note Date:  05/24/2017  Patient ID:  Kathryn Ware, Kathryn Ware 1941-09-18, MRN [id number] PCP:  Venia Carbon, MD  Cardiologist:  Dr. Fletcher Anon, MD  ***refresh   Chief Complaint: Hospital follow up  History of Present Illness: Kathryn Ware is a 76 y.o. female with history of GI AV malformation with history of blood loss anemia requiring blood transfusions in the past, DM2, HTN, HLD, and ongoing tobacco abuse who presents for hospital follow up after recent admission from 7/18-7/20 for symptomatic anemia requiring 2 units of pRBC.   Cardiology was asked to evaluate the patient for chest pain in the setting of acute anemia with a HGB of 7.1. No previously known cardiac history. Prior nuclear stress test in 2015 low risk as below. Prior echo in 02/2015 showed normal EF, normal wall motion, GR1DD, PASP normal. Admitted to [facility] 7/18 qith melena with associated dizziness and fleeting intermittent chest pain lasting 10-20 seconds with self resolution. Noted to have a HGB of 7.2 requiring 2 units of pRBC (baseline HGB 9.0-9.5) with a discharge HGB of 10.2. Cardiac enzymes negative. EKG not acute. Echo showed EF 60-65%, normal wall motion, GR1DD, mild MR, left atrium normal in size, normal RV systolic function, PASP normal. She underwent outpatient EGD on 7/27 that showed no acute pathology. Prior colonoscopy in 11/2016 with 2 polyps excised, with pathology being negative for malignancy.  ***   Past Medical History:  Diagnosis Date  . Anemia   . ARF (acute renal failure) ([place])   . Blood transfusion without reported diagnosis   . Diabetes mellitus type II   . GI (gastrointestinal bleed)   . GI AVM (gastrointestinal arteriovenous vascular malformation)    stomach  . History of echocardiogram 12/2013   a. 12/2013: EF 60-65%, DD, mild LVH, nl RV size & systolic function, mild MR/TR, mild AS, nl RVSP; b. TTE 04/2017: EF 60-65%, normal wall motion, GR1DD, mild MR, left atrium normal in size,  normal RV systolic function, PASP normal   . History of nuclear stress test    a. 12/2013: low risk, no sig WMA, nondiag EKG 2/2 baseline LVH w/ repol abnl, no sig ischemia, EF 70% no artifact  . HLD (hyperlipidemia)   . HTN (hypertension)   . Hypercholesterolemia   . IDA (iron deficiency anemia)   . Polyp of colon, adenomatous   . RLS (restless legs syndrome)     Past Surgical History:  Procedure Laterality Date  . ABDOMINAL ADHESION SURGERY  11/2001   Dr. Tamala Julian  . ABDOMINAL HYSTERECTOMY  1976   RSO (fibroid)  . COLONOSCOPY WITH PROPOFOL N/A 07/07/2016   Procedure: COLONOSCOPY WITH PROPOFOL;  Surgeon: Manya Silvas, MD;  Location: [facility] ENDOSCOPY;  Service: Endoscopy;  Laterality: N/A;  . COLONOSCOPY WITH PROPOFOL N/A 12/14/2016   Procedure: COLONOSCOPY WITH PROPOFOL;  Surgeon: Manya Silvas, MD;  Location[facility] ENDOSCOPY;  Service: Endoscopy;  Laterality: N/A;  . DOBUTAMINE STRESS ECHO  11/10   normal  . DOPPLER ECHOCARDIOGRAPHY     EF 55%, mild MR, TR  . ESOPHAGOGASTRODUODENOSCOPY  07/2006   multiple angioectasias  . ESOPHAGOGASTRODUODENOSCOPY Left 03/12/2015   Procedure: place tube in throat and evaluate stomach and duodenum for source of bleeding. Cauterize if concern for rebleeding.;  Surgeon: Hulen Luster, MD;  Location: [facility] ENDOSCOPY;  Service: Endoscopy;  Laterality: Left;  . ESOPHAGOGASTRODUODENOSCOPY N/A 12/14/2016   Procedure: ESOPHAGOGASTRODUODENOSCOPY (EGD);  Surgeon: Manya Silvas, MD;  Location: [facility] ENDOSCOPY;  Service: Endoscopy;  Laterality: N/A;  . ESOPHAGOGASTRODUODENOSCOPY (EGD) WITH PROPOFOL N/A 11/23/2015   Procedure: ESOPHAGOGASTRODUODENOSCOPY (EGD) WITH PROPOFOL;  Surgeon: Hulen Luster, MD;  Location: [facility] ENDOSCOPY;  Service: Gastroenterology;  Laterality: N/A;  . ESOPHAGOGASTRODUODENOSCOPY (EGD) WITH PROPOFOL N/A 07/07/2016   Procedure: ESOPHAGOGASTRODUODENOSCOPY (EGD) WITH PROPOFOL;  Surgeon: Manya Silvas, MD;  Location: [facility] ENDOSCOPY;  Service:  Endoscopy;  Laterality: N/A;  . ESOPHAGOGASTRODUODENOSCOPY (EGD) WITH PROPOFOL N/A 05/19/2017   Procedure: ESOPHAGOGASTRODUODENOSCOPY (EGD) WITH PROPOFOL;  Surgeon: Manya Silvas, MD;  Location: [facility] ENDOSCOPY;  Service: Endoscopy;  Laterality: N/A;  . laryngeal polyp  10/2008   Dr. Tami Ribas  . TOP      No outpatient prescriptions have been marked as taking for the 05/25/17 encounter (Appointment) with Rise Mu, PA-C.    Allergies:   Metformin and Nifedipine   Social History:  The patient  reports that she has been smoking Cigarettes.  She has been smoking about 1.00 pack per day. She has never used smokeless tobacco. She reports that she does not drink alcohol or use drugs.   Family History:  The patient's family history includes Cancer in her sister; Diabetes in her unknown relative; Hypertension in her mother and unknown relative; Stroke in her father.  ROS:   ROS   PHYSICAL EXAM: *** VS:  There were no vitals taken for this visit. BMI: There is no height or weight on file to calculate BMI.  Physical Exam  EKG:  Was ordered and interpreted by me today. Shows ***  Recent Labs: 03/30/2017: ALT 8 05/11/2017: BUN 34; Creatinine, Ser 1.00; Hemoglobin 10.2; Platelets 209; Potassium 4.1; Sodium 141  05/10/2017: Cholesterol 138; HDL 40; LDL Cholesterol 72; Total CHOL/HDL Ratio 3.5; Triglycerides 132; VLDL 26   Estimated Creatinine Clearance: 40.1 mL/min (by C-G formula based on SCr of 1 mg/dL).   Wt Readings from Last 3 Encounters:  05/19/17 115 lb (52.2 kg)  05/10/17 117 lb 3.2 oz (53.2 kg)  04/24/17 113 lb (51.3 kg)     Other studies reviewed: Additional studies/records reviewed today include: summarized above  ASSESSMENT AND PLAN:  1. ***  Disposition: F/u with *** in   Current medicines are reviewed at length with the patient today.  The patient did not have any concerns regarding medicines.  Melvern Banker PA-C 05/24/2017 9:30 AM     [address] [place], [place] [address] [phone]

## 2017-05-25 ENCOUNTER — Telehealth: Payer: Self-pay | Admitting: *Deleted

## 2017-05-25 ENCOUNTER — Other Ambulatory Visit: Payer: Self-pay | Admitting: Internal Medicine

## 2017-05-25 ENCOUNTER — Ambulatory Visit: Payer: Medicare Other | Admitting: Physician Assistant

## 2017-05-25 NOTE — Telephone Encounter (Signed)
Multiple attempts made to contact pt regarding hospital f/u. Pt not TCM eligible, but per Dr Silvio Pate, is needing to be seen. Unable to reach pt, and per chart, she has cancelled radiology appt. Pt is scheduled to be seen at cardiology today; Dr Silvio Pate advised and attempt to contact letter mailed

## 2017-05-31 ENCOUNTER — Encounter: Payer: Self-pay | Admitting: Internal Medicine

## 2017-05-31 ENCOUNTER — Ambulatory Visit (INDEPENDENT_AMBULATORY_CARE_PROVIDER_SITE_OTHER): Payer: Medicare Other | Admitting: Internal Medicine

## 2017-05-31 VITALS — BP 114/60 | HR 88 | Temp 97.7°F | Wt 113.0 lb

## 2017-05-31 DIAGNOSIS — K5520 Angiodysplasia of colon without hemorrhage: Secondary | ICD-10-CM

## 2017-05-31 DIAGNOSIS — I5189 Other ill-defined heart diseases: Secondary | ICD-10-CM

## 2017-05-31 DIAGNOSIS — D500 Iron deficiency anemia secondary to blood loss (chronic): Secondary | ICD-10-CM | POA: Diagnosis not present

## 2017-05-31 DIAGNOSIS — I5032 Chronic diastolic (congestive) heart failure: Secondary | ICD-10-CM | POA: Insufficient documentation

## 2017-05-31 DIAGNOSIS — Z23 Encounter for immunization: Secondary | ICD-10-CM

## 2017-05-31 DIAGNOSIS — I519 Heart disease, unspecified: Secondary | ICD-10-CM

## 2017-05-31 LAB — CBC WITH DIFFERENTIAL/PLATELET
BASOS ABS: 0 10*3/uL (ref 0.0–0.1)
Basophils Relative: 0.4 % (ref 0.0–3.0)
Eosinophils Absolute: 0 10*3/uL (ref 0.0–0.7)
Eosinophils Relative: 0.3 % (ref 0.0–5.0)
HCT: 29.3 % — ABNORMAL LOW (ref 36.0–46.0)
Hemoglobin: 9.3 g/dL — ABNORMAL LOW (ref 12.0–15.0)
LYMPHS ABS: 1 10*3/uL (ref 0.7–4.0)
Lymphocytes Relative: 13.3 % (ref 12.0–46.0)
MCHC: 31.8 g/dL (ref 30.0–36.0)
MCV: 86.6 fl (ref 78.0–100.0)
MONO ABS: 0.8 10*3/uL (ref 0.1–1.0)
Monocytes Relative: 10.9 % (ref 3.0–12.0)
NEUTROS PCT: 75.1 % (ref 43.0–77.0)
Neutro Abs: 5.4 10*3/uL (ref 1.4–7.7)
PLATELETS: 385 10*3/uL (ref 150.0–400.0)
RBC: 3.39 Mil/uL — AB (ref 3.87–5.11)
RDW: 16.3 % — ABNORMAL HIGH (ref 11.5–15.5)
WBC: 7.2 10*3/uL (ref 4.0–10.5)

## 2017-05-31 NOTE — Assessment & Plan Note (Signed)
Will recheck labs Hgb 8.4 after transfusion

## 2017-05-31 NOTE — Progress Notes (Signed)
Subjective:    Patient ID: Kathryn Ware, female    DOB: May 03, 1941, 76 y.o.   MRN: [id number]  HPI Here for hospital follow up Reviewed hospital course Had EGD since then--by Dr Vira Agar. Just showed mild portal gastropathy No bleeding  No apparent bleeding since hospitalization Back to work part time  Slight dizziness only No syncope No chest pain No SOB Reviewed echo-- good LV ef with some diastolic dysfunction No symptoms of CHF  Current Outpatient Prescriptions on File Prior to Visit  Medication Sig Dispense Refill  . clonazePAM (KLONOPIN) 0.5 MG tablet Take 1 tablet (0.5 mg total) by mouth daily as needed. 30 tablet 0  . ferrous sulfate 325 (65 FE) MG tablet Take 325 mg by mouth 2 (two) times daily.     Marland Kitchen lisinopril-hydrochlorothiazide (PRINZIDE,ZESTORETIC) 20-12.5 MG tablet Take 1 tablet by mouth daily. (Patient taking differently: Take 2 tablets by mouth daily. ) 1 tablet 0  . metFORMIN (GLUCOPHAGE-XR) 500 MG 24 hr tablet Take 500 mg by mouth daily with breakfast.    . omeprazole (PRILOSEC) 40 MG capsule TAKE 1 CAPSULE(40 MG) BY MOUTH TWICE DAILY BEFORE A MEAL 180 capsule 3  . ONE TOUCH ULTRA TEST test strip USE AS DIRECTED ONCE DAILY 100 each 3  . simvastatin (ZOCOR) 80 MG tablet TAKE 1 TABLET(80 MG) BY MOUTH DAILY 90 tablet 3  . sucralfate (CARAFATE) 1 g tablet TAKE 1 TABLET BY MOUTH EVERY NIGHT BEFORE MEALS AND AT BEDTIME 120 tablet 0  . vitamin B-12 (CYANOCOBALAMIN) 1000 MCG tablet Take 1,000 mcg by mouth daily.     No current facility-administered medications on file prior to visit.     Allergies  Allergen Reactions  . Metformin     REACTION: diarrhea when dosage is increased, patient aware that she is onmetformin when it is listed that she is allergic  . Nifedipine     REACTION: cough    Past Medical History:  Diagnosis Date  . Anemia   . ARF (acute renal failure) ([employer])   . Blood transfusion without reported diagnosis   . Diabetes mellitus type II   .  GI (gastrointestinal bleed)   . GI AVM (gastrointestinal arteriovenous vascular malformation)    stomach  . History of echocardiogram 12/2013   a. 12/2013: EF 60-65%, DD, mild LVH, nl RV size & systolic function, mild MR/TR, mild AS, nl RVSP; b. TTE 04/2017: EF 60-65%, normal wall motion, GR1DD, mild MR, left atrium normal in size, normal RV systolic function, PASP normal   . History of nuclear stress test    a. 12/2013: low risk, no sig WMA, nondiag EKG 2/2 baseline LVH w/ repol abnl, no sig ischemia, EF 70% no artifact  . HLD (hyperlipidemia)   . HTN (hypertension)   . Hypercholesterolemia   . IDA (iron deficiency anemia)   . Polyp of colon, adenomatous   . RLS (restless legs syndrome)     Past Surgical History:  Procedure Laterality Date  . ABDOMINAL ADHESION SURGERY  11/2001   Dr. Tamala Julian  . ABDOMINAL HYSTERECTOMY  1976   RSO (fibroid)  . COLONOSCOPY WITH PROPOFOL N/A 07/07/2016   Procedure: COLONOSCOPY WITH PROPOFOL;  Surgeon: Manya Silvas, MD;  Location: [facility] ENDOSCOPY;  Service: Endoscopy;  Laterality: N/A;  . COLONOSCOPY WITH PROPOFOL N/A 12/14/2016   Procedure: COLONOSCOPY WITH PROPOFOL;  Surgeon: Manya Silvas, MD;  Location: [facility] ENDOSCOPY;  Service: Endoscopy;  Laterality: N/A;  . DOBUTAMINE STRESS ECHO  11/10   normal  .  DOPPLER ECHOCARDIOGRAPHY     EF 55%, mild MR, TR  . ESOPHAGOGASTRODUODENOSCOPY  07/2006   multiple angioectasias  . ESOPHAGOGASTRODUODENOSCOPY Left 03/12/2015   Procedure: place tube in throat and evaluate stomach and duodenum for source of bleeding. Cauterize if concern for rebleeding.;  Surgeon: Hulen Luster, MD;  Location[facility] ENDOSCOPY;  Service: Endoscopy;  Laterality: Left;  . ESOPHAGOGASTRODUODENOSCOPY N/A 12/14/2016   Procedure: ESOPHAGOGASTRODUODENOSCOPY (EGD);  Surgeon: Manya Silvas, MD;  Location: [facility] ENDOSCOPY;  Service: Endoscopy;  Laterality: N/A;  . ESOPHAGOGASTRODUODENOSCOPY (EGD) WITH PROPOFOL N/A 11/23/2015   Procedure:  ESOPHAGOGASTRODUODENOSCOPY (EGD) WITH PROPOFOL;  Surgeon: Hulen Luster, MD;  Location: [facility] ENDOSCOPY;  Service: Gastroenterology;  Laterality: N/A;  . ESOPHAGOGASTRODUODENOSCOPY (EGD) WITH PROPOFOL N/A 07/07/2016   Procedure: ESOPHAGOGASTRODUODENOSCOPY (EGD) WITH PROPOFOL;  Surgeon: Manya Silvas, MD;  Location: [facility] ENDOSCOPY;  Service: Endoscopy;  Laterality: N/A;  . ESOPHAGOGASTRODUODENOSCOPY (EGD) WITH PROPOFOL N/A 05/19/2017   Procedure: ESOPHAGOGASTRODUODENOSCOPY (EGD) WITH PROPOFOL;  Surgeon: Manya Silvas, MD;  Location: [facility] ENDOSCOPY;  Service: Endoscopy;  Laterality: N/A;  . laryngeal polyp  10/2008   Dr. Tami Ribas  . TOP      Family History  Problem Relation Age of Onset  . Stroke Father   . Cancer Sister        breast cancer  . Hypertension Mother   . Hypertension Unknown        sibling  . Diabetes Unknown        grandmother    Social History   Social History  . Marital status: Widowed    Spouse name: N/A  . Number of children: 1  . Years of education: N/A   Occupational History  . Seamstress         Social History Main Topics  . Smoking status: Current Every Day Smoker    Packs/day: 1.00    Types: Cigarettes  . Smokeless tobacco: Never Used  . Alcohol use No  . Drug use: No  . Sexual activity: Not on file   Other Topics Concern  . Not on file   Social History Narrative   No living will   Requests son Orson Gear as health care POA   Would accept resuscitation --but no prolonged machines   Not sure about feeding tubes   Review of Systems Appetite is good No time to cook what she likes though    Objective:   Physical Exam  Constitutional: No distress.  Neck: No thyromegaly present.  Cardiovascular: Normal rate and regular rhythm.  Exam reveals no gallop.   Soft aortic systolic murmur  Pulmonary/Chest: Effort normal and breath sounds normal. No respiratory distress. She has no wheezes. She has no rales.  Abdominal: Soft. She exhibits no  distension and no mass. There is no tenderness. There is no rebound and no guarding.  Musculoskeletal: She exhibits no edema.  Lymphadenopathy:    She has no cervical adenopathy.  Psychiatric: She has a normal mood and affect. Her behavior is normal.          Assessment & Plan:

## 2017-05-31 NOTE — Addendum Note (Signed)
Addended by: Pilar Grammes on: 05/31/2017 12:39 PM   Modules accepted: Orders

## 2017-05-31 NOTE — Assessment & Plan Note (Signed)
Recurrent bleeding She can usually tell---not bleeding now

## 2017-05-31 NOTE — Assessment & Plan Note (Signed)
Hospital records and echo reviewed No CHF

## 2017-06-02 ENCOUNTER — Encounter: Payer: Self-pay | Admitting: *Deleted

## 2017-06-05 ENCOUNTER — Encounter: Payer: Self-pay | Admitting: Family Medicine

## 2017-06-05 ENCOUNTER — Ambulatory Visit (INDEPENDENT_AMBULATORY_CARE_PROVIDER_SITE_OTHER): Payer: Medicare Other | Admitting: Family Medicine

## 2017-06-05 VITALS — BP 108/64 | HR 88 | Temp 97.7°F | Resp 18 | Ht 64.0 in | Wt 112.0 lb

## 2017-06-05 DIAGNOSIS — R29898 Other symptoms and signs involving the musculoskeletal system: Secondary | ICD-10-CM

## 2017-06-05 DIAGNOSIS — M25551 Pain in right hip: Secondary | ICD-10-CM

## 2017-06-05 NOTE — Progress Notes (Signed)
Dr. Frederico Hamman T. Caitlain Tweed, MD, [facility] [place], [address] Phone: [phone] Fax: [phone]  06/05/2017  Patient: Kathryn Ware, MRN: [id number], DOB: Apr 19, 1941, 76 y.o.  Primary Physician:  Venia Carbon, MD   Chief Complaint  Patient presents with  . Follow-up    6 week   Subjective:   Kathryn Ware is a 76 y.o. very pleasantt female patient who presents with the following:  F/u R hip: Her hip pain and weakness is completely resolved. She is now not having any symptoms at all.  Better  str improved  Past Medical History, Surgical History, Social History, Family History, Problem List, Medications, and Allergies have been reviewed and updated if relevant.  Patient Active Problem List   Diagnosis Date Noted  . Diastolic dysfunction 56/38/9373  . Abnormal EKG 05/11/2017  . Symptomatic anemia 05/10/2017  . Malnutrition of mild degree ([place]) 03/30/2017  . Anemia 07/05/2016  . Restless legs syndrome (RLS) 02/23/2016  . 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 Anemia due to gastrointestinal blood loss 02/18/2015  . Advance directive discussed with patient 02/18/2015  . Atypical chest pain 01/14/2014  . Nicotine dependence 10/29/2013  . Routine general medical examination at a health care facility 08/17/2011  . Diabetes mellitus with complication ([place]) 42/87/6811  . HYPERCHOLESTEROLEMIA 02/02/2007  . Essential hypertension 02/02/2007  . Angiodysplasia of intestinal tract 02/02/2007    Past Medical History:  Diagnosis Date  . Anemia   . ARF (acute renal failure) ([place])   . Blood transfusion without reported diagnosis   . Diabetes mellitus type II   . GI (gastrointestinal bleed)   . GI AVM (gastrointestinal arteriovenous vascular malformation)    stomach  . History of echocardiogram 12/2013   a. 12/2013: EF 60-65%, DD, mild LVH, nl RV size & systolic function, mild MR/TR, mild AS, nl RVSP; b.  TTE 04/2017: EF 60-65%, normal wall motion, GR1DD, mild MR, left atrium normal in size, normal RV systolic function, PASP normal   . History of nuclear stress test    a. 12/2013: low risk, no sig WMA, nondiag EKG 2/2 baseline LVH w/ repol abnl, no sig ischemia, EF 70% no artifact  . HLD (hyperlipidemia)   . HTN (hypertension)   . Hypercholesterolemia   . IDA (iron deficiency anemia)   . Polyp of colon, adenomatous   . RLS (restless legs syndrome)     Past Surgical History:  Procedure Laterality Date  . ABDOMINAL ADHESION SURGERY  11/2001   Dr. Tamala Julian  . ABDOMINAL HYSTERECTOMY  1976   RSO (fibroid)  . COLONOSCOPY WITH PROPOFOL N/A 07/07/2016   Procedure: COLONOSCOPY WITH PROPOFOL;  Surgeon: Manya Silvas, MD;  Location: [facility] ENDOSCOPY;  Service: Endoscopy;  Laterality: N/A;  . COLONOSCOPY WITH PROPOFOL N/A 12/14/2016   Procedure: COLONOSCOPY WITH PROPOFOL;  Surgeon: Manya Silvas, MD;  Location: [facility] ENDOSCOPY;  Service: Endoscopy;  Laterality: N/A;  . DOBUTAMINE STRESS ECHO  11/10   normal  . DOPPLER ECHOCARDIOGRAPHY     EF 55%, mild MR, TR  . ESOPHAGOGASTRODUODENOSCOPY  07/2006   multiple angioectasias  . ESOPHAGOGASTRODUODENOSCOPY Left 03/12/2015   Procedure: place tube in throat and evaluate stomach and duodenum for source of bleeding. Cauterize if concern for rebleeding.;  Surgeon: Hulen Luster, MD;  Location: [facility] ENDOSCOPY;  Service: Endoscopy;  Laterality: Left;  . ESOPHAGOGASTRODUODENOSCOPY N/A 12/14/2016   Procedure: ESOPHAGOGASTRODUODENOSCOPY (EGD);  Surgeon: Manya Silvas, MD;  Location: [facility]  ENDOSCOPY;  Service: Endoscopy;  Laterality: N/A;  . ESOPHAGOGASTRODUODENOSCOPY (EGD) WITH PROPOFOL N/A 11/23/2015   Procedure: ESOPHAGOGASTRODUODENOSCOPY (EGD) WITH PROPOFOL;  Surgeon: Hulen Luster, MD;  Location: [facility] ENDOSCOPY;  Service: Gastroenterology;  Laterality: N/A;  . ESOPHAGOGASTRODUODENOSCOPY (EGD) WITH PROPOFOL N/A 07/07/2016   Procedure: ESOPHAGOGASTRODUODENOSCOPY (EGD) WITH  PROPOFOL;  Surgeon: Manya Silvas, MD;  Location[facility] ENDOSCOPY;  Service: Endoscopy;  Laterality: N/A;  . ESOPHAGOGASTRODUODENOSCOPY (EGD) WITH PROPOFOL N/A 05/19/2017   Procedure: ESOPHAGOGASTRODUODENOSCOPY (EGD) WITH PROPOFOL;  Surgeon: Manya Silvas, MD;  Location: [facility] ENDOSCOPY;  Service: Endoscopy;  Laterality: N/A;  . laryngeal polyp  10/2008   Dr. Tami Ribas  . TOP      Social History   Social History  . Marital status: Widowed    Spouse name: N/A  . Number of children: 1  . Years of education: N/A   Occupational History  . Seamstress         Social History Main Topics  . Smoking status: Current Every Day Smoker    Packs/day: 1.00    Types: Cigarettes  . Smokeless tobacco: Never Used  . Alcohol use No  . Drug use: No  . Sexual activity: Not on file   Other Topics Concern  . Not on file   Social History Narrative   No living will   Requests son Orson Gear as health care POA   Would accept resuscitation --but no prolonged machines   Not sure about feeding tubes    Family History  Problem Relation Age of Onset  . Stroke Father   . Cancer Sister        breast cancer  . Hypertension Mother   . Hypertension Unknown        sibling  . Diabetes Unknown        grandmother    Allergies  Allergen Reactions  . Metformin     REACTION: diarrhea when dosage is increased, patient aware that she is onmetformin when it is listed that she is allergic  . Nifedipine     REACTION: cough    Medication list reviewed and updated in full in [place].  GEN: No fevers, chills. Nontoxic. Primarily MSK c/o today. MSK: Detailed in the HPI GI: tolerating PO intake without difficulty Neuro: No numbness, parasthesias, or tingling associated. Otherwise the pertinent positives of the ROS are noted above.   Objective:   BP 108/64   Pulse 88   Temp 97.7 F (36.5 C)   Resp 18   Ht 5\' 4"  (1.626 m)   Wt 112 lb (50.8 kg)   SpO2 97%   BMI 19.22 kg/m     GEN: WDWN, NAD, Non-toxic, Alert & Oriented x 3 HEENT: Atraumatic, Normocephalic.  Ears and Nose: No external deformity. EXTR: No clubbing/cyanosis/edema NEURO: Normal gait.  PSYCH: Normally interactive. Conversant. Not depressed or anxious appearing.  Calm demeanor.   HIP EXAM: SIDE: R ROM: Abduction, Flexion, Internal and External range of motion: full Pain with terminal IROM and EROM: none GTB: NT SLR: NEG Knees: No effusion FABER: NT REVERSE FABER: NT, neg Piriformis: NT at direct palpation Str: flexion: 5/5 abduction: 5/5 adduction: 5/5 Strength testing non-tender     Radiology:  Assessment and Plan:   Right hip pain  Right leg weakness  resolved  Follow-up: No Follow-up on file.  Future Appointments Date Time Provider [place]  06/20/2017 10:30 AM Rogelia Mire, NP [facility]  06/23/2017 10:20 AM [facility]  06/23/2017  10:40 AM [facility] [place] [facility]  10/02/2017 11:00 AM Venia Carbon, MD [facility]   Signed,  Maud Deed. Amyrah Pinkhasov, MD   Allergies as of 06/05/2017      Reactions   Metformin    REACTION: diarrhea when dosage is increased, patient aware that she is onmetformin when it is listed that she is allergic   Nifedipine    REACTION: cough      Medication List       Accurate as of 06/05/17 12:57 PM. Always use your most recent med list.          clonazePAM 0.5 MG tablet Commonly known as:  KLONOPIN Take 1 tablet (0.5 mg total) by mouth daily as needed.   ferrous sulfate 325 (65 FE) MG tablet Take 325 mg by mouth 2 (two) times daily.   lisinopril-hydrochlorothiazide 20-12.5 MG tablet Commonly known as:  PRINZIDE,ZESTORETIC Take 1 tablet by mouth daily.   metFORMIN 500 MG 24 hr tablet Commonly known as:  GLUCOPHAGE-XR Take 500 mg by mouth daily with breakfast.   omeprazole 40 MG capsule Commonly known as:  PRILOSEC TAKE 1 CAPSULE(40 MG) BY MOUTH TWICE DAILY BEFORE A  MEAL   ONE TOUCH ULTRA TEST test strip Generic drug:  glucose blood USE AS DIRECTED ONCE DAILY   simvastatin 80 MG tablet Commonly known as:  ZOCOR TAKE 1 TABLET(80 MG) BY MOUTH DAILY   sucralfate 1 g tablet Commonly known as:  CARAFATE TAKE 1 TABLET BY MOUTH EVERY NIGHT BEFORE MEALS AND AT BEDTIME   vitamin B-12 1000 MCG tablet Commonly known as:  CYANOCOBALAMIN Take 1,000 mcg by mouth daily.

## 2017-06-20 ENCOUNTER — Ambulatory Visit: Payer: Medicare Other | Admitting: Nurse Practitioner

## 2017-06-23 ENCOUNTER — Ambulatory Visit
Admission: RE | Admit: 2017-06-23 | Discharge: 2017-06-23 | Disposition: A | Discharge status: Home / Self Care | Payer: Medicare Other | Source: Ambulatory Visit | Attending: Internal Medicine | Admitting: Internal Medicine

## 2017-06-23 DIAGNOSIS — R928 Other abnormal and inconclusive findings on diagnostic imaging of breast: Secondary | ICD-10-CM | POA: Insufficient documentation

## 2017-06-23 DIAGNOSIS — R921 Mammographic calcification found on diagnostic imaging of breast: Secondary | ICD-10-CM | POA: Insufficient documentation

## 2017-07-03 ENCOUNTER — Other Ambulatory Visit: Payer: Self-pay | Admitting: Internal Medicine

## 2017-07-14 ENCOUNTER — Observation Stay
Admission: EM | Admit: 2017-07-14 | Discharge: 2017-07-15 | Disposition: A | Discharge status: Home / Self Care | Payer: Medicare Other | Attending: Internal Medicine | Admitting: Internal Medicine

## 2017-07-14 ENCOUNTER — Encounter: Payer: Self-pay | Admitting: *Deleted

## 2017-07-14 ENCOUNTER — Emergency Department: Payer: Medicare Other

## 2017-07-14 DIAGNOSIS — R778 Other specified abnormalities of plasma proteins: Secondary | ICD-10-CM

## 2017-07-14 DIAGNOSIS — D500 Iron deficiency anemia secondary to blood loss (chronic): Secondary | ICD-10-CM | POA: Insufficient documentation

## 2017-07-14 DIAGNOSIS — Z79899 Other long term (current) drug therapy: Secondary | ICD-10-CM | POA: Diagnosis not present

## 2017-07-14 DIAGNOSIS — G2581 Restless legs syndrome: Secondary | ICD-10-CM | POA: Diagnosis not present

## 2017-07-14 DIAGNOSIS — I110 Hypertensive heart disease with heart failure: Secondary | ICD-10-CM | POA: Diagnosis not present

## 2017-07-14 DIAGNOSIS — E119 Type 2 diabetes mellitus without complications: Secondary | ICD-10-CM | POA: Insufficient documentation

## 2017-07-14 DIAGNOSIS — R7989 Other specified abnormal findings of blood chemistry: Secondary | ICD-10-CM | POA: Diagnosis not present

## 2017-07-14 DIAGNOSIS — Z794 Long term (current) use of insulin: Secondary | ICD-10-CM | POA: Insufficient documentation

## 2017-07-14 DIAGNOSIS — E785 Hyperlipidemia, unspecified: Secondary | ICD-10-CM | POA: Diagnosis not present

## 2017-07-14 DIAGNOSIS — I5032 Chronic diastolic (congestive) heart failure: Secondary | ICD-10-CM | POA: Diagnosis present

## 2017-07-14 DIAGNOSIS — E7800 Pure hypercholesterolemia, unspecified: Secondary | ICD-10-CM | POA: Diagnosis not present

## 2017-07-14 DIAGNOSIS — I10 Essential (primary) hypertension: Secondary | ICD-10-CM | POA: Diagnosis not present

## 2017-07-14 DIAGNOSIS — R079 Chest pain, unspecified: Secondary | ICD-10-CM | POA: Diagnosis not present

## 2017-07-14 DIAGNOSIS — F17210 Nicotine dependence, cigarettes, uncomplicated: Secondary | ICD-10-CM | POA: Diagnosis not present

## 2017-07-14 DIAGNOSIS — R0602 Shortness of breath: Secondary | ICD-10-CM | POA: Diagnosis not present

## 2017-07-14 DIAGNOSIS — J984 Other disorders of lung: Secondary | ICD-10-CM | POA: Diagnosis not present

## 2017-07-14 DIAGNOSIS — R0600 Dyspnea, unspecified: Secondary | ICD-10-CM

## 2017-07-14 DIAGNOSIS — R531 Weakness: Secondary | ICD-10-CM | POA: Diagnosis not present

## 2017-07-14 DIAGNOSIS — R0609 Other forms of dyspnea: Secondary | ICD-10-CM

## 2017-07-14 DIAGNOSIS — E118 Type 2 diabetes mellitus with unspecified complications: Secondary | ICD-10-CM | POA: Diagnosis present

## 2017-07-14 DIAGNOSIS — R42 Dizziness and giddiness: Secondary | ICD-10-CM | POA: Diagnosis not present

## 2017-07-14 LAB — BASIC METABOLIC PANEL
Anion gap: 8 (ref 5–15)
BUN: 32 mg/dL — AB (ref 6–20)
CALCIUM: 9.3 mg/dL (ref 8.9–10.3)
CO2: 23 mmol/L (ref 22–32)
CREATININE: 1.19 mg/dL — AB (ref 0.44–1.00)
Chloride: 105 mmol/L (ref 101–111)
GFR calc Af Amer: 50 mL/min — ABNORMAL LOW (ref >60)
GFR, EST NON AFRICAN AMERICAN: 44 mL/min — AB (ref >60)
Glucose, Bld: 247 mg/dL — ABNORMAL HIGH (ref 65–99)
POTASSIUM: 4.3 mmol/L (ref 3.5–5.1)
SODIUM: 136 mmol/L (ref 135–145)

## 2017-07-14 LAB — CBC
HCT: 24.2 % — ABNORMAL LOW (ref 35.0–47.0)
Hemoglobin: 8 g/dL — ABNORMAL LOW (ref 12.0–16.0)
MCH: 28.6 pg (ref 26.0–34.0)
MCHC: 32.9 g/dL (ref 32.0–36.0)
MCV: 86.8 fL (ref 80.0–100.0)
PLATELETS: 206 10*3/uL (ref 150–440)
RBC: 2.79 MIL/uL — ABNORMAL LOW (ref 3.80–5.20)
RDW: 13.7 % (ref 11.5–14.5)
WBC: 4.9 10*3/uL (ref 3.6–11.0)

## 2017-07-14 LAB — TROPONIN I
TROPONIN I: 0.06 ng/mL — AB (ref <0.03)
Troponin I: 0.07 ng/mL (ref <0.03)

## 2017-07-14 LAB — GLUCOSE, CAPILLARY: Glucose-Capillary: 100 mg/dL — ABNORMAL HIGH (ref 65–99)

## 2017-07-14 LAB — BRAIN NATRIURETIC PEPTIDE: B NATRIURETIC PEPTIDE 5: 73 pg/mL (ref 0.0–100.0)

## 2017-07-14 MED ORDER — OXYCODONE HCL 5 MG PO TABS: 5.0000 mg | ORAL_TABLET | ORAL | Status: DC | PRN

## 2017-07-14 MED ORDER — HYDROCHLOROTHIAZIDE 12.5 MG PO CAPS
12.5000 mg | ORAL_CAPSULE | Freq: Every day | ORAL | Status: DC
  Filled 2017-07-14: qty 1

## 2017-07-14 MED ORDER — ONDANSETRON HCL 4 MG/2ML IJ SOLN: 4.0000 mg | Freq: Four times a day (QID) | INTRAMUSCULAR | Status: DC | PRN

## 2017-07-14 MED ORDER — ALBUTEROL SULFATE (2.5 MG/3ML) 0.083% IN NEBU
5.0000 mg | INHALATION_SOLUTION | Freq: Once | RESPIRATORY_TRACT | Status: AC
  Administered 2017-07-14: 5 mg via RESPIRATORY_TRACT
  Filled 2017-07-14: qty 6

## 2017-07-14 MED ORDER — ACETAMINOPHEN 325 MG PO TABS: 650.0000 mg | ORAL_TABLET | Freq: Four times a day (QID) | ORAL | Status: DC | PRN

## 2017-07-14 MED ORDER — ONDANSETRON HCL 4 MG PO TABS: 4.0000 mg | ORAL_TABLET | Freq: Four times a day (QID) | ORAL | Status: DC | PRN

## 2017-07-14 MED ORDER — INSULIN ASPART 100 UNIT/ML ~~LOC~~ SOLN: 0.0000 [IU] | Freq: Three times a day (TID) | SUBCUTANEOUS | Status: DC

## 2017-07-14 MED ORDER — LISINOPRIL 20 MG PO TABS
20.0000 mg | ORAL_TABLET | Freq: Every day | ORAL | Status: DC
  Filled 2017-07-14: qty 1

## 2017-07-14 MED ORDER — ATORVASTATIN CALCIUM 20 MG PO TABS: 40.0000 mg | ORAL_TABLET | Freq: Every day | ORAL | Status: DC

## 2017-07-14 MED ORDER — ENOXAPARIN SODIUM 40 MG/0.4ML ~~LOC~~ SOLN: 40.0000 mg | SUBCUTANEOUS | Status: DC

## 2017-07-14 MED ORDER — PANTOPRAZOLE SODIUM 40 MG PO TBEC
40.0000 mg | DELAYED_RELEASE_TABLET | Freq: Two times a day (BID) | ORAL | Status: DC
  Administered 2017-07-14 – 2017-07-15 (×2): 40 mg via ORAL
  Filled 2017-07-14 (×2): qty 1

## 2017-07-14 MED ORDER — LISINOPRIL-HYDROCHLOROTHIAZIDE 20-12.5 MG PO TABS: 1.0000 | ORAL_TABLET | Freq: Every day | ORAL | Status: DC

## 2017-07-14 MED ORDER — ACETAMINOPHEN 650 MG RE SUPP: 650.0000 mg | Freq: Four times a day (QID) | RECTAL | Status: DC | PRN

## 2017-07-14 MED ORDER — INSULIN ASPART 100 UNIT/ML ~~LOC~~ SOLN: 0.0000 [IU] | Freq: Every day | SUBCUTANEOUS | Status: DC

## 2017-07-14 MED ORDER — CLONAZEPAM 0.5 MG PO TABS: 0.5000 mg | ORAL_TABLET | Freq: Every day | ORAL | Status: DC | PRN

## 2017-07-14 NOTE — ED Triage Notes (Signed)
Pt brought in via ems from home with weak and dizzy for  2 weeks   Pt states feeling worse today.   Pt alert

## 2017-07-14 NOTE — H&P (Signed)
[facility] [place] NAME: Kathryn Ware    MR#:  [id number]  DATE OF BIRTH:  11-01-1940  DATE OF ADMISSION:  07/14/2017  PRIMARY CARE PHYSICIAN: Venia Carbon, MD   REQUESTING/REFERRING PHYSICIAN: Burlene Arnt, MD  CHIEF COMPLAINT:   Chief Complaint  Patient presents with  . Weakness  . Dizziness    HISTORY OF PRESENT ILLNESS:  Kathryn Ware  is a 76 y.o. female who presents with Exertional chest pain and dyspnea on exertion for the past couple of weeks, getting progressively worse. Patient's troponin was mildly elevated in the ED here today. Hospitalists were called for admission and further evaluation  PAST MEDICAL HISTORY:   Past Medical History:  Diagnosis Date  . Anemia   . ARF (acute renal failure) ([place])   . Blood transfusion without reported diagnosis   . Diabetes mellitus type II   . GI (gastrointestinal bleed)   . GI AVM (gastrointestinal arteriovenous vascular malformation)    stomach  . History of echocardiogram 12/2013   a. 12/2013: EF 60-65%, DD, mild LVH, nl RV size & systolic function, mild MR/TR, mild AS, nl RVSP; b. TTE 04/2017: EF 60-65%, normal wall motion, GR1DD, mild MR, left atrium normal in size, normal RV systolic function, PASP normal   . History of nuclear stress test    a. 12/2013: low risk, no sig WMA, nondiag EKG 2/2 baseline LVH w/ repol abnl, no sig ischemia, EF 70% no artifact  . HLD (hyperlipidemia)   . HTN (hypertension)   . Hypercholesterolemia   . IDA (iron deficiency anemia)   . Polyp of colon, adenomatous   . RLS (restless legs syndrome)     PAST SURGICAL HISTORY:   Past Surgical History:  Procedure Laterality Date  . ABDOMINAL ADHESION SURGERY  11/2001   Dr. Tamala Julian  . ABDOMINAL HYSTERECTOMY  1976   RSO (fibroid)  . COLONOSCOPY WITH PROPOFOL N/A 07/07/2016   Procedure: COLONOSCOPY WITH PROPOFOL;  Surgeon: Manya Silvas, MD;  Location: [facility] ENDOSCOPY;  Service: Endoscopy;   Laterality: N/A;  . COLONOSCOPY WITH PROPOFOL N/A 12/14/2016   Procedure: COLONOSCOPY WITH PROPOFOL;  Surgeon: Manya Silvas, MD;  Location: [facility] ENDOSCOPY;  Service: Endoscopy;  Laterality: N/A;  . DOBUTAMINE STRESS ECHO  11/10   normal  . DOPPLER ECHOCARDIOGRAPHY     EF 55%, mild MR, TR  . ESOPHAGOGASTRODUODENOSCOPY  07/2006   multiple angioectasias  . ESOPHAGOGASTRODUODENOSCOPY Left 03/12/2015   Procedure: place tube in throat and evaluate stomach and duodenum for source of bleeding. Cauterize if concern for rebleeding.;  Surgeon: Hulen Luster, MD;  Location: [facility] ENDOSCOPY;  Service: Endoscopy;  Laterality: Left;  . ESOPHAGOGASTRODUODENOSCOPY N/A 12/14/2016   Procedure: ESOPHAGOGASTRODUODENOSCOPY (EGD);  Surgeon: Manya Silvas, MD;  Location: [facility] ENDOSCOPY;  Service: Endoscopy;  Laterality: N/A;  . ESOPHAGOGASTRODUODENOSCOPY (EGD) WITH PROPOFOL N/A 11/23/2015   Procedure: ESOPHAGOGASTRODUODENOSCOPY (EGD) WITH PROPOFOL;  Surgeon: Hulen Luster, MD;  Location: [facility] ENDOSCOPY;  Service: Gastroenterology;  Laterality: N/A;  . ESOPHAGOGASTRODUODENOSCOPY (EGD) WITH PROPOFOL N/A 07/07/2016   Procedure: ESOPHAGOGASTRODUODENOSCOPY (EGD) WITH PROPOFOL;  Surgeon: Manya Silvas, MD;  Location: [facility] ENDOSCOPY;  Service: Endoscopy;  Laterality: N/A;  . ESOPHAGOGASTRODUODENOSCOPY (EGD) WITH PROPOFOL N/A 05/19/2017   Procedure: ESOPHAGOGASTRODUODENOSCOPY (EGD) WITH PROPOFOL;  Surgeon: Manya Silvas, MD;  Location: [facility] ENDOSCOPY;  Service: Endoscopy;  Laterality: N/A;  . laryngeal polyp  10/2008   Dr. Tami Ribas  . TOP      SOCIAL HISTORY:  Social History  Substance Use Topics  . Smoking status: Current Every Day Smoker    Packs/day: 1.00    Types: Cigarettes  . Smokeless tobacco: Never Used  . Alcohol use No    FAMILY HISTORY:   Family History  Problem Relation Age of Onset  . Stroke Father   . Cancer Sister        breast cancer  . Hypertension Mother   . Hypertension Unknown         sibling  . Diabetes Unknown        grandmother    DRUG ALLERGIES:   Allergies  Allergen Reactions  . Metformin     REACTION: diarrhea when dosage is increased, patient aware that she is onmetformin when it is listed that she is allergic  . Nifedipine     REACTION: cough    MEDICATIONS AT HOME:   Prior to Admission medications   Medication Sig Start Date End Date Taking? Authorizing Provider  clonazePAM (KLONOPIN) 0.5 MG tablet Take 1 tablet (0.5 mg total) by mouth daily as needed. Patient taking differently: Take 0.5 mg by mouth daily as needed for anxiety.  08/03/16  Yes Viviana Simpler I, MD  ferrous sulfate 325 (65 FE) MG tablet Take 325 mg by mouth 2 (two) times daily.    Yes [provider]  lisinopril-hydrochlorothiazide (PRINZIDE,ZESTORETIC) 20-12.5 MG tablet Take 1 tablet by mouth daily. 07/20/16  Yes Venia Carbon, MD  metFORMIN (GLUCOPHAGE-XR) 500 MG 24 hr tablet Take 500 mg by mouth every evening.    Yes [provider]  omeprazole (PRILOSEC) 40 MG capsule Take 40 mg by mouth 2 (two) times daily as needed. For heartburn   Yes [provider]  ONE TOUCH ULTRA TEST test strip TEST AS DIRECTED ONCE DAILY 07/04/17  Yes Venia Carbon, MD  simvastatin (ZOCOR) 80 MG tablet Take 80 mg by mouth at bedtime.   Yes [provider]  sucralfate (CARAFATE) 1 g tablet Take 1 g by mouth 4 (four) times daily as needed. For indigestion   Yes [provider]  vitamin B-12 (CYANOCOBALAMIN) 1000 MCG tablet Take 1,000 mcg by mouth daily.   Yes [provider]    REVIEW OF SYSTEMS:  Review of Systems  Constitutional: Negative for chills, fever, malaise/fatigue and weight loss.  HENT: Negative for ear pain, hearing loss and tinnitus.   Eyes: Negative for blurred vision, double vision, pain and redness.  Respiratory: Positive for shortness of breath. Negative for cough and hemoptysis.   Cardiovascular: Positive for chest pain. Negative  for palpitations, orthopnea and leg swelling.  Gastrointestinal: Negative for abdominal pain, constipation, diarrhea, nausea and vomiting.  Genitourinary: Negative for dysuria, frequency and hematuria.  Musculoskeletal: Negative for back pain, joint pain and neck pain.  Skin:       No acne, rash, or lesions  Neurological: Negative for dizziness, tremors, focal weakness and weakness.  Endo/Heme/Allergies: Negative for polydipsia. Does not bruise/bleed easily.  Psychiatric/Behavioral: Negative for depression. The patient is not nervous/anxious and does not have insomnia.      VITAL SIGNS:   Vitals:   07/14/17 1820 07/14/17 1856 07/14/17 2000 07/14/17 2020  BP: (!) 147/58 139/60 114/68 (!) 113/51  Pulse: 95   94  Resp: (!) 22 18 (!) 29 20  Temp:      TempSrc:      SpO2: 98%   99%  Weight:      Height:       Wt Readings  from Last 3 Encounters:  07/14/17 56.7 kg (125 lb)  06/05/17 50.8 kg (112 lb)  05/31/17 51.3 kg (113 lb)    PHYSICAL EXAMINATION:  Physical Exam  Vitals reviewed. Constitutional: She is oriented to person, place, and time. She appears well-developed and well-nourished. No distress.  HENT:  Head: Normocephalic and atraumatic.  Mouth/Throat: Oropharynx is clear and moist.  Eyes: Pupils are equal, round, and reactive to light. Conjunctivae and EOM are normal. No scleral icterus.  Neck: Normal range of motion. Neck supple. No JVD present. No thyromegaly present.  Cardiovascular: Normal rate, regular rhythm and intact distal pulses.  Exam reveals no gallop and no friction rub.   No murmur heard. Respiratory: Effort normal and breath sounds normal. No respiratory distress. She has no wheezes. She has no rales.  GI: Soft. Bowel sounds are normal. She exhibits no distension. There is no tenderness.  Musculoskeletal: Normal range of motion. She exhibits no edema.  No arthritis, no gout  Lymphadenopathy:    She has no cervical adenopathy.  Neurological: She is alert  and oriented to person, place, and time. No cranial nerve deficit.  No dysarthria, no aphasia  Skin: Skin is warm and dry. No rash noted. No erythema.  Psychiatric: She has a normal mood and affect. Her behavior is normal. Judgment and thought content normal.    LABORATORY PANEL:   CBC  Recent Labs Lab 07/14/17 1758  WBC 4.9  HGB 8.0*  HCT 24.2*  PLT 206   ------------------------------------------------------------------------------------------------------------------  Chemistries   Recent Labs Lab 07/14/17 1758  NA 136  K 4.3  CL 105  CO2 23  GLUCOSE 247*  BUN 32*  CREATININE 1.19*  CALCIUM 9.3   ------------------------------------------------------------------------------------------------------------------  Cardiac Enzymes  Recent Labs Lab 07/14/17 1758  TROPONINI 0.06*   ------------------------------------------------------------------------------------------------------------------  RADIOLOGY:  Dg Chest 2 View  Result Date: 07/14/2017 CLINICAL DATA:  Weakness, dizziness, intermittent shortness of breath, smoker EXAM: CHEST  2 VIEW COMPARISON:  05/10/2017 FINDINGS: Stable hyperinflation, suspect COPD/ emphysema. Normal heart size and vascularity. No focal pneumonia, collapse or consolidation. Negative for edema, effusion or pneumothorax. Apical scarring noted. Trachea is midline. Atherosclerosis of the aorta. Scoliosis noted of the spine. IMPRESSION: Stable COPD/ emphysema. No interval change or superimposed acute process. Electronically Signed   By: Jerilynn Mages.  Shick M.D.   On: 07/14/2017 18:48    EKG:   Orders placed or performed during the hospital encounter of 07/14/17  . EKG 12-Lead  . EKG 12-Lead  . ED EKG within 10 minutes  . ED EKG within 10 minutes    IMPRESSION AND PLAN:  Principal Problem:   Exertional chest pain - trend cardiac enzymes, get an echocardiogram and cardiology consult Active Problems:   Diabetes mellitus with complication (HCC) -  sliding scale insulin with corresponding glucose checks   Essential hypertension - continue home meds   Chronic diastolic CHF (congestive heart failure) (HCC) - stable, continue home meds   HYPERCHOLESTEROLEMIA - home dose antilipid   Restless legs syndrome (RLS) - continue home meds  All the records are reviewed and case discussed with ED provider. Management plans discussed with the patient and/or family.  DVT PROPHYLAXIS: SubQ lovenox  GI PROPHYLAXIS: PPI  ADMISSION STATUS: Observation  CODE STATUS: Full Code Status History    Date Active Date Inactive Code Status Order ID Comments User Context   05/10/2017  8:00 PM 05/12/2017  2:04 PM Full Code [id number]  Vaughan Basta, MD Inpatient   07/05/2016  1:01 PM 07/08/2016  2:02 PM Full Code [id number]  Fritzi Mandes, MD Inpatient   10/13/2015  8:53 PM 10/14/2015  4:36 PM Full Code [id number]  Dustin Flock, MD Inpatient   03/10/2015  9:31 PM 03/13/2015  1:11 PM Full Code [id number]  Theodoro Grist, MD Inpatient      TOTAL TIME TAKING CARE OF THIS PATIENT: 45 minutes.   Rondalyn Belford [place] 07/14/2017, 10:04 PM  [facility]  Office  [phone]  CC: Primary care physician; Venia Carbon, MD  Note:  This document was prepared using Dragon voice recognition software and may include unintentional dictation errors.

## 2017-07-14 NOTE — ED Notes (Signed)
Pt reports weak and dizzy for 2 weeks.  No n/v/d   No chest pain. Intermittent sob.  cig smoker.  No fever decreased appetite.  Pt alert  Speech clear.

## 2017-07-14 NOTE — ED Provider Notes (Signed)
[facility] Emergency Department Provider Note  ____________________________________________   I have reviewed the triage vital signs and the nursing notes.   HISTORY  Chief Complaint Weakness and Dizziness    HPI Kathryn Ware is a 76 y.o. female  with a history of chronic anemia, acute renal failure in the past, tobacco abuse, last stress test none was in 2015 that she can think of, and more short of breath than normal for the last several days, she has had some degree of exertional shortness of breath and she had a little exertional chest pain a few days ago. She denies any ongoing chest pain. She also has a nonproductive cough. She denies fever or chills. She denies leg swelling or personal or family history of PE or DVT or recent travel,    Past Medical History:  Diagnosis Date  . Anemia   . ARF (acute renal failure) ([place])   . Blood transfusion without reported diagnosis   . Diabetes mellitus type II   . GI (gastrointestinal bleed)   . GI AVM (gastrointestinal arteriovenous vascular malformation)    stomach  . History of echocardiogram 12/2013   a. 12/2013: EF 60-65%, DD, mild LVH, nl RV size & systolic function, mild MR/TR, mild AS, nl RVSP; b. TTE 04/2017: EF 60-65%, normal wall motion, GR1DD, mild MR, left atrium normal in size, normal RV systolic function, PASP normal   . History of nuclear stress test    a. 12/2013: low risk, no sig WMA, nondiag EKG 2/2 baseline LVH w/ repol abnl, no sig ischemia, EF 70% no artifact  . HLD (hyperlipidemia)   . HTN (hypertension)   . Hypercholesterolemia   . IDA (iron deficiency anemia)   . Polyp of colon, adenomatous   . RLS (restless legs syndrome)     Patient Active Problem List   Diagnosis Date Noted  . Diastolic dysfunction 59/56/3875  . Abnormal EKG 05/11/2017  . Symptomatic anemia 05/10/2017  . Malnutrition of mild degree ([place]) 03/30/2017  . Anemia 07/05/2016  . Restless legs syndrome (RLS)  02/23/2016  . 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 Anemia due to gastrointestinal blood loss 02/18/2015  . Advance directive discussed with patient 02/18/2015  . Atypical chest pain 01/14/2014  . Nicotine dependence 10/29/2013  . Routine general medical examination at a health care facility 08/17/2011  . Diabetes mellitus with complication ([place]) 64/33/2951  . HYPERCHOLESTEROLEMIA 02/02/2007  . Essential hypertension 02/02/2007  . Angiodysplasia of intestinal tract 02/02/2007    Past Surgical History:  Procedure Laterality Date  . ABDOMINAL ADHESION SURGERY  11/2001   Dr. Tamala Julian  . ABDOMINAL HYSTERECTOMY  1976   RSO (fibroid)  . COLONOSCOPY WITH PROPOFOL N/A 07/07/2016   Procedure: COLONOSCOPY WITH PROPOFOL;  Surgeon: Manya Silvas, MD;  Location: [facility] ENDOSCOPY;  Service: Endoscopy;  Laterality: N/A;  . COLONOSCOPY WITH PROPOFOL N/A 12/14/2016   Procedure: COLONOSCOPY WITH PROPOFOL;  Surgeon: Manya Silvas, MD;  Location: [facility] ENDOSCOPY;  Service: Endoscopy;  Laterality: N/A;  . DOBUTAMINE STRESS ECHO  11/10   normal  . DOPPLER ECHOCARDIOGRAPHY     EF 55%, mild MR, TR  . ESOPHAGOGASTRODUODENOSCOPY  07/2006   multiple angioectasias  . ESOPHAGOGASTRODUODENOSCOPY Left 03/12/2015   Procedure: place tube in throat and evaluate stomach and duodenum for source of bleeding. Cauterize if concern for rebleeding.;  Surgeon: Hulen Luster, MD;  Location: [facility] ENDOSCOPY;  Service: Endoscopy;  Laterality: Left;  . ESOPHAGOGASTRODUODENOSCOPY N/A 12/14/2016   Procedure: ESOPHAGOGASTRODUODENOSCOPY (EGD);  Surgeon: Manya Silvas, MD;  Location: [facility] ENDOSCOPY;  Service: Endoscopy;  Laterality: N/A;  . ESOPHAGOGASTRODUODENOSCOPY (EGD) WITH PROPOFOL N/A 11/23/2015   Procedure: ESOPHAGOGASTRODUODENOSCOPY (EGD) WITH PROPOFOL;  Surgeon: Hulen Luster, MD;  Location: [facility] ENDOSCOPY;  Service: Gastroenterology;  Laterality: N/A;  . ESOPHAGOGASTRODUODENOSCOPY (EGD) WITH PROPOFOL N/A 07/07/2016    Procedure: ESOPHAGOGASTRODUODENOSCOPY (EGD) WITH PROPOFOL;  Surgeon: Manya Silvas, MD;  Location: [facility] ENDOSCOPY;  Service: Endoscopy;  Laterality: N/A;  . ESOPHAGOGASTRODUODENOSCOPY (EGD) WITH PROPOFOL N/A 05/19/2017   Procedure: ESOPHAGOGASTRODUODENOSCOPY (EGD) WITH PROPOFOL;  Surgeon: Manya Silvas, MD;  Location: [facility] ENDOSCOPY;  Service: Endoscopy;  Laterality: N/A;  . laryngeal polyp  10/2008   Dr. Tami Ribas  . TOP      Prior to Admission medications   Medication Sig Start Date End Date Taking? Authorizing Provider  clonazePAM (KLONOPIN) 0.5 MG tablet Take 1 tablet (0.5 mg total) by mouth daily as needed. 08/03/16   Venia Carbon, MD  ferrous sulfate 325 (65 FE) MG tablet Take 325 mg by mouth 2 (two) times daily.     [provider]  lisinopril-hydrochlorothiazide (PRINZIDE,ZESTORETIC) 20-12.5 MG tablet Take 1 tablet by mouth daily. Patient taking differently: Take 2 tablets by mouth daily.  07/20/16   Venia Carbon, MD  metFORMIN (GLUCOPHAGE-XR) 500 MG 24 hr tablet Take 500 mg by mouth daily with breakfast.    [provider]  omeprazole (PRILOSEC) 40 MG capsule TAKE 1 CAPSULE(40 MG) BY MOUTH TWICE DAILY BEFORE A MEAL 04/28/16   Venia Carbon, MD  ONE TOUCH ULTRA TEST test strip TEST AS DIRECTED ONCE DAILY 07/04/17   Venia Carbon, MD  simvastatin (ZOCOR) 80 MG tablet TAKE 1 TABLET(80 MG) BY MOUTH DAILY 05/26/17   Venia Carbon, MD  sucralfate (CARAFATE) 1 g tablet TAKE 1 TABLET BY MOUTH EVERY NIGHT BEFORE MEALS AND AT BEDTIME 03/27/17   Venia Carbon, MD  vitamin B-12 (CYANOCOBALAMIN) 1000 MCG tablet Take 1,000 mcg by mouth daily.    [provider]    Allergies Metformin and Nifedipine  Family History  Problem Relation Age of Onset  . Stroke Father   . Cancer Sister        breast cancer  . Hypertension Mother   . Hypertension Unknown        sibling  . Diabetes Unknown        grandmother    Social History Social History   Substance Use Topics  . Smoking status: Current Every Day Smoker    Packs/day: 1.00    Types: Cigarettes  . Smokeless tobacco: Never Used  . Alcohol use No    Review of Systems Constitutional: No fever/chills Eyes: No visual changes. ENT: No sore throat. No stiff neck no neck pain Cardiovascular: See history of present illness Respiratory: See history of present illness  Gastrointestinal:   no vomiting.  No diarrhea.  No constipation. Genitourinary: Negative for dysuria. Musculoskeletal: Negative lower extremity swelling Skin: Negative for rash. Neurological: Negative for severe headaches, focal weakness or numbness.   ____________________________________________   PHYSICAL EXAM:  VITAL SIGNS: ED Triage Vitals  Enc Vitals Group     BP 07/14/17 1752 129/70     Pulse Rate 07/14/17 1752 (!) 104     Resp 07/14/17 1752 20     Temp 07/14/17 1752 98.6 F (37 C)     Temp Source 07/14/17 1752 Oral     SpO2 07/14/17 1752 99 %     Weight 07/14/17 1749  125 lb (56.7 kg)     Height 07/14/17 1749 5\' 5"  (1.651 m)     Head Circumference --      Peak Flow --      Pain Score --      Pain Loc --      Pain Edu? --      Excl. in [place]? --     Constitutional: Alert and oriented. Well appearing and in no acute distress. Eyes: Conjunctivae are normal Head: Atraumatic HEENT: No congestion/rhinnorhea. Mucous membranes are moist.  Oropharynx non-erythematous Neck:   Nontender with no meningismus, no masses, no stridor Cardiovascular: Normal rate, regular rhythm. Grossly normal heart sounds.  Good peripheral circulation. Respiratory: Normal respiratory effort.  No retractions. Somewhat diminished in the bases no acute wheezes or rales noted Abdominal: Soft and nontender. No distention. No guarding no rebound Back:  There is no focal tenderness or step off.  there is no midline tenderness there are no lesions noted. there is no CVA tenderness Musculoskeletal: No lower extremity tenderness, no  upper extremity tenderness. No joint effusions, no DVT signs strong distal pulses no edema Neurologic:  Normal speech and language. No gross focal neurologic deficits are appreciated.  Skin:  Skin is warm, dry and intact. No rash noted. Psychiatric: Mood and affect are normal. Speech and behavior are normal.  ____________________________________________   LABS (all labs ordered are listed, but only abnormal results are displayed)  Labs Reviewed  BASIC METABOLIC PANEL - Abnormal; Notable for the following:       Result Value   Glucose, Bld 247 (*)    BUN 32 (*)    Creatinine, Ser 1.19 (*)    GFR calc non Af Amer 44 (*)    GFR calc Af Amer 50 (*)    All other components within normal limits  CBC - Abnormal; Notable for the following:    RBC 2.79 (*)    Hemoglobin 8.0 (*)    HCT 24.2 (*)    All other components within normal limits  TROPONIN I - Abnormal; Notable for the following:    Troponin I 0.06 (*)    All other components within normal limits  BRAIN NATRIURETIC PEPTIDE    Pertinent labs  results that were available during my care of the patient were reviewed by me and considered in my medical decision making (see chart for details). ____________________________________________  EKG  I personally interpreted any EKGs ordered by me or triage Sinus tachycardia rate 103 there are no acute ST elevation there is flipped T waves laterally, normal axis. ____________________________________________  RADIOLOGY  Pertinent labs & imaging results that were available during my care of the patient were reviewed by me and considered in my medical decision making (see chart for details). If possible, patient and/or family made aware of any abnormal findings. ____________________________________________    PROCEDURES  Procedure(s) performed: None  Procedures  Critical Care performed: None  ____________________________________________   INITIAL IMPRESSION / ASSESSMENT AND PLAN  / ED COURSE  Pertinent labs & imaging results that were available during my care of the patient were reviewed by me and considered in my medical decision making (see chart for details).  Patient here with shortness of breath and occasional chest pain with exertion. Initial troponin is 0.06. She has no ongoing chest pain. I did give her a nebulizer treatment which has made her feel better in terms of her breathing but I am concerned about the troponin and her history of tobacco abuse. She has  not had a heart catheterization in the past and I can find, she does have a history of a last echo performed in July of this year which shows EF of 6065% moderate LVH   ____________________________________________   FINAL CLINICAL IMPRESSION(S) / ED DIAGNOSES  Final diagnoses:  None      This chart was dictated using voice recognition software.  Despite best efforts to proofread,  errors can occur which can change meaning.      Schuyler Amor, MD 07/14/17 2013

## 2017-07-14 NOTE — ED Notes (Signed)
Pt waiting on admission.  No chest pain.  nsr on monitor.

## 2017-07-14 NOTE — ED Notes (Signed)
Pt transport to 257

## 2017-07-14 NOTE — ED Notes (Signed)
Report called to [place] rn floor nurse

## 2017-07-15 DIAGNOSIS — R079 Chest pain, unspecified: Secondary | ICD-10-CM | POA: Diagnosis not present

## 2017-07-15 DIAGNOSIS — E119 Type 2 diabetes mellitus without complications: Secondary | ICD-10-CM | POA: Diagnosis not present

## 2017-07-15 DIAGNOSIS — D649 Anemia, unspecified: Secondary | ICD-10-CM | POA: Diagnosis not present

## 2017-07-15 DIAGNOSIS — I5032 Chronic diastolic (congestive) heart failure: Secondary | ICD-10-CM | POA: Diagnosis not present

## 2017-07-15 DIAGNOSIS — I10 Essential (primary) hypertension: Secondary | ICD-10-CM | POA: Diagnosis not present

## 2017-07-15 DIAGNOSIS — I248 Other forms of acute ischemic heart disease: Secondary | ICD-10-CM

## 2017-07-15 LAB — CBC
HEMATOCRIT: 24.8 % — AB (ref 35.0–47.0)
HEMOGLOBIN: 8.3 g/dL — AB (ref 12.0–16.0)
MCH: 28.7 pg (ref 26.0–34.0)
MCHC: 33.3 g/dL (ref 32.0–36.0)
MCV: 86.2 fL (ref 80.0–100.0)
Platelets: 215 10*3/uL (ref 150–440)
RBC: 2.88 MIL/uL — ABNORMAL LOW (ref 3.80–5.20)
RDW: 13.6 % (ref 11.5–14.5)
WBC: 4.6 10*3/uL (ref 3.6–11.0)

## 2017-07-15 LAB — TROPONIN I
TROPONIN I: 0.07 ng/mL — AB (ref <0.03)
TROPONIN I: 0.06 ng/mL — AB (ref <0.03)
Troponin I: 0.06 ng/mL (ref <0.03)

## 2017-07-15 LAB — BASIC METABOLIC PANEL
ANION GAP: 7 (ref 5–15)
BUN: 24 mg/dL — AB (ref 6–20)
CHLORIDE: 110 mmol/L (ref 101–111)
CO2: 23 mmol/L (ref 22–32)
Calcium: 9.4 mg/dL (ref 8.9–10.3)
Creatinine, Ser: 0.94 mg/dL (ref 0.44–1.00)
GFR calc Af Amer: >60 mL/min (ref >60)
GFR calc non Af Amer: 58 mL/min — ABNORMAL LOW (ref >60)
GLUCOSE: 99 mg/dL (ref 65–99)
POTASSIUM: 4.1 mmol/L (ref 3.5–5.1)
SODIUM: 140 mmol/L (ref 135–145)

## 2017-07-15 LAB — GLUCOSE, CAPILLARY
GLUCOSE-CAPILLARY: 106 mg/dL — AB (ref 65–99)
Glucose-Capillary: 111 mg/dL — ABNORMAL HIGH (ref 65–99)

## 2017-07-15 NOTE — Progress Notes (Signed)
Patient had a 12 run of SVT.  She was assumption, sitting at the side of the bed watching TV. Dr. Anselm Jungling notified.

## 2017-07-15 NOTE — Progress Notes (Signed)
Went over discharge instructions includin follow up appointment with the patient. Discontinue peripheral IV and telemetry monitor. Will help patient transport.

## 2017-07-15 NOTE — Progress Notes (Signed)
Patient admitted to room 257 with the diagnosis of exertional chest pain. Alert and oriented x 4. Denied any acute  chest pain at this moment. Patient oriented to her room, call bell/ascom. Patient declined password. Home medications taken to the pharmacy. Patient voiced no concerns. Will continue to monitor.

## 2017-07-15 NOTE — Consult Note (Signed)
Cardiology Consultation:   Patient ID: Kathryn Ware; [id number]; 05/03/1941   Admit date: 07/14/2017 Date of Consult: 07/15/2017  Primary Care Provider: Venia Carbon, MD Primary Cardiologist: Fletcher Anon Primary Electrophysiologist:     Patient Profile:   Kathryn Ware is a 76 y.o. female with a hx of Diabetes mellitus, hypertension, hyperlipidemia and recent blood loss anemia in July who is being seen today for the evaluation of mildly elevated troponin at the request of Dr. Jannifer Franklin.  History of Present Illness:   Ms. Kathryn Ware 76 year old female with multiple chronic medical problems listed above. She was hospitalized in July with blood loss anemia requiring transfusion with a hemoglobin of 7.1. At that time, she complained of dizziness and chest pain. Symptoms improved with transfusion. She had an echocardiogram done which showed normal LV systolic function with moderate LVH. The patient overall is a poor historian and reports coming to the hospital due to dizziness. Chest pain is mentioned in the history and physical. However, the patient denies any chest pain presently or recently. She wants to go home. She reports that shortness of breath is stable. Troponin was noted to be borderline elevated at 0.06. This did not increase with subsequent testing. EKG with sinus rhythm, LVH with no significant ST or T wave changes.  The patient was supposed to follow-up with [place] in the outpatient after her hospitalization in July for stress testing but that never happened.  Past Medical History:  Diagnosis Date  . Anemia   . ARF (acute renal failure) ([place])   . Blood transfusion without reported diagnosis   . Diabetes mellitus type II   . GI (gastrointestinal bleed)   . GI AVM (gastrointestinal arteriovenous vascular malformation)    stomach  . History of echocardiogram 12/2013   a. 12/2013: EF 60-65%, DD, mild LVH, nl RV size & systolic function, mild MR/TR, mild AS, nl RVSP; b. TTE 04/2017: EF  60-65%, normal wall motion, GR1DD, mild MR, left atrium normal in size, normal RV systolic function, PASP normal   . History of nuclear stress test    a. 12/2013: low risk, no sig WMA, nondiag EKG 2/2 baseline LVH w/ repol abnl, no sig ischemia, EF 70% no artifact  . HLD (hyperlipidemia)   . HTN (hypertension)   . Hypercholesterolemia   . IDA (iron deficiency anemia)   . Polyp of colon, adenomatous   . RLS (restless legs syndrome)     Past Surgical History:  Procedure Laterality Date  . ABDOMINAL ADHESION SURGERY  11/2001   Dr. Tamala Julian  . ABDOMINAL HYSTERECTOMY  1976   RSO (fibroid)  . COLONOSCOPY WITH PROPOFOL N/A 07/07/2016   Procedure: COLONOSCOPY WITH PROPOFOL;  Surgeon: Manya Silvas, MD;  Location: [facility] ENDOSCOPY;  Service: Endoscopy;  Laterality: N/A;  . COLONOSCOPY WITH PROPOFOL N/A 12/14/2016   Procedure: COLONOSCOPY WITH PROPOFOL;  Surgeon: Manya Silvas, MD;  Location: [facility] ENDOSCOPY;  Service: Endoscopy;  Laterality: N/A;  . DOBUTAMINE STRESS ECHO  11/10   normal  . DOPPLER ECHOCARDIOGRAPHY     EF 55%, mild MR, TR  . ESOPHAGOGASTRODUODENOSCOPY  07/2006   multiple angioectasias  . ESOPHAGOGASTRODUODENOSCOPY Left 03/12/2015   Procedure: place tube in throat and evaluate stomach and duodenum for source of bleeding. Cauterize if concern for rebleeding.;  Surgeon: Hulen Luster, MD;  Location: [facility] ENDOSCOPY;  Service: Endoscopy;  Laterality: Left;  . ESOPHAGOGASTRODUODENOSCOPY N/A 12/14/2016   Procedure: ESOPHAGOGASTRODUODENOSCOPY (EGD);  Surgeon: Manya Silvas, MD;  Location: [place];  Service: Endoscopy;  Laterality: N/A;  . ESOPHAGOGASTRODUODENOSCOPY (EGD) WITH PROPOFOL N/A 11/23/2015   Procedure: ESOPHAGOGASTRODUODENOSCOPY (EGD) WITH PROPOFOL;  Surgeon: Hulen Luster, MD;  Location: [facility] ENDOSCOPY;  Service: Gastroenterology;  Laterality: N/A;  . ESOPHAGOGASTRODUODENOSCOPY (EGD) WITH PROPOFOL N/A 07/07/2016   Procedure: ESOPHAGOGASTRODUODENOSCOPY (EGD) WITH PROPOFOL;   Surgeon: Manya Silvas, MD;  Location: [facility] ENDOSCOPY;  Service: Endoscopy;  Laterality: N/A;  . ESOPHAGOGASTRODUODENOSCOPY (EGD) WITH PROPOFOL N/A 05/19/2017   Procedure: ESOPHAGOGASTRODUODENOSCOPY (EGD) WITH PROPOFOL;  Surgeon: Manya Silvas, MD;  Location: [facility] ENDOSCOPY;  Service: Endoscopy;  Laterality: N/A;  . laryngeal polyp  10/2008   Dr. Tami Ribas  . TOP       Home Medications:  Prior to Admission medications   Medication Sig Start Date End Date Taking? Authorizing Provider  clonazePAM (KLONOPIN) 0.5 MG tablet Take 1 tablet (0.5 mg total) by mouth daily as needed. Patient taking differently: Take 0.5 mg by mouth daily as needed for anxiety.  08/03/16  Yes Viviana Simpler I, MD  ferrous sulfate 325 (65 FE) MG tablet Take 325 mg by mouth 2 (two) times daily.    Yes [provider]  lisinopril-hydrochlorothiazide (PRINZIDE,ZESTORETIC) 20-12.5 MG tablet Take 1 tablet by mouth daily. 07/20/16  Yes Venia Carbon, MD  metFORMIN (GLUCOPHAGE-XR) 500 MG 24 hr tablet Take 500 mg by mouth every evening.    Yes [provider]  omeprazole (PRILOSEC) 40 MG capsule Take 40 mg by mouth 2 (two) times daily as needed. For heartburn   Yes [provider]  ONE TOUCH ULTRA TEST test strip TEST AS DIRECTED ONCE DAILY 07/04/17  Yes Venia Carbon, MD  simvastatin (ZOCOR) 80 MG tablet Take 80 mg by mouth at bedtime.   Yes [provider]  sucralfate (CARAFATE) 1 g tablet Take 1 g by mouth 4 (four) times daily as needed. For indigestion   Yes [provider]  vitamin B-12 (CYANOCOBALAMIN) 1000 MCG tablet Take 1,000 mcg by mouth daily.   Yes [provider]    Inpatient Medications: Scheduled Meds: . atorvastatin  40 mg Oral q1800  . enoxaparin (LOVENOX) injection  40 mg Subcutaneous Q24H  . lisinopril  20 mg Oral Daily   And  . hydrochlorothiazide  12.5 mg Oral Daily  . insulin aspart  0-5 Units Subcutaneous QHS  . insulin aspart  0-9 Units  Subcutaneous TID WC  . pantoprazole  40 mg Oral BID   Continuous Infusions:  PRN Meds: acetaminophen **OR** acetaminophen, clonazePAM, ondansetron **OR** ondansetron (ZOFRAN) IV, oxyCODONE  Allergies:    Allergies  Allergen Reactions  . Metformin     REACTION: diarrhea when dosage is increased, patient aware that she is onmetformin when it is listed that she is allergic  . Nifedipine     REACTION: cough    Social History:   Social History   Social History  . Marital status: Widowed    Spouse name: N/A  . Number of children: 1  . Years of education: N/A   Occupational History  . Seamstress         Social History Main Topics  . Smoking status: Current Every Day Smoker    Packs/day: 1.00    Types: Cigarettes  . Smokeless tobacco: Never Used  . Alcohol use No  . Drug use: No  . Sexual activity: Not on file   Other Topics Concern  . Not on file   Social History Narrative   No living will   Requests son Ronnald Nian  Cobb as health care POA   Would accept resuscitation --but no prolonged machines   Not sure about feeding tubes    Family History:    Family History  Problem Relation Age of Onset  . Stroke Father   . Cancer Sister        breast cancer  . Hypertension Mother   . Hypertension Unknown        sibling  . Diabetes Unknown        grandmother     ROS:  Please see the history of present illness.  ROS  All other ROS reviewed and negative.     Physical Exam/Data:   Vitals:   07/14/17 2257 07/15/17 0547 07/15/17 0931 07/15/17 1114  BP: (!) 131/46 (!) 124/48 99/77 (!) 118/45  Pulse: 78 75 80 66  Resp: 18 16 14 18   Temp: 97.9 F (36.6 C) 98.3 F (36.8 C) 97.6 F (36.4 C) 98.9 F (37.2 C)  TempSrc: Oral Oral Oral Oral  SpO2: 100% 100% 98% 100%  Weight: 110 lb (49.9 kg)     Height: 5\' 4"  (1.626 m)       Intake/Output Summary (Last 24 hours) at 07/15/17 1343 Last data filed at 07/15/17 0548  Gross per 24 hour  Intake                0 ml    Output              150 ml  Net             -150 ml   Filed Weights   07/14/17 1749 07/14/17 2257  Weight: 125 lb (56.7 kg) 110 lb (49.9 kg)   Body mass index is 18.88 kg/m.  General:  Well nourished, well developed, in no acute distress HEENT: normal Lymph: no adenopathy Neck: no JVD Endocrine:  No thryomegaly Vascular: No carotid bruits; FA pulses 2+ bilaterally without bruits  Cardiac:  normal S1, S2; RRR; 2/6 systolic ejection murmur at the base Lungs:  clear to auscultation bilaterally, no wheezing, rhonchi or rales  Abd: soft, nontender, no hepatomegaly  Ext: no edema Musculoskeletal:  No deformities, BUE and BLE strength normal and equal Skin: warm and dry  Neuro:  CNs 2-12 intact, no focal abnormalities noted Psych:  Normal affect   EKG:  The EKG was personally reviewed and demonstrates: Normal sinus rhythm with LVH and nonspecific T wave changes. Telemetry:  Telemetry was personally reviewed and demonstrates: Normal sinus rhythm with one episode of brief SVT with a total of 12 beats.  Relevant CV Studies:   Laboratory Data:  Chemistry Recent Labs Lab 07/14/17 1758 07/15/17 0607  NA 136 140  K 4.3 4.1  CL 105 110  CO2 23 23  GLUCOSE 247* 99  BUN 32* 24*  CREATININE 1.19* 0.94  CALCIUM 9.3 9.4  GFRNONAA 44* 58*  GFRAA 50* >60  ANIONGAP 8 7    No results for input(s): PROT, ALBUMIN, AST, ALT, ALKPHOS, BILITOT in the last 168 hours. Hematology Recent Labs Lab 07/14/17 1758 07/15/17 0607  WBC 4.9 4.6  RBC 2.79* 2.88*  HGB 8.0* 8.3*  HCT 24.2* 24.8*  MCV 86.8 86.2  MCH 28.6 28.7  MCHC 32.9 33.3  RDW 13.7 13.6  PLT 206 215   Cardiac Enzymes Recent Labs Lab 07/14/17 1753 07/14/17 1758 07/15/17 0031 07/15/17 0607 07/15/17 1207  TROPONINI 0.07* 0.06* 0.06* 0.07* 0.06*   No results for input(s): TROPIPOC in the last 168 hours.  BNP  Recent Labs Lab 07/14/17 1758  BNP 73.0    DDimer No results for input(s): DDIMER in the last 168  hours.  Radiology/Studies:  Dg Chest 2 View  Result Date: 07/14/2017 CLINICAL DATA:  Weakness, dizziness, intermittent shortness of breath, smoker EXAM: CHEST  2 VIEW COMPARISON:  05/10/2017 FINDINGS: Stable hyperinflation, suspect COPD/ emphysema. Normal heart size and vascularity. No focal pneumonia, collapse or consolidation. Negative for edema, effusion or pneumothorax. Apical scarring noted. Trachea is midline. Atherosclerosis of the aorta. Scoliosis noted of the spine. IMPRESSION: Stable COPD/ emphysema. No interval change or superimposed acute process. Electronically Signed   By: Jerilynn Mages.  Shick M.D.   On: 07/14/2017 18:48    Assessment and Plan:   1. Borderline elevated troponin, likely due to demand ischemia in the setting of underlying anemia and left ventricular hypertrophy. The patient denies any chest pain and her only complaint is mild dizziness. Recent echocardiogram showed normal LV systolic function. I recommend outpatient evaluation with a pharmacologic nuclear stress test. I discussed this with the patient and I provided her with my card and will arrange for follow-up in our office. 2. Blood loss anemia: She continues to be significantly anemic. She reports taking iron tablets regularly. She has history of AV malformation. She has been seen by GI and ready. Consider outpatient hematology management for intravenous iron.   For questions or updates, please contact [place] Please consult [email or web address] for contact info under Cardiology/STEMI.   Signed, Kathlyn Sacramento, MD  07/15/2017 1:43 PM

## 2017-07-16 LAB — HEMOGLOBIN A1C
Hgb A1c MFr Bld: 5.1 % (ref 4.8–5.6)
Mean Plasma Glucose: 99.67 mg/dL

## 2017-07-17 ENCOUNTER — Telehealth: Payer: Self-pay | Admitting: Cardiovascular Disease

## 2017-07-17 ENCOUNTER — Other Ambulatory Visit: Payer: Self-pay

## 2017-07-17 DIAGNOSIS — R778 Other specified abnormalities of plasma proteins: Secondary | ICD-10-CM

## 2017-07-17 DIAGNOSIS — R7989 Other specified abnormal findings of blood chemistry: Principal | ICD-10-CM

## 2017-07-17 DIAGNOSIS — R42 Dizziness and giddiness: Secondary | ICD-10-CM

## 2017-07-17 NOTE — Telephone Encounter (Signed)
Dr. Fletcher Anon saw pt in ED. Elevated troponin and dizzy. He has ordered a lexi myoview and f/u after. I s/w pt, reviewed test. Pt is agreeable. Reviewed instructions; pt wrote them down and repeated back to me. States she has someone that lives with her who can provide transportation. Scheduled 9/28, 7:45am arrival for which pt agrees.  Will call her back after testing to make a f/u appt with Dr. Fletcher Anon.

## 2017-07-18 ENCOUNTER — Emergency Department: Payer: Medicare Other

## 2017-07-18 ENCOUNTER — Encounter: Payer: Self-pay | Admitting: Emergency Medicine

## 2017-07-18 ENCOUNTER — Inpatient Hospital Stay
Admission: EM | Admit: 2017-07-18 | Discharge: 2017-07-19 | DRG: 378 | Disposition: A | Discharge status: Home / Self Care | Payer: Medicare Other | Attending: Internal Medicine | Admitting: Internal Medicine

## 2017-07-18 DIAGNOSIS — R531 Weakness: Secondary | ICD-10-CM | POA: Diagnosis not present

## 2017-07-18 DIAGNOSIS — E7800 Pure hypercholesterolemia, unspecified: Secondary | ICD-10-CM | POA: Diagnosis present

## 2017-07-18 DIAGNOSIS — R05 Cough: Secondary | ICD-10-CM | POA: Diagnosis not present

## 2017-07-18 DIAGNOSIS — Z79899 Other long term (current) drug therapy: Secondary | ICD-10-CM | POA: Diagnosis not present

## 2017-07-18 DIAGNOSIS — D509 Iron deficiency anemia, unspecified: Secondary | ICD-10-CM | POA: Diagnosis present

## 2017-07-18 DIAGNOSIS — E785 Hyperlipidemia, unspecified: Secondary | ICD-10-CM | POA: Diagnosis present

## 2017-07-18 DIAGNOSIS — E119 Type 2 diabetes mellitus without complications: Secondary | ICD-10-CM | POA: Diagnosis present

## 2017-07-18 DIAGNOSIS — D500 Iron deficiency anemia secondary to blood loss (chronic): Secondary | ICD-10-CM

## 2017-07-18 DIAGNOSIS — K922 Gastrointestinal hemorrhage, unspecified: Secondary | ICD-10-CM

## 2017-07-18 DIAGNOSIS — G2581 Restless legs syndrome: Secondary | ICD-10-CM | POA: Diagnosis present

## 2017-07-18 DIAGNOSIS — M6281 Muscle weakness (generalized): Secondary | ICD-10-CM | POA: Diagnosis not present

## 2017-07-18 DIAGNOSIS — J449 Chronic obstructive pulmonary disease, unspecified: Secondary | ICD-10-CM | POA: Diagnosis not present

## 2017-07-18 DIAGNOSIS — I10 Essential (primary) hypertension: Secondary | ICD-10-CM | POA: Diagnosis present

## 2017-07-18 DIAGNOSIS — Z7984 Long term (current) use of oral hypoglycemic drugs: Secondary | ICD-10-CM

## 2017-07-18 DIAGNOSIS — D62 Acute posthemorrhagic anemia: Secondary | ICD-10-CM | POA: Diagnosis present

## 2017-07-18 DIAGNOSIS — I509 Heart failure, unspecified: Secondary | ICD-10-CM | POA: Diagnosis not present

## 2017-07-18 DIAGNOSIS — D649 Anemia, unspecified: Secondary | ICD-10-CM

## 2017-07-18 DIAGNOSIS — Z888 Allergy status to other drugs, medicaments and biological substances status: Secondary | ICD-10-CM | POA: Diagnosis not present

## 2017-07-18 DIAGNOSIS — F17210 Nicotine dependence, cigarettes, uncomplicated: Secondary | ICD-10-CM | POA: Diagnosis present

## 2017-07-18 DIAGNOSIS — I110 Hypertensive heart disease with heart failure: Secondary | ICD-10-CM | POA: Diagnosis not present

## 2017-07-18 DIAGNOSIS — K5520 Angiodysplasia of colon without hemorrhage: Secondary | ICD-10-CM | POA: Diagnosis not present

## 2017-07-18 DIAGNOSIS — R079 Chest pain, unspecified: Secondary | ICD-10-CM | POA: Diagnosis not present

## 2017-07-18 DIAGNOSIS — Z86010 Personal history of colonic polyps: Secondary | ICD-10-CM

## 2017-07-18 DIAGNOSIS — Z90710 Acquired absence of both cervix and uterus: Secondary | ICD-10-CM

## 2017-07-18 DIAGNOSIS — I248 Other forms of acute ischemic heart disease: Secondary | ICD-10-CM | POA: Diagnosis not present

## 2017-07-18 DIAGNOSIS — K31819 Angiodysplasia of stomach and duodenum without bleeding: Secondary | ICD-10-CM | POA: Diagnosis not present

## 2017-07-18 DIAGNOSIS — Z8249 Family history of ischemic heart disease and other diseases of the circulatory system: Secondary | ICD-10-CM

## 2017-07-18 DIAGNOSIS — K31811 Angiodysplasia of stomach and duodenum with bleeding: Principal | ICD-10-CM | POA: Diagnosis present

## 2017-07-18 LAB — PREPARE RBC (CROSSMATCH)

## 2017-07-18 LAB — BASIC METABOLIC PANEL
ANION GAP: 9 (ref 5–15)
BUN: 30 mg/dL — AB (ref 6–20)
CALCIUM: 9.3 mg/dL (ref 8.9–10.3)
CO2: 22 mmol/L (ref 22–32)
CREATININE: 1.08 mg/dL — AB (ref 0.44–1.00)
Chloride: 108 mmol/L (ref 101–111)
GFR calc Af Amer: 57 mL/min — ABNORMAL LOW (ref >60)
GFR, EST NON AFRICAN AMERICAN: 49 mL/min — AB (ref >60)
GLUCOSE: 146 mg/dL — AB (ref 65–99)
Potassium: 3.7 mmol/L (ref 3.5–5.1)
Sodium: 139 mmol/L (ref 135–145)

## 2017-07-18 LAB — URINALYSIS, COMPLETE (UACMP) WITH MICROSCOPIC
BILIRUBIN URINE: NEGATIVE
Bacteria, UA: NONE SEEN
Glucose, UA: NEGATIVE mg/dL
HGB URINE DIPSTICK: NEGATIVE
Ketones, ur: NEGATIVE mg/dL
Leukocytes, UA: NEGATIVE
NITRITE: NEGATIVE
PH: 5 (ref 5.0–8.0)
Protein, ur: NEGATIVE mg/dL
RBC / HPF: NONE SEEN RBC/hpf (ref 0–5)
SPECIFIC GRAVITY, URINE: 1.015 (ref 1.005–1.030)
WBC, UA: NONE SEEN WBC/hpf (ref 0–5)

## 2017-07-18 LAB — CBC
HCT: 18.2 % — ABNORMAL LOW (ref 35.0–47.0)
Hemoglobin: 6 g/dL — ABNORMAL LOW (ref 12.0–16.0)
MCH: 28.6 pg (ref 26.0–34.0)
MCHC: 33.2 g/dL (ref 32.0–36.0)
MCV: 86 fL (ref 80.0–100.0)
PLATELETS: 221 10*3/uL (ref 150–440)
RBC: 2.11 MIL/uL — ABNORMAL LOW (ref 3.80–5.20)
RDW: 14 % (ref 11.5–14.5)
WBC: 8 10*3/uL (ref 3.6–11.0)

## 2017-07-18 LAB — IRON AND TIBC
Iron: 70 ug/dL (ref 28–170)
SATURATION RATIOS: 20 % (ref 10.4–31.8)
TIBC: 352 ug/dL (ref 250–450)
UIBC: 282 ug/dL

## 2017-07-18 LAB — GLUCOSE, CAPILLARY: GLUCOSE-CAPILLARY: 195 mg/dL — AB (ref 65–99)

## 2017-07-18 LAB — FERRITIN: FERRITIN: 10 ng/mL — AB (ref 11–307)

## 2017-07-18 MED ORDER — SODIUM CHLORIDE 0.9 % IV SOLN
Freq: Once | INTRAVENOUS | Status: AC
  Administered 2017-07-18: 21:00:00 via INTRAVENOUS

## 2017-07-18 MED ORDER — LISINOPRIL-HYDROCHLOROTHIAZIDE 20-12.5 MG PO TABS: 1.0000 | ORAL_TABLET | Freq: Every day | ORAL | Status: DC

## 2017-07-18 MED ORDER — SODIUM CHLORIDE 0.9 % IV BOLUS (SEPSIS)
500.0000 mL | Freq: Once | INTRAVENOUS | Status: AC
  Administered 2017-07-18: 500 mL via INTRAVENOUS

## 2017-07-18 MED ORDER — INSULIN ASPART 100 UNIT/ML ~~LOC~~ SOLN: 0.0000 [IU] | Freq: Three times a day (TID) | SUBCUTANEOUS | Status: DC

## 2017-07-18 MED ORDER — FERROUS SULFATE 325 (65 FE) MG PO TABS: 325.0000 mg | ORAL_TABLET | Freq: Two times a day (BID) | ORAL | Status: DC

## 2017-07-18 MED ORDER — SODIUM CHLORIDE 0.9 % IV SOLN
INTRAVENOUS | Status: DC
  Administered 2017-07-18 – 2017-07-19 (×3): via INTRAVENOUS

## 2017-07-18 MED ORDER — CLONAZEPAM 0.5 MG PO TABS
0.5000 mg | ORAL_TABLET | Freq: Every day | ORAL | Status: DC | PRN
  Administered 2017-07-18: 0.5 mg via ORAL
  Filled 2017-07-18: qty 1

## 2017-07-18 MED ORDER — ATORVASTATIN CALCIUM 20 MG PO TABS: 40.0000 mg | ORAL_TABLET | Freq: Every day | ORAL | Status: DC

## 2017-07-18 MED ORDER — LISINOPRIL 20 MG PO TABS: 20.0000 mg | ORAL_TABLET | Freq: Every day | ORAL | Status: DC

## 2017-07-18 MED ORDER — VITAMIN B-12 1000 MCG PO TABS: 1000.0000 ug | ORAL_TABLET | Freq: Every day | ORAL | Status: DC

## 2017-07-18 MED ORDER — INSULIN ASPART 100 UNIT/ML ~~LOC~~ SOLN
0.0000 [IU] | Freq: Every day | SUBCUTANEOUS | Status: DC
Start: 2017-07-18 — End: 2017-07-19

## 2017-07-18 MED ORDER — HYDROCHLOROTHIAZIDE 12.5 MG PO CAPS: 12.5000 mg | ORAL_CAPSULE | Freq: Every day | ORAL | Status: DC

## 2017-07-18 MED ORDER — PANTOPRAZOLE SODIUM 40 MG IV SOLR
40.0000 mg | Freq: Two times a day (BID) | INTRAVENOUS | Status: DC
  Administered 2017-07-18: 40 mg via INTRAVENOUS
  Filled 2017-07-18: qty 40

## 2017-07-18 MED ORDER — SUCRALFATE 1 G PO TABS
1.0000 g | ORAL_TABLET | Freq: Two times a day (BID) | ORAL | Status: DC
  Administered 2017-07-18: 22:00:00 1 g via ORAL
  Filled 2017-07-18: qty 1

## 2017-07-18 MED ORDER — ACETAMINOPHEN 325 MG PO TABS: 650.0000 mg | ORAL_TABLET | Freq: Four times a day (QID) | ORAL | Status: DC | PRN

## 2017-07-18 MED ORDER — INFLUENZA VAC SPLIT HIGH-DOSE 0.5 ML IM SUSY
0.5000 mL | PREFILLED_SYRINGE | INTRAMUSCULAR | Status: DC
  Filled 2017-07-18: qty 0.5

## 2017-07-18 MED ORDER — ACETAMINOPHEN 650 MG RE SUPP: 650.0000 mg | Freq: Four times a day (QID) | RECTAL | Status: DC | PRN

## 2017-07-18 NOTE — Consult Note (Signed)
Cephas Darby, MD [address]  [place], [place] [address]  Main: [phone]  Fax: [phone] Pager: [phone]   Consultation  Referring Provider:     No ref. provider found Primary Care Physician:  Venia Carbon, MD Primary Gastroenterologist:  Dr. Tiffany Kocher         Reason for Consultation: Acute on chronic anemia  Date of Admission:  07/18/2017 Date of Consultation:  07/18/2017         HPI:   Kathryn Ware is a 76 y.o. female who is known to Dr Tiffany Kocher in the past , last seen at his clinic in 08/2016 . She has history of chronic iron deficiency anemia secondary to chronic blood loss from small bowel AVMs. She was last admitted to the hospital in 06/2016 at which time she underwent upper endoscopyand a colonoscopy. As found to have duodenal AVMs and they were treated. She was recently admitted less than a week ago secondary to chest pain and found to have troponin leak. Cardiology was consulted who recommended pharmacologic nuclear stress test. Her cardiogram from 04/2017 came back normal. There was no evidence of aortic stenosis. She did not undergo any prior capsule endoscopy study.  Her hemoglobin was 8.3 at the time of discharge On 07/15/2017. For the last couple of days, she has been feeling weak, dizzy upon standing and on exertion. She reports taking oral iron twice daily, and having formed bowel movements. She denies rectal bleeding/hematochezia. Her hemoglobin in the ER was 6. She is hematologically stable. 2 units of PRBCs are ordered. She takes regular aspirin occasionally. Ferritin 30 - 04/2017  GI Procedures:  EGD 03/12/2015 Multiple nonbleeding AVMs in duodenum, s/p cautery EGD 11/23/2015 Multiple nonbleeding AVMs in duodenum, s/p APC EGD 07/07/2016 Multiple nonbleeding AVMs in duodenum, status post APC and clips EGD 12/14/2016 Solitary AVM of the duodenum, treated with APC A. DUODENAL BULB POLYP; COLD BIOPSY:  - NODULAR BRUNNER'S GLAND  HYPERPLASIA AND ACUTE DUODENITIS, SEE NOTE.  - NEGATIVE FOR INTRA-EPITHELIAL LYMPHOCYTOSIS, DYSPLASIA AND MALIGNANCY. EGD 05/19/2017 No AVMs were found  Colonoscopy 12/14/2016 2 small polyps were found in the splenic flexure and descending colon, removed with hot snare. Internal hemorrhoids DIAGNOSIS:   B. COLON POLYP 2, HEPATIC FLEXURE; HOT SNARE:  - TUBULAR ADENOMAS (2).  - NEGATIVE FOR HIGH-GRADE DYSPLASIA AND MALIGNANCY.   Colonoscopy 07/07/2016 Single 15 millimeter polyp in mid ascending colon, removed in piecemeal with hot snare, placed DIAGNOSIS:  COLON POLYP, ASCENDING; HOT SNARE:  - TUBULOVILLOUS ADENOMA.  - NEGATIVE FOR HIGH-GRADE DYSPLASIA AND MALIGNANCY.   Colonoscopy 07/24/2006 Multiple AVMs  Past Medical History:  Diagnosis Date  . Anemia   . ARF (acute renal failure) ([place])   . Blood transfusion without reported diagnosis   . Diabetes mellitus type II   . GI (gastrointestinal bleed)   . GI AVM (gastrointestinal arteriovenous vascular malformation)    stomach  . History of echocardiogram 12/2013   a. 12/2013: EF 60-65%, DD, mild LVH, nl RV size & systolic function, mild MR/TR, mild AS, nl RVSP; b. TTE 04/2017: EF 60-65%, normal wall motion, GR1DD, mild MR, left atrium normal in size, normal RV systolic function, PASP normal   . History of nuclear stress test    a. 12/2013: low risk, no sig WMA, nondiag EKG 2/2 baseline LVH w/ repol abnl, no sig ischemia, EF 70% no artifact  . HLD (hyperlipidemia)   . HTN (hypertension)   . Hypercholesterolemia   . IDA (iron  deficiency anemia)   . Polyp of colon, adenomatous   . RLS (restless legs syndrome)     Past Surgical History:  Procedure Laterality Date  . ABDOMINAL ADHESION SURGERY  11/2001   Dr. Tamala Julian  . ABDOMINAL HYSTERECTOMY  1976   RSO (fibroid)  . COLONOSCOPY WITH PROPOFOL N/A 07/07/2016   Procedure: COLONOSCOPY WITH PROPOFOL;  Surgeon: Manya Silvas, MD;  Location: [facility] ENDOSCOPY;  Service: Endoscopy;   Laterality: N/A;  . COLONOSCOPY WITH PROPOFOL N/A 12/14/2016   Procedure: COLONOSCOPY WITH PROPOFOL;  Surgeon: Manya Silvas, MD;  Location: [facility] ENDOSCOPY;  Service: Endoscopy;  Laterality: N/A;  . DOBUTAMINE STRESS ECHO  11/10   normal  . DOPPLER ECHOCARDIOGRAPHY     EF 55%, mild MR, TR  . ESOPHAGOGASTRODUODENOSCOPY  07/2006   multiple angioectasias  . ESOPHAGOGASTRODUODENOSCOPY Left 03/12/2015   Procedure: place tube in throat and evaluate stomach and duodenum for source of bleeding. Cauterize if concern for rebleeding.;  Surgeon: Hulen Luster, MD;  Location: [facility] ENDOSCOPY;  Service: Endoscopy;  Laterality: Left;  . ESOPHAGOGASTRODUODENOSCOPY N/A 12/14/2016   Procedure: ESOPHAGOGASTRODUODENOSCOPY (EGD);  Surgeon: Manya Silvas, MD;  Location: [facility] ENDOSCOPY;  Service: Endoscopy;  Laterality: N/A;  . ESOPHAGOGASTRODUODENOSCOPY (EGD) WITH PROPOFOL N/A 11/23/2015   Procedure: ESOPHAGOGASTRODUODENOSCOPY (EGD) WITH PROPOFOL;  Surgeon: Hulen Luster, MD;  Location: [facility] ENDOSCOPY;  Service: Gastroenterology;  Laterality: N/A;  . ESOPHAGOGASTRODUODENOSCOPY (EGD) WITH PROPOFOL N/A 07/07/2016   Procedure: ESOPHAGOGASTRODUODENOSCOPY (EGD) WITH PROPOFOL;  Surgeon: Manya Silvas, MD;  Location: [facility] ENDOSCOPY;  Service: Endoscopy;  Laterality: N/A;  . ESOPHAGOGASTRODUODENOSCOPY (EGD) WITH PROPOFOL N/A 05/19/2017   Procedure: ESOPHAGOGASTRODUODENOSCOPY (EGD) WITH PROPOFOL;  Surgeon: Manya Silvas, MD;  Location: [facility] ENDOSCOPY;  Service: Endoscopy;  Laterality: N/A;  . laryngeal polyp  10/2008   Dr. Tami Ribas  . TOP      Prior to Admission medications   Medication Sig Start Date End Date Taking? Authorizing Provider  ferrous sulfate 325 (65 FE) MG tablet Take 325 mg by mouth 2 (two) times daily.    Yes [provider]  lisinopril-hydrochlorothiazide (PRINZIDE,ZESTORETIC) 20-12.5 MG tablet Take 1 tablet by mouth daily. 07/20/16  Yes Venia Carbon, MD  metFORMIN (GLUCOPHAGE-XR) 500 MG 24 hr  tablet Take 500 mg by mouth every evening.    Yes [provider]  omeprazole (PRILOSEC) 40 MG capsule Take 40 mg by mouth 2 (two) times daily as needed. For heartburn   Yes [provider]  simvastatin (ZOCOR) 80 MG tablet Take 80 mg by mouth at bedtime.   Yes [provider]  sucralfate (CARAFATE) 1 g tablet Take 1 g by mouth 4 (four) times daily as needed. For indigestion   Yes [provider]  vitamin B-12 (CYANOCOBALAMIN) 1000 MCG tablet Take 1,000 mcg by mouth daily.   Yes [provider]  clonazePAM (KLONOPIN) 0.5 MG tablet Take 1 tablet (0.5 mg total) by mouth daily as needed. Patient taking differently: Take 0.5 mg by mouth daily as needed for anxiety.  08/03/16   Venia Carbon, MD  ONE TOUCH ULTRA TEST test strip TEST AS DIRECTED ONCE DAILY 07/04/17   Venia Carbon, MD    Family History  Problem Relation Age of Onset  . Stroke Father   . Cancer Sister        breast cancer  . Hypertension Mother   . Hypertension Unknown        sibling  . Diabetes Unknown  grandmother     Social History  Substance Use Topics  . Smoking status: Current Every Day Smoker    Packs/day: 1.00    Types: Cigarettes  . Smokeless tobacco: Never Used  . Alcohol use No    Allergies as of 07/18/2017 - Review Complete 07/18/2017  Allergen Reaction Noted  . Metformin  09/05/2006  . Nifedipine  09/05/2006    Review of Systems:    All systems reviewed and negative except where noted in HPI.   Physical Exam:  Vital signs in last 24 hours: Temp:  [98.5 F (36.9 C)-98.7 F (37.1 C)] 98.5 F (36.9 C) (09/25 1810) Pulse Rate:  [83-117] 88 (09/25 1810) Resp:  [16-27] 16 (09/25 1810) BP: (103-136)/(46-56) 116/50 (09/25 1810) SpO2:  [96 %-100 %] 100 % (09/25 1810) Weight:  [113 lb 12.8 oz (51.6 kg)-115 lb (52.2 kg)] 113 lb 12.8 oz (51.6 kg) (09/25 1810)   General:   Pleasant, cooperative in NAD, pale appearing Head:  Normocephalic and  atraumatic. Eyes:   No icterus.   Conjunctiva pale. PERRLA. Ears:  Normal auditory acuity. Neck:  Supple; no masses or thyroidomegaly Lungs: Respirations even and unlabored. Lungs clear to auscultation bilaterally.   No wheezes, crackles, or rhonchi.  Heart:  Regular rate and rhythm; soft systolic murmur, clicks, rubs or gallops Abdomen:  Soft, nondistended, nontender. Normal bowel sounds. No appreciable masses or hepatomegaly.  No rebound or guarding.  Rectal:  Not performed. Msk:  Symmetrical without gross deformities. Extremities:  Without edema, cyanosis or clubbing. Neurologic:  Alert and oriented x3;  grossly normal neurologically. Skin:  Intact without significant lesions or rashes. Cervical Nodes:  No significant cervical adenopathy. Psych:  Alert and cooperative. Normal affect.  LAB RESULTS:  Recent Labs  07/18/17 1355  WBC 8.0  HGB 6.0*  HCT 18.2*  PLT 221   CBC Latest Ref Rng & Units 07/18/2017 07/15/2017 07/14/2017  WBC 3.6 - 11.0 K/uL 8.0 4.6 4.9  Hemoglobin 12.0 - 16.0 g/dL 6.0(L) 8.3(L) 8.0(L)  Hematocrit 35.0 - 47.0 % 18.2(L) 24.8(L) 24.2(L)  Platelets 150 - 440 K/uL 221 215 206   BMET  Recent Labs  07/18/17 1355  NA 139  K 3.7  CL 108  CO2 22  GLUCOSE 146*  BUN 30*  CREATININE 1.08*  CALCIUM 9.3   LFT No results for input(s): PROT, ALBUMIN, AST, ALT, ALKPHOS, BILITOT, BILIDIR, IBILI in the last 72 hours. PT/INR No results for input(s): LABPROT, INR in the last 72 hours.  STUDIES: Dg Chest Portable 1 View  Result Date: 07/18/2017 CLINICAL DATA:  Cough and low-grade fever EXAM: PORTABLE CHEST 1 VIEW COMPARISON:  07/14/2017 FINDINGS: Cardiac shadow is within normal limits. Aortic calcifications are seen. The lungs are well aerated bilaterally. No focal infiltrate or sizable effusion is seen. Biapical scarring is seen. No acute bony abnormality is noted. IMPRESSION: No active disease. Electronically Signed   By: Inez Catalina M.D.   On: 07/18/2017 15:07        Impression / Plan:   ANDREKA STUCKI is a 76 y.o. black female with chronic iron deficiency anemia secondary to chronic blood loss from small bowel AVMs, multiple upper endoscopies with treatment of AVMs. She presented with acute on chronic symptomatic anemia. She is oral iron and having black formed stools. I cannot definitely say if she is having active GI bleed.   -Recommend upper endoscopy after adequate resuscitation, tentatively tomorrow.  -Goal hemoglobin >8.  -Check ferritin -Stop oral iron while inpatient -Okay for  clear liquid diet.  -Nothing by mouth past midnight  -If EGD is unremarkable, proceed with VCE.  -If VCE reveals small bowel AVMs, discuss about trial of octreotide.   Thank you for involving me in the care of this patient.      LOS: 0 days   Sherri Sear, MD  07/18/2017, 7:01 PM   Note: This dictation was prepared with Dragon dictation along with smaller phrase technology. Any transcriptional errors that result from this process are unintentional.

## 2017-07-18 NOTE — ED Notes (Signed)
Per Dr. Jacqualine Code blood may be administered once pt is admitted to floor.

## 2017-07-18 NOTE — Discharge Summary (Signed)
Kathryn Ware at [facility] NAME: Kathryn Ware    MR#:  [id number]  DATE OF BIRTH:  29-Sep-1941  DATE OF ADMISSION:  07/14/2017 ADMITTING PHYSICIAN: Lance Coon, MD  DATE OF DISCHARGE: 07/15/2017  2:46 PM  PRIMARY CARE PHYSICIAN: Venia Carbon, MD    ADMISSION DIAGNOSIS:  Troponin I above reference range [R74.8] Dyspnea, unspecified type [R06.00]  DISCHARGE DIAGNOSIS:  Principal Problem:   Exertional chest pain Active Problems:   Diabetes mellitus with complication (HCC)   HYPERCHOLESTEROLEMIA   Essential hypertension   Restless legs syndrome (RLS)   Chronic diastolic CHF (congestive heart failure) (HCC)   Chest pain   SECONDARY DIAGNOSIS:   Past Medical History:  Diagnosis Date  . Anemia   . ARF (acute renal failure) ([place])   . Blood transfusion without reported diagnosis   . Diabetes mellitus type II   . GI (gastrointestinal bleed)   . GI AVM (gastrointestinal arteriovenous vascular malformation)    stomach  . History of echocardiogram 12/2013   a. 12/2013: EF 60-65%, DD, mild LVH, nl RV size & systolic function, mild MR/TR, mild AS, nl RVSP; b. TTE 04/2017: EF 60-65%, normal wall motion, GR1DD, mild MR, left atrium normal in size, normal RV systolic function, PASP normal   . History of nuclear stress test    a. 12/2013: low risk, no sig WMA, nondiag EKG 2/2 baseline LVH w/ repol abnl, no sig ischemia, EF 70% no artifact  . HLD (hyperlipidemia)   . HTN (hypertension)   . Hypercholesterolemia   . IDA (iron deficiency anemia)   . Polyp of colon, adenomatous   . RLS (restless legs syndrome)     HOSPITAL COURSE:   Exertional chest pain - trend cardiac enzymes, get an echocardiogram and cardiology consult   Cardiologist suggest stress test as out pt, as pt was feeling fine.    Diabetes mellitus with complication (HCC) - sliding scale insulin with corresponding glucose checks   Essential hypertension - continue home meds   Chronic diastolic CHF (congestive heart failure) (HCC) - stable, continue home meds   HYPERCHOLESTEROLEMIA - home dose antilipid   Restless legs syndrome (RLS) - continue home meds   Anemia- Need to follow in Out pt hematology clinic. DISCHARGE CONDITIONS:   Stable.  CONSULTS OBTAINED:  Treatment Team:  Wellington Hampshire, MD  DRUG ALLERGIES:   Allergies  Allergen Reactions  . Metformin     REACTION: diarrhea when dosage is increased, patient aware that she is onmetformin when it is listed that she is allergic  . Nifedipine     REACTION: cough    DISCHARGE MEDICATIONS:   Discharge Medication List as of 07/15/2017  2:31 PM    CONTINUE these medications which have NOT CHANGED   Details  clonazePAM (KLONOPIN) 0.5 MG tablet Take 1 tablet (0.5 mg total) by mouth daily as needed., Starting Wed 08/03/2016, Phone In    ferrous sulfate 325 (65 FE) MG tablet Take 325 mg by mouth 2 (two) times daily. , Historical Med    lisinopril-hydrochlorothiazide (PRINZIDE,ZESTORETIC) 20-12.5 MG tablet Take 1 tablet by mouth daily., Starting Wed 07/20/2016, No Print    metFORMIN (GLUCOPHAGE-XR) 500 MG 24 hr tablet Take 500 mg by mouth every evening. , Historical Med    omeprazole (PRILOSEC) 40 MG capsule Take 40 mg by mouth 2 (two) times daily as needed. For heartburn, Historical Med    ONE TOUCH ULTRA TEST test strip TEST AS DIRECTED ONCE DAILY,  Normal    simvastatin (ZOCOR) 80 MG tablet Take 80 mg by mouth at bedtime., Historical Med    sucralfate (CARAFATE) 1 g tablet Take 1 g by mouth 4 (four) times daily as needed. For indigestion, Historical Med    vitamin B-12 (CYANOCOBALAMIN) 1000 MCG tablet Take 1,000 mcg by mouth daily., Historical Med         DISCHARGE INSTRUCTIONS:    Follow with cardiology and oncology clinic.  If you experience worsening of your admission symptoms, develop shortness of breath, life threatening emergency, suicidal or homicidal thoughts you must seek medical  attention immediately by calling 911 or calling your MD immediately  if symptoms less severe.  You Must read complete instructions/literature along with all the possible adverse reactions/side effects for all the Medicines you take and that have been prescribed to you. Take any new Medicines after you have completely understood and accept all the possible adverse reactions/side effects.   Please note  You were cared for by a hospitalist during your hospital stay. If you have any questions about your discharge medications or the care you received while you were in the hospital after you are discharged, you can call the unit and asked to speak with the hospitalist on call if the hospitalist that took care of you is not available. Once you are discharged, your primary care physician will handle any further medical issues. Please note that NO REFILLS for any discharge medications will be authorized once you are discharged, as it is imperative that you return to your primary care physician (or establish a relationship with a primary care physician if you do not have one) for your aftercare needs so that they can reassess your need for medications and monitor your lab values.    Today   CHIEF COMPLAINT:   Chief Complaint  Patient presents with  . Weakness  . Dizziness    HISTORY OF PRESENT ILLNESS:  Kathryn Ware  is a 76 y.o. female presents with Exertional chest pain and dyspnea on exertion for the past couple of weeks, getting progressively worse. Patient's troponin was mildly elevated in the ED here today. Hospitalists were called for admission and further evaluation   VITAL SIGNS:  Blood pressure (!) 118/45, pulse 66, temperature 98.9 F (37.2 C), temperature source Oral, resp. rate 18, height 5\' 4"  (1.626 m), weight 49.9 kg (110 lb), SpO2 100 %.  I/O:  No intake or output data in the 24 hours ending 07/18/17 0924  PHYSICAL EXAMINATION:  GENERAL:  76 y.o.-year-old patient lying in the  bed with no acute distress.  EYES: Pupils equal, round, reactive to light and accommodation. No scleral icterus. Extraocular muscles intact.  HEENT: Head atraumatic, normocephalic. Oropharynx and nasopharynx clear.  NECK:  Supple, no jugular venous distention. No thyroid enlargement, no tenderness.  LUNGS: Normal breath sounds bilaterally, no wheezing, rales,rhonchi or crepitation. No use of accessory muscles of respiration.  CARDIOVASCULAR: S1, S2 normal. No murmurs, rubs, or gallops.  ABDOMEN: Soft, non-tender, non-distended. Bowel sounds present. No organomegaly or mass.  EXTREMITIES: No pedal edema, cyanosis, or clubbing.  NEUROLOGIC: Cranial nerves II through XII are intact. Muscle strength 5/5 in all extremities. Sensation intact. Gait not checked.  PSYCHIATRIC: The patient is alert and oriented x 3.  SKIN: No obvious rash, lesion, or ulcer.   DATA REVIEW:   CBC  Recent Labs Lab 07/15/17 0607  WBC 4.6  HGB 8.3*  HCT 24.8*  PLT 215    Chemistries   Recent Labs  Lab 07/15/17 0607  NA 140  K 4.1  CL 110  CO2 23  GLUCOSE 99  BUN 24*  CREATININE 0.94  CALCIUM 9.4    Cardiac Enzymes  Recent Labs Lab 07/15/17 1207  TROPONINI 0.06*    Microbiology Results  No results found for this or any previous visit.  RADIOLOGY:  No results found.  EKG:   Orders placed or performed during the hospital encounter of 07/14/17  . EKG 12-Lead  . EKG 12-Lead  . ED EKG within 10 minutes  . ED EKG within 10 minutes      Management plans discussed with the patient, family and they are in agreement.  CODE STATUS:  Code Status History    Date Active Date Inactive Code Status Order ID Comments User Context   07/14/2017 10:48 PM 07/15/2017  5:52 PM Full Code [id number]  Lance Coon, MD Inpatient   05/10/2017  8:00 PM 05/12/2017  2:04 PM Full Code [id number]  Vaughan Basta, MD Inpatient   07/05/2016  1:01 PM 07/08/2016  2:02 PM Full Code [id number]  Fritzi Mandes, MD  Inpatient   10/13/2015  8:53 PM 10/14/2015  4:36 PM Full Code [id number]  Dustin Flock, MD Inpatient   03/10/2015  9:31 PM 03/13/2015  1:11 PM Full Code [id number]  Theodoro Grist, MD Inpatient      TOTAL TIME TAKING CARE OF THIS PATIENT: 35 minutes.    Vaughan Basta M.D on 07/18/2017 at 9:24 AM  Between 7am to 6pm - Pager - [phone]  After 6pm go to [email or web address] - password [email or web address] [facility]  Office  [phone]  CC: Primary care physician; Venia Carbon, MD   Note: This dictation was prepared with Dragon dictation along with smaller phrase technology. Any transcriptional errors that result from this process are unintentional.

## 2017-07-18 NOTE — ED Triage Notes (Signed)
Pt arrived via EMS from home for reports of generalized weakness, dizziness and chest pain only when she stands. EMS reports sinus tach on the monitor, 97/58, CBG 221, 100% RA.

## 2017-07-18 NOTE — ED Notes (Signed)
Assisted pt to toilet to urinate. Pt with steady gait. Urinated into toilet without difficulty.

## 2017-07-18 NOTE — H&P (Signed)
Kathryn Ware is an 76 y.o. female.   Chief Complaint: Weakness HPI: This is 76 year old female who was discharged from the hospital 3 days ago following an admission for chest pain. Workup then revealed this was demand ischemia from her anemia. She does have a history of bleeding AVMs and hurt a lot numb. Her hemoglobin at discharge was 8.3. For the past couple days she's been feeling weak and gets dizzy when she stands up to do anything. She has not noted any dark stools or blood. However here in the ER hemoglobin is now 6. Hospitalist services were consulted for admission.  Past Medical History:  Diagnosis Date  . Anemia   . ARF (acute renal failure) ([place])   . Blood transfusion without reported diagnosis   . Diabetes mellitus type II   . GI (gastrointestinal bleed)   . GI AVM (gastrointestinal arteriovenous vascular malformation)    stomach  . History of echocardiogram 12/2013   a. 12/2013: EF 60-65%, DD, mild LVH, nl RV size & systolic function, mild MR/TR, mild AS, nl RVSP; b. TTE 04/2017: EF 60-65%, normal wall motion, GR1DD, mild MR, left atrium normal in size, normal RV systolic function, PASP normal   . History of nuclear stress test    a. 12/2013: low risk, no sig WMA, nondiag EKG 2/2 baseline LVH w/ repol abnl, no sig ischemia, EF 70% no artifact  . HLD (hyperlipidemia)   . HTN (hypertension)   . Hypercholesterolemia   . IDA (iron deficiency anemia)   . Polyp of colon, adenomatous   . RLS (restless legs syndrome)     Past Surgical History:  Procedure Laterality Date  . ABDOMINAL ADHESION SURGERY  11/2001   Dr. Tamala Julian  . ABDOMINAL HYSTERECTOMY  1976   RSO (fibroid)  . COLONOSCOPY WITH PROPOFOL N/A 07/07/2016   Procedure: COLONOSCOPY WITH PROPOFOL;  Surgeon: Manya Silvas, MD;  Location: [facility] ENDOSCOPY;  Service: Endoscopy;  Laterality: N/A;  . COLONOSCOPY WITH PROPOFOL N/A 12/14/2016   Procedure: COLONOSCOPY WITH PROPOFOL;  Surgeon: Manya Silvas, MD;  Location: [facility]  ENDOSCOPY;  Service: Endoscopy;  Laterality: N/A;  . DOBUTAMINE STRESS ECHO  11/10   normal  . DOPPLER ECHOCARDIOGRAPHY     EF 55%, mild MR, TR  . ESOPHAGOGASTRODUODENOSCOPY  07/2006   multiple angioectasias  . ESOPHAGOGASTRODUODENOSCOPY Left 03/12/2015   Procedure: place tube in throat and evaluate stomach and duodenum for source of bleeding. Cauterize if concern for rebleeding.;  Surgeon: Hulen Luster, MD;  Location: [facility] ENDOSCOPY;  Service: Endoscopy;  Laterality: Left;  . ESOPHAGOGASTRODUODENOSCOPY N/A 12/14/2016   Procedure: ESOPHAGOGASTRODUODENOSCOPY (EGD);  Surgeon: Manya Silvas, MD;  Location: [facility] ENDOSCOPY;  Service: Endoscopy;  Laterality: N/A;  . ESOPHAGOGASTRODUODENOSCOPY (EGD) WITH PROPOFOL N/A 11/23/2015   Procedure: ESOPHAGOGASTRODUODENOSCOPY (EGD) WITH PROPOFOL;  Surgeon: Hulen Luster, MD;  Location: [facility] ENDOSCOPY;  Service: Gastroenterology;  Laterality: N/A;  . ESOPHAGOGASTRODUODENOSCOPY (EGD) WITH PROPOFOL N/A 07/07/2016   Procedure: ESOPHAGOGASTRODUODENOSCOPY (EGD) WITH PROPOFOL;  Surgeon: Manya Silvas, MD;  Location: [facility] ENDOSCOPY;  Service: Endoscopy;  Laterality: N/A;  . ESOPHAGOGASTRODUODENOSCOPY (EGD) WITH PROPOFOL N/A 05/19/2017   Procedure: ESOPHAGOGASTRODUODENOSCOPY (EGD) WITH PROPOFOL;  Surgeon: Manya Silvas, MD;  Location: [facility] ENDOSCOPY;  Service: Endoscopy;  Laterality: N/A;  . laryngeal polyp  10/2008   Dr. Tami Ribas  . TOP      Family History  Problem Relation Age of Onset  . Stroke Father   . Cancer Sister        breast  cancer  . Hypertension Mother   . Hypertension Unknown        sibling  . Diabetes Unknown        grandmother   Social History:  reports that she has been smoking Cigarettes.  She has been smoking about 1.00 pack per day. She has never used smokeless tobacco. She reports that she does not drink alcohol or use drugs.  Allergies:  Allergies  Allergen Reactions  . Metformin     REACTION: diarrhea when dosage is increased,  patient aware that she is onmetformin when it is listed that she is allergic  . Nifedipine     REACTION: cough     (Not in a hospital admission)  Results for orders placed or performed during the hospital encounter of 07/18/17 (from the past 48 hour(s))  Basic metabolic panel     Status: Abnormal   Collection Time: 07/18/17  1:55 PM  Result Value Ref Range   Sodium 139 135 - 145 mmol/L   Potassium 3.7 3.5 - 5.1 mmol/L   Chloride 108 101 - 111 mmol/L   CO2 22 22 - 32 mmol/L   Glucose, Bld 146 (H) 65 - 99 mg/dL   BUN 30 (H) 6 - 20 mg/dL   Creatinine, Ser 1.08 (H) 0.44 - 1.00 mg/dL   Calcium 9.3 8.9 - 10.3 mg/dL   GFR calc non Af Amer 49 (L) >60 mL/min   GFR calc Af Amer 57 (L) >60 mL/min    Comment: (NOTE) The eGFR has been calculated using the CKD EPI equation. This calculation has not been validated in all clinical situations. eGFR's persistently <60 mL/min signify possible Chronic Kidney Disease.    Anion gap 9 5 - 15  CBC     Status: Abnormal   Collection Time: 07/18/17  1:55 PM  Result Value Ref Range   WBC 8.0 3.6 - 11.0 K/uL   RBC 2.11 (L) 3.80 - 5.20 MIL/uL   Hemoglobin 6.0 (L) 12.0 - 16.0 g/dL   HCT 18.2 (L) 35.0 - 47.0 %   MCV 86.0 80.0 - 100.0 fL   MCH 28.6 26.0 - 34.0 pg   MCHC 33.2 32.0 - 36.0 g/dL   RDW 14.0 11.5 - 14.5 %   Platelets 221 150 - 440 K/uL  Urinalysis, Complete w Microscopic     Status: Abnormal   Collection Time: 07/18/17  1:56 PM  Result Value Ref Range   Color, Urine YELLOW (A) YELLOW   APPearance CLEAR (A) CLEAR   Specific Gravity, Urine 1.015 1.005 - 1.030   pH 5.0 5.0 - 8.0   Glucose, UA NEGATIVE NEGATIVE mg/dL   Hgb urine dipstick NEGATIVE NEGATIVE   Bilirubin Urine NEGATIVE NEGATIVE   Ketones, ur NEGATIVE NEGATIVE mg/dL   Protein, ur NEGATIVE NEGATIVE mg/dL   Nitrite NEGATIVE NEGATIVE   Leukocytes, UA NEGATIVE NEGATIVE   RBC / HPF NONE SEEN 0 - 5 RBC/hpf   WBC, UA NONE SEEN 0 - 5 WBC/hpf   Bacteria, UA NONE SEEN NONE SEEN    Squamous Epithelial / LPF 0-5 (A) NONE SEEN  Type and screen [facility]     Status: None (Preliminary result)   Collection Time: 07/18/17  2:59 PM  Result Value Ref Range   ABO/RH(D) PENDING    Antibody Screen PENDING    Sample Expiration 07/21/2017    Dg Chest Portable 1 View  Result Date: 07/18/2017 CLINICAL DATA:  Cough and low-grade fever EXAM: PORTABLE CHEST 1 VIEW COMPARISON:  07/14/2017 FINDINGS: Cardiac shadow is within normal limits. Aortic calcifications are seen. The lungs are well aerated bilaterally. No focal infiltrate or sizable effusion is seen. Biapical scarring is seen. No acute bony abnormality is noted. IMPRESSION: No active disease. Electronically Signed   By: Inez Catalina M.D.   On: 07/18/2017 15:07    Review of Systems  Constitutional: Negative for fever.  HENT: Negative for hearing loss.   Eyes: Negative for blurred vision.  Respiratory: Negative for cough.   Cardiovascular: Negative for chest pain.  Gastrointestinal: Negative for nausea and vomiting.  Genitourinary: Negative for dysuria.  Musculoskeletal: Negative for joint pain.  Skin: Negative for rash.  Neurological: Positive for dizziness.    Blood pressure (!) 103/56, pulse (!) 117, temperature 98.7 F (37.1 C), temperature source Oral, resp. rate (!) 21, height '5\' 5"'  (1.651 m), weight 52.2 kg (115 lb), SpO2 98 %. Physical Exam  Constitutional: She is oriented to person, place, and time. She appears well-developed and well-nourished. No distress.  HENT:  Head: Normocephalic.  Mouth/Throat: Oropharynx is clear and moist. No oropharyngeal exudate.  Eyes: Pupils are equal, round, and reactive to light. EOM are normal. No scleral icterus.  Neck: Normal range of motion. Neck supple. No JVD present. No tracheal deviation present. No thyromegaly present.  Cardiovascular: Normal rate and regular rhythm.   No murmur heard. Respiratory: Effort normal and breath sounds normal. No  respiratory distress. She exhibits no tenderness.  GI: Soft. Bowel sounds are normal. She exhibits no distension and no mass. There is no tenderness. There is no rebound.  Musculoskeletal: Normal range of motion. She exhibits no edema or tenderness.  Lymphadenopathy:    She has no cervical adenopathy.  Neurological: She is alert and oriented to person, place, and time. No cranial nerve deficit. Coordination normal.  Skin: Skin is warm and dry. No erythema.     Assessment/Plan 1. GI bleeding. Likely source is her duodenal AVMs. She is sulcrafate and Protonix. Will continue these. We'll consult GI for any further workup. We'll transfuse blood. If hemoglobin does not stabilize after transfusion may need bleeding scan and vascular surgery consult. 2. Acute on chronic blood loss anemia. We'll go ahead and transfuse 2 units packed red blood cells. Recheck hemoglobin posttransfusion. Consider letting her with IV iron. Continue current iron supplemental. 3. Diabetes. We'll go ahead and start sliding scale insulin and hold her Glucophage. Next line 4. Hypertension. We'll continue current medications. Next line Total time spent 45 minutes  Baxter Hire, MD 07/18/2017, 3:45 PM

## 2017-07-18 NOTE — ED Provider Notes (Signed)
[facility] Emergency Department Provider Note  ____________________________________________   First MD Initiated Contact with Patient 07/18/17 1358     (approximate)  I have reviewed the triage vital signs and the nursing notes.   HISTORY  Chief Complaint Weakness   HPI Kathryn Ware is a 76 y.o. female recently discharged from the hospital for chest discomfort, also history of recent upper GI bleeding    patient presents today reports that throughout the day today whenever she stands up she is felt lightheaded. EMS noted that she was tachycardic, a low-grade temperature about 100 Fahrenheit, normal saturation. Denies being in pain. She reports that she's had one dark stool daily for several days. She continues to "think I might still have blood in my stool"  no abdominal pain. No headache. No fevers or chills reported by the patient. Does report lightheadedness especially with standing. Denies taking any blood thinners   Past Medical History:  Diagnosis Date  . Anemia   . ARF (acute renal failure) ([place])   . Blood transfusion without reported diagnosis   . Diabetes mellitus type II   . GI (gastrointestinal bleed)   . GI AVM (gastrointestinal arteriovenous vascular malformation)    stomach  . History of echocardiogram 12/2013   a. 12/2013: EF 60-65%, DD, mild LVH, nl RV size & systolic function, mild MR/TR, mild AS, nl RVSP; b. TTE 04/2017: EF 60-65%, normal wall motion, GR1DD, mild MR, left atrium normal in size, normal RV systolic function, PASP normal   . History of nuclear stress test    a. 12/2013: low risk, no sig WMA, nondiag EKG 2/2 baseline LVH w/ repol abnl, no sig ischemia, EF 70% no artifact  . HLD (hyperlipidemia)   . HTN (hypertension)   . Hypercholesterolemia   . IDA (iron deficiency anemia)   . Polyp of colon, adenomatous   . RLS (restless legs syndrome)     Patient Active Problem List   Diagnosis Date Noted  . Exertional chest  pain 07/14/2017  . Dyspnea on exertion 07/14/2017  . Chest pain 07/14/2017  . Chronic diastolic CHF (congestive heart failure) ([place]) 05/31/2017  . Abnormal EKG 05/11/2017  . Symptomatic anemia 05/10/2017  . Malnutrition of mild degree ([place]) 03/30/2017  . Anemia 07/05/2016  . Restless legs syndrome (RLS) 02/23/2016  . 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 Anemia due to gastrointestinal blood loss 02/18/2015  . Advance directive discussed with patient 02/18/2015  . Atypical chest pain 01/14/2014  . Nicotine dependence 10/29/2013  . Routine general medical examination at a health care facility 08/17/2011  . Diabetes mellitus with complication ([place]) 33/35/4562  . HYPERCHOLESTEROLEMIA 02/02/2007  . Essential hypertension 02/02/2007  . Angiodysplasia of intestinal tract 02/02/2007    Past Surgical History:  Procedure Laterality Date  . ABDOMINAL ADHESION SURGERY  11/2001   Dr. Tamala Julian  . ABDOMINAL HYSTERECTOMY  1976   RSO (fibroid)  . COLONOSCOPY WITH PROPOFOL N/A 07/07/2016   Procedure: COLONOSCOPY WITH PROPOFOL;  Surgeon: Manya Silvas, MD;  Location: [facility] ENDOSCOPY;  Service: Endoscopy;  Laterality: N/A;  . COLONOSCOPY WITH PROPOFOL N/A 12/14/2016   Procedure: COLONOSCOPY WITH PROPOFOL;  Surgeon: Manya Silvas, MD;  Location: [facility] ENDOSCOPY;  Service: Endoscopy;  Laterality: N/A;  . DOBUTAMINE STRESS ECHO  11/10   normal  . DOPPLER ECHOCARDIOGRAPHY     EF 55%, mild MR, TR  . ESOPHAGOGASTRODUODENOSCOPY  07/2006   multiple angioectasias  . ESOPHAGOGASTRODUODENOSCOPY Left 03/12/2015   Procedure: place tube in throat  and evaluate stomach and duodenum for source of bleeding. Cauterize if concern for rebleeding.;  Surgeon: Hulen Luster, MD;  Location: [facility] ENDOSCOPY;  Service: Endoscopy;  Laterality: Left;  . ESOPHAGOGASTRODUODENOSCOPY N/A 12/14/2016   Procedure: ESOPHAGOGASTRODUODENOSCOPY (EGD);  Surgeon: Manya Silvas, MD;  Location: [facility] ENDOSCOPY;  Service: Endoscopy;   Laterality: N/A;  . ESOPHAGOGASTRODUODENOSCOPY (EGD) WITH PROPOFOL N/A 11/23/2015   Procedure: ESOPHAGOGASTRODUODENOSCOPY (EGD) WITH PROPOFOL;  Surgeon: Hulen Luster, MD;  Location: [facility] ENDOSCOPY;  Service: Gastroenterology;  Laterality: N/A;  . ESOPHAGOGASTRODUODENOSCOPY (EGD) WITH PROPOFOL N/A 07/07/2016   Procedure: ESOPHAGOGASTRODUODENOSCOPY (EGD) WITH PROPOFOL;  Surgeon: Manya Silvas, MD;  Location: [facility] ENDOSCOPY;  Service: Endoscopy;  Laterality: N/A;  . ESOPHAGOGASTRODUODENOSCOPY (EGD) WITH PROPOFOL N/A 05/19/2017   Procedure: ESOPHAGOGASTRODUODENOSCOPY (EGD) WITH PROPOFOL;  Surgeon: Manya Silvas, MD;  Location: [facility] ENDOSCOPY;  Service: Endoscopy;  Laterality: N/A;  . laryngeal polyp  10/2008   Dr. Tami Ribas  . TOP      Prior to Admission medications   Medication Sig Start Date End Date Taking? Authorizing Provider  ferrous sulfate 325 (65 FE) MG tablet Take 325 mg by mouth 2 (two) times daily.    Yes [provider]  lisinopril-hydrochlorothiazide (PRINZIDE,ZESTORETIC) 20-12.5 MG tablet Take 1 tablet by mouth daily. 07/20/16  Yes Venia Carbon, MD  metFORMIN (GLUCOPHAGE-XR) 500 MG 24 hr tablet Take 500 mg by mouth every evening.    Yes [provider]  omeprazole (PRILOSEC) 40 MG capsule Take 40 mg by mouth 2 (two) times daily as needed. For heartburn   Yes [provider]  simvastatin (ZOCOR) 80 MG tablet Take 80 mg by mouth at bedtime.   Yes [provider]  sucralfate (CARAFATE) 1 g tablet Take 1 g by mouth 4 (four) times daily as needed. For indigestion   Yes [provider]  vitamin B-12 (CYANOCOBALAMIN) 1000 MCG tablet Take 1,000 mcg by mouth daily.   Yes [provider]  clonazePAM (KLONOPIN) 0.5 MG tablet Take 1 tablet (0.5 mg total) by mouth daily as needed. Patient taking differently: Take 0.5 mg by mouth daily as needed for anxiety.  08/03/16   Venia Carbon, MD  ONE TOUCH ULTRA TEST test strip TEST AS DIRECTED  ONCE DAILY 07/04/17   Venia Carbon, MD    Allergies Metformin and Nifedipine  Family History  Problem Relation Age of Onset  . Stroke Father   . Cancer Sister        breast cancer  . Hypertension Mother   . Hypertension Unknown        sibling  . Diabetes Unknown        grandmother    Social History Social History  Substance Use Topics  . Smoking status: Current Every Day Smoker    Packs/day: 1.00    Types: Cigarettes  . Smokeless tobacco: Never Used  . Alcohol use No    Review of Systems Constitutional: No fever/chills Eyes: No visual changes. ENT: No sore throat. Cardiovascular: Denies chest pain. Respiratory: Denies shortness of breath.does report occasional cough for 2 weeks. Nonproductive Gastrointestinal: No abdominal pain.  No nausea, no vomiting.  No diarrhea.  No constipation. Genitourinary: Negative for dysuria. Musculoskeletal: Negative for back pain. Skin: Negative for rash. Neurological: Negative for headaches, focal weakness or numbness.    ____________________________________________   PHYSICAL EXAM:  VITAL SIGNS: ED Triage Vitals  Enc Vitals Group     BP 07/18/17 1357 (!) 103/56     Pulse Rate 07/18/17  1357 (!) 117     Resp 07/18/17 1357 (!) 21     Temp 07/18/17 1357 98.7 F (37.1 C)     Temp Source 07/18/17 1357 Oral     SpO2 07/18/17 1350 100 %     Weight 07/18/17 1353 115 lb (52.2 kg)     Height 07/18/17 1353 5\' 5"  (1.651 m)     Head Circumference --      Peak Flow --      Pain Score --      Pain Loc --      Pain Edu? --      Excl. in [place]? --     Constitutional: Alert and oriented. Well appearing and in no acute distress. Eyes: Conjunctivae are normal. Head: Atraumatic. Nose: No congestion/rhinnorhea. Mouth/Throat: Mucous membranes are moist. Neck: No stridor.   Cardiovascular: slightly tachycardic rate, regular rhythm. Grossly normal heart sounds.  Good peripheral circulation. Respiratory: Normal respiratory effort.  No  retractions. Lungs CTAB.occasional dry cough. Gastrointestinal: Soft and nontender. No distention. Musculoskeletal: No lower extremity tenderness nor edema. Neurologic:  Normal speech and language. No gross focal neurologic deficits are appreciated.  Skin:  Skin is warm, dry and intact. No rash noted. Psychiatric: Mood and affect are normal. Speech and behavior are normal.  ____________________________________________   LABS (all labs ordered are listed, but only abnormal results are displayed)  Labs Reviewed  BASIC METABOLIC PANEL - Abnormal; Notable for the following:       Result Value   Glucose, Bld 146 (*)    BUN 30 (*)    Creatinine, Ser 1.08 (*)    GFR calc non Af Amer 49 (*)    GFR calc Af Amer 57 (*)    All other components within normal limits  CBC - Abnormal; Notable for the following:    RBC 2.11 (*)    Hemoglobin 6.0 (*)    HCT 18.2 (*)    All other components within normal limits  URINALYSIS, COMPLETE (UACMP) WITH MICROSCOPIC  TROPONIN I  TYPE AND SCREEN   ____________________________________________  EKG  reviewed and interpreted by me at 1400 Heart rate 1:15 QRS 100 QTc 4:30 Normal sinus tachycardia, T-wave abnormality noted laterally, possibly secondary to LVH versus felt less likely ischemic change ____________________________________________  RADIOLOGY  Dg Chest Portable 1 View  Result Date: 07/18/2017 CLINICAL DATA:  Cough and low-grade fever EXAM: PORTABLE CHEST 1 VIEW COMPARISON:  07/14/2017 FINDINGS: Cardiac shadow is within normal limits. Aortic calcifications are seen. The lungs are well aerated bilaterally. No focal infiltrate or sizable effusion is seen. Biapical scarring is seen. No acute bony abnormality is noted. IMPRESSION: No active disease. Electronically Signed   By: Inez Catalina M.D.   On: 07/18/2017 15:07    ____________________________________________   PROCEDURES  Procedure(s) performed: None  Procedures  Critical Care  performed: No  ____________________________________________   INITIAL IMPRESSION / ASSESSMENT AND PLAN / ED COURSE  Pertinent labs & imaging results that were available during my care of the patient were reviewed by me and considered in my medical decision making (see chart for details).    Clinical Course as of Jul 18 1520  Tue Jul 18, 2017  1454 DG Chest Portable 1 View [MQ]    Clinical Course User Index [MQ] Delman Kitten, MD   patient presents for symptoms of lightheadedness primarily with sitting or standing. On labs she is noted to have reduced hemoglobin, appears to be acute change associated with ongoing dark stools. I suspect  likely ongoing GI bleeding. She is not any anticoagulants. In addition, EMS had noted a low-grade fever however afebrile here but is slightly tachycardic in reporting a cough. Chest x-ray does not appear to demonstrate acute disease on my interpretation  ----------------------------------------- 3:23 PM on 07/18/2017 -----------------------------------------  Patient verbally and signed consent for blood work. I personally reviewed risks and benefits with her. She does not meet need for emergent transfusion, but discussed with the hospitalist they will admit her plan to transfuse her as well as monitor her closely for other concerns and potential signs of infection, though at present I do not see any. Urinalysis is pending.  ____________________________________________   FINAL CLINICAL IMPRESSION(S) / ED DIAGNOSES  Final diagnoses:  Symptomatic anemia  Gastrointestinal hemorrhage, unspecified gastrointestinal hemorrhage type      NEW MEDICATIONS STARTED DURING THIS VISIT:  New Prescriptions   No medications on file     Note:  This document was prepared using Dragon voice recognition software and may include unintentional dictation errors.     Delman Kitten, MD 07/18/17 [phone]

## 2017-07-19 ENCOUNTER — Inpatient Hospital Stay: Payer: Medicare Other | Admitting: Anesthesiology

## 2017-07-19 ENCOUNTER — Encounter
Admission: EM | Disposition: A | Discharge status: Home / Self Care | Payer: Self-pay | Source: Home / Self Care | Attending: Internal Medicine

## 2017-07-19 DIAGNOSIS — K922 Gastrointestinal hemorrhage, unspecified: Secondary | ICD-10-CM

## 2017-07-19 DIAGNOSIS — K31819 Angiodysplasia of stomach and duodenum without bleeding: Secondary | ICD-10-CM

## 2017-07-19 DIAGNOSIS — K5520 Angiodysplasia of colon without hemorrhage: Secondary | ICD-10-CM

## 2017-07-19 HISTORY — PX: ESOPHAGOGASTRODUODENOSCOPY: SHX5428

## 2017-07-19 LAB — CBC
HCT: 26.9 % — ABNORMAL LOW (ref 35.0–47.0)
Hemoglobin: 9 g/dL — ABNORMAL LOW (ref 12.0–16.0)
MCH: 28.3 pg (ref 26.0–34.0)
MCHC: 33.4 g/dL (ref 32.0–36.0)
MCV: 84.7 fL (ref 80.0–100.0)
PLATELETS: 173 10*3/uL (ref 150–440)
RBC: 3.17 MIL/uL — AB (ref 3.80–5.20)
RDW: 15.5 % — AB (ref 11.5–14.5)
WBC: 6.6 10*3/uL (ref 3.6–11.0)

## 2017-07-19 LAB — BASIC METABOLIC PANEL
Anion gap: 6 (ref 5–15)
BUN: 23 mg/dL — AB (ref 6–20)
CALCIUM: 8.8 mg/dL — AB (ref 8.9–10.3)
CO2: 23 mmol/L (ref 22–32)
CREATININE: 1.12 mg/dL — AB (ref 0.44–1.00)
Chloride: 111 mmol/L (ref 101–111)
GFR, EST AFRICAN AMERICAN: 54 mL/min — AB (ref >60)
GFR, EST NON AFRICAN AMERICAN: 47 mL/min — AB (ref >60)
Glucose, Bld: 85 mg/dL (ref 65–99)
Potassium: 3.8 mmol/L (ref 3.5–5.1)
SODIUM: 140 mmol/L (ref 135–145)

## 2017-07-19 LAB — GLUCOSE, CAPILLARY
GLUCOSE-CAPILLARY: 95 mg/dL (ref 65–99)
GLUCOSE-CAPILLARY: 93 mg/dL (ref 65–99)

## 2017-07-19 SURGERY — EGD (ESOPHAGOGASTRODUODENOSCOPY)
Anesthesia: General

## 2017-07-19 SURGERY — ENTEROSCOPY
Anesthesia: General

## 2017-07-19 MED ORDER — INFLUENZA VAC SPLIT HIGH-DOSE 0.5 ML IM SUSY
0.5000 mL | PREFILLED_SYRINGE | Freq: Once | INTRAMUSCULAR | Status: DC
  Filled 2017-07-19: qty 0.5

## 2017-07-19 MED ORDER — PROPOFOL 500 MG/50ML IV EMUL
INTRAVENOUS | Status: DC | PRN
  Administered 2017-07-19: 30 ug/kg/min via INTRAVENOUS

## 2017-07-19 MED ORDER — LIDOCAINE HCL (PF) 2 % IJ SOLN
INTRAMUSCULAR | Status: AC
  Filled 2017-07-19: qty 2

## 2017-07-19 MED ORDER — PROPOFOL 10 MG/ML IV BOLUS
INTRAVENOUS | Status: DC | PRN
  Administered 2017-07-19: 120 mg via INTRAVENOUS

## 2017-07-19 MED ORDER — SODIUM CHLORIDE 0.9 % IV SOLN
510.0000 mg | Freq: Once | INTRAVENOUS | Status: AC
  Administered 2017-07-19: 15:00:00 510 mg via INTRAVENOUS
  Filled 2017-07-19: qty 17

## 2017-07-19 MED ORDER — PROPOFOL 10 MG/ML IV BOLUS
INTRAVENOUS | Status: AC
  Filled 2017-07-19: qty 20

## 2017-07-19 NOTE — Transfer of Care (Signed)
Immediate Anesthesia Transfer of Care Note  Patient: Kathryn Ware  Procedure(s) Performed: Procedure(s): ESOPHAGOGASTRODUODENOSCOPY (EGD) (N/A)  Patient Location: PACU  Anesthesia Type:General  Level of Consciousness: awake  Airway & Oxygen Therapy: Patient Spontanous Breathing and Patient connected to nasal cannula oxygen  Post-op Assessment: Report given to RN and Post -op Vital signs reviewed and stable  Post vital signs: Reviewed  Last Vitals:  Vitals:   07/19/17 0510 07/19/17 0910  BP: (!) 141/58 (!) 144/56  Pulse: 72 74  Resp:  18  Temp: 36.8 C 36.8 C  SpO2: 97% 98%    Last Pain:  Vitals:   07/19/17 0910  TempSrc: Oral         Complications: No apparent anesthesia complications

## 2017-07-19 NOTE — Anesthesia Preprocedure Evaluation (Signed)
Anesthesia Evaluation  Patient identified by MRN, date of birth, ID band Patient awake    Reviewed: Allergy & Precautions, NPO status , Patient's Chart, lab work & pertinent test results  Airway Mallampati: II       Dental  (+) Upper Dentures, Lower Dentures   Pulmonary COPD, Current Smoker,     + decreased breath sounds      Cardiovascular hypertension, Pt. on medications +CHF   Rhythm:Regular     Neuro/Psych negative neurological ROS  negative psych ROS   GI/Hepatic negative GI ROS, Neg liver ROS,   Endo/Other  diabetes, Type 2, Oral Hypoglycemic Agents  Renal/GU      Musculoskeletal   Abdominal Normal abdominal exam  (+)   Peds negative pediatric ROS (+)  Hematology  (+) anemia ,   Anesthesia Other Findings   Reproductive/Obstetrics                             Anesthesia Physical Anesthesia Plan  ASA: III  Anesthesia Plan: General   Post-op Pain Management:    Induction: Intravenous  PONV Risk Score and Plan: 0  Airway Management Planned: Natural Airway and Nasal Cannula  Additional Equipment:   Intra-op Plan:   Post-operative Plan:   Informed Consent: I have reviewed the patients History and Physical, chart, labs and discussed the procedure including the risks, benefits and alternatives for the proposed anesthesia with the patient or authorized representative who has indicated his/her understanding and acceptance.     Plan Discussed with: Surgeon  Anesthesia Plan Comments:         Anesthesia Quick Evaluation

## 2017-07-19 NOTE — Op Note (Signed)
[name] Gastroenterology Patient Name: Kathryn Ware Procedure Date: 07/19/2017 1:02 PM MRN: [id number] Account #: [id number] Date of Birth: 11-01-1940 Admit Type: Inpatient Age: 76 Room: [facility] ENDO ROOM 4 Gender: Female Note Status: Finalized Procedure:            Upper GI endoscopy Indications:          Iron deficiency anemia secondary to chronic blood loss,                        Iron deficiency anemia due to suspected upper                        gastrointestinal bleeding, Suspected upper                        gastrointestinal bleeding in patient with chronic blood                        loss, h/o small bowel AVMs Providers:            Lin Landsman MD, MD Medicines:            Monitored Anesthesia Care Complications:        No immediate complications. Estimated blood loss: None. Procedure:            Pre-Anesthesia Assessment:                       - Prior to the procedure, a History and Physical was                        performed, and patient medications and allergies were                        reviewed. The patient is competent. The risks and                        benefits of the procedure and the sedation options and                        risks were discussed with the patient. All questions                        were answered and informed consent was obtained.                        Patient identification and proposed procedure were                        verified by the physician, the nurse, the                        anesthesiologist, the anesthetist and the technician in                        the pre-procedure area in the procedure room. Mental                        Status Examination: alert and oriented. Airway  Examination: normal oropharyngeal airway and neck                        mobility. Respiratory Examination: clear to                        auscultation. CV Examination: normal. Prophylactic         Antibiotics: The patient does not require prophylactic                        antibiotics. Prior Anticoagulants: The patient has                        taken no previous anticoagulant or antiplatelet agents.                        ASA Grade Assessment: III - A patient with severe                        systemic disease. After reviewing the risks and                        benefits, the patient was deemed in satisfactory                        condition to undergo the procedure. The anesthesia plan                        was to use monitored anesthesia care (MAC). Immediately                        prior to administration of medications, the patient was                        re-assessed for adequacy to receive sedatives. The                        heart rate, respiratory rate, oxygen saturations, blood                        pressure, adequacy of pulmonary ventilation, and                        response to care were monitored throughout the                        procedure. The physical status of the patient was                        re-assessed after the procedure.                       After obtaining informed consent, the endoscope was                        passed under direct vision. Throughout the procedure,                        the patient's blood pressure, pulse, and oxygen  saturations were monitored continuously. The Endoscope                        was introduced through the mouth, and advanced to the                        third part of duodenum. The upper GI endoscopy was                        accomplished without difficulty. The patient tolerated                        the procedure well. Findings:      A few medium angiodysplastic lesions without bleeding were found in the       second portion of the duodenum and in the third portion of the duodenum.       Coagulation for bleeding prevention using argon plasma at 0.5       liters/minute and  20 watts was successful. Impression:           - A few non-bleeding angiodysplastic lesions in the                        duodenum. Treated with argon plasma coagulation (APC).                       - No specimens collected. Recommendation:       - Return patient to hospital ward for ongoing care.                       - Advance diet as tolerated today.                       - Continue present medications. Procedure Code(s):    --- Professional ---                       [phone], Esophagogastroduodenoscopy, flexible, transoral;                        with control of bleeding, any method Diagnosis Code(s):    --- Professional ---                       I96.789, Angiodysplasia of stomach and duodenum without                        bleeding                       D50.0, Iron deficiency anemia secondary to blood loss                        (chronic)                       D50.9, Iron deficiency anemia, unspecified                       R58, Hemorrhage, not elsewhere classified CPT copyright 2016 [employer] Medical Association. All rights reserved. The codes documented in this report are preliminary and upon coder review may  be revised to meet current compliance requirements. Dr. Ulyess Mort   Raeanne Gathers MD, MD 07/19/2017 1:22:11 PM This report has been signed electronically. Number of Addenda: 0 Note Initiated On: 07/19/2017 1:02 PM      [facility]

## 2017-07-19 NOTE — OR Nursing (Signed)
REPORT CALLED TO BRANDY ON FLOOR. I/S RN THAT PT TO RECEIVE IV IRON THEN CAN BE DISCHARGED HOME TODAY. UNDERSTANDING VOICED. PT WILL BE TRANSPORTED BACK TO UNIT AFTER POSTOP COURSE VIA ORDERLY

## 2017-07-19 NOTE — Anesthesia Post-op Follow-up Note (Signed)
Anesthesia QCDR form completed.        

## 2017-07-19 NOTE — Anesthesia Postprocedure Evaluation (Signed)
Anesthesia Post Note  Patient: Kathryn Ware  Procedure(s) Performed: Procedure(s) (LRB): ESOPHAGOGASTRODUODENOSCOPY (EGD) (N/A)  Patient location during evaluation: PACU Anesthesia Type: General Level of consciousness: awake Pain management: pain level controlled Vital Signs Assessment: post-procedure vital signs reviewed and stable Respiratory status: spontaneous breathing Cardiovascular status: stable Anesthetic complications: no     Last Vitals:  Vitals:   07/19/17 1320 07/19/17 1340  BP: 128/63 (!) 127/54  Pulse:  75  Resp:  (!) 24  Temp: (!) 35.8 C   SpO2:  97%    Last Pain:  Vitals:   07/19/17 1320  TempSrc: Tympanic                 VAN STAVEREN,Morey Andonian

## 2017-07-19 NOTE — Op Note (Signed)
[facility] Patient Name: Kathryn Ware Procedure Date: 07/19/2017 11:53 AM MRN: [id number] Account #: [id number] Date of Birth: [birth year] Admit Type: Inpatient Age: 76 Room: [facility] ENDO ROOM 4 Gender: Female Note Status: Finalized Procedure:            Upper Device-Assisted Enteroscopy without Fluoroscopy Indications:          Obscure gastrointestinal bleeding, Recurrent bleeding                        in the small bowel, Arteriovenous malformation in the                        small intestine Providers:            Lin Landsman MD, MD Referring MD:         No Local Md, MD (Referring MD) Medicines:            Monitored Anesthesia Care Complications:        No immediate complications. Estimated blood loss: None. Procedure:            Pre-Anesthesia Assessment:                       - Prior to the procedure, a History and Physical was                        performed, and patient medications and allergies were                        reviewed. The patient is competent. The risks and                        benefits of the procedure and the sedation options and                        risks were discussed with the patient. All questions                        were answered and informed consent was obtained.                        Patient identification and proposed procedure were                        verified by the physician, the nurse, the                        anesthesiologist, the anesthetist and the technician in                        the pre-procedure area in the procedure room. Mental                        Status Examination: alert and oriented. Airway                        Examination: normal oropharyngeal airway and neck                        mobility. Respiratory Examination: clear to  auscultation. CV Examination: normal. Prophylactic                        Antibiotics: The patient does not require prophylactic                         antibiotics. Prior Anticoagulants: The patient has                        taken no previous anticoagulant or antiplatelet agents.                        ASA Grade Assessment: III - A patient with severe                        systemic disease. After reviewing the risks and                        benefits, the patient was deemed in satisfactory                        condition to undergo the procedure. The anesthesia plan                        was to use monitored anesthesia care (MAC). Immediately                        prior to administration of medications, the patient was                        re-assessed for adequacy to receive sedatives. The                        heart rate, respiratory rate, oxygen saturations, blood                        pressure, adequacy of pulmonary ventilation, and                        response to care were monitored throughout the                        procedure. The physical status of the patient was                        re-assessed after the procedure.                       After obtaining informed consent, the endoscope was                        passed under direct vision using the balloon-assisted                        technique. Throughout the procedure, the patient's                        blood pressure, pulse, and oxygen saturations were                        monitored continuously.  The Endoscope was introduced                        through the mouth, and advanced to the mid-jejunum                        After obtaining informed consent, the endoscope was                        passed under direct vision using the balloon-assisted                        technique. Throughout the procedure, the patient's                        blood pressure, pulse, and oxygen saturations were                        monitored continuously. The upper device-assisted                        enteroscopy was accomplished without difficulty.  The                        patient tolerated the procedure well. Findings:      Three angiodysplastic lesions with no bleeding were found in the       proximal jejunum. Coagulation for bleeding prevention using argon plasma       at 0.5 liters/minute and 20 watts was successful. Impression:           - Three non-bleeding angiodysplastic lesions in the                        jejunum. Treated with argon plasma coagulation (APC).                       - No specimens collected. Recommendation:       - Return patient to hospital ward for possible                        discharge same day.                       - Advance diet as tolerated today.                       - Continue present medications. Procedure Code(s):    --- Professional ---                       [id number], Small intestinal endoscopy, enteroscopy beyond                        second portion of duodenum, not including ileum; with                        control of bleeding (eg, injection, bipolar cautery,                        unipolar cautery, laser, heater probe, stapler, plasma  coagulator) Diagnosis Code(s):    --- Professional ---                       K55.20, Angiodysplasia of colon without hemorrhage                       K92.2, Gastrointestinal hemorrhage, unspecified                       Q27.33, Arteriovenous malformation of digestive system                        vessel CPT copyright 2016 American Medical Association. All rights reserved. The codes documented in this report are preliminary and upon coder review may  be revised to meet current compliance requirements. Dr. Ulyess Mort Lin Landsman MD, MD 07/19/2017 1:25:27 PM This report has been signed electronically. Number of Addenda: 0 Note Initiated On: 07/19/2017 11:53 AM      [facility]

## 2017-07-19 NOTE — Discharge Summary (Signed)
[place] [facility] [place] NAME: Kathryn Ware    MR#:  [id number]  DATE OF BIRTH:  08/25/1941  DATE OF ADMISSION:  07/18/2017 ADMITTING PHYSICIAN: Baxter Hire, MD  DATE OF DISCHARGE: 07/19/2017   PRIMARY CARE PHYSICIAN: Venia Carbon, MD    ADMISSION DIAGNOSIS:  Symptomatic anemia [D64.9] Gastrointestinal hemorrhage, unspecified gastrointestinal hemorrhage type [K92.2]  DISCHARGE DIAGNOSIS:  Active Problems:   GI bleed   SECONDARY DIAGNOSIS:   Past Medical History:  Diagnosis Date  . Anemia   . ARF (acute renal failure) ([place])   . Blood transfusion without reported diagnosis   . Diabetes mellitus type II   . GI (gastrointestinal bleed)   . GI AVM (gastrointestinal arteriovenous vascular malformation)    stomach  . History of echocardiogram 12/2013   a. 12/2013: EF 60-65%, DD, mild LVH, nl RV size & systolic function, mild MR/TR, mild AS, nl RVSP; b. TTE 04/2017: EF 60-65%, normal wall motion, GR1DD, mild MR, left atrium normal in size, normal RV systolic function, PASP normal   . History of nuclear stress test    a. 12/2013: low risk, no sig WMA, nondiag EKG 2/2 baseline LVH w/ repol abnl, no sig ischemia, EF 70% no artifact  . HLD (hyperlipidemia)   . HTN (hypertension)   . Hypercholesterolemia   . IDA (iron deficiency anemia)   . Polyp of colon, adenomatous   . RLS (restless legs syndrome)     HOSPITAL COURSE:   1. GI bleeding. Likely source is her duodenal AVMs. She is sulcrafate and Protonix. Will continue these.  2 units PRBC given, Hb came up.   GI did push endoscopy- up to jejunum, had AVMs- which were cauterized.  IV iron given, suggest to follow in oncology clinic for iv iron. 2. Acute on chronic blood loss anemia.  transfuse 2 units packed red blood cells. Recheck hemoglobin posttransfusion. Consider letting her with IV iron. Continue current iron supplemental. Hb came up. 3. Diabetes. on sliding scale  insulin and hold her Glucophage.   4. Hypertension. We'll continue current medications. Next line  DISCHARGE CONDITIONS:   Stable.  CONSULTS OBTAINED:  Treatment Team:  Lin Landsman, MD  DRUG ALLERGIES:   Allergies  Allergen Reactions  . Metformin     REACTION: diarrhea when dosage is increased, patient aware that she is onmetformin when it is listed that she is allergic  . Nifedipine     REACTION: cough    DISCHARGE MEDICATIONS:   Current Discharge Medication List    CONTINUE these medications which have NOT CHANGED   Details  ferrous sulfate 325 (65 FE) MG tablet Take 325 mg by mouth 2 (two) times daily.     lisinopril-hydrochlorothiazide (PRINZIDE,ZESTORETIC) 20-12.5 MG tablet Take 1 tablet by mouth daily. Qty: 1 tablet, Refills: 0    metFORMIN (GLUCOPHAGE-XR) 500 MG 24 hr tablet Take 500 mg by mouth every evening.     omeprazole (PRILOSEC) 40 MG capsule Take 40 mg by mouth 2 (two) times daily as needed. For heartburn    simvastatin (ZOCOR) 80 MG tablet Take 80 mg by mouth at bedtime.    sucralfate (CARAFATE) 1 g tablet Take 1 g by mouth 4 (four) times daily as needed. For indigestion    vitamin B-12 (CYANOCOBALAMIN) 1000 MCG tablet Take 1,000 mcg by mouth daily.    clonazePAM (KLONOPIN) 0.5 MG tablet Take 1 tablet (0.5 mg total) by mouth daily as needed. Qty: 30 tablet, Refills:  0    ONE TOUCH ULTRA TEST test strip TEST AS DIRECTED ONCE DAILY Qty: 100 each, Refills: 0         DISCHARGE INSTRUCTIONS:    Follow in oncology clinc and GI clinic.  If you experience worsening of your admission symptoms, develop shortness of breath, life threatening emergency, suicidal or homicidal thoughts you must seek medical attention immediately by calling 911 or calling your MD immediately  if symptoms less severe.  You Must read complete instructions/literature along with all the possible adverse reactions/side effects for all the Medicines you take and that have  been prescribed to you. Take any new Medicines after you have completely understood and accept all the possible adverse reactions/side effects.   Please note  You were cared for by a hospitalist during your hospital stay. If you have any questions about your discharge medications or the care you received while you were in the hospital after you are discharged, you can call the unit and asked to speak with the hospitalist on call if the hospitalist that took care of you is not available. Once you are discharged, your primary care physician will handle any further medical issues. Please note that NO REFILLS for any discharge medications will be authorized once you are discharged, as it is imperative that you return to your primary care physician (or establish a relationship with a primary care physician if you do not have one) for your aftercare needs so that they can reassess your need for medications and monitor your lab values.    Today   CHIEF COMPLAINT:   Chief Complaint  Patient presents with  . Weakness    HISTORY OF PRESENT ILLNESS:  Kathryn Ware  is a 76 y.o. female who was discharged from the hospital 3 days ago following an admission for chest pain. Workup then revealed this was demand ischemia from her anemia. She does have a history of bleeding AVMs and hurt a lot numb. Her hemoglobin at discharge was 8.3. For the past couple days she's been feeling weak and gets dizzy when she stands up to do anything. She has not noted any dark stools or blood. However here in the ER hemoglobin is now 6. Hospitalist services were consulted for admission.   VITAL SIGNS:  Blood pressure (!) 127/54, pulse 75, temperature (!) 96.4 F (35.8 C), temperature source Tympanic, resp. rate (!) 24, height 5\' 4"  (1.626 m), weight 51.6 kg (113 lb 12.8 oz), SpO2 97 %.  I/O:   Intake/Output Summary (Last 24 hours) at 07/19/17 1654 Last data filed at 07/19/17 4315  Gross per 24 hour  Intake          1642.33  ml  Output                0 ml  Net          1642.33 ml    PHYSICAL EXAMINATION:  GENERAL:  77 y.o.-year-old patient lying in the bed with no acute distress.  EYES: Pupils equal, round, reactive to light and accommodation. No scleral icterus. Extraocular muscles intact.  HEENT: Head atraumatic, normocephalic. Oropharynx and nasopharynx clear.  NECK:  Supple, no jugular venous distention. No thyroid enlargement, no tenderness.  LUNGS: Normal breath sounds bilaterally, no wheezing, rales,rhonchi or crepitation. No use of accessory muscles of respiration.  CARDIOVASCULAR: S1, S2 normal. No murmurs, rubs, or gallops.  ABDOMEN: Soft, non-tender, non-distended. Bowel sounds present. No organomegaly or mass.  EXTREMITIES: No pedal edema, cyanosis, or clubbing.  NEUROLOGIC: Cranial nerves II through XII are intact. Muscle strength 5/5 in all extremities. Sensation intact. Gait not checked.  PSYCHIATRIC: The patient is alert and oriented x 3.  SKIN: No obvious rash, lesion, or ulcer.   DATA REVIEW:   CBC  Recent Labs Lab 07/19/17 0506  WBC 6.6  HGB 9.0*  HCT 26.9*  PLT 173    Chemistries   Recent Labs Lab 07/19/17 0506  NA 140  K 3.8  CL 111  CO2 23  GLUCOSE 85  BUN 23*  CREATININE 1.12*  CALCIUM 8.8*    Cardiac Enzymes  Recent Labs Lab 07/15/17 1207  TROPONINI 0.06*    Microbiology Results  No results found for this or any previous visit.  RADIOLOGY:  Dg Chest Portable 1 View  Result Date: 07/18/2017 CLINICAL DATA:  Cough and low-grade fever EXAM: PORTABLE CHEST 1 VIEW COMPARISON:  07/14/2017 FINDINGS: Cardiac shadow is within normal limits. Aortic calcifications are seen. The lungs are well aerated bilaterally. No focal infiltrate or sizable effusion is seen. Biapical scarring is seen. No acute bony abnormality is noted. IMPRESSION: No active disease. Electronically Signed   By: Inez Catalina M.D.   On: 07/18/2017 15:07    EKG:   Orders placed or performed  during the hospital encounter of 07/18/17  . ED EKG  . ED EKG  . EKG 12-Lead  . EKG 12-Lead  . EKG 12-Lead  . EKG 12-Lead      Management plans discussed with the patient, family and they are in agreement.  CODE STATUS:     Code Status Orders        Start     Ordered   07/18/17 1823  Full code  Continuous     07/18/17 1823    Code Status History    Date Active Date Inactive Code Status Order ID Comments User Context   07/14/2017 10:48 PM 07/15/2017  5:52 PM Full Code [id number]  Lance Coon, MD Inpatient   05/10/2017  8:00 PM 05/12/2017  2:04 PM Full Code [id number]  Vaughan Basta, MD Inpatient   07/05/2016  1:01 PM 07/08/2016  2:02 PM Full Code [id number]  Fritzi Mandes, MD Inpatient   10/13/2015  8:53 PM 10/14/2015  4:36 PM Full Code [id number]  Dustin Flock, MD Inpatient   03/10/2015  9:31 PM 03/13/2015  1:11 PM Full Code [id number]  Theodoro Grist, MD Inpatient      TOTAL TIME TAKING CARE OF THIS PATIENT: 35 minutes.    Vaughan Basta M.D on 07/19/2017 at 4:55 PM  Between 7am to 6pm - Pager - [phone]  After 6pm go to [email or web address] - password [email or web address] [place] [facility]  Office  [phone]  CC: Primary care physician; Venia Carbon, MD   Note: This dictation was prepared with Dragon dictation along with smaller phrase technology. Any transcriptional errors that result from this process are unintentional.

## 2017-07-19 NOTE — Progress Notes (Signed)
EGD/push enteroscopy performed  Enteroscopy was advanced to proximal/mid jejunum Multiple nonbleeding small bowel large AVMs in the distal duodenum and proximal jejunum were identified and treated with argon plasma coagulation.  Normal stomach, normal esophagus  Recommendations - Advance diet as tolerated - IV iron, ordered - Okay to discharge today from GI standpoint after iron infusion - Did not perform VCE as push enteroscopy was performed - Follow-up in GI clinic with Dr Vira Agar - Monitor CBC closely - Arrange for IV iron replacement as outpatient  Cephas Darby, MD [address]  [place]  [place], [place] [address]  Main: [phone]  Fax: [phone] Pager: [phone]

## 2017-07-20 ENCOUNTER — Encounter: Payer: Self-pay | Admitting: Gastroenterology

## 2017-07-20 LAB — TYPE AND SCREEN
ABO/RH(D): B POS
ANTIBODY SCREEN: NEGATIVE
UNIT DIVISION: 0
Unit division: 0

## 2017-07-20 LAB — BPAM RBC
BLOOD PRODUCT EXPIRATION DATE: 201810162359
BLOOD PRODUCT EXPIRATION DATE: 201810172359
ISSUE DATE / TIME: 201809260029
ISSUE DATE / TIME: 201809252055
Unit Type and Rh: 1700
Unit Type and Rh: 7300

## 2017-07-21 ENCOUNTER — Telehealth: Payer: Self-pay

## 2017-07-21 ENCOUNTER — Telehealth: Payer: Self-pay | Admitting: Nurse Practitioner

## 2017-07-21 ENCOUNTER — Ambulatory Visit: Payer: Medicare Other

## 2017-07-21 NOTE — Telephone Encounter (Signed)
S/w pt's sister, Freda Munro, (verbal ok from patient) who understands to have pt monitor sx and call if chest pain returns.  No need to reschedule lexi at this time.

## 2017-07-21 NOTE — Telephone Encounter (Signed)
Pt at [facility] 9/21-/922 exertional chest pain. Hgb 8.3 Outpatient lexi ordered and scheduled for Sept 28 Pt returned to the ER 9/25 w/chest pain/symptomatic anemia. Hgb 6.0. Pt transfused 2 units pRBC EGD/push enteroscopy performed. "Multiple nonbleeding small bowel large AVMs in the distal duodenum and proximal jejunum were identified " Hgb 9.0 at 9/26 discharge. She forgot about lexi today. I s/w pt this morning; states she is no longer having chest pain. She asks I s/w her sister, Freda Munro. Sister confirms pt has been chest pain free. Will route to MD to determine if there is still a need for myoview.

## 2017-07-21 NOTE — Telephone Encounter (Signed)
She should call us if more chest pain. We can hold off for now.

## 2017-07-21 NOTE — Telephone Encounter (Signed)
Pt calling stating she thought her Myoview was rescheduled to another day  Would like a call back to reschedule this

## 2017-07-21 NOTE — Telephone Encounter (Signed)
Spoke with patient.  She states that she is feeling better and currently does not need anything. Her sister stays with her all of the time and helps her with any in-home ADL and iADL support needs.  They also have been in touch with Dr. Tyrell Antonio office.  She is not having any further chest pains but is aware to call their office if any symptoms develop.  She currently does not have any appointments set up with him or with the cancer center.  Will forward to Dr. Silvio Pate as an Juluis Rainier.  Patient not scheduled to see [place] here again until December.

## 2017-07-22 NOTE — Telephone Encounter (Signed)
Okay Her issue is more GI bleed than cardiac. Hopefully she will not be bleeding any more

## 2017-07-24 ENCOUNTER — Encounter: Payer: Self-pay | Admitting: Gastroenterology

## 2017-08-21 ENCOUNTER — Other Ambulatory Visit: Payer: Self-pay | Admitting: Internal Medicine

## 2017-08-21 ENCOUNTER — Telehealth: Payer: Self-pay | Admitting: Internal Medicine

## 2017-08-21 NOTE — Telephone Encounter (Signed)
I may have misread the last refill date as 08-03-17 instead of 08-03-16. Forward to Dr Silvio Pate

## 2017-08-21 NOTE — Telephone Encounter (Signed)
Copied from [place] #[id number]. Topic: Inquiry >> Aug 21, 2017  7:07 PM Cecelia Byars, NT wrote: Reason for ZOX:WRUEAVW called pharmacy ,said refill has not been called in yet

## 2017-08-21 NOTE — Telephone Encounter (Signed)
Pt called again asking about Klonopin refill, pt notified that message will be routed to Dr. Silvio Pate. Previous telephone encounters routed to Dr. Silvio Pate on today  regarding the pt asking for the refill of Klonopin.

## 2017-08-21 NOTE — Telephone Encounter (Signed)
I read the refill request for clonazepam on 08/21/17 but I could not find where clonazepam was filled since 08/03/2016. Did you find something that pt had med refilled recently?

## 2017-08-21 NOTE — Telephone Encounter (Signed)
Patient called back to update her phone number. Updated patient's number. She wants a call back about a prescription as soon as possible.

## 2017-08-21 NOTE — Telephone Encounter (Signed)
Please see request for Clonazepam.

## 2017-08-21 NOTE — Telephone Encounter (Signed)
Copied from [place] #[id number]. Topic: Quick Communication - See Telephone Encounter >> Aug 21, 2017  2:55 PM Bea Graff, NT wrote: CRM for notification. See Telephone encounter for:  08/21/17. Patient requesting a refill of her clonazepam. Please call once filled Patient has called again about having her RX refilled.

## 2017-08-21 NOTE — Telephone Encounter (Signed)
See previous note

## 2017-08-21 NOTE — Telephone Encounter (Signed)
Copied from [place] #[id number]. Topic: General >> Aug 21, 2017  6:01 PM Neva Seat wrote: Reason for CRM:   Pt is calling pharmacy to find out if medication was refilled.  Pt needs refill if it hasn't been called in for refill.

## 2017-08-21 NOTE — Telephone Encounter (Signed)
Copied from [place] #[id number]. Topic: Quick Communication - See Telephone Encounter >> Aug 21, 2017  2:55 PM Bea Graff, NT wrote: CRM for notification. See Telephone encounter for:  08/21/17. Patient requesting a refill of her clonazepam. Please call once filled

## 2017-08-22 MED ORDER — CLONAZEPAM 0.5 MG PO TABS: 0.5000 mg | ORAL_TABLET | Freq: Every day | ORAL | 0 refills | Status: DC | PRN

## 2017-08-22 NOTE — Telephone Encounter (Signed)
Left refill on voice mail at pharmacy   Tried to call pt. No answer and no vm.

## 2017-08-22 NOTE — Telephone Encounter (Signed)
Left refill on voice mail at pharmacy  

## 2017-08-22 NOTE — Telephone Encounter (Signed)
Approved: 30 x 0 

## 2017-09-29 ENCOUNTER — Other Ambulatory Visit: Payer: Self-pay | Admitting: Internal Medicine

## 2017-09-29 NOTE — Telephone Encounter (Signed)
Copied from [place] [phone]. Topic: Quick Communication - See Telephone Encounter >> Sep 29, 2017 11:39 AM Aurelio Brash B wrote: CRM for notification. See Telephone encounter for:  Refill clonazpam  [facility] 09/29/17. PT is out of med

## 2017-10-02 ENCOUNTER — Ambulatory Visit: Payer: Medicare Other | Admitting: Internal Medicine

## 2017-10-02 NOTE — Telephone Encounter (Signed)
Medication needs approval.Thanks.

## 2017-10-04 MED ORDER — CLONAZEPAM 0.5 MG PO TABS: 0.5000 mg | ORAL_TABLET | Freq: Every day | ORAL | 0 refills | Status: DC | PRN

## 2017-10-04 NOTE — Telephone Encounter (Signed)
Approved: 30 x 0 

## 2017-10-04 NOTE — Telephone Encounter (Signed)
Left refill on voice mail at pharmacy  

## 2017-10-04 NOTE — Telephone Encounter (Signed)
Ok to refill? Last prescribed on 08/22/2017. Last seen on 05/31/2017. Follow up on 10/02/2017 due to weather.

## 2017-11-06 ENCOUNTER — Other Ambulatory Visit: Payer: Self-pay | Admitting: Internal Medicine

## 2017-11-06 NOTE — Telephone Encounter (Signed)
Last filled 10-04-17 #30  Last OV Acute 06-05-17 10-02-17 OV CXL due to snow No Future OV  (placed under normal in case it can be sent electronically)

## 2017-12-13 ENCOUNTER — Ambulatory Visit: Payer: Medicare Other | Admitting: Internal Medicine

## 2017-12-19 ENCOUNTER — Ambulatory Visit: Payer: Medicare Other | Admitting: Internal Medicine

## 2017-12-19 ENCOUNTER — Encounter: Payer: Self-pay | Admitting: Internal Medicine

## 2017-12-19 VITALS — BP 140/72 | HR 105 | Temp 97.4°F | Wt 119.0 lb

## 2017-12-19 DIAGNOSIS — I10 Essential (primary) hypertension: Secondary | ICD-10-CM

## 2017-12-19 DIAGNOSIS — I5032 Chronic diastolic (congestive) heart failure: Secondary | ICD-10-CM | POA: Diagnosis not present

## 2017-12-19 DIAGNOSIS — E441 Mild protein-calorie malnutrition: Secondary | ICD-10-CM | POA: Diagnosis not present

## 2017-12-19 DIAGNOSIS — E118 Type 2 diabetes mellitus with unspecified complications: Secondary | ICD-10-CM | POA: Diagnosis not present

## 2017-12-19 DIAGNOSIS — I700 Atherosclerosis of aorta: Secondary | ICD-10-CM | POA: Diagnosis not present

## 2017-12-19 DIAGNOSIS — R413 Other amnesia: Secondary | ICD-10-CM | POA: Diagnosis not present

## 2017-12-19 LAB — COMPREHENSIVE METABOLIC PANEL
ALBUMIN: 4.4 g/dL (ref 3.5–5.2)
ALK PHOS: 43 U/L (ref 39–117)
ALT: 7 U/L (ref 0–35)
AST: 14 U/L (ref 0–37)
BUN: 20 mg/dL (ref 6–23)
CALCIUM: 10.6 mg/dL — AB (ref 8.4–10.5)
CHLORIDE: 105 meq/L (ref 96–112)
CO2: 28 mEq/L (ref 19–32)
Creatinine, Ser: 1.08 mg/dL (ref 0.40–1.20)
GFR: 63.46 mL/min (ref >60.00)
Glucose, Bld: 61 mg/dL — ABNORMAL LOW (ref 70–99)
Potassium: 4.5 mEq/L (ref 3.5–5.1)
Sodium: 140 mEq/L (ref 135–145)
Total Bilirubin: 0.4 mg/dL (ref 0.2–1.2)
Total Protein: 7.8 g/dL (ref 6.0–8.3)

## 2017-12-19 LAB — CBC
HEMATOCRIT: 36.7 % (ref 36.0–46.0)
Hemoglobin: 12.2 g/dL (ref 12.0–15.0)
MCHC: 33.3 g/dL (ref 30.0–36.0)
MCV: 84.1 fl (ref 78.0–100.0)
Platelets: 236 10*3/uL (ref 150.0–400.0)
RBC: 4.37 Mil/uL (ref 3.87–5.11)
RDW: 13.8 % (ref 11.5–15.5)
WBC: 4.7 10*3/uL (ref 4.0–10.5)

## 2017-12-19 LAB — T4, FREE: Free T4: 0.66 ng/dL (ref 0.60–1.60)

## 2017-12-19 LAB — VITAMIN B12: Vitamin B-12: 870 pg/mL (ref 211–911)

## 2017-12-19 LAB — HEMOGLOBIN A1C: HEMOGLOBIN A1C: 6.2 % (ref 4.6–6.5)

## 2017-12-19 MED ORDER — CLONAZEPAM 0.5 MG PO TABS: 0.5000 mg | ORAL_TABLET | Freq: Every day | ORAL | 0 refills | Status: DC | PRN

## 2017-12-19 NOTE — Assessment & Plan Note (Signed)
I suspect vascular damage due to severe anemia in past Pattern not really like Alzheimers Will check labs, MRI

## 2017-12-19 NOTE — Patient Instructions (Signed)
Please set up with a dermatologist to take the thing off your arm (Dr Phillip Heal--- [phone]). Let me know if you are not able to get an appointment.

## 2017-12-19 NOTE — Assessment & Plan Note (Signed)
Mild on CXR Is on statin No ASA due to bleeding

## 2017-12-19 NOTE — Assessment & Plan Note (Signed)
Compensated Probably mostly a problem with severe anemia

## 2017-12-19 NOTE — Progress Notes (Signed)
Subjective:    Patient ID: Kathryn Ware, female    DOB: 1941/09/11, 77 y.o.   MRN: [id number]  HPI Here with sister, Freda Munro for follow up of diabetes and other chronic medical conditions (they live together)  Doing okay No recent bleeding Appetite is okay Weight up again slightly  Checking sugars every 3 days or so Usually 120's fasting No low sugar reactions No foot sores, pain or numbness  No chest pain No foot pain or claudication No SOB  Having more memory problems Gave up driving--would get lost Had to give up working Seems most noticeable since last summer Instrumental ADLs are fine  Current Outpatient Medications on File Prior to Visit  Medication Sig Dispense Refill  . clonazePAM (KLONOPIN) 0.5 MG tablet TAKE 1 TABLET BY MOUTH DAILY AS NEEDED 30 tablet 0  . ferrous sulfate 325 (65 FE) MG tablet Take 325 mg by mouth 2 (two) times daily.     Marland Kitchen lisinopril-hydrochlorothiazide (PRINZIDE,ZESTORETIC) 20-12.5 MG tablet Take 1 tablet by mouth daily. 1 tablet 0  . metFORMIN (GLUCOPHAGE-XR) 500 MG 24 hr tablet Take 1 tablet (500 mg total) by mouth daily with breakfast. 30 tablet 11  . omeprazole (PRILOSEC) 40 MG capsule TAKE 1 CAPSULE(40 MG) BY MOUTH TWICE DAILY BEFORE A MEAL 180 capsule 3  . ONE TOUCH ULTRA TEST test strip TEST AS DIRECTED ONCE DAILY 100 each 0  . simvastatin (ZOCOR) 80 MG tablet Take 80 mg by mouth at bedtime.    . sucralfate (CARAFATE) 1 g tablet Take 1 g by mouth 4 (four) times daily as needed. For indigestion    . vitamin B-12 (CYANOCOBALAMIN) 1000 MCG tablet Take 1,000 mcg by mouth daily.     No current facility-administered medications on file prior to visit.     Allergies  Allergen Reactions  . Metformin     REACTION: diarrhea when dosage is increased, patient aware that she is onmetformin when it is listed that she is allergic  . Nifedipine     REACTION: cough    Past Medical History:  Diagnosis Date  . Anemia   . ARF (acute renal  failure) ([place])   . Blood transfusion without reported diagnosis   . Diabetes mellitus type II   . GI (gastrointestinal bleed)   . GI AVM (gastrointestinal arteriovenous vascular malformation)    stomach  . History of echocardiogram 12/2013   a. 12/2013: EF 60-65%, DD, mild LVH, nl RV size & systolic function, mild MR/TR, mild AS, nl RVSP; b. TTE 04/2017: EF 60-65%, normal wall motion, GR1DD, mild MR, left atrium normal in size, normal RV systolic function, PASP normal   . History of nuclear stress test    a. 12/2013: low risk, no sig WMA, nondiag EKG 2/2 baseline LVH w/ repol abnl, no sig ischemia, EF 70% no artifact  . HLD (hyperlipidemia)   . HTN (hypertension)   . Hypercholesterolemia   . IDA (iron deficiency anemia)   . Polyp of colon, adenomatous   . RLS (restless legs syndrome)     Past Surgical History:  Procedure Laterality Date  . ABDOMINAL ADHESION SURGERY  11/2001   Dr. Tamala Julian  . ABDOMINAL HYSTERECTOMY  1976   RSO (fibroid)  . COLONOSCOPY WITH PROPOFOL N/A 07/07/2016   Procedure: COLONOSCOPY WITH PROPOFOL;  Surgeon: Manya Silvas, MD;  Location: [facility] ENDOSCOPY;  Service: Endoscopy;  Laterality: N/A;  . COLONOSCOPY WITH PROPOFOL N/A 12/14/2016   Procedure: COLONOSCOPY WITH PROPOFOL;  Surgeon: Manya Silvas, MD;  Location: [facility] ENDOSCOPY;  Service: Endoscopy;  Laterality: N/A;  . DOBUTAMINE STRESS ECHO  11/10   normal  . DOPPLER ECHOCARDIOGRAPHY     EF 55%, mild MR, TR  . ESOPHAGOGASTRODUODENOSCOPY  07/2006   multiple angioectasias  . ESOPHAGOGASTRODUODENOSCOPY Left 03/12/2015   Procedure: place tube in throat and evaluate stomach and duodenum for source of bleeding. Cauterize if concern for rebleeding.;  Surgeon: Hulen Luster, MD;  Location: [facility] ENDOSCOPY;  Service: Endoscopy;  Laterality: Left;  . ESOPHAGOGASTRODUODENOSCOPY N/A 12/14/2016   Procedure: ESOPHAGOGASTRODUODENOSCOPY (EGD);  Surgeon: Manya Silvas, MD;  Location: [facility] ENDOSCOPY;  Service: Endoscopy;   Laterality: N/A;  . ESOPHAGOGASTRODUODENOSCOPY N/A 07/19/2017   Procedure: ESOPHAGOGASTRODUODENOSCOPY (EGD);  Surgeon: Lin Landsman, MD;  Location: [facility] ENDOSCOPY;  Service: Gastroenterology;  Laterality: N/A;  . ESOPHAGOGASTRODUODENOSCOPY (EGD) WITH PROPOFOL N/A 11/23/2015   Procedure: ESOPHAGOGASTRODUODENOSCOPY (EGD) WITH PROPOFOL;  Surgeon: Hulen Luster, MD;  Location: [facility] ENDOSCOPY;  Service: Gastroenterology;  Laterality: N/A;  . ESOPHAGOGASTRODUODENOSCOPY (EGD) WITH PROPOFOL N/A 07/07/2016   Procedure: ESOPHAGOGASTRODUODENOSCOPY (EGD) WITH PROPOFOL;  Surgeon: Manya Silvas, MD;  Location: [facility] ENDOSCOPY;  Service: Endoscopy;  Laterality: N/A;  . ESOPHAGOGASTRODUODENOSCOPY (EGD) WITH PROPOFOL N/A 05/19/2017   Procedure: ESOPHAGOGASTRODUODENOSCOPY (EGD) WITH PROPOFOL;  Surgeon: Manya Silvas, MD;  Location: [facility] ENDOSCOPY;  Service: Endoscopy;  Laterality: N/A;  . laryngeal polyp  10/2008   Dr. Tami Ribas  . TOP      Family History  Problem Relation Age of Onset  . Stroke Father   . Cancer Sister        breast cancer  . Hypertension Mother   . Hypertension Unknown        sibling  . Diabetes Unknown        grandmother    Social History   Socioeconomic History  . Marital status: Widowed    Spouse name: Not on file  . Number of children: 1  . Years of education: Not on file  . Highest education level: Not on file  Social Needs  . Financial resource strain: Not on file  . Food insecurity - worry: Not on file  . Food insecurity - inability: Not on file  . Transportation needs - medical: Not on file  . Transportation needs - non-medical: Not on file  Occupational History  . Occupation: Regulatory affairs officer    Comment:    Tobacco Use  . Smoking status: Current Every Day Smoker    Packs/day: 1.00    Types: Cigarettes  . Smokeless tobacco: Never Used  Substance and Sexual Activity  . Alcohol use: No  . Drug use: No  . Sexual activity: Not on file  Other Topics Concern  . Not  on file  Social History Narrative   No living will   Requests son Orson Gear as health care POA   Would accept resuscitation --but no prolonged machines   Not sure about feeding tubes   Review of Systems  Not sleeping great--not really RLS though Uses the clonazepam --it helps No really anxious or depressed      Objective:   Physical Exam  Constitutional: She appears well-developed. No distress.  Neck: No thyromegaly present.  Cardiovascular: Normal rate, regular rhythm and intact distal pulses. Exam reveals no gallop.  Gr 3/6 systolic murmur towards the base  Pulmonary/Chest: Effort normal and breath sounds normal. No respiratory distress. She has no wheezes. She has no rales.  Musculoskeletal: She exhibits no edema.  Lymphadenopathy:    She has no cervical  adenopathy.  Skin:  Cutaneous horn right forearm No foot lesions  Psychiatric: She has a normal mood and affect. Her behavior is normal.          Assessment & Plan:

## 2017-12-19 NOTE — Assessment & Plan Note (Signed)
BP Readings from Last 3 Encounters:  12/19/17 140/72  07/19/17 (!) 127/54  07/15/17 (!) 118/45   Good control

## 2017-12-19 NOTE — Assessment & Plan Note (Signed)
Still seems to have good control Will check A1c 

## 2017-12-19 NOTE — Assessment & Plan Note (Signed)
Has gained a few pounds Eating better with sister cooking

## 2017-12-29 ENCOUNTER — Ambulatory Visit
Admission: RE | Admit: 2017-12-29 | Discharge: 2017-12-29 | Disposition: A | Discharge status: Home / Self Care | Payer: Medicare Other | Source: Ambulatory Visit | Attending: Internal Medicine | Admitting: Internal Medicine

## 2017-12-29 DIAGNOSIS — R413 Other amnesia: Secondary | ICD-10-CM | POA: Diagnosis not present

## 2018-01-19 ENCOUNTER — Telehealth: Payer: Self-pay

## 2018-01-19 NOTE — Telephone Encounter (Signed)
Orthopedist would be best---but even appt in urgent care or here can evaluate. There is an ortho urgent care in [place]--but I don't think there is one in [place] Can add on here if really needs to be checked

## 2018-01-19 NOTE — Telephone Encounter (Signed)
Spoke to pt. She does not have a known injury. Kathryn Ware it has been this way for awhile. Says it is crooked.  Gave her info on [facility].

## 2018-01-19 NOTE — Telephone Encounter (Signed)
Copied from [place]. Topic: Referral - Request >> Jan 19, 2018  9:22 AM Scherrie Gerlach wrote: Reason for CRM: pt states she thinks her left hand thumb may be dislocated.  Would like to know where the dr would have her go to have this looked at. Pt declined to make an appt . Just wants to know where to go

## 2018-01-22 ENCOUNTER — Other Ambulatory Visit: Payer: Self-pay | Admitting: Internal Medicine

## 2018-01-22 NOTE — Telephone Encounter (Signed)
Last filled 12-19-17 #30 Last OV 12-19-17 No Future OV

## 2018-01-24 DIAGNOSIS — M65312 Trigger thumb, left thumb: Secondary | ICD-10-CM | POA: Diagnosis not present

## 2018-01-24 DIAGNOSIS — E119 Type 2 diabetes mellitus without complications: Secondary | ICD-10-CM | POA: Diagnosis not present

## 2018-01-26 ENCOUNTER — Other Ambulatory Visit: Payer: Self-pay | Admitting: Internal Medicine

## 2018-02-12 DIAGNOSIS — D485 Neoplasm of uncertain behavior of skin: Secondary | ICD-10-CM | POA: Diagnosis not present

## 2018-02-12 DIAGNOSIS — B079 Viral wart, unspecified: Secondary | ICD-10-CM | POA: Diagnosis not present

## 2018-02-26 ENCOUNTER — Other Ambulatory Visit: Payer: Self-pay | Admitting: Internal Medicine

## 2018-02-26 MED ORDER — CLONAZEPAM 0.5 MG PO TABS: ORAL_TABLET | ORAL | 0 refills | Status: DC

## 2018-02-26 NOTE — Telephone Encounter (Signed)
Copied from [place] [id number]. Topic: Quick Communication - Rx Refill/Question >> Feb 26, 2018  9:10 AM Arletha Grippe wrote: Medication: clonazePAM (KLONOPIN) 0.5 MG tablet Has the patient contacted their pharmacy? No. - controlled (Agent: If no, request that the patient contact the pharmacy for the refill.) Preferred Pharmacy (with phone number or street name): [facility]  Agent: Please be advised that RX refills may take up to 3 business days. We ask that you follow-up with your pharmacy.

## 2018-02-26 NOTE — Telephone Encounter (Signed)
Klonopin refill Last OV: 12/19/17 Last Refill:01/22/18 30 tab/0 refill Pharmacy: [facility] [place], [place] [phone] (Phone) [phone] (Fax)

## 2018-02-26 NOTE — Telephone Encounter (Signed)
Requesting refill klonopin to [facility]; Last refilled # 30 on 01/22/18 Last seen 12/19/17.  Dr Silvio Pate out of office until 03/01/18.Please advise.

## 2018-02-26 NOTE — Telephone Encounter (Signed)
Pt notified refill for klonopin to [facility]. Pt voiced understanding.

## 2018-03-20 ENCOUNTER — Telehealth: Payer: Self-pay | Admitting: Internal Medicine

## 2018-03-20 DIAGNOSIS — R413 Other amnesia: Secondary | ICD-10-CM

## 2018-03-20 NOTE — Telephone Encounter (Signed)
Spoke to pt. She would like to see a neurologist in [place].

## 2018-03-20 NOTE — Telephone Encounter (Signed)
Copied from [place] [id number]. Topic: Quick Communication - Rx Refill/Question >> Mar 20, 2018 11:03 AM Scherrie Gerlach wrote: Medication: a memory med  Pt states she spoke with the dr in Feb about memory loss, and requesting a med called in. Pt states she tried neuriva samples, and that worked, but does not have to be that. wants a rx  Pt declined to make an appt , stating she would like me to ask first.  [place], [address] AT [place] [phone] (Phone) [phone] (Fax)

## 2018-03-20 NOTE — Telephone Encounter (Signed)
I am not sure if there is any medication that will really help her memory loss--since I think it is from damage from the severe anemia. I can refer her to a neurologist if she wants to see a specialist---or she should come back in so we can discuss alternatives

## 2018-04-04 ENCOUNTER — Other Ambulatory Visit: Payer: Self-pay | Admitting: Internal Medicine

## 2018-04-04 NOTE — Telephone Encounter (Signed)
Last filled 02/26/2018 #30... Last OV 11/2017... Please advise

## 2018-04-05 ENCOUNTER — Other Ambulatory Visit: Payer: Self-pay

## 2018-04-05 NOTE — Patient Outreach (Signed)
[facility]) Care Management  04/05/2018  ANJALINA BERGEVIN [birth year] [id number]   Medication Adherence call to Mrs. Murriel Hopper spoke with patient she said she is only taking 1 tablet a dayof Lisinopril/Hctz 20/12.5 because when she takes two her blood pressure goes down patient is due for an appointment and will go over it with doctor.Mrs. Berkemeier is showing past due under [facility].on Lisinopril/Hctz 20/12.5.   [place] Management Direct Dial [phone]  Fax [phone] [email or web address]

## 2018-04-09 DIAGNOSIS — R413 Other amnesia: Secondary | ICD-10-CM | POA: Diagnosis not present

## 2018-04-09 DIAGNOSIS — G3184 Mild cognitive impairment, so stated: Secondary | ICD-10-CM | POA: Diagnosis not present

## 2018-04-20 ENCOUNTER — Telehealth: Payer: Self-pay | Admitting: Internal Medicine

## 2018-04-20 NOTE — Telephone Encounter (Signed)
I think we should discuss this in the office I think her pattern is more like vascular dementia, not Alzheimers, so I am not sure that the aricept is likely to help much (but we could consider it after we discuss it some)

## 2018-04-20 NOTE — Telephone Encounter (Signed)
Pt last seen 12/19/17 and memory loss noted on problem list 12/19/17

## 2018-04-20 NOTE — Telephone Encounter (Signed)
Copied from [place] [id number]. Topic: Quick Communication - See Telephone Encounter >> Apr 20, 2018  3:56 PM Rutherford Nail, NT wrote: CRM for notification. See Telephone encounter for: 04/20/18. Patient calling to see if Dr Silvio Pate could prescribe Aricept for alzheimer's? Please advise. CB#: [phone] [id number] [place], [address] [place]

## 2018-04-23 NOTE — Telephone Encounter (Signed)
Spoke to pt. She is not interested in making an office visit right now. She will call and schedule an OV if she decides she wants to discuss her options.

## 2018-04-25 ENCOUNTER — Ambulatory Visit: Payer: Medicare Other | Admitting: Internal Medicine

## 2018-05-02 ENCOUNTER — Ambulatory Visit: Payer: Medicare Other | Admitting: Internal Medicine

## 2018-05-15 ENCOUNTER — Other Ambulatory Visit: Payer: Self-pay | Admitting: Internal Medicine

## 2018-05-15 NOTE — Telephone Encounter (Signed)
Copied from [place] [id number]. Topic: Quick Communication - Rx Refill/Question >> May 15, 2018  9:13 AM Gardiner Ramus wrote: Medication:clonazePAM (KLONOPIN) 0.5 MG tablet  Has the patient contacted their pharmacy? no Preferred Pharmacy (with phone number or street name): [facility] [place], [address] AT [place] [phone] (Phone) [phone] (Fax)  Agent: Please be advised that RX refills may take up to 3 business days. We ask that you follow-up with your pharmacy.

## 2018-05-16 NOTE — Telephone Encounter (Signed)
Rx refill request:   Klonopin 0.5 mg         Last filled: 04/07/18   LOV: 12/19/17  PCP: [place]: verified

## 2018-05-17 MED ORDER — CLONAZEPAM 0.5 MG PO TABS: ORAL_TABLET | ORAL | 0 refills | Status: DC

## 2018-05-17 NOTE — Telephone Encounter (Signed)
Name of Medication: clonazepam 0.5 mg Name of Pharmacy:[facility] Last Fill or Written Date and Quantity: # 30 on 04/04/18 Last Office Visit and Type: 12/19/17 6 mth f/u Next Office Visit and Type: 06/05/18

## 2018-06-05 ENCOUNTER — Encounter: Payer: Self-pay | Admitting: Internal Medicine

## 2018-06-05 ENCOUNTER — Ambulatory Visit: Payer: Medicare Other | Admitting: Internal Medicine

## 2018-06-05 VITALS — BP 106/60 | HR 98 | Temp 97.6°F | Ht 64.0 in | Wt 118.0 lb

## 2018-06-05 DIAGNOSIS — R413 Other amnesia: Secondary | ICD-10-CM

## 2018-06-05 MED ORDER — DONEPEZIL HCL 5 MG PO TABS: 5.0000 mg | ORAL_TABLET | Freq: Every day | ORAL | 3 refills | Status: DC

## 2018-06-05 NOTE — Assessment & Plan Note (Signed)
Vs early Alzheimers Discussed mental and physical stimulation Will try donepezil 5mg  Keep neurology follow up

## 2018-06-05 NOTE — Patient Instructions (Addendum)
Please start the donepezil daily and let me know if you have any problems. You need to start a regular exercise program. You should get out with other people as much as possible. Please try to cut back on your smoking.

## 2018-06-05 NOTE — Progress Notes (Signed)
Subjective:    Patient ID: Kathryn Ware, female    DOB: 23-Jan-1941, 77 y.o.   MRN: [id number]  HPI Here with sisters for follow up about memory loss  Ongoing problems in car---no sense of direction and doesn't know where she is Remains independent in the house Sister brings her for groceries and other shopping Occasionally misplaces things in house Sister is now living with her They are interested in trying medications She asks about neuriva supplement (not sure what is in it)  Reviewed all labs and MRI--nothing striking Reviewed neurology note  Current Outpatient Medications on File Prior to Visit  Medication Sig Dispense Refill  . clonazePAM (KLONOPIN) 0.5 MG tablet TAKE 1 TABLET(0.5 MG) BY MOUTH DAILY AS NEEDED 30 tablet 0  . ferrous sulfate 325 (65 FE) MG tablet Take 325 mg by mouth 2 (two) times daily.     Marland Kitchen lisinopril-hydrochlorothiazide (PRINZIDE,ZESTORETIC) 20-12.5 MG tablet Take 1 tablet by mouth daily. 1 tablet 0  . metFORMIN (GLUCOPHAGE-XR) 500 MG 24 hr tablet Take 1 tablet (500 mg total) by mouth daily with breakfast. 30 tablet 11  . omeprazole (PRILOSEC) 40 MG capsule TAKE 1 CAPSULE(40 MG) BY MOUTH TWICE DAILY BEFORE A MEAL 180 capsule 3  . ONE TOUCH ULTRA TEST test strip TEST ONCE DAILY AS DIRECTED 100 each 3  . simvastatin (ZOCOR) 80 MG tablet Take 80 mg by mouth at bedtime.    . sucralfate (CARAFATE) 1 g tablet Take 1 g by mouth 4 (four) times daily as needed. For indigestion    . vitamin B-12 (CYANOCOBALAMIN) 1000 MCG tablet Take 1,000 mcg by mouth daily.     No current facility-administered medications on file prior to visit.     Allergies  Allergen Reactions  . Metformin     REACTION: diarrhea when dosage is increased, patient aware that she is onmetformin when it is listed that she is allergic  . Nifedipine     REACTION: cough    Past Medical History:  Diagnosis Date  . Anemia   . ARF (acute renal failure) ([place])   . Blood transfusion without  reported diagnosis   . Diabetes mellitus type II   . GI (gastrointestinal bleed)   . GI AVM (gastrointestinal arteriovenous vascular malformation)    stomach  . History of echocardiogram 12/2013   a. 12/2013: EF 60-65%, DD, mild LVH, nl RV size & systolic function, mild MR/TR, mild AS, nl RVSP; b. TTE 04/2017: EF 60-65%, normal wall motion, GR1DD, mild MR, left atrium normal in size, normal RV systolic function, PASP normal   . History of nuclear stress test    a. 12/2013: low risk, no sig WMA, nondiag EKG 2/2 baseline LVH w/ repol abnl, no sig ischemia, EF 70% no artifact  . HLD (hyperlipidemia)   . HTN (hypertension)   . Hypercholesterolemia   . IDA (iron deficiency anemia)   . Polyp of colon, adenomatous   . RLS (restless legs syndrome)     Past Surgical History:  Procedure Laterality Date  . ABDOMINAL ADHESION SURGERY  11/2001   Dr. Tamala Julian  . ABDOMINAL HYSTERECTOMY  1976   RSO (fibroid)  . COLONOSCOPY WITH PROPOFOL N/A 07/07/2016   Procedure: COLONOSCOPY WITH PROPOFOL;  Surgeon: Manya Silvas, MD;  Location: [facility] ENDOSCOPY;  Service: Endoscopy;  Laterality: N/A;  . COLONOSCOPY WITH PROPOFOL N/A 12/14/2016   Procedure: COLONOSCOPY WITH PROPOFOL;  Surgeon: Manya Silvas, MD;  Location: [facility] ENDOSCOPY;  Service: Endoscopy;  Laterality: N/A;  .  DOBUTAMINE STRESS ECHO  11/10   normal  . DOPPLER ECHOCARDIOGRAPHY     EF 55%, mild MR, TR  . ESOPHAGOGASTRODUODENOSCOPY  07/2006   multiple angioectasias  . ESOPHAGOGASTRODUODENOSCOPY Left 03/12/2015   Procedure: place tube in throat and evaluate stomach and duodenum for source of bleeding. Cauterize if concern for rebleeding.;  Surgeon: Hulen Luster, MD;  Location: [facility] ENDOSCOPY;  Service: Endoscopy;  Laterality: Left;  . ESOPHAGOGASTRODUODENOSCOPY N/A 12/14/2016   Procedure: ESOPHAGOGASTRODUODENOSCOPY (EGD);  Surgeon: Manya Silvas, MD;  Location: [facility] ENDOSCOPY;  Service: Endoscopy;  Laterality: N/A;  . ESOPHAGOGASTRODUODENOSCOPY N/A  07/19/2017   Procedure: ESOPHAGOGASTRODUODENOSCOPY (EGD);  Surgeon: Lin Landsman, MD;  Location: [facility] ENDOSCOPY;  Service: Gastroenterology;  Laterality: N/A;  . ESOPHAGOGASTRODUODENOSCOPY (EGD) WITH PROPOFOL N/A 11/23/2015   Procedure: ESOPHAGOGASTRODUODENOSCOPY (EGD) WITH PROPOFOL;  Surgeon: Hulen Luster, MD;  Location: [facility] ENDOSCOPY;  Service: Gastroenterology;  Laterality: N/A;  . ESOPHAGOGASTRODUODENOSCOPY (EGD) WITH PROPOFOL N/A 07/07/2016   Procedure: ESOPHAGOGASTRODUODENOSCOPY (EGD) WITH PROPOFOL;  Surgeon: Manya Silvas, MD;  Location: [facility] ENDOSCOPY;  Service: Endoscopy;  Laterality: N/A;  . ESOPHAGOGASTRODUODENOSCOPY (EGD) WITH PROPOFOL N/A 05/19/2017   Procedure: ESOPHAGOGASTRODUODENOSCOPY (EGD) WITH PROPOFOL;  Surgeon: Manya Silvas, MD;  Location: [facility] ENDOSCOPY;  Service: Endoscopy;  Laterality: N/A;  . laryngeal polyp  10/2008   Dr. Tami Ribas  . TOP      Family History  Problem Relation Age of Onset  . Stroke Father   . Cancer Sister        breast cancer  . Hypertension Mother   . Hypertension Unknown        sibling  . Diabetes Unknown        grandmother    Social History   Socioeconomic History  . Marital status: Widowed    Spouse name: Not on file  . Number of children: 1  . Years of education: Not on file  . Highest education level: Not on file  Occupational History  . 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 Financial resource strain: Not on file  . Food insecurity:    Worry: Not on file    Inability: Not on file  . Transportation needs:    Medical: Not on file    Non-medical: Not on file  Tobacco Use  . Smoking status: Current Every Day Smoker    Packs/day: 1.00    Types: Cigarettes  . Smokeless tobacco: Never Used  Substance and Sexual Activity  . Alcohol use: No  . Drug use: No  . Sexual activity: Not on file  Lifestyle  . Physical activity:    Days per week: Not on file    Minutes per session: Not on file  .  Stress: Not on file  Relationships  . Social connections:    Talks on phone: Not on file    Gets together: Not on file    Attends religious service: Not on file    Active member of club or organization: Not on file    Attends meetings of clubs or organizations: Not on file    Relationship status: Not on file  . Intimate partner violence:    Fear of current or ex partner: Not on file    Emotionally abused: Not on file    Physically abused: Not on file    Forced sexual activity: Not on file  Other Topics Concern  . Not on file  Social History Narrative   No living will  Requests son Orson Gear as health care POA   Would accept resuscitation --but no prolonged machines   Not sure about feeding tubes   Review of Systems Sleeps okay Appetite is good Weight is stable    Objective:   Physical Exam  Constitutional: No distress.  Psychiatric: She has a normal mood and affect. Her behavior is normal.           Assessment & Plan:

## 2018-06-07 ENCOUNTER — Emergency Department: Payer: Medicare Other

## 2018-06-07 ENCOUNTER — Other Ambulatory Visit: Payer: Self-pay

## 2018-06-07 ENCOUNTER — Inpatient Hospital Stay
Admission: EM | Admit: 2018-06-07 | Discharge: 2018-06-09 | DRG: 348 | Disposition: A | Discharge status: Home / Self Care | Payer: Medicare Other | Attending: Internal Medicine | Admitting: Internal Medicine

## 2018-06-07 DIAGNOSIS — I5032 Chronic diastolic (congestive) heart failure: Secondary | ICD-10-CM | POA: Diagnosis present

## 2018-06-07 DIAGNOSIS — R079 Chest pain, unspecified: Secondary | ICD-10-CM | POA: Diagnosis not present

## 2018-06-07 DIAGNOSIS — E1151 Type 2 diabetes mellitus with diabetic peripheral angiopathy without gangrene: Secondary | ICD-10-CM | POA: Diagnosis present

## 2018-06-07 DIAGNOSIS — I110 Hypertensive heart disease with heart failure: Secondary | ICD-10-CM | POA: Diagnosis present

## 2018-06-07 DIAGNOSIS — F17210 Nicotine dependence, cigarettes, uncomplicated: Secondary | ICD-10-CM | POA: Diagnosis present

## 2018-06-07 DIAGNOSIS — I959 Hypotension, unspecified: Secondary | ICD-10-CM | POA: Diagnosis present

## 2018-06-07 DIAGNOSIS — K31811 Angiodysplasia of stomach and duodenum with bleeding: Secondary | ICD-10-CM | POA: Diagnosis not present

## 2018-06-07 DIAGNOSIS — Z7984 Long term (current) use of oral hypoglycemic drugs: Secondary | ICD-10-CM | POA: Diagnosis not present

## 2018-06-07 DIAGNOSIS — M6281 Muscle weakness (generalized): Secondary | ICD-10-CM | POA: Diagnosis not present

## 2018-06-07 DIAGNOSIS — E860 Dehydration: Secondary | ICD-10-CM | POA: Diagnosis present

## 2018-06-07 DIAGNOSIS — K449 Diaphragmatic hernia without obstruction or gangrene: Secondary | ICD-10-CM | POA: Diagnosis present

## 2018-06-07 DIAGNOSIS — D62 Acute posthemorrhagic anemia: Secondary | ICD-10-CM | POA: Diagnosis not present

## 2018-06-07 DIAGNOSIS — Z90710 Acquired absence of both cervix and uterus: Secondary | ICD-10-CM

## 2018-06-07 DIAGNOSIS — I700 Atherosclerosis of aorta: Secondary | ICD-10-CM | POA: Diagnosis present

## 2018-06-07 DIAGNOSIS — E7800 Pure hypercholesterolemia, unspecified: Secondary | ICD-10-CM | POA: Diagnosis present

## 2018-06-07 DIAGNOSIS — K922 Gastrointestinal hemorrhage, unspecified: Secondary | ICD-10-CM | POA: Diagnosis not present

## 2018-06-07 DIAGNOSIS — K31819 Angiodysplasia of stomach and duodenum without bleeding: Secondary | ICD-10-CM | POA: Diagnosis not present

## 2018-06-07 DIAGNOSIS — Z86010 Personal history of colonic polyps: Secondary | ICD-10-CM

## 2018-06-07 DIAGNOSIS — Z888 Allergy status to other drugs, medicaments and biological substances status: Secondary | ICD-10-CM | POA: Diagnosis not present

## 2018-06-07 DIAGNOSIS — G2581 Restless legs syndrome: Secondary | ICD-10-CM | POA: Diagnosis present

## 2018-06-07 DIAGNOSIS — K5521 Angiodysplasia of colon with hemorrhage: Secondary | ICD-10-CM | POA: Diagnosis not present

## 2018-06-07 DIAGNOSIS — R000 Tachycardia, unspecified: Secondary | ICD-10-CM | POA: Diagnosis present

## 2018-06-07 DIAGNOSIS — Z720 Tobacco use: Secondary | ICD-10-CM | POA: Diagnosis not present

## 2018-06-07 DIAGNOSIS — Z716 Tobacco abuse counseling: Secondary | ICD-10-CM

## 2018-06-07 DIAGNOSIS — Z79899 Other long term (current) drug therapy: Secondary | ICD-10-CM

## 2018-06-07 DIAGNOSIS — K921 Melena: Secondary | ICD-10-CM | POA: Diagnosis not present

## 2018-06-07 DIAGNOSIS — R5383 Other fatigue: Secondary | ICD-10-CM | POA: Diagnosis not present

## 2018-06-07 DIAGNOSIS — E785 Hyperlipidemia, unspecified: Secondary | ICD-10-CM | POA: Diagnosis present

## 2018-06-07 DIAGNOSIS — D649 Anemia, unspecified: Secondary | ICD-10-CM | POA: Diagnosis not present

## 2018-06-07 DIAGNOSIS — D509 Iron deficiency anemia, unspecified: Secondary | ICD-10-CM | POA: Diagnosis not present

## 2018-06-07 DIAGNOSIS — R531 Weakness: Secondary | ICD-10-CM | POA: Diagnosis not present

## 2018-06-07 DIAGNOSIS — R0602 Shortness of breath: Secondary | ICD-10-CM | POA: Diagnosis not present

## 2018-06-07 DIAGNOSIS — D500 Iron deficiency anemia secondary to blood loss (chronic): Secondary | ICD-10-CM | POA: Diagnosis not present

## 2018-06-07 DIAGNOSIS — Z8249 Family history of ischemic heart disease and other diseases of the circulatory system: Secondary | ICD-10-CM

## 2018-06-07 LAB — BASIC METABOLIC PANEL
ANION GAP: 7 (ref 5–15)
BUN: 39 mg/dL — ABNORMAL HIGH (ref 8–23)
CALCIUM: 9.6 mg/dL (ref 8.9–10.3)
CO2: 23 mmol/L (ref 22–32)
Chloride: 110 mmol/L (ref 98–111)
Creatinine, Ser: 1.1 mg/dL — ABNORMAL HIGH (ref 0.44–1.00)
GFR, EST AFRICAN AMERICAN: 55 mL/min — AB (ref >60)
GFR, EST NON AFRICAN AMERICAN: 48 mL/min — AB (ref >60)
Glucose, Bld: 156 mg/dL — ABNORMAL HIGH (ref 70–99)
Potassium: 4.2 mmol/L (ref 3.5–5.1)
SODIUM: 140 mmol/L (ref 135–145)

## 2018-06-07 LAB — CBC
HEMATOCRIT: 19.4 % — AB (ref 35.0–47.0)
HEMOGLOBIN: 6.5 g/dL — AB (ref 12.0–16.0)
MCH: 29.2 pg (ref 26.0–34.0)
MCHC: 33.7 g/dL (ref 32.0–36.0)
MCV: 86.6 fL (ref 80.0–100.0)
Platelets: 226 10*3/uL (ref 150–440)
RBC: 2.24 MIL/uL — ABNORMAL LOW (ref 3.80–5.20)
RDW: 15.2 % — ABNORMAL HIGH (ref 11.5–14.5)
WBC: 7.4 10*3/uL (ref 3.6–11.0)

## 2018-06-07 LAB — GLUCOSE, CAPILLARY
Glucose-Capillary: 138 mg/dL — ABNORMAL HIGH (ref 70–99)
Glucose-Capillary: 105 mg/dL — ABNORMAL HIGH (ref 70–99)

## 2018-06-07 LAB — TROPONIN I: TROPONIN I: 0.03 ng/mL — AB (ref <0.03)

## 2018-06-07 LAB — PREPARE RBC (CROSSMATCH)

## 2018-06-07 MED ORDER — ONDANSETRON HCL 4 MG PO TABS: 4.0000 mg | ORAL_TABLET | Freq: Four times a day (QID) | ORAL | Status: DC | PRN

## 2018-06-07 MED ORDER — SUCRALFATE 1 G PO TABS
1.0000 g | ORAL_TABLET | Freq: Three times a day (TID) | ORAL | Status: DC
  Administered 2018-06-07 – 2018-06-09 (×5): 1 g via ORAL
  Filled 2018-06-07 (×5): qty 1

## 2018-06-07 MED ORDER — DONEPEZIL HCL 5 MG PO TABS
5.0000 mg | ORAL_TABLET | Freq: Every day | ORAL | Status: DC
  Administered 2018-06-07 – 2018-06-08 (×2): 5 mg via ORAL
  Filled 2018-06-07 (×3): qty 1

## 2018-06-07 MED ORDER — VITAMIN B-12 1000 MCG PO TABS
1000.0000 ug | ORAL_TABLET | Freq: Every day | ORAL | Status: DC
  Administered 2018-06-08 – 2018-06-09 (×2): 1000 ug via ORAL
  Filled 2018-06-07 (×2): qty 1

## 2018-06-07 MED ORDER — SENNOSIDES-DOCUSATE SODIUM 8.6-50 MG PO TABS: 1.0000 | ORAL_TABLET | Freq: Every evening | ORAL | Status: DC | PRN

## 2018-06-07 MED ORDER — FERROUS SULFATE 325 (65 FE) MG PO TABS
325.0000 mg | ORAL_TABLET | Freq: Two times a day (BID) | ORAL | Status: DC
  Administered 2018-06-07 – 2018-06-08 (×2): 325 mg via ORAL
  Filled 2018-06-07 (×2): qty 1

## 2018-06-07 MED ORDER — SODIUM CHLORIDE 0.9 % IV SOLN
1000.0000 mL | Freq: Once | INTRAVENOUS | Status: AC
  Administered 2018-06-07: 1000 mL via INTRAVENOUS

## 2018-06-07 MED ORDER — CLONAZEPAM 0.5 MG PO TABS: 0.5000 mg | ORAL_TABLET | Freq: Every day | ORAL | Status: DC | PRN

## 2018-06-07 MED ORDER — ALBUTEROL SULFATE (2.5 MG/3ML) 0.083% IN NEBU: 2.5000 mg | INHALATION_SOLUTION | RESPIRATORY_TRACT | Status: DC | PRN

## 2018-06-07 MED ORDER — INSULIN ASPART 100 UNIT/ML ~~LOC~~ SOLN: 0.0000 [IU] | Freq: Three times a day (TID) | SUBCUTANEOUS | Status: DC

## 2018-06-07 MED ORDER — FAMOTIDINE IN NACL 20-0.9 MG/50ML-% IV SOLN
20.0000 mg | Freq: Two times a day (BID) | INTRAVENOUS | Status: DC
  Administered 2018-06-07 – 2018-06-08 (×2): 20 mg via INTRAVENOUS
  Filled 2018-06-07 (×2): qty 50

## 2018-06-07 MED ORDER — SODIUM CHLORIDE 0.9 % IV SOLN
10.0000 mL/h | Freq: Once | INTRAVENOUS | Status: AC
  Administered 2018-06-07: 10 mL/h via INTRAVENOUS

## 2018-06-07 MED ORDER — ATORVASTATIN CALCIUM 20 MG PO TABS: 40.0000 mg | ORAL_TABLET | Freq: Every day | ORAL | Status: DC

## 2018-06-07 MED ORDER — BISACODYL 5 MG PO TBEC: 5.0000 mg | DELAYED_RELEASE_TABLET | Freq: Every day | ORAL | Status: DC | PRN

## 2018-06-07 MED ORDER — ACETAMINOPHEN 650 MG RE SUPP: 650.0000 mg | Freq: Four times a day (QID) | RECTAL | Status: DC | PRN

## 2018-06-07 MED ORDER — ONDANSETRON HCL 4 MG/2ML IJ SOLN
4.0000 mg | Freq: Four times a day (QID) | INTRAMUSCULAR | Status: DC | PRN
  Administered 2018-06-08: 4 mg via INTRAVENOUS

## 2018-06-07 MED ORDER — ACETAMINOPHEN 325 MG PO TABS: 650.0000 mg | ORAL_TABLET | Freq: Four times a day (QID) | ORAL | Status: DC | PRN

## 2018-06-07 MED ORDER — POTASSIUM CHLORIDE IN NACL 20-0.9 MEQ/L-% IV SOLN
INTRAVENOUS | Status: DC
  Administered 2018-06-07: 23:00:00 via INTRAVENOUS
  Filled 2018-06-07 (×4): qty 1000

## 2018-06-07 MED ORDER — HYDROCODONE-ACETAMINOPHEN 5-325 MG PO TABS: 1.0000 | ORAL_TABLET | ORAL | Status: DC | PRN

## 2018-06-07 MED ORDER — INSULIN ASPART 100 UNIT/ML ~~LOC~~ SOLN: 0.0000 [IU] | Freq: Every day | SUBCUTANEOUS | Status: DC

## 2018-06-07 NOTE — Progress Notes (Signed)
Advanced Care Plan.  Purpose of Encounter: CODE STATUS. Parties in Attendance: The patient and me. Patient's Decisional Capacity: Yes. Medical Story: Kathryn Ware  is a 77 y.o. female with a known history of GI bleeding, AVM, anemia, hypertension, diabetes, hyperlipidemia and RLS.    She is being admitted for symptomatic anemia and GI bleeding.  I discussed with the patient about patient's current condition, prognosis and the colder status.  The patient said she wants to be resuscitated and intubated if necessary.  Plan:  Code Status: Full code. Time spent discussing advance care planning; 17 minutes.

## 2018-06-07 NOTE — ED Notes (Signed)
Blood bank called and informed to add on the patient's type and screen.  Lab verbalized understanding of this add-on.

## 2018-06-07 NOTE — ED Notes (Signed)
Blood transfusion consent obtained with the EMR system.  Patient educated about side effects, signs to watch for, and risks.

## 2018-06-07 NOTE — ED Triage Notes (Signed)
Pt comes into the ED via EMS from home with c/o generalized weakness with chest pain and SOB. States she feels like this when her blood is low and has to have a transfusion.

## 2018-06-07 NOTE — ED Notes (Signed)
ED Provider at bedside. 

## 2018-06-07 NOTE — ED Notes (Signed)
Date and time results received: 06/07/18 2:41 PM   Test: Troponin Critical Value: 0.03 ng/mL  Name of Provider Notified: Dr. Corky Downs

## 2018-06-07 NOTE — ED Notes (Signed)
Hemoccult POSITIVE 

## 2018-06-07 NOTE — ED Provider Notes (Signed)
[facility] Emergency Department Provider Note   ____________________________________________    I have reviewed the triage vital signs and the nursing notes.   HISTORY  Chief Complaint Dizziness and weakness    HPI Kathryn Ware is a 77 y.o. female with a history of diabetes, presents with complaints of weakness, fatigue, dizziness over the last several days.  She reports she feels similar to when she needed a blood transfusion in the past.  She has had intermittent chest tightness as well.  Denies fevers or chills or cough.  No shortness of breath.  No nausea or vomiting.  She reports her stools have been normal.  Review of medical records demonstrates a history of duodenal AVM in the past she is not on blood thinners   Past Medical History:  Diagnosis Date  . Anemia   . ARF (acute renal failure) ([place])   . Blood transfusion without reported diagnosis   . Diabetes mellitus type II   . GI (gastrointestinal bleed)   . GI AVM (gastrointestinal arteriovenous vascular malformation)    stomach  . History of echocardiogram 12/2013   a. 12/2013: EF 60-65%, DD, mild LVH, nl RV size & systolic function, mild MR/TR, mild AS, nl RVSP; b. TTE 04/2017: EF 60-65%, normal wall motion, GR1DD, mild MR, left atrium normal in size, normal RV systolic function, PASP normal   . History of nuclear stress test    a. 12/2013: low risk, no sig WMA, nondiag EKG 2/2 baseline LVH w/ repol abnl, no sig ischemia, EF 70% no artifact  . HLD (hyperlipidemia)   . HTN (hypertension)   . Hypercholesterolemia   . IDA (iron deficiency anemia)   . Polyp of colon, adenomatous   . RLS (restless legs syndrome)     Patient Active Problem List   Diagnosis Date Noted  . GIB (gastrointestinal bleeding) 06/07/2018  . Aortic atherosclerosis ([place]) 12/19/2017  . Memory loss 12/19/2017  . GI bleed 07/18/2017  . Exertional chest pain 07/14/2017  . Dyspnea on exertion 07/14/2017  . Chest pain  07/14/2017  . Chronic diastolic CHF (congestive heart failure) ([place]) 05/31/2017  . Abnormal EKG 05/11/2017  . Symptomatic anemia 05/10/2017  . Malnutrition of mild degree ([place]) 03/30/2017  . Anemia 07/05/2016  . Restless legs syndrome (RLS) 02/23/2016  . 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 Anemia due to gastrointestinal blood loss 02/18/2015  . Advance directive discussed with patient 02/18/2015  . Atypical chest pain 01/14/2014  . Nicotine dependence 10/29/2013  . Routine general medical examination at a health care facility 08/17/2011  . Mild cognitive impairment 08/17/2011  . Diabetes mellitus with complication ([place]) 63/33/5456  . HYPERCHOLESTEROLEMIA 02/02/2007  . Essential hypertension 02/02/2007  . Angiodysplasia of intestinal tract 02/02/2007    Past Surgical History:  Procedure Laterality Date  . ABDOMINAL ADHESION SURGERY  11/2001   Dr. Tamala Julian  . ABDOMINAL HYSTERECTOMY  1976   RSO (fibroid)  . COLONOSCOPY WITH PROPOFOL N/A 07/07/2016   Procedure: COLONOSCOPY WITH PROPOFOL;  Surgeon: Manya Silvas, MD;  Location: [facility] ENDOSCOPY;  Service: Endoscopy;  Laterality: N/A;  . COLONOSCOPY WITH PROPOFOL N/A 12/14/2016   Procedure: COLONOSCOPY WITH PROPOFOL;  Surgeon: Manya Silvas, MD;  Location: [facility] ENDOSCOPY;  Service: Endoscopy;  Laterality: N/A;  . DOBUTAMINE STRESS ECHO  11/10   normal  . DOPPLER ECHOCARDIOGRAPHY     EF 55%, mild MR, TR  . ESOPHAGOGASTRODUODENOSCOPY  07/2006   multiple angioectasias  . ESOPHAGOGASTRODUODENOSCOPY Left 03/12/2015  Procedure: place tube in throat and evaluate stomach and duodenum for source of bleeding. Cauterize if concern for rebleeding.;  Surgeon: Hulen Luster, MD;  Location: [facility] ENDOSCOPY;  Service: Endoscopy;  Laterality: Left;  . ESOPHAGOGASTRODUODENOSCOPY N/A 12/14/2016   Procedure: ESOPHAGOGASTRODUODENOSCOPY (EGD);  Surgeon: Manya Silvas, MD;  Location: [facility] ENDOSCOPY;  Service: Endoscopy;  Laterality: N/A;  .  ESOPHAGOGASTRODUODENOSCOPY N/A 07/19/2017   Procedure: ESOPHAGOGASTRODUODENOSCOPY (EGD);  Surgeon: Lin Landsman, MD;  Location: [facility] ENDOSCOPY;  Service: Gastroenterology;  Laterality: N/A;  . ESOPHAGOGASTRODUODENOSCOPY (EGD) WITH PROPOFOL N/A 11/23/2015   Procedure: ESOPHAGOGASTRODUODENOSCOPY (EGD) WITH PROPOFOL;  Surgeon: Hulen Luster, MD;  Location: [facility] ENDOSCOPY;  Service: Gastroenterology;  Laterality: N/A;  . ESOPHAGOGASTRODUODENOSCOPY (EGD) WITH PROPOFOL N/A 07/07/2016   Procedure: ESOPHAGOGASTRODUODENOSCOPY (EGD) WITH PROPOFOL;  Surgeon: Manya Silvas, MD;  Location[facility] ENDOSCOPY;  Service: Endoscopy;  Laterality: N/A;  . ESOPHAGOGASTRODUODENOSCOPY (EGD) WITH PROPOFOL N/A 05/19/2017   Procedure: ESOPHAGOGASTRODUODENOSCOPY (EGD) WITH PROPOFOL;  Surgeon: Manya Silvas, MD;  Location: [facility] ENDOSCOPY;  Service: Endoscopy;  Laterality: N/A;  . laryngeal polyp  10/2008   Dr. Tami Ribas  . TOP      Prior to Admission medications   Medication Sig Start Date End Date Taking? Authorizing Provider  clonazePAM (KLONOPIN) 0.5 MG tablet TAKE 1 TABLET(0.5 MG) BY MOUTH DAILY AS NEEDED 05/17/18  Yes Baity, Coralie Keens, NP  donepezil (ARICEPT) 5 MG tablet Take 1 tablet (5 mg total) by mouth at bedtime. 06/05/18  Yes Viviana Simpler I, MD  ferrous sulfate 325 (65 FE) MG tablet Take 325 mg by mouth 2 (two) times daily.    Yes [provider]  lisinopril-hydrochlorothiazide (PRINZIDE,ZESTORETIC) 20-12.5 MG tablet Take 1 tablet by mouth daily. Patient taking differently: Take 2 tablets by mouth daily.  07/20/16  Yes Venia Carbon, MD  metFORMIN (GLUCOPHAGE-XR) 500 MG 24 hr tablet Take 1 tablet (500 mg total) by mouth daily with breakfast. 11/06/17  Yes Venia Carbon, MD  omeprazole (PRILOSEC) 40 MG capsule TAKE 1 CAPSULE(40 MG) BY MOUTH TWICE DAILY BEFORE A MEAL 08/21/17  Yes Venia Carbon, MD  simvastatin (ZOCOR) 80 MG tablet Take 80 mg by mouth at bedtime.   Yes [provider]   sucralfate (CARAFATE) 1 g tablet Take 1 g by mouth 4 (four) times daily as needed. For indigestion   Yes [provider]  vitamin B-12 (CYANOCOBALAMIN) 1000 MCG tablet Take 1,000 mcg by mouth daily.   Yes [provider]  ONE TOUCH ULTRA TEST test strip TEST ONCE DAILY AS DIRECTED 01/26/18   Venia Carbon, MD     Allergies Metformin and Nifedipine  Family History  Problem Relation Age of Onset  . Stroke Father   . Cancer Sister        breast cancer  . Hypertension Mother   . Hypertension Unknown        sibling  . Diabetes Unknown        grandmother    Social History Social History   Tobacco Use  . Smoking status: Current Every Day Smoker    Packs/day: 1.00    Types: Cigarettes  . Smokeless tobacco: Never Used  Substance Use Topics  . Alcohol use: No  . Drug use: No    Review of Systems  Constitutional: No fever/chills Eyes: No visual changes.  ENT: No sore throat. Cardiovascular: As above Respiratory: Denies shortness of breath. Gastrointestinal: As above Genitourinary: Negative for dysuria. Musculoskeletal: Negative for back pain. Skin: Negative for  rash. Neurological: Negative for headaches or weakness   ____________________________________________   PHYSICAL EXAM:  VITAL SIGNS: ED Triage Vitals  Enc Vitals Group     BP 06/07/18 1401 (!) 88/48     Pulse Rate 06/07/18 1401 (!) 122     Resp 06/07/18 1401 (!) 24     Temp 06/07/18 1401 97.6 F (36.4 C)     Temp Source 06/07/18 1401 Oral     SpO2 06/07/18 1401 100 %     Weight 06/07/18 1402 50.8 kg (112 lb)     Height 06/07/18 1402 1.626 m (5\' 4" )     Head Circumference --      Peak Flow --      Pain Score 06/07/18 1402 0     Pain Loc --      Pain Edu? --      Excl. in [place]? --     Constitutional: Alert and oriented. No acute distress. Pleasant and interactive Eyes: Conjunctivae are pale . Nose: No congestion/rhinnorhea. Mouth/Throat: Mucous membranes are moist.     Cardiovascular: Significant tachycardia, regular rhythm. Grossly normal heart sounds.  Good peripheral circulation. Respiratory: Normal respiratory effort.  No retractions.  Gastrointestinal: Soft and nontender. No distention.  On rectal exam brown stool guaiac positive  Musculoskeletal: No lower extremity tenderness nor edema.  Warm and well perfused Neurologic:  Normal speech and language. No gross focal neurologic deficits are appreciated.  Skin:  Skin is warm, dry and intact. No rash noted. Psychiatric: Mood and affect are normal. Speech and behavior are normal.  ____________________________________________   LABS (all labs ordered are listed, but only abnormal results are displayed)  Labs Reviewed  BASIC METABOLIC PANEL - Abnormal; Notable for the following components:      Result Value   Glucose, Bld 156 (*)    BUN 39 (*)    Creatinine, Ser 1.10 (*)    GFR calc non Af Amer 48 (*)    GFR calc Af Amer 55 (*)    All other components within normal limits  CBC - Abnormal; Notable for the following components:   RBC 2.24 (*)    Hemoglobin 6.5 (*)    HCT 19.4 (*)    RDW 15.2 (*)    All other components within normal limits  TROPONIN I - Abnormal; Notable for the following components:   Troponin I 0.03 (*)    All other components within normal limits  PREPARE RBC (CROSSMATCH)  TYPE AND SCREEN   ____________________________________________  EKG  ED ECG REPORT I, Lavonia Drafts, the attending physician, personally viewed and interpreted this ECG.  Date: 06/07/2018  Rhythm: Sinus tachycardia QRS Axis: normal Intervals: normal ST/T Wave abnormalities: Non specific ST changes   ____________________________________________  RADIOLOGY  X-ray no acute distress ____________________________________________   PROCEDURES  Procedure(s) performed: No  Procedures   Critical Care performed: yes  CRITICAL CARE Performed by: Lavonia Drafts   Total critical care  time:30 minutes  Critical care time was exclusive of separately billable procedures and treating other patients.  Critical care was necessary to treat or prevent imminent or life-threatening deterioration.  Critical care was time spent personally by me on the following activities: development of treatment plan with patient and/or surrogate as well as nursing, discussions with consultants, evaluation of patient's response to treatment, examination of patient, obtaining history from patient or surrogate, ordering and performing treatments and interventions, ordering and review of laboratory studies, ordering and review of radiographic studies, pulse oximetry and re-evaluation of patient's  condition.  ____________________________________________   INITIAL IMPRESSION / ASSESSMENT AND PLAN / ED COURSE  Pertinent labs & imaging results that were available during my care of the patient were reviewed by me and considered in my medical decision making (see chart for details).  Patient presents with significant tachycardia and hypotension.  She is pale on exam with a history of GI bleed.  On rectal exam guaiac positive, brown stool.  Hemoglobin is 6.5 down from 12.2 6 months ago.  Intermittent chest tightness likely related to strain given tachycardia.  IV fluids started with improvement in blood pressure and heart rate.  Consented the patient to transfuse her, discussed with the hospitalist for admission    ____________________________________________   FINAL CLINICAL IMPRESSION(S) / ED DIAGNOSES  Final diagnoses:  Gastrointestinal hemorrhage, unspecified gastrointestinal hemorrhage type        Note:  This document was prepared using Dragon voice recognition software and may include unintentional dictation errors.    Lavonia Drafts, MD 06/07/18 [phone]

## 2018-06-07 NOTE — H&P (Addendum)
[place] [facility] [place] NAME: Kathryn Ware    MR#:  [id number]  DATE OF BIRTH:  1941/09/18  DATE OF ADMISSION:  06/07/2018  PRIMARY CARE PHYSICIAN: Venia Carbon, MD   REQUESTING/REFERRING PHYSICIAN: Dr. Corky Downs  CHIEF COMPLAINT:   Chief Complaint  Patient presents with  . Chest Pain  . Shortness of Breath   Generalized weakness several days HISTORY OF PRESENT ILLNESS:  Kathryn Ware  is a 77 y.o. female with a known history of GI bleeding, AVM, anemia, hypertension, diabetes, hyperlipidemia and RLS.  The patient presented the ED with above chief complaint.  She also complains of melena, dizziness and the chest tightness.  She has similar symptoms when she needed blood transfusions in the past.  She was found low hemoglobin 6.5.  The previous hemoglobin was 12.2.  Dr. Corky Downs requests admission.  PAST MEDICAL HISTORY:   Past Medical History:  Diagnosis Date  . Anemia   . ARF (acute renal failure) ([place])   . Blood transfusion without reported diagnosis   . Diabetes mellitus type II   . GI (gastrointestinal bleed)   . GI AVM (gastrointestinal arteriovenous vascular malformation)    stomach  . History of echocardiogram 12/2013   a. 12/2013: EF 60-65%, DD, mild LVH, nl RV size & systolic function, mild MR/TR, mild AS, nl RVSP; b. TTE 04/2017: EF 60-65%, normal wall motion, GR1DD, mild MR, left atrium normal in size, normal RV systolic function, PASP normal   . History of nuclear stress test    a. 12/2013: low risk, no sig WMA, nondiag EKG 2/2 baseline LVH w/ repol abnl, no sig ischemia, EF 70% no artifact  . HLD (hyperlipidemia)   . HTN (hypertension)   . Hypercholesterolemia   . IDA (iron deficiency anemia)   . Polyp of colon, adenomatous   . RLS (restless legs syndrome)     PAST SURGICAL HISTORY:   Past Surgical History:  Procedure Laterality Date  . ABDOMINAL ADHESION SURGERY  11/2001   Dr. Tamala Julian  . ABDOMINAL HYSTERECTOMY  1976   RSO (fibroid)  . COLONOSCOPY WITH PROPOFOL N/A 07/07/2016   Procedure: COLONOSCOPY WITH PROPOFOL;  Surgeon: Manya Silvas, MD;  Location: [facility] ENDOSCOPY;  Service: Endoscopy;  Laterality: N/A;  . COLONOSCOPY WITH PROPOFOL N/A 12/14/2016   Procedure: COLONOSCOPY WITH PROPOFOL;  Surgeon: Manya Silvas, MD;  Location: [facility] ENDOSCOPY;  Service: Endoscopy;  Laterality: N/A;  . DOBUTAMINE STRESS ECHO  11/10   normal  . DOPPLER ECHOCARDIOGRAPHY     EF 55%, mild MR, TR  . ESOPHAGOGASTRODUODENOSCOPY  07/2006   multiple angioectasias  . ESOPHAGOGASTRODUODENOSCOPY Left 03/12/2015   Procedure: place tube in throat and evaluate stomach and duodenum for source of bleeding. Cauterize if concern for rebleeding.;  Surgeon: Hulen Luster, MD;  Location: [facility] ENDOSCOPY;  Service: Endoscopy;  Laterality: Left;  . ESOPHAGOGASTRODUODENOSCOPY N/A 12/14/2016   Procedure: ESOPHAGOGASTRODUODENOSCOPY (EGD);  Surgeon: Manya Silvas, MD;  Location: [facility] ENDOSCOPY;  Service: Endoscopy;  Laterality: N/A;  . ESOPHAGOGASTRODUODENOSCOPY N/A 07/19/2017   Procedure: ESOPHAGOGASTRODUODENOSCOPY (EGD);  Surgeon: Lin Landsman, MD;  Location: [facility] ENDOSCOPY;  Service: Gastroenterology;  Laterality: N/A;  . ESOPHAGOGASTRODUODENOSCOPY (EGD) WITH PROPOFOL N/A 11/23/2015   Procedure: ESOPHAGOGASTRODUODENOSCOPY (EGD) WITH PROPOFOL;  Surgeon: Hulen Luster, MD;  Location: [facility] ENDOSCOPY;  Service: Gastroenterology;  Laterality: N/A;  . ESOPHAGOGASTRODUODENOSCOPY (EGD) WITH PROPOFOL N/A 07/07/2016   Procedure: ESOPHAGOGASTRODUODENOSCOPY (EGD) WITH PROPOFOL;  Surgeon: Manya Silvas, MD;  Location:  [facility] ENDOSCOPY;  Service: Endoscopy;  Laterality: N/A;  . ESOPHAGOGASTRODUODENOSCOPY (EGD) WITH PROPOFOL N/A 05/19/2017   Procedure: ESOPHAGOGASTRODUODENOSCOPY (EGD) WITH PROPOFOL;  Surgeon: Manya Silvas, MD;  Location: [facility] ENDOSCOPY;  Service: Endoscopy;  Laterality: N/A;  . laryngeal polyp  10/2008   Dr. Tami Ribas  . TOP      SOCIAL  HISTORY:   Social History   Tobacco Use  . Smoking status: Current Every Day Smoker    Packs/day: 1.00    Types: Cigarettes  . Smokeless tobacco: Never Used  Substance Use Topics  . Alcohol use: No    FAMILY HISTORY:   Family History  Problem Relation Age of Onset  . Stroke Father   . Cancer Sister        breast cancer  . Hypertension Mother   . Hypertension Unknown        sibling  . Diabetes Unknown        grandmother    DRUG ALLERGIES:   Allergies  Allergen Reactions  . Metformin     REACTION: diarrhea when dosage is increased, patient aware that she is onmetformin when it is listed that she is allergic  . Nifedipine     REACTION: cough    REVIEW OF SYSTEMS:   Review of Systems  Constitutional: Positive for malaise/fatigue. Negative for chills and fever.  HENT: Negative for sore throat.   Eyes: Negative for blurred vision and double vision.  Respiratory: Negative for cough, hemoptysis, shortness of breath, wheezing and stridor.   Cardiovascular: Positive for chest pain. Negative for palpitations, orthopnea and leg swelling.  Gastrointestinal: Positive for melena. Negative for abdominal pain, blood in stool, diarrhea, nausea and vomiting.  Genitourinary: Negative for dysuria, flank pain and hematuria.  Musculoskeletal: Negative for back pain and joint pain.  Skin: Negative for rash.  Neurological: Positive for dizziness. Negative for sensory change, focal weakness, seizures, loss of consciousness, weakness and headaches.  Endo/Heme/Allergies: Negative for polydipsia.  Psychiatric/Behavioral: Negative for depression. The patient is not nervous/anxious.     MEDICATIONS AT HOME:   Prior to Admission medications   Medication Sig Start Date End Date Taking? Authorizing Provider  clonazePAM (KLONOPIN) 0.5 MG tablet TAKE 1 TABLET(0.5 MG) BY MOUTH DAILY AS NEEDED 05/17/18  Yes Baity, Coralie Keens, NP  donepezil (ARICEPT) 5 MG tablet Take 1 tablet (5 mg total) by mouth  at bedtime. 06/05/18  Yes Viviana Simpler I, MD  ferrous sulfate 325 (65 FE) MG tablet Take 325 mg by mouth 2 (two) times daily.    Yes [provider]  lisinopril-hydrochlorothiazide (PRINZIDE,ZESTORETIC) 20-12.5 MG tablet Take 1 tablet by mouth daily. Patient taking differently: Take 2 tablets by mouth daily.  07/20/16  Yes Venia Carbon, MD  metFORMIN (GLUCOPHAGE-XR) 500 MG 24 hr tablet Take 1 tablet (500 mg total) by mouth daily with breakfast. 11/06/17  Yes Venia Carbon, MD  omeprazole (PRILOSEC) 40 MG capsule TAKE 1 CAPSULE(40 MG) BY MOUTH TWICE DAILY BEFORE A MEAL 08/21/17  Yes Venia Carbon, MD  simvastatin (ZOCOR) 80 MG tablet Take 80 mg by mouth at bedtime.   Yes [provider]  sucralfate (CARAFATE) 1 g tablet Take 1 g by mouth 4 (four) times daily as needed. For indigestion   Yes [provider]  vitamin B-12 (CYANOCOBALAMIN) 1000 MCG tablet Take 1,000 mcg by mouth daily.   Yes [provider]  ONE TOUCH ULTRA TEST test strip TEST ONCE DAILY AS DIRECTED 01/26/18   Venia Carbon, MD  VITAL SIGNS:  Blood pressure (!) 120/50, pulse 90, temperature 97.6 F (36.4 C), temperature source Oral, resp. rate (!) 25, height 5\' 4"  (1.626 m), weight 50.8 kg, SpO2 100 %.  PHYSICAL EXAMINATION:  Physical Exam  GENERAL:  77 y.o.-year-old patient lying in the bed with no acute distress.  EYES: Pupils equal, round, reactive to light and accommodation. No scleral icterus. Extraocular muscles intact.  Pale conjunctivae. HEENT: Head atraumatic, normocephalic. Oropharynx and nasopharynx clear.  NECK:  Supple, no jugular venous distention. No thyroid enlargement, no tenderness.  LUNGS: Normal breath sounds bilaterally, no wheezing, rales,rhonchi or crepitation. No use of accessory muscles of respiration.  CARDIOVASCULAR: S1, S2 normal. No murmurs, rubs, or gallops.  ABDOMEN: Soft, nontender, nondistended. Bowel sounds present. No organomegaly or  mass.  EXTREMITIES: No pedal edema, cyanosis, or clubbing.  NEUROLOGIC: Cranial nerves II through XII are intact. Muscle strength 4/5 in all extremities. Sensation intact. Gait not checked.  PSYCHIATRIC: The patient is alert and oriented x 3.  SKIN: No obvious rash, lesion, or ulcer.   LABORATORY PANEL:   CBC Recent Labs  Lab 06/07/18 1408  WBC 7.4  HGB 6.5*  HCT 19.4*  PLT 226   ------------------------------------------------------------------------------------------------------------------  Chemistries  Recent Labs  Lab 06/07/18 1408  NA 140  K 4.2  CL 110  CO2 23  GLUCOSE 156*  BUN 39*  CREATININE 1.10*  CALCIUM 9.6   ------------------------------------------------------------------------------------------------------------------  Cardiac Enzymes Recent Labs  Lab 06/07/18 1408  TROPONINI 0.03*   ------------------------------------------------------------------------------------------------------------------  RADIOLOGY:  Dg Chest 2 View  Result Date: 06/07/2018 CLINICAL DATA:  Chest pain and shortness of breath EXAM: CHEST - 2 VIEW COMPARISON:  July 18, 2017 FINDINGS: There is no edema or consolidation. The heart size and pulmonary vascularity are normal. No adenopathy. There is aortic atherosclerosis. No evident bone lesions. There is thoracolumbar dextroscoliosis. IMPRESSION: Aortic atherosclerosis.  No edema or consolidation. Aortic Atherosclerosis (ICD10-I70.0). Electronically Signed   By: Lowella Grip III M.D.   On: 06/07/2018 14:29      IMPRESSION AND PLAN:   Symptomatic anemia due to acute blood loss secondary to GI bleeding. The patient will be admitted to medical floor. PRBC 2 units transfusion, follow-up hemoglobin. PPI twice daily IV, continue Carafate, GI consult for possible endoscopy.  Hypotension.  Continue IV fluid support and follow-up vital signs. Hypertension.  Hold hypertension medication due to low side blood pressure and  tachycardia.  Dehydration.  Continue IV fluid support in the follow-up BMP. Diabetes.  Start sliding scale. Tobacco abuse.  Smoking cessation was counseled for 3 to 4 minutes.  Patient does not want nicotine patch. All the records are reviewed and case discussed with ED provider. Management plans discussed with the patient, family and they are in agreement.  CODE STATUS: Full code.  TOTAL TIME TAKING CARE OF THIS PATIENT: 42 minutes.    Demetrios Loll M.D on 06/07/2018 at 3:38 PM  Between 7am to 6pm - Pager - [phone]  After 6pm go to [email or web address] - Proofreader  [facility]  Office  [phone]  CC: Primary care physician; Venia Carbon, MD   Note: This dictation was prepared with Dragon dictation along with smaller phrase technology. Any transcriptional errors that result from this process are unin

## 2018-06-08 ENCOUNTER — Encounter
Admission: EM | Disposition: A | Discharge status: Home / Self Care | Payer: Self-pay | Source: Home / Self Care | Attending: Internal Medicine

## 2018-06-08 ENCOUNTER — Inpatient Hospital Stay: Payer: Medicare Other | Admitting: Anesthesiology

## 2018-06-08 DIAGNOSIS — D500 Iron deficiency anemia secondary to blood loss (chronic): Secondary | ICD-10-CM

## 2018-06-08 DIAGNOSIS — K921 Melena: Secondary | ICD-10-CM

## 2018-06-08 DIAGNOSIS — K5521 Angiodysplasia of colon with hemorrhage: Secondary | ICD-10-CM

## 2018-06-08 HISTORY — PX: ENTEROSCOPY: SHX5533

## 2018-06-08 LAB — HEMOGLOBIN AND HEMATOCRIT, BLOOD
HCT: 20.8 % — ABNORMAL LOW (ref 35.0–47.0)
HCT: 30.8 % — ABNORMAL LOW (ref 35.0–47.0)
Hemoglobin: 7.2 g/dL — ABNORMAL LOW (ref 12.0–16.0)
Hemoglobin: 10.3 g/dL — ABNORMAL LOW (ref 12.0–16.0)

## 2018-06-08 LAB — CBC
HCT: 21.8 % — ABNORMAL LOW (ref 35.0–47.0)
Hemoglobin: 7.2 g/dL — ABNORMAL LOW (ref 12.0–16.0)
MCH: 28.9 pg (ref 26.0–34.0)
MCHC: 33.1 g/dL (ref 32.0–36.0)
MCV: 87.4 fL (ref 80.0–100.0)
PLATELETS: 157 10*3/uL (ref 150–440)
RBC: 2.49 MIL/uL — ABNORMAL LOW (ref 3.80–5.20)
RDW: 14.2 % (ref 11.5–14.5)
WBC: 5.5 10*3/uL (ref 3.6–11.0)

## 2018-06-08 LAB — IRON AND TIBC
IRON: 55 ug/dL (ref 28–170)
SATURATION RATIOS: 16 % (ref 10.4–31.8)
TIBC: 344 ug/dL (ref 250–450)
UIBC: 289 ug/dL

## 2018-06-08 LAB — GLUCOSE, CAPILLARY
GLUCOSE-CAPILLARY: 87 mg/dL (ref 70–99)
GLUCOSE-CAPILLARY: 90 mg/dL (ref 70–99)
Glucose-Capillary: 90 mg/dL (ref 70–99)

## 2018-06-08 LAB — BASIC METABOLIC PANEL
Anion gap: 4 — ABNORMAL LOW (ref 5–15)
BUN: 32 mg/dL — AB (ref 8–23)
CALCIUM: 8.6 mg/dL — AB (ref 8.9–10.3)
CHLORIDE: 116 mmol/L — AB (ref 98–111)
CO2: 21 mmol/L — ABNORMAL LOW (ref 22–32)
CREATININE: 0.99 mg/dL (ref 0.44–1.00)
GFR calc non Af Amer: 54 mL/min — ABNORMAL LOW (ref >60)
Glucose, Bld: 95 mg/dL (ref 70–99)
Potassium: 4.3 mmol/L (ref 3.5–5.1)
SODIUM: 141 mmol/L (ref 135–145)

## 2018-06-08 LAB — PREPARE RBC (CROSSMATCH)

## 2018-06-08 LAB — FERRITIN: Ferritin: 16 ng/mL (ref 11–307)

## 2018-06-08 SURGERY — ENTEROSCOPY
Anesthesia: General

## 2018-06-08 MED ORDER — SODIUM CHLORIDE 0.9 % IV SOLN
INTRAVENOUS | Status: DC | PRN
  Administered 2018-06-08: 17:00:00 via INTRAVENOUS

## 2018-06-08 MED ORDER — PROPOFOL 500 MG/50ML IV EMUL
INTRAVENOUS | Status: DC | PRN
  Administered 2018-06-08: 80 ug/kg/min via INTRAVENOUS

## 2018-06-08 MED ORDER — SODIUM CHLORIDE 0.9% IV SOLUTION
Freq: Once | INTRAVENOUS | Status: AC
  Administered 2018-06-08: 15:00:00 via INTRAVENOUS

## 2018-06-08 MED ORDER — FERUMOXYTOL INJECTION 510 MG/17 ML
510.0000 mg | Freq: Once | INTRAVENOUS | Status: AC
  Administered 2018-06-08: 510 mg via INTRAVENOUS
  Filled 2018-06-08: qty 17

## 2018-06-08 MED ORDER — LISINOPRIL 10 MG PO TABS
5.0000 mg | ORAL_TABLET | Freq: Every day | ORAL | Status: DC
  Administered 2018-06-09: 5 mg via ORAL
  Filled 2018-06-08: qty 1

## 2018-06-08 MED ORDER — PROPOFOL 10 MG/ML IV BOLUS
INTRAVENOUS | Status: AC
  Filled 2018-06-08: qty 40

## 2018-06-08 MED ORDER — PROPOFOL 10 MG/ML IV BOLUS
INTRAVENOUS | Status: DC | PRN
  Administered 2018-06-08: 50 mg via INTRAVENOUS
  Administered 2018-06-08 (×2): 30 mg via INTRAVENOUS

## 2018-06-08 MED ORDER — NICOTINE 14 MG/24HR TD PT24
14.0000 mg | MEDICATED_PATCH | Freq: Every day | TRANSDERMAL | Status: DC
Start: 2018-06-08 — End: 2018-06-09
  Administered 2018-06-08 – 2018-06-09 (×2): 14 mg via TRANSDERMAL
  Filled 2018-06-08 (×2): qty 1

## 2018-06-08 MED ORDER — FAMOTIDINE IN NACL 20-0.9 MG/50ML-% IV SOLN
20.0000 mg | INTRAVENOUS | Status: DC
  Administered 2018-06-09: 20 mg via INTRAVENOUS
  Filled 2018-06-08: qty 50

## 2018-06-08 MED ORDER — PHENOL 1.4 % MT LIQD
1.0000 | OROMUCOSAL | Status: DC | PRN
  Administered 2018-06-08: 1 via OROMUCOSAL
  Filled 2018-06-08: qty 177

## 2018-06-08 MED ORDER — PROPOFOL 500 MG/50ML IV EMUL
INTRAVENOUS | Status: AC
  Filled 2018-06-08: qty 50

## 2018-06-08 MED ORDER — SODIUM CHLORIDE 0.9 % IV SOLN: INTRAVENOUS | Status: DC

## 2018-06-08 MED ORDER — CLONAZEPAM 0.5 MG PO TABS
0.5000 mg | ORAL_TABLET | Freq: Every day | ORAL | Status: DC
  Administered 2018-06-08: 0.5 mg via ORAL
  Filled 2018-06-08: qty 1

## 2018-06-08 MED ORDER — ENSURE ENLIVE PO LIQD: 237.0000 mL | Freq: Two times a day (BID) | ORAL | Status: DC

## 2018-06-08 NOTE — Anesthesia Post-op Follow-up Note (Signed)
Anesthesia QCDR form completed.        

## 2018-06-08 NOTE — Anesthesia Postprocedure Evaluation (Signed)
Anesthesia Post Note  Patient: Kathryn Ware  Procedure(s) Performed: ENTEROSCOPY (N/A )  Patient location during evaluation: Endoscopy Anesthesia Type: General Level of consciousness: awake and alert Pain management: pain level controlled Vital Signs Assessment: post-procedure vital signs reviewed and stable Respiratory status: spontaneous breathing and respiratory function stable Cardiovascular status: stable Anesthetic complications: no     Last Vitals:  Vitals:   06/08/18 1846 06/08/18 1900  BP: (!) 143/65 (!) 145/60  Pulse: 84 87  Resp: (!) 28 16  Temp:  36.4 C  SpO2: 94% 94%    Last Pain:  Vitals:   06/08/18 1900  TempSrc: Oral  PainSc:                  Bobbyjoe Pabst K

## 2018-06-08 NOTE — Transfer of Care (Signed)
Immediate Anesthesia Transfer of Care Note  Patient: Kathryn Ware  Procedure(s) Performed: ENTEROSCOPY (N/A )  Patient Location: Endoscopy Unit  Anesthesia Type:General  Level of Consciousness: awake and drowsy  Airway & Oxygen Therapy: Patient Spontanous Breathing and Patient connected to face mask oxygen  Post-op Assessment: Report given to RN, Post -op Vital signs reviewed and stable and Patient moving all extremities  Post vital signs: Reviewed and stable  Last Vitals:  Vitals Value Taken Time  BP 143/60 06/08/2018  6:06 PM  Temp 36.1 C 06/08/2018  6:06 PM  Pulse 95 06/08/2018  6:10 PM  Resp 25 06/08/2018  6:10 PM  SpO2 100 % 06/08/2018  6:10 PM  Vitals shown include unvalidated device data.  Last Pain:  Vitals:   06/08/18 1806  TempSrc: Tympanic  PainSc: Asleep         Complications: No apparent anesthesia complications

## 2018-06-08 NOTE — Progress Notes (Signed)
[facility] [place] NAME: Kathryn Ware    MR#:  [id number]  DATE OF BIRTH:  06/22/1941  SUBJECTIVE:  CHIEF COMPLAINT:  patient is reporting black and tarry stools but also admits that she takes iron tablets every day.  Denies any vomiting of the blood or abdominal pain.  Admits smoking half pack a day  REVIEW OF SYSTEMS:  CONSTITUTIONAL: No fever, fatigue or weakness.  EYES: No blurred or double vision.  EARS, NOSE, AND THROAT: No tinnitus or ear pain.  RESPIRATORY: No cough, shortness of breath, wheezing or hemoptysis.  CARDIOVASCULAR: No chest pain, orthopnea, edema.  GASTROINTESTINAL: No nausea, vomiting, diarrhea or abdominal pain.  Reporting black and tarry stool GENITOURINARY: No dysuria, hematuria.  ENDOCRINE: No polyuria, nocturia,  HEMATOLOGY: No anemia, easy bruising or bleeding SKIN: No rash or lesion. MUSCULOSKELETAL: No joint pain or arthritis.   NEUROLOGIC: No tingling, numbness, weakness.  PSYCHIATRY: No anxiety or depression.   DRUG ALLERGIES:   Allergies  Allergen Reactions  . Metformin     REACTION: diarrhea when dosage is increased, patient aware that she is onmetformin when it is listed that she is allergic  . Nifedipine     REACTION: cough    VITALS:  Blood pressure 136/75, pulse 87, temperature 98.1 F (36.7 C), temperature source Oral, resp. rate 20, height 5\' 4"  (1.626 m), weight 50.8 kg, SpO2 100 %.  PHYSICAL EXAMINATION:  GENERAL:  77 y.o.-year-old patient lying in the bed with no acute distress.  EYES: Pupils equal, round, reactive to light and accommodation. No scleral icterus. Extraocular muscles intact.  HEENT: Head atraumatic, normocephalic. Oropharynx and nasopharynx clear.  NECK:  Supple, no jugular venous distention. No thyroid enlargement, no tenderness.  LUNGS: Normal breath sounds bilaterally, no wheezing, rales,rhonchi or crepitation. No use of accessory muscles of respiration.   CARDIOVASCULAR: S1, S2 normal. No murmurs, rubs, or gallops.  ABDOMEN: Soft, nontender, nondistended. Bowel sounds present.  EXTREMITIES: No pedal edema, cyanosis, or clubbing.  NEUROLOGIC: Cranial nerves II through XII are intact.Sensation intact. Gait not checked.  PSYCHIATRIC: The patient is alert and oriented x 3.  SKIN: No obvious rash, lesion, or ulcer.    LABORATORY PANEL:   CBC Recent Labs  Lab 06/08/18 0434 06/08/18 1030  WBC 5.5  --   HGB 7.2* 7.2*  HCT 21.8* 20.8*  PLT 157  --    ------------------------------------------------------------------------------------------------------------------  Chemistries  Recent Labs  Lab 06/08/18 0434  NA 141  K 4.3  CL 116*  CO2 21*  GLUCOSE 95  BUN 32*  CREATININE 0.99  CALCIUM 8.6*   ------------------------------------------------------------------------------------------------------------------  Cardiac Enzymes Recent Labs  Lab 06/07/18 1408  TROPONINI 0.03*   ------------------------------------------------------------------------------------------------------------------  RADIOLOGY:  Dg Chest 2 View  Result Date: 06/07/2018 CLINICAL DATA:  Chest pain and shortness of breath EXAM: CHEST - 2 VIEW COMPARISON:  July 18, 2017 FINDINGS: There is no edema or consolidation. The heart size and pulmonary vascularity are normal. No adenopathy. There is aortic atherosclerosis. No evident bone lesions. There is thoracolumbar dextroscoliosis. IMPRESSION: Aortic atherosclerosis.  No edema or consolidation. Aortic Atherosclerosis (ICD10-I70.0). Electronically Signed   By: Lowella Grip III M.D.   On: 06/07/2018 14:29    EKG:   Orders placed or performed during the hospital encounter of 06/07/18  . EKG 12-Lead  . EKG 12-Lead  . ED EKG within 10 minutes  . ED EKG within 10 minutes    ASSESSMENT AND PLAN:   Symptomatic anemia  due to acute blood loss secondary to GI bleeding.  PRBC 2 units transfusion   follow-up hemoglobin.  Hemoglobin 6.5-7.2 PPI twice daily  , continue Carafate  GI consult for possible endoscopy.  Hypotension.    Improved with IV fluids continue IV fluid support and follow-up vital signs.  Hypertension.  Hold hypertension medication lisinopril hydrochlorothiazide  And a small dose 10 mg lisinopril  Dehydration.  Continue IV fluid support in the follow-up BMP . Diabetes.  Start sliding scale.  Tobacco abuse.  Smoking cessation was counseled for 3 to 4 minutes.  Patient does not want nicotine patch     All the records are reviewed and case discussed with Care Management/Social Workerr. Management plans discussed with the patient, family and they are in agreement.  CODE STATUS: fc   TOTAL TIME TAKING CARE OF THIS PATIENT: 36  minutes.   POSSIBLE D/C IN  1-2  DAYS, DEPENDING ON CLINICAL CONDITION.  Note: This dictation was prepared with Dragon dictation along with smaller phrase technology. Any transcriptional errors that result from this process are unintentional.   Nicholes Mango M.D on 06/08/2018 at 2:08 PM  Between 7am to 6pm - Pager - [phone] After 6pm go to [email or web address] - password [email or web address] [facility]  Office  [phone]  CC: Primary care physician; Venia Carbon, MD

## 2018-06-08 NOTE — Op Note (Signed)
[facility] Gastroenterology Patient Name: Kathryn Ware Procedure Date: 06/08/2018 5:08 PM MRN: [id number] Account #: [id number] Date of Birth: [birth year] Admit Type: Inpatient Age: 77 Room: [facility] ENDO ROOM 4 Gender: Female Note Status: Finalized Procedure:            Small bowel enteroscopy Indications:          Iron deficiency anemia secondary to chronic blood loss,                        Melena, Arteriovenous malformation in the small                        intestine Providers:            Lin Landsman MD, MD Referring MD:         Venia Carbon (Referring MD) Medicines:            Monitored Anesthesia Care Complications:        No immediate complications. Estimated blood loss:                        Minimal. Procedure:            Pre-Anesthesia Assessment:                       - Prior to the procedure, a History and Physical was                        performed, and patient medications and allergies were                        reviewed. The patient is competent. The risks and                        benefits of the procedure and the sedation options and                        risks were discussed with the patient. All questions                        were answered and informed consent was obtained.                        Patient identification and proposed procedure were                        verified by the physician, the nurse, the                        anesthesiologist, the anesthetist and the technician in                        the pre-procedure area in the procedure room in the                        endoscopy suite. Mental Status Examination: alert and                        oriented. Airway Examination: normal oropharyngeal  airway and neck mobility. Respiratory Examination:                        clear to auscultation. CV Examination: normal.                        Prophylactic Antibiotics: The patient does not require                        prophylactic antibiotics. Prior Anticoagulants: The                        patient has taken no previous anticoagulant or                        antiplatelet agents. ASA Grade Assessment: III - A                        patient with severe systemic disease. After reviewing                        the risks and benefits, the patient was deemed in                        satisfactory condition to undergo the procedure. The                        anesthesia plan was to use monitored anesthesia care                        (MAC). Immediately prior to administration of                        medications, the patient was re-assessed for adequacy                        to receive sedatives. The heart rate, respiratory rate,                        oxygen saturations, blood pressure, adequacy of                        pulmonary ventilation, and response to care were                        monitored throughout the procedure. The physical status                        of the patient was re-assessed after the procedure.                       After obtaining informed consent, the endoscope was                        passed under direct vision. Throughout the procedure,                        the patient's blood pressure, pulse, and oxygen                        saturations  were monitored continuously. The                        Colonoscope was introduced through the mouth and                        advanced to the proximal jejunum. The small bowel                        enteroscopy was accomplished without difficulty. The                        patient tolerated the procedure fairly well. Findings:      Three large angiodysplastic lesions, two lesions with bleeding were       found in the second portion of the duodenum and in the fourth portion of       the duodenum. Coagulation for hemostasis using argon plasma was       successful. Estimated blood loss was minimal. For hemostasis, two        hemostatic clips were successfully placed (MR conditional) on the AVM in       D2. There was no bleeding at the end of the procedure. Three small       nonbleeding AVMs in proximal small bowel were treated with APC      The entire examined stomach was normal.      A medium-sized hiatal hernia was present.      The gastroesophageal junction and examined esophagus were normal. Impression:           - Three bleeding angiodysplastic lesions in the                        duodenum. Treated with argon plasma coagulation (APC).                        Clips (MR conditional) were placed.                       - Normal stomach.                       - Medium-sized hiatal hernia.                       - Normal gastroesophageal junction and esophagus.                       - No specimens collected. Recommendation:       - Return patient to hospital ward for ongoing care.                       - Clear liquid diet today.                       - Quit smoking                       - Monitor CBC, transfuse as needed                       - Iron therapy                       -  F/u with GI as outpt                       - No aspirin, ibuprofen, naproxen, or other                        non-steroidal anti-inflammatory drugs. Procedure Code(s):    --- Professional ---                       [phone], Small intestinal endoscopy, enteroscopy beyond                        second portion of duodenum, not including ileum; with                        control of bleeding (eg, injection, bipolar cautery,                        unipolar cautery, laser, heater probe, stapler, plasma                        coagulator) Diagnosis Code(s):    --- Professional ---                       K31.811, Angiodysplasia of stomach and duodenum with                        bleeding                       K44.9, Diaphragmatic hernia without obstruction or                        gangrene                       D50.0, Iron deficiency  anemia secondary to blood loss                        (chronic)                       K92.1, Melena (includes Hematochezia)                       Q27.33, Arteriovenous malformation of digestive system                        vessel CPT copyright 2017 American Medical Association. All rights reserved. The codes documented in this report are preliminary and upon coder review may  be revised to meet current compliance requirements. Dr. Ulyess Mort Lin Landsman MD, MD 06/08/2018 6:01:31 PM This report has been signed electronically. Number of Addenda: 0 Note Initiated On: 06/08/2018 5:08 PM      [facility]

## 2018-06-08 NOTE — Consult Note (Signed)
Kathryn Darby, MD [address]  [place]  [place], [place] [address]  Main: [phone]  Fax: [phone] Pager: [phone]   Consultation  Referring Provider:     No ref. provider found Primary Care Physician:  Venia Carbon, MD Primary Gastroenterologist:  Dr. Tiffany Kocher        Reason for Consultation:     Melena, symptomatic anemia  Date of Admission:  06/07/2018 Date of Consultation:  06/08/2018         HPI:   Kathryn Ware is a 77 y.o. African-American female with chronic tobacco use, who is known to Dr Tiffany Kocher in the past , last seen at his clinic in 08/2016 . She has history of chronic iron deficiency anemia secondary to chronic blood loss from small bowel AVMs. She was last admitted to [facility] in 06/2017 at which time she underwent upper endoscopyand an small bowel enteroscopy, found to have large AVMs in the distal duodenum and proximal jejunum that were treated with APC. She did not follow up with gastroenterologist since that admission. She has been taking oral iron. For the last 1 week, patient has been progressively getting weak associated with chest pain and shortness of breath. She felt like her blood is low and needs to have a transfusion, and therefore presented to [facility]. Her hemoglobin on admission was 6.5, she received 1 unit of PRBCs and when I saw her she had was receiving second unit. Her hemoglobin was 7.2 after 1 unit. She reports that her stools are always black as she is taking iron.  NSAIDs: None  Antiplts/Anticoagulants/Anti thrombotics: None   GI Procedures:  EGD/push enteroscopy 07/19/2017 Normal stomach, esophagus - A few non-bleeding angiodysplastic lesions in the duodenum. Treated with argon plasma coagulation (APC). - No specimens collected.  EGD 03/12/2015 Multiple nonbleeding AVMs in duodenum, s/p cautery EGD 11/23/2015 Multiple nonbleeding AVMs in duodenum, s/p APC EGD  07/07/2016 Multiple nonbleeding AVMs in duodenum, status post APC and clips EGD 12/14/2016 Solitary AVM of the duodenum, treated with APC A. DUODENAL BULB POLYP; COLD BIOPSY:  - NODULAR BRUNNER'S GLAND HYPERPLASIA AND ACUTE DUODENITIS, SEE NOTE.  - NEGATIVE FOR INTRA-EPITHELIAL LYMPHOCYTOSIS, DYSPLASIA AND MALIGNANCY. EGD 05/19/2017 No AVMs were found  Colonoscopy 12/14/2016 2 small polyps were found in the splenic flexure and descending colon, removed with hot snare. Internal hemorrhoids DIAGNOSIS:   B. COLON POLYP 2, HEPATIC FLEXURE; HOT SNARE:  - TUBULAR ADENOMAS (2).  - NEGATIVE FOR HIGH-GRADE DYSPLASIA AND MALIGNANCY.   Colonoscopy 07/07/2016 Single 15 millimeter polyp in mid ascending colon, removed in piecemeal with hot snare, placed DIAGNOSIS:  COLON POLYP, ASCENDING; HOT SNARE:  - TUBULOVILLOUS ADENOMA.  - NEGATIVE FOR HIGH-GRADE DYSPLASIA AND MALIGNANCY.   Colonoscopy 07/24/2006 Multiple AVMs  Past Medical History:  Diagnosis Date  . Anemia   . ARF (acute renal failure) ([facility])   . Blood transfusion without reported diagnosis   . Diabetes mellitus type II   . GI (gastrointestinal bleed)   . GI AVM (gastrointestinal arteriovenous vascular malformation)    stomach  . History of echocardiogram 12/2013   a. 12/2013: EF 60-65%, DD, mild LVH, nl RV size & systolic function, mild MR/TR, mild AS, nl RVSP; b. TTE 04/2017: EF 60-65%, normal wall motion, GR1DD, mild MR, left atrium normal in size, normal RV systolic function, PASP normal   . History of nuclear stress test    a. 12/2013: low risk, no sig WMA, nondiag EKG 2/2  baseline LVH w/ repol abnl, no sig ischemia, EF 70% no artifact  . HLD (hyperlipidemia)   . HTN (hypertension)   . Hypercholesterolemia   . IDA (iron deficiency anemia)   . Polyp of colon, adenomatous   . RLS (restless legs syndrome)     Past Surgical History:  Procedure Laterality Date  . ABDOMINAL ADHESION SURGERY  11/2001   Dr. Tamala Julian  .  ABDOMINAL HYSTERECTOMY  1976   RSO (fibroid)  . COLONOSCOPY WITH PROPOFOL N/A 07/07/2016   Procedure: COLONOSCOPY WITH PROPOFOL;  Surgeon: Manya Silvas, MD;  Location: [facility] ENDOSCOPY;  Service: Endoscopy;  Laterality: N/A;  . COLONOSCOPY WITH PROPOFOL N/A 12/14/2016   Procedure: COLONOSCOPY WITH PROPOFOL;  Surgeon: Manya Silvas, MD;  Location[facility] ENDOSCOPY;  Service: Endoscopy;  Laterality: N/A;  . DOBUTAMINE STRESS ECHO  11/10   normal  . DOPPLER ECHOCARDIOGRAPHY     EF 55%, mild MR, TR  . ESOPHAGOGASTRODUODENOSCOPY  07/2006   multiple angioectasias  . ESOPHAGOGASTRODUODENOSCOPY Left 03/12/2015   Procedure: place tube in throat and evaluate stomach and duodenum for source of bleeding. Cauterize if concern for rebleeding.;  Surgeon: Hulen Luster, MD;  Location: [facility] ENDOSCOPY;  Service: Endoscopy;  Laterality: Left;  . ESOPHAGOGASTRODUODENOSCOPY N/A 12/14/2016   Procedure: ESOPHAGOGASTRODUODENOSCOPY (EGD);  Surgeon: Manya Silvas, MD;  Location: [facility] ENDOSCOPY;  Service: Endoscopy;  Laterality: N/A;  . ESOPHAGOGASTRODUODENOSCOPY N/A 07/19/2017   Procedure: ESOPHAGOGASTRODUODENOSCOPY (EGD);  Surgeon: Lin Landsman, MD;  Location: [facility] ENDOSCOPY;  Service: Gastroenterology;  Laterality: N/A;  . ESOPHAGOGASTRODUODENOSCOPY (EGD) WITH PROPOFOL N/A 11/23/2015   Procedure: ESOPHAGOGASTRODUODENOSCOPY (EGD) WITH PROPOFOL;  Surgeon: Hulen Luster, MD;  Location: [facility] ENDOSCOPY;  Service: Gastroenterology;  Laterality: N/A;  . ESOPHAGOGASTRODUODENOSCOPY (EGD) WITH PROPOFOL N/A 07/07/2016   Procedure: ESOPHAGOGASTRODUODENOSCOPY (EGD) WITH PROPOFOL;  Surgeon: Manya Silvas, MD;  Location: [facility] ENDOSCOPY;  Service: Endoscopy;  Laterality: N/A;  . ESOPHAGOGASTRODUODENOSCOPY (EGD) WITH PROPOFOL N/A 05/19/2017   Procedure: ESOPHAGOGASTRODUODENOSCOPY (EGD) WITH PROPOFOL;  Surgeon: Manya Silvas, MD;  Location: [facility] ENDOSCOPY;  Service: Endoscopy;  Laterality: N/A;  . laryngeal polyp  10/2008   Dr.  Tami Ribas  . TOP      Prior to Admission medications   Medication Sig Start Date End Date Taking? Authorizing Provider  clonazePAM (KLONOPIN) 0.5 MG tablet TAKE 1 TABLET(0.5 MG) BY MOUTH DAILY AS NEEDED 05/17/18  Yes Baity, Coralie Keens, NP  donepezil (ARICEPT) 5 MG tablet Take 1 tablet (5 mg total) by mouth at bedtime. 06/05/18  Yes Viviana Simpler I, MD  ferrous sulfate 325 (65 FE) MG tablet Take 325 mg by mouth 2 (two) times daily.    Yes [provider]  lisinopril-hydrochlorothiazide (PRINZIDE,ZESTORETIC) 20-12.5 MG tablet Take 1 tablet by mouth daily. Patient taking differently: Take 2 tablets by mouth daily.  07/20/16  Yes Venia Carbon, MD  metFORMIN (GLUCOPHAGE-XR) 500 MG 24 hr tablet Take 1 tablet (500 mg total) by mouth daily with breakfast. 11/06/17  Yes Venia Carbon, MD  omeprazole (PRILOSEC) 40 MG capsule TAKE 1 CAPSULE(40 MG) BY MOUTH TWICE DAILY BEFORE A MEAL 08/21/17  Yes Venia Carbon, MD  simvastatin (ZOCOR) 80 MG tablet Take 80 mg by mouth at bedtime.   Yes [provider]  sucralfate (CARAFATE) 1 g tablet Take 1 g by mouth 4 (four) times daily as needed. For indigestion   Yes [provider]  vitamin B-12 (CYANOCOBALAMIN) 1000 MCG tablet Take 1,000 mcg by mouth daily.   Yes [provider]  ONE TOUCH ULTRA TEST test strip TEST ONCE DAILY AS DIRECTED 01/26/18   Venia Carbon, MD    Family History  Problem Relation Age of Onset  . Stroke Father   . Cancer Sister        breast cancer  . Hypertension Mother   . Hypertension Unknown        sibling  . Diabetes Unknown        grandmother     Social History   Tobacco Use  . Smoking status: Current Every Day Smoker    Packs/day: 1.00    Types: Cigarettes  . Smokeless tobacco: Never Used  Substance Use Topics  . Alcohol use: No  . Drug use: No    Allergies as of 06/07/2018 - Review Complete 06/07/2018  Allergen Reaction Noted  . Metformin  09/05/2006  . Nifedipine   09/05/2006    Review of Systems:    All systems reviewed and negative except where noted in HPI.   Physical Exam:  Vital signs in last 24 hours: Temp:  [97 F (36.1 C)-98.4 F (36.9 C)] 97 F (36.1 C) (08/16 1836) Pulse Rate:  [73-98] 84 (08/16 1846) Resp:  [14-31] 28 (08/16 1846) BP: (123-157)/(36-75) 143/65 (08/16 1846) SpO2:  [93 %-100 %] 94 % (08/16 1846) Last BM Date: 06/07/18 General:   Pleasant, cooperative in NAD, appears pale Head:  Normocephalic and atraumatic. Eyes:   No icterus.   Conjunctiva pale. PERRLA. Ears:  Normal auditory acuity. Neck:  Supple; no masses or thyroidomegaly Lungs: Respirations even and unlabored. Lungs clear to auscultation bilaterally.   No wheezes, crackles, or rhonchi.  Heart:  Regular rate and rhythm;  Without murmur, clicks, rubs or gallops Abdomen:  Soft, nondistended, nontender. Normal bowel sounds. No appreciable masses or hepatomegaly.  No rebound or guarding.  Rectal:  Not performed. Msk:  Symmetrical without gross deformities.  Strength generalized weakness  Extremities:  Without edema, cyanosis or clubbing. Neurologic:  Alert and oriented x3;  grossly normal neurologically. Skin:  Intact without significant lesions or rashes. Cervical Nodes:  No significant cervical adenopathy. Psych:  Alert and cooperative. Normal affect.  LAB RESULTS: CBC Latest Ref Rng & Units 06/08/2018 06/08/2018 06/07/2018  WBC 3.6 - 11.0 K/uL - 5.5 7.4  Hemoglobin 12.0 - 16.0 g/dL 7.2(L) 7.2(L) 6.5(L)  Hematocrit 35.0 - 47.0 % 20.8(L) 21.8(L) 19.4(L)  Platelets 150 - 440 K/uL - 157 226    BMET BMP Latest Ref Rng & Units 06/08/2018 06/07/2018 12/19/2017  Glucose 70 - 99 mg/dL 95 156(H) 61(L)  BUN 8 - 23 mg/dL 32(H) 39(H) 20  Creatinine 0.44 - 1.00 mg/dL 0.99 1.10(H) 1.08  Sodium 135 - 145 mmol/L 141 140 140  Potassium 3.5 - 5.1 mmol/L 4.3 4.2 4.5  Chloride 98 - 111 mmol/L 116(H) 110 105  CO2 22 - 32 mmol/L 21(L) 23 28  Calcium 8.9 - 10.3 mg/dL 8.6(L) 9.6  10.6(H)    LFT Hepatic Function Latest Ref Rng & Units 12/19/2017 03/30/2017 07/06/2016  Total Protein 6.0 - 8.3 g/dL 7.8 7.3 6.6  Albumin 3.5 - 5.2 g/dL 4.4 4.4 3.5  AST 0 - 37 U/L '14 16 19  ' ALT 0 - 35 U/L 7 8 11(L)  Alk Phosphatase 39 - 117 U/L 43 43 36(L)  Total Bilirubin 0.2 - 1.2 mg/dL 0.4 0.2 0.4  Bilirubin, Direct 0.0 - 0.3 mg/dL - - -     STUDIES: Dg Chest 2 View  Result Date: 06/07/2018 CLINICAL DATA:  Chest  pain and shortness of breath EXAM: CHEST - 2 VIEW COMPARISON:  July 18, 2017 FINDINGS: There is no edema or consolidation. The heart size and pulmonary vascularity are normal. No adenopathy. There is aortic atherosclerosis. No evident bone lesions. There is thoracolumbar dextroscoliosis. IMPRESSION: Aortic atherosclerosis.  No edema or consolidation. Aortic Atherosclerosis (ICD10-I70.0). Electronically Signed   By: Lowella Grip III M.D.   On: 06/07/2018 14:29      Impression / Plan:   Kathryn Ware is a 77 y.o. African-American female with history of chronic iron deficiency anemia secondary to blood loss from small bowel AVMs, chronic tobacco use admitted with 1 week history of symptomatic anemia. Suspect that her anemia is secondary to bleeding AVMs. Patient is nothing by mouth. Therefore, will plan for push enteroscopy today.  - Monitor CBC closely and transfuse as needed - Check iron stores - Parenteral iron therapy while inpatient - Discussed with her about possible colonoscopy if push enteroscopy is unremarkable and she is not willing to undergo colonoscopy because of the bowel prep - Quit smoking  I have discussed alternative options, risks & benefits,  which include, but are not limited to, bleeding, infection, perforation,respiratory complication & drug reaction.  The patient agrees with this plan & written consent will be obtained.    Thank you for involving me in the care of this patient. Dr. Vicente Males to cover for the weekend     LOS: 1 day   Sherri Sear, MD  06/08/2018, 6:49 PM   Note: This dictation was prepared with Dragon dictation along with smaller phrase technology. Any transcriptional errors that result from this process are unintentional.

## 2018-06-08 NOTE — Anesthesia Preprocedure Evaluation (Signed)
Anesthesia Evaluation  Patient identified by MRN, date of birth, ID band Patient awake    Reviewed: Allergy & Precautions, NPO status , Patient's Chart, lab work & pertinent test results  History of Anesthesia Complications Negative for: history of anesthetic complications  Airway Mallampati: II       Dental   Pulmonary neg sleep apnea, neg COPD, Current Smoker,           Cardiovascular hypertension, Pt. on medications + Peripheral Vascular Disease and +CHF (hx)  (-) dysrhythmias (-) Valvular Problems/Murmurs     Neuro/Psych neg Seizures    GI/Hepatic Neg liver ROS, neg GERD  ,  Endo/Other  diabetes, Type 2, Oral Hypoglycemic Agents  Renal/GU Renal InsufficiencyRenal disease     Musculoskeletal   Abdominal   Peds  Hematology   Anesthesia Other Findings   Reproductive/Obstetrics                             Anesthesia Physical Anesthesia Plan  ASA: III and emergent  Anesthesia Plan: General   Post-op Pain Management:    Induction: Intravenous  PONV Risk Score and Plan:   Airway Management Planned: Nasal Cannula  Additional Equipment:   Intra-op Plan:   Post-operative Plan:   Informed Consent: I have reviewed the patients History and Physical, chart, labs and discussed the procedure including the risks, benefits and alternatives for the proposed anesthesia with the patient or authorized representative who has indicated his/her understanding and acceptance.     Plan Discussed with:   Anesthesia Plan Comments:         Anesthesia Quick Evaluation

## 2018-06-09 DIAGNOSIS — K922 Gastrointestinal hemorrhage, unspecified: Secondary | ICD-10-CM

## 2018-06-09 LAB — TYPE AND SCREEN
ABO/RH(D): B POS
Antibody Screen: NEGATIVE
UNIT DIVISION: 0
Unit division: 0

## 2018-06-09 LAB — CBC
HCT: 28.2 % — ABNORMAL LOW (ref 35.0–47.0)
Hemoglobin: 9.7 g/dL — ABNORMAL LOW (ref 12.0–16.0)
MCH: 29.4 pg (ref 26.0–34.0)
MCHC: 34.4 g/dL (ref 32.0–36.0)
MCV: 85.6 fL (ref 80.0–100.0)
PLATELETS: 154 10*3/uL (ref 150–440)
RBC: 3.3 MIL/uL — ABNORMAL LOW (ref 3.80–5.20)
RDW: 14.4 % (ref 11.5–14.5)
WBC: 8.8 10*3/uL (ref 3.6–11.0)

## 2018-06-09 LAB — BPAM RBC
BLOOD PRODUCT EXPIRATION DATE: 201909182359
Blood Product Expiration Date: 201909182359
ISSUE DATE / TIME: 201908161513
ISSUE DATE / TIME: 201908151645
UNIT TYPE AND RH: 7300
UNIT TYPE AND RH: 7300

## 2018-06-09 LAB — BASIC METABOLIC PANEL
ANION GAP: 9 (ref 5–15)
BUN: 18 mg/dL (ref 8–23)
CALCIUM: 8.8 mg/dL — AB (ref 8.9–10.3)
CO2: 19 mmol/L — ABNORMAL LOW (ref 22–32)
Chloride: 114 mmol/L — ABNORMAL HIGH (ref 98–111)
Creatinine, Ser: 1.02 mg/dL — ABNORMAL HIGH (ref 0.44–1.00)
GFR, EST NON AFRICAN AMERICAN: 52 mL/min — AB (ref >60)
GLUCOSE: 76 mg/dL (ref 70–99)
Potassium: 3.8 mmol/L (ref 3.5–5.1)
SODIUM: 142 mmol/L (ref 135–145)

## 2018-06-09 LAB — GLUCOSE, CAPILLARY
GLUCOSE-CAPILLARY: 71 mg/dL (ref 70–99)
GLUCOSE-CAPILLARY: 114 mg/dL — AB (ref 70–99)

## 2018-06-09 LAB — HEMOGLOBIN AND HEMATOCRIT, BLOOD
HCT: 29.2 % — ABNORMAL LOW (ref 35.0–47.0)
HEMOGLOBIN: 9.9 g/dL — AB (ref 12.0–16.0)

## 2018-06-09 MED ORDER — DOCUSATE SODIUM 100 MG PO CAPS: 100.0000 mg | ORAL_CAPSULE | Freq: Two times a day (BID) | ORAL | Status: DC

## 2018-06-09 MED ORDER — ACETAMINOPHEN 325 MG PO TABS: 650.0000 mg | ORAL_TABLET | Freq: Four times a day (QID) | ORAL | Status: DC | PRN

## 2018-06-09 MED ORDER — SODIUM CHLORIDE 0.9 % IV SOLN
100.0000 mg | INTRAVENOUS | Status: DC
  Administered 2018-06-09: 100 mg via INTRAVENOUS
  Filled 2018-06-09: qty 5

## 2018-06-09 MED ORDER — DOCUSATE SODIUM 100 MG PO CAPS: 100.0000 mg | ORAL_CAPSULE | Freq: Two times a day (BID) | ORAL | 0 refills | Status: DC

## 2018-06-09 MED ORDER — FERROUS SULFATE 325 (65 FE) MG PO TABS: 325.0000 mg | ORAL_TABLET | Freq: Two times a day (BID) | ORAL | 3 refills | Status: DC

## 2018-06-09 MED ORDER — ENSURE ENLIVE PO LIQD
237.0000 mL | Freq: Two times a day (BID) | ORAL | 1 refills | Status: DC
Start: 2018-06-09 — End: 2020-01-05

## 2018-06-09 MED ORDER — NICOTINE 14 MG/24HR TD PT24: 14.0000 mg | MEDICATED_PATCH | Freq: Every day | TRANSDERMAL | 0 refills | Status: DC

## 2018-06-09 NOTE — Discharge Summary (Signed)
[facility] NAME: Kathryn Ware    MR#:  [id number]  DATE OF BIRTH:  April 07, 1941  DATE OF ADMISSION:  06/07/2018 ADMITTING PHYSICIAN: Demetrios Loll, MD  DATE OF DISCHARGE: 06/09/18  PRIMARY CARE PHYSICIAN: Venia Carbon, MD    ADMISSION DIAGNOSIS:  Gastrointestinal hemorrhage, unspecified gastrointestinal hemorrhage type [K92.2]  DISCHARGE DIAGNOSIS:  Active Problems:   GIB (gastrointestinal bleeding)   SECONDARY DIAGNOSIS:   Past Medical History:  Diagnosis Date  . Anemia   . ARF (acute renal failure) ([place])   . Blood transfusion without reported diagnosis   . Diabetes mellitus type II   . GI (gastrointestinal bleed)   . GI AVM (gastrointestinal arteriovenous vascular malformation)    stomach  . History of echocardiogram 12/2013   a. 12/2013: EF 60-65%, DD, mild LVH, nl RV size & systolic function, mild MR/TR, mild AS, nl RVSP; b. TTE 04/2017: EF 60-65%, normal wall motion, GR1DD, mild MR, left atrium normal in size, normal RV systolic function, PASP normal   . History of nuclear stress test    a. 12/2013: low risk, no sig WMA, nondiag EKG 2/2 baseline LVH w/ repol abnl, no sig ischemia, EF 70% no artifact  . HLD (hyperlipidemia)   . HTN (hypertension)   . Hypercholesterolemia   . IDA (iron deficiency anemia)   . Polyp of colon, adenomatous   . RLS (restless legs syndrome)     HOSPITAL COURSE:   HPI Kathryn Ware  is a 77 y.o. female with a known history of GI bleeding, AVM, anemia, hypertension, diabetes, hyperlipidemia and RLS.  The patient presented the ED with above chief complaint.  She also complains of melena, dizziness and the chest tightness.  She has similar symptoms when she needed blood transfusions in the past.  She was found low hemoglobin 6.5.  The previous hemoglobin was 12.2.  Dr. Corky Downs requests admission.  Symptomatic anemia due to acute blood loss secondary to GI bleeding from AV malformations in the  small bowel PRBC 2 units transfusion follow-up hemoglobin.  Hemoglobin 6.5-7.2-9.7-9.9 .  Discontinue taking NSAIDs and aspirin PPI twice daily  , continue Carafate Ferritin 16 and saturation 16.  Patient has received IV iron yesterday and today and will discharge with p.o. iron Patient seen by gastroenterology had push enteroscopy which has revealed bleeding small bowel AV malformations Outpatient follow-up with gastroenterology Dr. Marius Ditch in 2 weeks Patient is hemodynamically stable and tolerating advanced diet okay to discharge patient from GI standpoint  Hypotension.   Improved with IV fluids   Hypertension. RESUME  lisinopril hydrochlorothiazide   Dehydration.  Improved with IV fluids . Diabetes. sliding scale.  Tobacco abuse.Smoking cessation was counseled for 3 to 4 minutes. Patient agreeable to nicotine patch will provide nicotine patch prescription   Discharge patient home  DISCHARGE CONDITIONS:   stable  CONSULTS OBTAINED:  Treatment Team:  Lin Landsman, MD [name], MD   PROCEDURES    DRUG ALLERGIES:   Allergies  Allergen Reactions  . Metformin     REACTION: diarrhea when dosage is increased, patient aware that she is onmetformin when it is listed that she is allergic  . Nifedipine     REACTION: cough    DISCHARGE MEDICATIONS:   Allergies as of 06/09/2018      Reactions   Metformin    REACTION: diarrhea when dosage is increased, patient aware that she is onmetformin when it is listed that she is allergic  Nifedipine    REACTION: cough      Medication List    TAKE these medications   acetaminophen 325 MG tablet Commonly known as:  TYLENOL Take 2 tablets (650 mg total) by mouth every 6 (six) hours as needed for mild pain (or Fever >/= 101).   clonazePAM 0.5 MG tablet Commonly known as:  KLONOPIN TAKE 1 TABLET(0.5 MG) BY MOUTH DAILY AS NEEDED   docusate sodium 100 MG capsule Commonly known as:  COLACE Take 1 capsule  (100 mg total) by mouth 2 (two) times daily.   donepezil 5 MG tablet Commonly known as:  ARICEPT Take 1 tablet (5 mg total) by mouth at bedtime.   feeding supplement (ENSURE ENLIVE) Liqd Take 237 mLs by mouth 2 (two) times daily between meals.   ferrous sulfate 325 (65 FE) MG tablet Take 1 tablet (325 mg total) by mouth 2 (two) times daily.   lisinopril-hydrochlorothiazide 20-12.5 MG tablet Commonly known as:  PRINZIDE,ZESTORETIC Take 1 tablet by mouth daily. What changed:  how much to take   metFORMIN 500 MG 24 hr tablet Commonly known as:  GLUCOPHAGE-XR Take 1 tablet (500 mg total) by mouth daily with breakfast.   nicotine 14 mg/24hr patch Commonly known as:  NICODERM CQ - dosed in mg/24 hours Place 1 patch (14 mg total) onto the skin daily. Start taking on:  06/10/2018   omeprazole 40 MG capsule Commonly known as:  PRILOSEC TAKE 1 CAPSULE(40 MG) BY MOUTH TWICE DAILY BEFORE A MEAL   ONE TOUCH ULTRA TEST test strip Generic drug:  glucose blood TEST ONCE DAILY AS DIRECTED   simvastatin 80 MG tablet Commonly known as:  ZOCOR Take 80 mg by mouth at bedtime.   sucralfate 1 g tablet Commonly known as:  CARAFATE Take 1 g by mouth 4 (four) times daily as needed. For indigestion   vitamin B-12 1000 MCG tablet Commonly known as:  CYANOCOBALAMIN Take 1,000 mcg by mouth daily.        DISCHARGE INSTRUCTIONS:  Follow-up with primary care physician in 3 days Follow-up with gastroenterology Dr. Marius Ditch in 2 weeks Stop taking NSAIDs and aspirin   DIET:  Diabetic diet  DISCHARGE CONDITION:  Stable  ACTIVITY:  Activity as tolerated  OXYGEN:  Home Oxygen: No.   Oxygen Delivery: room air  DISCHARGE LOCATION:  home   If you experience worsening of your admission symptoms, develop shortness of breath, life threatening emergency, suicidal or homicidal thoughts you must seek medical attention immediately by calling 911 or calling your MD immediately  if symptoms less  severe.  You Must read complete instructions/literature along with all the possible adverse reactions/side effects for all the Medicines you take and that have been prescribed to you. Take any new Medicines after you have completely understood and accpet all the possible adverse reactions/side effects.   Please note  You were cared for by a hospitalist during your hospital stay. If you have any questions about your discharge medications or the care you received while you were in the hospital after you are discharged, you can call the unit and asked to speak with the hospitalist on call if the hospitalist that took care of you is not available. Once you are discharged, your primary care physician will handle any further medical issues. Please note that NO REFILLS for any discharge medications will be authorized once you are discharged, as it is imperative that you return to your primary care physician (or establish a relationship with a primary  care physician if you do not have one) for your aftercare needs so that they can reassess your need for medications and monitor your lab values.     Today  Chief Complaint  Patient presents with  . Chest Pain  . Shortness of Breath   Patient is feeling fine denies any abdominal pain.  Tolerating advanced diet and okay to discharge patient from GI standpoint as she is hemodynamically stable  ROS:  CONSTITUTIONAL: Denies fevers, chills. Denies any fatigue, weakness.  EYES: Denies blurry vision, double vision, eye pain. EARS, NOSE, THROAT: Denies tinnitus, ear pain, hearing loss. RESPIRATORY: Denies cough, wheeze, shortness of breath.  CARDIOVASCULAR: Denies chest pain, palpitations, edema.  GASTROINTESTINAL: Denies nausea, vomiting, diarrhea, abdominal pain. Denies bright red blood per rectum. GENITOURINARY: Denies dysuria, hematuria. ENDOCRINE: Denies nocturia or thyroid problems. HEMATOLOGIC AND LYMPHATIC: Denies easy bruising or bleeding. SKIN:  Denies rash or lesion. MUSCULOSKELETAL: Denies pain in neck, back, shoulder, knees, hips or arthritic symptoms.  NEUROLOGIC: Denies paralysis, paresthesias.  PSYCHIATRIC: Denies anxiety or depressive symptoms.   VITAL SIGNS:  Blood pressure (!) 147/57, pulse 97, temperature 99.8 F (37.7 C), temperature source Oral, resp. rate 18, height 5\' 4"  (1.626 m), weight 50.8 kg, SpO2 99 %.  I/O:    Intake/Output Summary (Last 24 hours) at 06/09/2018 1230 Last data filed at 06/09/2018 1150 Gross per 24 hour  Intake 1474.4 ml  Output 800 ml  Net 674.4 ml    PHYSICAL EXAMINATION:  GENERAL:  77 y.o.-year-old patient lying in the bed with no acute distress.  EYES: Pupils equal, round, reactive to light and accommodation. No scleral icterus. Extraocular muscles intact.  HEENT: Head atraumatic, normocephalic. Oropharynx and nasopharynx clear.  NECK:  Supple, no jugular venous distention. No thyroid enlargement, no tenderness.  LUNGS: Normal breath sounds bilaterally, no wheezing, rales,rhonchi or crepitation. No use of accessory muscles of respiration.  CARDIOVASCULAR: S1, S2 normal. No murmurs, rubs, or gallops.  ABDOMEN: Soft, non-tender, non-distended. Bowel sounds present. No organomegaly or mass.  EXTREMITIES: No pedal edema, cyanosis, or clubbing.  NEUROLOGIC: Cranial nerves II through XII are intact. Muscle strength 5/5 in all extremities. Sensation intact. Gait not checked.  PSYCHIATRIC: The patient is alert and oriented x 3.  SKIN: No obvious rash, lesion, or ulcer.   DATA REVIEW:   CBC Recent Labs  Lab 06/09/18 0008 06/09/18 0805  WBC 8.8  --   HGB 9.7* 9.9*  HCT 28.2* 29.2*  PLT 154  --     Chemistries  Recent Labs  Lab 06/09/18 0008  NA 142  K 3.8  CL 114*  CO2 19*  GLUCOSE 76  BUN 18  CREATININE 1.02*  CALCIUM 8.8*    Cardiac Enzymes Recent Labs  Lab 06/07/18 1408  TROPONINI 0.03*    Microbiology Results  No results found for this or any previous  visit.  RADIOLOGY:  Dg Chest 2 View  Result Date: 06/07/2018 CLINICAL DATA:  Chest pain and shortness of breath EXAM: CHEST - 2 VIEW COMPARISON:  July 18, 2017 FINDINGS: There is no edema or consolidation. The heart size and pulmonary vascularity are normal. No adenopathy. There is aortic atherosclerosis. No evident bone lesions. There is thoracolumbar dextroscoliosis. IMPRESSION: Aortic atherosclerosis.  No edema or consolidation. Aortic Atherosclerosis (ICD10-I70.0). Electronically Signed   By: Lowella Grip III M.D.   On: 06/07/2018 14:29    EKG:   Orders placed or performed during the hospital encounter of 06/07/18  . EKG 12-Lead  . EKG 12-Lead  .  ED EKG within 10 minutes  . ED EKG within 10 minutes      Management plans discussed with the patient, family and they are in agreement.  CODE STATUS:     Code Status Orders  (From admission, onward)         Start     Ordered   06/07/18 1746  Full code  Continuous     06/07/18 1745        Code Status History    Date Active Date Inactive Code Status Order ID Comments User Context   07/18/2017 1823 07/19/2017 2110 Full Code [id number]  Baxter Hire, MD Inpatient   07/14/2017 2248 07/15/2017 1752 Full Code [id number]  Lance Coon, MD Inpatient   05/10/2017 2000 05/12/2017 1404 Full Code [id number]  Vaughan Basta, MD Inpatient   07/05/2016 1301 07/08/2016 1402 Full Code [id number]  Fritzi Mandes, MD Inpatient   10/13/2015 2053 10/14/2015 1636 Full Code [id number]  Dustin Flock, MD Inpatient   03/10/2015 2131 03/13/2015 1311 Full Code [id number]  Theodoro Grist, MD Inpatient      TOTAL TIME TAKING CARE OF THIS PATIENT: 42 minutes.   Note: This dictation was prepared with Dragon dictation along with smaller phrase technology. Any transcriptional errors that result from this process are unintentional.   @[facility] @  on 06/09/2018 at 12:30 PM  Between 7am to 6pm - Pager - [phone]  After 6pm go to [email or web address] -  password [email or web address] [facility]  Office  [phone]  CC: Primary care physician; Venia Carbon, MD

## 2018-06-09 NOTE — Progress Notes (Signed)
Kathryn Ware , MD [address], [place], [place], [place], [address] [address], [place], [place], [place], [address] Phone: [phone]  Fax: [phone]   Kathryn Ware is being followed for Melena  Day 1 of follow up   Subjective: Doing well , brown stools, having lunch when I saw her, no abdominal pain    Objective: Vital signs in last 24 hours: Vitals:   06/08/18 1846 06/08/18 1900 06/08/18 1939 06/09/18 0510  BP: (!) 143/65 (!) 145/60 (!) 164/80 (!) 147/57  Pulse: 84 87 84 97  Resp: (!) 28 16 18 18   Temp:  97.6 F (36.4 C) 97.7 F (36.5 C) 99.8 F (37.7 C)  TempSrc:  Oral Oral Oral  SpO2: 94% 94% 100% 99%  Weight:      Height:       Weight change:   Intake/Output Summary (Last 24 hours) at 06/09/2018 0851 Last data filed at 06/09/2018 6010 Gross per 24 hour  Intake 1675.4 ml  Output 800 ml  Net 875.4 ml     Exam: Heart:: Regular rate and rhythm, S1S2 present or without murmur or extra heart sounds Lungs: normal, clear to auscultation and clear to auscultation and percussion Abdomen: soft, nontender, normal bowel sounds   Lab Results: @LABTEST2 @ Micro Results: No results found for this or any previous visit (from the past 240 hour(s)). Studies/Results: Dg Chest 2 View  Result Date: 06/07/2018 CLINICAL DATA:  Chest pain and shortness of breath EXAM: CHEST - 2 VIEW COMPARISON:  July 18, 2017 FINDINGS: There is no edema or consolidation. The heart size and pulmonary vascularity are normal. No adenopathy. There is aortic atherosclerosis. No evident bone lesions. There is thoracolumbar dextroscoliosis. IMPRESSION: Aortic atherosclerosis.  No edema or consolidation. Aortic Atherosclerosis (ICD10-I70.0). Electronically Signed   By: Lowella Grip III M.D.   On: 06/07/2018 14:29   Medications: I have reviewed the patient's current medications. Scheduled Meds: . atorvastatin  40 mg Oral q1800  . clonazePAM  0.5 mg Oral QHS  . donepezil  5 mg Oral  QHS  . feeding supplement (ENSURE ENLIVE)  237 mL Oral BID BM  . insulin aspart  0-5 Units Subcutaneous QHS  . insulin aspart  0-9 Units Subcutaneous TID WC  . lisinopril  5 mg Oral Daily  . nicotine  14 mg Transdermal Daily  . sucralfate  1 g Oral TID WC & HS  . vitamin B-12  1,000 mcg Oral Daily   Continuous Infusions: . famotidine (PEPCID) IV     PRN Meds:.acetaminophen **OR** acetaminophen, albuterol, bisacodyl, clonazePAM, HYDROcodone-acetaminophen, ondansetron **OR** ondansetron (ZOFRAN) IV, phenol, senna-docusate   Assessment: Active Problems:   GIB (gastrointestinal bleeding)  CBC Latest Ref Rng & Units 06/09/2018 06/09/2018 06/08/2018  WBC 3.6 - 11.0 K/uL - 8.8 -  Hemoglobin 12.0 - 16.0 g/dL 9.9(L) 9.7(L) 10.3(L)  Hematocrit 35.0 - 47.0 % 29.2(L) 28.2(L) 30.8(L)  Platelets 150 - 440 K/uL - 154 -     Kathryn Ware 77 y.o. female admitted with melena.  Patient of Dr. Vira Agar as an outpatient.  Known to have iron deficiency anemia from small bowel AVMs.  On oral iron and stools are always black.  Push enteroscopy performed on 06/08/2018 showed small bowel AVMs which were treated with APC.  Not had a capsule study of the small bowel.  Looking back at her lab work she has never had a normal hemoglobin in the past 1 year.  This suggests that she has probably continuous ongoing bleed.  In addition to upper and lower GI evaluation she also needs outpatient evaluation of the small bowel.  A positive or negative stool occult test will not rule in or rule out a small bowel bleed.  Plan: 1.  Dose of IV iron.  Outpatient follow-up with hematology for iron 2.  No NSAIDs. 3.  PPI. 4. Stable - can go home later today if has no further evidence of bleed and follow up with Dr Allena Earing in a few days time for CBC and Capsule study   I will sign off.  Please call me if any further GI concerns or questions.  We would like to thank you for the opportunity to participate in the care of Kathryn Ware.        LOS: 2 days   Kathryn Bellows, MD 06/09/2018, 8:51 AM

## 2018-06-09 NOTE — Discharge Instructions (Signed)
Follow-up with primary care physician in 3 days Follow-up with gastroenterology Dr. Marius Ditch in 2 weeks Stop taking NSAIDs and aspirin

## 2018-06-09 NOTE — Progress Notes (Signed)
Nsg Discharge Note  Admit Date:  06/07/2018 Discharge date: 06/09/2018   [name] to be D/C'd Home per MD order.  AVS completed.  Copy for chart, and copy for patient signed, and dated. Patient/caregiver able to verbalize understanding.  Discharge Medication: Allergies as of 06/09/2018      Reactions   Metformin    REACTION: diarrhea when dosage is increased, patient aware that she is onmetformin when it is listed that she is allergic   Nifedipine    REACTION: cough      Medication List    TAKE these medications   acetaminophen 325 MG tablet Commonly known as:  TYLENOL Take 2 tablets (650 mg total) by mouth every 6 (six) hours as needed for mild pain (or Fever >/= 101).   clonazePAM 0.5 MG tablet Commonly known as:  KLONOPIN TAKE 1 TABLET(0.5 MG) BY MOUTH DAILY AS NEEDED   docusate sodium 100 MG capsule Commonly known as:  COLACE Take 1 capsule (100 mg total) by mouth 2 (two) times daily.   donepezil 5 MG tablet Commonly known as:  ARICEPT Take 1 tablet (5 mg total) by mouth at bedtime.   feeding supplement (ENSURE ENLIVE) Liqd Take 237 mLs by mouth 2 (two) times daily between meals.   ferrous sulfate 325 (65 FE) MG tablet Take 1 tablet (325 mg total) by mouth 2 (two) times daily.   lisinopril-hydrochlorothiazide 20-12.5 MG tablet Commonly known as:  PRINZIDE,ZESTORETIC Take 1 tablet by mouth daily. What changed:  how much to take   metFORMIN 500 MG 24 hr tablet Commonly known as:  GLUCOPHAGE-XR Take 1 tablet (500 mg total) by mouth daily with breakfast.   nicotine 14 mg/24hr patch Commonly known as:  NICODERM CQ - dosed in mg/24 hours Place 1 patch (14 mg total) onto the skin daily. Start taking on:  06/10/2018   omeprazole 40 MG capsule Commonly known as:  PRILOSEC TAKE 1 CAPSULE(40 MG) BY MOUTH TWICE DAILY BEFORE A MEAL   ONE TOUCH ULTRA TEST test strip Generic drug:  glucose blood TEST ONCE DAILY AS DIRECTED   simvastatin 80 MG tablet Commonly  known as:  ZOCOR Take 80 mg by mouth at bedtime.   sucralfate 1 g tablet Commonly known as:  CARAFATE Take 1 g by mouth 4 (four) times daily as needed. For indigestion   vitamin B-12 1000 MCG tablet Commonly known as:  CYANOCOBALAMIN Take 1,000 mcg by mouth daily.       Discharge Assessment: Vitals:   06/08/18 1939 06/09/18 0510  BP: (!) 164/80 (!) 147/57  Pulse: 84 97  Resp: 18 18  Temp: 97.7 F (36.5 C) 99.8 F (37.7 C)  SpO2: 100% 99%   Skin clean, dry and intact without evidence of skin break down, no evidence of skin tears noted. IV catheter discontinued intact. Site without signs and symptoms of complications - no redness or edema noted at insertion site, patient denies c/o pain - only slight tenderness at site.  Dressing with slight pressure applied.  D/c Instructions-Education: Discharge instructions given to patient/family with verbalized understanding. D/c education completed with patient/family including follow up instructions, medication list, d/c activities limitations if indicated, with other d/c instructions as indicated by MD - patient able to verbalize understanding, all questions fully answered. Patient instructed to return to ED, call 911, or call MD for any changes in condition.  Patient escorted via [place], and D/C home via private auto.  Eda Keys, RN 06/09/2018 12:42 PM

## 2018-06-11 ENCOUNTER — Encounter: Payer: Self-pay | Admitting: Gastroenterology

## 2018-06-12 ENCOUNTER — Encounter: Payer: Self-pay | Admitting: Internal Medicine

## 2018-06-12 ENCOUNTER — Ambulatory Visit: Payer: Medicare Other | Admitting: Internal Medicine

## 2018-06-12 DIAGNOSIS — D500 Iron deficiency anemia secondary to blood loss (chronic): Secondary | ICD-10-CM

## 2018-06-12 NOTE — Assessment & Plan Note (Addendum)
Repeated AVM bleeding Rx in hospital Discussed bowel regimen or trying children's iron to tolerate Will recheck labs in 1 month Continue PPI and sucralfate May want to consider iron infusions if hemoglobin not up well

## 2018-06-12 NOTE — Patient Instructions (Signed)
You can try senna-s (over the counter) 2 tabs daily if you are constipated.

## 2018-06-12 NOTE — Progress Notes (Signed)
Subjective:    Patient ID: Kathryn Ware, female    DOB: Jan 29, 1941, 77 y.o.   MRN: [id number]  HPI Here with sisters for hospital follow up Just felt bad--didn't see blood Reviewed hospital records and procedure Was transfused Had push enteroscopy with treatment of 3 small bowel AVMs  Feels some better now No dizziness now Not consistent with the iron pills--- can be constipating  No chest pain No SOB  Current Outpatient Medications on File Prior to Visit  Medication Sig Dispense Refill  . acetaminophen (TYLENOL) 325 MG tablet Take 2 tablets (650 mg total) by mouth every 6 (six) hours as needed for mild pain (or Fever >/= 101).    . clonazePAM (KLONOPIN) 0.5 MG tablet TAKE 1 TABLET(0.5 MG) BY MOUTH DAILY AS NEEDED 30 tablet 0  . donepezil (ARICEPT) 5 MG tablet Take 1 tablet (5 mg total) by mouth at bedtime. 90 tablet 3  . feeding supplement, ENSURE ENLIVE, (ENSURE ENLIVE) LIQD Take 237 mLs by mouth 2 (two) times daily between meals. 60 Bottle 1  . ferrous sulfate 325 (65 FE) MG tablet Take 1 tablet (325 mg total) by mouth 2 (two) times daily. 60 tablet 3  . lisinopril-hydrochlorothiazide (PRINZIDE,ZESTORETIC) 20-12.5 MG tablet Take 1 tablet by mouth daily. (Patient taking differently: Take 2 tablets by mouth daily. ) 1 tablet 0  . metFORMIN (GLUCOPHAGE-XR) 500 MG 24 hr tablet Take 1 tablet (500 mg total) by mouth daily with breakfast. 30 tablet 11  . nicotine (NICODERM CQ - DOSED IN MG/24 HOURS) 14 mg/24hr patch Place 1 patch (14 mg total) onto the skin daily. 28 patch 0  . omeprazole (PRILOSEC) 40 MG capsule TAKE 1 CAPSULE(40 MG) BY MOUTH TWICE DAILY BEFORE A MEAL 180 capsule 3  . ONE TOUCH ULTRA TEST test strip TEST ONCE DAILY AS DIRECTED 100 each 3  . simvastatin (ZOCOR) 80 MG tablet Take 80 mg by mouth at bedtime.    . sucralfate (CARAFATE) 1 g tablet Take 1 g by mouth 4 (four) times daily as needed. For indigestion    . vitamin B-12 (CYANOCOBALAMIN) 1000 MCG tablet Take 1,000  mcg by mouth daily.     No current facility-administered medications on file prior to visit.     Allergies  Allergen Reactions  . Metformin     REACTION: diarrhea when dosage is increased, patient aware that she is onmetformin when it is listed that she is allergic  . Nifedipine     REACTION: cough    Past Medical History:  Diagnosis Date  . Anemia   . ARF (acute renal failure) ([place])   . Blood transfusion without reported diagnosis   . Diabetes mellitus type II   . GI (gastrointestinal bleed)   . GI AVM (gastrointestinal arteriovenous vascular malformation)    stomach  . History of echocardiogram 12/2013   a. 12/2013: EF 60-65%, DD, mild LVH, nl RV size & systolic function, mild MR/TR, mild AS, nl RVSP; b. TTE 04/2017: EF 60-65%, normal wall motion, GR1DD, mild MR, left atrium normal in size, normal RV systolic function, PASP normal   . History of nuclear stress test    a. 12/2013: low risk, no sig WMA, nondiag EKG 2/2 baseline LVH w/ repol abnl, no sig ischemia, EF 70% no artifact  . HLD (hyperlipidemia)   . HTN (hypertension)   . Hypercholesterolemia   . IDA (iron deficiency anemia)   . Polyp of colon, adenomatous   . RLS (restless legs syndrome)  Past Surgical History:  Procedure Laterality Date  . ABDOMINAL ADHESION SURGERY  11/2001   Dr. Tamala Julian  . ABDOMINAL HYSTERECTOMY  1976   RSO (fibroid)  . COLONOSCOPY WITH PROPOFOL N/A 07/07/2016   Procedure: COLONOSCOPY WITH PROPOFOL;  Surgeon: Manya Silvas, MD;  Location: [facility] ENDOSCOPY;  Service: Endoscopy;  Laterality: N/A;  . COLONOSCOPY WITH PROPOFOL N/A 12/14/2016   Procedure: COLONOSCOPY WITH PROPOFOL;  Surgeon: Manya Silvas, MD;  Location[facility] ENDOSCOPY;  Service: Endoscopy;  Laterality: N/A;  . DOBUTAMINE STRESS ECHO  11/10   normal  . DOPPLER ECHOCARDIOGRAPHY     EF 55%, mild MR, TR  . ENTEROSCOPY N/A 06/08/2018   Procedure: ENTEROSCOPY;  Surgeon: Lin Landsman, MD;  Location: [facility] ENDOSCOPY;  Service:  Gastroenterology;  Laterality: N/A;  . ESOPHAGOGASTRODUODENOSCOPY  07/2006   multiple angioectasias  . ESOPHAGOGASTRODUODENOSCOPY Left 03/12/2015   Procedure: place tube in throat and evaluate stomach and duodenum for source of bleeding. Cauterize if concern for rebleeding.;  Surgeon: Hulen Luster, MD;  Location: [facility] ENDOSCOPY;  Service: Endoscopy;  Laterality: Left;  . ESOPHAGOGASTRODUODENOSCOPY N/A 12/14/2016   Procedure: ESOPHAGOGASTRODUODENOSCOPY (EGD);  Surgeon: Manya Silvas, MD;  Location: [facility] ENDOSCOPY;  Service: Endoscopy;  Laterality: N/A;  . ESOPHAGOGASTRODUODENOSCOPY N/A 07/19/2017   Procedure: ESOPHAGOGASTRODUODENOSCOPY (EGD);  Surgeon: Lin Landsman, MD;  Location: [facility] ENDOSCOPY;  Service: Gastroenterology;  Laterality: N/A;  . ESOPHAGOGASTRODUODENOSCOPY (EGD) WITH PROPOFOL N/A 11/23/2015   Procedure: ESOPHAGOGASTRODUODENOSCOPY (EGD) WITH PROPOFOL;  Surgeon: Hulen Luster, MD;  Location: [facility] ENDOSCOPY;  Service: Gastroenterology;  Laterality: N/A;  . ESOPHAGOGASTRODUODENOSCOPY (EGD) WITH PROPOFOL N/A 07/07/2016   Procedure: ESOPHAGOGASTRODUODENOSCOPY (EGD) WITH PROPOFOL;  Surgeon: Manya Silvas, MD;  Location: [facility] ENDOSCOPY;  Service: Endoscopy;  Laterality: N/A;  . ESOPHAGOGASTRODUODENOSCOPY (EGD) WITH PROPOFOL N/A 05/19/2017   Procedure: ESOPHAGOGASTRODUODENOSCOPY (EGD) WITH PROPOFOL;  Surgeon: Manya Silvas, MD;  Location: [facility] ENDOSCOPY;  Service: Endoscopy;  Laterality: N/A;  . laryngeal polyp  10/2008   Dr. Tami Ribas  . TOP      Family History  Problem Relation Age of Onset  . Stroke Father   . Cancer Sister        breast cancer  . Hypertension Mother   . Hypertension Unknown        sibling  . Diabetes Unknown        grandmother    Social History   Socioeconomic History  . Marital status: Widowed    Spouse name: Not on file  . Number of children: 1  . Years of education: Not on file  . Highest education level: Not on file  Occupational History  .  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 Financial resource strain: Not on file  . Food insecurity:    Worry: Not on file    Inability: Not on file  . Transportation needs:    Medical: Not on file    Non-medical: Not on file  Tobacco Use  . Smoking status: Current Every Day Smoker    Packs/day: 1.00    Types: Cigarettes  . Smokeless tobacco: Never Used  Substance and Sexual Activity  . Alcohol use: No  . Drug use: No  . Sexual activity: Not on file  Lifestyle  . Physical activity:    Days per week: Not on file    Minutes per session: Not on file  . Stress: Not on file  Relationships  . Social connections:    Talks on phone: Not  on file    Gets together: Not on file    Attends religious service: Not on file    Active member of club or organization: Not on file    Attends meetings of clubs or organizations: Not on file    Relationship status: Not on file  . Intimate partner violence:    Fear of current or ex partner: Not on file    Emotionally abused: Not on file    Physically abused: Not on file    Forced sexual activity: Not on file  Other Topics Concern  . Not on file  Social History Narrative   No living will   Requests son Orson Gear as health care POA   Would accept resuscitation --but no prolonged machines   Not sure about feeding tubes   Review of Systems Appetite is okay Weight stable    Objective:   Physical Exam  Constitutional: She appears well-developed. No distress.  Neck: No thyromegaly present.  Cardiovascular: Normal rate, regular rhythm and normal heart sounds. Exam reveals no gallop.  No murmur heard. Respiratory: Effort normal and breath sounds normal. No respiratory distress. She has no wheezes. She has no rales.  GI: Soft. She exhibits no distension. There is no tenderness. There is no rebound.  Musculoskeletal: She exhibits no edema.  Lymphadenopathy:    She has no cervical adenopathy.           Assessment &  Plan:

## 2018-06-21 ENCOUNTER — Other Ambulatory Visit: Payer: Self-pay | Admitting: Internal Medicine

## 2018-06-22 NOTE — Telephone Encounter (Signed)
Name of Medication: KLONOPIN 0.5 MG tablet  Name of Pharmacy: [place] or Written Date and Quantity: last filled 05/17/2018 #30 Last Office Visit and Type: 06/12/2018 Acute Next Office Visit and Type: 08/2018 for AWV

## 2018-06-29 ENCOUNTER — Other Ambulatory Visit: Payer: Self-pay | Admitting: Internal Medicine

## 2018-07-13 ENCOUNTER — Other Ambulatory Visit: Payer: Medicare Other

## 2018-07-21 ENCOUNTER — Other Ambulatory Visit: Payer: Self-pay | Admitting: Internal Medicine

## 2018-07-23 NOTE — Telephone Encounter (Signed)
Last filled 06-22-18 #30 Last OV 06-12-18 Next OV 09-05-18

## 2018-08-27 ENCOUNTER — Other Ambulatory Visit: Payer: Self-pay | Admitting: Internal Medicine

## 2018-08-27 NOTE — Telephone Encounter (Signed)
Last filled 07-23-18 #30 Last OV 06-12-18 Next OV 09-05-18

## 2018-08-28 ENCOUNTER — Other Ambulatory Visit: Payer: Self-pay | Admitting: Internal Medicine

## 2018-08-28 DIAGNOSIS — D500 Iron deficiency anemia secondary to blood loss (chronic): Secondary | ICD-10-CM

## 2018-09-05 ENCOUNTER — Ambulatory Visit (INDEPENDENT_AMBULATORY_CARE_PROVIDER_SITE_OTHER): Payer: Medicare Other | Admitting: Internal Medicine

## 2018-09-05 ENCOUNTER — Encounter: Payer: Self-pay | Admitting: Internal Medicine

## 2018-09-05 ENCOUNTER — Other Ambulatory Visit: Payer: Medicare Other

## 2018-09-05 VITALS — BP 132/68 | HR 88 | Ht 63.25 in | Wt 120.0 lb

## 2018-09-05 DIAGNOSIS — I10 Essential (primary) hypertension: Secondary | ICD-10-CM

## 2018-09-05 DIAGNOSIS — Z0000 Encounter for general adult medical examination without abnormal findings: Secondary | ICD-10-CM | POA: Diagnosis not present

## 2018-09-05 DIAGNOSIS — Z7189 Other specified counseling: Secondary | ICD-10-CM

## 2018-09-05 DIAGNOSIS — G3184 Mild cognitive impairment, so stated: Secondary | ICD-10-CM

## 2018-09-05 DIAGNOSIS — I5032 Chronic diastolic (congestive) heart failure: Secondary | ICD-10-CM

## 2018-09-05 DIAGNOSIS — E118 Type 2 diabetes mellitus with unspecified complications: Secondary | ICD-10-CM

## 2018-09-05 LAB — CBC
HEMATOCRIT: 36.9 % (ref 36.0–46.0)
HEMOGLOBIN: 12.3 g/dL (ref 12.0–15.0)
MCHC: 33.2 g/dL (ref 30.0–36.0)
MCV: 85.6 fl (ref 78.0–100.0)
PLATELETS: 241 10*3/uL (ref 150.0–400.0)
RBC: 4.31 Mil/uL (ref 3.87–5.11)
RDW: 12.8 % (ref 11.5–15.5)
WBC: 4.8 10*3/uL (ref 4.0–10.5)

## 2018-09-05 LAB — LIPID PANEL
CHOLESTEROL: 138 mg/dL (ref 0–200)
HDL: 38.3 mg/dL — ABNORMAL LOW (ref >39.00)
LDL CALC: 83 mg/dL (ref 0–99)
NonHDL: 99.92
TRIGLYCERIDES: 83 mg/dL (ref 0.0–149.0)
Total CHOL/HDL Ratio: 4
VLDL: 16.6 mg/dL (ref 0.0–40.0)

## 2018-09-05 LAB — HEMOGLOBIN A1C: HEMOGLOBIN A1C: 6 % (ref 4.6–6.5)

## 2018-09-05 LAB — HM DIABETES FOOT EXAM

## 2018-09-05 NOTE — Assessment & Plan Note (Signed)
Seems to have good control Vascular complications Needs eye exam

## 2018-09-05 NOTE — Assessment & Plan Note (Addendum)
I have personally reviewed the Medicare Annual Wellness questionnaire and have noted 1. The patient's medical and social history 2. Their use of alcohol, tobacco or illicit drugs 3. Their current medications and supplements 4. The patient's functional ability including ADL's, fall risks, home safety risks and hearing or visual             impairment. 5. Diet and physical activities 6. Evidence for depression or mood disorders  The patients weight, height, BMI and visual acuity have been recorded in the chart I have made referrals, counseling and provided education to the patient based review of the above and I have provided the pt with a written personalized care plan for preventive services.  I have provided you with a copy of your personalized plan for preventive services. Please take the time to review along with your updated medication list.  Had flu vaccine Will consider mammogram again next year Discussed some exercise Recommended cigarette cessation or at least cutting down Will come in for tetanus booster if any injury

## 2018-09-05 NOTE — Assessment & Plan Note (Signed)
See social history 

## 2018-09-05 NOTE — Assessment & Plan Note (Signed)
BP Readings from Last 3 Encounters:  09/05/18 132/68  06/12/18 126/70  06/09/18 (!) 147/57   Good control

## 2018-09-05 NOTE — Assessment & Plan Note (Signed)
Seems to be compensated Most evident with severe anemia when she has bleeds

## 2018-09-05 NOTE — Patient Instructions (Signed)
You can stop the donepezil.

## 2018-09-05 NOTE — Assessment & Plan Note (Signed)
No functional decline No change with donepezil---will stop

## 2018-09-05 NOTE — Progress Notes (Signed)
Hearing Screening   Method: Audiometry   125Hz  250Hz  500Hz  1000Hz  2000Hz  3000Hz  4000Hz  6000Hz  8000Hz   Right ear:   20 20 20  20     Left ear:   20 20 20  20       Visual Acuity Screening   Right eye Left eye Both eyes  Without correction:     With correction: 20/50 20/40 20/40

## 2018-09-05 NOTE — Progress Notes (Signed)
Subjective:    Patient ID: Kathryn Ware, female    DOB: [birth year], 77 y.o.   MRN: [id number]  HPI Here for Medicare wellness visit and follow up of chronic health conditions Reviewed form and advanced directives Reviewed other doctors No alcohol Still smokes--would like to stop but just not able No exercise---discussed at least doing some walking, etc No falls No depression or anhedonia Vision is okay Ongoing stable memory issues  Started the donepezil She hasn't noticed any difference Doesn't drive Lives with sister---who drives They share instrumental ADLs  No evidence of GI bleeding No blood in stools or black stool Bowels are regular  Checks sugars every 2-3 days Usually under 120 No hypoglycemic reactions No foot numbness or pain---but she stays cold  No chest pain No SOB No palpitations No headaches No dizziness or syncope No edema  Current Outpatient Medications on File Prior to Visit  Medication Sig Dispense Refill  . acetaminophen (TYLENOL) 325 MG tablet Take 2 tablets (650 mg total) by mouth every 6 (six) hours as needed for mild pain (or Fever >/= 101).    . clonazePAM (KLONOPIN) 0.5 MG tablet TAKE 1 TABLET(0.5 MG) BY MOUTH DAILY AS NEEDED 30 tablet 0  . donepezil (ARICEPT) 5 MG tablet Take 1 tablet (5 mg total) by mouth at bedtime. 90 tablet 3  . feeding supplement, ENSURE ENLIVE, (ENSURE ENLIVE) LIQD Take 237 mLs by mouth 2 (two) times daily between meals. 60 Bottle 1  . ferrous sulfate 325 (65 FE) MG tablet Take 1 tablet (325 mg total) by mouth 2 (two) times daily. 60 tablet 3  . lisinopril-hydrochlorothiazide (PRINZIDE,ZESTORETIC) 20-12.5 MG tablet Take 1 tablet by mouth daily. (Patient taking differently: Take 2 tablets by mouth daily. ) 1 tablet 0  . metFORMIN (GLUCOPHAGE-XR) 500 MG 24 hr tablet Take 1 tablet (500 mg total) by mouth daily with breakfast. 30 tablet 11  . omeprazole (PRILOSEC) 40 MG capsule TAKE 1 CAPSULE(40 MG) BY MOUTH TWICE DAILY  BEFORE A MEAL 180 capsule 3  . ONE TOUCH ULTRA TEST test strip TEST ONCE DAILY AS DIRECTED 100 each 3  . simvastatin (ZOCOR) 80 MG tablet TAKE 1 TABLET(80 MG) BY MOUTH DAILY 90 tablet 0  . sucralfate (CARAFATE) 1 g tablet TAKE 1 TABLET BY MOUTH BEFORE MEALS AND EVERY NIGHT AT BEDTIME 120 tablet 0  . vitamin B-12 (CYANOCOBALAMIN) 1000 MCG tablet Take 1,000 mcg by mouth daily.     No current facility-administered medications on file prior to visit.     Allergies  Allergen Reactions  . Metformin     REACTION: diarrhea when dosage is increased, patient aware that she is onmetformin when it is listed that she is allergic  . Nifedipine     REACTION: cough    Past Medical History:  Diagnosis Date  . Anemia   . ARF (acute renal failure) ([place])   . Blood transfusion without reported diagnosis   . Diabetes mellitus type II   . GI (gastrointestinal bleed)   . GI AVM (gastrointestinal arteriovenous vascular malformation)    stomach  . History of echocardiogram 12/2013   a. 12/2013: EF 60-65%, DD, mild LVH, nl RV size & systolic function, mild MR/TR, mild AS, nl RVSP; b. TTE 04/2017: EF 60-65%, normal wall motion, GR1DD, mild MR, left atrium normal in size, normal RV systolic function, PASP normal   . History of nuclear stress test    a. 12/2013: low risk, no sig WMA, nondiag EKG 2/2 baseline LVH  w/ repol abnl, no sig ischemia, EF 70% no artifact  . HLD (hyperlipidemia)   . HTN (hypertension)   . Hypercholesterolemia   . IDA (iron deficiency anemia)   . Polyp of colon, adenomatous   . RLS (restless legs syndrome)     Past Surgical History:  Procedure Laterality Date  . ABDOMINAL ADHESION SURGERY  11/2001   Dr. Tamala Julian  . ABDOMINAL HYSTERECTOMY  1976   RSO (fibroid)  . COLONOSCOPY WITH PROPOFOL N/A 07/07/2016   Procedure: COLONOSCOPY WITH PROPOFOL;  Surgeon: Manya Silvas, MD;  Location: [facility] ENDOSCOPY;  Service: Endoscopy;  Laterality: N/A;  . COLONOSCOPY WITH PROPOFOL N/A 12/14/2016    Procedure: COLONOSCOPY WITH PROPOFOL;  Surgeon: Manya Silvas, MD;  Location: [facility] ENDOSCOPY;  Service: Endoscopy;  Laterality: N/A;  . DOBUTAMINE STRESS ECHO  11/10   normal  . DOPPLER ECHOCARDIOGRAPHY     EF 55%, mild MR, TR  . ENTEROSCOPY N/A 06/08/2018   Procedure: ENTEROSCOPY;  Surgeon: Lin Landsman, MD;  Location: [facility] ENDOSCOPY;  Service: Gastroenterology;  Laterality: N/A;  . ESOPHAGOGASTRODUODENOSCOPY  07/2006   multiple angioectasias  . ESOPHAGOGASTRODUODENOSCOPY Left 03/12/2015   Procedure: place tube in throat and evaluate stomach and duodenum for source of bleeding. Cauterize if concern for rebleeding.;  Surgeon: Hulen Luster, MD;  Location: [facility] ENDOSCOPY;  Service: Endoscopy;  Laterality: Left;  . ESOPHAGOGASTRODUODENOSCOPY N/A 12/14/2016   Procedure: ESOPHAGOGASTRODUODENOSCOPY (EGD);  Surgeon: Manya Silvas, MD;  Location: [facility] ENDOSCOPY;  Service: Endoscopy;  Laterality: N/A;  . ESOPHAGOGASTRODUODENOSCOPY N/A 07/19/2017   Procedure: ESOPHAGOGASTRODUODENOSCOPY (EGD);  Surgeon: Lin Landsman, MD;  Location: [facility] ENDOSCOPY;  Service: Gastroenterology;  Laterality: N/A;  . ESOPHAGOGASTRODUODENOSCOPY (EGD) WITH PROPOFOL N/A 11/23/2015   Procedure: ESOPHAGOGASTRODUODENOSCOPY (EGD) WITH PROPOFOL;  Surgeon: Hulen Luster, MD;  Location: [facility] ENDOSCOPY;  Service: Gastroenterology;  Laterality: N/A;  . ESOPHAGOGASTRODUODENOSCOPY (EGD) WITH PROPOFOL N/A 07/07/2016   Procedure: ESOPHAGOGASTRODUODENOSCOPY (EGD) WITH PROPOFOL;  Surgeon: Manya Silvas, MD;  Location: [facility] ENDOSCOPY;  Service: Endoscopy;  Laterality: N/A;  . ESOPHAGOGASTRODUODENOSCOPY (EGD) WITH PROPOFOL N/A 05/19/2017   Procedure: ESOPHAGOGASTRODUODENOSCOPY (EGD) WITH PROPOFOL;  Surgeon: Manya Silvas, MD;  Location: [facility] ENDOSCOPY;  Service: Endoscopy;  Laterality: N/A;  . laryngeal polyp  10/2008   Dr. Tami Ribas  . TOP      Family History  Problem Relation Age of Onset  . Stroke Father   . Cancer Sister         breast cancer  . Hypertension Mother   . Hypertension Unknown        sibling  . Diabetes Unknown        grandmother    Social History   Socioeconomic History  . Marital status: Widowed    Spouse name: Not on file  . Number of children: 1  . Years of education: Not on file  . Highest education level: Not on file  Occupational History  . 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 Financial resource strain: Not on file  . Food insecurity:    Worry: Not on file    Inability: Not on file  . Transportation needs:    Medical: Not on file    Non-medical: Not on file  Tobacco Use  . Smoking status: Current Every Day Smoker    Packs/day: 1.00    Types: Cigarettes  . Smokeless tobacco: Never Used  Substance and Sexual Activity  . Alcohol use: No  . Drug use: No  .  Sexual activity: Not on file  Lifestyle  . Physical activity:    Days per week: Not on file    Minutes per session: Not on file  . Stress: Not on file  Relationships  . Social connections:    Talks on phone: Not on file    Gets together: Not on file    Attends religious service: Not on file    Active member of club or organization: Not on file    Attends meetings of clubs or organizations: Not on file    Relationship status: Not on file  . Intimate partner violence:    Fear of current or ex partner: Not on file    Emotionally abused: Not on file    Physically abused: Not on file    Forced sexual activity: Not on file  Other Topics Concern  . Not on file  Social History Narrative   No living will   Requests son Orson Gear as health care POA   Would accept resuscitation --but no prolonged machines   Not sure about feeding tubes   Review of Systems Appetite is okay Has gained back some weight Sleep is variable--uses the clonazepam if having a lot of trouble Wears seat belt Full dentures--no problems No heartburn or dysphagia No dysuria. Is continent Dry skin---nothing  suspicious. No derm No sig back or joint pain    Objective:   Physical Exam  Constitutional: She appears well-developed. No distress.  HENT:  Mouth/Throat: Oropharynx is clear and moist. No oropharyngeal exudate.  Neck: No thyromegaly present.  Cardiovascular: Normal rate, regular rhythm and normal heart sounds. Exam reveals no gallop.  No murmur heard. Faint pulse right foot---absent on left  Respiratory: Effort normal and breath sounds normal. No respiratory distress. She has no wheezes. She has no rales.  GI: Soft. There is no tenderness.  Musculoskeletal: She exhibits no edema or tenderness.  Lymphadenopathy:    She has no cervical adenopathy.  Neurological:  Normal sensation in feet  Skin: No rash noted.  No foot lesions  Psychiatric: She has a normal mood and affect. Her behavior is normal.           Assessment & Plan:

## 2018-09-15 ENCOUNTER — Other Ambulatory Visit: Payer: Self-pay | Admitting: Internal Medicine

## 2018-09-17 DIAGNOSIS — E119 Type 2 diabetes mellitus without complications: Secondary | ICD-10-CM | POA: Diagnosis not present

## 2018-09-17 DIAGNOSIS — H52223 Regular astigmatism, bilateral: Secondary | ICD-10-CM | POA: Diagnosis not present

## 2018-09-17 DIAGNOSIS — H2513 Age-related nuclear cataract, bilateral: Secondary | ICD-10-CM | POA: Diagnosis not present

## 2018-09-17 DIAGNOSIS — H524 Presbyopia: Secondary | ICD-10-CM | POA: Diagnosis not present

## 2018-09-17 LAB — HM DIABETES EYE EXAM

## 2018-09-27 ENCOUNTER — Encounter: Payer: Self-pay | Admitting: Internal Medicine

## 2018-10-10 ENCOUNTER — Other Ambulatory Visit: Payer: Self-pay | Admitting: Internal Medicine

## 2018-10-10 MED ORDER — CLONAZEPAM 0.5 MG PO TABS: 0.5000 mg | ORAL_TABLET | Freq: Every day | ORAL | 0 refills | Status: DC | PRN

## 2018-10-10 MED ORDER — SIMVASTATIN 80 MG PO TABS: ORAL_TABLET | ORAL | 3 refills | Status: DC

## 2018-10-10 NOTE — Telephone Encounter (Signed)
Rx sent electronically for simvastatin.  Clonazepam last filled 08-27-18 #30 Last OV 09-05-18 Next OV 03-06-19

## 2018-10-10 NOTE — Telephone Encounter (Signed)
Pt need refill for  Clonazepam 5.mg Simvastatin 80mg    Sent to [facility]

## 2018-11-12 ENCOUNTER — Other Ambulatory Visit: Payer: Self-pay | Admitting: Internal Medicine

## 2018-11-12 NOTE — Telephone Encounter (Signed)
Clonazepam last filled 10/10/18 #30 Last OV 09-05-18 Next OV 03-06-19

## 2018-12-08 IMAGING — MR MR HEAD W/O CM
10 series · 40 of 48 positions shown · non-contrast
Comparison: None.

CLINICAL DATA: Worsening memory loss over the last 4 months.

EXAM:
MRI HEAD WITHOUT CONTRAST
TECHNIQUE: Multiplanar, multiecho pulse sequences of the brain and surrounding
structures were obtained without intravenous contrast.

[Series 2: T1 · sagittal · 5.0mm · 0.47mm/px · 2 of 23 slices shown (1 of 2)]
[im 1/23]
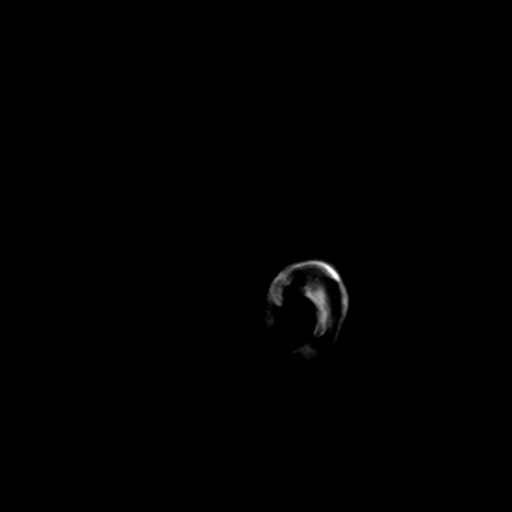
[im 23/23]
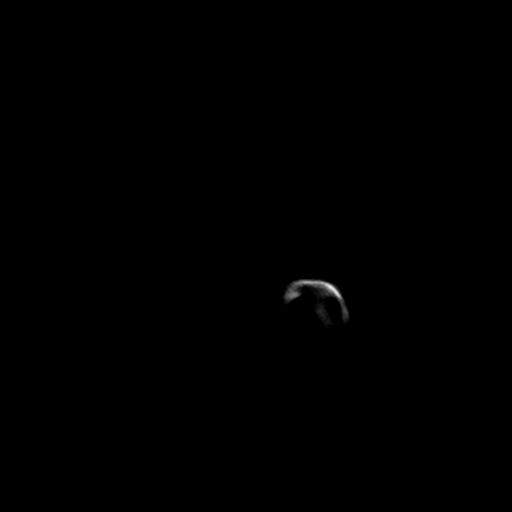

[Series 4: DWI · axial · 3.0mm · 0.94mm/px · z∈[-101,+45]mm · 5 of 50 slices shown (1 of 4)]
[im 1/50]
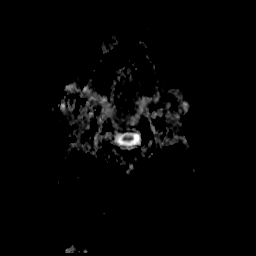
[im 13/50]
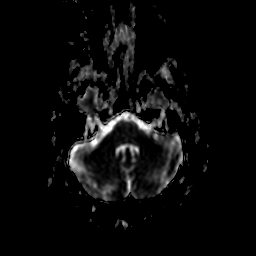
[im 25/50]
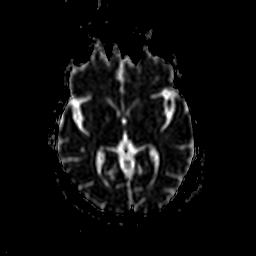
[im 37/50]
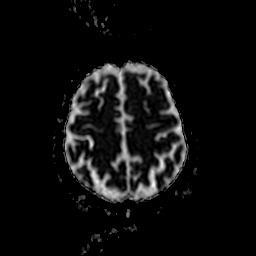
[im 50/50]
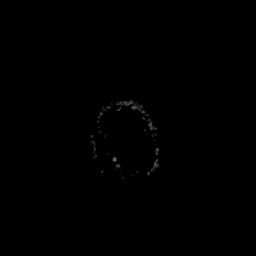

[Series 5: DWI · axial · 3.0mm · 0.94mm/px · z∈[-101,+45]mm · 5 of 49 slices shown (2 of 4)]
[im 1/49]
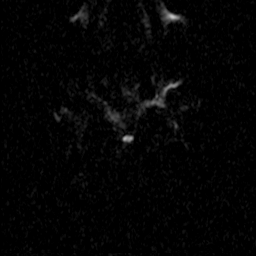
[im 13/49]
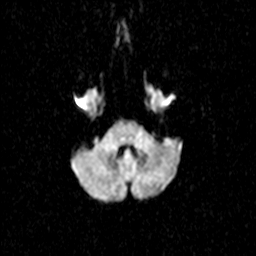
[im 25/49]
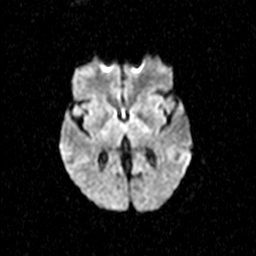
[im 37/49]
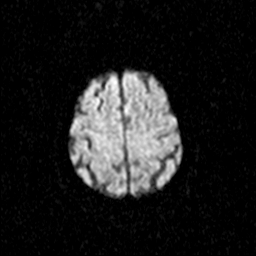
[im 49/49]
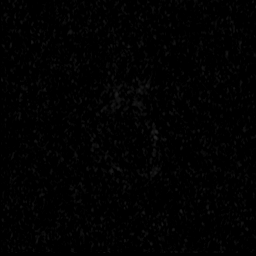

[Series 7: DWI · coronal · 5.0mm · 1.80mm/px · 4 of 39 slices shown (3 of 4)]
[im 1/39]
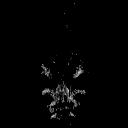
[im 13/39]
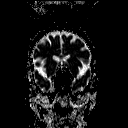
[im 26/39]
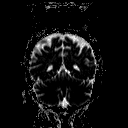
[im 39/39]
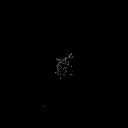

[Series 8: DWI · coronal · 5.0mm · 1.80mm/px · 4 of 39 slices shown (4 of 4)]
[im 1/39]
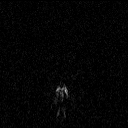
[im 13/39]
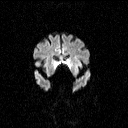
[im 26/39]
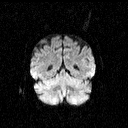
[im 39/39]
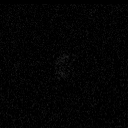

[Series 9: T2 · axial · 5.0mm · 0.45mm/px · z∈[-107,+45]mm · 2 of 23 slices shown (1 of 3)]
[im 1/23]
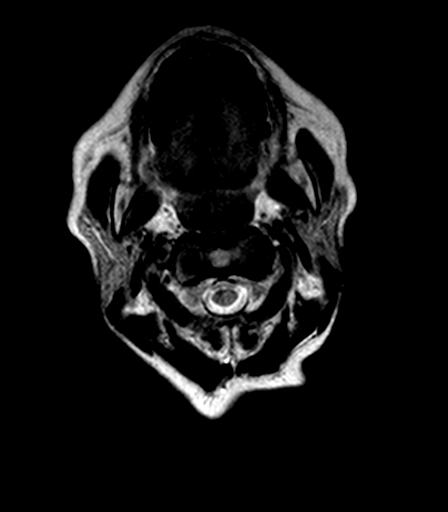
[im 23/23]
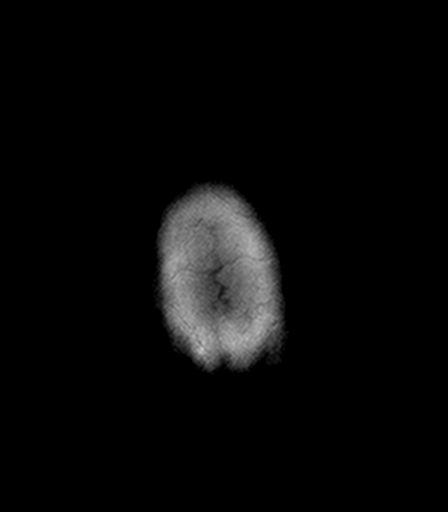

[Series 10: FLAIR · axial · 3.0mm · 0.90mm/px · z∈[-102,+43]mm · 5 of 50 slices shown]
[im 1/50]
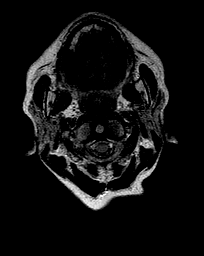
[im 13/50]
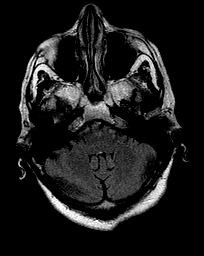
[im 25/50]
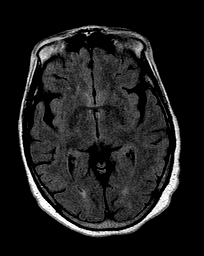
[im 37/50]
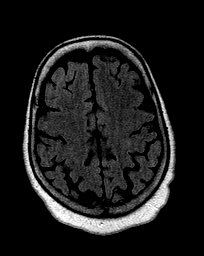
[im 50/50]
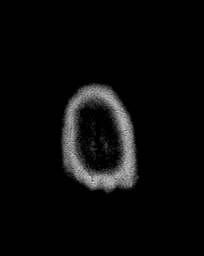

[Series 11: T2 · axial · 5.0mm · 0.45mm/px · z∈[-107,+45]mm · 2 of 23 slices shown (2 of 3)]
[im 1/23]
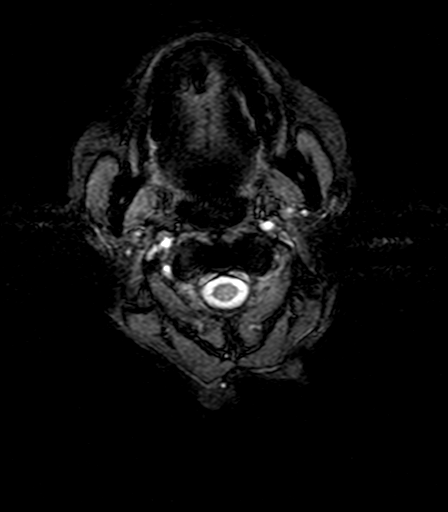
[im 23/23]
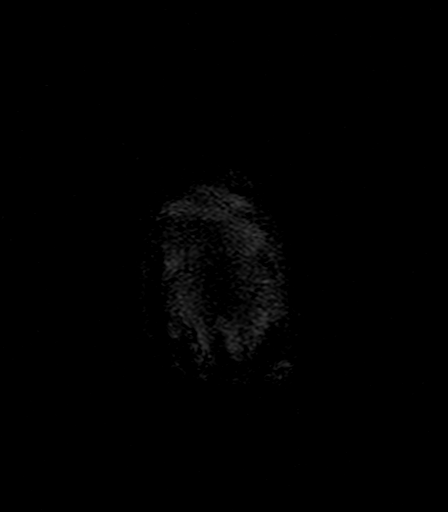

[Series 12: T1 · axial · 1.0mm · 0.45mm/px · z∈[-101,+47]mm · 8 of 160 slices shown (2 of 2)]
[im 11/160]
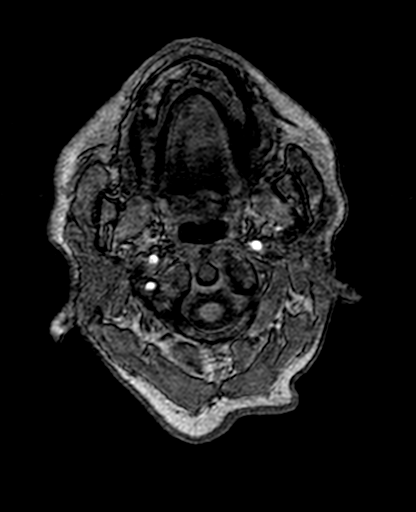
[im 32/160]
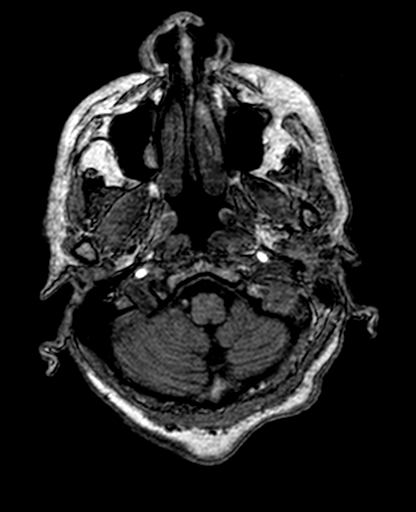
[im 54/160]
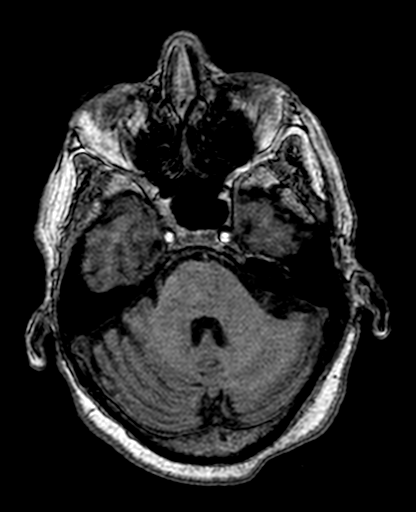
[im 75/160]
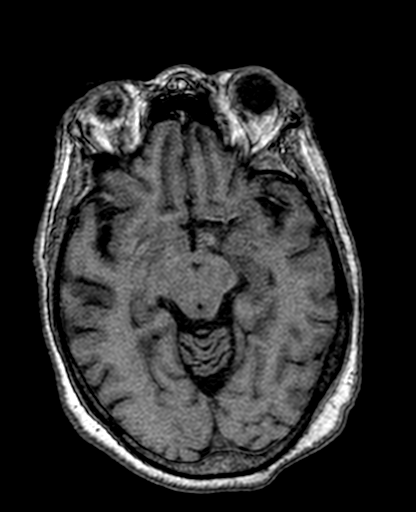
[im 96/160]
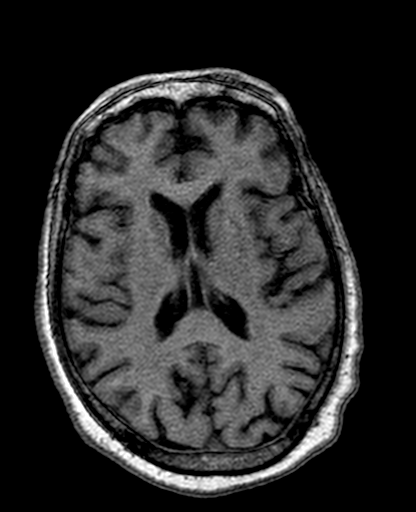
[im 117/160]
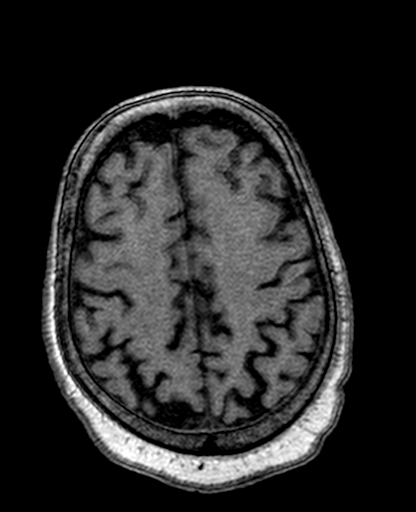
[im 138/160]
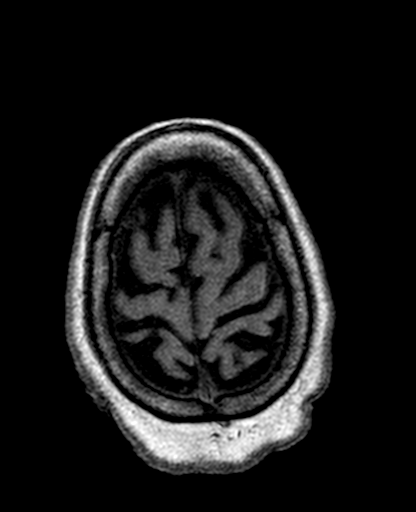
[im 160/160]
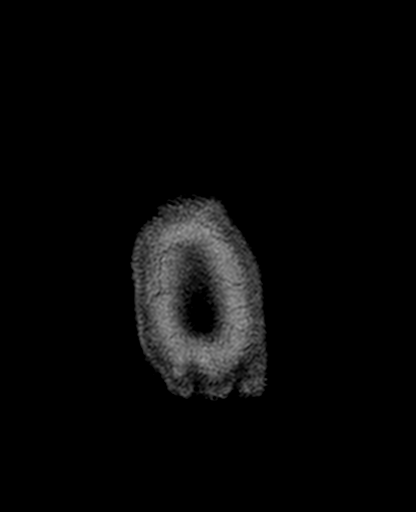

[Series 13: T2 · coronal · 5.0mm · 0.45mm/px · 3 of 27 slices shown (3 of 3)]
[im 1/27]
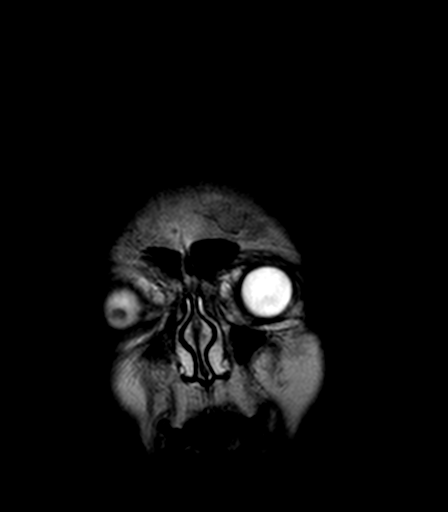
[im 14/27]
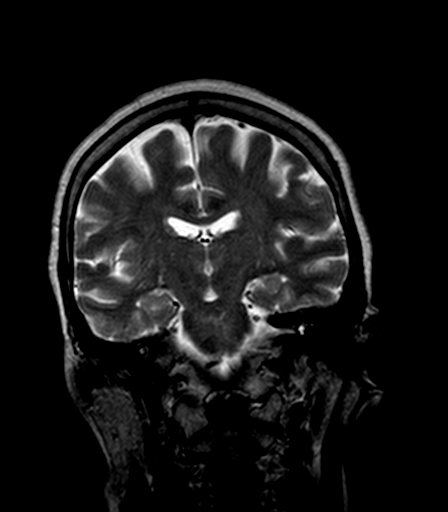
[im 27/27]
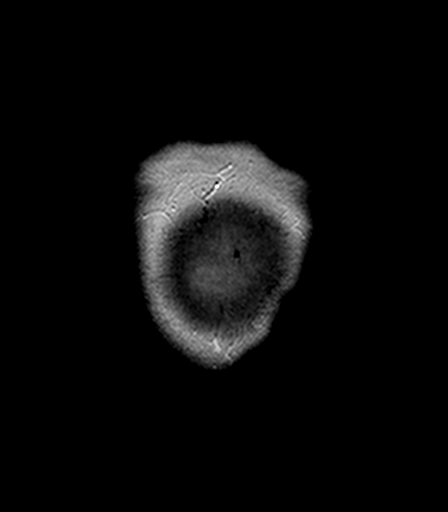

[40 of 48 positions shown; findings below may reference images not displayed]

FINDINGS: Brain: Diffusion imaging does not show any acute or subacute
infarction. There chronic small-vessel ischemic changes of the pons.
Minimal small vessel change of the hemispheric white matter. No
advanced generalized atrophy. No cortical or large vessel territory
infarction. No mass lesion, hemorrhage, hydrocephalus or extra-axial
collection.

Vascular: Major vessels at the base of the brain show flow.

Skull and upper cervical spine: Negative

Sinuses/Orbits: Clear/normal

Other: None
IMPRESSION: No acute or reversible finding. Mild age related volume loss. Mild
to moderate chronic small-vessel changes of the pons and minimal
small vessel change of the hemispheric white matter.

## 2018-12-14 ENCOUNTER — Other Ambulatory Visit: Payer: Self-pay | Admitting: Internal Medicine

## 2018-12-14 NOTE — Telephone Encounter (Signed)
Last filled 11-12-18 #30 Last OV 09-05-18 Next OV 03-06-19 [facility]

## 2018-12-17 ENCOUNTER — Encounter: Payer: Self-pay | Admitting: Emergency Medicine

## 2018-12-17 ENCOUNTER — Emergency Department
Admission: EM | Admit: 2018-12-17 | Discharge: 2018-12-17 | Disposition: A | Discharge status: Home / Self Care | Payer: Medicare Other | Attending: Emergency Medicine | Admitting: Emergency Medicine

## 2018-12-17 ENCOUNTER — Other Ambulatory Visit: Payer: Self-pay

## 2018-12-17 ENCOUNTER — Emergency Department: Payer: Medicare Other

## 2018-12-17 DIAGNOSIS — I5032 Chronic diastolic (congestive) heart failure: Secondary | ICD-10-CM | POA: Diagnosis not present

## 2018-12-17 DIAGNOSIS — R05 Cough: Secondary | ICD-10-CM | POA: Diagnosis not present

## 2018-12-17 DIAGNOSIS — E119 Type 2 diabetes mellitus without complications: Secondary | ICD-10-CM | POA: Insufficient documentation

## 2018-12-17 DIAGNOSIS — Z720 Tobacco use: Secondary | ICD-10-CM

## 2018-12-17 DIAGNOSIS — F17210 Nicotine dependence, cigarettes, uncomplicated: Secondary | ICD-10-CM | POA: Diagnosis not present

## 2018-12-17 DIAGNOSIS — I110 Hypertensive heart disease with heart failure: Secondary | ICD-10-CM | POA: Insufficient documentation

## 2018-12-17 DIAGNOSIS — R110 Nausea: Secondary | ICD-10-CM | POA: Insufficient documentation

## 2018-12-17 DIAGNOSIS — J441 Chronic obstructive pulmonary disease with (acute) exacerbation: Secondary | ICD-10-CM

## 2018-12-17 DIAGNOSIS — R0602 Shortness of breath: Secondary | ICD-10-CM | POA: Diagnosis not present

## 2018-12-17 DIAGNOSIS — I10 Essential (primary) hypertension: Secondary | ICD-10-CM | POA: Diagnosis not present

## 2018-12-17 LAB — BASIC METABOLIC PANEL
Anion gap: 12 (ref 5–15)
BUN: 17 mg/dL (ref 8–23)
CALCIUM: 9.4 mg/dL (ref 8.9–10.3)
CO2: 22 mmol/L (ref 22–32)
Chloride: 105 mmol/L (ref 98–111)
Creatinine, Ser: 1.04 mg/dL — ABNORMAL HIGH (ref 0.44–1.00)
GFR calc Af Amer: >60 mL/min (ref >60)
GFR calc non Af Amer: 52 mL/min — ABNORMAL LOW (ref >60)
Glucose, Bld: 141 mg/dL — ABNORMAL HIGH (ref 70–99)
Potassium: 3.8 mmol/L (ref 3.5–5.1)
Sodium: 139 mmol/L (ref 135–145)

## 2018-12-17 LAB — CBC
HCT: 34.7 % — ABNORMAL LOW (ref 36.0–46.0)
Hemoglobin: 10.7 g/dL — ABNORMAL LOW (ref 12.0–15.0)
MCH: 27 pg (ref 26.0–34.0)
MCHC: 30.8 g/dL (ref 30.0–36.0)
MCV: 87.4 fL (ref 80.0–100.0)
Platelets: 186 10*3/uL (ref 150–400)
RBC: 3.97 MIL/uL (ref 3.87–5.11)
RDW: 13.2 % (ref 11.5–15.5)
WBC: 6.3 10*3/uL (ref 4.0–10.5)
nRBC: 0 % (ref 0.0–0.2)

## 2018-12-17 LAB — INFLUENZA PANEL BY PCR (TYPE A & B)
Influenza A By PCR: NEGATIVE
Influenza B By PCR: NEGATIVE

## 2018-12-17 LAB — TROPONIN I
Troponin I: <0.03 ng/mL (ref <0.03)
Troponin I: <0.03 ng/mL (ref <0.03)

## 2018-12-17 MED ORDER — ONDANSETRON 4 MG PO TBDP: 4.0000 mg | ORAL_TABLET | Freq: Three times a day (TID) | ORAL | 0 refills | Status: DC | PRN

## 2018-12-17 MED ORDER — ONDANSETRON HCL 4 MG/2ML IJ SOLN
4.0000 mg | Freq: Once | INTRAMUSCULAR | Status: AC
  Administered 2018-12-17: 4 mg via INTRAVENOUS
  Filled 2018-12-17: qty 2

## 2018-12-17 MED ORDER — IPRATROPIUM-ALBUTEROL 0.5-2.5 (3) MG/3ML IN SOLN
3.0000 mL | Freq: Once | RESPIRATORY_TRACT | Status: AC
  Administered 2018-12-17: 3 mL via RESPIRATORY_TRACT
  Filled 2018-12-17: qty 3

## 2018-12-17 MED ORDER — ALBUTEROL SULFATE HFA 108 (90 BASE) MCG/ACT IN AERS: 2.0000 | INHALATION_SPRAY | Freq: Four times a day (QID) | RESPIRATORY_TRACT | 0 refills | Status: DC | PRN

## 2018-12-17 MED ORDER — PREDNISONE 20 MG PO TABS: 60.0000 mg | ORAL_TABLET | Freq: Every day | ORAL | 0 refills | Status: DC

## 2018-12-17 MED ORDER — PREDNISONE 20 MG PO TABS
60.0000 mg | ORAL_TABLET | Freq: Once | ORAL | Status: AC
  Administered 2018-12-17: 60 mg via ORAL
  Filled 2018-12-17: qty 3

## 2018-12-17 NOTE — ED Provider Notes (Addendum)
[facility] Emergency Department Provider Note  ____________________________________________  Time seen: Approximately 7:46 AM  I have reviewed the triage vital signs and the nursing notes.   HISTORY  Chief Complaint Cough    HPI Kathryn Ware is a 78 y.o. female history of HTN, HL, DM, ongoing tobacco abuse, presenting with 1 month of general malaise, worse over the past 2 weeks.  The patient reports that for the past month, she has been having a productive cough with exertional dyspnea.  No lower extremity swelling or chest pain.  Over the past 2 weeks, her symptoms have worsened and she has developed nausea.  She has had clear rhinorrhea without sore throat or ear pain.  She felt that she might have the flu although she reports she did get her influenza vaccination.  This morning, she was feeling "worse" so called the paramedics.  The patient denies any lightheadedness or syncope, palpitations, fevers or chills.  Past Medical History:  Diagnosis Date  . Anemia   . ARF (acute renal failure) ([place])   . Blood transfusion without reported diagnosis   . Diabetes mellitus type II   . GI (gastrointestinal bleed)   . GI AVM (gastrointestinal arteriovenous vascular malformation)    stomach  . History of echocardiogram 12/2013   a. 12/2013: EF 60-65%, DD, mild LVH, nl RV size & systolic function, mild MR/TR, mild AS, nl RVSP; b. TTE 04/2017: EF 60-65%, normal wall motion, GR1DD, mild MR, left atrium normal in size, normal RV systolic function, PASP normal   . History of nuclear stress test    a. 12/2013: low risk, no sig WMA, nondiag EKG 2/2 baseline LVH w/ repol abnl, no sig ischemia, EF 70% no artifact  . HLD (hyperlipidemia)   . HTN (hypertension)   . Hypercholesterolemia   . IDA (iron deficiency anemia)   . Polyp of colon, adenomatous   . RLS (restless legs syndrome)     Patient Active Problem List   Diagnosis Date Noted  . GIB (gastrointestinal bleeding)  06/07/2018  . Aortic atherosclerosis ([place]) 12/19/2017  . GI bleed 07/18/2017  . Chronic diastolic CHF (congestive heart failure) ([place]) 05/31/2017  . Anemia 07/05/2016  . Restless legs syndrome (RLS) 02/23/2016  . 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 Anemia due to gastrointestinal blood loss 02/18/2015  . Advance directive discussed with patient 02/18/2015  . Nicotine dependence 10/29/2013  . Routine general medical examination at a health care facility 08/17/2011  . Mild cognitive impairment 08/17/2011  . Diabetes mellitus with complication ([place]) 16/96/7893  . HYPERCHOLESTEROLEMIA 02/02/2007  . Essential hypertension 02/02/2007  . Angiodysplasia of intestinal tract 02/02/2007    Past Surgical History:  Procedure Laterality Date  . ABDOMINAL ADHESION SURGERY  11/2001   Dr. Tamala Julian  . ABDOMINAL HYSTERECTOMY  1976   RSO (fibroid)  . COLONOSCOPY WITH PROPOFOL N/A 07/07/2016   Procedure: COLONOSCOPY WITH PROPOFOL;  Surgeon: Manya Silvas, MD;  Location: [facility] ENDOSCOPY;  Service: Endoscopy;  Laterality: N/A;  . COLONOSCOPY WITH PROPOFOL N/A 12/14/2016   Procedure: COLONOSCOPY WITH PROPOFOL;  Surgeon: Manya Silvas, MD;  Location: [facility] ENDOSCOPY;  Service: Endoscopy;  Laterality: N/A;  . DOBUTAMINE STRESS ECHO  11/10   normal  . DOPPLER ECHOCARDIOGRAPHY     EF 55%, mild MR, TR  . ENTEROSCOPY N/A 06/08/2018   Procedure: ENTEROSCOPY;  Surgeon: Lin Landsman, MD;  Location: [facility] ENDOSCOPY;  Service: Gastroenterology;  Laterality: N/A;  . ESOPHAGOGASTRODUODENOSCOPY  07/2006   multiple angioectasias  .  ESOPHAGOGASTRODUODENOSCOPY Left 03/12/2015   Procedure: place tube in throat and evaluate stomach and duodenum for source of bleeding. Cauterize if concern for rebleeding.;  Surgeon: Hulen Luster, MD;  Location: [facility] ENDOSCOPY;  Service: Endoscopy;  Laterality: Left;  . ESOPHAGOGASTRODUODENOSCOPY N/A 12/14/2016   Procedure: ESOPHAGOGASTRODUODENOSCOPY (EGD);  Surgeon: Manya Silvas, MD;  Location: [facility] ENDOSCOPY;  Service: Endoscopy;  Laterality: N/A;  . ESOPHAGOGASTRODUODENOSCOPY N/A 07/19/2017   Procedure: ESOPHAGOGASTRODUODENOSCOPY (EGD);  Surgeon: Lin Landsman, MD;  Location: [facility] ENDOSCOPY;  Service: Gastroenterology;  Laterality: N/A;  . ESOPHAGOGASTRODUODENOSCOPY (EGD) WITH PROPOFOL N/A 11/23/2015   Procedure: ESOPHAGOGASTRODUODENOSCOPY (EGD) WITH PROPOFOL;  Surgeon: Hulen Luster, MD;  Location: [facility] ENDOSCOPY;  Service: Gastroenterology;  Laterality: N/A;  . ESOPHAGOGASTRODUODENOSCOPY (EGD) WITH PROPOFOL N/A 07/07/2016   Procedure: ESOPHAGOGASTRODUODENOSCOPY (EGD) WITH PROPOFOL;  Surgeon: Manya Silvas, MD;  Location: [facility] ENDOSCOPY;  Service: Endoscopy;  Laterality: N/A;  . ESOPHAGOGASTRODUODENOSCOPY (EGD) WITH PROPOFOL N/A 05/19/2017   Procedure: ESOPHAGOGASTRODUODENOSCOPY (EGD) WITH PROPOFOL;  Surgeon: Manya Silvas, MD;  Location: [facility] ENDOSCOPY;  Service: Endoscopy;  Laterality: N/A;  . laryngeal polyp  10/2008   Dr. Tami Ribas  . TOP      Current Outpatient Rx  . Order #: [id number] Class: Normal  . Order #: [id number] Class: Print  . Order #: [id number] Class: Normal  . Order #: [id number] Class: Normal  . Order #: [id number] Class: Normal  . Order #: [id number] Class: Normal  . Order #: [id number] Class: Normal  . Order #: [id number] Class: Historical Med  . Order #: [id number] Class: No Print  . Order #: [id number] Class: Print  . Order #: [id number] Class: Print  . Order #: [id number] Class: Normal  . Order #: [id number] Class: Print    Allergies Nifedipine and Metformin  Family History  Problem Relation Age of Onset  . Stroke Father   . Cancer Sister        breast cancer  . Hypertension Mother   . Hypertension Other        sibling  . Diabetes Other        grandmother    Social History Social History   Tobacco Use  . Smoking status: Current Every Day Smoker    Packs/day: 1.00    Types: Cigarettes  . Smokeless tobacco: Never Used   Substance Use Topics  . Alcohol use: No  . Drug use: No    Review of Systems Constitutional: No fever/chills.  No lightheadedness or syncope.  No diaphoresis.  Positive general malaise. Eyes: No visual changes.  No eye discharge. ENT: No sore throat.  Does have congestion with clear rhinorrhea.  No ear pain. Cardiovascular: Denies chest pain. Denies palpitations. Respiratory: Positive shortness of breath.  Positive productive cough. Gastrointestinal: No abdominal pain.  Positive nausea, no vomiting.  No diarrhea.  No constipation. Genitourinary: Negative for dysuria. Musculoskeletal: Negative for back pain.  No lower extremity swelling or calf pain Skin: Negative for rash. Neurological: Negative for headaches. No focal numbness, tingling or weakness.     ____________________________________________   PHYSICAL EXAM:  VITAL SIGNS: ED Triage Vitals [12/17/18 0736]  Enc Vitals Group     BP      Pulse      Resp      Temp      Temp src      SpO2      Weight 119 lb 14.9 oz (54.4 kg)     Height      Head Circumference      Peak Flow      Pain Score  6     Pain Loc      Pain Edu?      Excl. in [place]?     Constitutional: Alert and oriented. Answers questions appropriately. Eyes: Conjunctivae are normal.  EOMI. No scleral icterus. Head: Atraumatic. Nose: No congestion/rhinnorhea. Mouth/Throat: Mucous membranes are moist.  Neck: No stridor.  Supple.  Mild JVD.  No meningismus. Cardiovascular: Normal rate, regular rhythm. No murmurs, rubs or gallops.  Respiratory: No tachypnea, accessory muscle use or retractions.  The patient does have a prolonged expiratory phase.  She has mild rales in the bases bilaterally and end expiratory wheezing. Gastrointestinal: Soft, nontender and nondistended.  No guarding or rebound.  No peritoneal signs. Musculoskeletal: No LE edema. No ttp in the calves or palpable cords.  Negative Homan's sign. Neurologic:  A&Ox3.  Speech is clear.  Face and  smile are symmetric.  EOMI.  Moves all extremities well. Skin:  Skin is warm, dry and intact. No rash noted. Psychiatric: Mood and affect are normal. Speech and behavior are normal.  Normal judgement.  ____________________________________________   LABS (all labs ordered are listed, but only abnormal results are displayed)  Labs Reviewed  CBC - Abnormal; Notable for the following components:      Result Value   Hemoglobin 10.7 (*)    HCT 34.7 (*)    All other components within normal limits  BASIC METABOLIC PANEL - Abnormal; Notable for the following components:   Glucose, Bld 141 (*)    Creatinine, Ser 1.04 (*)    GFR calc non Af Amer 52 (*)    All other components within normal limits  INFLUENZA PANEL BY PCR (TYPE A & B)  TROPONIN I   ____________________________________________  EKG  ED ECG REPORT I, Anne-Caroline Mariea Clonts, the attending physician, personally viewed and interpreted this ECG.   Date: 12/17/2018  EKG Time: 744  Rate: 99  Rhythm: normal sinus rhythm  Axis: normal  Intervals:none  ST&T Change: No STEMI; T wave inversions in V3 through V6.  Poor baseline tracing.  EKG is compared to 8/19, which shows T wave inversions in the lateral leads, although not in V3.  This may be attributable to lead placement.  ED ECG REPORT I, Anne-Caroline Mariea Clonts, the attending physician, personally viewed and interpreted this ECG.   Date: 12/17/2018  EKG Time: 1015  Rate: 94  Rhythm: normal sinus rhythm  Axis: normal  Intervals:none  ST&T Change: No STEMI.  The patient does have 0.5 mm of ST elevation in V1 and T wave inversions with 0.5 to 1.5 mm of ST depression in V3 through V6.  ____________________________________________  RADIOLOGY  Dg Chest 2 View  Result Date: 12/17/2018 CLINICAL DATA:  Cough and congestion EXAM: CHEST - 2 VIEW COMPARISON:  June 07, 2018. FINDINGS: There are small granulomas in the apices. There is no appreciable edema or consolidation.  Heart size and pulmonary vascularity are normal. No adenopathy. There is aortic atherosclerosis. No evident bone lesions. IMPRESSION: No edema or consolidation. Small granulomas in the apices. Stable cardiac silhouette. Aortic Atherosclerosis (ICD10-I70.0). Electronically Signed   By: Lowella Grip III M.D.   On: 12/17/2018 08:11    ____________________________________________   PROCEDURES  Procedure(s) performed: None  Procedures  Critical Care performed: No ____________________________________________   INITIAL IMPRESSION / ASSESSMENT AND PLAN / ED COURSE  Pertinent labs & imaging results that were available during my care of the patient were reviewed by me and considered in my medical decision making (see chart for details).  78 y.o. female with a history of ongoing tobacco abuse, HTN, HL, DM, presenting with 1 month of cough and malaise, worse over the last 2 weeks now with nausea without vomiting.  Overall, the patient is hemodynamically stable.  She does have some pulmonary abnormalities, which may be chronic or acute.  I will treat her with a DuoNeb and steroids, with concern for COPD exacerbation.  We will get a chest x-ray to evaluate for acute infection; given no history of fever and such a progressive course of symptoms, this may be a COPD exacerbation, COPD exacerbation from viral URI, and we will also exclude influenza.  At this time, the patient is not in any extremitas.  Plan reevaluation for final disposition.  ----------------------------------------- 9:31 AM on 12/17/2018 -----------------------------------------  Patient's work-up in the emergency department has been reassuring.  She has no evidence of an acute infection; she is afebrile, her O2 sats are stable, her influenza testing is negative, there is no infiltrate on her chest x-ray and her white blood cell count is normal at 6.3.  It is possible that she has an acute COPD exacerbation with ongoing tobacco abuse.   She has been treated with a DuoNeb and prednisone here, and will go home with a 5-day prednisone burst and a prescription for an albuterol MDI.  She states that she is not on any chronic medications for COPD; I have given her instructions to follow-up with her PMD for reevaluation.  I do not see any evidence of ACS or MI, CHF today.  At this time, the patient is feeling better and is stable for discharge home.  Follow-up instructions as well as return precautions were discussed.  ----------------------------------------- 10:18 AM on 12/17/2018 -----------------------------------------  I had asked the nurse to repeat the patient's EKG prior to discharge due to the poor baseline tracing.  This EKG does have an improved baseline tracing and does have some T wave inversions with ST depressions without reciprocal changes.  We will get a repeat troponin I will talk to cardiology about the patient's final disposition.  ----------------------------------------- 11:16 AM on 12/17/2018 -----------------------------------------  Have spoken with Dr. Fletcher Anon, and had him review the patient's EKG's, with from today and her prior.  He does agree that she has some ST depression, which is likely due to her LVH and has been present on prior EKGs.  Given her reassuring clinical course and clinical findings today, the patient is safe for discharge home.  ____________________________________________  FINAL CLINICAL IMPRESSION(S) / ED DIAGNOSES  Final diagnoses:  COPD exacerbation (HCC)  Nausea without vomiting  Tobacco abuse         NEW MEDICATIONS STARTED DURING THIS VISIT:  New Prescriptions   ONDANSETRON (ZOFRAN ODT) 4 MG DISINTEGRATING TABLET    Take 1 tablet (4 mg total) by mouth every 8 (eight) hours as needed.   PREDNISONE (DELTASONE) 20 MG TABLET    Take 3 tablets (60 mg total) by mouth daily.      Eula Listen, MD 12/17/18 4431    Eula Listen, MD 12/17/18 5400     Eula Listen, MD 12/17/18 1116

## 2018-12-17 NOTE — Discharge Instructions (Signed)
Please make all attempts to quit smoking, or at least to decrease smoking.  This will help with your cough and shortness of breath.  Please take the entire course of steroids, and continue to take all of your medications as prescribed.  Make a follow-up appoint with your primary care physician; Dr. Silvio Pate will decide whether you need to be on chronic medications for your COPD.  Return to the emergency department if you develop severe pain, shortness of breath, fever, lightheadedness or fainting, or any other symptoms concerning to you.

## 2018-12-17 NOTE — ED Notes (Signed)
Pt ambulated approx 100 feet with O2 sat remaining 94-96% on RA

## 2018-12-17 NOTE — ED Triage Notes (Signed)
[facility].  Patient c/o cough and congestion x 1 month.  States had a fever last night of "108".  Patient is AAOx3.  skin warm and dry.  Also c/o some nausea this am.  Also c/o body aches and chills.  170/72  P:  72  R; 16.  SPO2 97%

## 2018-12-17 NOTE — ED Notes (Signed)
Repeat EKG obtained per doctors request. Due to change in EKG, repeat troponin ordered. Discharge dependant on result.

## 2018-12-20 ENCOUNTER — Ambulatory Visit (INDEPENDENT_AMBULATORY_CARE_PROVIDER_SITE_OTHER): Payer: Medicare Other | Admitting: Internal Medicine

## 2018-12-20 ENCOUNTER — Encounter: Payer: Self-pay | Admitting: Internal Medicine

## 2018-12-20 VITALS — BP 116/54 | HR 121 | Temp 98.2°F | Resp 24 | Ht 63.25 in | Wt 116.8 lb

## 2018-12-20 DIAGNOSIS — J42 Unspecified chronic bronchitis: Secondary | ICD-10-CM

## 2018-12-20 DIAGNOSIS — J209 Acute bronchitis, unspecified: Secondary | ICD-10-CM

## 2018-12-20 MED ORDER — ALBUTEROL SULFATE HFA 108 (90 BASE) MCG/ACT IN AERS: 2.0000 | INHALATION_SPRAY | Freq: Four times a day (QID) | RESPIRATORY_TRACT | 1 refills | Status: DC | PRN

## 2018-12-20 MED ORDER — DOXYCYCLINE HYCLATE 100 MG PO TABS: 100.0000 mg | ORAL_TABLET | Freq: Two times a day (BID) | ORAL | 0 refills | Status: DC

## 2018-12-20 NOTE — Progress Notes (Signed)
Subjective:    Patient ID: Kathryn Ware, female    DOB: Mar 29, 1941, 78 y.o.   MRN: [id number]  HPI Here for ER follow up With sister--who stays with her  Started getting sick about 2 months ago Coughing for extended time--- productive This is the worst she has ever had May have had fever Was having some SOB To ER  Got duoneb Rx and steroids CXR did not show infiltrate Sent home with albuterol  Got prednisone---didn't finish due to nausea Still coughing--still productive Lots of rhinorrhea  Current Outpatient Medications on File Prior to Visit  Medication Sig Dispense Refill  . acetaminophen (TYLENOL) 325 MG tablet Take 2 tablets (650 mg total) by mouth every 6 (six) hours as needed for mild pain (or Fever >/= 101).    Marland Kitchen albuterol (PROVENTIL HFA;VENTOLIN HFA) 108 (90 Base) MCG/ACT inhaler Inhale 2 puffs into the lungs every 6 (six) hours as needed for wheezing or shortness of breath. 1 Inhaler 0  . clonazePAM (KLONOPIN) 0.5 MG tablet TAKE 1 TABLET(0.5 MG) BY MOUTH DAILY AS NEEDED FOR ANXIETY 30 tablet 0  . feeding supplement, ENSURE ENLIVE, (ENSURE ENLIVE) LIQD Take 237 mLs by mouth 2 (two) times daily between meals. 60 Bottle 1  . ferrous sulfate 325 (65 FE) MG tablet Take 1 tablet (325 mg total) by mouth 2 (two) times daily. 60 tablet 3  . lisinopril-hydrochlorothiazide (PRINZIDE,ZESTORETIC) 20-12.5 MG tablet TAKE 2 TABLETS BY MOUTH DAILY 60 tablet 11  . metFORMIN (GLUCOPHAGE-XR) 500 MG 24 hr tablet Take 1 tablet (500 mg total) by mouth daily with breakfast. 30 tablet 11  . omeprazole (PRILOSEC) 40 MG capsule TAKE 1 CAPSULE(40 MG) BY MOUTH TWICE DAILY BEFORE A MEAL 180 capsule 3  . ondansetron (ZOFRAN ODT) 4 MG disintegrating tablet Take 1 tablet (4 mg total) by mouth every 8 (eight) hours as needed. 6 tablet 0  . ONE TOUCH ULTRA TEST test strip TEST ONCE DAILY AS DIRECTED 100 each 3  . predniSONE (DELTASONE) 20 MG tablet Take 3 tablets (60 mg total) by mouth daily. 15 tablet 0    . simvastatin (ZOCOR) 80 MG tablet TAKE 1 TABLET(80 MG) BY MOUTH DAILY 90 tablet 3  . sucralfate (CARAFATE) 1 g tablet TAKE 1 TABLET BY MOUTH BEFORE MEALS AND EVERY NIGHT AT BEDTIME 120 tablet 0  . vitamin B-12 (CYANOCOBALAMIN) 1000 MCG tablet Take 1,000 mcg by mouth daily.     No current facility-administered medications on file prior to visit.     Allergies  Allergen Reactions  . Nifedipine     REACTION: cough  . Metformin Other (See Comments)    REACTION: diarrhea when dosage is increased, ok when taking one tablet daily, patient aware that she is on metformin when it is listed that she is allergic    Past Medical History:  Diagnosis Date  . Anemia   . ARF (acute renal failure) ([place])   . Blood transfusion without reported diagnosis   . Diabetes mellitus type II   . GI (gastrointestinal bleed)   . GI AVM (gastrointestinal arteriovenous vascular malformation)    stomach  . History of echocardiogram 12/2013   a. 12/2013: EF 60-65%, DD, mild LVH, nl RV size & systolic function, mild MR/TR, mild AS, nl RVSP; b. TTE 04/2017: EF 60-65%, normal wall motion, GR1DD, mild MR, left atrium normal in size, normal RV systolic function, PASP normal   . History of nuclear stress test    a. 12/2013: low risk, no sig WMA,  nondiag EKG 2/2 baseline LVH w/ repol abnl, no sig ischemia, EF 70% no artifact  . HLD (hyperlipidemia)   . HTN (hypertension)   . Hypercholesterolemia   . IDA (iron deficiency anemia)   . Polyp of colon, adenomatous   . RLS (restless legs syndrome)     Past Surgical History:  Procedure Laterality Date  . ABDOMINAL ADHESION SURGERY  11/2001   Dr. Tamala Julian  . ABDOMINAL HYSTERECTOMY  1976   RSO (fibroid)  . COLONOSCOPY WITH PROPOFOL N/A 07/07/2016   Procedure: COLONOSCOPY WITH PROPOFOL;  Surgeon: Manya Silvas, MD;  Location: [facility] ENDOSCOPY;  Service: Endoscopy;  Laterality: N/A;  . COLONOSCOPY WITH PROPOFOL N/A 12/14/2016   Procedure: COLONOSCOPY WITH PROPOFOL;  Surgeon:  Manya Silvas, MD;  Location: [facility] ENDOSCOPY;  Service: Endoscopy;  Laterality: N/A;  . DOBUTAMINE STRESS ECHO  11/10   normal  . DOPPLER ECHOCARDIOGRAPHY     EF 55%, mild MR, TR  . ENTEROSCOPY N/A 06/08/2018   Procedure: ENTEROSCOPY;  Surgeon: Lin Landsman, MD;  Location: [facility] ENDOSCOPY;  Service: Gastroenterology;  Laterality: N/A;  . ESOPHAGOGASTRODUODENOSCOPY  07/2006   multiple angioectasias  . ESOPHAGOGASTRODUODENOSCOPY Left 03/12/2015   Procedure: place tube in throat and evaluate stomach and duodenum for source of bleeding. Cauterize if concern for rebleeding.;  Surgeon: Hulen Luster, MD;  Location: [facility] ENDOSCOPY;  Service: Endoscopy;  Laterality: Left;  . ESOPHAGOGASTRODUODENOSCOPY N/A 12/14/2016   Procedure: ESOPHAGOGASTRODUODENOSCOPY (EGD);  Surgeon: Manya Silvas, MD;  Location: [facility] ENDOSCOPY;  Service: Endoscopy;  Laterality: N/A;  . ESOPHAGOGASTRODUODENOSCOPY N/A 07/19/2017   Procedure: ESOPHAGOGASTRODUODENOSCOPY (EGD);  Surgeon: Lin Landsman, MD;  Location: [facility] ENDOSCOPY;  Service: Gastroenterology;  Laterality: N/A;  . ESOPHAGOGASTRODUODENOSCOPY (EGD) WITH PROPOFOL N/A 11/23/2015   Procedure: ESOPHAGOGASTRODUODENOSCOPY (EGD) WITH PROPOFOL;  Surgeon: Hulen Luster, MD;  Location: [facility] ENDOSCOPY;  Service: Gastroenterology;  Laterality: N/A;  . ESOPHAGOGASTRODUODENOSCOPY (EGD) WITH PROPOFOL N/A 07/07/2016   Procedure: ESOPHAGOGASTRODUODENOSCOPY (EGD) WITH PROPOFOL;  Surgeon: Manya Silvas, MD;  Location: [facility] ENDOSCOPY;  Service: Endoscopy;  Laterality: N/A;  . ESOPHAGOGASTRODUODENOSCOPY (EGD) WITH PROPOFOL N/A 05/19/2017   Procedure: ESOPHAGOGASTRODUODENOSCOPY (EGD) WITH PROPOFOL;  Surgeon: Manya Silvas, MD;  Location: [facility] ENDOSCOPY;  Service: Endoscopy;  Laterality: N/A;  . laryngeal polyp  10/2008   Dr. Tami Ribas  . TOP      Family History  Problem Relation Age of Onset  . Stroke Father   . Cancer Sister        breast cancer  . Hypertension Mother   .  Hypertension Other        sibling  . Diabetes Other        grandmother    Social History   Socioeconomic History  . Marital status: Widowed    Spouse name: Not on file  . Number of children: 1  . Years of education: Not on file  . Highest education level: Not on file  Occupational History  . 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 Financial resource strain: Not on file  . Food insecurity:    Worry: Not on file    Inability: Not on file  . Transportation needs:    Medical: Not on file    Non-medical: Not on file  Tobacco Use  . Smoking status: Current Every Day Smoker    Packs/day: 1.00    Types: Cigarettes  . Smokeless tobacco: Never Used  Substance and Sexual Activity  . Alcohol use: No  .  Drug use: No  . Sexual activity: Not on file  Lifestyle  . Physical activity:    Days per week: Not on file    Minutes per session: Not on file  . Stress: Not on file  Relationships  . Social connections:    Talks on phone: Not on file    Gets together: Not on file    Attends religious service: Not on file    Active member of club or organization: Not on file    Attends meetings of clubs or organizations: Not on file    Relationship status: Not on file  . Intimate partner violence:    Fear of current or ex partner: Not on file    Emotionally abused: Not on file    Physically abused: Not on file    Forced sexual activity: Not on file  Other Topics Concern  . Not on file  Social History Narrative   No living will   Requests son Orson [employer] as health care POA   Would accept resuscitation --but no prolonged machines   Not sure about feeding tubes   Review of Systems Not eating well Has lost a few pounds    Objective:   Physical Exam  Constitutional: She appears well-developed. No distress.  HENT:  No sinus tenderness Mild nasal congestion No pharyngeal injection TMs normal  Neck: No thyromegaly present.  Cardiovascular: Normal rate,  regular rhythm and normal heart sounds. Exam reveals no gallop.  No murmur heard. Respiratory: Effort normal. No respiratory distress. She has no wheezes. She has no rales.  Decreased breath sounds Some early rhonchi--cleared with cough  Lymphadenopathy:    She has no cervical adenopathy.           Assessment & Plan:

## 2018-12-20 NOTE — Patient Instructions (Signed)
Remember you promised to cut your cigarette smoking in half--make a carton last at least 2 WEEKS!! Use a spacer (empty toilet paper roll) when using the inhaler--it will allow more medicine to make it into your lungs.

## 2018-12-20 NOTE — Assessment & Plan Note (Signed)
Has underreported her respiratory symptoms for some time Over 2 months this time of productive cough At least some degree in past years also Went to ER fearing pneumonia---fortunately not Didn't tolerate prednisone--but may have helped Discussed proper inhaler technique --using spacer Will add antibiotic Begged her to stop smoking---just once. Negotiated--at least try to cut in half (1 carton for 2 weeks instead of 1)

## 2018-12-30 ENCOUNTER — Other Ambulatory Visit: Payer: Self-pay | Admitting: Internal Medicine

## 2019-02-23 ENCOUNTER — Other Ambulatory Visit: Payer: Self-pay | Admitting: Internal Medicine

## 2019-02-25 NOTE — Telephone Encounter (Signed)
Last filled 12-14-18 #30 Last OV 12-20-18 Next OV 03-06-19 [facility]

## 2019-02-28 ENCOUNTER — Other Ambulatory Visit: Payer: Self-pay | Admitting: Internal Medicine

## 2019-03-06 ENCOUNTER — Ambulatory Visit: Payer: Medicare Other | Admitting: Internal Medicine

## 2019-04-01 ENCOUNTER — Other Ambulatory Visit: Payer: Self-pay | Admitting: Internal Medicine

## 2019-04-01 NOTE — Telephone Encounter (Signed)
Last filled 02-25-19 #30 Last OV 12-20-18 Next OV 04-11-19 [facility]

## 2019-04-11 ENCOUNTER — Ambulatory Visit: Payer: Medicare Other | Admitting: Internal Medicine

## 2019-04-18 ENCOUNTER — Encounter: Payer: Self-pay | Admitting: Internal Medicine

## 2019-04-18 ENCOUNTER — Ambulatory Visit (INDEPENDENT_AMBULATORY_CARE_PROVIDER_SITE_OTHER): Payer: Medicare Other | Admitting: Internal Medicine

## 2019-04-18 DIAGNOSIS — K5520 Angiodysplasia of colon without hemorrhage: Secondary | ICD-10-CM | POA: Diagnosis not present

## 2019-04-18 DIAGNOSIS — E118 Type 2 diabetes mellitus with unspecified complications: Secondary | ICD-10-CM | POA: Diagnosis not present

## 2019-04-18 DIAGNOSIS — I5032 Chronic diastolic (congestive) heart failure: Secondary | ICD-10-CM

## 2019-04-18 DIAGNOSIS — J411 Mucopurulent chronic bronchitis: Secondary | ICD-10-CM

## 2019-04-18 DIAGNOSIS — G3184 Mild cognitive impairment, so stated: Secondary | ICD-10-CM | POA: Diagnosis not present

## 2019-04-18 NOTE — Assessment & Plan Note (Signed)
No clear recurrence

## 2019-04-18 NOTE — Assessment & Plan Note (Signed)
Seems to be compensated No changes needed

## 2019-04-18 NOTE — Assessment & Plan Note (Signed)
Has cut down smoking to 1-2 per day No recent exacerbation

## 2019-04-18 NOTE — Assessment & Plan Note (Signed)
Lab Results  Component Value Date   HGBA1C 6.0 09/05/2018   Control still seems to be excellent Will defer A1c this time to keep her safely out of the office Vascular complications not worsened

## 2019-04-18 NOTE — Progress Notes (Signed)
Subjective:    Patient ID: Kathryn Ware, female    DOB: 06/05/1941, 78 y.o.   MRN: [id number]  HPI Visit for follow up of diabetes and other chronic health conditions  Interactive audio and video telecommunications were attempted between this provider and patient, however failed, due to patient having technical difficulties OR patient did not have access to video capability.  We continued and completed visit with audio only.   Virtual Visit via Telephone Note  I connected with Kathryn Ware on 04/18/19 at  2:00 PM EDT by telephone and verified that I am speaking with the correct person using two identifiers.  Location: Patient: home Provider: office   I discussed the limitations, risks, security and privacy concerns of performing an evaluation and management service by telephone and the availability of in person appointments. I also discussed with the patient that there may be a patient responsible charge related to this service. The patient expressed understanding and agreed to proceed.   History of Present Illness: She is keeping to herself----mostly just stays in the house Doing okay Checks sugars every morning---94 today. Usually under 110 Rarely over 125 No clear low sugar reactions  No clear signs of GI bleeding Stools remain brown Energy is okay  Memory seems stable She still shares housework with sister  No SOB No chest pain No dizziness or syncope  Still smoking "a little bit" Down to 1-2 per day Some cough---sputum fairly regularly--- but no worse Breathing is stable  Current Outpatient Medications on File Prior to Visit  Medication Sig Dispense Refill  . acetaminophen (TYLENOL) 325 MG tablet Take 2 tablets (650 mg total) by mouth every 6 (six) hours as needed for mild pain (or Fever >/= 101).    . clonazePAM (KLONOPIN) 0.5 MG tablet TAKE 1 TABLET(0.5 MG) BY MOUTH DAILY AS NEEDED FOR ANXIETY 30 tablet 0  . feeding supplement, ENSURE ENLIVE, (ENSURE ENLIVE)  LIQD Take 237 mLs by mouth 2 (two) times daily between meals. 60 Bottle 1  . ferrous sulfate 325 (65 FE) MG tablet Take 1 tablet (325 mg total) by mouth 2 (two) times daily. 60 tablet 3  . lisinopril-hydrochlorothiazide (PRINZIDE,ZESTORETIC) 20-12.5 MG tablet TAKE 2 TABLETS BY MOUTH DAILY 60 tablet 11  . metFORMIN (GLUCOPHAGE-XR) 500 MG 24 hr tablet TAKE 1 TABLET(500 MG) BY MOUTH DAILY WITH BREAKFAST 30 tablet 11  . omeprazole (PRILOSEC) 40 MG capsule TAKE 1 CAPSULE(40 MG) BY MOUTH TWICE DAILY BEFORE A MEAL 180 capsule 3  . ONE TOUCH ULTRA TEST test strip TEST ONCE DAILY AS DIRECTED 100 each 3  . PROAIR HFA 108 (90 Base) MCG/ACT inhaler INHALE 2 PUFFS INTO THE LUNGS EVERY 6 HOURS AS NEEDED FOR WHEEZING OR SHORTNESS OF BREATH 8.5 g 0  . simvastatin (ZOCOR) 80 MG tablet TAKE 1 TABLET(80 MG) BY MOUTH DAILY 90 tablet 3  . sucralfate (CARAFATE) 1 g tablet TAKE 1 TABLET BY MOUTH BEFORE MEALS AND EVERY NIGHT AT BEDTIME 120 tablet 0  . vitamin B-12 (CYANOCOBALAMIN) 1000 MCG tablet Take 1,000 mcg by mouth daily.     No current facility-administered medications on file prior to visit.     Allergies  Allergen Reactions  . Nifedipine     REACTION: cough  . Metformin Other (See Comments)    REACTION: diarrhea when dosage is increased, ok when taking one tablet daily, patient aware that she is on metformin when it is listed that she is allergic    Past Medical History:  Diagnosis  Date  . Anemia   . ARF (acute renal failure) ([place])   . Blood transfusion without reported diagnosis   . Diabetes mellitus type II   . GI (gastrointestinal bleed)   . GI AVM (gastrointestinal arteriovenous vascular malformation)    stomach  . History of echocardiogram 12/2013   a. 12/2013: EF 60-65%, DD, mild LVH, nl RV size & systolic function, mild MR/TR, mild AS, nl RVSP; b. TTE 04/2017: EF 60-65%, normal wall motion, GR1DD, mild MR, left atrium normal in size, normal RV systolic function, PASP normal   . History of  nuclear stress test    a. 12/2013: low risk, no sig WMA, nondiag EKG 2/2 baseline LVH w/ repol abnl, no sig ischemia, EF 70% no artifact  . HLD (hyperlipidemia)   . HTN (hypertension)   . Hypercholesterolemia   . IDA (iron deficiency anemia)   . Polyp of colon, adenomatous   . RLS (restless legs syndrome)     Past Surgical History:  Procedure Laterality Date  . ABDOMINAL ADHESION SURGERY  11/2001   Dr. Tamala Julian  . ABDOMINAL HYSTERECTOMY  1976   RSO (fibroid)  . COLONOSCOPY WITH PROPOFOL N/A 07/07/2016   Procedure: COLONOSCOPY WITH PROPOFOL;  Surgeon: Manya Silvas, MD;  Location: [facility] ENDOSCOPY;  Service: Endoscopy;  Laterality: N/A;  . COLONOSCOPY WITH PROPOFOL N/A 12/14/2016   Procedure: COLONOSCOPY WITH PROPOFOL;  Surgeon: Manya Silvas, MD;  Location: [facility] ENDOSCOPY;  Service: Endoscopy;  Laterality: N/A;  . DOBUTAMINE STRESS ECHO  11/10   normal  . DOPPLER ECHOCARDIOGRAPHY     EF 55%, mild MR, TR  . ENTEROSCOPY N/A 06/08/2018   Procedure: ENTEROSCOPY;  Surgeon: Lin Landsman, MD;  Location: [facility] ENDOSCOPY;  Service: Gastroenterology;  Laterality: N/A;  . ESOPHAGOGASTRODUODENOSCOPY  07/2006   multiple angioectasias  . ESOPHAGOGASTRODUODENOSCOPY Left 03/12/2015   Procedure: place tube in throat and evaluate stomach and duodenum for source of bleeding. Cauterize if concern for rebleeding.;  Surgeon: Hulen Luster, MD;  Location: [facility] ENDOSCOPY;  Service: Endoscopy;  Laterality: Left;  . ESOPHAGOGASTRODUODENOSCOPY N/A 12/14/2016   Procedure: ESOPHAGOGASTRODUODENOSCOPY (EGD);  Surgeon: Manya Silvas, MD;  Location[facility]COPY;  Service: Endoscopy;  Laterality: N/A;  . ESOPHAGOGASTRODUODENOSCOPY N/A 07/19/2017   Procedure: ESOPHAGOGASTRODUODENOSCOPY (EGD);  Surgeon: Lin Landsman, MD;  Location: [facility] ENDOSCOPY;  Service: Gastroenterology;  Laterality: N/A;  . ESOPHAGOGASTRODUODENOSCOPY (EGD) WITH PROPOFOL N/A 11/23/2015   Procedure: ESOPHAGOGASTRODUODENOSCOPY (EGD) WITH  PROPOFOL;  Surgeon: Hulen Luster, MD;  Location: [facility] ENDOSCOPY;  Service: Gastroenterology;  Laterality: N/A;  . ESOPHAGOGASTRODUODENOSCOPY (EGD) WITH PROPOFOL N/A 07/07/2016   Procedure: ESOPHAGOGASTRODUODENOSCOPY (EGD) WITH PROPOFOL;  Surgeon: Manya Silvas, MD;  Location: [facility] ENDOSCOPY;  Service: Endoscopy;  Laterality: N/A;  . ESOPHAGOGASTRODUODENOSCOPY (EGD) WITH PROPOFOL N/A 05/19/2017   Procedure: ESOPHAGOGASTRODUODENOSCOPY (EGD) WITH PROPOFOL;  Surgeon: Manya Silvas, MD;  Location: [facility] ENDOSCOPY;  Service: Endoscopy;  Laterality: N/A;  . laryngeal polyp  10/2008   Dr. Tami Ribas  . TOP      Family History  Problem Relation Age of Onset  . Stroke Father   . Cancer Sister        breast cancer  . Hypertension Mother   . Hypertension Other        sibling  . Diabetes Other        grandmother    Social History   Socioeconomic History  . Marital status: Widowed    Spouse name: Not on file  . Number of children: 1  .  Years of education: Not on file  . Highest education level: Not on file  Occupational History  . 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 Financial resource strain: Not on file  . Food insecurity    Worry: Not on file    Inability: Not on file  . Transportation needs    Medical: Not on file    Non-medical: Not on file  Tobacco Use  . Smoking status: Current Every Day Smoker    Packs/day: 1.00    Types: Cigarettes  . Smokeless tobacco: Never Used  Substance and Sexual Activity  . Alcohol use: No  . Drug use: No  . Sexual activity: Not on file  Lifestyle  . Physical activity    Days per week: Not on file    Minutes per session: Not on file  . Stress: Not on file  Relationships  . Social Herbalist on phone: Not on file    Gets together: Not on file    Attends religious service: Not on file    Active member of club or organization: Not on file    Attends meetings of clubs or organizations: Not on file     Relationship status: Not on file  . Intimate partner violence    Fear of current or ex partner: Not on file    Emotionally abused: Not on file    Physically abused: Not on file    Forced sexual activity: Not on file  Other Topics Concern  . Not on file  Social History Narrative   No living will   Requests son Orson Gear as health care POA   Would accept resuscitation --but no prolonged machines   Not sure about feeding tubes    Observations/Objective: Normal conversation No dyspnea  Assessment and Plan: See problem list  Follow Up Instructions:    I discussed the assessment and treatment plan with the patient. The patient was provided an opportunity to ask questions and all were answered. The patient agreed with the plan and demonstrated an understanding of the instructions.   The patient was advised to call back or seek an in-person evaluation if the symptoms worsen or if the condition fails to improve as anticipated.  I provided 11 minutes of non-face-to-face time during this encounter.   Viviana Simpler, MD    Review of Systems     Objective:   Physical Exam         Assessment & Plan:

## 2019-04-18 NOTE — Assessment & Plan Note (Signed)
No change in function or obvious cognitive decline Donepezil didn't seem to help any

## 2019-05-10 ENCOUNTER — Other Ambulatory Visit: Payer: Self-pay | Admitting: Internal Medicine

## 2019-05-10 NOTE — Telephone Encounter (Signed)
Last filled 04-01-19 #30 Last OV 04-18-19 Next OV 09-12-19 [facility]

## 2019-06-16 ENCOUNTER — Other Ambulatory Visit: Payer: Self-pay | Admitting: Internal Medicine

## 2019-06-17 NOTE — Telephone Encounter (Signed)
Last filled 05-10-19 #30 Last OV 04-18-19 Next OV 09-12-19 [facility]

## 2019-07-11 ENCOUNTER — Other Ambulatory Visit: Payer: Self-pay

## 2019-07-11 NOTE — Patient Outreach (Signed)
[facility]) Care Management  07/11/2019  JHORDAN KINTER 1941/03/15 [id number]   Medication Adherence call to Kathryn Ware Hippa Identifiers Verify spoke with patient she is past due on Simvastatin 80 mg she explain she takes 1 tablet daily and has enough for 10 more days,patient ask to [facility]. Mrs. Owes is showing past due under [place].   [place] Management Direct Dial [phone]  Fax [phone] [email or web address]

## 2019-07-19 ENCOUNTER — Other Ambulatory Visit: Payer: Self-pay | Admitting: Internal Medicine

## 2019-07-19 NOTE — Telephone Encounter (Signed)
Last filled 06-17-19 #30 Last OV 04-18-19 Next OV 09-12-19 [facility]

## 2019-08-05 ENCOUNTER — Other Ambulatory Visit: Payer: Self-pay | Admitting: Internal Medicine

## 2019-08-07 ENCOUNTER — Telehealth: Payer: Self-pay

## 2019-08-07 NOTE — Telephone Encounter (Signed)
Pt left v/m that she was notified pts metformin had been recalled and pt request cb with what to do. [facility].Dr Silvio Pate out of office until 08/12/19.Please advise.

## 2019-08-07 NOTE — Telephone Encounter (Signed)
Would offer to switch to immediate release metformin 500mg  1 tablet daily. She would have to watch for diarrhea that may worsen with this switch. Let us know if desires switch.

## 2019-08-08 MED ORDER — METFORMIN HCL 500 MG PO TABS: 500.0000 mg | ORAL_TABLET | Freq: Every day | ORAL | 1 refills | Status: DC

## 2019-08-08 NOTE — Telephone Encounter (Signed)
Spoke with pt relaying Dr. Synthia Innocent message.  Pt verbalizes understanding and agrees to switch metformin.

## 2019-08-08 NOTE — Telephone Encounter (Signed)
Noted  

## 2019-08-08 NOTE — Telephone Encounter (Signed)
Patient notified that Rx has been sent in and verbalized understanding.

## 2019-08-08 NOTE — Telephone Encounter (Addendum)
Ok I have sent in immediate release metformin to take one daily with meal in place of metformin XR.  Watch for GI upset/ diarrhea (given metformin listed on her allergy)

## 2019-08-08 NOTE — Addendum Note (Signed)
Addended by: Ria Bush on: 08/08/2019 02:16 PM   Modules accepted: Orders

## 2019-09-02 ENCOUNTER — Other Ambulatory Visit: Payer: Self-pay

## 2019-09-02 MED ORDER — CLONAZEPAM 0.5 MG PO TABS: ORAL_TABLET | ORAL | 0 refills | Status: DC

## 2019-09-02 NOTE — Telephone Encounter (Signed)
Last filled 07-19-19 #30 Last OV 04-18-19 Next OV 09-12-19 [facility]

## 2019-09-12 ENCOUNTER — Encounter: Payer: Self-pay | Admitting: Internal Medicine

## 2019-09-12 ENCOUNTER — Other Ambulatory Visit: Payer: Self-pay

## 2019-09-12 ENCOUNTER — Ambulatory Visit (INDEPENDENT_AMBULATORY_CARE_PROVIDER_SITE_OTHER): Payer: Medicare Other | Admitting: Internal Medicine

## 2019-09-12 VITALS — BP 138/60 | HR 114 | Temp 97.6°F | Ht 63.25 in | Wt 110.0 lb

## 2019-09-12 DIAGNOSIS — I700 Atherosclerosis of aorta: Secondary | ICD-10-CM

## 2019-09-12 DIAGNOSIS — I5032 Chronic diastolic (congestive) heart failure: Secondary | ICD-10-CM

## 2019-09-12 DIAGNOSIS — I739 Peripheral vascular disease, unspecified: Secondary | ICD-10-CM | POA: Diagnosis not present

## 2019-09-12 DIAGNOSIS — Z7189 Other specified counseling: Secondary | ICD-10-CM

## 2019-09-12 DIAGNOSIS — E441 Mild protein-calorie malnutrition: Secondary | ICD-10-CM

## 2019-09-12 DIAGNOSIS — Z0000 Encounter for general adult medical examination without abnormal findings: Secondary | ICD-10-CM

## 2019-09-12 DIAGNOSIS — E1159 Type 2 diabetes mellitus with other circulatory complications: Secondary | ICD-10-CM | POA: Diagnosis not present

## 2019-09-12 DIAGNOSIS — J410 Simple chronic bronchitis: Secondary | ICD-10-CM

## 2019-09-12 DIAGNOSIS — K5520 Angiodysplasia of colon without hemorrhage: Secondary | ICD-10-CM

## 2019-09-12 DIAGNOSIS — G2581 Restless legs syndrome: Secondary | ICD-10-CM

## 2019-09-12 DIAGNOSIS — G3184 Mild cognitive impairment, so stated: Secondary | ICD-10-CM

## 2019-09-12 LAB — HM DIABETES FOOT EXAM

## 2019-09-12 MED ORDER — ALBUTEROL SULFATE HFA 108 (90 BASE) MCG/ACT IN AERS: INHALATION_SPRAY | RESPIRATORY_TRACT | 2 refills | Status: DC

## 2019-09-12 NOTE — Assessment & Plan Note (Signed)
Seems to have good control Will check labs HTN and PAD

## 2019-09-12 NOTE — Assessment & Plan Note (Signed)
Has lost almost 10% of weight Discussed healthy eating

## 2019-09-12 NOTE — Progress Notes (Signed)
Subjective:    Patient ID: Kathryn Ware, female    DOB: Mar 19, 1941, 78 y.o.   MRN: [id number]  HPI Here for Medicare wellness visit and follow up of chronic health conditions Reviewed advanced directives Reviewed other doctors---Dr Marius Ditch and associates---GI, Dr Rexene Alberts No hospitalizations or surgery this year Still smokes--discussed No alcohol Vision and hearing are okay No falls Gets down feelings at times--talking to sister helps. Not anhedonic Independent with instrumental ADLs--lives with sister Memory problems have not worsened Not really exercising--discussed   Checks sugars every 3 days or so Usually under 115 No low sugar reactions No foot numbness, pain or tingling  No apparent GI bleed Continues on iron  Still smoking Now down to 1/2 PPD Some cough with sputum---~ 1/2 of days No fever or SOB  No chest pain No palpitations No dizziness or syncope Sleeps fair---flat in bed. No PND  Known aortic atherosclerosis Is on statin  Uses the clonazepam nightly for the RLS  Current Outpatient Medications on File Prior to Visit  Medication Sig Dispense Refill  . acetaminophen (TYLENOL) 325 MG tablet Take 2 tablets (650 mg total) by mouth every 6 (six) hours as needed for mild pain (or Fever >/= 101).    . clonazePAM (KLONOPIN) 0.5 MG tablet TAKE 1 TABLET(0.5 MG) BY MOUTH DAILY AS NEEDED FOR ANXIETY 30 tablet 0  . feeding supplement, ENSURE ENLIVE, (ENSURE ENLIVE) LIQD Take 237 mLs by mouth 2 (two) times daily between meals. 60 Bottle 1  . ferrous sulfate 325 (65 FE) MG tablet Take 1 tablet (325 mg total) by mouth 2 (two) times daily. 60 tablet 3  . lisinopril-hydrochlorothiazide (ZESTORETIC) 20-12.5 MG tablet TAKE 2 TABLETS BY MOUTH DAILY 180 tablet 0  . metFORMIN (GLUCOPHAGE-XR) 500 MG 24 hr tablet TAKE 1 TABLET(500 MG) BY MOUTH DAILY WITH BREAKFAST 30 tablet 11  . omeprazole (PRILOSEC) 40 MG capsule TAKE 1 CAPSULE(40 MG) BY MOUTH TWICE DAILY BEFORE A MEAL  180 capsule 3  . ONE TOUCH ULTRA TEST test strip TEST ONCE DAILY AS DIRECTED 100 each 3  . PROAIR HFA 108 (90 Base) MCG/ACT inhaler INHALE 2 PUFFS INTO THE LUNGS EVERY 6 HOURS AS NEEDED FOR WHEEZING OR SHORTNESS OF BREATH 8.5 g 0  . simvastatin (ZOCOR) 80 MG tablet TAKE 1 TABLET(80 MG) BY MOUTH DAILY 90 tablet 3  . sucralfate (CARAFATE) 1 g tablet TAKE 1 TABLET BY MOUTH BEFORE MEALS AND EVERY NIGHT AT BEDTIME 120 tablet 0  . vitamin B-12 (CYANOCOBALAMIN) 1000 MCG tablet Take 1,000 mcg by mouth daily.     No current facility-administered medications on file prior to visit.     Allergies  Allergen Reactions  . Nifedipine     REACTION: cough  . Metformin Other (See Comments)    REACTION: diarrhea when dosage is increased, ok when taking one tablet daily, patient aware that she is on metformin when it is listed that she is allergic    Past Medical History:  Diagnosis Date  . Anemia   . ARF (acute renal failure) ([place])   . Blood transfusion without reported diagnosis   . Diabetes mellitus type II   . GI (gastrointestinal bleed)   . GI AVM (gastrointestinal arteriovenous vascular malformation)    stomach  . History of echocardiogram 12/2013   a. 12/2013: EF 60-65%, DD, mild LVH, nl RV size & systolic function, mild MR/TR, mild AS, nl RVSP; b. TTE 04/2017: EF 60-65%, normal wall motion, GR1DD, mild MR, left atrium normal in size,  normal RV systolic function, PASP normal   . History of nuclear stress test    a. 12/2013: low risk, no sig WMA, nondiag EKG 2/2 baseline LVH w/ repol abnl, no sig ischemia, EF 70% no artifact  . HLD (hyperlipidemia)   . HTN (hypertension)   . Hypercholesterolemia   . IDA (iron deficiency anemia)   . Polyp of colon, adenomatous   . RLS (restless legs syndrome)     Past Surgical History:  Procedure Laterality Date  . ABDOMINAL ADHESION SURGERY  11/2001   Dr. Tamala Julian  . ABDOMINAL HYSTERECTOMY  1976   RSO (fibroid)  . COLONOSCOPY WITH PROPOFOL N/A 07/07/2016    Procedure: COLONOSCOPY WITH PROPOFOL;  Surgeon: Manya Silvas, MD;  Location: [facility] ENDOSCOPY;  Service: Endoscopy;  Laterality: N/A;  . COLONOSCOPY WITH PROPOFOL N/A 12/14/2016   Procedure: COLONOSCOPY WITH PROPOFOL;  Surgeon: Manya Silvas, MD;  Location: [facility] ENDOSCOPY;  Service: Endoscopy;  Laterality: N/A;  . DOBUTAMINE STRESS ECHO  11/10   normal  . DOPPLER ECHOCARDIOGRAPHY     EF 55%, mild MR, TR  . ENTEROSCOPY N/A 06/08/2018   Procedure: ENTEROSCOPY;  Surgeon: Lin Landsman, MD;  Location: [facility] ENDOSCOPY;  Service: Gastroenterology;  Laterality: N/A;  . ESOPHAGOGASTRODUODENOSCOPY  07/2006   multiple angioectasias  . ESOPHAGOGASTRODUODENOSCOPY Left 03/12/2015   Procedure: place tube in throat and evaluate stomach and duodenum for source of bleeding. Cauterize if concern for rebleeding.;  Surgeon: Hulen Luster, MD;  Location: [facility] ENDOSCOPY;  Service: Endoscopy;  Laterality: Left;  . ESOPHAGOGASTRODUODENOSCOPY N/A 12/14/2016   Procedure: ESOPHAGOGASTRODUODENOSCOPY (EGD);  Surgeon: Manya Silvas, MD;  Location[facility] ENDOSCOPY;  Service: Endoscopy;  Laterality: N/A;  . ESOPHAGOGASTRODUODENOSCOPY N/A 07/19/2017   Procedure: ESOPHAGOGASTRODUODENOSCOPY (EGD);  Surgeon: Lin Landsman, MD;  Location: [facility] ENDOSCOPY;  Service: Gastroenterology;  Laterality: N/A;  . ESOPHAGOGASTRODUODENOSCOPY (EGD) WITH PROPOFOL N/A 11/23/2015   Procedure: ESOPHAGOGASTRODUODENOSCOPY (EGD) WITH PROPOFOL;  Surgeon: Hulen Luster, MD;  Location: [facility] ENDOSCOPY;  Service: Gastroenterology;  Laterality: N/A;  . ESOPHAGOGASTRODUODENOSCOPY (EGD) WITH PROPOFOL N/A 07/07/2016   Procedure: ESOPHAGOGASTRODUODENOSCOPY (EGD) WITH PROPOFOL;  Surgeon: Manya Silvas, MD;  Location: [facility] ENDOSCOPY;  Service: Endoscopy;  Laterality: N/A;  . ESOPHAGOGASTRODUODENOSCOPY (EGD) WITH PROPOFOL N/A 05/19/2017   Procedure: ESOPHAGOGASTRODUODENOSCOPY (EGD) WITH PROPOFOL;  Surgeon: Manya Silvas, MD;  Location: [facility] ENDOSCOPY;   Service: Endoscopy;  Laterality: N/A;  . laryngeal polyp  10/2008   Dr. Tami Ribas  . TOP      Family History  Problem Relation Age of Onset  . Stroke Father   . Cancer Sister        breast cancer  . Hypertension Mother   . Hypertension Other        sibling  . Diabetes Other        grandmother    Social History   Socioeconomic History  . Marital status: Widowed    Spouse name: Not on file  . Number of children: 1  . Years of education: Not on file  . Highest education level: Not on file  Occupational History  . 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 Financial resource strain: Not on file  . Food insecurity    Worry: Not on file    Inability: Not on file  . Transportation needs    Medical: Not on file    Non-medical: Not on file  Tobacco Use  . Smoking status: Current Every Day Smoker    Packs/day:  1.00    Types: Cigarettes  . Smokeless tobacco: Never Used  Substance and Sexual Activity  . Alcohol use: No  . Drug use: No  . Sexual activity: Not on file  Lifestyle  . Physical activity    Days per week: Not on file    Minutes per session: Not on file  . Stress: Not on file  Relationships  . Social Herbalist on phone: Not on file    Gets together: Not on file    Attends religious service: Not on file    Active member of club or organization: Not on file    Attends meetings of clubs or organizations: Not on file    Relationship status: Not on file  . Intimate partner violence    Fear of current or ex partner: Not on file    Emotionally abused: Not on file    Physically abused: Not on file    Forced sexual activity: Not on file  Other Topics Concern  . Not on file  Social History Narrative   No living will   Requests son Orson Gear as health care POA   Would accept resuscitation --but no prolonged machines   Not sure about feeding tubes   Review of Systems Appetite is fair Has lost some weight recently--now ~10% down  from former baseline Has dentures Wears seat belt--but hasn't been driving lately Sleeps okay with medication No heartburn or dysphagia Bowels are slow with iron---uses OTC med prn No rash or suspicious skin lesions. Just dry skin No sig back or joint pain    Objective:   Physical Exam  Constitutional: She is oriented to person, place, and time. No distress.  Slight wasting  HENT:  Mouth/Throat: Oropharynx is clear and moist. No oropharyngeal exudate.  Full dentures  Neck: No thyromegaly present.  Cardiovascular: Normal rate and regular rhythm. Exam reveals no gallop.  Gr 3/6 systolic murmur loudest at the base Feet are cool and pulseless  Respiratory: Effort normal. No respiratory distress. She has no wheezes. She has no rales.  Slightly decreased breath sounds  GI: Soft. There is no abdominal tenderness.  Musculoskeletal:        General: No tenderness or edema.  Lymphadenopathy:    She has no cervical adenopathy.  Neurological: She is alert and oriented to person, place, and time.  President--- "Milinda Pointer, Obama, ?" 100-   Not good with numbers l-[id number] Recall 3/3  Went through 12th grade  Normal sensation in feet  Skin:  No foot lesions  Psychiatric: She has a normal mood and affect. Her behavior is normal.           Assessment & Plan:

## 2019-09-12 NOTE — Assessment & Plan Note (Signed)
Cognition and function appear stable

## 2019-09-12 NOTE — Assessment & Plan Note (Signed)
No claudication so will hold off on testing/referral Again discussed the cigarettes

## 2019-09-12 NOTE — Assessment & Plan Note (Signed)
No clear active bleeding Will check hemoglobin

## 2019-09-12 NOTE — Assessment & Plan Note (Signed)
Okay with the clonazepam

## 2019-09-12 NOTE — Assessment & Plan Note (Signed)
I have personally reviewed the Medicare Annual Wellness questionnaire and have noted  1. The patient's medical and social history  2. Their use of alcohol, tobacco or illicit drugs  3. Their current medications and supplements  4. The patient's functional ability including ADL's, fall risks, home safety risks and hearing or visual              impairment.  5. Diet and physical activities  6. Evidence for depression or mood disorders  The patients weight, height, BMI and visual acuity have been recorded in the chart  I have made referrals, counseling and provided education to the patient based review of the above and I have provided the pt with a written personalized care plan for preventive services.   I have provided you with a copy of your personalized plan for preventive services. Please take the time to review along with your updated medication list.  Done with mammograms Fairly recent colon Td with any injury Must stop smoking--but not motivated. Will refer for lung cancer screening though Discussed some exercise

## 2019-09-12 NOTE — Progress Notes (Signed)
Hearing Screening   Method: Audiometry   125Hz  250Hz  500Hz  1000Hz  2000Hz  3000Hz  4000Hz  6000Hz  8000Hz   Right ear:   20 20 20  20     Left ear:   20 20 20  20     Vision Screening Comments: Making an appointment for December 2020

## 2019-09-12 NOTE — Assessment & Plan Note (Signed)
Is on statin No ASA due to recurrent GI bleeds

## 2019-09-12 NOTE — Assessment & Plan Note (Signed)
Still smoking Does okay with the albuterol  Will send for lung cancer screening consideration

## 2019-09-12 NOTE — Assessment & Plan Note (Signed)
See social history 

## 2019-09-12 NOTE — Assessment & Plan Note (Signed)
Compensated. No changes needed. 

## 2019-09-13 LAB — CBC
HCT: 33.6 % — ABNORMAL LOW (ref 36.0–46.0)
Hemoglobin: 10.6 g/dL — ABNORMAL LOW (ref 12.0–15.0)
MCHC: 31.5 g/dL (ref 30.0–36.0)
MCV: 82.4 fl (ref 78.0–100.0)
Platelets: 306 10*3/uL (ref 150.0–400.0)
RBC: 4.08 Mil/uL (ref 3.87–5.11)
RDW: 14.6 % (ref 11.5–15.5)
WBC: 6.2 10*3/uL (ref 4.0–10.5)

## 2019-09-13 LAB — HEMOGLOBIN A1C: Hgb A1c MFr Bld: 5.9 % (ref 4.6–6.5)

## 2019-09-13 LAB — COMPREHENSIVE METABOLIC PANEL
ALT: 6 U/L (ref 0–35)
AST: 15 U/L (ref 0–37)
Albumin: 4.6 g/dL (ref 3.5–5.2)
Alkaline Phosphatase: 41 U/L (ref 39–117)
BUN: 21 mg/dL (ref 6–23)
CO2: 24 mEq/L (ref 19–32)
Calcium: 10.5 mg/dL (ref 8.4–10.5)
Chloride: 106 mEq/L (ref 96–112)
Creatinine, Ser: 1.19 mg/dL (ref 0.40–1.20)
GFR: 53.14 mL/min — ABNORMAL LOW (ref >60.00)
Glucose, Bld: 99 mg/dL (ref 70–99)
Potassium: 4.5 mEq/L (ref 3.5–5.1)
Sodium: 140 mEq/L (ref 135–145)
Total Bilirubin: 0.4 mg/dL (ref 0.2–1.2)
Total Protein: 7.8 g/dL (ref 6.0–8.3)

## 2019-09-13 LAB — LIPID PANEL
Cholesterol: 151 mg/dL (ref 0–200)
HDL: 47.3 mg/dL (ref >39.00)
LDL Cholesterol: 89 mg/dL (ref 0–99)
NonHDL: 103.71
Total CHOL/HDL Ratio: 3
Triglycerides: 74 mg/dL (ref 0.0–149.0)
VLDL: 14.8 mg/dL (ref 0.0–40.0)

## 2019-09-13 LAB — T4, FREE: Free T4: 0.81 ng/dL (ref 0.60–1.60)

## 2019-09-17 ENCOUNTER — Telehealth: Payer: Self-pay | Admitting: *Deleted

## 2019-09-17 NOTE — Telephone Encounter (Signed)
Contacted regarding scheduling lung screening scan. Patient requests to wait until January to consider screening scan.

## 2019-10-04 ENCOUNTER — Other Ambulatory Visit: Payer: Self-pay | Admitting: Internal Medicine

## 2019-10-04 MED ORDER — CLONAZEPAM 0.5 MG PO TABS: ORAL_TABLET | ORAL | 0 refills | Status: DC

## 2019-10-04 NOTE — Telephone Encounter (Signed)
Patient called.  Patient said she called 2 weeks ago for a refill on Clonazepam. Patient said she didn't hear back. Patient has one pill left.  Patient uses [facility].

## 2019-10-04 NOTE — Telephone Encounter (Signed)
Spoke to pt

## 2019-10-04 NOTE — Telephone Encounter (Signed)
We have no requests for clonazepam.   Last filled 09-02-19 #30 Last OV 09-12-19 Next OV 10-05-20 [facility]

## 2019-10-04 NOTE — Telephone Encounter (Signed)
Let her know the refill was done

## 2019-10-23 ENCOUNTER — Telehealth: Payer: Self-pay | Admitting: Internal Medicine

## 2019-10-23 MED ORDER — SIMVASTATIN 80 MG PO TABS: ORAL_TABLET | ORAL | 3 refills | Status: DC

## 2019-10-23 NOTE — Telephone Encounter (Signed)
Rx sent electronically.  

## 2019-10-23 NOTE — Telephone Encounter (Signed)
Patient called. She is requesting a refill on her SIMVASTATIN  Patient said she contacted her pharmacy and they advised her to call our office for the refill  Patient has 4 tablets left   [facility] [place], [place] [address]

## 2019-10-25 DIAGNOSIS — G459 Transient cerebral ischemic attack, unspecified: Secondary | ICD-10-CM

## 2019-10-25 HISTORY — DX: Transient cerebral ischemic attack, unspecified: G45.9

## 2019-11-04 ENCOUNTER — Telehealth: Payer: Self-pay | Admitting: *Deleted

## 2019-11-04 ENCOUNTER — Other Ambulatory Visit: Payer: Self-pay

## 2019-11-04 DIAGNOSIS — Z87891 Personal history of nicotine dependence: Secondary | ICD-10-CM

## 2019-11-04 MED ORDER — LISINOPRIL-HYDROCHLOROTHIAZIDE 20-12.5 MG PO TABS: 2.0000 | ORAL_TABLET | Freq: Every day | ORAL | 3 refills | Status: DC

## 2019-11-04 NOTE — Telephone Encounter (Signed)
Received referral for initial lung cancer screening scan. Contacted patient and obtained smoking history,(current, 31 pack year) as well as answering questions related to screening process. Patient denies signs of lung cancer such as weight loss or hemoptysis. Patient denies comorbidity that would prevent curative treatment if lung cancer were found. Patient is scheduled for shared decision making visit and CT scan on 11/12/19 at 130pm.

## 2019-11-06 ENCOUNTER — Other Ambulatory Visit: Payer: Self-pay

## 2019-11-06 MED ORDER — CLONAZEPAM 0.5 MG PO TABS: ORAL_TABLET | ORAL | 0 refills | Status: DC

## 2019-11-06 NOTE — Telephone Encounter (Signed)
Name of Medication: Clonazepam Name of Pharmacy: [facility] or Written Date and Quantity: 10/04/19, #30 Last Office Visit and Type: 09/12/19, Medicare wellness Next Office Visit and Type: 10/05/20, AWV Last Controlled Substance Agreement Date: none Last UDS:  none

## 2019-11-12 ENCOUNTER — Inpatient Hospital Stay: Payer: Medicare Other | Admitting: Oncology

## 2019-11-12 ENCOUNTER — Ambulatory Visit: Payer: Medicare Other

## 2019-11-26 ENCOUNTER — Other Ambulatory Visit: Payer: Self-pay

## 2019-11-26 ENCOUNTER — Encounter: Payer: Self-pay | Admitting: Internal Medicine

## 2019-11-26 ENCOUNTER — Ambulatory Visit (INDEPENDENT_AMBULATORY_CARE_PROVIDER_SITE_OTHER): Payer: Medicare Other | Admitting: Internal Medicine

## 2019-11-26 DIAGNOSIS — G2581 Restless legs syndrome: Secondary | ICD-10-CM

## 2019-11-26 LAB — CBC
HCT: 30.4 % — ABNORMAL LOW (ref 36.0–46.0)
Hemoglobin: 9.7 g/dL — ABNORMAL LOW (ref 12.0–15.0)
MCHC: 31.7 g/dL (ref 30.0–36.0)
MCV: 81.7 fl (ref 78.0–100.0)
Platelets: 283 10*3/uL (ref 150.0–400.0)
RBC: 3.73 Mil/uL — ABNORMAL LOW (ref 3.87–5.11)
RDW: 16.4 % — ABNORMAL HIGH (ref 11.5–15.5)
WBC: 5.8 10*3/uL (ref 4.0–10.5)

## 2019-11-26 NOTE — Assessment & Plan Note (Signed)
I am concerned that the worsening is due to drop in blood count Will check CBC and send back to hematology for iron infusions if hemoglobin is down  If hemoglobin stable, will change the clonazepam to 1-2 at bedtime (higher dose)

## 2019-11-26 NOTE — Progress Notes (Signed)
Subjective:    Patient ID: Kathryn Ware, female    DOB: 04/24/1941, 79 y.o.   MRN: [id number]  HPI Here due to worsening of restless legs symptoms This visit occurred during the SARS-CoV-2 public health emergency.  Safety protocols were in place, including screening questions prior to the visit, additional usage of staff PPE, and extensive cleaning of exam room while observing appropriate contact time as indicated for disinfecting solutions.   Increased symptoms recently Cramps in calves and down to great toes Takes the clonazepam every night--and it helps her initiate, but her legs will awaken her  Occasionally gets some "jumping" in legs during the day Mostly at night though  Current Outpatient Medications on File Prior to Visit  Medication Sig Dispense Refill  . acetaminophen (TYLENOL) 325 MG tablet Take 2 tablets (650 mg total) by mouth every 6 (six) hours as needed for mild pain (or Fever >/= 101).    Marland Kitchen albuterol (PROAIR HFA) 108 (90 Base) MCG/ACT inhaler INHALE 2 PUFFS INTO THE LUNGS EVERY 6 HOURS AS NEEDED FOR WHEEZING OR SHORTNESS OF BREATH 8.5 g 2  . clonazePAM (KLONOPIN) 0.5 MG tablet TAKE 1 TABLET(0.5 MG) BY MOUTH DAILY AS NEEDED FOR ANXIETY 30 tablet 0  . feeding supplement, ENSURE ENLIVE, (ENSURE ENLIVE) LIQD Take 237 mLs by mouth 2 (two) times daily between meals. 60 Bottle 1  . ferrous sulfate 325 (65 FE) MG tablet Take 1 tablet (325 mg total) by mouth 2 (two) times daily. 60 tablet 3  . lisinopril-hydrochlorothiazide (ZESTORETIC) 20-12.5 MG tablet Take 2 tablets by mouth daily. 180 tablet 3  . metFORMIN (GLUCOPHAGE-XR) 500 MG 24 hr tablet TAKE 1 TABLET(500 MG) BY MOUTH DAILY WITH BREAKFAST 30 tablet 11  . omeprazole (PRILOSEC) 40 MG capsule TAKE 1 CAPSULE(40 MG) BY MOUTH TWICE DAILY BEFORE A MEAL 180 capsule 3  . ONE TOUCH ULTRA TEST test strip TEST ONCE DAILY AS DIRECTED 100 each 3  . simvastatin (ZOCOR) 80 MG tablet TAKE 1 TABLET(80 MG) BY MOUTH DAILY 90 tablet 3  .  sucralfate (CARAFATE) 1 g tablet TAKE 1 TABLET BY MOUTH BEFORE MEALS AND EVERY NIGHT AT BEDTIME 120 tablet 0  . vitamin B-12 (CYANOCOBALAMIN) 1000 MCG tablet Take 1,000 mcg by mouth daily.     No current facility-administered medications on file prior to visit.    Allergies  Allergen Reactions  . Nifedipine     REACTION: cough  . Metformin Other (See Comments)    REACTION: diarrhea when dosage is increased, ok when taking one tablet daily, patient aware that she is on metformin when it is listed that she is allergic    Past Medical History:  Diagnosis Date  . Anemia   . ARF (acute renal failure) ([place])   . Blood transfusion without reported diagnosis   . Diabetes mellitus type II   . GI (gastrointestinal bleed)   . GI AVM (gastrointestinal arteriovenous vascular malformation)    stomach  . History of echocardiogram 12/2013   a. 12/2013: EF 60-65%, DD, mild LVH, nl RV size & systolic function, mild MR/TR, mild AS, nl RVSP; b. TTE 04/2017: EF 60-65%, normal wall motion, GR1DD, mild MR, left atrium normal in size, normal RV systolic function, PASP normal   . History of nuclear stress test    a. 12/2013: low risk, no sig WMA, nondiag EKG 2/2 baseline LVH w/ repol abnl, no sig ischemia, EF 70% no artifact  . HLD (hyperlipidemia)   . HTN (hypertension)   .  Hypercholesterolemia   . IDA (iron deficiency anemia)   . Polyp of colon, adenomatous   . RLS (restless legs syndrome)     Past Surgical History:  Procedure Laterality Date  . ABDOMINAL ADHESION SURGERY  11/2001   Dr. Tamala Julian  . ABDOMINAL HYSTERECTOMY  1976   RSO (fibroid)  . COLONOSCOPY WITH PROPOFOL N/A 07/07/2016   Procedure: COLONOSCOPY WITH PROPOFOL;  Surgeon: Manya Silvas, MD;  Location: [facility] END[facility]COPY;  Service: Endoscopy;  Laterality: N/A;  . COLONOSCOPY WITH PROPOFOL N/A 12/14/2016   Procedure: COLONOSCOPY WITH PROPOFOL;  Surgeon: Manya Silvas, MD;  Location: [facility] ENDOSCOPY;  Service: Endoscopy;  Laterality: N/A;  .  DOBUTAMINE STRESS ECHO  11/10   normal  . DOPPLER ECHOCARDIOGRAPHY     EF 55%, mild MR, TR  . ENTEROSCOPY N/A 06/08/2018   Procedure: ENTEROSCOPY;  Surgeon: Lin Landsman, MD;  Location: [facility] ENDOSCOPY;  Service: Gastroenterology;  Laterality: N/A;  . ESOPHAGOGASTRODUODENOSCOPY  07/2006   multiple angioectasias  . ESOPHAGOGASTRODUODENOSCOPY Left 03/12/2015   Procedure: place tube in throat and evaluate stomach and duodenum for source of bleeding. Cauterize if concern for rebleeding.;  Surgeon: Hulen Luster, MD;  Location: [facility] ENDOSCOPY;  Service: Endoscopy;  Laterality: Left;  . ESOPHAGOGASTRODUODENOSCOPY N/A 12/14/2016   Procedure: ESOPHAGOGASTRODUODENOSCOPY (EGD);  Surgeon: Manya Silvas, MD;  Location: [facility] ENDOSCOPY;  Service: Endoscopy;  Laterality: N/A;  . ESOPHAGOGASTRODUODENOSCOPY N/A 07/19/2017   Procedure: ESOPHAGOGASTRODUODENOSCOPY (EGD);  Surgeon: Lin Landsman, MD;  Location: [facility] ENDOSCOPY;  Service: Gastroenterology;  Laterality: N/A;  . ESOPHAGOGASTRODUODENOSCOPY (EGD) WITH PROPOFOL N/A 11/23/2015   Procedure: ESOPHAGOGASTRODUODENOSCOPY (EGD) WITH PROPOFOL;  Surgeon: Hulen Luster, MD;  Location: [facility] ENDOSCOPY;  Service: Gastroenterology;  Laterality: N/A;  . ESOPHAGOGASTRODUODENOSCOPY (EGD) WITH PROPOFOL N/A 07/07/2016   Procedure: ESOPHAGOGASTRODUODENOSCOPY (EGD) WITH PROPOFOL;  Surgeon: Manya Silvas, MD;  Location: [facility] ENDOSCOPY;  Service: Endoscopy;  Laterality: N/A;  . ESOPHAGOGASTRODUODENOSCOPY (EGD) WITH PROPOFOL N/A 05/19/2017   Procedure: ESOPHAGOGASTRODUODENOSCOPY (EGD) WITH PROPOFOL;  Surgeon: Manya Silvas, MD;  Location: [facility] ENDOSCOPY;  Service: Endoscopy;  Laterality: N/A;  . laryngeal polyp  10/2008   Dr. Tami Ribas  . TOP      Family History  Problem Relation Age of Onset  . Stroke Father   . Cancer Sister        breast cancer  . Hypertension Mother   . Hypertension Other        sibling  . Diabetes Other        grandmother    Social  History   Socioeconomic History  . Marital status: Widowed    Spouse name: Not on file  . Number of children: 1  . Years of education: Not on file  . Highest education level: Not on file  Occupational History  . Occupation: Regulatory affairs officer    Comment: retired  Tobacco Use  . Smoking status: Current Every Day Smoker    Packs/day: 1.00    Types: Cigarettes  . Smokeless tobacco: Never Used  Substance and Sexual Activity  . Alcohol use: No  . Drug use: No  . Sexual activity: Not on file  Other Topics Concern  . Not on file  Social History Narrative   Lives with sister      No living will   Requests son Orson [employer] as health care POA   Would accept resuscitation --but no prolonged machines   Not sure about feeding tubes   Social Determinants of Health   Financial Resource Strain:   .  Difficulty of Paying Living Expenses: Not on file  Food Insecurity:   . Worried About Charity fundraiser in the Last Year: Not on file  . Ran Out of Food in the Last Year: Not on file  Transportation Needs:   . Lack of Transportation (Medical): Not on file  . Lack of Transportation (Non-Medical): Not on file  Physical Activity:   . Days of Exercise per Week: Not on file  . Minutes of Exercise per Session: Not on file  Stress:   . Feeling of Stress : Not on file  Social Connections:   . Frequency of Communication with Friends and Family: Not on file  . Frequency of Social Gatherings with Friends and Family: Not on file  . Attends Religious Services: Not on file  . Active Member of Clubs or Organizations: Not on file  . Attends Archivist Meetings: Not on file  . Marital Status: Not on file  Intimate Partner Violence:   . Fear of Current or Ex-Partner: Not on file  . Emotionally Abused: Not on file  . Physically Abused: Not on file  . Sexually Abused: Not on file   Review of Systems Hasn't seen any black stools or blood Appetite is fine    Objective:   Physical Exam    Constitutional: No distress.  Cardiovascular:  Faint pulse in right foot Absent in left foot (and foot cool)  Musculoskeletal:     Comments: No calf swelling or tenderness           Assessment & Plan:

## 2019-12-03 ENCOUNTER — Other Ambulatory Visit: Payer: Self-pay | Admitting: *Deleted

## 2019-12-03 MED ORDER — CLONAZEPAM 0.5 MG PO TABS: 0.5000 mg | ORAL_TABLET | Freq: Every evening | ORAL | 0 refills | Status: DC | PRN

## 2019-12-03 NOTE — Telephone Encounter (Signed)
Patient left a voicemail requesting a new script for Clonazepam with new directions.. Patient stated that Dr. Silvio Pate told her to increase her Clonazepam to two at night to help with her restless legs.. Patient stated that she is running out of the medication since the directions have changed. Patient requested that a new script be sent in with the new directions.  Name of Medication: Clonazepam Name of Pharmacy: [facility] Last Fill or Written Date and Quantity: 11/06/19 #30 Last Office Visit and Type: 11/26/19 Next Office Visit and Type: 10/05/20 Last Controlled Substance Agreement Date: none Last UDS: none

## 2019-12-04 ENCOUNTER — Other Ambulatory Visit: Payer: Self-pay

## 2019-12-04 NOTE — Telephone Encounter (Signed)
I sent the new Rx yesterday 0.5-1 bedtime prn #60 sent

## 2019-12-04 NOTE — Telephone Encounter (Signed)
Spoke to pt

## 2019-12-04 NOTE — Telephone Encounter (Signed)
I caled pt to f/u on the increase of clonazepam to help her restless leg. She said she has been taking 2 a night since this past weekend and it has really helped. Advised her I would send the message to Dr Silvio Pate so he can fix the rx accordingly.

## 2019-12-04 NOTE — Telephone Encounter (Signed)
Last filled 11-06-19 #30

## 2020-01-03 ENCOUNTER — Other Ambulatory Visit: Payer: Self-pay

## 2020-01-03 NOTE — Telephone Encounter (Signed)
Last written 12-03-19 #60 Last OV 11-26-19 Next OV 10-05-20 [facility]

## 2020-01-03 NOTE — Addendum Note (Signed)
Addended by: Pilar Grammes on: 01/03/2020 03:55 PM   Modules accepted: Orders

## 2020-01-04 MED ORDER — CLONAZEPAM 0.5 MG PO TABS: 0.5000 mg | ORAL_TABLET | Freq: Every evening | ORAL | 0 refills | Status: DC | PRN

## 2020-01-04 NOTE — Addendum Note (Signed)
Addended by: Viviana Simpler I on: 01/04/2020 05:19 PM   Modules accepted: Orders

## 2020-01-05 ENCOUNTER — Encounter: Payer: Self-pay | Admitting: Emergency Medicine

## 2020-01-05 ENCOUNTER — Inpatient Hospital Stay
Admission: EM | Admit: 2020-01-05 | Discharge: 2020-01-07 | DRG: 348 | Disposition: A | Discharge status: Home / Self Care | Payer: Medicare HMO | Attending: Internal Medicine | Admitting: Internal Medicine

## 2020-01-05 ENCOUNTER — Emergency Department: Payer: Medicare HMO

## 2020-01-05 ENCOUNTER — Other Ambulatory Visit: Payer: Self-pay

## 2020-01-05 DIAGNOSIS — Z20822 Contact with and (suspected) exposure to covid-19: Secondary | ICD-10-CM | POA: Diagnosis not present

## 2020-01-05 DIAGNOSIS — R55 Syncope and collapse: Secondary | ICD-10-CM | POA: Diagnosis present

## 2020-01-05 DIAGNOSIS — Z7984 Long term (current) use of oral hypoglycemic drugs: Secondary | ICD-10-CM | POA: Diagnosis not present

## 2020-01-05 DIAGNOSIS — E785 Hyperlipidemia, unspecified: Secondary | ICD-10-CM | POA: Diagnosis not present

## 2020-01-05 DIAGNOSIS — Z833 Family history of diabetes mellitus: Secondary | ICD-10-CM | POA: Diagnosis not present

## 2020-01-05 DIAGNOSIS — I5032 Chronic diastolic (congestive) heart failure: Secondary | ICD-10-CM | POA: Diagnosis not present

## 2020-01-05 DIAGNOSIS — N179 Acute kidney failure, unspecified: Secondary | ICD-10-CM

## 2020-01-05 DIAGNOSIS — Z743 Need for continuous supervision: Secondary | ICD-10-CM | POA: Diagnosis not present

## 2020-01-05 DIAGNOSIS — S3991XA Unspecified injury of abdomen, initial encounter: Secondary | ICD-10-CM | POA: Diagnosis not present

## 2020-01-05 DIAGNOSIS — N183 Chronic kidney disease, stage 3 unspecified: Secondary | ICD-10-CM | POA: Diagnosis not present

## 2020-01-05 DIAGNOSIS — K922 Gastrointestinal hemorrhage, unspecified: Secondary | ICD-10-CM | POA: Diagnosis not present

## 2020-01-05 DIAGNOSIS — Z823 Family history of stroke: Secondary | ICD-10-CM | POA: Diagnosis not present

## 2020-01-05 DIAGNOSIS — D631 Anemia in chronic kidney disease: Secondary | ICD-10-CM | POA: Diagnosis present

## 2020-01-05 DIAGNOSIS — Z888 Allergy status to other drugs, medicaments and biological substances status: Secondary | ICD-10-CM | POA: Diagnosis not present

## 2020-01-05 DIAGNOSIS — E7800 Pure hypercholesterolemia, unspecified: Secondary | ICD-10-CM | POA: Diagnosis present

## 2020-01-05 DIAGNOSIS — Q2733 Arteriovenous malformation of digestive system vessel: Secondary | ICD-10-CM | POA: Diagnosis not present

## 2020-01-05 DIAGNOSIS — I248 Other forms of acute ischemic heart disease: Secondary | ICD-10-CM | POA: Diagnosis not present

## 2020-01-05 DIAGNOSIS — Z803 Family history of malignant neoplasm of breast: Secondary | ICD-10-CM

## 2020-01-05 DIAGNOSIS — E861 Hypovolemia: Secondary | ICD-10-CM | POA: Diagnosis present

## 2020-01-05 DIAGNOSIS — Z720 Tobacco use: Secondary | ICD-10-CM | POA: Diagnosis not present

## 2020-01-05 DIAGNOSIS — D500 Iron deficiency anemia secondary to blood loss (chronic): Secondary | ICD-10-CM | POA: Diagnosis not present

## 2020-01-05 DIAGNOSIS — D62 Acute posthemorrhagic anemia: Secondary | ICD-10-CM | POA: Diagnosis not present

## 2020-01-05 DIAGNOSIS — Z8249 Family history of ischemic heart disease and other diseases of the circulatory system: Secondary | ICD-10-CM | POA: Diagnosis not present

## 2020-01-05 DIAGNOSIS — F17210 Nicotine dependence, cigarettes, uncomplicated: Secondary | ICD-10-CM | POA: Diagnosis present

## 2020-01-05 DIAGNOSIS — D649 Anemia, unspecified: Secondary | ICD-10-CM

## 2020-01-05 DIAGNOSIS — Z79899 Other long term (current) drug therapy: Secondary | ICD-10-CM

## 2020-01-05 DIAGNOSIS — K5520 Angiodysplasia of colon without hemorrhage: Secondary | ICD-10-CM | POA: Diagnosis not present

## 2020-01-05 DIAGNOSIS — R000 Tachycardia, unspecified: Secondary | ICD-10-CM | POA: Diagnosis not present

## 2020-01-05 DIAGNOSIS — E1165 Type 2 diabetes mellitus with hyperglycemia: Secondary | ICD-10-CM | POA: Diagnosis not present

## 2020-01-05 DIAGNOSIS — R0602 Shortness of breath: Secondary | ICD-10-CM | POA: Diagnosis not present

## 2020-01-05 DIAGNOSIS — K219 Gastro-esophageal reflux disease without esophagitis: Secondary | ICD-10-CM | POA: Diagnosis present

## 2020-01-05 DIAGNOSIS — K31819 Angiodysplasia of stomach and duodenum without bleeding: Secondary | ICD-10-CM | POA: Diagnosis not present

## 2020-01-05 DIAGNOSIS — E1151 Type 2 diabetes mellitus with diabetic peripheral angiopathy without gangrene: Secondary | ICD-10-CM | POA: Diagnosis not present

## 2020-01-05 DIAGNOSIS — Z87891 Personal history of nicotine dependence: Secondary | ICD-10-CM | POA: Diagnosis present

## 2020-01-05 DIAGNOSIS — I10 Essential (primary) hypertension: Secondary | ICD-10-CM

## 2020-01-05 DIAGNOSIS — R778 Other specified abnormalities of plasma proteins: Secondary | ICD-10-CM | POA: Diagnosis present

## 2020-01-05 DIAGNOSIS — F419 Anxiety disorder, unspecified: Secondary | ICD-10-CM | POA: Diagnosis present

## 2020-01-05 DIAGNOSIS — E1169 Type 2 diabetes mellitus with other specified complication: Secondary | ICD-10-CM | POA: Diagnosis present

## 2020-01-05 DIAGNOSIS — K31811 Angiodysplasia of stomach and duodenum with bleeding: Secondary | ICD-10-CM | POA: Diagnosis not present

## 2020-01-05 DIAGNOSIS — Z90710 Acquired absence of both cervix and uterus: Secondary | ICD-10-CM | POA: Diagnosis not present

## 2020-01-05 DIAGNOSIS — I129 Hypertensive chronic kidney disease with stage 1 through stage 4 chronic kidney disease, or unspecified chronic kidney disease: Secondary | ICD-10-CM | POA: Diagnosis not present

## 2020-01-05 DIAGNOSIS — R0902 Hypoxemia: Secondary | ICD-10-CM | POA: Diagnosis not present

## 2020-01-05 DIAGNOSIS — N1831 Chronic kidney disease, stage 3a: Secondary | ICD-10-CM | POA: Diagnosis present

## 2020-01-05 DIAGNOSIS — R42 Dizziness and giddiness: Secondary | ICD-10-CM | POA: Diagnosis not present

## 2020-01-05 DIAGNOSIS — M50122 Cervical disc disorder at C5-C6 level with radiculopathy: Secondary | ICD-10-CM | POA: Diagnosis not present

## 2020-01-05 DIAGNOSIS — E1122 Type 2 diabetes mellitus with diabetic chronic kidney disease: Secondary | ICD-10-CM | POA: Diagnosis present

## 2020-01-05 DIAGNOSIS — I130 Hypertensive heart and chronic kidney disease with heart failure and stage 1 through stage 4 chronic kidney disease, or unspecified chronic kidney disease: Secondary | ICD-10-CM | POA: Diagnosis not present

## 2020-01-05 DIAGNOSIS — E1129 Type 2 diabetes mellitus with other diabetic kidney complication: Secondary | ICD-10-CM | POA: Diagnosis present

## 2020-01-05 DIAGNOSIS — R7989 Other specified abnormal findings of blood chemistry: Secondary | ICD-10-CM | POA: Diagnosis not present

## 2020-01-05 LAB — APTT: aPTT: 24 seconds (ref 24–36)

## 2020-01-05 LAB — CBC WITH DIFFERENTIAL/PLATELET
Abs Immature Granulocytes: 0.02 10*3/uL (ref 0.00–0.07)
Basophils Absolute: 0 10*3/uL (ref 0.0–0.1)
Basophils Relative: 0 %
Eosinophils Absolute: 0 10*3/uL (ref 0.0–0.5)
Eosinophils Relative: 0 %
HCT: 15.9 % — ABNORMAL LOW (ref 36.0–46.0)
Hemoglobin: 4.4 g/dL — CL (ref 12.0–15.0)
Immature Granulocytes: 1 %
Lymphocytes Relative: 18 %
Lymphs Abs: 0.8 10*3/uL (ref 0.7–4.0)
MCH: 24.7 pg — ABNORMAL LOW (ref 26.0–34.0)
MCHC: 27.7 g/dL — ABNORMAL LOW (ref 30.0–36.0)
MCV: 89.3 fL (ref 80.0–100.0)
Monocytes Absolute: 0.4 10*3/uL (ref 0.1–1.0)
Monocytes Relative: 10 %
Neutro Abs: 3.2 10*3/uL (ref 1.7–7.7)
Neutrophils Relative %: 71 %
Platelets: 287 10*3/uL (ref 150–400)
RBC: 1.78 MIL/uL — ABNORMAL LOW (ref 3.87–5.11)
RDW: 18.3 % — ABNORMAL HIGH (ref 11.5–15.5)
WBC: 4.4 10*3/uL (ref 4.0–10.5)
nRBC: 0 % (ref 0.0–0.2)

## 2020-01-05 LAB — TROPONIN I (HIGH SENSITIVITY)
Troponin I (High Sensitivity): 196 ng/L (ref <18)
Troponin I (High Sensitivity): 245 ng/L (ref <18)
Troponin I (High Sensitivity): 938 ng/L (ref <18)

## 2020-01-05 LAB — RESPIRATORY PANEL BY RT PCR (FLU A&B, COVID)
Influenza A by PCR: NEGATIVE
Influenza B by PCR: NEGATIVE
SARS Coronavirus 2 by RT PCR: NEGATIVE

## 2020-01-05 LAB — COMPREHENSIVE METABOLIC PANEL
ALT: 10 U/L (ref 0–44)
AST: 20 U/L (ref 15–41)
Albumin: 3 g/dL — ABNORMAL LOW (ref 3.5–5.0)
Alkaline Phosphatase: 29 U/L — ABNORMAL LOW (ref 38–126)
Anion gap: 13 (ref 5–15)
BUN: 60 mg/dL — ABNORMAL HIGH (ref 8–23)
CO2: 17 mmol/L — ABNORMAL LOW (ref 22–32)
Calcium: 9.1 mg/dL (ref 8.9–10.3)
Chloride: 107 mmol/L (ref 98–111)
Creatinine, Ser: 1.49 mg/dL — ABNORMAL HIGH (ref 0.44–1.00)
GFR calc Af Amer: 39 mL/min — ABNORMAL LOW (ref >60)
GFR calc non Af Amer: 34 mL/min — ABNORMAL LOW (ref >60)
Glucose, Bld: 258 mg/dL — ABNORMAL HIGH (ref 70–99)
Potassium: 4.6 mmol/L (ref 3.5–5.1)
Sodium: 137 mmol/L (ref 135–145)
Total Bilirubin: 0.5 mg/dL (ref 0.3–1.2)
Total Protein: 5.6 g/dL — ABNORMAL LOW (ref 6.5–8.1)

## 2020-01-05 LAB — BRAIN NATRIURETIC PEPTIDE: B Natriuretic Peptide: 343 pg/mL — ABNORMAL HIGH (ref 0.0–100.0)

## 2020-01-05 LAB — URINALYSIS, ROUTINE W REFLEX MICROSCOPIC
Bilirubin Urine: NEGATIVE
Glucose, UA: NEGATIVE mg/dL
Hgb urine dipstick: NEGATIVE
Ketones, ur: NEGATIVE mg/dL
Leukocytes,Ua: NEGATIVE
Nitrite: NEGATIVE
Protein, ur: NEGATIVE mg/dL
Specific Gravity, Urine: 1.033 — ABNORMAL HIGH (ref 1.005–1.030)
pH: 5 (ref 5.0–8.0)

## 2020-01-05 LAB — PROTIME-INR
INR: 1.1 (ref 0.8–1.2)
Prothrombin Time: 14.4 seconds (ref 11.4–15.2)

## 2020-01-05 LAB — GLUCOSE, CAPILLARY: Glucose-Capillary: 98 mg/dL (ref 70–99)

## 2020-01-05 LAB — PREPARE RBC (CROSSMATCH)

## 2020-01-05 MED ORDER — ACETAMINOPHEN 650 MG RE SUPP: 650.0000 mg | Freq: Four times a day (QID) | RECTAL | Status: DC | PRN

## 2020-01-05 MED ORDER — PANTOPRAZOLE SODIUM 40 MG IV SOLR: 40.0000 mg | Freq: Two times a day (BID) | INTRAVENOUS | Status: DC

## 2020-01-05 MED ORDER — ACETAMINOPHEN 325 MG PO TABS
650.0000 mg | ORAL_TABLET | Freq: Four times a day (QID) | ORAL | Status: DC | PRN
  Administered 2020-01-07: 650 mg via ORAL
  Filled 2020-01-05: qty 2

## 2020-01-05 MED ORDER — INSULIN ASPART 100 UNIT/ML ~~LOC~~ SOLN: 0.0000 [IU] | Freq: Every day | SUBCUTANEOUS | Status: DC

## 2020-01-05 MED ORDER — SODIUM CHLORIDE 0.9 % IV SOLN
80.0000 mg | Freq: Once | INTRAVENOUS | Status: AC
  Administered 2020-01-05: 80 mg via INTRAVENOUS
  Filled 2020-01-05: qty 80

## 2020-01-05 MED ORDER — VITAMIN B-12 1000 MCG PO TABS
1000.0000 ug | ORAL_TABLET | Freq: Every day | ORAL | Status: DC
  Administered 2020-01-05 – 2020-01-07 (×3): 1000 ug via ORAL
  Filled 2020-01-05 (×3): qty 1

## 2020-01-05 MED ORDER — SODIUM CHLORIDE 0.9 % IV SOLN
10.0000 mL/h | Freq: Once | INTRAVENOUS | Status: AC
  Administered 2020-01-06: 10 mL/h via INTRAVENOUS

## 2020-01-05 MED ORDER — SODIUM CHLORIDE 0.9 % IV BOLUS
1000.0000 mL | Freq: Once | INTRAVENOUS | Status: AC
  Administered 2020-01-05: 1000 mL via INTRAVENOUS

## 2020-01-05 MED ORDER — ALBUTEROL SULFATE HFA 108 (90 BASE) MCG/ACT IN AERS: 2.0000 | INHALATION_SPRAY | RESPIRATORY_TRACT | Status: DC | PRN

## 2020-01-05 MED ORDER — CLONAZEPAM 0.5 MG PO TABS
0.5000 mg | ORAL_TABLET | Freq: Every evening | ORAL | Status: DC | PRN
  Administered 2020-01-05 – 2020-01-07 (×2): 0.5 mg via ORAL
  Filled 2020-01-05 (×2): qty 1

## 2020-01-05 MED ORDER — IOHEXOL 300 MG/ML  SOLN
75.0000 mL | Freq: Once | INTRAMUSCULAR | Status: AC | PRN
  Administered 2020-01-05: 75 mL via INTRAVENOUS

## 2020-01-05 MED ORDER — SODIUM CHLORIDE 0.9 % IV SOLN
8.0000 mg/h | INTRAVENOUS | Status: DC
  Administered 2020-01-05 – 2020-01-06 (×2): 8 mg/h via INTRAVENOUS
  Filled 2020-01-05 (×3): qty 80

## 2020-01-05 MED ORDER — NICOTINE 21 MG/24HR TD PT24
21.0000 mg | MEDICATED_PATCH | Freq: Every day | TRANSDERMAL | Status: DC
  Administered 2020-01-06 – 2020-01-07 (×2): 21 mg via TRANSDERMAL
  Filled 2020-01-05 (×2): qty 1

## 2020-01-05 MED ORDER — ONDANSETRON HCL 4 MG/2ML IJ SOLN: 4.0000 mg | Freq: Four times a day (QID) | INTRAMUSCULAR | Status: DC | PRN

## 2020-01-05 MED ORDER — INSULIN ASPART 100 UNIT/ML ~~LOC~~ SOLN
0.0000 [IU] | Freq: Three times a day (TID) | SUBCUTANEOUS | Status: DC
  Administered 2020-01-07: 2 [IU] via SUBCUTANEOUS
  Filled 2020-01-05: qty 1

## 2020-01-05 MED ORDER — ONDANSETRON HCL 4 MG PO TABS: 4.0000 mg | ORAL_TABLET | Freq: Four times a day (QID) | ORAL | Status: DC | PRN

## 2020-01-05 MED ORDER — FERROUS SULFATE 325 (65 FE) MG PO TABS
325.0000 mg | ORAL_TABLET | Freq: Two times a day (BID) | ORAL | Status: DC
  Administered 2020-01-05 – 2020-01-07 (×4): 325 mg via ORAL
  Filled 2020-01-05 (×4): qty 1

## 2020-01-05 MED ORDER — ATORVASTATIN CALCIUM 20 MG PO TABS
40.0000 mg | ORAL_TABLET | Freq: Every day | ORAL | Status: DC
  Administered 2020-01-06: 40 mg via ORAL
  Filled 2020-01-05: qty 2

## 2020-01-05 MED ORDER — ALBUTEROL SULFATE (2.5 MG/3ML) 0.083% IN NEBU: 2.5000 mg | INHALATION_SOLUTION | RESPIRATORY_TRACT | Status: DC | PRN

## 2020-01-05 NOTE — ED Notes (Addendum)
Date and time results received: 01/05/20 0902 (use smartphrase ".now" to insert current time)  Test: Hgb Critical Value: 4.4  Name of Provider Notified: Dr. Jari Pigg

## 2020-01-05 NOTE — ED Notes (Signed)
Pt transported to CT ?

## 2020-01-05 NOTE — ED Notes (Signed)
This RN attempted IV access x 2. Brandy RN to attempt access

## 2020-01-05 NOTE — ED Notes (Signed)
Pt resting in low fowlers position.  No acute changes noted at this time.  Awake, alert, oriented x4.  GCS 15.  Denies pain, only complaint is "groggy"  Currently receiving 1 unit PRBC, no reaction noted to blood product given.  Will continue to monitor/reassess.

## 2020-01-05 NOTE — Consult Note (Signed)
Kathryn Lame, MD [facility]  [address] [place], [place] [address] Phone: [phone] Fax : [phone]  Consultation  Referring Provider:     Dr. Blaine Hamper Primary Care Physician:  Venia Carbon, MD Primary Gastroenterologist:  Dr. Marius Ditch         Reason for Consultation:     Anemia  Date of Admission:  01/05/2020 Date of Consultation:  01/05/2020         HPI:   Kathryn Ware is a 79 y.o. female who is followed by Dr. Marius Ditch after previously being seen by Dr. Vira Agar.  The patient has a history of acute on chronic anemia with a small bowel enteroscopy in August 2019 by Dr. Marius Ditch.  At that time multiple AVMs were treated with cautery and clips.  The patient now comes in with a hemoglobin of 4.4 with her last hemoglobin in a month ago being 9.7.  The patient denies any abdominal pain nausea vomiting fevers or chills.  She does report that she has lost approximately 13 pounds over the last year without trying.  There is no report of any black stools or bloody stools.  She also denies any NSAID use. The patient had presented with a report of dizziness and also reported that she had her second Covid vaccine a few days ago.  She did report a syncopal episode yesterday.  She is not sure if she hit her head.  The patient had a normal CT scan of the brain and a abdominal CT scan showed no acute findings with some right renal atrophy with chronic right renal artery vascular disease.  Past Medical History:  Diagnosis Date  . Anemia   . ARF (acute renal failure) ([place])   . Blood transfusion without reported diagnosis   . Diabetes mellitus type II   . GI (gastrointestinal bleed)   . GI AVM (gastrointestinal arteriovenous vascular malformation)    stomach  . History of echocardiogram 12/2013   a. 12/2013: EF 60-65%, DD, mild LVH, nl RV size & systolic function, mild MR/TR, mild AS, nl RVSP; b. TTE 04/2017: EF 60-65%, normal wall motion, GR1DD, mild MR, left atrium normal in size, normal RV systolic  function, PASP normal   . History of nuclear stress test    a. 12/2013: low risk, no sig WMA, nondiag EKG 2/2 baseline LVH w/ repol abnl, no sig ischemia, EF 70% no artifact  . HLD (hyperlipidemia)   . HTN (hypertension)   . Hypercholesterolemia   . IDA (iron deficiency anemia)   . Polyp of colon, adenomatous   . RLS (restless legs syndrome)     Past Surgical History:  Procedure Laterality Date  . ABDOMINAL ADHESION SURGERY  11/2001   Dr. Tamala Julian  . ABDOMINAL HYSTERECTOMY  1976   RSO (fibroid)  . COLONOSCOPY WITH PROPOFOL N/A 07/07/2016   Procedure: COLONOSCOPY WITH PROPOFOL;  Surgeon: Manya Silvas, MD;  Location: [facility] ENDOSCOPY;  Service: Endoscopy;  Laterality: N/A;  . COLONOSCOPY WITH PROPOFOL N/A 12/14/2016   Procedure: COLONOSCOPY WITH PROPOFOL;  Surgeon: Manya Silvas, MD;  Location: [facility] ENDOSCOPY;  Service: Endoscopy;  Laterality: N/A;  . DOBUTAMINE STRESS ECHO  11/10   normal  . DOPPLER ECHOCARDIOGRAPHY     EF 55%, mild MR, TR  . ENTEROSCOPY N/A 06/08/2018   Procedure: ENTEROSCOPY;  Surgeon: Lin Landsman, MD;  Location: [facility] ENDOSCOPY;  Service: Gastroenterology;  Laterality: N/A;  . ESOPHAGOGASTRODUODENOSCOPY  07/2006   multiple angioectasias  . ESOPHAGOGASTRODUODENOSCOPY Left 03/12/2015  Procedure: place tube in throat and evaluate stomach and duodenum for source of bleeding. Cauterize if concern for rebleeding.;  Surgeon: Hulen Luster, MD;  Location: [facility] ENDOSCOPY;  Service: Endoscopy;  Laterality: Left;  . ESOPHAGOGASTRODUODENOSCOPY N/A 12/14/2016   Procedure: ESOPHAGOGASTRODUODENOSCOPY (EGD);  Surgeon: Manya Silvas, MD;  Location: [facility] ENDOSCOPY;  Service: Endoscopy;  Laterality: N/A;  . ESOPHAGOGASTRODUODENOSCOPY N/A 07/19/2017   Procedure: ESOPHAGOGASTRODUODENOSCOPY (EGD);  Surgeon: Lin Landsman, MD;  Location: [facility] ENDOSCOPY;  Service: Gastroenterology;  Laterality: N/A;  . ESOPHAGOGASTRODUODENOSCOPY (EGD) WITH PROPOFOL N/A 11/23/2015   Procedure:  ESOPHAGOGASTRODUODENOSCOPY (EGD) WITH PROPOFOL;  Surgeon: Hulen Luster, MD;  Location: [facility] ENDOSCOPY;  Service: Gastroenterology;  Laterality: N/A;  . ESOPHAGOGASTRODUODENOSCOPY (EGD) WITH PROPOFOL N/A 07/07/2016   Procedure: ESOPHAGOGASTRODUODENOSCOPY (EGD) WITH PROPOFOL;  Surgeon: Manya Silvas, MD;  Location: [facility] ENDOSCOPY;  Service: Endoscopy;  Laterality: N/A;  . ESOPHAGOGASTRODUODENOSCOPY (EGD) WITH PROPOFOL N/A 05/19/2017   Procedure: ESOPHAGOGASTRODUODENOSCOPY (EGD) WITH PROPOFOL;  Surgeon: Manya Silvas, MD;  Location: [facility] ENDOSCOPY;  Service: Endoscopy;  Laterality: N/A;  . laryngeal polyp  10/2008   Dr. Tami Ribas  . TOP      Prior to Admission medications   Medication Sig Start Date End Date Taking? Authorizing Provider  acetaminophen (TYLENOL) 325 MG tablet Take 2 tablets (650 mg total) by mouth every 6 (six) hours as needed for mild pain (or Fever >/= 101). 06/09/18  Yes Gouru, Aruna, MD  albuterol (PROAIR HFA) 108 (90 Base) MCG/ACT inhaler INHALE 2 PUFFS INTO THE LUNGS EVERY 6 HOURS AS NEEDED FOR WHEEZING OR SHORTNESS OF BREATH 09/12/19  Yes Venia Carbon, MD  clonazePAM (KLONOPIN) 0.5 MG tablet Take 1-2 tablets (0.5-1 mg total) by mouth at bedtime as needed. For restless legs 01/04/20  Yes Viviana Simpler I, MD  ferrous sulfate 325 (65 FE) MG tablet Take 1 tablet (325 mg total) by mouth 2 (two) times daily. 06/09/18  Yes Gouru, Illene Silver, MD  lisinopril-hydrochlorothiazide (ZESTORETIC) 20-12.5 MG tablet Take 2 tablets by mouth daily. 11/04/19  Yes Viviana Simpler I, MD  metFORMIN (GLUCOPHAGE-XR) 500 MG 24 hr tablet TAKE 1 TABLET(500 MG) BY MOUTH DAILY WITH BREAKFAST 12/31/18  Yes Viviana Simpler I, MD  omeprazole (PRILOSEC) 40 MG capsule TAKE 1 CAPSULE(40 MG) BY MOUTH TWICE DAILY BEFORE A MEAL 08/21/17  Yes Viviana Simpler I, MD  simvastatin (ZOCOR) 80 MG tablet TAKE 1 TABLET(80 MG) BY MOUTH DAILY 10/23/19  Yes Venia Carbon, MD  vitamin B-12 (CYANOCOBALAMIN) 1000 MCG tablet Take  1,000 mcg by mouth daily.   Yes [provider]    Family History  Problem Relation Age of Onset  . Stroke Father   . Cancer Sister        breast cancer  . Hypertension Mother   . Hypertension Other        sibling  . Diabetes Other        grandmother     Social History   Tobacco Use  . Smoking status: Current Every Day Smoker    Packs/day: 1.00    Types: Cigarettes  . Smokeless tobacco: Never Used  Substance Use Topics  . Alcohol use: No  . Drug use: No    Allergies as of 01/05/2020 - Review Complete 01/05/2020  Allergen Reaction Noted  . Nifedipine  09/05/2006  . Metformin Other (See Comments) 09/05/2006    Review of Systems:    All systems reviewed and negative except where noted in HPI.   Physical Exam:  Vital signs  in last 24 hours: Temp:  [97.6 F (36.4 C)-97.8 F (36.6 C)] 97.6 F (36.4 C) (03/14 1147) Pulse Rate:  [89-116] 89 (03/14 1330) Resp:  [19-36] 21 (03/14 1330) BP: (90-132)/(31-111) 130/55 (03/14 1330) SpO2:  [100 %] 100 % (03/14 1330)   General:   Pleasant, cooperative in NAD Head:  Normocephalic and atraumatic. Eyes:   No icterus.   Conjunctiva pink. PERRLA. Ears:  Normal auditory acuity. Neck:  Supple; no masses or thyroidomegaly Lungs: Respirations even and unlabored. Lungs clear to auscultation bilaterally.   No wheezes, crackles, or rhonchi.  Heart:  Regular rate and rhythm;  Without murmur, clicks, rubs or gallops Abdomen:  Soft, nondistended, nontender. Normal bowel sounds. No appreciable masses or hepatomegaly.  No rebound or guarding.  Rectal:  Not performed. Msk:  Symmetrical without gross deformities.    Extremities:  Without edema, cyanosis or clubbing. Neurologic:  Alert and oriented x3;  grossly normal neurologically. Skin:  Intact without significant lesions or rashes. Cervical Nodes:  No significant cervical adenopathy. Psych:  Alert and cooperative. Normal affect.  LAB RESULTS: Recent Labs    01/05/20 0843    WBC 4.4  HGB 4.4*  HCT 15.9*  PLT 287   BMET Recent Labs    01/05/20 0843  NA 137  K 4.6  CL 107  CO2 17*  GLUCOSE 258*  BUN 60*  CREATININE 1.49*  CALCIUM 9.1   LFT Recent Labs    01/05/20 0843  PROT 5.6*  ALBUMIN 3.0*  AST 20  ALT 10  ALKPHOS 29*  BILITOT 0.5   PT/INR Recent Labs    01/05/20 0944  LABPROT 14.4  INR 1.1    STUDIES: CT Head Wo Contrast  Result Date: 01/05/2020 CLINICAL DATA:  Pt to ED via [facility] from home for dizziness. Pt got her second COVID vaccine on Friday and has not felt well since. Per EMS dtr reports episodes of syncope yesterday. Pt is A & O x 4 per EMS. CBG 324, BP 89/52, HR 110. Pt is in NAD. EXAM: CT HEAD WITHOUT CONTRAST CT CERVICAL SPINE WITHOUT CONTRAST TECHNIQUE: Multidetector CT imaging of the head and cervical spine was performed following the standard protocol without intravenous contrast. Multiplanar CT image reconstructions of the cervical spine were also generated. COMPARISON:  None. FINDINGS: CT HEAD FINDINGS Brain: No evidence of acute infarction, hemorrhage, hydrocephalus, extra-axial collection or mass lesion/mass effect. Vascular: No hyperdense vessel or unexpected calcification. Skull: Normal. Negative for fracture or focal lesion. Sinuses/Orbits: Globes and orbits are unremarkable. Visualized sinuses are clear. Other: None. CT CERVICAL SPINE FINDINGS Alignment: Straightened cervical lordosis. No spondylolisthesis/subluxation. Skull base and vertebrae: No acute fracture. No primary bone lesion or focal pathologic process. Soft tissues and spinal canal: No prevertebral fluid or swelling. No visible canal hematoma. Prominent carotid artery calcification on the left. Disc levels: Mild loss of disc height at C 4-C5 and C5-C6 with mild disc bulging and uncovertebral spurring. No convincing disc herniation. Mild neural foraminal narrowing on the left at C4-C5 and C5-C6 and on the right at C5-C6 due to uncovertebral spurring. Upper  chest: No acute findings.  Emphysema noted at the lung apices. Other: None. IMPRESSION: HEAD CT 1. Normal unenhanced CT scan of the brain for age. CERVICAL CT 1. No fracture or acute finding. 2. Lung apices show significant centrilobular emphysema. 3. Prominent calcification of the left carotid artery at the bifurcation. Electronically Signed   By: Lajean Manes M.D.   On: 01/05/2020 09:24   CT Cervical  Spine Wo Contrast  Result Date: 01/05/2020 CLINICAL DATA:  Pt to ED via [facility] from home for dizziness. Pt got her second COVID vaccine on Friday and has not felt well since. Per EMS dtr reports episodes of syncope yesterday. Pt is A & O x 4 per EMS. CBG 324, BP 89/52, HR 110. Pt is in NAD. EXAM: CT HEAD WITHOUT CONTRAST CT CERVICAL SPINE WITHOUT CONTRAST TECHNIQUE: Multidetector CT imaging of the head and cervical spine was performed following the standard protocol without intravenous contrast. Multiplanar CT image reconstructions of the cervical spine were also generated. COMPARISON:  None. FINDINGS: CT HEAD FINDINGS Brain: No evidence of acute infarction, hemorrhage, hydrocephalus, extra-axial collection or mass lesion/mass effect. Vascular: No hyperdense vessel or unexpected calcification. Skull: Normal. Negative for fracture or focal lesion. Sinuses/Orbits: Globes and orbits are unremarkable. Visualized sinuses are clear. Other: None. CT CERVICAL SPINE FINDINGS Alignment: Straightened cervical lordosis. No spondylolisthesis/subluxation. Skull base and vertebrae: No acute fracture. No primary bone lesion or focal pathologic process. Soft tissues and spinal canal: No prevertebral fluid or swelling. No visible canal hematoma. Prominent carotid artery calcification on the left. Disc levels: Mild loss of disc height at C 4-C5 and C5-C6 with mild disc bulging and uncovertebral spurring. No convincing disc herniation. Mild neural foraminal narrowing on the left at C4-C5 and C5-C6 and on the right at C5-C6 due to  uncovertebral spurring. Upper chest: No acute findings.  Emphysema noted at the lung apices. Other: None. IMPRESSION: HEAD CT 1. Normal unenhanced CT scan of the brain for age. CERVICAL CT 1. No fracture or acute finding. 2. Lung apices show significant centrilobular emphysema. 3. Prominent calcification of the left carotid artery at the bifurcation. Electronically Signed   By: Lajean Manes M.D.   On: 01/05/2020 09:24   CT ABDOMEN PELVIS W CONTRAST  Result Date: 01/05/2020 CLINICAL DATA:  Abdominal trauma.  Rule out retroperitoneal bleed. Patient received her second COVID-19 vaccine 2 days ago. Patient has not felt well since. Syncopal episodes yesterday. EXAM: CT ABDOMEN AND PELVIS WITH CONTRAST TECHNIQUE: Multidetector CT imaging of the abdomen and pelvis was performed using the standard protocol following bolus administration of intravenous contrast. CONTRAST:  65mL OMNIPAQUE IOHEXOL 300 MG/ML  SOLN COMPARISON:  None. FINDINGS: Lower chest: No acute abnormality. Hepatobiliary: Normal liver. Small dependent gallstones. No gallbladder wall thickening or inflammation. No bile duct dilation. Pancreas: Unremarkable. No pancreatic ductal dilatation or surrounding inflammatory changes. Spleen: Normal in size without focal abnormality. Adrenals/Urinary Tract: No adrenal masses. Significant right renal atrophy. Decreased enhancement of the right kidney consistent with chronic vascular disease. Left kidney normal in size with small normal enhancement and excretion. Subcentimeter low-density cortical lesion, anterolateral midpole and medial lower pole, too small to characterize but consistent with cysts. No other masses, no stones and no hydronephrosis. Normal ureters. Normal bladder. Stomach/Bowel: Rectum moderately distended with stool. No evidence of bowel obstruction. No bowel wall thickening or inflammation. No evidence of bowel injury. No mesenteric hematoma. Normal appendix visualized. Normal stomach.  Vascular/Lymphatic: Aortic atherosclerosis. No aneurysm. No vascular injury. No enlarged lymph nodes. Reproductive: Status post hysterectomy. No adnexal masses. Other: No ascites. No peritoneal or retroperitoneal hemorrhage. No hernia. Musculoskeletal: No fracture or acute finding. No osteoblastic or osteolytic lesions. IMPRESSION: 1. No acute findings. No evidence of injury to the abdomen or pelvis. Specifically, no intraperitoneal or retroperitoneal hemorrhage. 2. Right renal atrophy and decreased right renal enhancement consistent with chronic right renal artery vascular disease. 3. Aortic atherosclerosis. Electronically Signed  By: Lajean Manes M.D.   On: 01/05/2020 10:09   DG Chest Portable 1 View  Result Date: 01/05/2020 CLINICAL DATA:  Shortness of breath EXAM: PORTABLE CHEST 1 VIEW COMPARISON:  12/17/2018 FINDINGS: Lungs are clear.  No pleural effusion or pneumothorax. The heart is normal in size.  Thoracic aortic atherosclerosis. IMPRESSION: No evidence of acute cardiopulmonary disease. Electronically Signed   By: Julian Hy M.D.   On: 01/05/2020 09:10      Impression / Plan:   Assessment: Principal Problem:   Symptomatic anemia Active Problems:   Type II diabetes mellitus with renal manifestations (HCC)   HTN (hypertension)   HLD (hyperlipidemia)   GERD (gastroesophageal reflux disease)   Anxiety   Acute renal failure superimposed on stage 3a chronic kidney disease (HCC)   Tobacco abuse   Syncope   GIB (gastrointestinal bleeding)   Kathryn Ware is a 79 y.o. y/o female with with a history of small bowel AVMs and cauterization with clipping during a push enteroscopy in 2018.  The patient now presents again with anemia.  The patient did have a colonoscopy in February 2018 with 2 polyps removed that were both tubular adenomas.  The patient denies seeing black stools or bloody stools prior to being found to have profound anemia.  Plan:  The patient has a history of GI  bleeding found on post enteroscopy in the past.  The patient had a colonoscopy in 2018 with 2 small tubular adenomas.  It is more likely that the patient is bleeding from an upper GI source that a lower GI source given her history of AVMs.  The patient will be set up for a repeat post enteroscopy tomorrow by Dr. Marius Ditch.  The patient has been explained the plan and agrees with it.  Thank you for involving me in the care of this patient.      LOS: 0 days   Kathryn Lame, MD  01/05/2020, 2:56 PM Pager [phone] 7am-5pm  Check AMION for 5pm -7am coverage and on weekends   Note: This dictation was prepared with Dragon dictation along with smaller phrase technology. Any transcriptional errors that result from this process are unintentional.

## 2020-01-05 NOTE — H&P (Addendum)
History and Physical    Kathryn Ware TLX:[id number] DOB: [birth year] DOA: 01/05/2020  Referring MD/NP/PA:   PCP: Venia Carbon, MD   Patient coming from:  The patient is coming from home.  At baseline, pt is independent for most of ADL.        Chief Complaint: Dizziness, shortness of breath, generalized weakness, syncope and fall  HPI: Kathryn Ware is a 79 y.o. female with medical history significant of GI bleeding, AVM, hypertension, hyperlipidemia, diabetes mellitus, GERD, RLS, iron deficiency anemia, tobacco abuse, CKD-3, who presents with dizziness, shortness of breath, generalized weakness, syncope and fall.  Patient states that she has not been feeling well in the past several days. She received her second COVID vaccine on Friday.  She has fatigue, dizziness and mild shortness of breath.  No cough or chest pain.  No fever or chills.  She states that she passed out shortly yesterday and fell on the ground.  No significant injury.  No headache or neck pain.  Patient states that she had mild nausea and little vomiting yesterday, which has resolved.  Currently no nausea, vomiting, abdominal pain.  She states that she is taking iron supplement, her stool is dark.  ED Course: pt was found to have positive FOBT, hemoglobin dropped from 9.7 on 11/25/2018 1-4.4, troponin 196 -->245, INR 1.0, PTT 24, negative urinalysis, worsening renal function, temperature normal, soft blood pressure 89/52, which improved with 105/31 after giving 1 L normal saline bolus, tachycardia, RR 21, oxygen saturation 100% on room air.  Chest x-ray negative.  CT of the head is negative.  CT of the C-spine showed no fracture. Abdominal CT scan showed no acute findings with some right renal atrophy with chronic right renal artery vascular disease. Pt is admitted to stepdown as inpatient.  GI, Dr. Allen Norris is consulted.   Review of Systems:   General: no fevers, chills, no body weight gain, has fatigue HEENT: no blurry vision,  hearing changes or sore throat Respiratory: has dyspnea, no coughing, wheezing CV: no chest pain, no palpitations GI: no nausea, vomiting, abdominal pain, diarrhea, constipation GU: no dysuria, burning on urination, increased urinary frequency, hematuria  Ext: no leg edema Neuro: no unilateral weakness, numbness, or tingling, no vision change or hearing loss. Has dizziness, syncope and fall Skin: no rash, no skin tear. MSK: No muscle spasm, no deformity, no limitation of range of movement in spin Heme: No easy bruising.  Travel history: No recent long distant travel.  Allergy:  Allergies  Allergen Reactions  . Nifedipine     REACTION: cough  . Metformin Other (See Comments)    REACTION: diarrhea when dosage is increased, ok when taking one tablet daily, patient aware that she is on metformin when it is listed that she is allergic    Past Medical History:  Diagnosis Date  . Anemia   . ARF (acute renal failure) ([place])   . Blood transfusion without reported diagnosis   . Diabetes mellitus type II   . GI (gastrointestinal bleed)   . GI AVM (gastrointestinal arteriovenous vascular malformation)    stomach  . History of echocardiogram 12/2013   a. 12/2013: EF 60-65%, DD, mild LVH, nl RV size & systolic function, mild MR/TR, mild AS, nl RVSP; b. TTE 04/2017: EF 60-65%, normal wall motion, GR1DD, mild MR, left atrium normal in size, normal RV systolic function, PASP normal   . History of nuclear stress test    a. 12/2013: low risk, no sig WMA,  nondiag EKG 2/2 baseline LVH w/ repol abnl, no sig ischemia, EF 70% no artifact  . HLD (hyperlipidemia)   . HTN (hypertension)   . Hypercholesterolemia   . IDA (iron deficiency anemia)   . Polyp of colon, adenomatous   . RLS (restless legs syndrome)     Past Surgical History:  Procedure Laterality Date  . ABDOMINAL ADHESION SURGERY  11/2001   Dr. Tamala Julian  . ABDOMINAL HYSTERECTOMY  1976   RSO (fibroid)  . COLONOSCOPY WITH PROPOFOL N/A 07/07/2016     Procedure: COLONOSCOPY WITH PROPOFOL;  Surgeon: Manya Silvas, MD;  Location: [facility] ENDOSCOPY;  Service: Endoscopy;  Laterality: N/A;  . COLONOSCOPY WITH PROPOFOL N/A 12/14/2016   Procedure: COLONOSCOPY WITH PROPOFOL;  Surgeon: Manya Silvas, MD;  Location: [facility] ENDOSCOPY;  Service: Endoscopy;  Laterality: N/A;  . DOBUTAMINE STRESS ECHO  11/10   normal  . DOPPLER ECHOCARDIOGRAPHY     EF 55%, mild MR, TR  . ENTEROSCOPY N/A 06/08/2018   Procedure: ENTEROSCOPY;  Surgeon: Lin Landsman, MD;  Location: [facility] ENDOSCOPY;  Service: Gastroenterology;  Laterality: N/A;  . ESOPHAGOGASTRODUODENOSCOPY  07/2006   multiple angioectasias  . ESOPHAGOGASTRODUODENOSCOPY Left 03/12/2015   Procedure: place tube in throat and evaluate stomach and duodenum for source of bleeding. Cauterize if concern for rebleeding.;  Surgeon: Hulen Luster, MD;  Location: [facility] ENDOSCOPY;  Service: Endoscopy;  Laterality: Left;  . ESOPHAGOGASTRODUODENOSCOPY N/A 12/14/2016   Procedure: ESOPHAGOGASTRODUODENOSCOPY (EGD);  Surgeon: Manya Silvas, MD;  Location: [facility] ENDOSCOPY;  Service: Endoscopy;  Laterality: N/A;  . ESOPHAGOGASTRODUODENOSCOPY N/A 07/19/2017   Procedure: ESOPHAGOGASTRODUODENOSCOPY (EGD);  Surgeon: Lin Landsman, MD;  Location: [facility] ENDOSCOPY;  Service: Gastroenterology;  Laterality: N/A;  . ESOPHAGOGASTRODUODENOSCOPY (EGD) WITH PROPOFOL N/A 11/23/2015   Procedure: ESOPHAGOGASTRODUODENOSCOPY (EGD) WITH PROPOFOL;  Surgeon: Hulen Luster, MD;  Location: [facility] ENDOSCOPY;  Service: Gastroenterology;  Laterality: N/A;  . ESOPHAGOGASTRODUODENOSCOPY (EGD) WITH PROPOFOL N/A 07/07/2016   Procedure: ESOPHAGOGASTRODUODENOSCOPY (EGD) WITH PROPOFOL;  Surgeon: Manya Silvas, MD;  Location: [facility] ENDOSCOPY;  Service: Endoscopy;  Laterality: N/A;  . ESOPHAGOGASTRODUODENOSCOPY (EGD) WITH PROPOFOL N/A 05/19/2017   Procedure: ESOPHAGOGASTRODUODENOSCOPY (EGD) WITH PROPOFOL;  Surgeon: Manya Silvas, MD;  Location: [facility] ENDOSCOPY;   Service: Endoscopy;  Laterality: N/A;  . laryngeal polyp  10/2008   Dr. Tami Ribas  . TOP      Social History:  reports that she has been smoking cigarettes. She has been smoking about 1.00 pack per day. She has never used smokeless tobacco. She reports that she does not drink alcohol or use drugs.  Family History:  Family History  Problem Relation Age of Onset  . Stroke Father   . Cancer Sister        breast cancer  . Hypertension Mother   . Hypertension Other        sibling  . Diabetes Other        grandmother     Prior to Admission medications   Medication Sig Start Date End Date Taking? Authorizing Provider  acetaminophen (TYLENOL) 325 MG tablet Take 2 tablets (650 mg total) by mouth every 6 (six) hours as needed for mild pain (or Fever >/= 101). 06/09/18   Gouru, Illene Silver, MD  albuterol (PROAIR HFA) 108 (90 Base) MCG/ACT inhaler INHALE 2 PUFFS INTO THE LUNGS EVERY 6 HOURS AS NEEDED FOR WHEEZING OR SHORTNESS OF BREATH 09/12/19   Venia Carbon, MD  clonazePAM (KLONOPIN) 0.5 MG tablet Take 1-2 tablets (0.5-1 mg total) by mouth at bedtime  as needed. For restless legs 01/04/20   Venia Carbon, MD  feeding supplement, ENSURE ENLIVE, (ENSURE ENLIVE) LIQD Take 237 mLs by mouth 2 (two) times daily between meals. 06/09/18   Nicholes Mango, MD  ferrous sulfate 325 (65 FE) MG tablet Take 1 tablet (325 mg total) by mouth 2 (two) times daily. 06/09/18   Nicholes Mango, MD  lisinopril-hydrochlorothiazide (ZESTORETIC) 20-12.5 MG tablet Take 2 tablets by mouth daily. 11/04/19   Venia Carbon, MD  metFORMIN (GLUCOPHAGE-XR) 500 MG 24 hr tablet TAKE 1 TABLET(500 MG) BY MOUTH DAILY WITH BREAKFAST 12/31/18   Viviana Simpler I, MD  omeprazole (PRILOSEC) 40 MG capsule TAKE 1 CAPSULE(40 MG) BY MOUTH TWICE DAILY BEFORE A MEAL 08/21/17   Venia Carbon, MD  ONE TOUCH ULTRA TEST test strip TEST ONCE DAILY AS DIRECTED 01/26/18   Venia Carbon, MD  simvastatin (ZOCOR) 80 MG tablet TAKE 1 TABLET(80 MG) BY MOUTH  DAILY 10/23/19   Venia Carbon, MD  sucralfate (CARAFATE) 1 g tablet TAKE 1 TABLET BY MOUTH BEFORE MEALS AND EVERY NIGHT AT BEDTIME 06/29/18   Venia Carbon, MD  vitamin B-12 (CYANOCOBALAMIN) 1000 MCG tablet Take 1,000 mcg by mouth daily.    [provider]    Physical Exam: Vitals:   01/05/20 1544 01/05/20 1545 01/05/20 1600 01/05/20 1610  BP: (!) 127/52 (!) 127/52 (!) 119/51 (!) 119/51  Pulse: 93 96 89 83  Resp: 20 (!) 23  18  Temp: 97.7 F (36.5 C)   (!) 97.5 F (36.4 C)  TempSrc: Oral   Oral  SpO2: 100% 100% 100% 100%   General: Not in acute distress.  Pale looking HEENT:       Eyes: PERRL, EOMI, no scleral icterus.       ENT: No discharge from the ears and nose, no pharynx injection, no tonsillar enlargement.        Neck: No JVD, no bruit, no mass felt. Heme: No neck lymph node enlargement. Cardiac: S1/S2, RRR, No murmurs, No gallops or rubs. Respiratory: No rales, wheezing, rhonchi or rubs. GI: Soft, nondistended, nontender, no rebound pain, no organomegaly, BS present. GU: No hematuria Ext: No pitting leg edema bilaterally. 2+DP/PT pulse bilaterally. Musculoskeletal: No joint deformities, No joint redness or warmth, no limitation of ROM in spin. Skin: No rashes.  Neuro: Alert, oriented X3, cranial nerves II-XII grossly intact, moves all extremities normally. Psych: Patient is not psychotic, no suicidal or hemocidal ideation.  Labs on Admission: I have personally reviewed following labs and imaging studies  CBC: Recent Labs  Lab 01/05/20 0843  WBC 4.4  NEUTROABS 3.2  HGB 4.4*  HCT 15.9*  MCV 89.3  PLT 376   Basic Metabolic Panel: Recent Labs  Lab 01/05/20 0843  NA 137  K 4.6  CL 107  CO2 17*  GLUCOSE 258*  BUN 60*  CREATININE 1.49*  CALCIUM 9.1   GFR: CrCl cannot be calculated (Unknown ideal weight.). Liver Function Tests: Recent Labs  Lab 01/05/20 0843  AST 20  ALT 10  ALKPHOS 29*  BILITOT 0.5  PROT 5.6*  ALBUMIN 3.0*   No  results for input(s): LIPASE, AMYLASE in the last 168 hours. No results for input(s): AMMONIA in the last 168 hours. Coagulation Profile: Recent Labs  Lab 01/05/20 0944  INR 1.1   Cardiac Enzymes: No results for input(s): CKTOTAL, CKMB, CKMBINDEX, TROPONINI in the last 168 hours. BNP (last 3 results) No results for input(s): PROBNP in the last 8760 hours.  HbA1C: No results for input(s): HGBA1C in the last 72 hours. CBG: No results for input(s): GLUCAP in the last 168 hours. Lipid Profile: No results for input(s): CHOL, HDL, LDLCALC, TRIG, CHOLHDL, LDLDIRECT in the last 72 hours. Thyroid Function Tests: No results for input(s): TSH, T4TOTAL, FREET4, T3FREE, THYROIDAB in the last 72 hours. Anemia Panel: No results for input(s): VITAMINB12, FOLATE, FERRITIN, TIBC, IRON, RETICCTPCT in the last 72 hours. Urine analysis:    Component Value Date/Time   COLORURINE STRAW (A) 01/05/2020 0944   APPEARANCEUR CLEAR (A) 01/05/2020 0944   LABSPEC 1.033 (H) 01/05/2020 0944   PHURINE 5.0 01/05/2020 0944   GLUCOSEU NEGATIVE 01/05/2020 0944   HGBUR NEGATIVE 01/05/2020 0944   BILIRUBINUR NEGATIVE 01/05/2020 0944   KETONESUR NEGATIVE 01/05/2020 0944   PROTEINUR NEGATIVE 01/05/2020 0944   NITRITE NEGATIVE 01/05/2020 0944   LEUKOCYTESUR NEGATIVE 01/05/2020 0944   Sepsis Labs: @LABRCNTIP (procalcitonin:4,lacticidven:4) ) Recent Results (from the past 240 hour(s))  Respiratory Panel by RT PCR (Flu A&B, Covid) - Nasopharyngeal Swab     Status: None   Collection Time: 01/05/20  8:43 AM   Specimen: Nasopharyngeal Swab  Result Value Ref Range Status   SARS Coronavirus 2 by RT PCR NEGATIVE NEGATIVE Final    Comment: (NOTE) SARS-CoV-2 target nucleic acids are NOT DETECTED. The SARS-CoV-2 RNA is generally detectable in upper respiratoy specimens during the acute phase of infection. The lowest concentration of SARS-CoV-2 viral copies this assay can detect is 131 copies/mL. A negative result does not  preclude SARS-Cov-2 infection and should not be used as the sole basis for treatment or other patient management decisions. A negative result may occur with  improper specimen collection/handling, submission of specimen other than nasopharyngeal swab, presence of viral mutation(s) within the areas targeted by this assay, and inadequate number of viral copies (<131 copies/mL). A negative result must be combined with clinical observations, patient history, and epidemiological information. The expected result is Negative. Fact Sheet for Patients:  PinkCheek.be Fact Sheet for Healthcare Providers:  GravelBags.it This test is not yet ap proved or cleared by the [place] FDA and  has been authorized for detection and/or diagnosis of SARS-CoV-2 by FDA under an Emergency Use Authorization (EUA). This EUA will remain  in effect (meaning this test can be used) for the duration of the COVID-19 declaration under Section 564(b)(1) of the Act, 21 U.S.C. section 360bbb-3(b)(1), unless the authorization is terminated or revoked sooner.    Influenza A by PCR NEGATIVE NEGATIVE Final   Influenza B by PCR NEGATIVE NEGATIVE Final    Comment: (NOTE) The Xpert Xpress SARS-CoV-2/FLU/RSV assay is intended as an aid in  the diagnosis of influenza from Nasopharyngeal swab specimens and  should not be used as a sole basis for treatment. Nasal washings and  aspirates are unacceptable for Xpert Xpress SARS-CoV-2/FLU/RSV  testing. Fact Sheet for Patients: PinkCheek.be Fact Sheet for Healthcare Providers: GravelBags.it This test is not yet approved or cleared by the [place] FDA and  has been authorized for detection and/or diagnosis of SARS-CoV-2 by  FDA under an Emergency Use Authorization (EUA). This EUA will remain  in effect (meaning this test can be used) for the duration of the    Covid-19 declaration under Section 564(b)(1) of the Act, 21  U.S.C. section 360bbb-3(b)(1), unless the authorization is  terminated or revoked. Performed at [facility], [address], [place], [place] [address]      Radiological Exams on Admission: CT Head Wo Contrast  Result Date:  01/05/2020 CLINICAL DATA:  Pt to ED via [facility] from home for dizziness. Pt got her second COVID vaccine on Friday and has not felt well since. Per EMS dtr reports episodes of syncope yesterday. Pt is A & O x 4 per EMS. CBG 324, BP 89/52, HR 110. Pt is in NAD. EXAM: CT HEAD WITHOUT CONTRAST CT CERVICAL SPINE WITHOUT CONTRAST TECHNIQUE: Multidetector CT imaging of the head and cervical spine was performed following the standard protocol without intravenous contrast. Multiplanar CT image reconstructions of the cervical spine were also generated. COMPARISON:  None. FINDINGS: CT HEAD FINDINGS Brain: No evidence of acute infarction, hemorrhage, hydrocephalus, extra-axial collection or mass lesion/mass effect. Vascular: No hyperdense vessel or unexpected calcification. Skull: Normal. Negative for fracture or focal lesion. Sinuses/Orbits: Globes and orbits are unremarkable. Visualized sinuses are clear. Other: None. CT CERVICAL SPINE FINDINGS Alignment: Straightened cervical lordosis. No spondylolisthesis/subluxation. Skull base and vertebrae: No acute fracture. No primary bone lesion or focal pathologic process. Soft tissues and spinal canal: No prevertebral fluid or swelling. No visible canal hematoma. Prominent carotid artery calcification on the left. Disc levels: Mild loss of disc height at C 4-C5 and C5-C6 with mild disc bulging and uncovertebral spurring. No convincing disc herniation. Mild neural foraminal narrowing on the left at C4-C5 and C5-C6 and on the right at C5-C6 due to uncovertebral spurring. Upper chest: No acute findings.  Emphysema noted at the lung apices. Other: None. IMPRESSION: HEAD CT 1. Normal  unenhanced CT scan of the brain for age. CERVICAL CT 1. No fracture or acute finding. 2. Lung apices show significant centrilobular emphysema. 3. Prominent calcification of the left carotid artery at the bifurcation. Electronically Signed   By: Lajean Manes M.D.   On: 01/05/2020 09:24   CT Cervical Spine Wo Contrast  Result Date: 01/05/2020 CLINICAL DATA:  Pt to ED via [facility] from home for dizziness. Pt got her second COVID vaccine on Friday and has not felt well since. Per EMS dtr reports episodes of syncope yesterday. Pt is A & O x 4 per EMS. CBG 324, BP 89/52, HR 110. Pt is in NAD. EXAM: CT HEAD WITHOUT CONTRAST CT CERVICAL SPINE WITHOUT CONTRAST TECHNIQUE: Multidetector CT imaging of the head and cervical spine was performed following the standard protocol without intravenous contrast. Multiplanar CT image reconstructions of the cervical spine were also generated. COMPARISON:  None. FINDINGS: CT HEAD FINDINGS Brain: No evidence of acute infarction, hemorrhage, hydrocephalus, extra-axial collection or mass lesion/mass effect. Vascular: No hyperdense vessel or unexpected calcification. Skull: Normal. Negative for fracture or focal lesion. Sinuses/Orbits: Globes and orbits are unremarkable. Visualized sinuses are clear. Other: None. CT CERVICAL SPINE FINDINGS Alignment: Straightened cervical lordosis. No spondylolisthesis/subluxation. Skull base and vertebrae: No acute fracture. No primary bone lesion or focal pathologic process. Soft tissues and spinal canal: No prevertebral fluid or swelling. No visible canal hematoma. Prominent carotid artery calcification on the left. Disc levels: Mild loss of disc height at C 4-C5 and C5-C6 with mild disc bulging and uncovertebral spurring. No convincing disc herniation. Mild neural foraminal narrowing on the left at C4-C5 and C5-C6 and on the right at C5-C6 due to uncovertebral spurring. Upper chest: No acute findings.  Emphysema noted at the lung apices. Other: None.  IMPRESSION: HEAD CT 1. Normal unenhanced CT scan of the brain for age. CERVICAL CT 1. No fracture or acute finding. 2. Lung apices show significant centrilobular emphysema. 3. Prominent calcification of the left carotid artery at the bifurcation. Electronically Signed  By: Lajean Manes M.D.   On: 01/05/2020 09:24   CT ABDOMEN PELVIS W CONTRAST  Result Date: 01/05/2020 CLINICAL DATA:  Abdominal trauma.  Rule out retroperitoneal bleed. Patient received her second COVID-19 vaccine 2 days ago. Patient has not felt well since. Syncopal episodes yesterday. EXAM: CT ABDOMEN AND PELVIS WITH CONTRAST TECHNIQUE: Multidetector CT imaging of the abdomen and pelvis was performed using the standard protocol following bolus administration of intravenous contrast. CONTRAST:  58mL OMNIPAQUE IOHEXOL 300 MG/ML  SOLN COMPARISON:  None. FINDINGS: Lower chest: No acute abnormality. Hepatobiliary: Normal liver. Small dependent gallstones. No gallbladder wall thickening or inflammation. No bile duct dilation. Pancreas: Unremarkable. No pancreatic ductal dilatation or surrounding inflammatory changes. Spleen: Normal in size without focal abnormality. Adrenals/Urinary Tract: No adrenal masses. Significant right renal atrophy. Decreased enhancement of the right kidney consistent with chronic vascular disease. Left kidney normal in size with small normal enhancement and excretion. Subcentimeter low-density cortical lesion, anterolateral midpole and medial lower pole, too small to characterize but consistent with cysts. No other masses, no stones and no hydronephrosis. Normal ureters. Normal bladder. Stomach/Bowel: Rectum moderately distended with stool. No evidence of bowel obstruction. No bowel wall thickening or inflammation. No evidence of bowel injury. No mesenteric hematoma. Normal appendix visualized. Normal stomach. Vascular/Lymphatic: Aortic atherosclerosis. No aneurysm. No vascular injury. No enlarged lymph nodes. Reproductive:  Status post hysterectomy. No adnexal masses. Other: No ascites. No peritoneal or retroperitoneal hemorrhage. No hernia. Musculoskeletal: No fracture or acute finding. No osteoblastic or osteolytic lesions. IMPRESSION: 1. No acute findings. No evidence of injury to the abdomen or pelvis. Specifically, no intraperitoneal or retroperitoneal hemorrhage. 2. Right renal atrophy and decreased right renal enhancement consistent with chronic right renal artery vascular disease. 3. Aortic atherosclerosis. Electronically Signed   By: Lajean Manes M.D.   On: 01/05/2020 10:09   DG Chest Portable 1 View  Result Date: 01/05/2020 CLINICAL DATA:  Shortness of breath EXAM: PORTABLE CHEST 1 VIEW COMPARISON:  12/17/2018 FINDINGS: Lungs are clear.  No pleural effusion or pneumothorax. The heart is normal in size.  Thoracic aortic atherosclerosis. IMPRESSION: No evidence of acute cardiopulmonary disease. Electronically Signed   By: Julian Hy M.D.   On: 01/05/2020 09:10     EKG: Independently reviewed.  Sinus rhythm, QTC 449, early R wave progression, ST depression in V4-V6.   Assessment/Plan Principal Problem:   Symptomatic anemia Active Problems:   Type II diabetes mellitus with renal manifestations (HCC)   HTN (hypertension)   HLD (hyperlipidemia)   GERD (gastroesophageal reflux disease)   Anxiety   Acute renal failure superimposed on stage 3a chronic kidney disease (HCC)   Tobacco abuse   Syncope   GIB (gastrointestinal bleeding)   Elevated troponin   Symptomatic anemia due to GIB: Hgb dropped drom 9.7 to 4.4. positive FOBT. Pt has hx of GIB and AVM. Dr. Allen Norris of GI is consutled.  - will admit to SDU as inpt - transfuse 3 units of blood now - GI consulted by Ed, will follow up recommendations - NPO for possible EGD - IVF: 1L NS bolus - Start IV pantoprazole gtt - Zofran IV for nausea - Avoid NSAIDs and SQ heparin - Maintain IV access (2 large bore IVs if possible). - Monitor closely and  follow q6h cbc, transfuse as necessary, if Hgb<7.0 - LaB: INR, PTT and type screen -Continue iron supplement  Type II diabetes mellitus with renal manifestations ([place]): Most recent A1c 5.9, well controled. Patient is taking Metformin at home -SSI  HTN: Blood pressures are soft -hold lisinopril-HCTZ due to softer blood pressure and severe anemia -Monitor blood pressure closely  HLD (hyperlipidemia) -Lipitor  GERD (gastroesophageal reflux disease) -On IV Protonix  Anxiety -Continue home Klonopin  Acute renal failure superimposed on stage 3a chronic kidney disease ([place]): Baseline creatinine 1.0-1.2.  Her creatinine is 1.49, BUN 60.  Most likely due to prerenal failure. -Hold lisinopril-HCTZ -IV fluid as above and blood transfusion -Renal function by BMP  Tobacco abuse -Nicotine patch  Syncope: Likely due to hypovolemic status secondary to GI bleeding.  CT head is negative.  Patient does not have focal neuro deficit on physical examination. -Frequent neuro check -IV fluid and blood transfusion as above -Check orthostatic status  Elevated troponin: Troponin 196, 245.  No chest pain.  Most likely due to demand ischemia -Trend troponin -Check A1c, FLP -repeat EKG in morning -No aspirin due to GI bleeding -Lipitor   Inpatient status:  # Patient requires inpatient status due to high intensity of service, high risk for further deterioration and high frequency of surveillance required.  I certify that at the point of admission it is my clinical judgment that the patient will require inpatient hospital care spanning beyond 2 midnights from the point of admission.  . This patient has multiple chronic comorbidities including GI bleeding, AVM, hypertension, hyperlipidemia, diabetes mellitus, GERD, RLS, iron deficiency anemia, tobacco abuse, CKD-3 . Now patient has presenting with symptomatic anemia due to GI bleeding.  Also has syncope, fall, worsening renal function, elevated  troponin . The worrisome physical exam findings include pale looking . The initial radiographic and laboratory data are worrisome because of severe anemia with hemoglobin dropped from 9.7 -->4.4.  Elevated troponin . Current medical needs: please see my assessment and plan . Predictability of an adverse outcome (risk): Patient has multiple comorbidities as listed above. Now presents with symptomatic anemia due to GI bleeding.  Also has syncope, fall, worsening renal function, elevated troponin.  Patient's presentation is highly complicated.  Patient is at high risk of deteriorating.  Will need to be treated in hospital for at least 2 days.           DVT ppx: SCD Code Status: Full code Family Communication: not done, no family member is at bed side.              Yes, patient's    at bed side Disposition Plan:  Anticipate discharge back to previous home environment Consults called:  Dr. Allen Norris Admission status:  SDU/inpation       Date of Service 01/05/2020    [facility]   If 7PM-7AM, please contact night-coverage [email or web address] 01/05/2020, 5:00 PM

## 2020-01-05 NOTE — ED Triage Notes (Signed)
Pt to ED via [facility] from home for dizziness. Pt got her second COVID vaccine on Friday and has not felt well since. Per EMS dtr reports episodes of syncope yesterday. Pt is A & O x 4 per EMS. CBG 324, BP 89/52, HR 110. Pt is in NAD.

## 2020-01-05 NOTE — ED Provider Notes (Signed)
[facility] Emergency Department Provider Note  ____________________________________________   First MD Initiated Contact with Patient 01/05/20 0840     (approximate)  I have reviewed the triage vital signs and the nursing notes.   HISTORY  Chief Complaint Dizziness    HPI Kathryn Ware is a 79 y.o. female with history of anemia, prior GI bleed, hypertension, hyperlipidemia, diabetes who comes in for weakness.  Patient had her second Covid vaccine on Friday.  Since then she has not been doing well.  Patient had multiple episodes of syncope where she does report losing consciousness yesterday.  She is unsure if she hit her head but does have a little bit of neck tenderness.  She denies any other abdominal, chest, extremity tenderness.  Patient states that she is not been eating or drinking well just due to not feeling well.  Her sugar with EMS was 324 and patient was noted to be hypotensive 89/52 and had a heart rate of 110.  Patient endorses is a little bit of shortness of breath as well but denies any chest pain.          Past Medical History:  Diagnosis Date  . Anemia   . ARF (acute renal failure) ([place])   . Blood transfusion without reported diagnosis   . Diabetes mellitus type II   . GI (gastrointestinal bleed)   . GI AVM (gastrointestinal arteriovenous vascular malformation)    stomach  . History of echocardiogram 12/2013   a. 12/2013: EF 60-65%, DD, mild LVH, nl RV size & systolic function, mild MR/TR, mild AS, nl RVSP; b. TTE 04/2017: EF 60-65%, normal wall motion, GR1DD, mild MR, left atrium normal in size, normal RV systolic function, PASP normal   . History of nuclear stress test    a. 12/2013: low risk, no sig WMA, nondiag EKG 2/2 baseline LVH w/ repol abnl, no sig ischemia, EF 70% no artifact  . HLD (hyperlipidemia)   . HTN (hypertension)   . Hypercholesterolemia   . IDA (iron deficiency anemia)   . Polyp of colon, adenomatous   . RLS  (restless legs syndrome)     Patient Active Problem List   Diagnosis Date Noted  . Peripheral arterial disease ([place]) 09/12/2019  . Chronic bronchitis ([place]) 12/20/2018  . Aortic atherosclerosis ([place]) 12/19/2017  . Chronic diastolic CHF (congestive heart failure) ([place]) 05/31/2017  . Malnutrition of mild degree ([place]) 03/30/2017  . Anemia 07/05/2016  . Restless legs syndrome (RLS) 02/23/2016  . 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 Anemia due to gastrointestinal blood loss 02/18/2015  . Advance directive discussed with patient 02/18/2015  . Nicotine dependence 10/29/2013  . Routine general medical examination at a health care facility 08/17/2011  . Mild cognitive impairment 08/17/2011  . Type 2 diabetes mellitus with other circulatory complications ([place]) 35/00/9381  . HYPERCHOLESTEROLEMIA 02/02/2007  . Essential hypertension 02/02/2007  . Angiodysplasia of intestinal tract 02/02/2007    Past Surgical History:  Procedure Laterality Date  . ABDOMINAL ADHESION SURGERY  11/2001   Dr. Tamala Julian  . ABDOMINAL HYSTERECTOMY  1976   RSO (fibroid)  . COLONOSCOPY WITH PROPOFOL N/A 07/07/2016   Procedure: COLONOSCOPY WITH PROPOFOL;  Surgeon: Manya Silvas, MD;  Location: [facility] ENDOSCOPY;  Service: Endoscopy;  Laterality: N/A;  . COLONOSCOPY WITH PROPOFOL N/A 12/14/2016   Procedure: COLONOSCOPY WITH PROPOFOL;  Surgeon: Manya Silvas, MD;  Location: [facility] ENDOSCOPY;  Service: Endoscopy;  Laterality: N/A;  . DOBUTAMINE STRESS ECHO  11/10   normal  .  DOPPLER ECHOCARDIOGRAPHY     EF 55%, mild MR, TR  . ENTEROSCOPY N/A 06/08/2018   Procedure: ENTEROSCOPY;  Surgeon: Lin Landsman, MD;  Location: [facility] ENDOSCOPY;  Service: Gastroenterology;  Laterality: N/A;  . ESOPHAGOGASTRODUODENOSCOPY  07/2006   multiple angioectasias  . ESOPHAGOGASTRODUODENOSCOPY Left 03/12/2015   Procedure: place tube in throat and evaluate stomach and duodenum for source of bleeding. Cauterize if concern for  rebleeding.;  Surgeon: Hulen Luster, MD;  Location: [facility] ENDOSCOPY;  Service: Endoscopy;  Laterality: Left;  . ESOPHAGOGASTRODUODENOSCOPY N/A 12/14/2016   Procedure: ESOPHAGOGASTRODUODENOSCOPY (EGD);  Surgeon: Manya Silvas, MD;  Location[facility] ENDOSCOPY;  Service: Endoscopy;  Laterality: N/A;  . ESOPHAGOGASTRODUODENOSCOPY N/A 07/19/2017   Procedure: ESOPHAGOGASTRODUODENOSCOPY (EGD);  Surgeon: Lin Landsman, MD;  Location: [facility] ENDOSCOPY;  Service: Gastroenterology;  Laterality: N/A;  . ESOPHAGOGASTRODUODENOSCOPY (EGD) WITH PROPOFOL N/A 11/23/2015   Procedure: ESOPHAGOGASTRODUODENOSCOPY (EGD) WITH PROPOFOL;  Surgeon: Hulen Luster, MD;  Location: [facility] ENDOSCOPY;  Service: Gastroenterology;  Laterality: N/A;  . ESOPHAGOGASTRODUODENOSCOPY (EGD) WITH PROPOFOL N/A 07/07/2016   Procedure: ESOPHAGOGASTRODUODENOSCOPY (EGD) WITH PROPOFOL;  Surgeon: Manya Silvas, MD;  Location: [facility] ENDOSCOPY;  Service: Endoscopy;  Laterality: N/A;  . ESOPHAGOGASTRODUODENOSCOPY (EGD) WITH PROPOFOL N/A 05/19/2017   Procedure: ESOPHAGOGASTRODUODENOSCOPY (EGD) WITH PROPOFOL;  Surgeon: Manya Silvas, MD;  Location: [facility] ENDOSCOPY;  Service: Endoscopy;  Laterality: N/A;  . laryngeal polyp  10/2008   Dr. Tami Ribas  . TOP      Prior to Admission medications   Medication Sig Start Date End Date Taking? Authorizing Provider  acetaminophen (TYLENOL) 325 MG tablet Take 2 tablets (650 mg total) by mouth every 6 (six) hours as needed for mild pain (or Fever >/= 101). 06/09/18   Gouru, Illene Silver, MD  albuterol (PROAIR HFA) 108 (90 Base) MCG/ACT inhaler INHALE 2 PUFFS INTO THE LUNGS EVERY 6 HOURS AS NEEDED FOR WHEEZING OR SHORTNESS OF BREATH 09/12/19   Venia Carbon, MD  clonazePAM (KLONOPIN) 0.5 MG tablet Take 1-2 tablets (0.5-1 mg total) by mouth at bedtime as needed. For restless legs 01/04/20   Venia Carbon, MD  feeding supplement, ENSURE ENLIVE, (ENSURE ENLIVE) LIQD Take 237 mLs by mouth 2 (two) times daily between meals.  06/09/18   Nicholes Mango, MD  ferrous sulfate 325 (65 FE) MG tablet Take 1 tablet (325 mg total) by mouth 2 (two) times daily. 06/09/18   Nicholes Mango, MD  lisinopril-hydrochlorothiazide (ZESTORETIC) 20-12.5 MG tablet Take 2 tablets by mouth daily. 11/04/19   Venia Carbon, MD  metFORMIN (GLUCOPHAGE-XR) 500 MG 24 hr tablet TAKE 1 TABLET(500 MG) BY MOUTH DAILY WITH BREAKFAST 12/31/18   Viviana Simpler I, MD  omeprazole (PRILOSEC) 40 MG capsule TAKE 1 CAPSULE(40 MG) BY MOUTH TWICE DAILY BEFORE A MEAL 08/21/17   Venia Carbon, MD  ONE TOUCH ULTRA TEST test strip TEST ONCE DAILY AS DIRECTED 01/26/18   Venia Carbon, MD  simvastatin (ZOCOR) 80 MG tablet TAKE 1 TABLET(80 MG) BY MOUTH DAILY 10/23/19   Venia Carbon, MD  sucralfate (CARAFATE) 1 g tablet TAKE 1 TABLET BY MOUTH BEFORE MEALS AND EVERY NIGHT AT BEDTIME 06/29/18   Venia Carbon, MD  vitamin B-12 (CYANOCOBALAMIN) 1000 MCG tablet Take 1,000 mcg by mouth daily.    [provider]    Allergies Nifedipine and Metformin  Family History  Problem Relation Age of Onset  . Stroke Father   . Cancer Sister        breast cancer  . Hypertension Mother   .  Hypertension Other        sibling  . Diabetes Other        grandmother    Social History Social History   Tobacco Use  . Smoking status: Current Every Day Smoker    Packs/day: 1.00    Types: Cigarettes  . Smokeless tobacco: Never Used  Substance Use Topics  . Alcohol use: No  . Drug use: No      Review of Systems Constitutional: No fever/chills, positive weakness, dizziness, syncope Eyes: No visual changes. ENT: No sore throat. Cardiovascular: Denies chest pain. Respiratory: Mild shortness of breath Gastrointestinal: No abdominal pain.  No nausea, no vomiting.  No diarrhea.  No constipation.  Decreased appetite Genitourinary: Negative for dysuria. Musculoskeletal: Negative for back pain. Skin: Negative for rash. Neurological: Negative for headaches, focal  weakness or numbness. All other ROS negative ____________________________________________   PHYSICAL EXAM:  VITAL SIGNS: ED Triage Vitals  Enc Vitals Group     BP 01/05/20 0838 (!) 132/111     Pulse Rate 01/05/20 0838 (!) 116     Resp 01/05/20 0838 19     Temp --      Temp src --      SpO2 01/05/20 0838 100 %     Weight --      Height --      Head Circumference --      Peak Flow --      Pain Score 01/05/20 0839 0     Pain Loc --      Pain Edu? --      Excl. in [place]? --     Constitutional: Alert and oriented. Well appearing and in no acute distress. Eyes: Conjunctivae are normal. EOMI. Head: Atraumatic. Nose: No congestion/rhinnorhea. Mouth/Throat: Mucous membranes are moist.   Neck: No stridor. Trachea Midline. FROM Cardiovascular: Tachycardic, regular rhythm. Grossly normal heart sounds.  Good peripheral circulation.  No chest wall tenderness Respiratory: Normal respiratory effort.  No retractions. Lungs CTAB. Gastrointestinal: Soft and nontender. No distention. No abdominal bruits.  Musculoskeletal: No lower extremity tenderness nor edema.  No joint effusions.  Moves all extremities well without any tenderness noted. Neurologic:  Normal speech and language. No gross focal neurologic deficits are appreciated.  Equal strength in arms and legs Skin:  Skin is warm, dry and intact. No rash noted. Psychiatric: Mood and affect are normal. Speech and behavior are normal. GU: Deferred   ____________________________________________   LABS (all labs ordered are listed, but only abnormal results are displayed)  Labs Reviewed  CBC WITH DIFFERENTIAL/PLATELET - Abnormal; Notable for the following components:      Result Value   RBC 1.78 (*)    Hemoglobin 4.4 (*)    HCT 15.9 (*)    MCH 24.7 (*)    MCHC 27.7 (*)    RDW 18.3 (*)    All other components within normal limits  COMPREHENSIVE METABOLIC PANEL - Abnormal; Notable for the following components:   CO2 17 (*)    Glucose,  Bld 258 (*)    BUN 60 (*)    Creatinine, Ser 1.49 (*)    Total Protein 5.6 (*)    Albumin 3.0 (*)    Alkaline Phosphatase 29 (*)    GFR calc non Af Amer 34 (*)    GFR calc Af Amer 39 (*)    All other components within normal limits  TROPONIN I (HIGH SENSITIVITY) - Abnormal; Notable for the following components:   Troponin I (High Sensitivity) 196 (*)  All other components within normal limits  RESPIRATORY PANEL BY RT PCR (FLU A&B, COVID)  URINALYSIS, ROUTINE W REFLEX MICROSCOPIC  PROTIME-INR  APTT  TYPE AND SCREEN  PREPARE RBC (CROSSMATCH)   ____________________________________________   ED ECG REPORT I, Vanessa Dunklin, the attending physician, personally viewed and interpreted this ECG.  EKG is sinus tachycardia rate of 114, no ST elevation, no T wave inversions, normal intervals ____________________________________________  RADIOLOGY Robert Bellow, personally viewed and evaluated these images (plain radiographs) as part of my medical decision making, as well as reviewing the written report by the radiologist.  ED MD interpretation:  No PNA   Official radiology report(s): DG Chest Portable 1 View  Result Date: 01/05/2020 CLINICAL DATA:  Shortness of breath EXAM: PORTABLE CHEST 1 VIEW COMPARISON:  12/17/2018 FINDINGS: Lungs are clear.  No pleural effusion or pneumothorax. The heart is normal in size.  Thoracic aortic atherosclerosis. IMPRESSION: No evidence of acute cardiopulmonary disease. Electronically Signed   By: Julian Hy M.D.   On: 01/05/2020 09:10    ____________________________________________   PROCEDURES  Procedure(s) performed (including Critical Care):  .Critical Care Performed by: Vanessa Patrick, MD Authorized by: Vanessa [place], MD   Critical care provider statement:    Critical care time (minutes):  45   Critical care was necessary to treat or prevent imminent or life-threatening deterioration of the following conditions:  Circulatory  failure   Critical care was time spent personally by me on the following activities:  Discussions with consultants, evaluation of patient's response to treatment, examination of patient, ordering and performing treatments and interventions, ordering and review of laboratory studies, ordering and review of radiographic studies, pulse oximetry, re-evaluation of patient's condition, obtaining history from patient or surrogate and review of old charts  .1-3 Lead EKG Interpretation Performed by: Vanessa Dem[place], MD Authorized by: Vanessa [place], MD     Interpretation: abnormal     ECG rate:  110-118   ECG rate assessment: tachycardic     Rhythm: sinus tachycardia     Ectopy: none     Conduction: normal       ____________________________________________   INITIAL IMPRESSION / ASSESSMENT AND PLAN / ED COURSE  Kathryn Ware was evaluated in Emergency Department on 01/05/2020 for the symptoms described in the history of present illness. She was evaluated in the context of the global COVID-19 pandemic, which necessitated consideration that the patient might be at risk for infection with the SARS-CoV-2 virus that causes COVID-19. Institutional protocols and algorithms that pertain to the evaluation of patients at risk for COVID-19 are in a state of rapid change based on information released by regulatory bodies including the CDC and federal and state organizations. These policies and algorithms were followed during the patient's care in the ED.    Patient is a 79 year old who comes in tachycardic and initially hypotensive with EMS with elevated blood sugars.  Given patient's report of not eating or drinking we will give some fluids due to concern for possible dehydration.  Will get labs to evaluate for electrolyte abnormalities, AKI, DKA.  Given the recurrent syncopal episodes will get EKG and cardiac markers.  Will get CT head to evaluate for intracranial hemorrhage and CT cervical to evaluate for cervical  fracture.  Will get chest x-ray to evaluate for pneumonia.  Will keep patient on the cardiac monitor given patient is tachycardic with concerns for syncope.  Patient sugar is elevated but normal anion gap therefore unlikely DKA.  Given patient's recurrent falls patient noted to have a hemoglobin of 4.4.  Will get CT abdomen pelvis just to make sure is no evidence of retroperitoneal bleed given that her rectal exam does not show melena.  However does show Hemoccult positive but she has had a six-point hemoglobin drop over a month.  Patient has a history of a small bowel AVM.  Will start on Protonix just to be safe.  No liver dysfunction or alcohol use to suggest variceal bleeding.  Will transfuse 3 units of blood and need to discuss with the hospital team for admission after CT scans  Patient CT scans are negative for acute processes.  I did let Dr. Allen Norris from GI know about patient being here.  Patient not been hypotensive but has been tachycardic but is alert and oriented x3.  Patient is waiting blood.  Will discuss with the hospital team for admission.   ____________________________________________   FINAL CLINICAL IMPRESSION(S) / ED DIAGNOSES   Final diagnoses:  Symptomatic anemia  Gastrointestinal hemorrhage, unspecified gastrointestinal hemorrhage type  Tachycardia      MEDICATIONS GIVEN DURING THIS VISIT:  Medications  0.9 %  sodium chloride infusion (has no administration in time range)  pantoprazole (PROTONIX) 80 mg in sodium chloride 0.9 % 100 mL IVPB (80 mg Intravenous New Bag/Given 01/05/20 1012)  pantoprazole (PROTONIX) 80 mg in sodium chloride 0.9 % 100 mL (0.8 mg/mL) infusion (has no administration in time range)  pantoprazole (PROTONIX) injection 40 mg (has no administration in time range)  sodium chloride 0.9 % bolus 1,000 mL (1,000 mLs Intravenous New Bag/Given 01/05/20 0849)  iohexol (OMNIPAQUE) 300 MG/ML solution 75 mL (75 mLs Intravenous Contrast Given 01/05/20 0948)      ED Discharge Orders    None       Note:  This document was prepared using Dragon voice recognition software and may include unintentional dictation errors.   Vanessa , MD 01/05/20 1032

## 2020-01-05 NOTE — ED Notes (Signed)
Pt assisted to bedside bathroom agaIN without incident.  Updated on plan of care and verbalized understanding.  2nd unit of prbc obtained from blood bank, will adminster per orders.  Will continue to monitor/.reasses

## 2020-01-05 NOTE — ED Notes (Signed)
2nd unit of PRBC completed at this time.  No adverse reactions noted to product given.  Pt resting in POC, no acute changes or s/s of distress noted. Pt states" I am feel much better than this morning" denies the "Groggy" feeling she presented with.  VSS will continue to monitor/reassess.

## 2020-01-05 NOTE — ED Notes (Signed)
Pt assisted to and from restroom without incident. Pt able to stand on her own and ambulate with light assistance.  Pt did have exertional tachypnea, however, resolved when resting in bed.  No other changes or

## 2020-01-06 ENCOUNTER — Encounter
Admission: EM | Disposition: A | Discharge status: Home / Self Care | Payer: Self-pay | Source: Home / Self Care | Attending: Internal Medicine

## 2020-01-06 ENCOUNTER — Inpatient Hospital Stay: Payer: Medicare HMO | Admitting: Anesthesiology

## 2020-01-06 DIAGNOSIS — D500 Iron deficiency anemia secondary to blood loss (chronic): Secondary | ICD-10-CM

## 2020-01-06 DIAGNOSIS — K31819 Angiodysplasia of stomach and duodenum without bleeding: Secondary | ICD-10-CM

## 2020-01-06 DIAGNOSIS — K5520 Angiodysplasia of colon without hemorrhage: Secondary | ICD-10-CM

## 2020-01-06 HISTORY — PX: ENTEROSCOPY: SHX5533

## 2020-01-06 LAB — CBC
HCT: 25 % — ABNORMAL LOW (ref 36.0–46.0)
HCT: 30.8 % — ABNORMAL LOW (ref 36.0–46.0)
HCT: 32.2 % — ABNORMAL LOW (ref 36.0–46.0)
HCT: 31.2 % — ABNORMAL LOW (ref 36.0–46.0)
Hemoglobin: 8.2 g/dL — ABNORMAL LOW (ref 12.0–15.0)
Hemoglobin: 10.4 g/dL — ABNORMAL LOW (ref 12.0–15.0)
Hemoglobin: 10.6 g/dL — ABNORMAL LOW (ref 12.0–15.0)
Hemoglobin: 10.2 g/dL — ABNORMAL LOW (ref 12.0–15.0)
MCH: 26.9 pg (ref 26.0–34.0)
MCH: 28.2 pg (ref 26.0–34.0)
MCH: 27.5 pg (ref 26.0–34.0)
MCH: 27.7 pg (ref 26.0–34.0)
MCHC: 32.8 g/dL (ref 30.0–36.0)
MCHC: 33.8 g/dL (ref 30.0–36.0)
MCHC: 32.9 g/dL (ref 30.0–36.0)
MCHC: 32.7 g/dL (ref 30.0–36.0)
MCV: 82 fL (ref 80.0–100.0)
MCV: 83.5 fL (ref 80.0–100.0)
MCV: 83.4 fL (ref 80.0–100.0)
MCV: 84.8 fL (ref 80.0–100.0)
Platelets: 197 10*3/uL (ref 150–400)
Platelets: 177 10*3/uL (ref 150–400)
Platelets: 167 10*3/uL (ref 150–400)
Platelets: 164 10*3/uL (ref 150–400)
RBC: 3.05 MIL/uL — ABNORMAL LOW (ref 3.87–5.11)
RBC: 3.69 MIL/uL — ABNORMAL LOW (ref 3.87–5.11)
RBC: 3.86 MIL/uL — ABNORMAL LOW (ref 3.87–5.11)
RBC: 3.68 MIL/uL — ABNORMAL LOW (ref 3.87–5.11)
RDW: 15.6 % — ABNORMAL HIGH (ref 11.5–15.5)
RDW: 16.1 % — ABNORMAL HIGH (ref 11.5–15.5)
RDW: 16.5 % — ABNORMAL HIGH (ref 11.5–15.5)
RDW: 16.5 % — ABNORMAL HIGH (ref 11.5–15.5)
WBC: 8.3 10*3/uL (ref 4.0–10.5)
WBC: 7.1 10*3/uL (ref 4.0–10.5)
WBC: 5.9 10*3/uL (ref 4.0–10.5)
WBC: 7.1 10*3/uL (ref 4.0–10.5)
nRBC: 0 % (ref 0.0–0.2)
nRBC: 0 % (ref 0.0–0.2)
nRBC: 0 % (ref 0.0–0.2)
nRBC: 0 % (ref 0.0–0.2)

## 2020-01-06 LAB — BASIC METABOLIC PANEL
Anion gap: 8 (ref 5–15)
BUN: 40 mg/dL — ABNORMAL HIGH (ref 8–23)
CO2: 21 mmol/L — ABNORMAL LOW (ref 22–32)
Calcium: 8.9 mg/dL (ref 8.9–10.3)
Chloride: 114 mmol/L — ABNORMAL HIGH (ref 98–111)
Creatinine, Ser: 1.15 mg/dL — ABNORMAL HIGH (ref 0.44–1.00)
GFR calc Af Amer: 53 mL/min — ABNORMAL LOW (ref >60)
GFR calc non Af Amer: 46 mL/min — ABNORMAL LOW (ref >60)
Glucose, Bld: 82 mg/dL (ref 70–99)
Potassium: 3.8 mmol/L (ref 3.5–5.1)
Sodium: 143 mmol/L (ref 135–145)

## 2020-01-06 LAB — GLUCOSE, CAPILLARY
Glucose-Capillary: 81 mg/dL (ref 70–99)
Glucose-Capillary: 112 mg/dL — ABNORMAL HIGH (ref 70–99)
Glucose-Capillary: 112 mg/dL — ABNORMAL HIGH (ref 70–99)
Glucose-Capillary: 126 mg/dL — ABNORMAL HIGH (ref 70–99)
Glucose-Capillary: 186 mg/dL — ABNORMAL HIGH (ref 70–99)

## 2020-01-06 LAB — BPAM RBC
Blood Product Expiration Date: 202103282359
Blood Product Expiration Date: 202104032359
Blood Product Expiration Date: 202104032359
ISSUE DATE / TIME: 202103141116
ISSUE DATE / TIME: 202103141526
ISSUE DATE / TIME: 202103142307
Unit Type and Rh: 7300
Unit Type and Rh: 7300
Unit Type and Rh: 7300

## 2020-01-06 LAB — TYPE AND SCREEN
ABO/RH(D): B POS
Antibody Screen: NEGATIVE
Unit division: 0
Unit division: 0
Unit division: 0

## 2020-01-06 LAB — LIPID PANEL
Cholesterol: 98 mg/dL (ref 0–200)
HDL: 27 mg/dL — ABNORMAL LOW (ref >40)
LDL Cholesterol: 50 mg/dL (ref 0–99)
Total CHOL/HDL Ratio: 3.6 RATIO
Triglycerides: 107 mg/dL (ref <150)
VLDL: 21 mg/dL (ref 0–40)

## 2020-01-06 LAB — TROPONIN I (HIGH SENSITIVITY)
Troponin I (High Sensitivity): 752 ng/L (ref <18)
Troponin I (High Sensitivity): 566 ng/L (ref <18)
Troponin I (High Sensitivity): 556 ng/L (ref <18)

## 2020-01-06 LAB — HEMOGLOBIN A1C
Hgb A1c MFr Bld: 5 % (ref 4.8–5.6)
Mean Plasma Glucose: 96.8 mg/dL

## 2020-01-06 SURGERY — ENTEROSCOPY
Anesthesia: General

## 2020-01-06 MED ORDER — SODIUM CHLORIDE 0.9 % IV SOLN: INTRAVENOUS | Status: DC | PRN

## 2020-01-06 MED ORDER — DEXTROSE-NACL 5-0.9 % IV SOLN: INTRAVENOUS | Status: DC

## 2020-01-06 MED ORDER — PROPOFOL 500 MG/50ML IV EMUL
INTRAVENOUS | Status: DC | PRN
  Administered 2020-01-06: 150 ug/kg/min via INTRAVENOUS

## 2020-01-06 MED ORDER — PROPOFOL 10 MG/ML IV BOLUS
INTRAVENOUS | Status: DC | PRN
  Administered 2020-01-06: 70 mg via INTRAVENOUS
  Administered 2020-01-06: 10 mg via INTRAVENOUS

## 2020-01-06 MED ORDER — LIDOCAINE HCL (CARDIAC) PF 100 MG/5ML IV SOSY
PREFILLED_SYRINGE | INTRAVENOUS | Status: DC | PRN
  Administered 2020-01-06: 50 mg via INTRAVENOUS

## 2020-01-06 MED ORDER — SODIUM CHLORIDE 0.9 % IV SOLN
300.0000 mg | Freq: Once | INTRAVENOUS | Status: AC
  Administered 2020-01-06: 300 mg via INTRAVENOUS
  Filled 2020-01-06: qty 15

## 2020-01-06 NOTE — Progress Notes (Signed)
Doctor informed of critical troponin level of 752. On 3/14 at 2303 Troponin level was 938. EKG was done last night. No new orders at this time.

## 2020-01-06 NOTE — Progress Notes (Signed)
PROGRESS NOTE    [name]  MEQ:[id number] DOB: [birth year] DOA: 01/05/2020 PCP: Venia Carbon, MD   Brief Narrative:  Kathryn Ware is a 79 y.o. female with medical history significant of GI bleeding, AVM, hypertension, hyperlipidemia, diabetes mellitus, GERD, RLS, iron deficiency anemia, tobacco abuse, CKD-3, who presents with dizziness, shortness of breath, generalized weakness, syncope and fall. Found to have hemoglobin of 4.4 with elevated troponin.  No obvious bleeding.  Has an history of bleeding AVMs in the past.  GI was consulted and she was going for post endoscopy today.  Subjective: Has no new complaints today denies any chest pain or shortness of breath.  Has not noted any obvious bleeding.  Assessment & Plan:   Principal Problem:   Symptomatic anemia Active Problems:   Type II diabetes mellitus with renal manifestations (HCC)   HTN (hypertension)   HLD (hyperlipidemia)   GERD (gastroesophageal reflux disease)   Anxiety   Acute renal failure superimposed on stage 3a chronic kidney disease (HCC)   Tobacco abuse   Syncope   GIB (gastrointestinal bleeding)   Elevated troponin  Symptomatic anemia due to GIB/syncope: Hgb dropped drom 9.7 to 4.4. positive FOBT. Pt has hx of GIB and AVM.  GI was consulted and she is going for post endoscopy today. Syncope most likely secondary to severe anemia.  No obvious bleeding.  No neurologic deficit.  EKG without any acute change. -She was given 3 unit of packed RBCs.  Hemoglobin improved to 10.4. -Monitor hemoglobin. -Continue IV Protonix. -Transfuse for hemoglobin less than 7. -Continue iron supplement.  Type II diabetes mellitus with renal manifestations ([place]):  Controlled with A1c of 5.  Patient was on Metformin at home. -New with SSI.  Hypertension.  Blood pressure within goal.  It was initially soft and her home dose of lisinopril-HCTZ was held. -Continue to monitor-we will restart home medications if needed.  Acute  renal failure superimposed on stage 3a chronic kidney disease ([place]): Baseline creatinine 1.0-1.2.  Creatinine peaked at 1.49 with BUN of 60 most likely secondary to GI bleed.  Improved to 1.15 today after getting transfusions. -Continue to monitor. -Avoid nephrotoxins.  Elevaded troponin.  Troponin peaked at 938, now trending down.  No chest pain.  Most likely secondary to demand due to severe anemia.  No significant EKG changes. -Continue to trend. -Continue Lipitor.  HLD (hyperlipidemia) -Lipitor  GERD (gastroesophageal reflux disease) -On IV Protonix  Anxiety -Continue home Klonopin  Tobacco abuse -Nicotine patch.   Objective: Vitals:   01/06/20 0207 01/06/20 0654 01/06/20 1208 01/06/20 1331  BP: 120/60 118/61 (!) 119/40 (!) 144/55  Pulse: 98 83 75 84  Resp: 18 20  18   Temp: 97.9 F (36.6 C) 99.2 F (37.3 C) 98.2 F (36.8 C) 98.1 F (36.7 C)  TempSrc: Oral Oral Oral Temporal  SpO2: 100% 97% 99% 100%  Weight:      Height:    5\' 4"  (1.626 m)    Intake/Output Summary (Last 24 hours) at 01/06/2020 1421 Last data filed at 01/06/2020 0614 Gross per 24 hour  Intake 827.14 ml  Output --  Net 827.14 ml   Filed Weights   01/06/20 0047  Weight: 50.3 kg    Examination:  General exam: Appears calm and comfortable  Respiratory system: Clear to auscultation. Respiratory effort normal. Cardiovascular system: S1 & S2 heard, RRR. No JVD, murmurs, rubs, gallops or clicks. Gastrointestinal system: Soft, nontender, nondistended, bowel sounds positive. Central nervous system: Alert and oriented. No focal  neurological deficits.Symmetric 5 x 5 power. Extremities: No edema, no cyanosis, pulses intact and symmetrical. Skin: No rashes, lesions or ulcers Psychiatry: Judgement and insight appear normal. Mood & affect appropriate.    DVT prophylaxis: SCDs Code Status: Full Family Communication: Updated the patient. Disposition Plan: Pending improvement and GI finding.  Most  likely will go back home.  Consultants:   GI  Procedures:  Antimicrobials:   Data Reviewed: I have personally reviewed following labs and imaging studies  CBC: Recent Labs  Lab 01/05/20 0843 01/05/20 2303 01/06/20 0710  WBC 4.4 8.3 7.1  NEUTROABS 3.2  --   --   HGB 4.4* 8.2* 10.4*  HCT 15.9* 25.0* 30.8*  MCV 89.3 82.0 83.5  PLT 287 197 956   Basic Metabolic Panel: Recent Labs  Lab 01/05/20 0843 01/06/20 0710  NA 137 143  K 4.6 3.8  CL 107 114*  CO2 17* 21*  GLUCOSE 258* 82  BUN 60* 40*  CREATININE 1.49* 1.15*  CALCIUM 9.1 8.9   GFR: Estimated Creatinine Clearance: 32.5 mL/min (A) (by C-G formula based on SCr of 1.15 mg/dL (H)). Liver Function Tests: Recent Labs  Lab 01/05/20 0843  AST 20  ALT 10  ALKPHOS 29*  BILITOT 0.5  PROT 5.6*  ALBUMIN 3.0*   No results for input(s): LIPASE, AMYLASE in the last 168 hours. No results for input(s): AMMONIA in the last 168 hours. Coagulation Profile: Recent Labs  Lab 01/05/20 0944  INR 1.1   Cardiac Enzymes: No results for input(s): CKTOTAL, CKMB, CKMBINDEX, TROPONINI in the last 168 hours. BNP (last 3 results) No results for input(s): PROBNP in the last 8760 hours. HbA1C: Recent Labs    01/06/20 0710  HGBA1C 5.0   CBG: Recent Labs  Lab 01/05/20 2138 01/06/20 0738 01/06/20 1207  GLUCAP 98 81 112*   Lipid Profile: Recent Labs    01/06/20 0710  CHOL 98  HDL 27*  LDLCALC 50  TRIG 107  CHOLHDL 3.6   Thyroid Function Tests: No results for input(s): TSH, T4TOTAL, FREET4, T3FREE, THYROIDAB in the last 72 hours. Anemia Panel: No results for input(s): VITAMINB12, FOLATE, FERRITIN, TIBC, IRON, RETICCTPCT in the last 72 hours. Sepsis Labs: No results for input(s): PROCALCITON, LATICACIDVEN in the last 168 hours.  Recent Results (from the past 240 hour(s))  Respiratory Panel by RT PCR (Flu A&B, Covid) - Nasopharyngeal Swab     Status: None   Collection Time: 01/05/20  8:43 AM   Specimen:  Nasopharyngeal Swab  Result Value Ref Range Status   SARS Coronavirus 2 by RT PCR NEGATIVE NEGATIVE Final    Comment: (NOTE) SARS-CoV-2 target nucleic acids are NOT DETECTED. The SARS-CoV-2 RNA is generally detectable in upper respiratoy specimens during the acute phase of infection. The lowest concentration of SARS-CoV-2 viral copies this assay can detect is 131 copies/mL. A negative result does not preclude SARS-Cov-2 infection and should not be used as the sole basis for treatment or other patient management decisions. A negative result may occur with  improper specimen collection/handling, submission of specimen other than nasopharyngeal swab, presence of viral mutation(s) within the areas targeted by this assay, and inadequate number of viral copies (<131 copies/mL). A negative result must be combined with clinical observations, patient history, and epidemiological information. The expected result is Negative. Fact Sheet for Patients:  PinkCheek.be Fact Sheet for Healthcare Providers:  GravelBags.it This test is not yet ap proved or cleared by the [place] FDA and  has been authorized for detection  and/or diagnosis of SARS-CoV-2 by FDA under an Emergency Use Authorization (EUA). This EUA will remain  in effect (meaning this test can be used) for the duration of the COVID-19 declaration under Section 564(b)(1) of the Act, 21 U.S.C. section 360bbb-3(b)(1), unless the authorization is terminated or revoked sooner.    Influenza A by PCR NEGATIVE NEGATIVE Final   Influenza B by PCR NEGATIVE NEGATIVE Final    Comment: (NOTE) The Xpert Xpress SARS-CoV-2/FLU/RSV assay is intended as an aid in  the diagnosis of influenza from Nasopharyngeal swab specimens and  should not be used as a sole basis for treatment. Nasal washings and  aspirates are unacceptable for Xpert Xpress SARS-CoV-2/FLU/RSV  testing. Fact Sheet for  Patients: PinkCheek.be Fact Sheet for Healthcare Providers: GravelBags.it This test is not yet approved or cleared by the [place] FDA and  has been authorized for detection and/or diagnosis of SARS-CoV-2 by  FDA under an Emergency Use Authorization (EUA). This EUA will remain  in effect (meaning this test can be used) for the duration of the  Covid-19 declaration under Section 564(b)(1) of the Act, 21  U.S.C. section 360bbb-3(b)(1), unless the authorization is  terminated or revoked. Performed at [facility], [address], [place], [place] [address]      Radiology Studies: CT Head Wo Contrast  Result Date: 01/05/2020 CLINICAL DATA:  Pt to ED via [facility] from home for dizziness. Pt got her second COVID vaccine on Friday and has not felt well since. Per EMS dtr reports episodes of syncope yesterday. Pt is A & O x 4 per EMS. CBG 324, BP 89/52, HR 110. Pt is in NAD. EXAM: CT HEAD WITHOUT CONTRAST CT CERVICAL SPINE WITHOUT CONTRAST TECHNIQUE: Multidetector CT imaging of the head and cervical spine was performed following the standard protocol without intravenous contrast. Multiplanar CT image reconstructions of the cervical spine were also generated. COMPARISON:  None. FINDINGS: CT HEAD FINDINGS Brain: No evidence of acute infarction, hemorrhage, hydrocephalus, extra-axial collection or mass lesion/mass effect. Vascular: No hyperdense vessel or unexpected calcification. Skull: Normal. Negative for fracture or focal lesion. Sinuses/Orbits: Globes and orbits are unremarkable. Visualized sinuses are clear. Other: None. CT CERVICAL SPINE FINDINGS Alignment: Straightened cervical lordosis. No spondylolisthesis/subluxation. Skull base and vertebrae: No acute fracture. No primary bone lesion or focal pathologic process. Soft tissues and spinal canal: No prevertebral fluid or swelling. No visible canal hematoma. Prominent carotid artery  calcification on the left. Disc levels: Mild loss of disc height at C 4-C5 and C5-C6 with mild disc bulging and uncovertebral spurring. No convincing disc herniation. Mild neural foraminal narrowing on the left at C4-C5 and C5-C6 and on the right at C5-C6 due to uncovertebral spurring. Upper chest: No acute findings.  Emphysema noted at the lung apices. Other: None. IMPRESSION: HEAD CT 1. Normal unenhanced CT scan of the brain for age. CERVICAL CT 1. No fracture or acute finding. 2. Lung apices show significant centrilobular emphysema. 3. Prominent calcification of the left carotid artery at the bifurcation. Electronically Signed   By: Lajean Manes M.D.   On: 01/05/2020 09:24   CT Cervical Spine Wo Contrast  Result Date: 01/05/2020 CLINICAL DATA:  Pt to ED via [facility] from home for dizziness. Pt got her second COVID vaccine on Friday and has not felt well since. Per EMS dtr reports episodes of syncope yesterday. Pt is A & O x 4 per EMS. CBG 324, BP 89/52, HR 110. Pt is in NAD. EXAM: CT HEAD WITHOUT CONTRAST CT  CERVICAL SPINE WITHOUT CONTRAST TECHNIQUE: Multidetector CT imaging of the head and cervical spine was performed following the standard protocol without intravenous contrast. Multiplanar CT image reconstructions of the cervical spine were also generated. COMPARISON:  None. FINDINGS: CT HEAD FINDINGS Brain: No evidence of acute infarction, hemorrhage, hydrocephalus, extra-axial collection or mass lesion/mass effect. Vascular: No hyperdense vessel or unexpected calcification. Skull: Normal. Negative for fracture or focal lesion. Sinuses/Orbits: Globes and orbits are unremarkable. Visualized sinuses are clear. Other: None. CT CERVICAL SPINE FINDINGS Alignment: Straightened cervical lordosis. No spondylolisthesis/subluxation. Skull base and vertebrae: No acute fracture. No primary bone lesion or focal pathologic process. Soft tissues and spinal canal: No prevertebral fluid or swelling. No visible canal  hematoma. Prominent carotid artery calcification on the left. Disc levels: Mild loss of disc height at C 4-C5 and C5-C6 with mild disc bulging and uncovertebral spurring. No convincing disc herniation. Mild neural foraminal narrowing on the left at C4-C5 and C5-C6 and on the right at C5-C6 due to uncovertebral spurring. Upper chest: No acute findings.  Emphysema noted at the lung apices. Other: None. IMPRESSION: HEAD CT 1. Normal unenhanced CT scan of the brain for age. CERVICAL CT 1. No fracture or acute finding. 2. Lung apices show significant centrilobular emphysema. 3. Prominent calcification of the left carotid artery at the bifurcation. Electronically Signed   By: Lajean Manes M.D.   On: 01/05/2020 09:24   CT ABDOMEN PELVIS W CONTRAST  Result Date: 01/05/2020 CLINICAL DATA:  Abdominal trauma.  Rule out retroperitoneal bleed. Patient received her second COVID-19 vaccine 2 days ago. Patient has not felt well since. Syncopal episodes yesterday. EXAM: CT ABDOMEN AND PELVIS WITH CONTRAST TECHNIQUE: Multidetector CT imaging of the abdomen and pelvis was performed using the standard protocol following bolus administration of intravenous contrast. CONTRAST:  26mL OMNIPAQUE IOHEXOL 300 MG/ML  SOLN COMPARISON:  None. FINDINGS: Lower chest: No acute abnormality. Hepatobiliary: Normal liver. Small dependent gallstones. No gallbladder wall thickening or inflammation. No bile duct dilation. Pancreas: Unremarkable. No pancreatic ductal dilatation or surrounding inflammatory changes. Spleen: Normal in size without focal abnormality. Adrenals/Urinary Tract: No adrenal masses. Significant right renal atrophy. Decreased enhancement of the right kidney consistent with chronic vascular disease. Left kidney normal in size with small normal enhancement and excretion. Subcentimeter low-density cortical lesion, anterolateral midpole and medial lower pole, too small to characterize but consistent with cysts. No other masses, no  stones and no hydronephrosis. Normal ureters. Normal bladder. Stomach/Bowel: Rectum moderately distended with stool. No evidence of bowel obstruction. No bowel wall thickening or inflammation. No evidence of bowel injury. No mesenteric hematoma. Normal appendix visualized. Normal stomach. Vascular/Lymphatic: Aortic atherosclerosis. No aneurysm. No vascular injury. No enlarged lymph nodes. Reproductive: Status post hysterectomy. No adnexal masses. Other: No ascites. No peritoneal or retroperitoneal hemorrhage. No hernia. Musculoskeletal: No fracture or acute finding. No osteoblastic or osteolytic lesions. IMPRESSION: 1. No acute findings. No evidence of injury to the abdomen or pelvis. Specifically, no intraperitoneal or retroperitoneal hemorrhage. 2. Right renal atrophy and decreased right renal enhancement consistent with chronic right renal artery vascular disease. 3. Aortic atherosclerosis. Electronically Signed   By: Lajean Manes M.D.   On: 01/05/2020 10:09   DG Chest Portable 1 View  Result Date: 01/05/2020 CLINICAL DATA:  Shortness of breath EXAM: PORTABLE CHEST 1 VIEW COMPARISON:  12/17/2018 FINDINGS: Lungs are clear.  No pleural effusion or pneumothorax. The heart is normal in size.  Thoracic aortic atherosclerosis. IMPRESSION: No evidence of acute cardiopulmonary disease. Electronically Signed  By: Julian Hy M.D.   On: 01/05/2020 09:10    Scheduled Meds: . [MAR Hold] atorvastatin  40 mg Oral q1800  . [MAR Hold] ferrous sulfate  325 mg Oral BID  . [MAR Hold] insulin aspart  0-5 Units Subcutaneous QHS  . [MAR Hold] insulin aspart  0-9 Units Subcutaneous TID WC  . [MAR Hold] nicotine  21 mg Transdermal Daily  . [MAR Hold] pantoprazole  40 mg Intravenous Q12H  . [MAR Hold] vitamin B-12  1,000 mcg Oral Daily   Continuous Infusions: . dextrose 5 % and 0.9% NaCl 75 mL/hr at 01/06/20 0825  . pantoprozole (PROTONIX) infusion 8 mg/hr (01/06/20 0614)     LOS: 1 day   Time spent: 40  minutes.  Lorella Nimrod, MD [facility]  If 7PM-7AM, please contact night-coverage [email or web address]  01/06/2020, 2:21 PM   This record has been created using Systems analyst. Errors have been sought and corrected,but may not always be located. Such creation errors do not reflect on the standard of care.

## 2020-01-06 NOTE — Anesthesia Preprocedure Evaluation (Signed)
Anesthesia Evaluation  Patient identified by MRN, date of birth, ID band Patient awake    Reviewed: Allergy & Precautions, NPO status , Patient's Chart, lab work & pertinent test results  History of Anesthesia Complications Negative for: history of anesthetic complications  Airway Mallampati: II  TM Distance: >3 FB Neck ROM: Full    Dental  (+) Edentulous Upper, Edentulous Lower   Pulmonary neg sleep apnea, neg COPD, Current Smoker and Patient abstained from smoking.,    breath sounds clear to auscultation- rhonchi (-) wheezing      Cardiovascular hypertension, Pt. on medications + Peripheral Vascular Disease and +CHF (preserved EF)  (-) CAD, (-) Past MI, (-) Cardiac Stents and (-) CABG  Rhythm:Regular Rate:Normal - Systolic murmurs and - Diastolic murmurs    Neuro/Psych neg Seizures Anxiety negative neurological ROS     GI/Hepatic Neg liver ROS, GERD  ,  Endo/Other  diabetes, Oral Hypoglycemic Agents  Renal/GU Renal InsufficiencyRenal disease     Musculoskeletal negative musculoskeletal ROS (+)   Abdominal (+) - obese,   Peds  Hematology  (+) anemia ,   Anesthesia Other Findings Past Medical History: No date: Anemia No date: ARF (acute renal failure) (HCC) No date: Blood transfusion without reported diagnosis No date: Diabetes mellitus type II No date: GI (gastrointestinal bleed) No date: GI AVM (gastrointestinal arteriovenous vascular malformation)     Comment:  stomach 12/2013: History of echocardiogram     Comment:  a. 12/2013: EF 60-65%, DD, mild LVH, nl RV size &               systolic function, mild MR/TR, mild AS, nl RVSP; b. TTE               04/2017: EF 60-65%, normal wall motion, GR1DD, mild MR,               left atrium normal in size, normal RV systolic function,               PASP normal  No date: History of nuclear stress test     Comment:  a. 12/2013: low risk, no sig WMA, nondiag EKG 2/2           baseline LVH w/ repol abnl, no sig ischemia, EF 70% no               artifact No date: HLD (hyperlipidemia) No date: HTN (hypertension) No date: Hypercholesterolemia No date: IDA (iron deficiency anemia) No date: Polyp of colon, adenomatous No date: RLS (restless legs syndrome)   Reproductive/Obstetrics                             Anesthesia Physical Anesthesia Plan  ASA: III  Anesthesia Plan: General   Post-op Pain Management:    Induction: Intravenous  PONV Risk Score and Plan: 1 and Propofol infusion  Airway Management Planned: Natural Airway  Additional Equipment:   Intra-op Plan:   Post-operative Plan:   Informed Consent: I have reviewed the patients History and Physical, chart, labs and discussed the procedure including the risks, benefits and alternatives for the proposed anesthesia with the patient or authorized representative who has indicated his/her understanding and acceptance.     Dental advisory given  Plan Discussed with: CRNA and Anesthesiologist  Anesthesia Plan Comments:         Anesthesia Quick Evaluation

## 2020-01-06 NOTE — Anesthesia Procedure Notes (Signed)
Date/Time: 01/06/2020 2:05 PM Performed by: Johnna Acosta, CRNA Pre-anesthesia Checklist: Patient identified, Emergency Drugs available, Suction available, Patient being monitored and Timeout performed Patient Re-evaluated:Patient Re-evaluated prior to induction Oxygen Delivery Method: Nasal cannula Preoxygenation: Pre-oxygenation with 100% oxygen Induction Type: IV induction

## 2020-01-06 NOTE — Anesthesia Postprocedure Evaluation (Signed)
Anesthesia Post Note  Patient: Kathryn Ware  Procedure(s) Performed: ENTEROSCOPY (N/A )  Patient location during evaluation: Endoscopy Anesthesia Type: General Level of consciousness: awake and alert and oriented Pain management: pain level controlled Vital Signs Assessment: post-procedure vital signs reviewed and stable Respiratory status: spontaneous breathing, nonlabored ventilation and respiratory function stable Cardiovascular status: blood pressure returned to baseline and stable Postop Assessment: no signs of nausea or vomiting Anesthetic complications: no     Last Vitals:  Vitals:   01/06/20 1446 01/06/20 1509  BP: (!) 107/38 (!) 125/53  Pulse: 76   Resp: (!) 22   Temp:    SpO2:      Last Pain:  Vitals:   01/06/20 1455  TempSrc:   PainSc: 0-No pain                 Takeysha Bonk

## 2020-01-06 NOTE — Progress Notes (Signed)
0.9 NS IVF hung and piggybacked into D5W 0.9 NS line.

## 2020-01-06 NOTE — Transfer of Care (Signed)
Immediate Anesthesia Transfer of Care Note  Patient: Kathryn Ware  Procedure(s) Performed: ENTEROSCOPY (N/A )  Patient Location: PACU  Anesthesia Type:General  Level of Consciousness: sedated  Airway & Oxygen Therapy: Patient Spontanous Breathing  Post-op Assessment: Report given to RN and Post -op Vital signs reviewed and stable  Post vital signs: Reviewed and stable  Last Vitals:  Vitals Value Taken Time  BP 96/33 01/06/20 1441  Temp    Pulse 77 01/06/20 1441  Resp 16 01/06/20 1441  SpO2 96 % 01/06/20 1441    Last Pain:  Vitals:   01/06/20 1331  TempSrc: Temporal  PainSc: 0-No pain         Complications: No apparent anesthesia complications

## 2020-01-06 NOTE — Op Note (Signed)
[facility] Gastroenterology Patient Name: Kathryn Ware Procedure Date: 01/06/2020 2:00 PM MRN: [id number] Account #: [id number] Date of Birth: February 10, 1941 Admit Type: Outpatient Age: 79 Room: [facility] ENDO ROOM 1 Gender: Female Note Status: Finalized Procedure:             Small bowel enteroscopy Indications:           Iron deficiency anemia secondary to chronic blood                         loss, Arteriovenous malformation in the small intestine Providers:             Lin Landsman MD, MD Medicines:             Monitored Anesthesia Care Complications:         No immediate complications. Estimated blood loss: None. Procedure:             Pre-Anesthesia Assessment:                        - Prior to the procedure, a History and Physical was                         performed, and patient medications and allergies were                         reviewed. The patient is competent. The risks and                         benefits of the procedure and the sedation options and                         risks were discussed with the patient. All questions                         were answered and informed consent was obtained.                         Patient identification and proposed procedure were                         verified by the physician, the nurse, the                         anesthesiologist, the anesthetist and the technician                         in the pre-procedure area in the procedure room in the                         endoscopy suite. Mental Status Examination: alert and                         oriented. Airway Examination: normal oropharyngeal                         airway and neck mobility. Respiratory Examination:                         clear  to auscultation. CV Examination: normal.                         Prophylactic Antibiotics: The patient does not require                         prophylactic antibiotics. Prior Anticoagulants: The                          patient has taken no previous anticoagulant or                         antiplatelet agents. ASA Grade Assessment: III - A                         patient with severe systemic disease. After reviewing                         the risks and benefits, the patient was deemed in                         satisfactory condition to undergo the procedure. The                         anesthesia plan was to use monitored anesthesia care                         (MAC). Immediately prior to administration of                         medications, the patient was re-assessed for adequacy                         to receive sedatives. The heart rate, respiratory                         rate, oxygen saturations, blood pressure, adequacy of                         pulmonary ventilation, and response to care were                         monitored throughout the procedure. The physical                         status of the patient was re-assessed after the                         procedure.                        After obtaining informed consent, the endoscope was                         passed under direct vision. Throughout the procedure,                         the patient's blood pressure, pulse, and oxygen  saturations were monitored continuously. The                         Colonoscope was introduced through the mouth and                         advanced to the mid-jejunum. The small bowel                         enteroscopy was accomplished without difficulty. The                         patient tolerated the procedure well. Findings:      Multiple angiodysplastic lesions with no bleeding were found in the       proximal jejunum. Coagulation for bleeding prevention using argon plasma       was successful.      Multiple angiodysplastic lesions with no bleeding were found in the       third portion of the duodenum and in the fourth portion of the duodenum.       Coagulation for  bleeding prevention using argon plasma was successful.      The esophagus was normal.      The stomach was normal. Impression:            - Multiple non-bleeding angiodysplastic lesions in the                         jejunum. Treated with argon plasma coagulation (APC).                        - Multiple non-bleeding angiodysplastic lesions in the                         duodenum. Treated with argon plasma coagulation (APC).                        - Normal esophagus.                        - Normal stomach.                        - No specimens collected. Recommendation:        - Return patient to hospital ward for possible                         discharge same day.                        - Resume regular diet today.                        - Return to GI clinic in 2 weeks.                        - Follow up with hematology for parenteral iron                        - Recommend trial of octreotide to prevent bleeding  from AVMs Procedure Code(s):     --- Professional ---                        [phone], Small intestinal endoscopy, enteroscopy beyond                         second portion of duodenum, not including ileum; with                         control of bleeding (eg, injection, bipolar cautery,                         unipolar cautery, laser, heater probe, stapler, plasma                         coagulator) Diagnosis Code(s):     --- Professional ---                        K10.31, Angiodysplasia of colon without hemorrhage                        K31.819, Angiodysplasia of stomach and duodenum                         without bleeding                        D50.0, Iron deficiency anemia secondary to blood loss                         (chronic) CPT copyright 2019 [employer]. All rights reserved. The codes documented in this report are preliminary and upon coder review may  be revised to meet current compliance requirements. Dr. Ulyess Mort Lin Landsman MD, MD 01/06/2020 2:39:10 PM This report has been signed electronically. Number of Addenda: 0 Note Initiated On: 01/06/2020 2:00 PM Estimated Blood Loss:  Estimated blood loss: none.      [facility]

## 2020-01-07 ENCOUNTER — Encounter: Payer: Self-pay | Admitting: *Deleted

## 2020-01-07 ENCOUNTER — Inpatient Hospital Stay
Admit: 2020-01-07 | Discharge: 2020-01-07 | Disposition: A | Discharge status: Home / Self Care | Payer: Medicare HMO | Attending: Internal Medicine | Admitting: Internal Medicine

## 2020-01-07 LAB — BASIC METABOLIC PANEL
Anion gap: 9 (ref 5–15)
BUN: 20 mg/dL (ref 8–23)
CO2: 21 mmol/L — ABNORMAL LOW (ref 22–32)
Calcium: 9 mg/dL (ref 8.9–10.3)
Chloride: 113 mmol/L — ABNORMAL HIGH (ref 98–111)
Creatinine, Ser: 1.05 mg/dL — ABNORMAL HIGH (ref 0.44–1.00)
GFR calc Af Amer: 59 mL/min — ABNORMAL LOW (ref >60)
GFR calc non Af Amer: 51 mL/min — ABNORMAL LOW (ref >60)
Glucose, Bld: 116 mg/dL — ABNORMAL HIGH (ref 70–99)
Potassium: 3.9 mmol/L (ref 3.5–5.1)
Sodium: 143 mmol/L (ref 135–145)

## 2020-01-07 LAB — CBC
HCT: 32.3 % — ABNORMAL LOW (ref 36.0–46.0)
Hemoglobin: 10.5 g/dL — ABNORMAL LOW (ref 12.0–15.0)
MCH: 27.6 pg (ref 26.0–34.0)
MCHC: 32.5 g/dL (ref 30.0–36.0)
MCV: 85 fL (ref 80.0–100.0)
Platelets: 173 10*3/uL (ref 150–400)
RBC: 3.8 MIL/uL — ABNORMAL LOW (ref 3.87–5.11)
RDW: 16.4 % — ABNORMAL HIGH (ref 11.5–15.5)
WBC: 7.2 10*3/uL (ref 4.0–10.5)
nRBC: 0.4 % — ABNORMAL HIGH (ref 0.0–0.2)

## 2020-01-07 LAB — GLUCOSE, CAPILLARY
Glucose-Capillary: 112 mg/dL — ABNORMAL HIGH (ref 70–99)
Glucose-Capillary: 172 mg/dL — ABNORMAL HIGH (ref 70–99)

## 2020-01-07 LAB — ECHOCARDIOGRAM COMPLETE
Height: 64 in
Weight: 1774.26 oz

## 2020-01-07 MED ORDER — LISINOPRIL 20 MG PO TABS: 40.0000 mg | ORAL_TABLET | Freq: Once | ORAL | Status: DC

## 2020-01-07 MED ORDER — SODIUM CHLORIDE 0.9 % IV SOLN
510.0000 mg | Freq: Once | INTRAVENOUS | Status: DC
  Filled 2020-01-07: qty 17

## 2020-01-07 NOTE — Progress Notes (Signed)
Kathryn Ware to be D/C'd home per MD order.  Discussed prescriptions and follow up appointments with the patient. Prescriptions given to patient, medication list explained in detail. Pt verbalized understanding.  Allergies as of 01/07/2020       Reactions   Nifedipine    REACTION: cough   Metformin Other (See Comments)   REACTION: diarrhea when dosage is increased, ok when taking one tablet daily, patient aware that she is on metformin when it is listed that she is allergic        Medication List     TAKE these medications    acetaminophen 325 MG tablet Commonly known as: TYLENOL Take 2 tablets (650 mg total) by mouth every 6 (six) hours as needed for mild pain (or Fever >/= 101).   albuterol 108 (90 Base) MCG/ACT inhaler Commonly known as: ProAir HFA INHALE 2 PUFFS INTO THE LUNGS EVERY 6 HOURS AS NEEDED FOR WHEEZING OR SHORTNESS OF BREATH   clonazePAM 0.5 MG tablet Commonly known as: KLONOPIN Take 1-2 tablets (0.5-1 mg total) by mouth at bedtime as needed. For restless legs   ferrous sulfate 325 (65 FE) MG tablet Take 1 tablet (325 mg total) by mouth 2 (two) times daily.   lisinopril-hydrochlorothiazide 20-12.5 MG tablet Commonly known as: ZESTORETIC Take 2 tablets by mouth daily.   metFORMIN 500 MG 24 hr tablet Commonly known as: GLUCOPHAGE-XR TAKE 1 TABLET(500 MG) BY MOUTH DAILY WITH BREAKFAST   omeprazole 40 MG capsule Commonly known as: PRILOSEC TAKE 1 CAPSULE(40 MG) BY MOUTH TWICE DAILY BEFORE A MEAL   simvastatin 80 MG tablet Commonly known as: ZOCOR TAKE 1 TABLET(80 MG) BY MOUTH DAILY   vitamin B-12 1000 MCG tablet Commonly known as: CYANOCOBALAMIN Take 1,000 mcg by mouth daily.        Vitals:   01/07/20 0338 01/07/20 1346  BP: (!) 151/63 (!) 156/66  Pulse: 86 79  Resp: 20   Temp: 98.3 F (36.8 C) 97.9 F (36.6 C)  SpO2: 98% 100%    Skin clean, dry and intact without evidence of skin break down, no evidence of skin tears noted. IV catheter  discontinued intact. Site without signs and symptoms of complications. Dressing and pressure applied. Pt denies pain at this time. No complaints noted.  An After Visit Summary was printed and given to the patient. Patient escorted via [place], and D/C home via private auto.  [place] A Aviendha Azbell

## 2020-01-07 NOTE — Discharge Summary (Signed)
Physician Discharge Summary  Kathryn Ware ZOX:[id number] DOB: Sep 13, 1941 DOA: 01/05/2020  PCP: Venia Carbon, MD  Admit date: 01/05/2020 Discharge date: 01/07/2020  Admitted From: Home Disposition: Home  Recommendations for Outpatient Follow-up:  1. Follow up with PCP in 1-2 weeks 2. Follow-up with cardiology. 3. Follow-up with gastroenterology. 4. Please obtain BMP/CBC in one week 5. Please follow up on the following pending results: Echocardiogram result.  Home Health: No Equipment/Devices: None Discharge Condition: Stable CODE STATUS: Full Diet recommendation: Heart Healthy / Carb Modified   Brief/Interim Summary: Kathryn Ware a 79 y.o.femalewith medical history significant ofGI bleeding, AVM, hypertension, hyperlipidemia, diabetes mellitus, GERD, RLS, iron deficiency anemia, tobacco abuse, CKD-3, who presents with dizziness, shortness of breath, generalized weakness, syncope and fall. Found to have hemoglobin of 4.4 with elevated troponin.  Patient has an history of prior AVM bleed.  Denies any obvious bleeding.  She received 3 units of packed RBCs and had her EGD with GI which shows multiple AVMs in the proximal jejunum, third portion of duodenum which were cauterized.  Her hemoglobin was 10.5 on discharge.  Also given 1 dose of IV iron.  She will continue with oral iron supplement.  If needed her PCP can arrange for more iron supplement or send her to see an hematologist.  She was also found to have elevated troponin which peaked at 900 and then started dropping.  No chest pain or shortness of breath.  No ST changes on EKG.  Thought to be due to demand secondary to severe anemia.  Echocardiogram was done to rule out any wall motion abnormalities.  Results were pending at the time of discharge as patient was eager to leave.  She was advised to follow-up with her cardiologist for further recommendations and will continue her home medications.  She also has prerenal AKI with  CKD stage IIIa which improved with IV fluid and blood transfusions.  She will continue her home meds for hypertension and diabetes. Her A1c was 5.  Discharge Diagnoses:  Principal Problem:   Symptomatic anemia Active Problems:   Type II diabetes mellitus with renal manifestations (HCC)   HTN (hypertension)   HLD (hyperlipidemia)   GERD (gastroesophageal reflux disease)   Anxiety   Acute renal failure superimposed on stage 3a chronic kidney disease (HCC)   Tobacco abuse   Syncope   GIB (gastrointestinal bleeding)   Elevated troponin  Discharge Instructions  Discharge Instructions    Diet - low sodium heart healthy   Complete by: As directed    Discharge instructions   Complete by: As directed    It was pleasure taking care of you. Please take your medications as directed. Keep yourself well-hydrated. Please follow-up with gastroenterology, cardiology and your primary care physician.  Your primary care physician might need to send you to see a hematologist or arrange for IV iron infusion if needed.   Increase activity slowly   Complete by: As directed      Allergies as of 01/07/2020      Reactions   Nifedipine    REACTION: cough   Metformin Other (See Comments)   REACTION: diarrhea when dosage is increased, ok when taking one tablet daily, patient aware that she is on metformin when it is listed that she is allergic      Medication List    TAKE these medications   acetaminophen 325 MG tablet Commonly known as: TYLENOL Take 2 tablets (650 mg total) by mouth every 6 (six) hours as needed  for mild pain (or Fever >/= 101).   albuterol 108 (90 Base) MCG/ACT inhaler Commonly known as: ProAir HFA INHALE 2 PUFFS INTO THE LUNGS EVERY 6 HOURS AS NEEDED FOR WHEEZING OR SHORTNESS OF BREATH   clonazePAM 0.5 MG tablet Commonly known as: KLONOPIN Take 1-2 tablets (0.5-1 mg total) by mouth at bedtime as needed. For restless legs   ferrous sulfate 325 (65 FE) MG tablet Take 1  tablet (325 mg total) by mouth 2 (two) times daily.   lisinopril-hydrochlorothiazide 20-12.5 MG tablet Commonly known as: ZESTORETIC Take 2 tablets by mouth daily.   metFORMIN 500 MG 24 hr tablet Commonly known as: GLUCOPHAGE-XR TAKE 1 TABLET(500 MG) BY MOUTH DAILY WITH BREAKFAST   omeprazole 40 MG capsule Commonly known as: PRILOSEC TAKE 1 CAPSULE(40 MG) BY MOUTH TWICE DAILY BEFORE A MEAL   simvastatin 80 MG tablet Commonly known as: ZOCOR TAKE 1 TABLET(80 MG) BY MOUTH DAILY   vitamin B-12 1000 MCG tablet Commonly known as: CYANOCOBALAMIN Take 1,000 mcg by mouth daily.      Follow-up Information    Venia Carbon, MD. Go on 01/20/2020.   Specialties: Internal Medicine, Pediatrics Why: 3pm Contact information: [place] [address] [phone]        Lin Landsman, MD. Go on 02/11/2020.   Specialty: Gastroenterology Why: 1:30pm Contact information: [place] [address] [phone]        Wellington Hampshire, MD. Go on 01/09/2020.   Specialty: Cardiology Why: 43m Contact information: [address] [place] [address] [phone]          Allergies  Allergen Reactions  . Nifedipine     REACTION: cough  . Metformin Other (See Comments)    REACTION: diarrhea when dosage is increased, ok when taking one tablet daily, patient aware that she is on metformin when it is listed that she is allergic    Consultations:  GI  Procedures/Studies: CT Head Wo Contrast  Result Date: 01/05/2020 CLINICAL DATA:  Pt to ED via [facility] from home for dizziness. Pt got her second COVID vaccine on Friday and has not felt well since. Per EMS dtr reports episodes of syncope yesterday. Pt is A & O x 4 per EMS. CBG 324, BP 89/52, HR 110. Pt is in NAD. EXAM: CT HEAD WITHOUT CONTRAST CT CERVICAL SPINE WITHOUT CONTRAST TECHNIQUE: Multidetector CT imaging of the head and cervical spine was performed following the  standard protocol without intravenous contrast. Multiplanar CT image reconstructions of the cervical spine were also generated. COMPARISON:  None. FINDINGS: CT HEAD FINDINGS Brain: No evidence of acute infarction, hemorrhage, hydrocephalus, extra-axial collection or mass lesion/mass effect. Vascular: No hyperdense vessel or unexpected calcification. Skull: Normal. Negative for fracture or focal lesion. Sinuses/Orbits: Globes and orbits are unremarkable. Visualized sinuses are clear. Other: None. CT CERVICAL SPINE FINDINGS Alignment: Straightened cervical lordosis. No spondylolisthesis/subluxation. Skull base and vertebrae: No acute fracture. No primary bone lesion or focal pathologic process. Soft tissues and spinal canal: No prevertebral fluid or swelling. No visible canal hematoma. Prominent carotid artery calcification on the left. Disc levels: Mild loss of disc height at C 4-C5 and C5-C6 with mild disc bulging and uncovertebral spurring. No convincing disc herniation. Mild neural foraminal narrowing on the left at C4-C5 and C5-C6 and on the right at C5-C6 due to uncovertebral spurring. Upper chest: No acute findings.  Emphysema noted at the lung apices. Other: None. IMPRESSION: HEAD CT 1. Normal unenhanced  CT scan of the brain for age. CERVICAL CT 1. No fracture or acute finding. 2. Lung apices show significant centrilobular emphysema. 3. Prominent calcification of the left carotid artery at the bifurcation. Electronically Signed   By: Lajean Manes M.D.   On: 01/05/2020 09:24   CT Cervical Spine Wo Contrast  Result Date: 01/05/2020 CLINICAL DATA:  Pt to ED via [facility] from home for dizziness. Pt got her second COVID vaccine on Friday and has not felt well since. Per EMS dtr reports episodes of syncope yesterday. Pt is A & O x 4 per EMS. CBG 324, BP 89/52, HR 110. Pt is in NAD. EXAM: CT HEAD WITHOUT CONTRAST CT CERVICAL SPINE WITHOUT CONTRAST TECHNIQUE: Multidetector CT imaging of the head and cervical spine  was performed following the standard protocol without intravenous contrast. Multiplanar CT image reconstructions of the cervical spine were also generated. COMPARISON:  None. FINDINGS: CT HEAD FINDINGS Brain: No evidence of acute infarction, hemorrhage, hydrocephalus, extra-axial collection or mass lesion/mass effect. Vascular: No hyperdense vessel or unexpected calcification. Skull: Normal. Negative for fracture or focal lesion. Sinuses/Orbits: Globes and orbits are unremarkable. Visualized sinuses are clear. Other: None. CT CERVICAL SPINE FINDINGS Alignment: Straightened cervical lordosis. No spondylolisthesis/subluxation. Skull base and vertebrae: No acute fracture. No primary bone lesion or focal pathologic process. Soft tissues and spinal canal: No prevertebral fluid or swelling. No visible canal hematoma. Prominent carotid artery calcification on the left. Disc levels: Mild loss of disc height at C 4-C5 and C5-C6 with mild disc bulging and uncovertebral spurring. No convincing disc herniation. Mild neural foraminal narrowing on the left at C4-C5 and C5-C6 and on the right at C5-C6 due to uncovertebral spurring. Upper chest: No acute findings.  Emphysema noted at the lung apices. Other: None. IMPRESSION: HEAD CT 1. Normal unenhanced CT scan of the brain for age. CERVICAL CT 1. No fracture or acute finding. 2. Lung apices show significant centrilobular emphysema. 3. Prominent calcification of the left carotid artery at the bifurcation. Electronically Signed   By: Lajean Manes M.D.   On: 01/05/2020 09:24   CT ABDOMEN PELVIS W CONTRAST  Result Date: 01/05/2020 CLINICAL DATA:  Abdominal trauma.  Rule out retroperitoneal bleed. Patient received her second COVID-19 vaccine 2 days ago. Patient has not felt well since. Syncopal episodes yesterday. EXAM: CT ABDOMEN AND PELVIS WITH CONTRAST TECHNIQUE: Multidetector CT imaging of the abdomen and pelvis was performed using the standard protocol following bolus  administration of intravenous contrast. CONTRAST:  46mL OMNIPAQUE IOHEXOL 300 MG/ML  SOLN COMPARISON:  None. FINDINGS: Lower chest: No acute abnormality. Hepatobiliary: Normal liver. Small dependent gallstones. No gallbladder wall thickening or inflammation. No bile duct dilation. Pancreas: Unremarkable. No pancreatic ductal dilatation or surrounding inflammatory changes. Spleen: Normal in size without focal abnormality. Adrenals/Urinary Tract: No adrenal masses. Significant right renal atrophy. Decreased enhancement of the right kidney consistent with chronic vascular disease. Left kidney normal in size with small normal enhancement and excretion. Subcentimeter low-density cortical lesion, anterolateral midpole and medial lower pole, too small to characterize but consistent with cysts. No other masses, no stones and no hydronephrosis. Normal ureters. Normal bladder. Stomach/Bowel: Rectum moderately distended with stool. No evidence of bowel obstruction. No bowel wall thickening or inflammation. No evidence of bowel injury. No mesenteric hematoma. Normal appendix visualized. Normal stomach. Vascular/Lymphatic: Aortic atherosclerosis. No aneurysm. No vascular injury. No enlarged lymph nodes. Reproductive: Status post hysterectomy. No adnexal masses. Other: No ascites. No peritoneal or retroperitoneal hemorrhage. No hernia. Musculoskeletal: No fracture or  acute finding. No osteoblastic or osteolytic lesions. IMPRESSION: 1. No acute findings. No evidence of injury to the abdomen or pelvis. Specifically, no intraperitoneal or retroperitoneal hemorrhage. 2. Right renal atrophy and decreased right renal enhancement consistent with chronic right renal artery vascular disease. 3. Aortic atherosclerosis. Electronically Signed   By: Lajean Manes M.D.   On: 01/05/2020 10:09   DG Chest Portable 1 View  Result Date: 01/05/2020 CLINICAL DATA:  Shortness of breath EXAM: PORTABLE CHEST 1 VIEW COMPARISON:  12/17/2018 FINDINGS:  Lungs are clear.  No pleural effusion or pneumothorax. The heart is normal in size.  Thoracic aortic atherosclerosis. IMPRESSION: No evidence of acute cardiopulmonary disease. Electronically Signed   By: Julian Hy M.D.   On: 01/05/2020 09:10    Subjective: Patient was feeling better when seen today.  No chest pain or shortness of breath.  She was eager to leave.  Discharge Exam: Vitals:   01/07/20 0338 01/07/20 1346  BP: (!) 151/63 (!) 156/66  Pulse: 86 79  Resp: 20   Temp: 98.3 F (36.8 C) 97.9 F (36.6 C)  SpO2: 98% 100%   Vitals:   01/06/20 1633 01/06/20 2010 01/07/20 0338 01/07/20 1346  BP: 139/76 (!) 153/69 (!) 151/63 (!) 156/66  Pulse: 66 71 86 79  Resp:  20 20   Temp: (!) 97.3 F (36.3 C) 98.9 F (37.2 C) 98.3 F (36.8 C) 97.9 F (36.6 C)  TempSrc: Oral Oral Oral Oral  SpO2: 100% 100% 98% 100%  Weight:      Height:        General: Pt is alert, awake, not in acute distress Cardiovascular: RRR, S1/S2 +, no rubs, no gallops Respiratory: CTA bilaterally, no wheezing, no rhonchi Abdominal: Soft, NT, ND, bowel sounds + Extremities: no edema, no cyanosis   The results of significant diagnostics from this hospitalization (including imaging, microbiology, ancillary and laboratory) are listed below for reference.    Microbiology: Recent Results (from the past 240 hour(s))  Respiratory Panel by RT PCR (Flu A&B, Covid) - Nasopharyngeal Swab     Status: None   Collection Time: 01/05/20  8:43 AM   Specimen: Nasopharyngeal Swab  Result Value Ref Range Status   SARS Coronavirus 2 by RT PCR NEGATIVE NEGATIVE Final    Comment: (NOTE) SARS-CoV-2 target nucleic acids are NOT DETECTED. The SARS-CoV-2 RNA is generally detectable in upper respiratoy specimens during the acute phase of infection. The lowest concentration of SARS-CoV-2 viral copies this assay can detect is 131 copies/mL. A negative result does not preclude SARS-Cov-2 infection and should not be used as  the sole basis for treatment or other patient management decisions. A negative result may occur with  improper specimen collection/handling, submission of specimen other than nasopharyngeal swab, presence of viral mutation(s) within the areas targeted by this assay, and inadequate number of viral copies (<131 copies/mL). A negative result must be combined with clinical observations, patient history, and epidemiological information. The expected result is Negative. Fact Sheet for Patients:  PinkCheek.be Fact Sheet for Healthcare Providers:  GravelBags.it This test is not yet ap proved or cleared by the [place] FDA and  has been authorized for detection and/or diagnosis of SARS-CoV-2 by FDA under an Emergency Use Authorization (EUA). This EUA will remain  in effect (meaning this test can be used) for the duration of the COVID-19 declaration under Section 564(b)(1) of the Act, 21 U.S.C. section 360bbb-3(b)(1), unless the authorization is terminated or revoked sooner.    Influenza A by PCR  NEGATIVE NEGATIVE Final   Influenza B by PCR NEGATIVE NEGATIVE Final    Comment: (NOTE) The Xpert Xpress SARS-CoV-2/FLU/RSV assay is intended as an aid in  the diagnosis of influenza from Nasopharyngeal swab specimens and  should not be used as a sole basis for treatment. Nasal washings and  aspirates are unacceptable for Xpert Xpress SARS-CoV-2/FLU/RSV  testing. Fact Sheet for Patients: PinkCheek.be Fact Sheet for Healthcare Providers: GravelBags.it This test is not yet approved or cleared by the [place] FDA and  has been authorized for detection and/or diagnosis of SARS-CoV-2 by  FDA under an Emergency Use Authorization (EUA). This EUA will remain  in effect (meaning this test can be used) for the duration of the  Covid-19 declaration under Section 564(b)(1) of the Act, 21   U.S.C. section 360bbb-3(b)(1), unless the authorization is  terminated or revoked. Performed at [facility], [address] [place], [place] [address]      Labs: BNP (last 3 results) Recent Labs    01/05/20 0843  BNP 614.4*   Basic Metabolic Panel: Recent Labs  Lab 01/05/20 0843 01/06/20 0710 01/07/20 0626  NA 137 143 143  K 4.6 3.8 3.9  CL 107 114* 113*  CO2 17* 21* 21*  GLUCOSE 258* 82 116*  BUN 60* 40* 20  CREATININE 1.49* 1.15* 1.05*  CALCIUM 9.1 8.9 9.0   Liver Function Tests: Recent Labs  Lab 01/05/20 0843  AST 20  ALT 10  ALKPHOS 29*  BILITOT 0.5  PROT 5.6*  ALBUMIN 3.0*   No results for input(s): LIPASE, AMYLASE in the last 168 hours. No results for input(s): AMMONIA in the last 168 hours. CBC: Recent Labs  Lab 01/05/20 0843 01/05/20 0843 01/05/20 2303 01/06/20 0710 01/06/20 1724 01/06/20 2314 01/07/20 0626  WBC 4.4   < > 8.3 7.1 5.9 7.1 7.2  NEUTROABS 3.2  --   --   --   --   --   --   HGB 4.4*   < > 8.2* 10.4* 10.6* 10.2* 10.5*  HCT 15.9*   < > 25.0* 30.8* 32.2* 31.2* 32.3*  MCV 89.3   < > 82.0 83.5 83.4 84.8 85.0  PLT 287   < > 197 177 167 164 173   < > = values in this interval not displayed.   Cardiac Enzymes: No results for input(s): CKTOTAL, CKMB, CKMBINDEX, TROPONINI in the last 168 hours. BNP: Invalid input(s): POCBNP CBG: Recent Labs  Lab 01/06/20 1333 01/06/20 1631 01/06/20 2151 01/07/20 0741 01/07/20 1127  GLUCAP 112* 126* 186* 112* 172*   D-Dimer No results for input(s): DDIMER in the last 72 hours. Hgb A1c Recent Labs    01/06/20 0710  HGBA1C 5.0   Lipid Profile Recent Labs    01/06/20 0710  CHOL 98  HDL 27*  LDLCALC 50  TRIG 107  CHOLHDL 3.6   Thyroid function studies No results for input(s): TSH, T4TOTAL, T3FREE, THYROIDAB in the last 72 hours.  Invalid input(s): FREET3 Anemia work up No results for input(s): VITAMINB12, FOLATE, FERRITIN, TIBC, IRON, RETICCTPCT in the last 72  hours. Urinalysis    Component Value Date/Time   COLORURINE STRAW (A) 01/05/2020 0944   APPEARANCEUR CLEAR (A) 01/05/2020 0944   LABSPEC 1.033 (H) 01/05/2020 0944   PHURINE 5.0 01/05/2020 [place] 01/05/2020 0944   HGBUR NEGATIVE 01/05/2020 0944   BILIRUBINUR NEGATIVE 01/05/2020 0944   KETONESUR NEGATIVE 01/05/2020 0944   PROTEINUR NEGATIVE 01/05/2020 0944   NITRITE NEGATIVE  01/05/2020 [place] 01/05/2020 0944   Sepsis Labs Invalid input(s): PROCALCITONIN,  WBC,  LACTICIDVEN Microbiology Recent Results (from the past 240 hour(s))  Respiratory Panel by RT PCR (Flu A&B, Covid) - Nasopharyngeal Swab     Status: None   Collection Time: 01/05/20  8:43 AM   Specimen: Nasopharyngeal Swab  Result Value Ref Range Status   SARS Coronavirus 2 by RT PCR NEGATIVE NEGATIVE Final    Comment: (NOTE) SARS-CoV-2 target nucleic acids are NOT DETECTED. The SARS-CoV-2 RNA is generally detectable in upper respiratoy specimens during the acute phase of infection. The lowest concentration of SARS-CoV-2 viral copies this assay can detect is 131 copies/mL. A negative result does not preclude SARS-Cov-2 infection and should not be used as the sole basis for treatment or other patient management decisions. A negative result may occur with  improper specimen collection/handling, submission of specimen other than nasopharyngeal swab, presence of viral mutation(s) within the areas targeted by this assay, and inadequate number of viral copies (<131 copies/mL). A negative result must be combined with clinical observations, patient history, and epidemiological information. The expected result is Negative. Fact Sheet for Patients:  PinkCheek.be Fact Sheet for Healthcare Providers:  GravelBags.it This test is not yet ap proved or cleared by the [place] FDA and  has been authorized for detection and/or diagnosis  of SARS-CoV-2 by FDA under an Emergency Use Authorization (EUA). This EUA will remain  in effect (meaning this test can be used) for the duration of the COVID-19 declaration under Section 564(b)(1) of the Act, 21 U.S.C. section 360bbb-3(b)(1), unless the authorization is terminated or revoked sooner.    Influenza A by PCR NEGATIVE NEGATIVE Final   Influenza B by PCR NEGATIVE NEGATIVE Final    Comment: (NOTE) The Xpert Xpress SARS-CoV-2/FLU/RSV assay is intended as an aid in  the diagnosis of influenza from Nasopharyngeal swab specimens and  should not be used as a sole basis for treatment. Nasal washings and  aspirates are unacceptable for Xpert Xpress SARS-CoV-2/FLU/RSV  testing. Fact Sheet for Patients: PinkCheek.be Fact Sheet for Healthcare Providers: GravelBags.it This test is not yet approved or cleared by the [place] FDA and  has been authorized for detection and/or diagnosis of SARS-CoV-2 by  FDA under an Emergency Use Authorization (EUA). This EUA will remain  in effect (meaning this test can be used) for the duration of the  Covid-19 declaration under Section 564(b)(1) of the Act, 21  U.S.C. section 360bbb-3(b)(1), unless the authorization is  terminated or revoked. Performed at [facility], [address] [place], [place] [address]     Time coordinating discharge: Over 30 minutes  SIGNED:  Lorella Nimrod, MD  [facility] 01/07/2020, 3:25 PM  If 7PM-7AM, please contact night-coverage [email or web address]  This record has been created using Systems analyst. Errors have been sought and corrected,but may not always be located. Such creation errors do not reflect on the standard of care.

## 2020-01-07 NOTE — Progress Notes (Signed)
*  PRELIMINARY RESULTS* Echocardiogram 2D Echocardiogram has been performed.  Sherrie Sport 01/07/2020, 1:35 PM

## 2020-01-08 ENCOUNTER — Telehealth: Payer: Self-pay

## 2020-01-08 NOTE — Telephone Encounter (Signed)
Transition Care Management Follow-up Telephone Call  Date of discharge and from where: 01/07/2020, [facility]  How have you been since you were released from the hospital? Patient states that she is doing very well. Feeling much better. No complaints at this time.   Any questions or concerns? No   Items Reviewed:  Did the pt receive and understand the discharge instructions provided? Yes   Medications obtained and verified? Yes   Any new allergies since your discharge? No   Dietary orders reviewed? Yes  Do you have support at home? Yes   Functional Questionnaire: (I = Independent and D = Dependent) ADLs: I  Bathing/Dressing- I  Meal Prep- I  Eating- I  Maintaining continence- I  Transferring/Ambulation- I  Managing Meds- I  Follow up appointments reviewed:   PCP Hospital f/u appt confirmed? Yes  Scheduled to see Dr. Silvio Pate on 01/20/2020 @ 3 pm.  [facility] f/u appt confirmed? Yes  Scheduled to see gastroenterology on 02/11/2020 @ 1:30 pm.  Are transportation arrangements needed? No   If their condition worsens, is the pt aware to call PCP or go to the Emergency Dept.? Yes  Was the patient provided with contact information for the PCP's office or ED? Yes  Was to pt encouraged to call back with questions or concerns? Yes

## 2020-01-09 ENCOUNTER — Other Ambulatory Visit: Payer: Self-pay

## 2020-01-09 ENCOUNTER — Encounter: Payer: Self-pay | Admitting: Cardiovascular Disease

## 2020-01-09 ENCOUNTER — Ambulatory Visit (INDEPENDENT_AMBULATORY_CARE_PROVIDER_SITE_OTHER): Payer: Medicare HMO | Admitting: Cardiovascular Disease

## 2020-01-09 VITALS — BP 143/76 | HR 113 | Temp 97.5°F | Ht 64.0 in | Wt 110.5 lb

## 2020-01-09 DIAGNOSIS — R0602 Shortness of breath: Secondary | ICD-10-CM

## 2020-01-09 DIAGNOSIS — E785 Hyperlipidemia, unspecified: Secondary | ICD-10-CM

## 2020-01-09 DIAGNOSIS — Z720 Tobacco use: Secondary | ICD-10-CM | POA: Diagnosis not present

## 2020-01-09 DIAGNOSIS — I739 Peripheral vascular disease, unspecified: Secondary | ICD-10-CM

## 2020-01-09 NOTE — Patient Instructions (Signed)
Medication Instructions:  Your physician recommends that you continue on your current medications as directed. Please refer to the Current Medication list given to you today.  *If you need a refill on your cardiac medications before your next appointment, please call your pharmacy*   Lab Work: None ordered If you have labs (blood work) drawn today and your tests are completely normal, you will receive your results only by: Marland Kitchen MyChart Message (if you have MyChart) OR . A paper copy in the mail If you have any lab test that is abnormal or we need to change your treatment, we will call you to review the results.   Testing/Procedures: Your physician has requested that you have a lexiscan myoview. For further information please visit [email or web address]. Please follow instruction sheet, as given.     Follow-Up: At [facility], you and your health needs are our priority.  As part of our continuing mission to provide you with exceptional heart care, we have created designated Provider Care Teams.  These Care Teams include your primary Cardiologist (physician) and Advanced Practice Providers (APPs -  Physician Assistants and Nurse Practitioners) who all work together to provide you with the care you need, when you need it.  We recommend signing up for the patient portal called "MyChart".  Sign up information is provided on this After Visit Summary.  MyChart is used to connect with patients for Virtual Visits (Telemedicine).  Patients are able to view lab/test results, encounter notes, upcoming appointments, etc.  Non-urgent messages can be sent to your provider as well.   To learn more about what you can do with MyChart, go to [email or web address].    Your next appointment:   As needed   The format for your next appointment:   In Person  Provider:    You may see  Dr. Fletcher Anon  or one of the following Advanced Practice Providers on your designated Care Team:    Murray Hodgkins,  NP  Christell Faith, PA-C  Marrianne Mood, PA-C    Other Instructions [place]  Your caregiver has ordered a Stress Test with nuclear imaging. The purpose of this test is to evaluate the blood supply to your heart muscle. This procedure is referred to as a "Non-Invasive Stress Test." This is because other than having an IV started in your vein, nothing is inserted or "invades" your body. Cardiac stress tests are done to find areas of poor blood flow to the heart by determining the extent of coronary artery disease (CAD). Some patients exercise on a treadmill, which naturally increases the blood flow to your heart, while others who are  unable to walk on a treadmill due to physical limitations have a pharmacologic/chemical stress agent called Lexiscan . This medicine will mimic walking on a treadmill by temporarily increasing your coronary blood flow.   Please note: these test may take anywhere between 2-4 hours to complete  PLEASE REPORT TO [place] AT THE FIRST DESK WILL DIRECT YOU WHERE TO GO  Date of Procedure:_____________________________________  Arrival Time for Procedure:______________________________  Instructions regarding medication:   _X___ : Hold diabetes medication (Metformin) morning of procedure   PLEASE NOTIFY THE OFFICE AT LEAST 24 HOURS IN ADVANCE IF YOU ARE UNABLE TO KEEP YOUR APPOINTMENT.  [phone] AND  PLEASE NOTIFY NUCLEAR MEDICINE AT [facility] AT LEAST 24 HOURS IN ADVANCE IF YOU ARE UNABLE TO KEEP YOUR APPOINTMENT. [phone]  How to prepare for your Myoview test:  1. Do not eat  or drink after midnight 2. No caffeine for 24 hours prior to test 3. No smoking 24 hours prior to test. 4. Your medication may be taken with water.  If your doctor stopped a medication because of this test, do not take that medication. 5. Ladies, please do not wear dresses.  Skirts or pants are appropriate. Please wear a short sleeve shirt. 6. No  perfume, cologne or lotion. 7. Wear comfortable walking shoes. No heels!

## 2020-01-09 NOTE — Progress Notes (Signed)
Cardiology Office Note   Date:  01/09/2020   ID:  Kathryn Ware, DOB July 29, 1941, MRN [id number]  PCP:  Venia Carbon, MD  Cardiologist:   Kathlyn Sacramento, MD   Chief Complaint  Patient presents with  . other    Follow up from [facility]; dizziness, anemia & SOB. Meds reviewed by the pt. verbally. "doing well."       History of Present Illness: Kathryn Ware is a 79 y.o. female who was referred for evaluation of shortness of breath and mildly elevated troponin in the setting of severe anemia. She has known history of type 2 diabetes, essential hypertension, tobacco use, recurrent GI bleed due to AV malformation and hyperlipidemia. She has a long history of recurrent GI bleed due to AV malformation.  She was hospitalized  In July, 2018 of last year with blood loss anemia requiring transfusion. She had chest pain and dizziness in setting of anemia. Echocardiogram showed normal LV systolic function with moderate LVH.   Outpatient stress test was recommended but she did not follow up as her cardiac symptoms resolved.  She had multiple hospitalization since then with GI bleed.  She was again hospitalized recently with severe symptomatic anemia with a hemoglobin of 4.4.  EGD showed multiple AV malformations that were cauterized.  She was transfused.  Troponin was elevated at 900. She had an echocardiogram done which was personally reviewed by me and showed hyperdynamic LV systolic function with no significant wall motion abnormalities.  Aortic valve was mildly calcified with no significant aortic stenosis.  There was also mild mitral annular calcifications.  The patient reports  prolonged history of exertional dyspnea without chest pain.  She feels better now after recent transfusion.  She also possibly has peripheral arterial disease based on her physical exam with diminished pulses but she denies claudication.   Past Medical History:  Diagnosis Date  . Anemia   . ARF (acute renal failure)  ([place])   . Blood transfusion without reported diagnosis   . Diabetes mellitus type II   . GI (gastrointestinal bleed)   . GI AVM (gastrointestinal arteriovenous vascular malformation)    stomach  . History of echocardiogram 12/2013   a. 12/2013: EF 60-65%, DD, mild LVH, nl RV size & systolic function, mild MR/TR, mild AS, nl RVSP; b. TTE 04/2017: EF 60-65%, normal wall motion, GR1DD, mild MR, left atrium normal in size, normal RV systolic function, PASP normal   . History of nuclear stress test    a. 12/2013: low risk, no sig WMA, nondiag EKG 2/2 baseline LVH w/ repol abnl, no sig ischemia, EF 70% no artifact  . HLD (hyperlipidemia)   . HTN (hypertension)   . Hypercholesterolemia   . IDA (iron deficiency anemia)   . Polyp of colon, adenomatous   . RLS (restless legs syndrome)     Past Surgical History:  Procedure Laterality Date  . ABDOMINAL ADHESION SURGERY  11/2001   Dr. Tamala Julian  . ABDOMINAL HYSTERECTOMY  1976   RSO (fibroid)  . COLONOSCOPY WITH PROPOFOL N/A 07/07/2016   Procedure: COLONOSCOPY WITH PROPOFOL;  Surgeon: Manya Silvas, MD;  [facility] END[facility]COPY;  Service: Endoscopy;  Laterality: N/A;  . COLONOSCOPY WITH PROPOFOL N/A 12/14/2016   Procedure: COLONOSCOPY WITH PROPOFOL;  Surgeon: Manya Silvas, MD;  Location: [facility] ENDOSCOPY;  Service: Endoscopy;  Laterality: N/A;  . DOBUTAMINE STRESS ECHO  11/10   normal  . DOPPLER ECHOCARDIOGRAPHY     EF 55%, mild MR, TR  .  ENTEROSCOPY N/A 06/08/2018   Procedure: ENTEROSCOPY;  Surgeon: Lin Landsman, MD;  Location: [facility] ENDOSCOPY;  Service: Gastroenterology;  Laterality: N/A;  . ENTEROSCOPY N/A 01/06/2020   Procedure: ENTEROSCOPY;  Surgeon: Lin Landsman, MD;  Location: [facility] ENDOSCOPY;  Service: Gastroenterology;  Laterality: N/A;  . ESOPHAGOGASTRODUODENOSCOPY  07/2006   multiple angioectasias  . ESOPHAGOGASTRODUODENOSCOPY Left 03/12/2015   Procedure: place tube in throat and evaluate stomach and duodenum for source of  bleeding. Cauterize if concern for rebleeding.;  Surgeon: Hulen Luster, MD;  Location: [facility] ENDOSCOPY;  Service: Endoscopy;  Laterality: Left;  . ESOPHAGOGASTRODUODENOSCOPY N/A 12/14/2016   Procedure: ESOPHAGOGASTRODUODENOSCOPY (EGD);  Surgeon: Manya Silvas, MD;  Location: [facility] ENDOSCOPY;  Service: Endoscopy;  Laterality: N/A;  . ESOPHAGOGASTRODUODENOSCOPY N/A 07/19/2017   Procedure: ESOPHAGOGASTRODUODENOSCOPY (EGD);  Surgeon: Lin Landsman, MD;  Location: [facility] ENDOSCOPY;  Service: Gastroenterology;  Laterality: N/A;  . ESOPHAGOGASTRODUODENOSCOPY (EGD) WITH PROPOFOL N/A 11/23/2015   Procedure: ESOPHAGOGASTRODUODENOSCOPY (EGD) WITH PROPOFOL;  Surgeon: Hulen Luster, MD;  Location: [facility] ENDOSCOPY;  Service: Gastroenterology;  Laterality: N/A;  . ESOPHAGOGASTRODUODENOSCOPY (EGD) WITH PROPOFOL N/A 07/07/2016   Procedure: ESOPHAGOGASTRODUODENOSCOPY (EGD) WITH PROPOFOL;  Surgeon: Manya Silvas, MD;  Location: [facility] ENDOSCOPY;  Service: Endoscopy;  Laterality: N/A;  . ESOPHAGOGASTRODUODENOSCOPY (EGD) WITH PROPOFOL N/A 05/19/2017   Procedure: ESOPHAGOGASTRODUODENOSCOPY (EGD) WITH PROPOFOL;  Surgeon: Manya Silvas, MD;  Location: [facility] ENDOSCOPY;  Service: Endoscopy;  Laterality: N/A;  . laryngeal polyp  10/2008   Dr. Tami Ribas  . TOP       Current Outpatient Medications  Medication Sig Dispense Refill  . acetaminophen (TYLENOL) 325 MG tablet Take 2 tablets (650 mg total) by mouth every 6 (six) hours as needed for mild pain (or Fever >/= 101).    Marland Kitchen albuterol (PROAIR HFA) 108 (90 Base) MCG/ACT inhaler INHALE 2 PUFFS INTO THE LUNGS EVERY 6 HOURS AS NEEDED FOR WHEEZING OR SHORTNESS OF BREATH 8.5 g 2  . clonazePAM (KLONOPIN) 0.5 MG tablet Take 1-2 tablets (0.5-1 mg total) by mouth at bedtime as needed. For restless legs 60 tablet 0  . ferrous sulfate 325 (65 FE) MG tablet Take 1 tablet (325 mg total) by mouth 2 (two) times daily. 60 tablet 3  . lisinopril-hydrochlorothiazide (ZESTORETIC) 20-12.5 MG tablet  Take 2 tablets by mouth daily. 180 tablet 3  . metFORMIN (GLUCOPHAGE-XR) 500 MG 24 hr tablet TAKE 1 TABLET(500 MG) BY MOUTH DAILY WITH BREAKFAST 30 tablet 11  . omeprazole (PRILOSEC) 40 MG capsule TAKE 1 CAPSULE(40 MG) BY MOUTH TWICE DAILY BEFORE A MEAL 180 capsule 3  . simvastatin (ZOCOR) 80 MG tablet TAKE 1 TABLET(80 MG) BY MOUTH DAILY 90 tablet 3  . vitamin B-12 (CYANOCOBALAMIN) 1000 MCG tablet Take 1,000 mcg by mouth daily.     No current facility-administered medications for this visit.    Allergies:   Nifedipine and Metformin    Social History:  The patient  reports that she has been smoking cigarettes. She has been smoking about 1.00 pack per day. She has never used smokeless tobacco. She reports that she does not drink alcohol or use drugs.   Family History:  The patient's family history includes Cancer in her sister; Diabetes in an other family member; Hypertension in her mother and another family member; Stroke in her father.    ROS:  Please see the history of present illness.   Otherwise, review of systems are positive for none.   All other systems are reviewed and negative.    PHYSICAL  EXAM: VS:  BP (!) 143/76 (BP Location: Left Arm, Patient Position: Sitting, Cuff Size: Normal)   Pulse (!) 113   Temp (!) 97.5 F (36.4 C)   Ht 5\' 4"  (1.626 m)   Wt 110 lb 8 oz (50.1 kg)   SpO2 99%   BMI 18.97 kg/m  , BMI Body mass index is 18.97 kg/m. GEN: Well nourished, well developed, in no acute distress  HEENT: normal  Neck: no JVD, carotid bruits, or masses Cardiac: RRR; no rubs, or gallops,no edema .  2 out of 6 systolic murmur in the aortic area.  The murmur radiates to the carotid arteries.  Distal pedal pulses are not palpable. Respiratory:  clear to auscultation bilaterally, normal work of breathing GI: soft, nontender, nondistended, + BS MS: no deformity or atrophy  Skin: warm and dry, no rash Neuro:  Strength and sensation are intact Psych: euthymic mood, full  affect   EKG:  EKG is ordered today. The ekg ordered today demonstrates sinus tachycardia with LVH with repolarization abnormalities.   Recent Labs: 01/05/2020: ALT 10; B Natriuretic Peptide 343.0 01/07/2020: BUN 20; Creatinine, Ser 1.05; Hemoglobin 10.5; Platelets 173; Potassium 3.9; Sodium 143    Lipid Panel    Component Value Date/Time   CHOL 98 01/06/2020 0710   CHOL 126 01/06/2014 0039   TRIG 107 01/06/2020 0710   TRIG 94 01/06/2014 0039   HDL 27 (L) 01/06/2020 0710   HDL 43 01/06/2014 0039   CHOLHDL 3.6 01/06/2020 0710   VLDL 21 01/06/2020 0710   VLDL 19 01/06/2014 0039   LDLCALC 50 01/06/2020 0710   LDLCALC 64 01/06/2014 0039      Wt Readings from Last 3 Encounters:  01/09/20 110 lb 8 oz (50.1 kg)  01/06/20 110 lb 14.3 oz (50.3 kg)  11/26/19 111 lb (50.3 kg)       No flowsheet data found.    ASSESSMENT AND PLAN:  1.  Exertional dyspnea: Recently elevated troponin in the setting of severe anemia likely due to supply demand ischemia.  However, the patient has multiple risk factors for coronary artery disease and thus I recommend risk stratification with a Lexiscan Myoview.  Any invasive evaluation will be reserved only for high risk ischemia given limitations related to COPD, continued tobacco use and recurrent GI bleed.  2.  Possible peripheral arterial disease based on physical exam: Patient denies claudication and has no evidence of critical limb ischemia.  Recommend treatment of risk factors and especially smoking cessation..  3.  Tobacco use: Discussed with her the importance of smoking cessation but she reports inability to quit.  4.  Hyperlipidemia: Continue treatment with simvastatin.  Most recent lipid profile showed an LDL of 50.    Disposition:   FU with me as needed  Signed,  Kathlyn Sacramento, MD  01/09/2020 8:21 AM    [place]

## 2020-01-20 ENCOUNTER — Ambulatory Visit: Payer: Medicare HMO | Admitting: Internal Medicine

## 2020-01-21 ENCOUNTER — Other Ambulatory Visit: Payer: Self-pay | Admitting: Internal Medicine

## 2020-01-23 MED ORDER — ONETOUCH ULTRA VI STRP: ORAL_STRIP | 3 refills | Status: DC

## 2020-01-23 NOTE — Addendum Note (Signed)
Addended by: Pilar Grammes on: 01/23/2020 03:15 PM   Modules accepted: Orders

## 2020-01-23 NOTE — Telephone Encounter (Addendum)
Pt calls and request refill one touch ultra blue test strips; pt said she test 2 - 3 x a wk. Pt said it has probably been 5 years since she purchased test strips. Please advise. Larene Beach CMA said to send note to her and let pt know [facility].

## 2020-01-30 ENCOUNTER — Other Ambulatory Visit: Payer: Self-pay

## 2020-01-30 ENCOUNTER — Encounter
Admission: RE | Admit: 2020-01-30 | Discharge: 2020-01-30 | Disposition: A | Discharge status: Home / Self Care | Payer: Medicare HMO | Source: Ambulatory Visit | Attending: Cardiovascular Disease | Admitting: Cardiovascular Disease

## 2020-01-30 DIAGNOSIS — R0602 Shortness of breath: Secondary | ICD-10-CM | POA: Diagnosis not present

## 2020-01-30 MED ORDER — TECHNETIUM TC 99M TETROFOSMIN IV KIT
28.8450 | PACK | Freq: Once | INTRAVENOUS | Status: AC | PRN
  Administered 2020-01-30: 28.845 via INTRAVENOUS

## 2020-01-30 MED ORDER — REGADENOSON 0.4 MG/5ML IV SOLN
0.4000 mg | Freq: Once | INTRAVENOUS | Status: AC
  Administered 2020-01-30: 0.4 mg via INTRAVENOUS

## 2020-01-30 MED ORDER — TECHNETIUM TC 99M TETROFOSMIN IV KIT
10.0000 | PACK | Freq: Once | INTRAVENOUS | Status: AC | PRN
  Administered 2020-01-30: 10:00:00 10.026 via INTRAVENOUS

## 2020-01-31 LAB — NM MYOCAR MULTI W/SPECT W/WALL MOTION / EF
LV dias vol: 23 mL (ref 46–106)
LV sys vol: 6 mL
Peak HR: 109 {beats}/min
Percent HR: 76 %
Rest HR: 69 {beats}/min
SDS: 0
SRS: 0
SSS: 0
TID: 1.06

## 2020-02-03 ENCOUNTER — Telehealth: Payer: Self-pay

## 2020-02-03 ENCOUNTER — Telehealth: Payer: Self-pay | Admitting: *Deleted

## 2020-02-03 NOTE — Telephone Encounter (Signed)
The onetouch ultra mini uses the onetouch ultra strips. Maybe [employer] covers something else. I will call [facility] later.

## 2020-02-03 NOTE — Telephone Encounter (Signed)
Maybe we can get her a machine that will match the strips she has

## 2020-02-03 NOTE — Telephone Encounter (Signed)
Spoke to pt. Advised her to check with [facility] because an rx was sent 01-23-20 for OneTouch Ultra test strips.

## 2020-02-03 NOTE — Telephone Encounter (Signed)
Sydney at [facility] left a voicemail stating that the patient has told them that the scripts for her lancets, test strips and kit are all wrong. Sydney requested a call back to confirm what supplies the patient is suppose to get.

## 2020-02-03 NOTE — Telephone Encounter (Signed)
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 Any OneTouch Ultra test strip will fit that. Please see previous call about this. I will document the proper things there.

## 2020-02-03 NOTE — Telephone Encounter (Signed)
[place] Night - Client >>>Contains Verbal Order - Signature Required<<< TELEPHONE ADVICE RECORD AccessNurse Patient Name: Kathryn Ware Gender: Female DOB: 1940-11-06 Age: 79 Y 11 M 20 D Return Phone Number: [phone] (Primary) Address: City/State/Zip: [place] [address] Client [facility] [place] [place] Night - Client Client Site [place] Physician Viviana Simpler - MD Contact Type Call Who Is Calling Patient / Member / Family / Caregiver Call Type Triage / Clinical Relationship To Patient Self Return Phone Number [phone] (Primary) Chief Complaint Prescription Refill or Medication Request (non symptomatic) Reason for Call Medication Question / Request Initial Comment Caller states that she is needing her a glucometer for her test strips. asymptomatic. Translation No Nurse Assessment Nurse: Adrian Blackwater, RN, Claiborne Billings Date/Time Eilene Ghazi Time): 02/01/2020 12:25:54 PM Please select the assessment type ---Verbal order / New medication order Additional Documentation ---Pt states she was sent the wrong test strips by [facility]. Pt uses the One Touch Ultra Mini glucose meter but [employer] sent her test strips which do not work with her meter. Pt has not been able to check her blood sugar for 2-3 days. Does the client directives allow for assistance with medications after hours? ---Yes Other current medications? ---Yes List current medications. ---Metformin, Clonazepam, Simvastatin, Lisinopril, Omeprazole Medication allergies? ---No Pharmacy name and phone number. ---[facility] in [place] on [address] Does the client directive allow for RN to call in the medication order to the pharmacy? ---Yes Additional Documentation ---RN will speak to pharmacy about test strips and inform pt if able to refill. Guidelines Guideline Title Affirmed Question Affirmed Notes Nurse Date/Time (Eastern Time) Disp. Time Eilene Ghazi Time)  Disposition Final User 02/01/2020 12:44:38 PM Pharmacy Call Adrian Blackwater, RN, Claiborne Billings Reason: RN called [facility] [phone] and they stated that pt had gotten One Touch PLEASE NOTE: All timestamps contained within this report are represented as [place] Standard Time. CONFIDENTIALTY NOTICE: This fax transmission is intended only for the addressee. It contains information that is legally privileged, confidential or otherwise protected from use or disclosure. If you are not the intended recipient, you are strictly prohibited from reviewing, disclosing, copying using or disseminating any of this information or taking any action in reliance on or regarding this information. If you have received this fax in error, please notify us immediately by telephone so that we can arrange for its return to [place]. Phone: [phone], Toll-Free: [phone], Fax: [phone] Page: 2 of 3 Call Id: [id number] [place]. Time Eilene Ghazi Time) Disposition Final User Ultra Blue test strips before in 2019 but pt had Accucheck Guide test strips prescribed last time on April 4 because OneTouch test strips are not covered by her insurance. Pt has [employer]. Pt needs an Rx for the Accucheck Guide glucose meter and lancets. 02/01/2020 12:47:02 PM Send To RN Personal Adrian Blackwater, RN, Claiborne Billings 02/01/2020 12:47:32 PM Send To RN Personal Adrian Blackwater, RN, Claiborne Billings 02/01/2020 1:09:45 PM Called On-Call Provider Adrian Blackwater, RN, Claiborne Billings 02/01/2020 1:15:04 PM Pharmacy Call Adrian Blackwater, RN, Claiborne Billings Reason: RN called VO to [phone] for Accuchek Guide glucose meter and lancets. 02/01/2020 12:47:16 PM Clinical Call Yes Adrian Blackwater, RN, Claiborne Billings Verbal Orders/Maintenance Medications Medication Refill Route Dosage Regime Duration Admin Instructions User Name [place] Yes Per Package Instructions Use as directed Adrian Blackwater, RN, Claiborne Billings Comments User: Delana Meyer, RN Date/Time Eilene Ghazi Time): 02/01/2020 12:37:34 PM [place]  [phone] User: Delana Meyer, RN Date/Time Eilene Ghazi Time): 02/01/2020 1:19:34 PM RN advised caller that a new Rx has been  called to [facility] for a new Glucose meter and lancets, and to follow package instructions to set it up. Pt verbalized understanding and thanks. Paging DoctorName Phone DateTime Result/Outcome Message Type Notes Nani Ravens MD [phone] 02/01/2020 1:09:45 PM Called On Call Provider - Reached Doctor Paged Nani Ravens, - MD 02/01/2020 1:10:25 PM Spoke with On Call - General Message Result MD gave VO for Accuchek Guide meter and lancets to be called to pharmacy PLEASE NOTE: All timestamps contained within this report are represented as [place] Standard Time. CONFIDENTIALTY NOTICE: This fax transmission is intended only for the addressee. It contains information that is legally privileged, confidential or otherwise protected from use or disclosure. If you are not the intended recipient, you are strictly prohibited from reviewing, disclosing, copying using or disseminating any of this information or taking any action in reliance on or regarding this information. If you have received this fax in error, please notify us immediately by telephone so that we can arrange for its return to [place]. Phone: [phone], Toll-Free: [phone], Fax: [phone] Page: 3 of 3 Call Id: [id number] [place] >>>Contains Verbal Order - Signature Required<<< [address] [place] [address] [phone] Fax: [phone] [place] Night - Client [place] - Night Date: 02/01/2020 From: [facility] Department To: Viviana Simpler - MD Please sign the order for the approved drug(s) given by our call center nurse on your behalf. Fax to [phone] within 5 business days. Thank you. Date Eilene Ghazi Time): 02/01/2020 11:03:55 AM Triage RN: Delana Meyer, RN NAME: Kathryn Ware PHONE NUMBER: [phone] (Primary) BIRTHDATE: 09-06-1941 ADDRESS: CITY/STATE/ZIP: [place] [address] CALLER: Self NAME: Rx Given Medication Ordering Physician Refill Route Dosage Regime Duration Admin Instructions User Name Accu chek Guide Glucometer [place], NOT [place] Yes Per Package Instructions Use as directed Adrian Blackwater, RN, Claiborne Billings MD Signature Date

## 2020-02-03 NOTE — Telephone Encounter (Signed)
Patient called back  She stated she spoke with [facility] and stated that they were the wrong strips. Because they did the same thing they are not able to change them   Please advise

## 2020-02-04 ENCOUNTER — Telehealth: Payer: Self-pay | Admitting: *Deleted

## 2020-02-04 NOTE — Telephone Encounter (Signed)
Spoke to pt. They gave her an Accu-chek guide and test strips at [facility]. Said she paid $3 for it.

## 2020-02-04 NOTE — Addendum Note (Signed)
Addended by: Pilar Grammes on: 02/04/2020 01:40 PM   Modules accepted: Orders

## 2020-02-04 NOTE — Telephone Encounter (Signed)
Patient left a voicemail wanting to know if she switches to [facility] can she still get all of her medications thru them that Dr. Silvio Pate has her on?

## 2020-02-04 NOTE — Telephone Encounter (Signed)
Spoke to pt and advised her we can send rxs to [facility].

## 2020-02-05 ENCOUNTER — Other Ambulatory Visit: Payer: Self-pay | Admitting: Internal Medicine

## 2020-02-05 NOTE — Telephone Encounter (Signed)
Last filled 01-04-20 #60 Last OV 11-26-19 Next OV 02-20-20 [facility] [place]

## 2020-02-10 ENCOUNTER — Other Ambulatory Visit: Payer: Self-pay

## 2020-02-11 ENCOUNTER — Ambulatory Visit: Payer: Medicare HMO | Admitting: Gastroenterology

## 2020-02-11 ENCOUNTER — Other Ambulatory Visit: Payer: Self-pay

## 2020-02-11 ENCOUNTER — Encounter: Payer: Self-pay | Admitting: Gastroenterology

## 2020-02-11 VITALS — BP 120/70 | HR 118 | Temp 97.6°F | Ht 64.0 in | Wt 102.0 lb

## 2020-02-11 DIAGNOSIS — D500 Iron deficiency anemia secondary to blood loss (chronic): Secondary | ICD-10-CM

## 2020-02-11 DIAGNOSIS — K5520 Angiodysplasia of colon without hemorrhage: Secondary | ICD-10-CM

## 2020-02-11 NOTE — Progress Notes (Signed)
Kathryn Darby, MD [address]  [place]  [place], [place] [address]  Main: [phone]  Fax: [phone]    Gastroenterology Consultation  Referring Provider:     Venia Carbon, MD Primary Care Physician:  Venia Carbon, MD Primary Gastroenterologist:  Dr. Cephas Ware Reason for Consultation:     Chronic iron deficiency anemia, small bowel AVMs        HPI:   Kathryn Ware is a 79 y.o. female referred by Dr. Venia Carbon, MD  for consultation & management of chronic iron deficiency anemia from known history of small bowel AVMs.  Patient has been chronically on oral iron.  She had several endoscopic procedures including small bowel enteroscopy as well and was treated for small bowel AVMs in her small intestine.  Her most recent admission for GI bleed was on 01/05/2020 and she dropped her hemoglobin to 4.4 from baseline of 10.  She is adequately resuscitated.  She also developed elevated troponin which is thought to be secondary to demand ischemia per cardiology.  Her hemoglobin was 10.5 on discharge.  She also received IV iron.  Patient underwent patient endoscopy which revealed several AVMs in duodenum and jejunum that were treated with cautery  Interval summary Patient reports doing fairly well.  She is on oral iron once a day which keeps her stools dark.  She lost few pounds since discharge.  She reports that she does not like what her sister is making.  She denies abdominal pain, nausea vomiting.  She is also seen by her cardiologist, Dr. Fletcher Anon.  Patient underwent nuclear medicine myocardial perfusion imaging which did not reveal any wall motion abnormalities.  Patient continues to smoke few cigarettes daily.  NSAIDs: None  Antiplts/Anticoagulants/Anti thrombotics: None   GI Procedures:  EGD/push enteroscopy 07/19/2017 Normal stomach, esophagus - A few non-bleeding angiodysplastic lesions in the duodenum. Treated with argon plasma coagulation (APC). -  No specimens collected.  EGD 03/12/2015 Multiple nonbleeding AVMs in duodenum, s/p cautery EGD 11/23/2015 Multiple nonbleeding AVMs in duodenum, s/p APC EGD 07/07/2016 Multiple nonbleeding AVMs in duodenum, status post APC and clips EGD 12/14/2016 Solitary AVM of the duodenum,treated with APC A. DUODENAL BULB POLYP; COLD BIOPSY:  - NODULAR BRUNNER'S GLAND HYPERPLASIA AND ACUTE DUODENITIS, SEE NOTE.  - NEGATIVE FOR INTRA-EPITHELIAL LYMPHOCYTOSIS, DYSPLASIA AND MALIGNANCY. EGD 05/19/2017 No AVMs were found  Colonoscopy 12/14/2016 2 small polyps were found in the splenic flexure and descending colon, removed with hot snare. Internal hemorrhoids DIAGNOSIS:   B. COLON POLYP 2, HEPATIC FLEXURE; HOT SNARE:  - TUBULAR ADENOMAS (2).  - NEGATIVE FOR HIGH-GRADE DYSPLASIA AND MALIGNANCY.  Colonoscopy 07/07/2016 Single 15 millimeter polyp in mid ascending colon, removed in piecemeal with hot snare, placed DIAGNOSIS:  COLON POLYP, ASCENDING; HOT SNARE:  - TUBULOVILLOUS ADENOMA.  - NEGATIVE FOR HIGH-GRADE DYSPLASIA AND MALIGNANCY.  Colonoscopy 07/24/2006 Multiple AVMs   Small bowel endoscopy 01/06/2020 - Multiple non-bleeding angiodysplastic lesions in the jejunum. Treated with argon plasma coagulation (APC). - Multiple non-bleeding angiodysplastic lesions in the duodenum. Treated with argon plasma coagulation (APC). - Normal esophagus. - Normal stomach. - No specimens collected.  Past Medical History:  Diagnosis Date  . Anemia   . ARF (acute renal failure) ([place])   . Blood transfusion without reported diagnosis   . Diabetes mellitus type II   . GI (gastrointestinal bleed)   . GI AVM (gastrointestinal arteriovenous vascular malformation)    stomach  . History of echocardiogram 12/2013   a.  12/2013: EF 60-65%, DD, mild LVH, nl RV size & systolic function, mild MR/TR, mild AS, nl RVSP; b. TTE 04/2017: EF 60-65%, normal wall motion, GR1DD, mild MR, left atrium normal in size, normal  RV systolic function, PASP normal   . History of nuclear stress test    a. 12/2013: low risk, no sig WMA, nondiag EKG 2/2 baseline LVH w/ repol abnl, no sig ischemia, EF 70% no artifact  . HLD (hyperlipidemia)   . HTN (hypertension)   . Hypercholesterolemia   . IDA (iron deficiency anemia)   . Polyp of colon, adenomatous   . RLS (restless legs syndrome)     Past Surgical History:  Procedure Laterality Date  . ABDOMINAL ADHESION SURGERY  11/2001   Dr. Tamala Julian  . ABDOMINAL HYSTERECTOMY  1976   RSO (fibroid)  . COLONOSCOPY WITH PROPOFOL N/A 07/07/2016   Procedure: COLONOSCOPY WITH PROPOFOL;  Surgeon: Manya Silvas, MD;  Location: [facility] ENDOSCOPY;  Service: Endoscopy;  Laterality: N/A;  . COLONOSCOPY WITH PROPOFOL N/A 12/14/2016   Procedure: COLONOSCOPY WITH PROPOFOL;  Surgeon: Manya Silvas, MD;  Location: [facility] ENDOSCOPY;  Service: Endoscopy;  Laterality: N/A;  . DOBUTAMINE STRESS ECHO  11/10   normal  . DOPPLER ECHOCARDIOGRAPHY     EF 55%, mild MR, TR  . ENTEROSCOPY N/A 06/08/2018   Procedure: ENTEROSCOPY;  Surgeon: Lin Landsman, MD;  Location: [facility] ENDOSCOPY;  Service: Gastroenterology;  Laterality: N/A;  . ENTEROSCOPY N/A 01/06/2020   Procedure: ENTEROSCOPY;  Surgeon: Lin Landsman, MD;  Location: [facility] ENDOSCOPY;  Service: Gastroenterology;  Laterality: N/A;  . ESOPHAGOGASTRODUODENOSCOPY  07/2006   multiple angioectasias  . ESOPHAGOGASTRODUODENOSCOPY Left 03/12/2015   Procedure: place tube in throat and evaluate stomach and duodenum for source of bleeding. Cauterize if concern for rebleeding.;  Surgeon: Hulen Luster, MD;  Location: [facility] ENDOSCOPY;  Service: Endoscopy;  Laterality: Left;  . ESOPHAGOGASTRODUODENOSCOPY N/A 12/14/2016   Procedure: ESOPHAGOGASTRODUODENOSCOPY (EGD);  Surgeon: Manya Silvas, MD;  Location: [facility] ENDOSCOPY;  Service: Endoscopy;  Laterality: N/A;  . ESOPHAGOGASTRODUODENOSCOPY N/A 07/19/2017   Procedure: ESOPHAGOGASTRODUODENOSCOPY (EGD);  Surgeon:  Lin Landsman, MD;  Location: [facility] ENDOSCOPY;  Service: Gastroenterology;  Laterality: N/A;  . ESOPHAGOGASTRODUODENOSCOPY (EGD) WITH PROPOFOL N/A 11/23/2015   Procedure: ESOPHAGOGASTRODUODENOSCOPY (EGD) WITH PROPOFOL;  Surgeon: Hulen Luster, MD;  Location: [facility] ENDOSCOPY;  Service: Gastroenterology;  Laterality: N/A;  . ESOPHAGOGASTRODUODENOSCOPY (EGD) WITH PROPOFOL N/A 07/07/2016   Procedure: ESOPHAGOGASTRODUODENOSCOPY (EGD) WITH PROPOFOL;  Surgeon: Manya Silvas, MD;  Location: [facility] ENDOSCOPY;  Service: Endoscopy;  Laterality: N/A;  . ESOPHAGOGASTRODUODENOSCOPY (EGD) WITH PROPOFOL N/A 05/19/2017   Procedure: ESOPHAGOGASTRODUODENOSCOPY (EGD) WITH PROPOFOL;  Surgeon: Manya Silvas, MD;  Location: [facility] ENDOSCOPY;  Service: Endoscopy;  Laterality: N/A;  . laryngeal polyp  10/2008   Dr. Tami Ribas  . TOP      Current Outpatient Medications:  .  Accu-Chek Softclix Lancets lancets, , Disp: , Rfl:  .  acetaminophen (TYLENOL) 325 MG tablet, Take 2 tablets (650 mg total) by mouth every 6 (six) hours as needed for mild pain (or Fever >/= 101)., Disp: , Rfl:  .  albuterol (PROAIR HFA) 108 (90 Base) MCG/ACT inhaler, INHALE 2 PUFFS INTO THE LUNGS EVERY 6 HOURS AS NEEDED FOR WHEEZING OR SHORTNESS OF BREATH, Disp: 8.5 g, Rfl: 2 .  Blood Glucose Monitoring Suppl (ACCU-CHEK GUIDE) w/Device KIT, by Does not apply route., Disp: , Rfl:  .  clonazePAM (KLONOPIN) 0.5 MG tablet, TAKE 1 TO 2 TABLETS(0.5 TO 1 MG) BY  MOUTH AT BEDTIME AS NEEDED FOR RESTLESS LEGS, Disp: 60 tablet, Rfl: 0 .  ferrous sulfate 325 (65 FE) MG tablet, Take 1 tablet (325 mg total) by mouth 2 (two) times daily., Disp: 60 tablet, Rfl: 3 .  glucose blood (ACCU-CHEK GUIDE) test strip, 1 each by Other route as needed for other. Use as instructed, Disp: , Rfl:  .  lisinopril-hydrochlorothiazide (ZESTORETIC) 20-12.5 MG tablet, Take 2 tablets by mouth daily., Disp: 180 tablet, Rfl: 3 .  metFORMIN (GLUCOPHAGE-XR) 500 MG 24 hr tablet, TAKE 1 TABLET(500  MG) BY MOUTH DAILY WITH BREAKFAST, Disp: 30 tablet, Rfl: 11 .  omeprazole (PRILOSEC) 40 MG capsule, TAKE 1 CAPSULE(40 MG) BY MOUTH TWICE DAILY BEFORE A MEAL, Disp: 180 capsule, Rfl: 3 .  simvastatin (ZOCOR) 80 MG tablet, TAKE 1 TABLET(80 MG) BY MOUTH DAILY, Disp: 90 tablet, Rfl: 3 .  vitamin B-12 (CYANOCOBALAMIN) 1000 MCG tablet, Take 1,000 mcg by mouth daily., Disp: , Rfl:    Family History  Problem Relation Age of Onset  . Stroke Father   . Cancer Sister        breast cancer  . Hypertension Mother   . Hypertension Other        sibling  . Diabetes Other        grandmother     Social History   Tobacco Use  . Smoking status: Current Every Day Smoker    Packs/day: 1.00    Types: Cigarettes  . Smokeless tobacco: Never Used  Substance Use Topics  . Alcohol use: No  . Drug use: No    Allergies as of 02/11/2020 - Review Complete 02/11/2020  Allergen Reaction Noted  . Nifedipine  09/05/2006  . Metformin Other (See Comments) 09/05/2006    Review of Systems:    All systems reviewed and negative except where noted in HPI.   Physical Exam:  BP 120/70 (BP Location: Left Arm, Patient Position: Sitting, Cuff Size: Normal)   Pulse (!) 118   Temp 97.6 F (36.4 C) (Oral)   Ht _0  (1.626 m)   Wt 102 lb (46.3 kg)   BMI 17.51 kg/m  No LMP recorded. Patient has had a hysterectomy.  General:   Alert, thin built, appears poorly nourished, pleasant and cooperative in NAD Head:  Normocephalic and atraumatic. Eyes:  Sclera clear, no icterus.   Conjunctiva pink. Ears:  Normal auditory acuity. Nose:  No deformity, discharge, or lesions. Mouth:  No deformity or lesions,oropharynx pink & moist. Neck:  Supple; no masses or thyromegaly. Lungs:  Respirations even and unlabored.  Clear throughout to auscultation.   No wheezes, crackles, or rhonchi. No acute distress. Heart: Increased heart rate, regular rhythm; no murmurs, clicks, rubs, or gallops. Abdomen:  Normal bowel sounds. Soft,  non-tender and non-distended without masses, hepatosplenomegaly or hernias noted.  No guarding or rebound tenderness.   Rectal: Not performed Msk:  Symmetrical without gross deformities. Good, equal movement & strength bilaterally. Pulses:  Normal pulses noted. Extremities:  No clubbing or edema.  No cyanosis. Neurologic:  Alert and oriented x3;  grossly normal neurologically. Skin:  Intact without significant lesions or rashes. No jaundice. Psych:  Alert and cooperative. Normal mood and affect.  Imaging Studies: Reviewed  Assessment and Plan:   ALYSIAH SUPPA is a 79 y.o. female with history of peripheral artery disease, tobacco use, chronic iron deficiency anemia secondary to chronic blood loss from small bowel AVMs.  Patient underwent several endoscopic procedures in the past and was treated for AVMs.  Patient is currently on oral iron which keeps her stools dark.  She denies chest pain, shortness of breath  Recommend checking CBC, iron panel, B12 and folate panel today Continue oral iron Will refer her to hematology based on the iron panel discontinue oral iron Reiterated to the patient to follow-up with me every 3 months and have labs done regularly She may benefit from octreotide subcutaneous injections history of bleeding AVMs   Follow up in 3 to 4 months   Kathryn Darby, MD

## 2020-02-12 ENCOUNTER — Telehealth: Payer: Self-pay

## 2020-02-12 LAB — B12 AND FOLATE PANEL
Folate: 8.3 ng/mL (ref >3.0)
Vitamin B-12: >2000 pg/mL — ABNORMAL HIGH (ref 232–1245)

## 2020-02-12 LAB — IRON,TIBC AND FERRITIN PANEL
Ferritin: 160 ng/mL — ABNORMAL HIGH (ref 15–150)
Iron Saturation: 33 % (ref 15–55)
Iron: 92 ug/dL (ref 27–139)
Total Iron Binding Capacity: 283 ug/dL (ref 250–450)
UIBC: 191 ug/dL (ref 118–369)

## 2020-02-12 NOTE — Telephone Encounter (Signed)
Patient verbalized understanding  

## 2020-02-12 NOTE — Telephone Encounter (Signed)
-----   Message from Lin Landsman, MD sent at 02/12/2020  4:38 PM EDT ----- Please inform patient that she has adequate iron and B12 levels.  She can stop iron and B12 supplements.  We will recheck labs during next visit  Rohini Vanga

## 2020-02-20 ENCOUNTER — Other Ambulatory Visit: Payer: Self-pay

## 2020-02-20 ENCOUNTER — Encounter: Payer: Self-pay | Admitting: Internal Medicine

## 2020-02-20 ENCOUNTER — Ambulatory Visit (INDEPENDENT_AMBULATORY_CARE_PROVIDER_SITE_OTHER): Payer: Medicare HMO | Admitting: Internal Medicine

## 2020-02-20 VITALS — BP 124/62 | HR 104 | Temp 97.9°F | Ht 63.25 in | Wt 102.2 lb

## 2020-02-20 DIAGNOSIS — D500 Iron deficiency anemia secondary to blood loss (chronic): Secondary | ICD-10-CM | POA: Diagnosis not present

## 2020-02-20 DIAGNOSIS — N1831 Chronic kidney disease, stage 3a: Secondary | ICD-10-CM | POA: Diagnosis not present

## 2020-02-20 DIAGNOSIS — K5520 Angiodysplasia of colon without hemorrhage: Secondary | ICD-10-CM | POA: Diagnosis not present

## 2020-02-20 DIAGNOSIS — E441 Mild protein-calorie malnutrition: Secondary | ICD-10-CM | POA: Diagnosis not present

## 2020-02-20 DIAGNOSIS — I5032 Chronic diastolic (congestive) heart failure: Secondary | ICD-10-CM

## 2020-02-20 NOTE — Assessment & Plan Note (Signed)
Worsened at first but better after IV fluids in hospital Will recheck today

## 2020-02-20 NOTE — Patient Instructions (Signed)
Please restart the daily iron. Increase your boost to 2 cans daily---in between meals---and eat your normal meals as well (you need to regain the weight you lost)

## 2020-02-20 NOTE — Assessment & Plan Note (Signed)
Discussed increasing calories and boost

## 2020-02-20 NOTE — Assessment & Plan Note (Signed)
No exacerbation Weight actually down and no fluid excess

## 2020-02-20 NOTE — Progress Notes (Signed)
Subjective:    Patient ID: Kathryn Ware, female    DOB: December 22, 1940, 79 y.o.   MRN: [id number]  HPI Here for hospital follow up from last month This visit occurred during the SARS-CoV-2 public health emergency.  Safety protocols were in place, including screening questions prior to the visit, additional usage of staff PPE, and extensive cleaning of exam room while observing appropriate contact time as indicated for disinfecting solutions.   Had felt weak in the knees Thought it could be related to the COVID vaccine but persists Hgb down to 4.4 in ER Transfused and had IV iron in hospital EGD with cautery to multiple spots  Repeat iron studies last week --told to stop the iron  Reviewed cardiology issues Elevated troponin Echo in hospital showed LVH but normal EF Had nuclear stress test which was negative No chest pain or SOB  Has lost weight Working with sister on diet she will eat One boost daily for now  Reviewed renal labs CKD 3a---seems stable  Current Outpatient Medications on File Prior to Visit  Medication Sig Dispense Refill  . Accu-Chek Softclix Lancets lancets     . acetaminophen (TYLENOL) 325 MG tablet Take 2 tablets (650 mg total) by mouth every 6 (six) hours as needed for mild pain (or Fever >/= 101).    Marland Kitchen albuterol (PROAIR HFA) 108 (90 Base) MCG/ACT inhaler INHALE 2 PUFFS INTO THE LUNGS EVERY 6 HOURS AS NEEDED FOR WHEEZING OR SHORTNESS OF BREATH 8.5 g 2  . Blood Glucose Monitoring Suppl (ACCU-CHEK GUIDE) w/Device KIT by Does not apply route.    . clonazePAM (KLONOPIN) 0.5 MG tablet TAKE 1 TO 2 TABLETS(0.5 TO 1 MG) BY MOUTH AT BEDTIME AS NEEDED FOR RESTLESS LEGS 60 tablet 0  . ferrous sulfate 325 (65 FE) MG tablet Take 1 tablet (325 mg total) by mouth 2 (two) times daily. 60 tablet 3  . glucose blood (ACCU-CHEK GUIDE) test strip 1 each by Other route as needed for other. Use as instructed    . lisinopril-hydrochlorothiazide (ZESTORETIC) 20-12.5 MG tablet Take 2  tablets by mouth daily. 180 tablet 3  . metFORMIN (GLUCOPHAGE-XR) 500 MG 24 hr tablet TAKE 1 TABLET(500 MG) BY MOUTH DAILY WITH BREAKFAST 30 tablet 11  . omeprazole (PRILOSEC) 40 MG capsule TAKE 1 CAPSULE(40 MG) BY MOUTH TWICE DAILY BEFORE A MEAL 180 capsule 3  . simvastatin (ZOCOR) 80 MG tablet TAKE 1 TABLET(80 MG) BY MOUTH DAILY 90 tablet 3  . vitamin B-12 (CYANOCOBALAMIN) 1000 MCG tablet Take 1,000 mcg by mouth daily.     No current facility-administered medications on file prior to visit.    Allergies  Allergen Reactions  . Nifedipine     REACTION: cough  . Metformin Other (See Comments)    REACTION: diarrhea when dosage is increased, ok when taking one tablet daily, patient aware that she is on metformin when it is listed that she is allergic    Past Medical History:  Diagnosis Date  . Anemia   . ARF (acute renal failure) ([place])   . Blood transfusion without reported diagnosis   . Diabetes mellitus type II   . GI (gastrointestinal bleed)   . GI AVM (gastrointestinal arteriovenous vascular malformation)    stomach  . History of echocardiogram 12/2013   a. 12/2013: EF 60-65%, DD, mild LVH, nl RV size & systolic function, mild MR/TR, mild AS, nl RVSP; b. TTE 04/2017: EF 60-65%, normal wall motion, GR1DD, mild MR, left atrium normal in size, normal  RV systolic function, PASP normal   . History of nuclear stress test    a. 12/2013: low risk, no sig WMA, nondiag EKG 2/2 baseline LVH w/ repol abnl, no sig ischemia, EF 70% no artifact  . HLD (hyperlipidemia)   . HTN (hypertension)   . Hypercholesterolemia   . IDA (iron deficiency anemia)   . Polyp of colon, adenomatous   . RLS (restless legs syndrome)     Past Surgical History:  Procedure Laterality Date  . ABDOMINAL ADHESION SURGERY  11/2001   Dr. Tamala Julian  . ABDOMINAL HYSTERECTOMY  1976   RSO (fibroid)  . COLONOSCOPY WITH PROPOFOL N/A 07/07/2016   Procedure: COLONOSCOPY WITH PROPOFOL;  Surgeon: Manya Silvas, MD;  Location: [facility]  ENDOSCOPY;  Service: Endoscopy;  Laterality: N/A;  . COLONOSCOPY WITH PROPOFOL N/A 12/14/2016   Procedure: COLONOSCOPY WITH PROPOFOL;  Surgeon: Manya Silvas, MD;  Location: [facility] ENDOSCOPY;  Service: Endoscopy;  Laterality: N/A;  . DOBUTAMINE STRESS ECHO  11/10   normal  . DOPPLER ECHOCARDIOGRAPHY     EF 55%, mild MR, TR  . ENTEROSCOPY N/A 06/08/2018   Procedure: ENTEROSCOPY;  Surgeon: Lin Landsman, MD;  Location: [facility] ENDOSCOPY;  Service: Gastroenterology;  Laterality: N/A;  . ENTEROSCOPY N/A 01/06/2020   Procedure: ENTEROSCOPY;  Surgeon: Lin Landsman, MD;  Location: [facility] ENDOSCOPY;  Service: Gastroenterology;  Laterality: N/A;  . ESOPHAGOGASTRODUODENOSCOPY  07/2006   multiple angioectasias  . ESOPHAGOGASTRODUODENOSCOPY Left 03/12/2015   Procedure: place tube in throat and evaluate stomach and duodenum for source of bleeding. Cauterize if concern for rebleeding.;  Surgeon: Hulen Luster, MD;  Location: [facility] ENDOSCOPY;  Service: Endoscopy;  Laterality: Left;  . ESOPHAGOGASTRODUODENOSCOPY N/A 12/14/2016   Procedure: ESOPHAGOGASTRODUODENOSCOPY (EGD);  Surgeon: Manya Silvas, MD;  Location[facility] ENDOSCOPY;  Service: Endoscopy;  Laterality: N/A;  . ESOPHAGOGASTRODUODENOSCOPY N/A 07/19/2017   Procedure: ESOPHAGOGASTRODUODENOSCOPY (EGD);  Surgeon: Lin Landsman, MD;  Location: [facility] ENDOSCOPY;  Service: Gastroenterology;  Laterality: N/A;  . ESOPHAGOGASTRODUODENOSCOPY (EGD) WITH PROPOFOL N/A 11/23/2015   Procedure: ESOPHAGOGASTRODUODENOSCOPY (EGD) WITH PROPOFOL;  Surgeon: Hulen Luster, MD;  Location: [facility] ENDOSCOPY;  Service: Gastroenterology;  Laterality: N/A;  . ESOPHAGOGASTRODUODENOSCOPY (EGD) WITH PROPOFOL N/A 07/07/2016   Procedure: ESOPHAGOGASTRODUODENOSCOPY (EGD) WITH PROPOFOL;  Surgeon: Manya Silvas, MD;  Location: [facility] ENDOSCOPY;  Service: Endoscopy;  Laterality: N/A;  . ESOPHAGOGASTRODUODENOSCOPY (EGD) WITH PROPOFOL N/A 05/19/2017   Procedure: ESOPHAGOGASTRODUODENOSCOPY (EGD)  WITH PROPOFOL;  Surgeon: Manya Silvas, MD;  Location: [facility] ENDOSCOPY;  Service: Endoscopy;  Laterality: N/A;  . laryngeal polyp  10/2008   Dr. Tami Ribas  . TOP      Family History  Problem Relation Age of Onset  . Stroke Father   . Cancer Sister        breast cancer  . Hypertension Mother   . Hypertension Other        sibling  . Diabetes Other        grandmother    Social History   Socioeconomic History  . Marital status: Widowed    Spouse name: Not on file  . Number of children: 1  . Years of education: Not on file  . Highest education level: Not on file  Occupational History  . Occupation: Regulatory affairs officer    Comment: retired  Tobacco Use  . Smoking status: Current Every Day Smoker    Packs/day: 1.00    Types: Cigarettes  . Smokeless tobacco: Never Used  Substance and Sexual Activity  . Alcohol use: No  .  Drug use: No  . Sexual activity: Not on file  Other Topics Concern  . Not on file  Social History Narrative   Lives with sister      No living will   Requests son [employer]son [employer] as health care POA   Would accept resuscitation --but no prolonged machines   Not sure about feeding tubes   Social Determinants of Health   Financial Resource Strain:   . Difficulty of Paying Living Expenses:   Food Insecurity:   . Worried About Charity fundraiser in the Last Year:   . Arboriculturist in the Last Year:   Transportation Needs:   . Film/video editor (Medical):   Marland Kitchen Lack of Transportation (Non-Medical):   Physical Activity:   . Days of Exercise per Week:   . Minutes of Exercise per Session:   Stress:   . Feeling of Stress :   Social Connections:   . Frequency of Communication with Friends and Family:   . Frequency of Social Gatherings with Friends and Family:   . Attends Religious Services:   . Active Member of Clubs or Organizations:   . Attends Archivist Meetings:   Marland Kitchen Marital Status:   Intimate Partner Violence:   . Fear of Current or  Ex-Partner:   . Emotionally Abused:   Marland Kitchen Physically Abused:   . Sexually Abused:    Review of Systems Sleeping okay Bowels are fine--no black stool    Objective:   Physical Exam  Constitutional: She appears well-developed. No distress.  Neck: No thyromegaly present.  Cardiovascular: Normal rate, regular rhythm and normal heart sounds. Exam reveals no gallop.  No murmur heard. Respiratory: Effort normal and breath sounds normal. No respiratory distress. She has no wheezes. She has no rales.  GI: Soft. She exhibits no distension. There is no abdominal tenderness. There is no rebound and no guarding.  Musculoskeletal:        General: No edema.  Lymphadenopathy:    She has no cervical adenopathy.           Assessment & Plan:

## 2020-02-20 NOTE — Assessment & Plan Note (Signed)
Chronic GI blood loss---recurrent for years despite interventions

## 2020-02-20 NOTE — Assessment & Plan Note (Signed)
Iron stores better but she always recurs Will have her restart iron

## 2020-02-21 LAB — RENAL FUNCTION PANEL
Albumin: 4.4 g/dL (ref 3.5–5.2)
BUN: 25 mg/dL — ABNORMAL HIGH (ref 6–23)
CO2: 26 mEq/L (ref 19–32)
Calcium: 10.3 mg/dL (ref 8.4–10.5)
Chloride: 105 mEq/L (ref 96–112)
Creatinine, Ser: 1.07 mg/dL (ref 0.40–1.20)
GFR: 60.01 mL/min (ref >60.00)
Glucose, Bld: 90 mg/dL (ref 70–99)
Phosphorus: 3.5 mg/dL (ref 2.3–4.6)
Potassium: 4.4 mEq/L (ref 3.5–5.1)
Sodium: 140 mEq/L (ref 135–145)

## 2020-02-21 LAB — CBC
HCT: 33 % — ABNORMAL LOW (ref 36.0–46.0)
Hemoglobin: 11 g/dL — ABNORMAL LOW (ref 12.0–15.0)
MCHC: 33.4 g/dL (ref 30.0–36.0)
MCV: 83.7 fl (ref 78.0–100.0)
Platelets: 256 10*3/uL (ref 150.0–400.0)
RBC: 3.95 Mil/uL (ref 3.87–5.11)
RDW: 15.1 % (ref 11.5–15.5)
WBC: 5.4 10*3/uL (ref 4.0–10.5)

## 2020-03-09 ENCOUNTER — Other Ambulatory Visit: Payer: Self-pay | Admitting: Internal Medicine

## 2020-03-09 NOTE — Telephone Encounter (Signed)
Last filled 02-05-20 #60 Last OV 02-20-20 Next OV 10-05-20 [facility]

## 2020-04-09 ENCOUNTER — Other Ambulatory Visit: Payer: Self-pay | Admitting: Internal Medicine

## 2020-04-09 ENCOUNTER — Telehealth: Payer: Self-pay

## 2020-04-09 MED ORDER — CLONAZEPAM 0.5 MG PO TABS: ORAL_TABLET | ORAL | 0 refills | Status: DC

## 2020-04-09 NOTE — Telephone Encounter (Signed)
Pharmacy listed is [facility].

## 2020-04-09 NOTE — Telephone Encounter (Signed)
Last filled 03-09-20 #60 Last OV 02-20-20 Next OV 10-05-20 [facility]

## 2020-04-09 NOTE — Telephone Encounter (Signed)
Pt calling for Clonazepam Rx  [facility] in [place]  Pt states that she is completely out of her medication and needs this refilled today.   Please advise, thanks.

## 2020-04-09 NOTE — Telephone Encounter (Signed)
Pt left v/m" requesting all her numbers for prescriptions to go to [facility] so pt will not have to go to the trouble she has been thru before."

## 2020-04-26 ENCOUNTER — Other Ambulatory Visit: Payer: Self-pay | Admitting: Internal Medicine

## 2020-05-07 ENCOUNTER — Other Ambulatory Visit: Payer: Self-pay | Admitting: Internal Medicine

## 2020-05-07 NOTE — Telephone Encounter (Signed)
Pt called triage to request a refill of med. I can see pharmacy already sent over request for Klonopin.  Last filled 04-09-20 #60 Last OV 02-20-20 Next OV 10-05-20 [facility]

## 2020-06-08 ENCOUNTER — Other Ambulatory Visit: Payer: Self-pay | Admitting: Internal Medicine

## 2020-06-08 NOTE — Telephone Encounter (Signed)
[place] Night - Client TELEPHONE ADVICE RECORD [facility] Patient Name: Kathryn Ware Gender: Female DOB: 1941/05/11 Age: 79 Y 3 M 24 D Return Phone Number: [phone] (Primary) Address: City/State/Zip: [place] [address] Client [facility] [place] Night - Client Client Site [place] Physician Viviana Simpler- MD Contact Type Call Who Is Calling Patient / Member / Family / Caregiver Call Type Triage / Clinical Relationship To Patient Self Return Phone Number [phone] (Primary) Chief Complaint Prescription Refill or Medication Request (non symptomatic) Reason for Call Symptomatic / Request for [name] is requesting a refill on her Clonazepam. Caller reports she will be out on Monday and needs them monday morning. Translation No Nurse Assessment Nurse: Kenton Kingfisher, RN, [employer] Date/Time (Eastern Time): 06/06/2020 11:06:59 AM Confirm and document reason for call. If symptomatic, describe symptoms. ---Caller states she is going to be out of her Clonazepam on Monday morning and is wanting a refill called in to her pharmacy. Patient takes the medication for restless leg syndrome. Denies any new or worsening symptoms. Has the patient had close contact with a person known or suspected to have the novel coronavirus illness OR traveled / lives in area with major community spread (including international travel) in the last 14 days from the onset of symptoms? * If Asymptomatic, screen for exposure and travel within the last 14 days. ---No Does the patient have any new or worsening symptoms? ---No Disp. Time Eilene Ghazi Time) Disposition Final User 06/06/2020 11:11:10 AM Clinical Call Yes Kenton Kingfisher, RN, [employer] Comments User: Marveen Reeks, RN Date/Time Eilene Ghazi Time): 06/06/2020 11:10:58 AM Caller advised to call the office on Monday morning to request a refill. Patient will have enough  medication to get through the weekend.

## 2020-06-08 NOTE — Telephone Encounter (Signed)
Name of Medication: clonazepam 0.5 mg Name of Pharmacy:[facility]  Last Fill or Written Date and Quantity:# 62 on 05/07/20  Last Office Visit and Type:02/20/20 [facility] & 09/12/19 [place]  Next Office Visit and Type:10/05/20 [place]   Pt left v/m that she is out of med and request cb when refilled.

## 2020-06-09 ENCOUNTER — Other Ambulatory Visit: Payer: Self-pay | Admitting: Internal Medicine

## 2020-06-16 ENCOUNTER — Ambulatory Visit: Payer: Medicare HMO | Admitting: Gastroenterology

## 2020-06-24 ENCOUNTER — Inpatient Hospital Stay
Admission: EM | Admit: 2020-06-24 | Discharge: 2020-06-26 | DRG: 378 | Disposition: A | Discharge status: Home / Self Care | Payer: Medicare HMO | Attending: Internal Medicine | Admitting: Internal Medicine

## 2020-06-24 ENCOUNTER — Other Ambulatory Visit: Payer: Self-pay

## 2020-06-24 DIAGNOSIS — D509 Iron deficiency anemia, unspecified: Secondary | ICD-10-CM | POA: Diagnosis present

## 2020-06-24 DIAGNOSIS — Z833 Family history of diabetes mellitus: Secondary | ICD-10-CM

## 2020-06-24 DIAGNOSIS — K921 Melena: Secondary | ICD-10-CM | POA: Diagnosis not present

## 2020-06-24 DIAGNOSIS — K219 Gastro-esophageal reflux disease without esophagitis: Secondary | ICD-10-CM | POA: Diagnosis not present

## 2020-06-24 DIAGNOSIS — Z90710 Acquired absence of both cervix and uterus: Secondary | ICD-10-CM

## 2020-06-24 DIAGNOSIS — I499 Cardiac arrhythmia, unspecified: Secondary | ICD-10-CM | POA: Diagnosis not present

## 2020-06-24 DIAGNOSIS — Z888 Allergy status to other drugs, medicaments and biological substances status: Secondary | ICD-10-CM | POA: Diagnosis not present

## 2020-06-24 DIAGNOSIS — E785 Hyperlipidemia, unspecified: Secondary | ICD-10-CM | POA: Diagnosis not present

## 2020-06-24 DIAGNOSIS — K2971 Gastritis, unspecified, with bleeding: Secondary | ICD-10-CM | POA: Diagnosis not present

## 2020-06-24 DIAGNOSIS — E7800 Pure hypercholesterolemia, unspecified: Secondary | ICD-10-CM | POA: Diagnosis present

## 2020-06-24 DIAGNOSIS — Z20822 Contact with and (suspected) exposure to covid-19: Secondary | ICD-10-CM | POA: Diagnosis not present

## 2020-06-24 DIAGNOSIS — E1122 Type 2 diabetes mellitus with diabetic chronic kidney disease: Secondary | ICD-10-CM | POA: Diagnosis not present

## 2020-06-24 DIAGNOSIS — K31811 Angiodysplasia of stomach and duodenum with bleeding: Secondary | ICD-10-CM | POA: Diagnosis present

## 2020-06-24 DIAGNOSIS — Z8679 Personal history of other diseases of the circulatory system: Secondary | ICD-10-CM | POA: Diagnosis not present

## 2020-06-24 DIAGNOSIS — K922 Gastrointestinal hemorrhage, unspecified: Secondary | ICD-10-CM | POA: Diagnosis not present

## 2020-06-24 DIAGNOSIS — R636 Underweight: Secondary | ICD-10-CM | POA: Diagnosis not present

## 2020-06-24 DIAGNOSIS — E119 Type 2 diabetes mellitus without complications: Secondary | ICD-10-CM | POA: Diagnosis present

## 2020-06-24 DIAGNOSIS — D649 Anemia, unspecified: Secondary | ICD-10-CM

## 2020-06-24 DIAGNOSIS — F17200 Nicotine dependence, unspecified, uncomplicated: Secondary | ICD-10-CM | POA: Diagnosis present

## 2020-06-24 DIAGNOSIS — Z803 Family history of malignant neoplasm of breast: Secondary | ICD-10-CM | POA: Diagnosis not present

## 2020-06-24 DIAGNOSIS — R9431 Abnormal electrocardiogram [ECG] [EKG]: Secondary | ICD-10-CM | POA: Diagnosis present

## 2020-06-24 DIAGNOSIS — Z8719 Personal history of other diseases of the digestive system: Secondary | ICD-10-CM

## 2020-06-24 DIAGNOSIS — D62 Acute posthemorrhagic anemia: Secondary | ICD-10-CM | POA: Diagnosis not present

## 2020-06-24 DIAGNOSIS — Z79899 Other long term (current) drug therapy: Secondary | ICD-10-CM

## 2020-06-24 DIAGNOSIS — I5032 Chronic diastolic (congestive) heart failure: Secondary | ICD-10-CM | POA: Diagnosis not present

## 2020-06-24 DIAGNOSIS — I10 Essential (primary) hypertension: Secondary | ICD-10-CM | POA: Diagnosis not present

## 2020-06-24 DIAGNOSIS — Z8249 Family history of ischemic heart disease and other diseases of the circulatory system: Secondary | ICD-10-CM

## 2020-06-24 DIAGNOSIS — R6889 Other general symptoms and signs: Secondary | ICD-10-CM | POA: Diagnosis not present

## 2020-06-24 DIAGNOSIS — Z823 Family history of stroke: Secondary | ICD-10-CM | POA: Diagnosis not present

## 2020-06-24 DIAGNOSIS — Z681 Body mass index (BMI) 19 or less, adult: Secondary | ICD-10-CM | POA: Diagnosis not present

## 2020-06-24 DIAGNOSIS — I959 Hypotension, unspecified: Secondary | ICD-10-CM | POA: Diagnosis not present

## 2020-06-24 DIAGNOSIS — Z7984 Long term (current) use of oral hypoglycemic drugs: Secondary | ICD-10-CM

## 2020-06-24 DIAGNOSIS — E1169 Type 2 diabetes mellitus with other specified complication: Secondary | ICD-10-CM | POA: Diagnosis not present

## 2020-06-24 DIAGNOSIS — N183 Chronic kidney disease, stage 3 unspecified: Secondary | ICD-10-CM | POA: Diagnosis not present

## 2020-06-24 DIAGNOSIS — R000 Tachycardia, unspecified: Secondary | ICD-10-CM | POA: Diagnosis not present

## 2020-06-24 DIAGNOSIS — R531 Weakness: Secondary | ICD-10-CM | POA: Diagnosis not present

## 2020-06-24 DIAGNOSIS — Z743 Need for continuous supervision: Secondary | ICD-10-CM | POA: Diagnosis not present

## 2020-06-24 DIAGNOSIS — I130 Hypertensive heart and chronic kidney disease with heart failure and stage 1 through stage 4 chronic kidney disease, or unspecified chronic kidney disease: Secondary | ICD-10-CM | POA: Diagnosis not present

## 2020-06-24 LAB — PREPARE RBC (CROSSMATCH)

## 2020-06-24 LAB — BASIC METABOLIC PANEL
Anion gap: 13 (ref 5–15)
BUN: 41 mg/dL — ABNORMAL HIGH (ref 8–23)
CO2: 19 mmol/L — ABNORMAL LOW (ref 22–32)
Calcium: 10.2 mg/dL (ref 8.9–10.3)
Chloride: 109 mmol/L (ref 98–111)
Creatinine, Ser: 1.25 mg/dL — ABNORMAL HIGH (ref 0.44–1.00)
GFR calc Af Amer: 48 mL/min — ABNORMAL LOW (ref >60)
GFR calc non Af Amer: 41 mL/min — ABNORMAL LOW (ref >60)
Glucose, Bld: 151 mg/dL — ABNORMAL HIGH (ref 70–99)
Potassium: 4.6 mmol/L (ref 3.5–5.1)
Sodium: 141 mmol/L (ref 135–145)

## 2020-06-24 LAB — CBC
HCT: 18 % — ABNORMAL LOW (ref 36.0–46.0)
Hemoglobin: 5.2 g/dL — ABNORMAL LOW (ref 12.0–15.0)
MCH: 24.3 pg — ABNORMAL LOW (ref 26.0–34.0)
MCHC: 28.9 g/dL — ABNORMAL LOW (ref 30.0–36.0)
MCV: 84.1 fL (ref 80.0–100.0)
Platelets: 386 10*3/uL (ref 150–400)
RBC: 2.14 MIL/uL — ABNORMAL LOW (ref 3.87–5.11)
RDW: 14.2 % (ref 11.5–15.5)
WBC: 6 10*3/uL (ref 4.0–10.5)
nRBC: 0 % (ref 0.0–0.2)

## 2020-06-24 LAB — SARS CORONAVIRUS 2 BY RT PCR (HOSPITAL ORDER, PERFORMED IN ~~LOC~~ HOSPITAL LAB): SARS Coronavirus 2: NEGATIVE

## 2020-06-24 MED ORDER — SODIUM CHLORIDE 0.9 % IV SOLN
8.0000 mg/h | INTRAVENOUS | Status: DC
  Administered 2020-06-24 – 2020-06-26 (×3): 8 mg/h via INTRAVENOUS
  Filled 2020-06-24 (×4): qty 80

## 2020-06-24 MED ORDER — MAGNESIUM HYDROXIDE 400 MG/5ML PO SUSP: 30.0000 mL | Freq: Every day | ORAL | Status: DC | PRN

## 2020-06-24 MED ORDER — TRAZODONE HCL 50 MG PO TABS: 25.0000 mg | ORAL_TABLET | Freq: Every evening | ORAL | Status: DC | PRN

## 2020-06-24 MED ORDER — PANTOPRAZOLE SODIUM 40 MG IV SOLR: 40.0000 mg | Freq: Two times a day (BID) | INTRAVENOUS | Status: DC

## 2020-06-24 MED ORDER — CLONAZEPAM 0.5 MG PO TABS
0.5000 mg | ORAL_TABLET | Freq: Every evening | ORAL | Status: DC | PRN
  Administered 2020-06-25 (×2): 1 mg via ORAL
  Filled 2020-06-24 (×2): qty 2

## 2020-06-24 MED ORDER — SODIUM CHLORIDE 0.9 % IV SOLN: INTRAVENOUS | Status: DC

## 2020-06-24 MED ORDER — ALBUTEROL SULFATE (2.5 MG/3ML) 0.083% IN NEBU: 3.0000 mL | INHALATION_SOLUTION | RESPIRATORY_TRACT | Status: DC | PRN

## 2020-06-24 MED ORDER — ACETAMINOPHEN 650 MG RE SUPP: 650.0000 mg | Freq: Four times a day (QID) | RECTAL | Status: DC | PRN

## 2020-06-24 MED ORDER — FERROUS SULFATE 325 (65 FE) MG PO TABS
325.0000 mg | ORAL_TABLET | Freq: Two times a day (BID) | ORAL | Status: DC
  Administered 2020-06-25 (×2): 325 mg via ORAL
  Filled 2020-06-24 (×3): qty 1

## 2020-06-24 MED ORDER — ONDANSETRON HCL 4 MG/2ML IJ SOLN: 4.0000 mg | Freq: Four times a day (QID) | INTRAMUSCULAR | Status: DC | PRN

## 2020-06-24 MED ORDER — ATORVASTATIN CALCIUM 20 MG PO TABS
40.0000 mg | ORAL_TABLET | Freq: Every day | ORAL | Status: DC
  Administered 2020-06-25: 40 mg via ORAL
  Filled 2020-06-24: qty 2

## 2020-06-24 MED ORDER — PANTOPRAZOLE SODIUM 40 MG PO TBEC: 40.0000 mg | DELAYED_RELEASE_TABLET | Freq: Every day | ORAL | Status: DC

## 2020-06-24 MED ORDER — SODIUM CHLORIDE 0.9 % IV SOLN
80.0000 mg | Freq: Once | INTRAVENOUS | Status: AC
  Administered 2020-06-24: 80 mg via INTRAVENOUS
  Filled 2020-06-24: qty 80

## 2020-06-24 MED ORDER — SODIUM CHLORIDE 0.9 % IV SOLN: 10.0000 mL/h | Freq: Once | INTRAVENOUS | Status: DC

## 2020-06-24 MED ORDER — ACETAMINOPHEN 325 MG PO TABS: 650.0000 mg | ORAL_TABLET | Freq: Four times a day (QID) | ORAL | Status: DC | PRN

## 2020-06-24 MED ORDER — ALBUTEROL SULFATE HFA 108 (90 BASE) MCG/ACT IN AERS
2.0000 | INHALATION_SPRAY | RESPIRATORY_TRACT | Status: DC | PRN
  Filled 2020-06-24: qty 6.7

## 2020-06-24 MED ORDER — ONDANSETRON HCL 4 MG PO TABS: 4.0000 mg | ORAL_TABLET | Freq: Four times a day (QID) | ORAL | Status: DC | PRN

## 2020-06-24 MED ORDER — VITAMIN B-12 1000 MCG PO TABS
1000.0000 ug | ORAL_TABLET | Freq: Every day | ORAL | Status: DC
  Administered 2020-06-25: 1000 ug via ORAL
  Filled 2020-06-24: qty 1

## 2020-06-24 MED ORDER — SODIUM CHLORIDE 0.9 % IV BOLUS
500.0000 mL | Freq: Once | INTRAVENOUS | Status: AC
  Administered 2020-06-24: 500 mL via INTRAVENOUS

## 2020-06-24 MED ORDER — LISINOPRIL-HYDROCHLOROTHIAZIDE 20-12.5 MG PO TABS: 2.0000 | ORAL_TABLET | Freq: Every day | ORAL | Status: DC

## 2020-06-24 NOTE — ED Notes (Signed)
As per provider, Rufina Falco, no need to get repeat hemoglobin and hematocrit till 2nd unit of PRBCs has infused.

## 2020-06-24 NOTE — ED Notes (Signed)
Provider messaged to ask if Hemoglobin and Hematocrit need to be collected now or wait till the 2 units of PRBCs are infused.

## 2020-06-24 NOTE — H&P (Addendum)
[place]   PATIENT NAME: Kathryn Ware    MR#:  [id number]  DATE OF BIRTH:  1941-10-12  DATE OF ADMISSION:  06/24/2020  PRIMARY CARE PHYSICIAN: Venia Carbon, MD   REQUESTING/REFERRING PHYSICIAN: Nance Pear, MD CHIEF COMPLAINT:   Chief Complaint  Patient presents with  . Dizziness    HISTORY OF PRESENT ILLNESS:  Kathryn Ware  is a 79 y.o. African-American female with a known history of diabetes mellitus, hypertension, dyslipidemia and history of GI bleeding and gastric AVMs, who presented to the emergency room with acute onset of generalized weakness as well as dyspnea increasing on exertion and black stools.  She denied any melena or bright red bleeding per rectum.  No abdominal pain or diarrhea or constipation.  She denied any nausea or vomiting or heartburn.  No chest pain or palpitations or cough or wheezing.  No dysuria, oliguria or hematuria or flank pain.  No other bleeding diathesis.  Upon presentation to the emergency room, blood pressure was 88/43 and later 126/44 with a heart rate of 115 and respiratory to 20 and later 25 approximately 99% on room air.  Temperature was 97.6.  Labs revealed hemoglobin of 5.2 hematocrit of 18 down from 11 and 33 on 02/20/2020.  BMP showed a BUN of 41 creatinine 1.251 glucose of 151.  COVID-19 PCR came back negative. EKG showed sinus tachycardia with rate of 119 with no acute findings.  The patient was typed and crossmatched and in order 2 units of packed red blood cells.  She was given 500 mL IV normal saline bolus and 80 mg of IV Protonix bolus followed by IV infusion.  She will be admitted to a progressive unit bed for further evaluation and management.   PAST MEDICAL HISTORY:   Past Medical History:  Diagnosis Date  . Anemia   . ARF (acute renal failure) ([place])   . Blood transfusion without reported diagnosis   . Diabetes mellitus type II   . GI (gastrointestinal bleed)   . GI AVM (gastrointestinal arteriovenous vascular  malformation)    stomach  . History of echocardiogram 12/2013   a. 12/2013: EF 60-65%, DD, mild LVH, nl RV size & systolic function, mild MR/TR, mild AS, nl RVSP; b. TTE 04/2017: EF 60-65%, normal wall motion, GR1DD, mild MR, left atrium normal in size, normal RV systolic function, PASP normal   . History of nuclear stress test    a. 12/2013: low risk, no sig WMA, nondiag EKG 2/2 baseline LVH w/ repol abnl, no sig ischemia, EF 70% no artifact  . HLD (hyperlipidemia)   . HTN (hypertension)   . Hypercholesterolemia   . IDA (iron deficiency anemia)   . Polyp of colon, adenomatous   . RLS (restless legs syndrome)     PAST SURGICAL HISTORY:   Past Surgical History:  Procedure Laterality Date  . ABDOMINAL ADHESION SURGERY  11/2001   Dr. Tamala Julian  . ABDOMINAL HYSTERECTOMY  1976   RSO (fibroid)  . COLONOSCOPY WITH PROPOFOL N/A 07/07/2016   Procedure: COLONOSCOPY WITH PROPOFOL;  Surgeon: Manya Silvas, MD;  Location: [facility] ENDOSCOPY;  Service: Endoscopy;  Laterality: N/A;  . COLONOSCOPY WITH PROPOFOL N/A 12/14/2016   Procedure: COLONOSCOPY WITH PROPOFOL;  Surgeon: Manya Silvas, MD;  Location: [facility] ENDOSCOPY;  Service: Endoscopy;  Laterality: N/A;  . DOBUTAMINE STRESS ECHO  11/10   normal  . DOPPLER ECHOCARDIOGRAPHY     EF 55%, mild MR, TR  . ENTEROSCOPY N/A 06/08/2018  Procedure: ENTEROSCOPY;  Surgeon: Lin Landsman, MD;  Location: [facility] ENDOSCOPY;  Service: Gastroenterology;  Laterality: N/A;  . ENTEROSCOPY N/A 01/06/2020   Procedure: ENTEROSCOPY;  Surgeon: Lin Landsman, MD;  Location: [facility] ENDOSCOPY;  Service: Gastroenterology;  Laterality: N/A;  . ESOPHAGOGASTRODUODENOSCOPY  07/2006   multiple angioectasias  . ESOPHAGOGASTRODUODENOSCOPY Left 03/12/2015   Procedure: place tube in throat and evaluate stomach and duodenum for source of bleeding. Cauterize if concern for rebleeding.;  Surgeon: Hulen Luster, MD;  Location: [facility] ENDOSCOPY;  Service: Endoscopy;  Laterality: Left;  .  ESOPHAGOGASTRODUODENOSCOPY N/A 12/14/2016   Procedure: ESOPHAGOGASTRODUODENOSCOPY (EGD);  Surgeon: Manya Silvas, MD;  Location: [facility] ENDOSCOPY;  Service: Endoscopy;  Laterality: N/A;  . ESOPHAGOGASTRODUODENOSCOPY N/A 07/19/2017   Procedure: ESOPHAGOGASTRODUODENOSCOPY (EGD);  Surgeon: Lin Landsman, MD;  Location: [facility] ENDOSCOPY;  Service: Gastroenterology;  Laterality: N/A;  . ESOPHAGOGASTRODUODENOSCOPY (EGD) WITH PROPOFOL N/A 11/23/2015   Procedure: ESOPHAGOGASTRODUODENOSCOPY (EGD) WITH PROPOFOL;  Surgeon: Hulen Luster, MD;  Location[facility] ENDOSCOPY;  Service: Gastroenterology;  Laterality: N/A;  . ESOPHAGOGASTRODUODENOSCOPY (EGD) WITH PROPOFOL N/A 07/07/2016   Procedure: ESOPHAGOGASTRODUODENOSCOPY (EGD) WITH PROPOFOL;  Surgeon: Manya Silvas, MD;  Location: [facility] ENDOSCOPY;  Service: Endoscopy;  Laterality: N/A;  . ESOPHAGOGASTRODUODENOSCOPY (EGD) WITH PROPOFOL N/A 05/19/2017   Procedure: ESOPHAGOGASTRODUODENOSCOPY (EGD) WITH PROPOFOL;  Surgeon: Manya Silvas, MD;  Location: [facility] ENDOSCOPY;  Service: Endoscopy;  Laterality: N/A;  . laryngeal polyp  10/2008   Dr. Tami Ribas  . TOP      SOCIAL HISTORY:   Social History   Tobacco Use  . Smoking status: Current Every Day Smoker    Packs/day: 1.00    Types: Cigarettes  . Smokeless tobacco: Never Used  Substance Use Topics  . Alcohol use: No    FAMILY HISTORY:   Family History  Problem Relation Age of Onset  . Stroke Father   . Cancer Sister        breast cancer  . Hypertension Mother   . Hypertension Other        sibling  . Diabetes Other        grandmother    DRUG ALLERGIES:   Allergies  Allergen Reactions  . Nifedipine     REACTION: cough  . Metformin Other (See Comments)    REACTION: diarrhea when dosage is increased, ok when taking one tablet daily, patient aware that she is on metformin when it is listed that she is allergic    REVIEW OF SYSTEMS:   ROS As per history of present illness. All pertinent  systems were reviewed above. Constitutional, HEENT, cardiovascular, respiratory, GI, GU, musculoskeletal, neuro, psychiatric, endocrine, integumentary and hematologic systems were reviewed and are otherwise negative/unremarkable except for positive findings mentioned above in the HPI.   MEDICATIONS AT HOME:   Prior to Admission medications   Medication Sig Start Date End Date Taking? Authorizing Provider  Accu-Chek Softclix Lancets lancets  02/01/20   [provider]  acetaminophen (TYLENOL) 325 MG tablet Take 2 tablets (650 mg total) by mouth every 6 (six) hours as needed for mild pain (or Fever >/= 101). 06/09/18   Gouru, Illene Silver, MD  albuterol (VENTOLIN HFA) 108 (90 Base) MCG/ACT inhaler INHALE 2 PUFFS INTO THE LUNGS EVERY 6 HOURS AS NEEDED FOR WHEEZING OR SHORTNESS OF BREATH 06/09/20   Viviana Simpler I, MD  Blood Glucose Monitoring Suppl (ACCU-CHEK GUIDE) w/Device KIT by Does not apply route.    [provider]  clonazePAM (KLONOPIN) 0.5 MG tablet TAKE 1 TO 2  TABLETS(0.5 TO 1 MG) BY MOUTH AT BEDTIME AS NEEDED FOR RESTLESS LEGS 06/08/20   Viviana Simpler I, MD  ferrous sulfate 325 (65 FE) MG tablet Take 1 tablet (325 mg total) by mouth 2 (two) times daily. 06/09/18   Gouru, Illene Silver, MD  glucose blood (ACCU-CHEK GUIDE) test strip 1 each by Other route as needed for other. Use as instructed    [provider]  lisinopril-hydrochlorothiazide (ZESTORETIC) 20-12.5 MG tablet Take 2 tablets by mouth daily. 11/04/19   Venia Carbon, MD  metFORMIN (GLUCOPHAGE-XR) 500 MG 24 hr tablet TAKE 1 TABLET(500 MG) BY MOUTH DAILY WITH BREAKFAST 02/05/20   Viviana Simpler I, MD  omeprazole (PRILOSEC) 40 MG capsule TAKE 1 CAPSULE(40 MG) BY MOUTH TWICE DAILY BEFORE A MEAL 08/21/17   Viviana Simpler I, MD  simvastatin (ZOCOR) 80 MG tablet TAKE 1 TABLET(80 MG) BY MOUTH DAILY 10/23/19   Venia Carbon, MD  vitamin B-12 (CYANOCOBALAMIN) 1000 MCG tablet Take 1,000 mcg by mouth daily.    [provider]      VITAL SIGNS:  Blood pressure (!) 126/44, pulse (!) 102, temperature 97.6 F (36.4 C), temperature source Oral, resp. rate (!) 25, height 5' 4" (1.626 m), weight 50.8 kg, SpO2 100 %.  PHYSICAL EXAMINATION:  Physical Exam  GENERAL:  79 y.o.-year-old African-American patient lying in the bed with mild conversational dyspnea. EYES: Pupils equal, round, reactive to light and accommodation. No scleral icterus.  Positive pallor.  Extraocular muscles intact.  HEENT: Head atraumatic, normocephalic. Oropharynx and nasopharynx clear.  NECK:  Supple, no jugular venous distention. No thyroid enlargement, no tenderness.  LUNGS: Normal breath sounds bilaterally, no wheezing, rales,rhonchi or crepitation. No use of accessory muscles of respiration.  CARDIOVASCULAR: Regular rate and rhythm, S1, S2 normal. No murmurs, rubs, or gallops.  ABDOMEN: Soft, nondistended, nontender. Bowel sounds present. No organomegaly or mass.  EXTREMITIES: No pedal edema, cyanosis, or clubbing.  NEUROLOGIC: Cranial nerves II through XII are intact. Muscle strength 5/5 in all extremities. Sensation intact. Gait not checked.  PSYCHIATRIC: The patient is alert and oriented x 3.  Normal affect and good eye contact. SKIN: No obvious rash, lesion, or ulcer.   LABORATORY PANEL:   CBC Recent Labs  Lab 06/24/20 1702  WBC 6.0  HGB 5.2*  HCT 18.0*  PLT 386   ------------------------------------------------------------------------------------------------------------------  Chemistries  Recent Labs  Lab 06/24/20 1702  NA 141  K 4.6  CL 109  CO2 19*  GLUCOSE 151*  BUN 41*  CREATININE 1.25*  CALCIUM 10.2   ------------------------------------------------------------------------------------------------------------------  Cardiac Enzymes No results for input(s): TROPONINI in the last 168  hours. ------------------------------------------------------------------------------------------------------------------  RADIOLOGY:  No results found.    IMPRESSION AND PLAN:   1.  Acute blood loss symptomatic anemia secondary to GI bleeding, with associated generalized weakness and hypotension. -The patient will be admitted to a progressive unit bed. -We will follow serial hemoglobins and hematocrits. -We will transfuse 2 units of packed red blood cells. -We will follow posttransfusion H&H. -GI consultation will be obtained. -I notified Dr. Alice Reichert about the patient. -We will continue her ferrous sulfate.  2.  History of hypertension. -We will continue her antihypertensives with holding parameters for current hypotension.  3.  Type 2 diabetes mellitus. -The patient will be placed on supplement coverage with NovoLog. -We will hold Metformin for now.  4.  Dyslipidemia. -We will continue statin therapy.  5.  DVT prophylaxis. -SCDs. -Medical prophylaxis currently contraindicated due to GI bleeding.   All  the records are reviewed and case discussed with ED provider. The plan of care was discussed in details with the patient (and family). I answered all questions. The patient agreed to proceed with the above mentioned plan. Further management will depend upon hospital course.   CODE STATUS: Full code  Status is: Inpatient  Remains inpatient appropriate because:Hemodynamically unstable, Ongoing diagnostic testing needed not appropriate for outpatient work up, Unsafe d/c plan, IV treatments appropriate due to intensity of illness or inability to take PO and Inpatient level of care appropriate due to severity of illness   Dispo: The patient is from: Home              Anticipated d/c is to: Home              Anticipated d/c date is: 2 days              Patient currently is not medically stable to d/c.   TOTAL TIME TAKING CARE OF THIS PATIENT: 55 minutes.    Christel Mormon  M.D on 06/24/2020 at 6:24 PM  [facility]   From 7 PM-7 AM, contact night-coverage [email or web address]  CC: Primary care physician; Venia Carbon, MD   Note: This dictation was prepared with Dragon dictation along with smaller phrase technology. Any transcriptional typo errors that result from this process are unintentional.

## 2020-06-24 NOTE — ED Notes (Signed)
Purewick placed on pt by this tech. Pt comfortable and resting at this time.

## 2020-06-24 NOTE — ED Notes (Signed)
Pharmacy called regarding continuous protonix drip for patient, spoke with Cristie Hem, states will send.

## 2020-06-24 NOTE — ED Notes (Signed)
Pt in room getting blood infusion. Rate at 131mL/hr. Pt denies pain, but states coming in for weakness and a blood transfusion. Pt states history of blood transfusions.

## 2020-06-24 NOTE — ED Provider Notes (Signed)
[facility] Emergency Department Provider Note   ____________________________________________   I have reviewed the triage vital signs and the nursing notes.   HISTORY  Chief Complaint Weakness  History limited by: Not Limited   HPI Kathryn Ware is a 79 y.o. female who presents to the emergency department today because of concern for weakness. The patient has felt weak for the past three weeks. It has been fairly constant. Not necessarily getting worse however she felt like it should get better. She has had associated shortness of breath with the weakness. She says she has had anemia in the past and that this reminds her of the way she felt when she has required a transfusion. The patient denies any fevers. She denies any known sick contacts.    Records reviewed. Per medical record review patient has a history of blood transfusion, GI bleed.   Past Medical History:  Diagnosis Date  . Anemia   . ARF (acute renal failure) ([place])   . Blood transfusion without reported diagnosis   . Diabetes mellitus type II   . GI (gastrointestinal bleed)   . GI AVM (gastrointestinal arteriovenous vascular malformation)    stomach  . History of echocardiogram 12/2013   a. 12/2013: EF 60-65%, DD, mild LVH, nl RV size & systolic function, mild MR/TR, mild AS, nl RVSP; b. TTE 04/2017: EF 60-65%, normal wall motion, GR1DD, mild MR, left atrium normal in size, normal RV systolic function, PASP normal   . History of nuclear stress test    a. 12/2013: low risk, no sig WMA, nondiag EKG 2/2 baseline LVH w/ repol abnl, no sig ischemia, EF 70% no artifact  . HLD (hyperlipidemia)   . HTN (hypertension)   . Hypercholesterolemia   . IDA (iron deficiency anemia)   . Polyp of colon, adenomatous   . RLS (restless legs syndrome)     Patient Active Problem List   Diagnosis Date Noted  . Symptomatic anemia 01/05/2020  . HLD (hyperlipidemia) 01/05/2020  . GERD (gastroesophageal reflux  disease) 01/05/2020  . Anxiety 01/05/2020  . Chronic renal disease, stage III 01/05/2020  . Tobacco abuse 01/05/2020  . Syncope 01/05/2020  . GIB (gastrointestinal bleeding) 01/05/2020  . Peripheral arterial disease ([place]) 09/12/2019  . Chronic bronchitis ([place]) 12/20/2018  . Aortic atherosclerosis ([place]) 12/19/2017  . Chronic diastolic CHF (congestive heart failure) ([place]) 05/31/2017  . Malnutrition of mild degree ([place]) 03/30/2017  . History of adenomatous polyp of colon 09/19/2016  . Anemia 07/05/2016  . Restless legs syndrome (RLS) 02/23/2016  . 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 Congenital gastrointestinal vessel anomaly 03/19/2015  . Anemia due to gastrointestinal blood loss 02/18/2015  . Advance directive discussed with patient 02/18/2015  . Nicotine dependence 10/29/2013  . Routine general medical examination at a health care facility 08/17/2011  . Mild cognitive impairment 08/17/2011  . Type II diabetes mellitus with renal manifestations ([place]) 02/02/2007  . HYPERCHOLESTEROLEMIA 02/02/2007  . HTN (hypertension) 02/02/2007  . Angiodysplasia of intestinal tract 02/02/2007  . Benign essential hypertension 02/02/2007    Past Surgical History:  Procedure Laterality Date  . ABDOMINAL ADHESION SURGERY  11/2001   Dr. Tamala Julian  . ABDOMINAL HYSTERECTOMY  1976   RSO (fibroid)  . COLONOSCOPY WITH PROPOFOL N/A 07/07/2016   Procedure: COLONOSCOPY WITH PROPOFOL;  Surgeon: Manya Silvas, MD;  Location: [facility] ENDOSCOPY;  Service: Endoscopy;  Laterality: N/A;  . COLONOSCOPY WITH PROPOFOL N/A 12/14/2016   Procedure: COLONOSCOPY WITH PROPOFOL;  Surgeon: Manya Silvas, MD;  Location: [facility] ENDOSCOPY;  Service: Endoscopy;  Laterality: N/A;  . DOBUTAMINE STRESS ECHO  11/10   normal  . DOPPLER ECHOCARDIOGRAPHY     EF 55%, mild MR, TR  . ENTEROSCOPY N/A 06/08/2018   Procedure: ENTEROSCOPY;  Surgeon: Lin Landsman, MD;  Location: [facility] ENDOSCOPY;  Service: Gastroenterology;   Laterality: N/A;  . ENTEROSCOPY N/A 01/06/2020   Procedure: ENTEROSCOPY;  Surgeon: Lin Landsman, MD;  Location: [facility] ENDOSCOPY;  Service: Gastroenterology;  Laterality: N/A;  . ESOPHAGOGASTRODUODENOSCOPY  07/2006   multiple angioectasias  . ESOPHAGOGASTRODUODENOSCOPY Left 03/12/2015   Procedure: place tube in throat and evaluate stomach and duodenum for source of bleeding. Cauterize if concern for rebleeding.;  Surgeon: Hulen Luster, MD;  Location: [facility] ENDOSCOPY;  Service: Endoscopy;  Laterality: Left;  . ESOPHAGOGASTRODUODENOSCOPY N/A 12/14/2016   Procedure: ESOPHAGOGASTRODUODENOSCOPY (EGD);  Surgeon: Manya Silvas, MD;  Location: [facility] END[facility]COPY;  Service: Endoscopy;  Laterality: N/A;  . ESOPHAGOGASTRODUODENOSCOPY N/A 07/19/2017   Procedure: ESOPHAGOGASTRODUODENOSCOPY (EGD);  Surgeon: Lin Landsman, MD;  Location: [facility] ENDOSCOPY;  Service: Gastroenterology;  Laterality: N/A;  . ESOPHAGOGASTRODUODENOSCOPY (EGD) WITH PROPOFOL N/A 11/23/2015   Procedure: ESOPHAGOGASTRODUODENOSCOPY (EGD) WITH PROPOFOL;  Surgeon: Hulen Luster, MD;  Location: [facility] ENDOSCOPY;  Service: Gastroenterology;  Laterality: N/A;  . ESOPHAGOGASTRODUODENOSCOPY (EGD) WITH PROPOFOL N/A 07/07/2016   Procedure: ESOPHAGOGASTRODUODENOSCOPY (EGD) WITH PROPOFOL;  Surgeon: Manya Silvas, MD;  Location: [facility] ENDOSCOPY;  Service: Endoscopy;  Laterality: N/A;  . ESOPHAGOGASTRODUODENOSCOPY (EGD) WITH PROPOFOL N/A 05/19/2017   Procedure: ESOPHAGOGASTRODUODENOSCOPY (EGD) WITH PROPOFOL;  Surgeon: Manya Silvas, MD;  Location: [facility] ENDOSCOPY;  Service: Endoscopy;  Laterality: N/A;  . laryngeal polyp  10/2008   Dr. Tami Ribas  . TOP      Prior to Admission medications   Medication Sig Start Date End Date Taking? Authorizing Provider  Accu-Chek Softclix Lancets lancets  02/01/20   [provider]  acetaminophen (TYLENOL) 325 MG tablet Take 2 tablets (650 mg total) by mouth every 6 (six) hours as needed for mild pain (or Fever  >/= 101). 06/09/18   Gouru, Illene Silver, MD  albuterol (VENTOLIN HFA) 108 (90 Base) MCG/ACT inhaler INHALE 2 PUFFS INTO THE LUNGS EVERY 6 HOURS AS NEEDED FOR WHEEZING OR SHORTNESS OF BREATH 06/09/20   Viviana Simpler I, MD  Blood Glucose Monitoring Suppl (ACCU-CHEK GUIDE) w/Device KIT by Does not apply route.    [provider]  clonazePAM (KLONOPIN) 0.5 MG tablet TAKE 1 TO 2 TABLETS(0.5 TO 1 MG) BY MOUTH AT BEDTIME AS NEEDED FOR RESTLESS LEGS 06/08/20   Viviana Simpler I, MD  ferrous sulfate 325 (65 FE) MG tablet Take 1 tablet (325 mg total) by mouth 2 (two) times daily. 06/09/18   Gouru, Illene Silver, MD  glucose blood (ACCU-CHEK GUIDE) test strip 1 each by Other route as needed for other. Use as instructed    [provider]  lisinopril-hydrochlorothiazide (ZESTORETIC) 20-12.5 MG tablet Take 2 tablets by mouth daily. 11/04/19   Venia Carbon, MD  metFORMIN (GLUCOPHAGE-XR) 500 MG 24 hr tablet TAKE 1 TABLET(500 MG) BY MOUTH DAILY WITH BREAKFAST 02/05/20   Viviana Simpler I, MD  omeprazole (PRILOSEC) 40 MG capsule TAKE 1 CAPSULE(40 MG) BY MOUTH TWICE DAILY BEFORE A MEAL 08/21/17   Viviana Simpler I, MD  simvastatin (ZOCOR) 80 MG tablet TAKE 1 TABLET(80 MG) BY MOUTH DAILY 10/23/19   Venia Carbon, MD  vitamin B-12 (CYANOCOBALAMIN) 1000 MCG tablet Take 1,000 mcg by mouth daily.    [provider]  Allergies Nifedipine and Metformin  Family History  Problem Relation Age of Onset  . Stroke Father   . Cancer Sister        breast cancer  . Hypertension Mother   . Hypertension Other        sibling  . Diabetes Other        grandmother    Social History Social History   Tobacco Use  . Smoking status: Current Every Day Smoker    Packs/day: 1.00    Types: Cigarettes  . Smokeless tobacco: Never Used  Vaping Use  . Vaping Use: Never used  Substance Use Topics  . Alcohol use: No  . Drug use: No    Review of Systems Constitutional: No fever/chills. Positive for  generalized weakness. Eyes: No visual changes. ENT: No sore throat. Cardiovascular: Denies chest pain. Respiratory: Positive for shortness of breath.  Gastrointestinal: No abdominal pain.  No nausea, no vomiting.  No diarrhea.   Genitourinary: Negative for dysuria. Musculoskeletal: Negative for back pain. Skin: Negative for rash. Neurological: Negative for headaches, focal weakness or numbness.  ____________________________________________   PHYSICAL EXAM:  VITAL SIGNS: ED Triage Vitals  Enc Vitals Group     BP 06/24/20 1656 93/60     Pulse Rate 06/24/20 1656 (!) 119     Resp 06/24/20 1656 17     Temp 06/24/20 1700 97.6 F (36.4 C)     Temp Source 06/24/20 1700 Oral     SpO2 06/24/20 1655 98 %     Weight 06/24/20 1657 112 lb (50.8 kg)     Height 06/24/20 1657 _0  (1.626 m)     Head Circumference --      Peak Flow --      Pain Score 06/24/20 1656 0   Constitutional: Alert and oriented.  Eyes: Conjunctivae are normal.  ENT      Head: Normocephalic and atraumatic.      Nose: No congestion/rhinnorhea.      Mouth/Throat: Pale musoca.       Neck: No stridor. Hematological/Lymphatic/Immunilogical: No cervical lymphadenopathy. Cardiovascular: Normal rate, regular rhythm.  No murmurs, rubs, or gallops.  Respiratory: Normal respiratory effort without tachypnea nor retractions. Breath sounds are clear and equal bilaterally. No wheezes/rales/rhonchi. Gastrointestinal: Soft and non tender. No rebound. No guarding.  Rectal: Dark brown stool on glove. GUIAC positive.  Musculoskeletal: Normal range of motion in all extremities. No lower extremity edema. Neurologic:  Normal speech and language. No gross focal neurologic deficits are appreciated.  Skin:  Skin is warm, dry and intact. No rash noted. Psychiatric: Mood and affect are normal. Speech and behavior are normal. Patient exhibits appropriate insight and judgment.  ____________________________________________    LABS  (pertinent positives/negatives)  BMP na 141, k 4.6, glu 151, cr 1.25 CBC wbc 6.0, hgb 5.2, plt 386 ____________________________________________   EKG  I, Nance Pear, attending physician, personally viewed and interpreted this EKG  EKG Time: 1657 Rate: 119 Rhythm: sinus tachycardia Axis: normal Intervals: qtc 392 QRS: narrow ST changes: no st elevation Impression: abnormal ekg ____________________________________________    RADIOLOGY  None  ____________________________________________   PROCEDURES  Procedures  CRITICAL CARE Performed by: Nance Pear   Total critical care time: 30 minutes  Critical care time was exclusive of separately billable procedures and treating other patients.  Critical care was necessary to treat or prevent imminent or life-threatening deterioration.  Critical care was time spent personally by me on the following activities: development of treatment plan with patient and/or surrogate  as well as nursing, discussions with consultants, evaluation of patient's response to treatment, examination of patient, obtaining history from patient or surrogate, ordering and performing treatments and interventions, ordering and review of laboratory studies, ordering and review of radiographic studies, pulse oximetry and re-evaluation of patient's condition.  ____________________________________________   INITIAL IMPRESSION / ASSESSMENT AND PLAN / ED COURSE  Pertinent labs & imaging results that were available during my care of the patient were reviewed by me and considered in my medical decision making (see chart for details).   Patient presented to the emergency department today because of concerns for weakness.  She states the symptoms have been present for the past 3 weeks.  On exam patient was noted to have some hypotension as well as slight tachycardia.  Patient was guaiac positive.  She had pale mucosa and blood work is consistent with anemia.   I discussed this with the patient.  Discussed blood transfusions and consented patient.  Will plan on admission.  ____________________________________________   FINAL CLINICAL IMPRESSION(S) / ED DIAGNOSES  Final diagnoses:  Weakness  Gastrointestinal hemorrhage, unspecified gastrointestinal hemorrhage type  Anemia, unspecified type     Note: This dictation was prepared with Dragon dictation. Any transcriptional errors that result from this process are unintentional     Nance Pear, MD 06/24/20 [phone]

## 2020-06-24 NOTE — ED Triage Notes (Addendum)
Pt to ED via [facility] from home. Per EMS pt stating she hasn't felt well x2 days. Hypotensive in route, last BP reading 82/42 and ST on monitor. EMS unable to start IV and give fluids. CBG 190  Upon arrival pt in NAD. Pt denies CP but states she feels more SOB x3wks. Pt has rescue inhaler at home. Pt stating she feels dizzy upon standing but denies LOC or falling.

## 2020-06-25 ENCOUNTER — Encounter: Payer: Self-pay | Admitting: Family Medicine

## 2020-06-25 DIAGNOSIS — E785 Hyperlipidemia, unspecified: Secondary | ICD-10-CM

## 2020-06-25 DIAGNOSIS — E1169 Type 2 diabetes mellitus with other specified complication: Secondary | ICD-10-CM

## 2020-06-25 DIAGNOSIS — K921 Melena: Secondary | ICD-10-CM

## 2020-06-25 LAB — HEMOGLOBIN AND HEMATOCRIT, BLOOD
HCT: 24.3 % — ABNORMAL LOW (ref 36.0–46.0)
HCT: 21.7 % — ABNORMAL LOW (ref 36.0–46.0)
HCT: 22.2 % — ABNORMAL LOW (ref 36.0–46.0)
Hemoglobin: 7.7 g/dL — ABNORMAL LOW (ref 12.0–15.0)
Hemoglobin: 6.9 g/dL — ABNORMAL LOW (ref 12.0–15.0)
Hemoglobin: 7.2 g/dL — ABNORMAL LOW (ref 12.0–15.0)

## 2020-06-25 LAB — CBC WITH DIFFERENTIAL/PLATELET
Abs Immature Granulocytes: 0.05 10*3/uL (ref 0.00–0.07)
Basophils Absolute: 0 10*3/uL (ref 0.0–0.1)
Basophils Relative: 0 %
Eosinophils Absolute: 0.1 10*3/uL (ref 0.0–0.5)
Eosinophils Relative: 1 %
HCT: 30.1 % — ABNORMAL LOW (ref 36.0–46.0)
Hemoglobin: 9.9 g/dL — ABNORMAL LOW (ref 12.0–15.0)
Immature Granulocytes: 1 %
Lymphocytes Relative: 7 %
Lymphs Abs: 0.6 10*3/uL — ABNORMAL LOW (ref 0.7–4.0)
MCH: 27.4 pg (ref 26.0–34.0)
MCHC: 32.9 g/dL (ref 30.0–36.0)
MCV: 83.4 fL (ref 80.0–100.0)
Monocytes Absolute: 0.3 10*3/uL (ref 0.1–1.0)
Monocytes Relative: 3 %
Neutro Abs: 7.3 10*3/uL (ref 1.7–7.7)
Neutrophils Relative %: 88 %
Platelets: 251 10*3/uL (ref 150–400)
RBC: 3.61 MIL/uL — ABNORMAL LOW (ref 3.87–5.11)
RDW: 16.7 % — ABNORMAL HIGH (ref 11.5–15.5)
WBC: 8.3 10*3/uL (ref 4.0–10.5)
nRBC: 0 % (ref 0.0–0.2)

## 2020-06-25 LAB — URINALYSIS, COMPLETE (UACMP) WITH MICROSCOPIC
Bacteria, UA: NONE SEEN
Bilirubin Urine: NEGATIVE
Glucose, UA: NEGATIVE mg/dL
Hgb urine dipstick: NEGATIVE
Ketones, ur: NEGATIVE mg/dL
Leukocytes,Ua: NEGATIVE
Nitrite: NEGATIVE
Protein, ur: NEGATIVE mg/dL
Specific Gravity, Urine: 1.017 (ref 1.005–1.030)
pH: 5 (ref 5.0–8.0)

## 2020-06-25 LAB — BASIC METABOLIC PANEL
Anion gap: 9 (ref 5–15)
BUN: 40 mg/dL — ABNORMAL HIGH (ref 8–23)
CO2: 20 mmol/L — ABNORMAL LOW (ref 22–32)
Calcium: 9 mg/dL (ref 8.9–10.3)
Chloride: 111 mmol/L (ref 98–111)
Creatinine, Ser: 1.15 mg/dL — ABNORMAL HIGH (ref 0.44–1.00)
GFR calc Af Amer: 53 mL/min — ABNORMAL LOW (ref >60)
GFR calc non Af Amer: 46 mL/min — ABNORMAL LOW (ref >60)
Glucose, Bld: 91 mg/dL (ref 70–99)
Potassium: 4.1 mmol/L (ref 3.5–5.1)
Sodium: 140 mmol/L (ref 135–145)

## 2020-06-25 LAB — CBC
HCT: 22.2 % — ABNORMAL LOW (ref 36.0–46.0)
Hemoglobin: 7 g/dL — ABNORMAL LOW (ref 12.0–15.0)
MCH: 25.3 pg — ABNORMAL LOW (ref 26.0–34.0)
MCHC: 31.5 g/dL (ref 30.0–36.0)
MCV: 80.1 fL (ref 80.0–100.0)
Platelets: 264 10*3/uL (ref 150–400)
RBC: 2.77 MIL/uL — ABNORMAL LOW (ref 3.87–5.11)
RDW: 16.2 % — ABNORMAL HIGH (ref 11.5–15.5)
WBC: 6.7 10*3/uL (ref 4.0–10.5)
nRBC: 0 % (ref 0.0–0.2)

## 2020-06-25 LAB — PREPARE RBC (CROSSMATCH)

## 2020-06-25 MED ORDER — HYDROCHLOROTHIAZIDE 25 MG PO TABS
25.0000 mg | ORAL_TABLET | Freq: Every day | ORAL | Status: DC
  Administered 2020-06-25: 25 mg via ORAL
  Filled 2020-06-25: qty 1

## 2020-06-25 MED ORDER — LISINOPRIL 20 MG PO TABS
40.0000 mg | ORAL_TABLET | Freq: Every day | ORAL | Status: DC
  Administered 2020-06-25: 40 mg via ORAL
  Filled 2020-06-25: qty 4

## 2020-06-25 MED ORDER — SODIUM CHLORIDE 0.9% IV SOLUTION
Freq: Once | INTRAVENOUS | Status: AC
  Filled 2020-06-25: qty 250

## 2020-06-25 NOTE — ED Notes (Signed)
Pt up and walking to bathroom. Pt stable on feet.

## 2020-06-25 NOTE — ED Notes (Addendum)
Attempted to call report to the floor. Per rn, unaware pt was coming to the floor. Will call back this rn.

## 2020-06-25 NOTE — ED Notes (Signed)
Pt given denture cup

## 2020-06-25 NOTE — ED Notes (Signed)
Provider notified of bp of 115/45

## 2020-06-25 NOTE — Progress Notes (Signed)
1        [facility] [place] NAME: Kathryn Ware    MR#:  [id number]  DATE OF BIRTH:  31-Jul-1941  SUBJECTIVE:  CHIEF COMPLAINT:   Chief Complaint  Patient presents with  . Dizziness  Week, dizzy, anemic  REVIEW OF SYSTEMS:  Review of Systems  Constitutional: Positive for malaise/fatigue. Negative for diaphoresis, fever and weight loss.  HENT: Negative for ear discharge, ear pain, hearing loss, nosebleeds, sore throat and tinnitus.   Eyes: Negative for blurred vision and pain.  Respiratory: Negative for cough, hemoptysis, shortness of breath and wheezing.   Cardiovascular: Negative for chest pain, palpitations, orthopnea and leg swelling.  Gastrointestinal: Negative for abdominal pain, blood in stool, constipation, diarrhea, heartburn, nausea and vomiting.  Genitourinary: Negative for dysuria, frequency and urgency.  Musculoskeletal: Negative for back pain and myalgias.  Skin: Negative for itching and rash.  Neurological: Positive for dizziness. Negative for tingling, tremors, focal weakness, seizures, weakness and headaches.  Psychiatric/Behavioral: Negative for depression. The patient is not nervous/anxious.    DRUG ALLERGIES:   Allergies  Allergen Reactions  . Nifedipine     REACTION: cough  . Metformin Other (See Comments)    REACTION: diarrhea when dosage is increased, ok when taking one tablet daily, patient aware that she is on metformin when it is listed that she is allergic   VITALS:  Blood pressure 121/75, pulse 81, temperature 97.7 F (36.5 C), temperature source Oral, resp. rate 17, height 5\' 4"  (1.626 m), weight 45.5 kg, SpO2 100 %. PHYSICAL EXAMINATION:  Physical Exam Vitals and nursing note reviewed.  Constitutional:      Appearance: She is underweight.  HENT:     Head: Normocephalic and atraumatic.  Eyes:     Conjunctiva/sclera: Conjunctivae normal.     Pupils: Pupils are equal, round, and reactive to light.  Neck:     Thyroid:  No thyromegaly.     Trachea: No tracheal deviation.  Cardiovascular:     Rate and Rhythm: Normal rate and regular rhythm.     Heart sounds: Normal heart sounds.  Pulmonary:     Effort: Pulmonary effort is normal. No respiratory distress.     Breath sounds: Normal breath sounds. No wheezing.  Chest:     Chest wall: No tenderness.  Abdominal:     General: Bowel sounds are normal. There is no distension.     Palpations: Abdomen is soft.     Tenderness: There is no abdominal tenderness.  Musculoskeletal:        General: Normal range of motion.     Cervical back: Normal range of motion and neck supple.  Skin:    General: Skin is warm and dry.     Findings: No rash.  Neurological:     Mental Status: She is alert and oriented to person, place, and time.     Cranial Nerves: No cranial nerve deficit.    LABORATORY PANEL:  Female CBC Recent Labs  Lab 06/25/20 0500 06/25/20 0745 06/25/20 1353  WBC 6.7  --   --   HGB 7.0*   < > 7.2*  HCT 22.2*   < > 22.2*  PLT 264  --   --    < > = values in this interval not displayed.   ------------------------------------------------------------------------------------------------------------------ Chemistries  Recent Labs  Lab 06/25/20 0500  NA 140  K 4.1  CL 111  CO2 20*  GLUCOSE 91  BUN 40*  CREATININE 1.15*  CALCIUM  9.0   RADIOLOGY:  No results found. ASSESSMENT AND PLAN:  79 year old female known history of diabetes, hypertension, hyperlipidemia is admitted for acute blood loss anemia  Acute blood loss anemia likely secondary to GI bleed Given 2 packed red blood cell on admission with hemoglobin still of 6.9 this morning Patient symptomatic with generalized weakness and hypotension GI planning endoscopy hopefully tomorrow if anesthesia on board I have ordered 1 more unit of packed red blood cell this morning Monitor H&H  Essential hypertension Monitor blood pressure.  She was hypotensive in the ED so we will hold off any  blood pressure medication at this time  Diabetes Hold Metformin, continue sliding scale insulin   Body mass index is 17.22 kg/m.      Status is: Inpatient  Remains inpatient appropriate because:Hemodynamically unstable   Dispo: The patient is from: Home              Anticipated d/c is to: Home              Anticipated d/c date is: 2 days              Patient currently is not medically stable to d/c.  Awaiting GI evaluation including possible endoscopy tomorrow, needs to have stability of her H&H.    DVT prophylaxis:            SCDs Start: 06/24/20 1935     Family Communication: Discussed with patient   All the records are reviewed and case discussed with Care Management/Social Worker. Management plans discussed with the patient, nursing and they are in agreement.  CODE STATUS: Full Code  TOTAL TIME TAKING CARE OF THIS PATIENT: 35 minutes.   More than 50% of the time was spent in counseling/coordination of care: YES  POSSIBLE D/C IN 1-2 DAYS, DEPENDING ON CLINICAL CONDITION.   Max Sane M.D on 06/25/2020 at 9:23 PM  [facility]   CC: Primary care physician; Venia Carbon, MD  Note: This dictation was prepared with Dragon dictation along with smaller phrase technology. Any transcriptional errors that result from this process are unintentional.

## 2020-06-25 NOTE — ED Notes (Signed)
Message sent to pharmacy for ferrous sulfate and vit b-12

## 2020-06-25 NOTE — Consult Note (Signed)
Lucilla Lame, MD [facility]  [address] [place], [place] [address] Phone: [phone] Fax : [phone]  Consultation  Referring Provider:     Dr. Posey Pronto Primary Care Physician:  Venia Carbon, MD Primary Gastroenterologist:  Dr. Marius Ditch         Reason for Consultation:     Blood loss anemia  Date of Admission:  06/24/2020 Date of Consultation:  06/25/2020         HPI:   Kathryn Ware is a 79 y.o. female who has had a history of anemia with multiple AVMs found.  The patient has had multiple upper endoscopies and small bowel enteroscopy with Dr. Marius Ditch.  She has multiple AVMs in the past.  The patient also had a colonoscopy by Dr. Vira Agar in 2018. The patient's last small bowel enteroscopy was in March 2021 and there were a few AVMs seen in duodenum and the jejunum that were treated with cautery. The patient had a hemoglobin of 11 4 months ago and was admitted with a hemoglobin of 5.2.  The patient was transfused and had a hemoglobin of 7.7 with a repeat this morning at 7.0. She reports that her stools are dark all the time and she takes her iron intermittently.  She reports that the iron makes her constipated that is why she does not take it all the time.  She does have black stools that she attributes to the iron and denies having any loose bowel movements that are black. The patient had come to the emergency room because she had been having dizziness.  She also reported generalized weakness on exertion with shortness of breath.  On admission she had tachycardia with a heart rate of 115 and respirations between 20 and 25.  The patient's BUN was also elevated at 41 with a creatinine of 1.2.  The patient was typed and crossmatched for 2 units of blood and was given 500 cc of normal saline bolus and started on a Protonix drip.  Past Medical History:  Diagnosis Date  . Anemia   . ARF (acute renal failure) ([place])   . Blood transfusion without reported diagnosis   . Diabetes mellitus  type II   . GI (gastrointestinal bleed)   . GI AVM (gastrointestinal arteriovenous vascular malformation)    stomach  . History of echocardiogram 12/2013   a. 12/2013: EF 60-65%, DD, mild LVH, nl RV size & systolic function, mild MR/TR, mild AS, nl RVSP; b. TTE 04/2017: EF 60-65%, normal wall motion, GR1DD, mild MR, left atrium normal in size, normal RV systolic function, PASP normal   . History of nuclear stress test    a. 12/2013: low risk, no sig WMA, nondiag EKG 2/2 baseline LVH w/ repol abnl, no sig ischemia, EF 70% no artifact  . HLD (hyperlipidemia)   . HTN (hypertension)   . Hypercholesterolemia   . IDA (iron deficiency anemia)   . Polyp of colon, adenomatous   . RLS (restless legs syndrome)     Past Surgical History:  Procedure Laterality Date  . ABDOMINAL ADHESION SURGERY  11/2001   Dr. Tamala Julian  . ABDOMINAL HYSTERECTOMY  1976   RSO (fibroid)  . COLONOSCOPY WITH PROPOFOL N/A 07/07/2016   Procedure: COLONOSCOPY WITH PROPOFOL;  Surgeon: Manya Silvas, MD;  Location: [facility] ENDOSCOPY;  Service: Endoscopy;  Laterality: N/A;  . COLONOSCOPY WITH PROPOFOL N/A 12/14/2016   Procedure: COLONOSCOPY WITH PROPOFOL;  Surgeon: Manya Silvas, MD;  Location: [facility] ENDOSCOPY;  Service: Endoscopy;  Laterality: N/A;  . DOBUTAMINE STRESS ECHO  11/10   normal  . DOPPLER ECHOCARDIOGRAPHY     EF 55%, mild MR, TR  . ENTEROSCOPY N/A 06/08/2018   Procedure: ENTEROSCOPY;  Surgeon: Lin Landsman, MD;  Location: [facility] ENDOSCOPY;  Service: Gastroenterology;  Laterality: N/A;  . ENTEROSCOPY N/A 01/06/2020   Procedure: ENTEROSCOPY;  Surgeon: Lin Landsman, MD;  Location: [facility] ENDOSCOPY;  Service: Gastroenterology;  Laterality: N/A;  . ESOPHAGOGASTRODUODENOSCOPY  07/2006   multiple angioectasias  . ESOPHAGOGASTRODUODENOSCOPY Left 03/12/2015   Procedure: place tube in throat and evaluate stomach and duodenum for source of bleeding. Cauterize if concern for rebleeding.;  Surgeon: Hulen Luster, MD;   Location: [facility] ENDOSCOPY;  Service: Endoscopy;  Laterality: Left;  . ESOPHAGOGASTRODUODENOSCOPY N/A 12/14/2016   Procedure: ESOPHAGOGASTRODUODENOSCOPY (EGD);  Surgeon: Manya Silvas, MD;  Location: [facility] ENDOSCOPY;  Service: Endoscopy;  Laterality: N/A;  . ESOPHAGOGASTRODUODENOSCOPY N/A 07/19/2017   Procedure: ESOPHAGOGASTRODUODENOSCOPY (EGD);  Surgeon: Lin Landsman, MD;  Location: [facility] ENDOSCOPY;  Service: Gastroenterology;  Laterality: N/A;  . ESOPHAGOGASTRODUODENOSCOPY (EGD) WITH PROPOFOL N/A 11/23/2015   Procedure: ESOPHAGOGASTRODUODENOSCOPY (EGD) WITH PROPOFOL;  Surgeon: Hulen Luster, MD;  Location: [facility] ENDOSCOPY;  Service: Gastroenterology;  Laterality: N/A;  . ESOPHAGOGASTRODUODENOSCOPY (EGD) WITH PROPOFOL N/A 07/07/2016   Procedure: ESOPHAGOGASTRODUODENOSCOPY (EGD) WITH PROPOFOL;  Surgeon: Manya Silvas, MD;  Location: [facility] ENDOSCOPY;  Service: Endoscopy;  Laterality: N/A;  . ESOPHAGOGASTRODUODENOSCOPY (EGD) WITH PROPOFOL N/A 05/19/2017   Procedure: ESOPHAGOGASTRODUODENOSCOPY (EGD) WITH PROPOFOL;  Surgeon: Manya Silvas, MD;  Location: [facility] ENDOSCOPY;  Service: Endoscopy;  Laterality: N/A;  . laryngeal polyp  10/2008   Dr. Tami Ribas  . TOP      Prior to Admission medications   Medication Sig Start Date End Date Taking? Authorizing Provider  clonazePAM (KLONOPIN) 0.5 MG tablet TAKE 1 TO 2 TABLETS(0.5 TO 1 MG) BY MOUTH AT BEDTIME AS NEEDED FOR RESTLESS LEGS Patient taking differently: Take 0.5-1 mg by mouth at bedtime as needed (Restless Legs). TAKE 1 TO 2 TABLETS(0.5 TO 1 MG) BY MOUTH AT BEDTIME AS NEEDED FOR RESTLESS LEGS 06/08/20  Yes Venia Carbon, MD  ferrous sulfate 325 (65 FE) MG tablet Take 1 tablet (325 mg total) by mouth 2 (two) times daily. 06/09/18  Yes Gouru, Illene Silver, MD  lisinopril-hydrochlorothiazide (ZESTORETIC) 20-12.5 MG tablet Take 2 tablets by mouth daily. 11/04/19  Yes Venia Carbon, MD  metFORMIN (GLUCOPHAGE-XR) 500 MG 24 hr tablet TAKE 1 TABLET(500 MG) BY  MOUTH DAILY WITH BREAKFAST Patient taking differently: Take 500 mg by mouth daily with breakfast.  02/05/20  Yes Venia Carbon, MD  omeprazole (PRILOSEC) 40 MG capsule TAKE 1 CAPSULE(40 MG) BY MOUTH TWICE DAILY BEFORE A MEAL 08/21/17  Yes Viviana Simpler I, MD  simvastatin (ZOCOR) 80 MG tablet TAKE 1 TABLET(80 MG) BY MOUTH DAILY 10/23/19  Yes Venia Carbon, MD  vitamin B-12 (CYANOCOBALAMIN) 1000 MCG tablet Take 1,000 mcg by mouth daily.   Yes [provider]  Accu-Chek Softclix Lancets lancets  02/01/20   [provider]  acetaminophen (TYLENOL) 325 MG tablet Take 2 tablets (650 mg total) by mouth every 6 (six) hours as needed for mild pain (or Fever >/= 101). 06/09/18   Gouru, Illene Silver, MD  albuterol (VENTOLIN HFA) 108 (90 Base) MCG/ACT inhaler INHALE 2 PUFFS INTO THE LUNGS EVERY 6 HOURS AS NEEDED FOR WHEEZING OR SHORTNESS OF BREATH Patient taking differently: Inhale 2 puffs into the lungs every 6 (six) hours as needed. INHALE 2 PUFFS  INTO THE LUNGS EVERY 6 HOURS AS NEEDED FOR WHEEZING OR SHORTNESS OF BREATH 06/09/20   Viviana Simpler I, MD  Blood Glucose Monitoring Suppl (ACCU-CHEK GUIDE) w/Device KIT by Does not apply route.    [provider]  glucose blood (ACCU-CHEK GUIDE) test strip 1 each by Other route as needed for other. Use as instructed    [provider]    Family History  Problem Relation Age of Onset  . Stroke Father   . Cancer Sister        breast cancer  . Hypertension Mother   . Hypertension Other        sibling  . Diabetes Other        grandmother     Social History   Tobacco Use  . Smoking status: Current Every Day Smoker    Packs/day: 1.00    Types: Cigarettes  . Smokeless tobacco: Never Used  Vaping Use  . Vaping Use: Never used  Substance Use Topics  . Alcohol use: No  . Drug use: No    Allergies as of 06/24/2020 - Review Complete 06/24/2020  Allergen Reaction Noted  . Nifedipine  09/05/2006  . Metformin Other (See  Comments) 09/05/2006    Review of Systems:    All systems reviewed and negative except where noted in HPI.   Physical Exam:  Vital signs in last 24 hours: Temp:  [97.6 F (36.4 C)-98.2 F (36.8 C)] 98 F (36.7 C) (09/01 2355) Pulse Rate:  [70-119] 94 (09/02 1030) Resp:  [16-25] 20 (09/02 1030) BP: (88-160)/(40-69) 137/57 (09/02 1030) SpO2:  [96 %-100 %] 100 % (09/02 1030) Weight:  [50.8 kg] 50.8 kg (09/01 1657)   General:   Pleasant, cooperative in NAD Head:  Normocephalic and atraumatic. Eyes:   No icterus.   Conjunctiva pink. PERRLA. Ears:  Normal auditory acuity. Neck:  Supple; no masses or thyroidomegaly Lungs: Respirations even and unlabored. Lungs clear to auscultation bilaterally.   No wheezes, crackles, or rhonchi.  Heart:  Regular rate and rhythm;  Without murmur, clicks, rubs or gallops Abdomen:  Soft, nondistended, nontender. Normal bowel sounds. No appreciable masses or hepatomegaly.  No rebound or guarding.  Rectal:  Not performed. Msk:  Symmetrical without gross deformities.    Extremities:  Without edema, cyanosis or clubbing. Neurologic:  Alert and oriented x3;  grossly normal neurologically. Skin:  Intact without significant lesions or rashes. Cervical Nodes:  No significant cervical adenopathy. Psych:  Alert and cooperative. Normal affect.  LAB RESULTS: Recent Labs    06/24/20 1702 06/24/20 1702 06/25/20 0108 06/25/20 0500 06/25/20 0745  WBC 6.0  --   --  6.7  --   HGB 5.2*   < > 7.7* 7.0* 6.9*  HCT 18.0*   < > 24.3* 22.2* 21.7*  PLT 386  --   --  264  --    < > = values in this interval not displayed.   BMET Recent Labs    06/24/20 1702 06/25/20 0500  NA 141 140  K 4.6 4.1  CL 109 111  CO2 19* 20*  GLUCOSE 151* 91  BUN 41* 40*  CREATININE 1.25* 1.15*  CALCIUM 10.2 9.0   LFT No results for input(s): PROT, ALBUMIN, AST, ALT, ALKPHOS, BILITOT, BILIDIR, IBILI in the last 72 hours. PT/INR No results for input(s): LABPROT, INR in the last  72 hours.  STUDIES: No results found.    Impression / Plan:   Assessment: Active Problems:   GI bleeding  Kathryn Ware is a 79 y.o. y/o female with a long history of anemia with a extensive work-up and multiple small bowel enteroscopy's.  The patient has had treatment of multiple AVMs in the duodenum and jejunum by Dr. Marius Ditch.  The patient has been taking her iron intermittently and now comes in with significant anemia.  She denies any abdominal pain nausea or vomiting at the present time.  Plan:  The patient was set up to have an upper endoscopy today to look for any source of acute GI bleeding but after review of her chart by anesthesia they would like her to receive further blood transfusions before sedating her.  I have notified the hospitalist who has ordered further transfusions and the patient will likely be put on for tomorrow.  Thank you for involving me in the care of this patient.      LOS: 1 day   Lucilla Lame, MD, [facility] 06/25/2020, 12:33 PM,  Pager [phone] 7am-5pm  Check AMION for 5pm -7am coverage and on weekends   Note: This dictation was prepared with Dragon dictation along with smaller phrase technology. Any transcriptional errors that result from this process are unintentional.

## 2020-06-25 NOTE — ED Notes (Signed)
Order placed for CBC post transfusion. Report given to [facility].

## 2020-06-25 NOTE — ED Notes (Signed)
Pt ambulatory to bathroom with one person standby assist.

## 2020-06-26 ENCOUNTER — Encounter: Payer: Self-pay | Admitting: Family Medicine

## 2020-06-26 ENCOUNTER — Encounter
Admission: EM | Disposition: A | Discharge status: Home / Self Care | Payer: Self-pay | Source: Home / Self Care | Attending: Internal Medicine

## 2020-06-26 ENCOUNTER — Inpatient Hospital Stay: Payer: Medicare HMO | Admitting: Anesthesiology

## 2020-06-26 DIAGNOSIS — D649 Anemia, unspecified: Secondary | ICD-10-CM

## 2020-06-26 DIAGNOSIS — K2971 Gastritis, unspecified, with bleeding: Secondary | ICD-10-CM

## 2020-06-26 DIAGNOSIS — R531 Weakness: Secondary | ICD-10-CM

## 2020-06-26 DIAGNOSIS — K31811 Angiodysplasia of stomach and duodenum with bleeding: Secondary | ICD-10-CM

## 2020-06-26 HISTORY — PX: ESOPHAGOGASTRODUODENOSCOPY (EGD) WITH PROPOFOL: SHX5813

## 2020-06-26 LAB — TYPE AND SCREEN
ABO/RH(D): B POS
Antibody Screen: NEGATIVE
Unit division: 0
Unit division: 0
Unit division: 0

## 2020-06-26 LAB — BASIC METABOLIC PANEL
Anion gap: 8 (ref 5–15)
BUN: 24 mg/dL — ABNORMAL HIGH (ref 8–23)
CO2: 19 mmol/L — ABNORMAL LOW (ref 22–32)
Calcium: 8.5 mg/dL — ABNORMAL LOW (ref 8.9–10.3)
Chloride: 113 mmol/L — ABNORMAL HIGH (ref 98–111)
Creatinine, Ser: 0.89 mg/dL (ref 0.44–1.00)
GFR calc Af Amer: >60 mL/min (ref >60)
GFR calc non Af Amer: >60 mL/min (ref >60)
Glucose, Bld: 74 mg/dL (ref 70–99)
Potassium: 3.6 mmol/L (ref 3.5–5.1)
Sodium: 140 mmol/L (ref 135–145)

## 2020-06-26 LAB — CBC
HCT: 25.9 % — ABNORMAL LOW (ref 36.0–46.0)
Hemoglobin: 8.4 g/dL — ABNORMAL LOW (ref 12.0–15.0)
MCH: 27.1 pg (ref 26.0–34.0)
MCHC: 32.4 g/dL (ref 30.0–36.0)
MCV: 83.5 fL (ref 80.0–100.0)
Platelets: 247 10*3/uL (ref 150–400)
RBC: 3.1 MIL/uL — ABNORMAL LOW (ref 3.87–5.11)
RDW: 16.9 % — ABNORMAL HIGH (ref 11.5–15.5)
WBC: 6.8 10*3/uL (ref 4.0–10.5)
nRBC: 0.3 % — ABNORMAL HIGH (ref 0.0–0.2)

## 2020-06-26 LAB — BPAM RBC
Blood Product Expiration Date: 202109232359
Blood Product Expiration Date: 202109262359
Blood Product Expiration Date: 202109262359
ISSUE DATE / TIME: 202109011831
ISSUE DATE / TIME: 202109012140
ISSUE DATE / TIME: 202109021700
Unit Type and Rh: 7300
Unit Type and Rh: 7300
Unit Type and Rh: 7300

## 2020-06-26 LAB — GLUCOSE, CAPILLARY: Glucose-Capillary: 61 mg/dL — ABNORMAL LOW (ref 70–99)

## 2020-06-26 SURGERY — ESOPHAGOGASTRODUODENOSCOPY (EGD) WITH PROPOFOL
Anesthesia: General

## 2020-06-26 MED ORDER — LIDOCAINE HCL (CARDIAC) PF 100 MG/5ML IV SOSY
PREFILLED_SYRINGE | INTRAVENOUS | Status: DC | PRN
  Administered 2020-06-26: 15 mg via INTRAVENOUS

## 2020-06-26 MED ORDER — OMEPRAZOLE 40 MG PO CPDR
40.0000 mg | DELAYED_RELEASE_CAPSULE | Freq: Two times a day (BID) | ORAL | 3 refills | Status: DC
Start: 2020-06-26 — End: 2021-06-09

## 2020-06-26 MED ORDER — PROPOFOL 500 MG/50ML IV EMUL
INTRAVENOUS | Status: AC
  Filled 2020-06-26: qty 50

## 2020-06-26 MED ORDER — PROPOFOL 10 MG/ML IV BOLUS
INTRAVENOUS | Status: DC | PRN
  Administered 2020-06-26: 20 mg via INTRAVENOUS
  Administered 2020-06-26: 50 mg via INTRAVENOUS

## 2020-06-26 MED ORDER — LIDOCAINE HCL (PF) 2 % IJ SOLN
INTRAMUSCULAR | Status: AC
  Filled 2020-06-26: qty 5

## 2020-06-26 MED ORDER — PANTOPRAZOLE SODIUM 40 MG IV SOLR: 40.0000 mg | Freq: Two times a day (BID) | INTRAVENOUS | Status: DC

## 2020-06-26 MED ORDER — PROPOFOL 500 MG/50ML IV EMUL
INTRAVENOUS | Status: DC | PRN
  Administered 2020-06-26: 110 ug/kg/min via INTRAVENOUS

## 2020-06-26 NOTE — Transfer of Care (Signed)
Immediate Anesthesia Transfer of Care Note  Patient: Kathryn Ware  Procedure(s) Performed: ESOPHAGOGASTRODUODENOSCOPY (EGD) WITH PROPOFOL (N/A )  Patient Location: PACU and Endoscopy Unit  Anesthesia Type:General  Level of Consciousness: awake  Airway & Oxygen Therapy: Patient Spontanous Breathing  Post-op Assessment: Report given to RN  Post vital signs: stable  Last Vitals:  Vitals Value Taken Time  BP    Temp    Pulse    Resp    SpO2      Last Pain:  Vitals:   06/26/20 0915  TempSrc:   PainSc: 0-No pain         Complications: No complications documented.

## 2020-06-26 NOTE — Anesthesia Postprocedure Evaluation (Signed)
Anesthesia Post Note  Patient: Kathryn Ware  Procedure(s) Performed: ESOPHAGOGASTRODUODENOSCOPY (EGD) WITH PROPOFOL (N/A )  Patient location during evaluation: Endoscopy Anesthesia Type: General Level of consciousness: awake and alert Pain management: pain level controlled Vital Signs Assessment: post-procedure vital signs reviewed and stable Respiratory status: spontaneous breathing and respiratory function stable Cardiovascular status: stable Anesthetic complications: no   No complications documented.   Last Vitals:  Vitals:   06/26/20 1128 06/26/20 1138  BP: 129/88 (!) 142/58  Pulse: 77 66  Resp: (!) 26 (!) 29  Temp: (!) 36.3 C   SpO2: 100% 100%    Last Pain:  Vitals:   06/26/20 1138  TempSrc:   PainSc: 0-No pain                 Gretna Bergin K

## 2020-06-26 NOTE — Discharge Instructions (Signed)
Gastrointestinal Bleeding Gastrointestinal (GI) bleeding is bleeding somewhere along the path that food travels through the body (digestive tract). This path is anywhere between the mouth and the opening of the butt (anus). You may have blood in your poop (stool) or have black poop. If you throw up (vomit), there may be blood in it. This condition can be mild, serious, or even life-threatening. If you have a lot of bleeding, you may need to stay in the hospital. What are the causes? This condition may be caused by:  Irritation and swelling of the esophagus (esophagitis). The esophagus is part of the body that moves food from your mouth to your stomach.  Swollen veins in the butt (hemorrhoids).  Areas of painful tearing in the opening of the butt (anal fissures). These are often caused by passing hard poop.  Pouches that form on the colon over time (diverticulosis).  Irritation and swelling (diverticulitis) in areas where pouches have formed on the colon.  Growths (polyps) or cancer. Colon cancer often starts out as growths that are not cancer.  Irritation of the stomach lining (gastritis).  Sores (ulcers) in the stomach. What increases the risk? You are more likely to develop this condition if you:  Have a certain type of infection in your stomach (Helicobacter pylori infection).  Take certain medicines.  Smoke.  Drink alcohol. What are the signs or symptoms? Common symptoms of this condition include:  Throwing up (vomiting) material that has bright red blood in it. It may look like coffee grounds.  Changes in your poop. The poop may: ? Have red blood in it. ? Be black, look like tar, and smell stronger than normal. ? Be red.  Pain or cramping in the belly (abdomen). How is this treated? Treatment for this condition depends on the cause of the bleeding. For example:  Sometimes, the bleeding can be stopped during a procedure that is done to find the problem (endoscopy or  colonoscopy).  Medicines can be used to: ? Help control irritation, swelling, or infection. ? Reduce acid in your stomach.  Certain problems can be treated with: ? Creams. ? Medicines that are put in the butt (suppositories). ? Warm baths.  Surgery is sometimes needed.  If you lose a lot of blood, you may need a blood transfusion. If bleeding is mild, you may be allowed to go home. If there is a lot of bleeding, you will need to stay in the hospital. Follow these instructions at home:   Take over-the-counter and prescription medicines only as told by your doctor.  Eat foods that have a lot of fiber in them. These foods include beans, whole grains, and fresh fruits and vegetables. You can also try eating 1-3 prunes each day.  Drink enough fluid to keep your pee (urine) pale yellow.  Keep all follow-up visits as told by your doctor. This is important. Contact a doctor if:  Your symptoms do not get better. Get help right away if:  Your bleeding does not stop.  You feel dizzy or you pass out (faint).  You feel weak.  You have very bad cramps in your back or belly.  You pass large clumps of blood (clots) in your poop.  Your symptoms are getting worse.  You have chest pain or fast heartbeats. Summary  GI bleeding is bleeding somewhere along the path that food travels through the body (digestive tract).  This bleeding can be caused by many things. Treatment depends on the cause of the bleeding.  Take medicines only as told by your doctor.  Keep all follow-up visits as told by your doctor. This is important. This information is not intended to replace advice given to you by your health care provider. Make sure you discuss any questions you have with your health care provider. Document Revised: 05/23/2018 Document Reviewed: 05/23/2018 Elsevier Patient Education  [place].   Anemia  Anemia is a condition in which you do not have enough red blood cells or  hemoglobin. Hemoglobin is a substance in red blood cells that carries oxygen. When you do not have enough red blood cells or hemoglobin (are anemic), your body cannot get enough oxygen and your organs may not work properly. As a result, you may feel very tired or have other problems. What are the causes? Common causes of anemia include:  Excessive bleeding. Anemia can be caused by excessive bleeding inside or outside the body, including bleeding from the intestine or from periods in women.  Poor nutrition.  Long-lasting (chronic) kidney, thyroid, and liver disease.  Bone marrow disorders.  Cancer and treatments for cancer.  HIV (human immunodeficiency virus) and AIDS (acquired immunodeficiency syndrome).  Treatments for HIV and AIDS.  Spleen problems.  Blood disorders.  Infections, medicines, and autoimmune disorders that destroy red blood cells. What are the signs or symptoms? Symptoms of this condition include:  Minor weakness.  Dizziness.  Headache.  Feeling heartbeats that are irregular or faster than normal (palpitations).  Shortness of breath, especially with exercise.  Paleness.  Cold sensitivity.  Indigestion.  Nausea.  Difficulty sleeping.  Difficulty concentrating. Symptoms may occur suddenly or develop slowly. If your anemia is mild, you may not have symptoms. How is this diagnosed? This condition is diagnosed based on:  Blood tests.  Your medical history.  A physical exam.  Bone marrow biopsy. Your health care provider may also check your stool (feces) for blood and may do additional testing to look for the cause of your bleeding. You may also have other tests, including:  Imaging tests, such as a CT scan or MRI.  Endoscopy.  Colonoscopy. How is this treated? Treatment for this condition depends on the cause. If you continue to lose a lot of blood, you may need to be treated at a hospital. Treatment may include:  Taking supplements of  iron, vitamin U38, or folic acid.  Taking a hormone medicine (erythropoietin) that can help to stimulate red blood cell growth.  Having a blood transfusion. This may be needed if you lose a lot of blood.  Making changes to your diet.  Having surgery to remove your spleen. Follow these instructions at home:  Take over-the-counter and prescription medicines only as told by your health care provider.  Take supplements only as told by your health care provider.  Follow any diet instructions that you were given.  Keep all follow-up visits as told by your health care provider. This is important. Contact a health care provider if:  You develop new bleeding anywhere in the body. Get help right away if:  You are very weak.  You are short of breath.  You have pain in your abdomen or chest.  You are dizzy or feel faint.  You have trouble concentrating.  You have bloody or black, tarry stools.  You vomit repeatedly or you vomit up blood. Summary  Anemia is a condition in which you do not have enough red blood cells or enough of a substance in your red blood cells that carries oxygen (  hemoglobin).  Symptoms may occur suddenly or develop slowly.  If your anemia is mild, you may not have symptoms.  This condition is diagnosed with blood tests as well as a medical history and physical exam. Other tests may be needed.  Treatment for this condition depends on the cause of the anemia. This information is not intended to replace advice given to you by your health care provider. Make sure you discuss any questions you have with your health care provider. Document Revised: 09/22/2017 Document Reviewed: 11/11/2016 Elsevier Patient Education  2020 Else[employer] Inc.  

## 2020-06-26 NOTE — Care Management Important Message (Signed)
Important Message  Patient Details  Name: Kathryn Ware MRN: [id number] Date of Birth: 1941/07/25   Medicare Important Message Given:  Yes  Initial Medicare IM given by Patient Access Associate on 06/25/2020 at 11:12am.    Dannette Barbara 06/26/2020, 9:32 AM

## 2020-06-26 NOTE — Op Note (Signed)
[facility] Patient Name: Kathryn Ware Procedure Date: 06/26/2020 10:57 AM MRN: [id number] Account #: [id number] Date of Birth: [birth year] Admit Type: Inpatient Age: 79 Room: [facility] ENDO ROOM 2 Gender: Female Note Status: Finalized Procedure:             Upper GI endoscopy Indications:           Iron deficiency anemia secondary to chronic blood loss Providers:             Lucilla Lame MD, MD Referring MD:          Venia Carbon (Referring MD) Medicines:             Propofol per Anesthesia Complications:         No immediate complications. Procedure:             Pre-Anesthesia Assessment:                        - Prior to the procedure, a History and Physical was                         performed, and patient medications and allergies were                         reviewed. The patient's tolerance of previous                         anesthesia was also reviewed. The risks and benefits                         of the procedure and the sedation options and risks                         were discussed with the patient. All questions were                         answered, and informed consent was obtained. Prior                         Anticoagulants: The patient has taken no previous                         anticoagulant or antiplatelet agents. ASA Grade                         Assessment: II - A patient with mild systemic disease.                         After reviewing the risks and benefits, the patient                         was deemed in satisfactory condition to undergo the                         procedure.                        After obtaining informed consent, the endoscope was  passed under direct vision. Throughout the procedure,                         the patient's blood pressure, pulse, and oxygen                         saturations were monitored continuously. The Endoscope                         was introduced  through the mouth, and advanced to the                         second part of duodenum. The upper GI endoscopy was                         accomplished without difficulty. The patient tolerated                         the procedure well. Findings:      The examined esophagus was normal.      Localized moderate inflammation with hemorrhage was found in the gastric       antrum. Coagulation for hemostasis using argon plasma at 2 liters/minute       and 30 watts was successful.      Multiple angiodysplastic lesions with stigmata of recent bleeding were       found in the duodenal bulb, in the first portion of the duodenum and in       the second portion of the duodenum. Coagulation for hemostasis using       argon plasma at 2 liters/minute and 30 watts was successful.      Localized mucosal variance characterized by an opening that may ectopic       pancrease or a GIST was found in the duodenal bulb. Impression:            - Normal esophagus.                        - Gastritis with hemorrhage. Treated with argon plasma                         coagulation (APC).                        - Multiple recently bleeding angiodysplastic lesions                         in the duodenum. Treated with argon plasma coagulation                         (APC).                        - Mucosal variant in the duodenum bulb that was not                         biopsied. The lesion may be a pancreatic rest vs a                         GIST and will need further evaluation.                        -  No specimens collected. Recommendation:        - Return patient to hospital ward for ongoing care.                        - Resume previous diet.                        - Repeat upper endoscopy as an outpatient for biopsies                         of the duodenal lesion.                        - For evaluation of the duodenal lesion. Procedure Code(s):     --- Professional ---                        [phone],  Esophagogastroduodenoscopy, flexible,                         transoral; with control of bleeding, any method Diagnosis Code(s):     --- Professional ---                        D50.0, Iron deficiency anemia secondary to blood loss                         (chronic)                        K31.811, Angiodysplasia of stomach and duodenum with                         bleeding                        K29.71, Gastritis, unspecified, with bleeding CPT copyright 2019 American Medical Association. All rights reserved. The codes documented in this report are preliminary and upon coder review may  be revised to meet current compliance requirements. Lucilla Lame MD, MD 06/26/2020 11:36:13 AM This report has been signed electronically. Number of Addenda: 0 Note Initiated On: 06/26/2020 10:57 AM Estimated Blood Loss:  Estimated blood loss: none.      [facility]

## 2020-06-26 NOTE — Anesthesia Preprocedure Evaluation (Signed)
Anesthesia Evaluation  Patient identified by MRN, date of birth, ID band Patient awake    Reviewed: Allergy & Precautions, NPO status , Patient's Chart, lab work & pertinent test results  History of Anesthesia Complications Negative for: history of anesthetic complications  Airway Mallampati: II       Dental  (+) Edentulous Upper, Edentulous Lower   Pulmonary neg sleep apnea, neg COPD, Current Smoker,           Cardiovascular hypertension, Pt. on medications +CHF  (-) Past MI (-) dysrhythmias (-) Valvular Problems/Murmurs     Neuro/Psych neg Seizures Anxiety    GI/Hepatic Neg liver ROS, GERD  Medicated,  Endo/Other  diabetes, Type 2, Oral Hypoglycemic Agents  Renal/GU Renal InsufficiencyRenal disease     Musculoskeletal   Abdominal   Peds  Hematology   Anesthesia Other Findings   Reproductive/Obstetrics                             Anesthesia Physical Anesthesia Plan  ASA: III  Anesthesia Plan: General   Post-op Pain Management:    Induction: Intravenous  PONV Risk Score and Plan: 2 and Propofol infusion, TIVA and Treatment may vary due to age or medical condition  Airway Management Planned: Nasal Cannula  Additional Equipment:   Intra-op Plan:   Post-operative Plan:   Informed Consent: I have reviewed the patients History and Physical, chart, labs and discussed the procedure including the risks, benefits and alternatives for the proposed anesthesia with the patient or authorized representative who has indicated his/her understanding and acceptance.       Plan Discussed with:   Anesthesia Plan Comments:         Anesthesia Quick Evaluation

## 2020-06-28 ENCOUNTER — Encounter: Payer: Self-pay | Admitting: Gastroenterology

## 2020-06-29 ENCOUNTER — Telehealth: Payer: Self-pay

## 2020-06-29 NOTE — Telephone Encounter (Signed)
Transition Care Management Follow-up Telephone Call  Date of discharge and from where: 06/26/2020, [facility]  How have you been since you were released from the hospital? Patient states that she is doing so much better since her hospitalization.   Any questions or concerns? No  Items Reviewed:  Did the pt receive and understand the discharge instructions provided? Yes   Medications obtained and verified? Yes   Any new allergies since your discharge? No   Dietary orders reviewed? Yes  Do you have support at home? Yes   Functional Questionnaire: (I = Independent and D = Dependent) ADLs: I  Bathing/Dressing- I  Meal Prep- I  Eating- I  Maintaining continence- I  Transferring/Ambulation- I  Managing Meds- I  Follow up appointments reviewed:   PCP Hospital f/u appt confirmed? Yes  Scheduled to see Dr. Silvio Pate on 06/30/2020 @ 12 pm.  [facility] f/u appt confirmed? Yes  Scheduled to see gastroenterology  Are transportation arrangements needed? No   If their condition worsens, is the pt aware to call PCP or go to the Emergency Dept.? Yes  Was the patient provided with contact information for the PCP's office or ED? Yes  Was to pt encouraged to call back with questions or concerns? Yes

## 2020-06-30 ENCOUNTER — Other Ambulatory Visit: Payer: Self-pay

## 2020-06-30 ENCOUNTER — Encounter: Payer: Self-pay | Admitting: Internal Medicine

## 2020-06-30 ENCOUNTER — Ambulatory Visit (INDEPENDENT_AMBULATORY_CARE_PROVIDER_SITE_OTHER): Payer: Medicare HMO | Admitting: Internal Medicine

## 2020-06-30 VITALS — BP 142/68 | HR 91 | Temp 97.8°F | Wt 99.0 lb

## 2020-06-30 DIAGNOSIS — K31811 Angiodysplasia of stomach and duodenum with bleeding: Secondary | ICD-10-CM | POA: Diagnosis not present

## 2020-06-30 DIAGNOSIS — E441 Mild protein-calorie malnutrition: Secondary | ICD-10-CM | POA: Diagnosis not present

## 2020-06-30 DIAGNOSIS — D500 Iron deficiency anemia secondary to blood loss (chronic): Secondary | ICD-10-CM | POA: Diagnosis not present

## 2020-06-30 LAB — RENAL FUNCTION PANEL
Albumin: 4.1 g/dL (ref 3.5–5.2)
BUN: 9 mg/dL (ref 6–23)
CO2: 28 mEq/L (ref 19–32)
Calcium: 10.4 mg/dL (ref 8.4–10.5)
Chloride: 106 mEq/L (ref 96–112)
Creatinine, Ser: 0.97 mg/dL (ref 0.40–1.20)
GFR: 67.14 mL/min (ref >60.00)
Glucose, Bld: 105 mg/dL — ABNORMAL HIGH (ref 70–99)
Phosphorus: 3.5 mg/dL (ref 2.3–4.6)
Potassium: 4.7 mEq/L (ref 3.5–5.1)
Sodium: 142 mEq/L (ref 135–145)

## 2020-06-30 LAB — CBC
HCT: 33.9 % — ABNORMAL LOW (ref 36.0–46.0)
Hemoglobin: 11 g/dL — ABNORMAL LOW (ref 12.0–15.0)
MCHC: 32.6 g/dL (ref 30.0–36.0)
MCV: 83.2 fl (ref 78.0–100.0)
Platelets: 354 10*3/uL (ref 150.0–400.0)
RBC: 4.07 Mil/uL (ref 3.87–5.11)
RDW: 20.1 % — ABNORMAL HIGH (ref 11.5–15.5)
WBC: 7.7 10*3/uL (ref 4.0–10.5)

## 2020-06-30 MED ORDER — SUCRALFATE 1 G PO TABS: 1.0000 g | ORAL_TABLET | Freq: Three times a day (TID) | ORAL | 11 refills | Status: DC

## 2020-06-30 NOTE — Assessment & Plan Note (Signed)
Has lost a little more Not unexpected with hospital stay Hopefully will eat better off the oral iron

## 2020-06-30 NOTE — Patient Outreach (Addendum)
[facility]) Care Management  06/30/2020  Kathryn Ware [birth year] [id number]    EMMI-General Discharge RED ON EMMI ALERT Day # 1 Date: 06/28/2020 Red Alert Reason: "Scheduled follow up appt?No"   Outreach attempt # 1 to patient. Spoke with patient who denies any acute issues or concerns at present and voices she is doing fine.Reviewed and addressed red alert with patient and error in response. She confirms that she goes to see PCP for follow up appt this afternoon and has transportation to appts. GI MD office was mailing patient a letter with appt info. She voices she has all her meds and no issue or concerns regarding them. Advised patient that they would get one more automated EMMI-GENERAL post discharge calls to assess how they are doing following recent hospitalization and will receive a call from a nurse if any of their responses were abnormal. Patient voiced understanding and was appreciative of f/u call.       Plan: RN CM will close case at this time.   Enzo Montgomery, RN,BSN,CCM [facility] Telephonic Care Management Coordinator Direct Phone: [phone] Toll Free: [phone] Fax: [phone]

## 2020-06-30 NOTE — Discharge Summary (Signed)
[place] [facility] [place] NAME: Kathryn Ware    MR#:  [id number]  DATE OF BIRTH:  03/09/1941  DATE OF ADMISSION:  06/24/2020   ADMITTING PHYSICIAN: Christel Mormon, MD  DATE OF DISCHARGE: 06/26/2020  2:45 PM  PRIMARY CARE PHYSICIAN: Venia Carbon, MD   ADMISSION DIAGNOSIS:  GI bleeding [K92.2] Weakness [R53.1] Gastrointestinal hemorrhage, unspecified gastrointestinal hemorrhage type [K92.2] Anemia, unspecified type [D64.9] DISCHARGE DIAGNOSIS:  Active Problems:   GI bleeding   Angiodysplasia of stomach and duodenum with hemorrhage   Gastritis with hemorrhage   Weakness  SECONDARY DIAGNOSIS:   Past Medical History:  Diagnosis Date  . Anemia   . ARF (acute renal failure) ([place])   . Blood transfusion without reported diagnosis   . Diabetes mellitus type II   . GI (gastrointestinal bleed)   . GI AVM (gastrointestinal arteriovenous vascular malformation)    stomach  . History of echocardiogram 12/2013   a. 12/2013: EF 60-65%, DD, mild LVH, nl RV size & systolic function, mild MR/TR, mild AS, nl RVSP; b. TTE 04/2017: EF 60-65%, normal wall motion, GR1DD, mild MR, left atrium normal in size, normal RV systolic function, PASP normal   . History of nuclear stress test    a. 12/2013: low risk, no sig WMA, nondiag EKG 2/2 baseline LVH w/ repol abnl, no sig ischemia, EF 70% no artifact  . HLD (hyperlipidemia)   . HTN (hypertension)   . Hypercholesterolemia   . IDA (iron deficiency anemia)   . Polyp of colon, adenomatous   . RLS (restless legs syndrome)    HOSPITAL COURSE:  79 year old female known history of diabetes, hypertension, hyperlipidemia is admitted for acute blood loss anemia  Acute blood loss anemia likely secondary to GI bleed S/p 3 packed red blood cell transfusion S/p EGD on 9/3 showing normal esophagus, gastritis with hemorrhage s/p APC.  Multiple recent bleeding angiodysplastic lesions in the duodenum treated with argon plasma coagulation/APC.   Mucosal variant in the duodenal bulb could be pancreatic rest versus GIST. -Considering no further GI bleeding and stable H&H GI physician recommended discharge home.  Patient tolerated diet without any difficulties and is being discharged home in stable condition.  Essential hypertension stable  Diabetes sliding scale insulin while in the hospital    DISCHARGE CONDITIONS:  stable CONSULTS OBTAINED:  Treatment Team:  Lucilla Lame, MD DRUG ALLERGIES:   Allergies  Allergen Reactions  . Nifedipine     REACTION: cough  . Metformin Other (See Comments)    REACTION: diarrhea when dosage is increased, ok when taking one tablet daily, patient aware that she is on metformin when it is listed that she is allergic   DISCHARGE MEDICATIONS:   Allergies as of 06/26/2020      Reactions   Nifedipine    REACTION: cough   Metformin Other (See Comments)   REACTION: diarrhea when dosage is increased, ok when taking one tablet daily, patient aware that she is on metformin when it is listed that she is allergic      Medication List    TAKE these medications   Accu-Chek Guide test strip Generic drug: glucose blood 1 each by Other route as needed for other. Use as instructed   Accu-Chek Guide w/Device Kit by Does not apply route.   Accu-Chek Softclix Lancets lancets   acetaminophen 325 MG tablet Commonly known as: TYLENOL Take 2 tablets (650 mg total) by mouth every 6 (six) hours as needed for mild pain (or  Fever >/= 101).   albuterol 108 (90 Base) MCG/ACT inhaler Commonly known as: VENTOLIN HFA INHALE 2 PUFFS INTO THE LUNGS EVERY 6 HOURS AS NEEDED FOR WHEEZING OR SHORTNESS OF BREATH What changed: See the new instructions.   clonazePAM 0.5 MG tablet Commonly known as: KLONOPIN TAKE 1 TO 2 TABLETS(0.5 TO 1 MG) BY MOUTH AT BEDTIME AS NEEDED FOR RESTLESS LEGS What changed: See the new instructions.   ferrous sulfate 325 (65 FE) MG tablet Take 1 tablet (325 mg total) by  mouth 2 (two) times daily.   lisinopril-hydrochlorothiazide 20-12.5 MG tablet Commonly known as: ZESTORETIC Take 2 tablets by mouth daily.   metFORMIN 500 MG 24 hr tablet Commonly known as: GLUCOPHAGE-XR TAKE 1 TABLET(500 MG) BY MOUTH DAILY WITH BREAKFAST What changed: See the new instructions.   omeprazole 40 MG capsule Commonly known as: PRILOSEC Take 1 capsule (40 mg total) by mouth in the morning and at bedtime. What changed: See the new instructions.   simvastatin 80 MG tablet Commonly known as: ZOCOR TAKE 1 TABLET(80 MG) BY MOUTH DAILY   vitamin B-12 1000 MCG tablet Commonly known as: CYANOCOBALAMIN Take 1,000 mcg by mouth daily.      DISCHARGE INSTRUCTIONS:   DIET:  Regular diet DISCHARGE CONDITION:  Stable ACTIVITY:  Activity as tolerated OXYGEN:  Home Oxygen: No.  Oxygen Delivery: room air DISCHARGE LOCATION:  home   If you experience worsening of your admission symptoms, develop shortness of breath, life threatening emergency, suicidal or homicidal thoughts you must seek medical attention immediately by calling 911 or calling your MD immediately  if symptoms less severe.  You Must read complete instructions/literature along with all the possible adverse reactions/side effects for all the Medicines you take and that have been prescribed to you. Take any new Medicines after you have completely understood and accpet all the possible adverse reactions/side effects.   Please note  You were cared for by a hospitalist during your hospital stay. If you have any questions about your discharge medications or the care you received while you were in the hospital after you are discharged, you can call the unit and asked to speak with the hospitalist on call if the hospitalist that took care of you is not available. Once you are discharged, your primary care physician will handle any further medical issues. Please note that NO REFILLS for any discharge medications will be  authorized once you are discharged, as it is imperative that you return to your primary care physician (or establish a relationship with a primary care physician if you do not have one) for your aftercare needs so that they can reassess your need for medications and monitor your lab values.    On the day of Discharge:  VITAL SIGNS:  Blood pressure (!) 155/63, pulse (!) 55, temperature (!) 97.5 F (36.4 C), temperature source Oral, resp. rate 16, height '5\' 4"'  (1.626 m), weight 45.5 kg, SpO2 100 %. PHYSICAL EXAMINATION:  GENERAL:  79 y.o.-year-old patient lying in the bed with no acute distress.  EYES: Pupils equal, round, reactive to light and accommodation. No scleral icterus. Extraocular muscles intact.  HEENT: Head atraumatic, normocephalic. Oropharynx and nasopharynx clear.  NECK:  Supple, no jugular venous distention. No thyroid enlargement, no tenderness.  LUNGS: Normal breath sounds bilaterally, no wheezing, rales,rhonchi or crepitation. No use of accessory muscles of respiration.  CARDIOVASCULAR: S1, S2 normal. No murmurs, rubs, or gallops.  ABDOMEN: Soft, non-tender, non-distended. Bowel sounds present. No organomegaly or mass.  EXTREMITIES:  No pedal edema, cyanosis, or clubbing.  NEUROLOGIC: Cranial nerves II through XII are intact. Muscle strength 5/5 in all extremities. Sensation intact. Gait not checked.  PSYCHIATRIC: The patient is alert and oriented x 3.  SKIN: No obvious rash, lesion, or ulcer.  DATA REVIEW:   CBC Recent Labs  Lab 06/26/20 0335  WBC 6.8  HGB 8.4*  HCT 25.9*  PLT 247    Chemistries  Recent Labs  Lab 06/26/20 0335  NA 140  K 3.6  CL 113*  CO2 19*  GLUCOSE 74  BUN 24*  CREATININE 0.89  CALCIUM 8.5*     Outpatient follow-up  Follow-up Information    Venia Carbon, MD. Schedule an appointment as soon as possible for a visit on 06/30/2020.   Specialties: Internal Medicine, Pediatrics Why: @ 12 noon Contact information: [place] [address] [phone]        Lucilla Lame, MD. Schedule an appointment as soon as possible for a visit in 2 weeks.   Specialty: Gastroenterology Why: Patient will need to make a follow up appointment. Contact information: [place] [place]  [place] [address] [phone]                Management plans discussed with the patient, family and they are in agreement.  CODE STATUS: Prior   TOTAL TIME TAKING CARE OF THIS PATIENT: 45 minutes.    Max Sane M.D on 06/30/2020 at 12:19 PM  [facility]   CC: Primary care physician; Venia Carbon, MD   Note: This dictation was prepared with Dragon dictation along with smaller phrase technology. Any transcriptional errors that result from this process are unintentional.

## 2020-06-30 NOTE — Assessment & Plan Note (Signed)
Has had trouble tolerating oral iron---constipation and may be affecting her appetite Will set up with hematology to consider IV iron therapy

## 2020-06-30 NOTE — Progress Notes (Signed)
Subjective:    Patient ID: Kathryn Ware, female    DOB: 04-05-1941, 79 y.o.   MRN: [id number]  HPI Here for hospital follow up This visit occurred during the SARS-CoV-2 public health emergency.  Safety protocols were in place, including screening questions prior to the visit, additional usage of staff PPE, and extensive cleaning of exam room while observing appropriate contact time as indicated for disinfecting solutions.   Was admitted with fatigue and hemoglobin down to 5.2 on 9/1 Was passing black stool Hospital records reviewed--as well as EGD Given 2 units of blood Hemoglobin as high as 9.2--then back 8.4  EGD showed gastritis and duodenal AVMs---treated with laser Also duodenal mucosal abnormality that will need further evaluation Was sent home after the EGD Taking high dose omeprazole 40 bid Was on sucralfate in the past but she stopped it  Has been on chronic iron---but it causes constipation (so is inconsistent with this)  Feels better now Not tired like she was  Current Outpatient Medications on File Prior to Visit  Medication Sig Dispense Refill  . Accu-Chek Softclix Lancets lancets     . acetaminophen (TYLENOL) 325 MG tablet Take 2 tablets (650 mg total) by mouth every 6 (six) hours as needed for mild pain (or Fever >/= 101).    Marland Kitchen albuterol (VENTOLIN HFA) 108 (90 Base) MCG/ACT inhaler INHALE 2 PUFFS INTO THE LUNGS EVERY 6 HOURS AS NEEDED FOR WHEEZING OR SHORTNESS OF BREATH (Patient taking differently: Inhale 2 puffs into the lungs every 6 (six) hours as needed. INHALE 2 PUFFS INTO THE LUNGS EVERY 6 HOURS AS NEEDED FOR WHEEZING OR SHORTNESS OF BREATH) 8.5 g 0  . Blood Glucose Monitoring Suppl (ACCU-CHEK GUIDE) w/Device KIT by Does not apply route.    . clonazePAM (KLONOPIN) 0.5 MG tablet TAKE 1 TO 2 TABLETS(0.5 TO 1 MG) BY MOUTH AT BEDTIME AS NEEDED FOR RESTLESS LEGS (Patient taking differently: Take 0.5-1 mg by mouth at bedtime as needed (Restless Legs). TAKE 1 TO 2  TABLETS(0.5 TO 1 MG) BY MOUTH AT BEDTIME AS NEEDED FOR RESTLESS LEGS) 60 tablet 0  . ferrous sulfate 325 (65 FE) MG tablet Take 1 tablet (325 mg total) by mouth 2 (two) times daily. 60 tablet 3  . glucose blood (ACCU-CHEK GUIDE) test strip 1 each by Other route as needed for other. Use as instructed    . lisinopril-hydrochlorothiazide (ZESTORETIC) 20-12.5 MG tablet Take 2 tablets by mouth daily. 180 tablet 3  . metFORMIN (GLUCOPHAGE-XR) 500 MG 24 hr tablet TAKE 1 TABLET(500 MG) BY MOUTH DAILY WITH BREAKFAST (Patient taking differently: Take 500 mg by mouth daily with breakfast. ) 30 tablet 11  . omeprazole (PRILOSEC) 40 MG capsule Take 1 capsule (40 mg total) by mouth in the morning and at bedtime. 180 capsule 3  . simvastatin (ZOCOR) 80 MG tablet TAKE 1 TABLET(80 MG) BY MOUTH DAILY 90 tablet 3  . vitamin B-12 (CYANOCOBALAMIN) 1000 MCG tablet Take 1,000 mcg by mouth daily.     No current facility-administered medications on file prior to visit.    Allergies  Allergen Reactions  . Nifedipine     REACTION: cough  . Metformin Other (See Comments)    REACTION: diarrhea when dosage is increased, ok when taking one tablet daily, patient aware that she is on metformin when it is listed that she is allergic    Past Medical History:  Diagnosis Date  . Anemia   . ARF (acute renal failure) ([place])   . Blood  transfusion without reported diagnosis   . Diabetes mellitus type II   . GI (gastrointestinal bleed)   . GI AVM (gastrointestinal arteriovenous vascular malformation)    stomach  . History of echocardiogram 12/2013   a. 12/2013: EF 60-65%, DD, mild LVH, nl RV size & systolic function, mild MR/TR, mild AS, nl RVSP; b. TTE 04/2017: EF 60-65%, normal wall motion, GR1DD, mild MR, left atrium normal in size, normal RV systolic function, PASP normal   . History of nuclear stress test    a. 12/2013: low risk, no sig WMA, nondiag EKG 2/2 baseline LVH w/ repol abnl, no sig ischemia, EF 70% no artifact  .  HLD (hyperlipidemia)   . HTN (hypertension)   . Hypercholesterolemia   . IDA (iron deficiency anemia)   . Polyp of colon, adenomatous   . RLS (restless legs syndrome)     Past Surgical History:  Procedure Laterality Date  . ABDOMINAL ADHESION SURGERY  11/2001   Dr. Tamala Julian  . ABDOMINAL HYSTERECTOMY  1976   RSO (fibroid)  . COLONOSCOPY WITH PROPOFOL N/A 07/07/2016   Procedure: COLONOSCOPY WITH PROPOFOL;  Surgeon: Manya Silvas, MD;  Location: [facility] ENDOSCOPY;  Service: Endoscopy;  Laterality: N/A;  . COLONOSCOPY WITH PROPOFOL N/A 12/14/2016   Procedure: COLONOSCOPY WITH PROPOFOL;  Surgeon: Manya Silvas, MD;  Location: [facility] ENDOSCOPY;  Service: Endoscopy;  Laterality: N/A;  . DOBUTAMINE STRESS ECHO  11/10   normal  . DOPPLER ECHOCARDIOGRAPHY     EF 55%, mild MR, TR  . ENTEROSCOPY N/A 06/08/2018   Procedure: ENTEROSCOPY;  Surgeon: Lin Landsman, MD;  Location: [facility] ENDOSCOPY;  Service: Gastroenterology;  Laterality: N/A;  . ENTEROSCOPY N/A 01/06/2020   Procedure: ENTEROSCOPY;  Surgeon: Lin Landsman, MD;  Location: [facility] ENDOSCOPY;  Service: Gastroenterology;  Laterality: N/A;  . ESOPHAGOGASTRODUODENOSCOPY  07/2006   multiple angioectasias  . ESOPHAGOGASTRODUODENOSCOPY Left 03/12/2015   Procedure: place tube in throat and evaluate stomach and duodenum for source of bleeding. Cauterize if concern for rebleeding.;  Surgeon: Hulen Luster, MD;  Location: [facility] ENDOSCOPY;  Service: Endoscopy;  Laterality: Left;  . ESOPHAGOGASTRODUODENOSCOPY N/A 12/14/2016   Procedure: ESOPHAGOGASTRODUODENOSCOPY (EGD);  Surgeon: Manya Silvas, MD;  Location: [facility] ENDOSCOPY;  Service: Endoscopy;  Laterality: N/A;  . ESOPHAGOGASTRODUODENOSCOPY N/A 07/19/2017   Procedure: ESOPHAGOGASTRODUODENOSCOPY (EGD);  Surgeon: Lin Landsman, MD;  Location: [facility] ENDOSCOPY;  Service: Gastroenterology;  Laterality: N/A;  . ESOPHAGOGASTRODUODENOSCOPY (EGD) WITH PROPOFOL N/A 11/23/2015   Procedure:  ESOPHAGOGASTRODUODENOSCOPY (EGD) WITH PROPOFOL;  Surgeon: Hulen Luster, MD;  Location: [facility] ENDOSCOPY;  Service: Gastroenterology;  Laterality: N/A;  . ESOPHAGOGASTRODUODENOSCOPY (EGD) WITH PROPOFOL N/A 07/07/2016   Procedure: ESOPHAGOGASTRODUODENOSCOPY (EGD) WITH PROPOFOL;  Surgeon: Manya Silvas, MD;  Location: [facility] ENDOSCOPY;  Service: Endoscopy;  Laterality: N/A;  . ESOPHAGOGASTRODUODENOSCOPY (EGD) WITH PROPOFOL N/A 05/19/2017   Procedure: ESOPHAGOGASTRODUODENOSCOPY (EGD) WITH PROPOFOL;  Surgeon: Manya Silvas, MD;  Location: [facility] ENDOSCOPY;  Service: Endoscopy;  Laterality: N/A;  . ESOPHAGOGASTRODUODENOSCOPY (EGD) WITH PROPOFOL N/A 06/26/2020   Procedure: ESOPHAGOGASTRODUODENOSCOPY (EGD) WITH PROPOFOL;  Surgeon: Lucilla Lame, MD;  Location: [facility] ENDOSCOPY;  Service: Endoscopy;  Laterality: N/A;  . laryngeal polyp  10/2008   Dr. Tami Ribas  . TOP      Family History  Problem Relation Age of Onset  . Stroke Father   . Cancer Sister        breast cancer  . Hypertension Mother   . Hypertension Other        sibling  .  Diabetes Other        grandmother    Social History   Socioeconomic History  . Marital status: Widowed    Spouse name: Not on file  . Number of children: 1  . Years of education: Not on file  . Highest education level: Not on file  Occupational History  . Occupation: Regulatory affairs officer    Comment: retired  Tobacco Use  . Smoking status: Current Every Day Smoker    Packs/day: 1.00    Types: Cigarettes  . Smokeless tobacco: Never Used  Vaping Use  . Vaping Use: Never used  Substance and Sexual Activity  . Alcohol use: No  . Drug use: No  . Sexual activity: Not on file  Other Topics Concern  . Not on file  Social History Narrative   Lives with sister      No living will   Requests son Orson Gear as health care POA   Would accept resuscitation --but no prolonged machines   Not sure about feeding tubes   Social Determinants of Health   Financial Resource  Strain:   . Difficulty of Paying Living Expenses: Not on file  Food Insecurity:   . Worried About Charity fundraiser in the Last Year: Not on file  . Ran Out of Food in the Last Year: Not on file  Transportation Needs:   . Lack of Transportation (Medical): Not on file  . Lack of Transportation (Non-Medical): Not on file  Physical Activity:   . Days of Exercise per Week: Not on file  . Minutes of Exercise per Session: Not on file  Stress:   . Feeling of Stress : Not on file  Social Connections:   . Frequency of Communication with Friends and Family: Not on file  . Frequency of Social Gatherings with Friends and Family: Not on file  . Attends Religious Services: Not on file  . Active Member of Clubs or Organizations: Not on file  . Attends Archivist Meetings: Not on file  . Marital Status: Not on file  Intimate Partner Violence:   . Fear of Current or Ex-Partner: Not on file  . Emotionally Abused: Not on file  . Physically Abused: Not on file  . Sexually Abused: Not on file   Review of Systems Appetite is not very good Has lost a few pounds through this Some dark stool now--but has been taking the iron No SOB    Objective:   Physical Exam Constitutional:      Comments: Mild wasting  Cardiovascular:     Rate and Rhythm: Normal rate and regular rhythm.     Heart sounds: No murmur heard.  No gallop.   Pulmonary:     Effort: Pulmonary effort is normal.     Breath sounds: No wheezing or rales.     Comments: Decreased breath sounds but clear Musculoskeletal:     Cervical back: Neck supple.     Right lower leg: No edema.     Left lower leg: No edema.  Lymphadenopathy:     Cervical: No cervical adenopathy.  Neurological:     Mental Status: She is alert.  Psychiatric:        Mood and Affect: Mood normal.        Behavior: Behavior normal.            Assessment & Plan:

## 2020-06-30 NOTE — Assessment & Plan Note (Signed)
Chronic with multiple admissions for severe anemia Had treatment again endoscopically and will follow with GI On high dose omeprazole Will restart sucralfate

## 2020-07-02 ENCOUNTER — Other Ambulatory Visit: Payer: Self-pay

## 2020-07-02 NOTE — Patient Outreach (Signed)
[place] [facility]) Care Management  07/02/2020  Kathryn Ware 11-Apr-1941 [id number]    EMMI-General Discharge RED ON EMMI ALERT Day # 4 Date: 07/01/2020 Red Alert Reason: "Scheduled follow up? No"    Referral received. No outreach warranted at this time. RN CM addressed on previous call. Patient had PCP appt on 06/30/20 and GI MD office mailing appt info via mail.       Plan: RN CM will close case at this time.   Enzo Montgomery, RN,BSN,CCM [place] Management Telephonic Care Management Coordinator Direct Phone: [phone] Toll Free: [phone] Fax: [phone]

## 2020-07-03 ENCOUNTER — Other Ambulatory Visit: Payer: Self-pay

## 2020-07-03 ENCOUNTER — Telehealth: Payer: Self-pay

## 2020-07-03 DIAGNOSIS — K319 Disease of stomach and duodenum, unspecified: Secondary | ICD-10-CM

## 2020-07-03 NOTE — Telephone Encounter (Signed)
Scheduled patient for 07/13/2020 with Dr. Marius Ditch for  A EGD. Went over instructions with patient and mailed them to patient

## 2020-07-03 NOTE — Telephone Encounter (Signed)
-----   Message from Glennie Isle, [place] sent at 07/02/2020  5:05 PM EDT -----  ----- Message ----- From: Lucilla Lame, MD Sent: 06/26/2020  11:40 AM EDT To: Glennie Isle, CMA  This patient needs an EGD as an outpatient with Dr. Marius Ditch due to a lesion in the duodenum.

## 2020-07-06 ENCOUNTER — Other Ambulatory Visit: Payer: Self-pay

## 2020-07-06 ENCOUNTER — Encounter: Payer: Self-pay | Admitting: Internal Medicine

## 2020-07-06 ENCOUNTER — Inpatient Hospital Stay: Payer: Medicare HMO

## 2020-07-06 ENCOUNTER — Inpatient Hospital Stay: Payer: Medicare HMO | Attending: Internal Medicine | Admitting: Internal Medicine

## 2020-07-06 VITALS — BP 146/61 | HR 94 | Temp 96.6°F | Resp 16 | Ht 64.0 in | Wt 96.0 lb

## 2020-07-06 DIAGNOSIS — E785 Hyperlipidemia, unspecified: Secondary | ICD-10-CM | POA: Diagnosis not present

## 2020-07-06 DIAGNOSIS — Q2733 Arteriovenous malformation of digestive system vessel: Secondary | ICD-10-CM

## 2020-07-06 DIAGNOSIS — I10 Essential (primary) hypertension: Secondary | ICD-10-CM | POA: Diagnosis not present

## 2020-07-06 DIAGNOSIS — G2581 Restless legs syndrome: Secondary | ICD-10-CM

## 2020-07-06 DIAGNOSIS — Z8249 Family history of ischemic heart disease and other diseases of the circulatory system: Secondary | ICD-10-CM | POA: Diagnosis not present

## 2020-07-06 DIAGNOSIS — Z79899 Other long term (current) drug therapy: Secondary | ICD-10-CM

## 2020-07-06 DIAGNOSIS — Z90710 Acquired absence of both cervix and uterus: Secondary | ICD-10-CM | POA: Diagnosis not present

## 2020-07-06 DIAGNOSIS — Z803 Family history of malignant neoplasm of breast: Secondary | ICD-10-CM | POA: Diagnosis not present

## 2020-07-06 DIAGNOSIS — K5900 Constipation, unspecified: Secondary | ICD-10-CM

## 2020-07-06 DIAGNOSIS — D649 Anemia, unspecified: Secondary | ICD-10-CM

## 2020-07-06 DIAGNOSIS — Z833 Family history of diabetes mellitus: Secondary | ICD-10-CM

## 2020-07-06 DIAGNOSIS — Z90721 Acquired absence of ovaries, unilateral: Secondary | ICD-10-CM | POA: Diagnosis not present

## 2020-07-06 DIAGNOSIS — F17210 Nicotine dependence, cigarettes, uncomplicated: Secondary | ICD-10-CM

## 2020-07-06 DIAGNOSIS — E119 Type 2 diabetes mellitus without complications: Secondary | ICD-10-CM

## 2020-07-06 DIAGNOSIS — E7800 Pure hypercholesterolemia, unspecified: Secondary | ICD-10-CM | POA: Diagnosis not present

## 2020-07-06 DIAGNOSIS — D500 Iron deficiency anemia secondary to blood loss (chronic): Secondary | ICD-10-CM

## 2020-07-06 DIAGNOSIS — Z7984 Long term (current) use of oral hypoglycemic drugs: Secondary | ICD-10-CM

## 2020-07-06 DIAGNOSIS — Z87891 Personal history of nicotine dependence: Secondary | ICD-10-CM

## 2020-07-06 LAB — IRON AND TIBC
Iron: 44 ug/dL (ref 28–170)
Saturation Ratios: 10 % — ABNORMAL LOW (ref 10.4–31.8)
TIBC: 452 ug/dL — ABNORMAL HIGH (ref 250–450)
UIBC: 408 ug/dL

## 2020-07-06 LAB — FOLATE: Folate: 42 ng/mL (ref >5.9)

## 2020-07-06 LAB — VITAMIN B12: Vitamin B-12: 2208 pg/mL — ABNORMAL HIGH (ref 180–914)

## 2020-07-06 LAB — FERRITIN: Ferritin: 13 ng/mL (ref 11–307)

## 2020-07-06 NOTE — Progress Notes (Signed)
[facility] CONSULT NOTE  Patient Care Team: Venia Carbon, MD as PCP - General  CHIEF COMPLAINTS/PURPOSE OF CONSULTATION: ANEMIA   HEMATOLOGY HISTORY  #Iron deficient anemia chronic [September 2021--5.6; s/p PRBC 3 units]-GI bleeding-gastric/small bowel AVMs status post APC [Dr.Wohl/[place]]  HISTORY OF PRESENTING ILLNESS:  Kathryn Ware 79 y.o.  female has been referred to [place] for further evaluation/work-up for anemia.  Patient was recently admitted to hospital for acute GI bleed and endoscopy showed gastritis with hemorrhaging s/p APC.  She also had multiple bleeding angiodysplastic lesions in the duodenum status post APC.  Also noted to have mucosal variant of the duodenal bulb which was not biopsied needing further repeat outpatient endoscopy.  Patient needed 3 units of PRBC transfusion.  Change in bowel habits-constipation secondary to iron pills. Blood in urine: None Difficulty swallowing: None Abnormal weight loss: None Iron supplementation: Iron pills. Prior Blood transfusions: yes.  Vaginal bleeding: None   Review of Systems  Constitutional: Positive for malaise/fatigue and weight loss. Negative for chills, diaphoresis and fever.  HENT: Negative for nosebleeds and sore throat.   Eyes: Negative for double vision.  Respiratory: Positive for shortness of breath. Negative for cough, hemoptysis, sputum production and wheezing.   Cardiovascular: Negative for chest pain, palpitations, orthopnea and leg swelling.  Gastrointestinal: Negative for abdominal pain, blood in stool, constipation, diarrhea, heartburn, melena, nausea and vomiting.  Genitourinary: Negative for dysuria, frequency and urgency.  Musculoskeletal: Positive for back pain and joint pain.  Skin: Negative.  Negative for itching and rash.  Neurological: Negative for dizziness, tingling, focal weakness, weakness and headaches.  Endo/Heme/Allergies: Does not bruise/bleed easily.   Psychiatric/Behavioral: Negative for depression. The patient is not nervous/anxious and does not have insomnia.     MEDICAL HISTORY:  Past Medical History:  Diagnosis Date  . Anemia   . ARF (acute renal failure) ([place])   . Blood transfusion without reported diagnosis   . Diabetes mellitus type II   . GI (gastrointestinal bleed)   . GI AVM (gastrointestinal arteriovenous vascular malformation)    stomach  . History of echocardiogram 12/2013   a. 12/2013: EF 60-65%, DD, mild LVH, nl RV size & systolic function, mild MR/TR, mild AS, nl RVSP; b. TTE 04/2017: EF 60-65%, normal wall motion, GR1DD, mild MR, left atrium normal in size, normal RV systolic function, PASP normal   . History of nuclear stress test    a. 12/2013: low risk, no sig WMA, nondiag EKG 2/2 baseline LVH w/ repol abnl, no sig ischemia, EF 70% no artifact  . HLD (hyperlipidemia)   . HTN (hypertension)   . Hypercholesterolemia   . IDA (iron deficiency anemia)   . Polyp of colon, adenomatous   . RLS (restless legs syndrome)     SURGICAL HISTORY: Past Surgical History:  Procedure Laterality Date  . ABDOMINAL ADHESION SURGERY  11/2001   Dr. Tamala Julian  . ABDOMINAL HYSTERECTOMY  1976   RSO (fibroid)  . COLONOSCOPY WITH PROPOFOL N/A 07/07/2016   Procedure: COLONOSCOPY WITH PROPOFOL;  Surgeon: Manya Silvas, MD;  Location: [facility] ENDOSCOPY;  Service: Endoscopy;  Laterality: N/A;  . COLONOSCOPY WITH PROPOFOL N/A 12/14/2016   Procedure: COLONOSCOPY WITH PROPOFOL;  Surgeon: Manya Silvas, MD;  Location: [facility] ENDOSCOPY;  Service: Endoscopy;  Laterality: N/A;  . DOBUTAMINE STRESS ECHO  11/10   normal  . DOPPLER ECHOCARDIOGRAPHY     EF 55%, mild MR, TR  . ENTEROSCOPY N/A 06/08/2018   Procedure: ENTEROSCOPY;  Surgeon: Sherri Sear  Reece Levy, MD;  Location: [facility];  Service: Gastroenterology;  Laterality: N/A;  . ENTEROSCOPY N/A 01/06/2020   Procedure: ENTEROSCOPY;  Surgeon: Lin Landsman, MD;  Location: [facility] ENDOSCOPY;   Service: Gastroenterology;  Laterality: N/A;  . ESOPHAGOGASTRODUODENOSCOPY  07/2006   multiple angioectasias  . ESOPHAGOGASTRODUODENOSCOPY Left 03/12/2015   Procedure: place tube in throat and evaluate stomach and duodenum for source of bleeding. Cauterize if concern for rebleeding.;  Surgeon: Hulen Luster, MD;  Location: [facility] ENDOSCOPY;  Service: Endoscopy;  Laterality: Left;  . ESOPHAGOGASTRODUODENOSCOPY N/A 12/14/2016   Procedure: ESOPHAGOGASTRODUODENOSCOPY (EGD);  Surgeon: Manya Silvas, MD;  Location: [facility] ENDOSCOPY;  Service: Endoscopy;  Laterality: N/A;  . ESOPHAGOGASTRODUODENOSCOPY N/A 07/19/2017   Procedure: ESOPHAGOGASTRODUODENOSCOPY (EGD);  Surgeon: Lin Landsman, MD;  Location: [facility] ENDOSCOPY;  Service: Gastroenterology;  Laterality: N/A;  . ESOPHAGOGASTRODUODENOSCOPY (EGD) WITH PROPOFOL N/A 11/23/2015   Procedure: ESOPHAGOGASTRODUODENOSCOPY (EGD) WITH PROPOFOL;  Surgeon: Hulen Luster, MD;  Location: [facility] ENDOSCOPY;  Service: Gastroenterology;  Laterality: N/A;  . ESOPHAGOGASTRODUODENOSCOPY (EGD) WITH PROPOFOL N/A 07/07/2016   Procedure: ESOPHAGOGASTRODUODENOSCOPY (EGD) WITH PROPOFOL;  Surgeon: Manya Silvas, MD;  Location: [facility] ENDOSCOPY;  Service: Endoscopy;  Laterality: N/A;  . ESOPHAGOGASTRODUODENOSCOPY (EGD) WITH PROPOFOL N/A 05/19/2017   Procedure: ESOPHAGOGASTRODUODENOSCOPY (EGD) WITH PROPOFOL;  Surgeon: Manya Silvas, MD;  Location: [facility] ENDOSCOPY;  Service: Endoscopy;  Laterality: N/A;  . ESOPHAGOGASTRODUODENOSCOPY (EGD) WITH PROPOFOL N/A 06/26/2020   Procedure: ESOPHAGOGASTRODUODENOSCOPY (EGD) WITH PROPOFOL;  Surgeon: Lucilla Lame, MD;  Location: [facility] ENDOSCOPY;  Service: Endoscopy;  Laterality: N/A;  . laryngeal polyp  10/2008   Dr. Tami Ribas  . TOP      SOCIAL HISTORY: Social History   Socioeconomic History  . Marital status: Widowed    Spouse name: Not on file  . Number of children: 1  . Years of education: Not on file  . Highest education level: Not on file   Occupational History  . Occupation: Regulatory affairs officer    Comment: retired  Tobacco Use  . Smoking status: Current Every Day Smoker    Packs/day: 1.00    Types: Cigarettes  . Smokeless tobacco: Never Used  Vaping Use  . Vaping Use: Never used  Substance and Sexual Activity  . Alcohol use: No  . Drug use: No  . Sexual activity: Not on file  Other Topics Concern  . Not on file  Social History Narrative   Lives with sister; in [place] since 75 years; 1/2 ppd; no alcohol. Used to work in [place].       No living will   Requests son Orson Gear as health care POA   Would accept resuscitation --but no prolonged machines   Not sure about feeding tubes   Social Determinants of Health   Financial Resource Strain:   . Difficulty of Paying Living Expenses: Not on file  Food Insecurity:   . Worried About Charity fundraiser in the Last Year: Not on file  . Ran Out of Food in the Last Year: Not on file  Transportation Needs:   . Lack of Transportation (Medical): Not on file  . Lack of Transportation (Non-Medical): Not on file  Physical Activity:   . Days of Exercise per Week: Not on file  . Minutes of Exercise per Session: Not on file  Stress:   . Feeling of Stress : Not on file  Social Connections:   . Frequency of Communication with Friends and Family: Not on file  . Frequency of Social Gatherings with Friends  and Family: Not on file  . Attends Religious Services: Not on file  . Active Member of Clubs or Organizations: Not on file  . Attends Archivist Meetings: Not on file  . Marital Status: Not on file  Intimate Partner Violence:   . Fear of Current or Ex-Partner: Not on file  . Emotionally Abused: Not on file  . Physically Abused: Not on file  . Sexually Abused: Not on file    FAMILY HISTORY: Family History  Problem Relation Age of Onset  . Stroke Father   . Cancer Sister        breast cancer  . Hypertension Mother   . Hypertension Other        sibling   . Diabetes Other        grandmother    ALLERGIES:  is allergic to nifedipine and metformin.  MEDICATIONS:  Current Outpatient Medications  Medication Sig Dispense Refill  . albuterol (VENTOLIN HFA) 108 (90 Base) MCG/ACT inhaler INHALE 2 PUFFS INTO THE LUNGS EVERY 6 HOURS AS NEEDED FOR WHEEZING OR SHORTNESS OF BREATH (Patient taking differently: Inhale 2 puffs into the lungs every 6 (six) hours as needed. INHALE 2 PUFFS INTO THE LUNGS EVERY 6 HOURS AS NEEDED FOR WHEEZING OR SHORTNESS OF BREATH) 8.5 g 0  . Blood Glucose Monitoring Suppl (ACCU-CHEK GUIDE) w/Device KIT by Does not apply route.    . clonazePAM (KLONOPIN) 0.5 MG tablet TAKE 1 TO 2 TABLETS(0.5 TO 1 MG) BY MOUTH AT BEDTIME AS NEEDED FOR RESTLESS LEGS (Patient taking differently: Take 0.5-1 mg by mouth at bedtime as needed (Restless Legs). TAKE 1 TO 2 TABLETS(0.5 TO 1 MG) BY MOUTH AT BEDTIME AS NEEDED FOR RESTLESS LEGS) 60 tablet 0  . glucose blood (ACCU-CHEK GUIDE) test strip 1 each by Other route as needed for other. Use as instructed    . lisinopril-hydrochlorothiazide (ZESTORETIC) 20-12.5 MG tablet Take 2 tablets by mouth daily. 180 tablet 3  . metFORMIN (GLUCOPHAGE-XR) 500 MG 24 hr tablet TAKE 1 TABLET(500 MG) BY MOUTH DAILY WITH BREAKFAST (Patient taking differently: Take 500 mg by mouth daily with breakfast. ) 30 tablet 11  . omeprazole (PRILOSEC) 40 MG capsule Take 1 capsule (40 mg total) by mouth in the morning and at bedtime. 180 capsule 3  . simvastatin (ZOCOR) 80 MG tablet TAKE 1 TABLET(80 MG) BY MOUTH DAILY 90 tablet 3  . sucralfate (CARAFATE) 1 g tablet Take 1 tablet (1 g total) by mouth in the morning, at noon, and at bedtime. 90 tablet 11  . vitamin B-12 (CYANOCOBALAMIN) 1000 MCG tablet Take 1,000 mcg by mouth daily.    . Accu-Chek Softclix Lancets lancets     . acetaminophen (TYLENOL) 325 MG tablet Take 2 tablets (650 mg total) by mouth every 6 (six) hours as needed for mild pain (or Fever >/= 101). (Patient not taking:  Reported on 07/06/2020)     No current facility-administered medications for this visit.      PHYSICAL EXAMINATION:   Vitals:   07/06/20 1348  BP: (!) 146/61  Pulse: 94  Resp: 16  Temp: (!) 96.6 F (35.9 C)  SpO2: 100%   Filed Weights   07/06/20 1348  Weight: 96 lb (43.5 kg)    Physical Exam Constitutional:      Comments: Thin frail appearing/cachectic female patient.  Walk independently.  HENT:     Head: Normocephalic and atraumatic.     Mouth/Throat:     Pharynx: No oropharyngeal exudate.  Eyes:  Pupils: Pupils are equal, round, and reactive to light.  Cardiovascular:     Rate and Rhythm: Normal rate and regular rhythm.  Pulmonary:     Effort: No respiratory distress.     Breath sounds: No wheezing.     Comments: Decreased air entry bilaterally.  No wheeze or crackles. Abdominal:     General: Bowel sounds are normal. There is no distension.     Palpations: Abdomen is soft. There is no mass.     Tenderness: There is no abdominal tenderness. There is no guarding or rebound.  Musculoskeletal:        General: No tenderness. Normal range of motion.     Cervical back: Normal range of motion and neck supple.  Skin:    General: Skin is warm.  Neurological:     Mental Status: She is alert and oriented to person, place, and time.  Psychiatric:        Mood and Affect: Affect normal.     LABORATORY DATA:  I have reviewed the data as listed Lab Results  Component Value Date   WBC 7.7 06/30/2020   HGB 11.0 (L) 06/30/2020   HCT 33.9 (L) 06/30/2020   MCV 83.2 06/30/2020   PLT 354.0 06/30/2020   Recent Labs    09/12/19 1549 09/12/19 1549 01/05/20 0843 01/06/20 0710 02/20/20 1606 02/20/20 1606 06/24/20 1702 06/24/20 1702 06/25/20 0500 06/26/20 0335 06/30/20 1254  NA 140   < > 137   < > 140   < > 141   < > 140 140 142  K 4.5   < > 4.6   < > 4.4   < > 4.6   < > 4.1 3.6 4.7  CL 106   < > 107   < > 105   < > 109   < > 111 113* 106  CO2 24   < > 17*   < >  26   < > 19*   < > 20* 19* 28  GLUCOSE 99   < > 258*   < > 90   < > 151*   < > 91 74 105*  BUN 21   < > 60*   < > 25*   < > 41*   < > 40* 24* 9  CREATININE 1.19   < > 1.49*   < > 1.07   < > 1.25*   < > 1.15* 0.89 0.97  CALCIUM 10.5   < > 9.1   < > 10.3   < > 10.2   < > 9.0 8.5* 10.4  GFRNONAA  --   --  34*   < >  --   --  41*  --  46* >60  --   GFRAA  --   --  39*   < >  --   --  48*  --  53* >60  --   PROT 7.8  --  5.6*  --   --   --   --   --   --   --   --   ALBUMIN 4.6   < > 3.0*  --  4.4  --   --   --   --   --  4.1  AST 15  --  20  --   --   --   --   --   --   --   --   ALT 6  --  10  --   --   --   --   --   --   --   --  ALKPHOS 41  --  29*  --   --   --   --   --   --   --   --   BILITOT 0.4  --  0.5  --   --   --   --   --   --   --   --    < > = values in this interval not displayed.     No results found.  Anemia due to gastrointestinal blood loss # Iron deficiency anemia sec to chronic GI bleed [AVMs]-September 2021 hemoglobin-11 [June 24, 2020 hemoglobin 5.6]-currently asymptomatic.  Continue p.o. iron/causing constipation-okay to take stool softener.  Patient reluctant with IV iron.  Will recheck labs iron studies today.  #Gastric/small bowel AVM-s/p EGD argon laser coagulation.  Monitor closely  # Duodenal lesion: Will need a biopsy/work-up with GI; appointment in October.  # smoking: Discussed with the patient regarding the ill effects of smoking- including but not limited to cardiac lung and vascular diseases and malignancies. Counseled against smoking; patient-not interested in quitting.   # LCSP-  Discussed regarding lung cancer screening program at length; which includes low-dose CT scan on annual basis for 5 years.  Based upon the findings patient would be recommended surveillance/biopsies/or surgery.  Lung cancer program has shown to save lives by early detection of lung cancer.  Patient was previously evaluated by lung cancer screening program-supposed to have  a scan in January 2021.  Will refer back to screening program.  Thank you Dr.Letvak for allowing me to participate in the care of your pleasant patient. Please do not hesitate to contact me with questions or concerns in the interim.  # DISPOSITION: # labs today- iron studies/ferritin; B12/folic acid # Follow up in 4 months;MD labs- CBC/CMP; possible venofer- Dr.B  All questions were answered. The patient knows to call the clinic with any problems, questions or concerns.    Cammie Sickle, MD 07/06/2020 2:33 PM

## 2020-07-06 NOTE — Assessment & Plan Note (Signed)
#   Iron deficiency anemia sec to chronic GI bleed [AVMs]-September 2021 hemoglobin-11 [June 24, 2020 hemoglobin 5.6]-currently asymptomatic.  Continue p.o. iron/causing constipation-okay to take stool softener.  Patient reluctant with IV iron.  Will recheck labs iron studies today.  #Gastric/small bowel AVM-s/p EGD argon laser coagulation.  Monitor closely  # Duodenal lesion: Will need a biopsy/work-up with GI; appointment in October.  # smoking: Discussed with the patient regarding the ill effects of smoking- including but not limited to cardiac lung and vascular diseases and malignancies. Counseled against smoking; patient-not interested in quitting.   # LCSP-  Discussed regarding lung cancer screening program at length; which includes low-dose CT scan on annual basis for 5 years.  Based upon the findings patient would be recommended surveillance/biopsies/or surgery.  Lung cancer program has shown to save lives by early detection of lung cancer.  Patient was previously evaluated by lung cancer screening program-supposed to have a scan in January 2021.  Will refer back to screening program.  Thank you Dr.Letvak for allowing me to participate in the care of your pleasant patient. Please do not hesitate to contact me with questions or concerns in the interim.  # DISPOSITION: # labs today- iron studies/ferritin; B12/folic acid # Follow up in 4 months;MD labs- CBC/CMP; possible venofer- Dr.B

## 2020-07-07 ENCOUNTER — Telehealth: Payer: Self-pay

## 2020-07-07 ENCOUNTER — Encounter: Payer: Self-pay | Admitting: *Deleted

## 2020-07-07 ENCOUNTER — Other Ambulatory Visit: Payer: Self-pay | Admitting: *Deleted

## 2020-07-07 DIAGNOSIS — Z87891 Personal history of nicotine dependence: Secondary | ICD-10-CM

## 2020-07-07 DIAGNOSIS — Z122 Encounter for screening for malignant neoplasm of respiratory organs: Secondary | ICD-10-CM

## 2020-07-07 NOTE — Telephone Encounter (Signed)
Contacted patient today for lung CT screening clinic after receiving referral from Dr. Rogue Bussing.  I spoke to patient and she is agreeable to program.  CT scan has been scheduled for Tuesday, September 28 at 1:30.  Address of imaging center given to patient along with phone number for Burgess Estelle, Lung navigator.  Patient's sister drives her to appointments so sister will call if she needs to change time of scan.  Patient is a current smoker and started smoking at age 34.  She states she smokes almost a whole pack a day, between 75% to 100% of a pack.

## 2020-07-07 NOTE — Progress Notes (Signed)
Current smoker, 47.25 pack year

## 2020-07-08 ENCOUNTER — Other Ambulatory Visit: Payer: Self-pay | Admitting: Internal Medicine

## 2020-07-08 NOTE — Telephone Encounter (Signed)
Last filled 06-08-20 #60 Last OV 06-30-20 Next OV 10-05-20 [facility]

## 2020-07-09 ENCOUNTER — Other Ambulatory Visit: Admission: RE | Admit: 2020-07-09 | Payer: Medicare HMO | Source: Ambulatory Visit

## 2020-07-10 ENCOUNTER — Telehealth: Payer: Self-pay

## 2020-07-10 NOTE — Telephone Encounter (Signed)
Patient did not go for COVID test. Called patient and she states she can not go for COVID test today. Moved patient to 07/21/2020 and then she will go for COVID test on 07/17/2020

## 2020-07-21 ENCOUNTER — Encounter: Admission: RE | Payer: Self-pay | Source: Home / Self Care

## 2020-07-21 ENCOUNTER — Telehealth: Payer: Medicare HMO | Admitting: Nurse Practitioner

## 2020-07-21 ENCOUNTER — Ambulatory Visit: Admission: RE | Admit: 2020-07-21 | Payer: Medicare HMO | Source: Home / Self Care | Admitting: Gastroenterology

## 2020-07-21 ENCOUNTER — Ambulatory Visit: Payer: Medicare HMO

## 2020-07-21 SURGERY — ESOPHAGOGASTRODUODENOSCOPY (EGD) WITH PROPOFOL
Anesthesia: General

## 2020-07-28 ENCOUNTER — Ambulatory Visit: Payer: Medicare HMO | Attending: Nurse Practitioner

## 2020-07-28 ENCOUNTER — Inpatient Hospital Stay: Payer: Medicare HMO | Attending: Nurse Practitioner | Admitting: Nurse Practitioner

## 2020-07-28 ENCOUNTER — Encounter: Payer: Self-pay | Admitting: Oncology

## 2020-08-08 ENCOUNTER — Other Ambulatory Visit: Payer: Self-pay | Admitting: Internal Medicine

## 2020-08-10 NOTE — Telephone Encounter (Signed)
Last filled 07-08-20 #60 Last OV 06-30-20 Next OV 10-05-20 [facility]

## 2020-08-11 ENCOUNTER — Telehealth: Payer: Self-pay | Admitting: Internal Medicine

## 2020-08-11 ENCOUNTER — Other Ambulatory Visit: Payer: Self-pay | Admitting: Internal Medicine

## 2020-08-11 MED ORDER — CLONAZEPAM 0.5 MG PO TABS
ORAL_TABLET | ORAL | 0 refills | Status: DC
Start: 2020-08-11 — End: 2020-09-11

## 2020-08-11 NOTE — Telephone Encounter (Signed)
Pt's nephew called and say pt needs refills on Clonazepam 0.5mg  and the prescription needs to be faxed to a different [facility] b/c her usual [facility] location's system is down (patient was w/Mr. Bobbye Charleston and verified this info).  Please fax to [phone], that is to [facility] at [address] [place], [place] [address]. Pt is completely out of medication.  Thank you!

## 2020-08-11 NOTE — Telephone Encounter (Signed)
Refill done.  

## 2020-08-11 NOTE — Telephone Encounter (Signed)
Pt called needing clonzaepam  transferred to  [facility] on [address]   Pt is out of meds

## 2020-08-12 ENCOUNTER — Other Ambulatory Visit: Payer: Self-pay | Admitting: Internal Medicine

## 2020-08-12 ENCOUNTER — Ambulatory Visit: Payer: Medicare HMO | Admitting: Gastroenterology

## 2020-08-22 ENCOUNTER — Other Ambulatory Visit: Payer: Self-pay

## 2020-08-22 ENCOUNTER — Encounter
Admission: EM | Disposition: A | Discharge status: Home / Self Care | Payer: Self-pay | Source: Home / Self Care | Attending: Internal Medicine

## 2020-08-22 ENCOUNTER — Inpatient Hospital Stay: Payer: Medicare HMO | Admitting: Anesthesiology

## 2020-08-22 ENCOUNTER — Inpatient Hospital Stay
Admission: EM | Admit: 2020-08-22 | Discharge: 2020-08-24 | DRG: 378 | Disposition: A | Discharge status: Home / Self Care | Payer: Medicare HMO | Attending: Internal Medicine | Admitting: Internal Medicine

## 2020-08-22 DIAGNOSIS — I5032 Chronic diastolic (congestive) heart failure: Secondary | ICD-10-CM | POA: Diagnosis present

## 2020-08-22 DIAGNOSIS — E1151 Type 2 diabetes mellitus with diabetic peripheral angiopathy without gangrene: Secondary | ICD-10-CM | POA: Diagnosis present

## 2020-08-22 DIAGNOSIS — G2581 Restless legs syndrome: Secondary | ICD-10-CM | POA: Diagnosis present

## 2020-08-22 DIAGNOSIS — R531 Weakness: Secondary | ICD-10-CM | POA: Diagnosis not present

## 2020-08-22 DIAGNOSIS — F17210 Nicotine dependence, cigarettes, uncomplicated: Secondary | ICD-10-CM | POA: Diagnosis present

## 2020-08-22 DIAGNOSIS — I700 Atherosclerosis of aorta: Secondary | ICD-10-CM | POA: Diagnosis present

## 2020-08-22 DIAGNOSIS — R7989 Other specified abnormal findings of blood chemistry: Secondary | ICD-10-CM

## 2020-08-22 DIAGNOSIS — K558 Other vascular disorders of intestine: Secondary | ICD-10-CM

## 2020-08-22 DIAGNOSIS — E7800 Pure hypercholesterolemia, unspecified: Secondary | ICD-10-CM | POA: Diagnosis present

## 2020-08-22 DIAGNOSIS — K31811 Angiodysplasia of stomach and duodenum with bleeding: Secondary | ICD-10-CM | POA: Diagnosis not present

## 2020-08-22 DIAGNOSIS — R0689 Other abnormalities of breathing: Secondary | ICD-10-CM | POA: Diagnosis not present

## 2020-08-22 DIAGNOSIS — G4489 Other headache syndrome: Secondary | ICD-10-CM | POA: Diagnosis not present

## 2020-08-22 DIAGNOSIS — K922 Gastrointestinal hemorrhage, unspecified: Secondary | ICD-10-CM | POA: Diagnosis present

## 2020-08-22 DIAGNOSIS — I130 Hypertensive heart and chronic kidney disease with heart failure and stage 1 through stage 4 chronic kidney disease, or unspecified chronic kidney disease: Secondary | ICD-10-CM | POA: Diagnosis present

## 2020-08-22 DIAGNOSIS — E785 Hyperlipidemia, unspecified: Secondary | ICD-10-CM | POA: Diagnosis not present

## 2020-08-22 DIAGNOSIS — R0600 Dyspnea, unspecified: Secondary | ICD-10-CM

## 2020-08-22 DIAGNOSIS — Z8249 Family history of ischemic heart disease and other diseases of the circulatory system: Secondary | ICD-10-CM

## 2020-08-22 DIAGNOSIS — N179 Acute kidney failure, unspecified: Secondary | ICD-10-CM | POA: Diagnosis present

## 2020-08-22 DIAGNOSIS — E860 Dehydration: Secondary | ICD-10-CM | POA: Diagnosis present

## 2020-08-22 DIAGNOSIS — Z7984 Long term (current) use of oral hypoglycemic drugs: Secondary | ICD-10-CM

## 2020-08-22 DIAGNOSIS — R0609 Other forms of dyspnea: Secondary | ICD-10-CM

## 2020-08-22 DIAGNOSIS — Z79899 Other long term (current) drug therapy: Secondary | ICD-10-CM | POA: Diagnosis not present

## 2020-08-22 DIAGNOSIS — N183 Chronic kidney disease, stage 3 unspecified: Secondary | ICD-10-CM | POA: Diagnosis present

## 2020-08-22 DIAGNOSIS — I248 Other forms of acute ischemic heart disease: Secondary | ICD-10-CM | POA: Diagnosis present

## 2020-08-22 DIAGNOSIS — Z823 Family history of stroke: Secondary | ICD-10-CM

## 2020-08-22 DIAGNOSIS — I472 Ventricular tachycardia: Secondary | ICD-10-CM | POA: Diagnosis not present

## 2020-08-22 DIAGNOSIS — Z20822 Contact with and (suspected) exposure to covid-19: Secondary | ICD-10-CM | POA: Diagnosis not present

## 2020-08-22 DIAGNOSIS — E1122 Type 2 diabetes mellitus with diabetic chronic kidney disease: Secondary | ICD-10-CM | POA: Diagnosis present

## 2020-08-22 DIAGNOSIS — E1169 Type 2 diabetes mellitus with other specified complication: Secondary | ICD-10-CM | POA: Diagnosis present

## 2020-08-22 DIAGNOSIS — R000 Tachycardia, unspecified: Secondary | ICD-10-CM

## 2020-08-22 DIAGNOSIS — D500 Iron deficiency anemia secondary to blood loss (chronic): Secondary | ICD-10-CM | POA: Diagnosis present

## 2020-08-22 DIAGNOSIS — D649 Anemia, unspecified: Secondary | ICD-10-CM | POA: Diagnosis present

## 2020-08-22 DIAGNOSIS — K5520 Angiodysplasia of colon without hemorrhage: Secondary | ICD-10-CM | POA: Diagnosis not present

## 2020-08-22 DIAGNOSIS — I959 Hypotension, unspecified: Secondary | ICD-10-CM | POA: Diagnosis not present

## 2020-08-22 DIAGNOSIS — D62 Acute posthemorrhagic anemia: Secondary | ICD-10-CM | POA: Diagnosis not present

## 2020-08-22 DIAGNOSIS — K219 Gastro-esophageal reflux disease without esophagitis: Secondary | ICD-10-CM | POA: Diagnosis present

## 2020-08-22 DIAGNOSIS — I10 Essential (primary) hypertension: Secondary | ICD-10-CM | POA: Diagnosis present

## 2020-08-22 DIAGNOSIS — R778 Other specified abnormalities of plasma proteins: Secondary | ICD-10-CM

## 2020-08-22 DIAGNOSIS — Z833 Family history of diabetes mellitus: Secondary | ICD-10-CM | POA: Diagnosis not present

## 2020-08-22 DIAGNOSIS — Q2733 Arteriovenous malformation of digestive system vessel: Secondary | ICD-10-CM | POA: Diagnosis not present

## 2020-08-22 DIAGNOSIS — D631 Anemia in chronic kidney disease: Secondary | ICD-10-CM | POA: Diagnosis not present

## 2020-08-22 HISTORY — PX: ENTEROSCOPY: SHX5533

## 2020-08-22 LAB — COMPREHENSIVE METABOLIC PANEL
ALT: 8 U/L (ref 0–44)
AST: 24 U/L (ref 15–41)
Albumin: 3.9 g/dL (ref 3.5–5.0)
Alkaline Phosphatase: 40 U/L (ref 38–126)
Anion gap: 9 (ref 5–15)
BUN: 36 mg/dL — ABNORMAL HIGH (ref 8–23)
CO2: 21 mmol/L — ABNORMAL LOW (ref 22–32)
Calcium: 9.3 mg/dL (ref 8.9–10.3)
Chloride: 108 mmol/L (ref 98–111)
Creatinine, Ser: 1.26 mg/dL — ABNORMAL HIGH (ref 0.44–1.00)
GFR, Estimated: 44 mL/min — ABNORMAL LOW (ref >60)
Glucose, Bld: 199 mg/dL — ABNORMAL HIGH (ref 70–99)
Potassium: 4.8 mmol/L (ref 3.5–5.1)
Sodium: 138 mmol/L (ref 135–145)
Total Bilirubin: 0.6 mg/dL (ref 0.3–1.2)
Total Protein: 6.9 g/dL (ref 6.5–8.1)

## 2020-08-22 LAB — LACTIC ACID, PLASMA
Lactic Acid, Venous: 2.2 mmol/L (ref 0.5–1.9)
Lactic Acid, Venous: 2 mmol/L (ref 0.5–1.9)

## 2020-08-22 LAB — GLUCOSE, CAPILLARY
Glucose-Capillary: 90 mg/dL (ref 70–99)
Glucose-Capillary: 95 mg/dL (ref 70–99)
Glucose-Capillary: 102 mg/dL — ABNORMAL HIGH (ref 70–99)
Glucose-Capillary: 154 mg/dL — ABNORMAL HIGH (ref 70–99)

## 2020-08-22 LAB — CBC WITH DIFFERENTIAL/PLATELET
Abs Immature Granulocytes: 0.01 10*3/uL (ref 0.00–0.07)
Basophils Absolute: 0 10*3/uL (ref 0.0–0.1)
Basophils Relative: 0 %
Eosinophils Absolute: 0.1 10*3/uL (ref 0.0–0.5)
Eosinophils Relative: 1 %
HCT: 21.1 % — ABNORMAL LOW (ref 36.0–46.0)
Hemoglobin: 6.5 g/dL — ABNORMAL LOW (ref 12.0–15.0)
Immature Granulocytes: 0 %
Lymphocytes Relative: 22 %
Lymphs Abs: 1.1 10*3/uL (ref 0.7–4.0)
MCH: 25.7 pg — ABNORMAL LOW (ref 26.0–34.0)
MCHC: 30.8 g/dL (ref 30.0–36.0)
MCV: 83.4 fL (ref 80.0–100.0)
Monocytes Absolute: 0.5 10*3/uL (ref 0.1–1.0)
Monocytes Relative: 9 %
Neutro Abs: 3.4 10*3/uL (ref 1.7–7.7)
Neutrophils Relative %: 68 %
Platelets: 250 10*3/uL (ref 150–400)
RBC: 2.53 MIL/uL — ABNORMAL LOW (ref 3.87–5.11)
RDW: 17.3 % — ABNORMAL HIGH (ref 11.5–15.5)
WBC: 5 10*3/uL (ref 4.0–10.5)
nRBC: 0 % (ref 0.0–0.2)

## 2020-08-22 LAB — URINALYSIS, COMPLETE (UACMP) WITH MICROSCOPIC
Bacteria, UA: NONE SEEN
Bilirubin Urine: NEGATIVE
Glucose, UA: NEGATIVE mg/dL
Hgb urine dipstick: NEGATIVE
Ketones, ur: NEGATIVE mg/dL
Leukocytes,Ua: NEGATIVE
Nitrite: NEGATIVE
Protein, ur: NEGATIVE mg/dL
Specific Gravity, Urine: 1.016 (ref 1.005–1.030)
pH: 6 (ref 5.0–8.0)

## 2020-08-22 LAB — CBG MONITORING, ED
Glucose-Capillary: 87 mg/dL (ref 70–99)
Glucose-Capillary: 81 mg/dL (ref 70–99)

## 2020-08-22 LAB — HEMOGLOBIN A1C
Hgb A1c MFr Bld: 5.9 % — ABNORMAL HIGH (ref 4.8–5.6)
Mean Plasma Glucose: 122.63 mg/dL

## 2020-08-22 LAB — RESPIRATORY PANEL BY RT PCR (FLU A&B, COVID)
Influenza A by PCR: NEGATIVE
Influenza B by PCR: NEGATIVE
SARS Coronavirus 2 by RT PCR: NEGATIVE

## 2020-08-22 LAB — LIPASE, BLOOD: Lipase: 34 U/L (ref 11–51)

## 2020-08-22 LAB — TROPONIN I (HIGH SENSITIVITY)
Troponin I (High Sensitivity): 30 ng/L — ABNORMAL HIGH (ref <18)
Troponin I (High Sensitivity): 30 ng/L — ABNORMAL HIGH (ref <18)

## 2020-08-22 LAB — HEMOGLOBIN: Hemoglobin: 10.9 g/dL — ABNORMAL LOW (ref 12.0–15.0)

## 2020-08-22 LAB — PREPARE RBC (CROSSMATCH)

## 2020-08-22 SURGERY — ENTEROSCOPY
Anesthesia: General

## 2020-08-22 MED ORDER — PROPOFOL 500 MG/50ML IV EMUL
INTRAVENOUS | Status: DC | PRN
  Administered 2020-08-22: 70 ug via INTRAVENOUS

## 2020-08-22 MED ORDER — MORPHINE SULFATE (PF) 2 MG/ML IV SOLN: 2.0000 mg | INTRAVENOUS | Status: DC | PRN

## 2020-08-22 MED ORDER — ATORVASTATIN CALCIUM 20 MG PO TABS
40.0000 mg | ORAL_TABLET | Freq: Every day | ORAL | Status: DC
  Administered 2020-08-23: 40 mg via ORAL
  Filled 2020-08-22: qty 2

## 2020-08-22 MED ORDER — PROPOFOL 10 MG/ML IV BOLUS
INTRAVENOUS | Status: AC
  Filled 2020-08-22: qty 20

## 2020-08-22 MED ORDER — LIDOCAINE HCL (CARDIAC) PF 100 MG/5ML IV SOSY
PREFILLED_SYRINGE | INTRAVENOUS | Status: DC | PRN
  Administered 2020-08-22: 90 mg via INTRAVENOUS

## 2020-08-22 MED ORDER — INSULIN ASPART 100 UNIT/ML ~~LOC~~ SOLN: 0.0000 [IU] | SUBCUTANEOUS | Status: DC

## 2020-08-22 MED ORDER — SODIUM CHLORIDE 0.9 % IV SOLN: INTRAVENOUS | Status: DC

## 2020-08-22 MED ORDER — PROPOFOL 500 MG/50ML IV EMUL
INTRAVENOUS | Status: DC | PRN
  Administered 2020-08-22: 150 ug/kg/min via INTRAVENOUS

## 2020-08-22 MED ORDER — METOPROLOL TARTRATE 5 MG/5ML IV SOLN: 5.0000 mg | INTRAVENOUS | Status: DC | PRN

## 2020-08-22 MED ORDER — ONDANSETRON HCL 4 MG/2ML IJ SOLN: 4.0000 mg | Freq: Four times a day (QID) | INTRAMUSCULAR | Status: DC | PRN

## 2020-08-22 MED ORDER — SODIUM CHLORIDE 0.9 % IV SOLN
10.0000 mL/h | Freq: Once | INTRAVENOUS | Status: AC
  Administered 2020-08-22: 10 mL/h via INTRAVENOUS

## 2020-08-22 MED ORDER — SODIUM CHLORIDE 0.9 % IV SOLN
80.0000 mg | Freq: Once | INTRAVENOUS | Status: AC
  Administered 2020-08-22: 80 mg via INTRAVENOUS
  Filled 2020-08-22: qty 80

## 2020-08-22 MED ORDER — ALBUTEROL SULFATE HFA 108 (90 BASE) MCG/ACT IN AERS
2.0000 | INHALATION_SPRAY | Freq: Four times a day (QID) | RESPIRATORY_TRACT | Status: DC | PRN
  Filled 2020-08-22: qty 6.7

## 2020-08-22 MED ORDER — CLONAZEPAM 0.5 MG PO TABS
0.5000 mg | ORAL_TABLET | Freq: Every evening | ORAL | Status: DC | PRN
  Administered 2020-08-22 – 2020-08-23 (×2): 0.5 mg via ORAL
  Filled 2020-08-22 (×2): qty 1

## 2020-08-22 MED ORDER — ACETAMINOPHEN 325 MG PO TABS: 650.0000 mg | ORAL_TABLET | Freq: Four times a day (QID) | ORAL | Status: DC | PRN

## 2020-08-22 MED ORDER — SODIUM CHLORIDE 0.9 % IV SOLN
8.0000 mg/h | INTRAVENOUS | Status: DC
  Administered 2020-08-22 – 2020-08-24 (×4): 8 mg/h via INTRAVENOUS
  Filled 2020-08-22 (×4): qty 80

## 2020-08-22 MED ORDER — LIDOCAINE HCL (PF) 2 % IJ SOLN
INTRAMUSCULAR | Status: AC
  Filled 2020-08-22: qty 5

## 2020-08-22 MED ORDER — SODIUM CHLORIDE 0.9 % IV BOLUS
250.0000 mL | Freq: Once | INTRAVENOUS | Status: AC
  Administered 2020-08-22: 250 mL via INTRAVENOUS

## 2020-08-22 MED ORDER — ONDANSETRON HCL 4 MG PO TABS: 4.0000 mg | ORAL_TABLET | Freq: Four times a day (QID) | ORAL | Status: DC | PRN

## 2020-08-22 MED ORDER — ACETAMINOPHEN 650 MG RE SUPP: 650.0000 mg | Freq: Four times a day (QID) | RECTAL | Status: DC | PRN

## 2020-08-22 NOTE — Transfer of Care (Signed)
Immediate Anesthesia Transfer of Care Note  Patient: Kathryn Ware  Procedure(s) Performed: ENTEROSCOPY (N/A )  Patient Location: PACU  Anesthesia Type:MAC  Level of Consciousness: drowsy and patient cooperative  Airway & Oxygen Therapy: Patient Spontanous Breathing and Patient connected to nasal cannula oxygen  Post-op Assessment: Report given to RN and Post -op Vital signs reviewed and stable  Post vital signs: Reviewed and stable  Last Vitals:  Vitals Value Taken Time  BP 109/37   Temp 98.8   Pulse 91 08/22/20 1834  Resp 18   SpO2 100 % 08/22/20 1834  Vitals shown include unvalidated device data.  Last Pain:  Vitals:   08/22/20 1645  TempSrc: Oral  PainSc:          Complications: No complications documented.

## 2020-08-22 NOTE — ED Notes (Signed)
Spoke with hospitalist to clarify whether 1 or 2 units blood ordered for this pt. Per Dr Leslye Peer, no need to transfuse second unit of blood at this time.

## 2020-08-22 NOTE — Progress Notes (Signed)
Patient ID: Kathryn Ware, female   DOB: 15-Dec-1940, 79 y.o.   MRN: [id number] [facility] PROGRESS NOTE  Kathryn Ware:[id number] DOB: 12-16-1940 DOA: 08/22/2020 PCP: Venia Carbon, MD  HPI/Subjective: Patient came in with feeling sick.  She has been tired and dizzy and feeling like she is going to faint.  This has been going on for about a week.  She has a history of AVMs that were cauterized in the past.  Patient was given 2 units of packed red blood cells and last hemoglobin did come up to 10.9.  Patient asked when she can go home.  Endoscopy scheduled for around 3 PM.  Patient states that she takes iron and her stools have been dark. Objective: Vitals:   08/22/20 0817 08/22/20 1046  BP: 105/61 122/66  Pulse: 80 84  Resp: (!) 22 18  Temp: 97.8 F (36.6 C) 97.8 F (36.6 C)  SpO2: 99% 100%    Intake/Output Summary (Last 24 hours) at 08/22/2020 1319 Last data filed at 08/22/2020 1045 Gross per 24 hour  Intake 300 ml  Output --  Net 300 ml   Filed Weights   08/22/20 0309  Weight: 46.7 kg    ROS: Review of Systems  Respiratory: Negative for shortness of breath.   Cardiovascular: Negative for chest pain.  Gastrointestinal: Negative for abdominal pain, nausea and vomiting.   Exam: Physical Exam HENT:     Head: Normocephalic.     Mouth/Throat:     Pharynx: No oropharyngeal exudate.  Eyes:     General: Lids are normal.     Conjunctiva/sclera: Conjunctivae normal.     Pupils: Pupils are equal, round, and reactive to light.  Cardiovascular:     Rate and Rhythm: Normal rate and regular rhythm.     Heart sounds: Normal heart sounds, S1 normal and S2 normal.  Pulmonary:     Breath sounds: No decreased breath sounds, wheezing, rhonchi or rales.  Abdominal:     Palpations: Abdomen is soft.     Tenderness: There is no abdominal tenderness.  Musculoskeletal:     Right ankle: No swelling.     Left ankle: No swelling.  Skin:    General: Skin is warm.      Findings: No rash.  Neurological:     Mental Status: She is alert and oriented to person, place, and time.       Data Reviewed: Basic Metabolic Panel: Recent Labs  Lab 08/22/20 0306  NA 138  K 4.8  CL 108  CO2 21*  GLUCOSE 199*  BUN 36*  CREATININE 1.26*  CALCIUM 9.3   Liver Function Tests: Recent Labs  Lab 08/22/20 0306  AST 24  ALT 8  ALKPHOS 40  BILITOT 0.6  PROT 6.9  ALBUMIN 3.9   Recent Labs  Lab 08/22/20 0306  LIPASE 34   CBC: Recent Labs  Lab 08/22/20 0306 08/22/20 0820  WBC 5.0  --   NEUTROABS 3.4  --   HGB 6.5* 10.9*  HCT 21.1*  --   MCV 83.4  --   PLT 250  --    BNP (last 3 results) Recent Labs    01/05/20 0843  BNP 343.0*     CBG: Recent Labs  Lab 08/22/20 0807 08/22/20 1232  GLUCAP 87 81    Recent Results (from the past 240 hour(s))  Respiratory Panel by RT PCR (Flu A&B, Covid) - Nasopharyngeal Swab     Status: None   Collection Time: 08/22/20  4:38 AM   Specimen: Nasopharyngeal Swab  Result Value Ref Range Status   SARS Coronavirus 2 by RT PCR NEGATIVE NEGATIVE Final    Comment: (NOTE) SARS-CoV-2 target nucleic acids are NOT DETECTED.  The SARS-CoV-2 RNA is generally detectable in upper respiratoy specimens during the acute phase of infection. The lowest concentration of SARS-CoV-2 viral copies this assay can detect is 131 copies/mL. A negative result does not preclude SARS-Cov-2 infection and should not be used as the sole basis for treatment or other patient management decisions. A negative result may occur with  improper specimen collection/handling, submission of specimen other than nasopharyngeal swab, presence of viral mutation(s) within the areas targeted by this assay, and inadequate number of viral copies (<131 copies/mL). A negative result must be combined with clinical observations, patient history, and epidemiological information. The expected result is Negative.  Fact Sheet for Patients:   PinkCheek.be  Fact Sheet for Healthcare Providers:  GravelBags.it  This test is no t yet approved or cleared by the [place] FDA and  has been authorized for detection and/or diagnosis of SARS-CoV-2 by FDA under an Emergency Use Authorization (EUA). This EUA will remain  in effect (meaning this test can be used) for the duration of the COVID-19 declaration under Section 564(b)(1) of the Act, 21 U.S.C. section 360bbb-3(b)(1), unless the authorization is terminated or revoked sooner.     Influenza A by PCR NEGATIVE NEGATIVE Final   Influenza B by PCR NEGATIVE NEGATIVE Final    Comment: (NOTE) The Xpert Xpress SARS-CoV-2/FLU/RSV assay is intended as an aid in  the diagnosis of influenza from Nasopharyngeal swab specimens and  should not be used as a sole basis for treatment. Nasal washings and  aspirates are unacceptable for Xpert Xpress SARS-CoV-2/FLU/RSV  testing.  Fact Sheet for Patients: PinkCheek.be  Fact Sheet for Healthcare Providers: GravelBags.it  This test is not yet approved or cleared by the [place] FDA and  has been authorized for detection and/or diagnosis of SARS-CoV-2 by  FDA under an Emergency Use Authorization (EUA). This EUA will remain  in effect (meaning this test can be used) for the duration of the  Covid-19 declaration under Section 564(b)(1) of the Act, 21  U.S.C. section 360bbb-3(b)(1), unless the authorization is  terminated or revoked. Performed at [facility], [address], [place],  [address]       Scheduled Meds: . atorvastatin  40 mg Oral q1800  . insulin aspart  0-9 Units Subcutaneous Q4H   Continuous Infusions: . sodium chloride    . sodium chloride    . pantoprozole (PROTONIX) infusion 8 mg/hr (08/22/20 0759)    Assessment/Plan:  1. Acute on chronic blood loss anemia.  Patient's initial  hemoglobin was 6.5.  Given 2 units of packed red blood cells and came up to 10.9.  Recheck tomorrow morning.  We will give IV iron later on today.  She did have an endoscopy around 3 PM today.  Patient does have a history of gastric AVMs which were treated with argon plasma coagulation previously.  Patient on Protonix drip.  Case discussed with gastroenterology currently n.p.o. 2. Acute kidney injury with GI bleed.  Gentle IV fluid hydration. 3. Type 2 diabetes mellitus with hyperlipidemia.  Hemoglobin A1c is pending.  Patient is on sliding scale insulin.  Continue Lipitor 4. Essential hypertension holding lisinopril HCT blood pressure currently good.    Code Status:     Code Status Orders  (From admission, onward)  Start     Ordered   08/22/20 0501  Full code  Continuous        08/22/20 0502        Code Status History    Date Active Date Inactive Code Status Order ID Comments User Context   06/24/2020 1936 06/26/2020 1955 Full Code [id number]  Mansy, Arvella Merles, MD ED   01/05/2020 1402 01/07/2020 2055 Full Code [id number]  Ivor Costa, MD ED   06/07/2018 1746 06/09/2018 1716 Full Code [id number]  Demetrios Loll, MD Inpatient   07/18/2017 1823 07/19/2017 2110 Full Code [id number]  Baxter Hire, MD Inpatient   07/14/2017 2248 07/15/2017 1752 Full Code [id number]  Lance Coon, MD Inpatient   05/10/2017 2000 05/12/2017 1404 Full Code [id number]  Vaughan Basta, MD Inpatient   07/05/2016 1301 07/08/2016 1402 Full Code [id number]  Fritzi Mandes, MD Inpatient   10/13/2015 2053 10/14/2015 1636 Full Code [id number]  Dustin Flock, MD Inpatient   03/10/2015 2131 03/13/2015 1311 Full Code [id number]  Theodoro Grist, MD Inpatient   Advance Care Planning Activity     Family Communication: Spoke with patient's sister on the phone Disposition Plan: Status is: Inpatient  Dispo: The patient is from: Home              Anticipated d/c is to: Home              Anticipated d/c date is: Earliest potential will be  08/23/2020 depending on endoscopy results and GI recommendations              Patient currently being treated for GI bleed and acute on chronic blood loss anemia  Consultants:  Gastroenterology  Time spent: 34 minutes, case discussed with gastroenterology  [place]

## 2020-08-22 NOTE — Progress Notes (Signed)
Patient's HR trending in 100's to 112 at rest. Patient asymptomatic. 250 mg bolus given per Dr. Leslye Peer.   Fuller Mandril, RN

## 2020-08-22 NOTE — Anesthesia Preprocedure Evaluation (Signed)
Anesthesia Evaluation  Patient identified by MRN, date of birth, ID band Patient awake    Reviewed: Allergy & Precautions, NPO status , Patient's Chart, lab work & pertinent test results  History of Anesthesia Complications Negative for: history of anesthetic complications  Airway Mallampati: III       Dental   Pulmonary COPD,  COPD inhaler, Current Smoker and Patient abstained from smoking.,           Cardiovascular hypertension, Pt. on medications (-) Past MI (-) Valvular Problems/Murmurs     Neuro/Psych neg Seizures Anxiety    GI/Hepatic GERD  Medicated and Controlled,  Endo/Other  diabetes, Type 2, Oral Hypoglycemic Agents  Renal/GU ARFRenal disease     Musculoskeletal   Abdominal   Peds  Hematology   Anesthesia Other Findings   Reproductive/Obstetrics                             Anesthesia Physical Anesthesia Plan  ASA: III and emergent  Anesthesia Plan: General   Post-op Pain Management:    Induction: Intravenous  PONV Risk Score and Plan: Propofol infusion and TIVA  Airway Management Planned: Nasal Cannula  Additional Equipment:   Intra-op Plan:   Post-operative Plan:   Informed Consent: I have reviewed the patients History and Physical, chart, labs and discussed the procedure including the risks, benefits and alternatives for the proposed anesthesia with the patient or authorized representative who has indicated his/her understanding and acceptance.       Plan Discussed with:   Anesthesia Plan Comments:         Anesthesia Quick Evaluation

## 2020-08-22 NOTE — H&P (Signed)
Kathryn Bellows, MD [address], [place], [place], [place], [address] [address], [place], [place], [place], [address] Phone: [phone]  Fax: [phone]  Primary Care Physician:  Venia Carbon, MD   Pre-Procedure History & Physical: HPI:  Kathryn Ware is a 79 y.o. female is here for an endoscopy    Past Medical History:  Diagnosis Date  . Anemia   . ARF (acute renal failure) ([place])   . Blood transfusion without reported diagnosis   . Diabetes mellitus type II   . GI (gastrointestinal bleed)   . GI AVM (gastrointestinal arteriovenous vascular malformation)    stomach  . History of echocardiogram 12/2013   a. 12/2013: EF 60-65%, DD, mild LVH, nl RV size & systolic function, mild MR/TR, mild AS, nl RVSP; b. TTE 04/2017: EF 60-65%, normal wall motion, GR1DD, mild MR, left atrium normal in size, normal RV systolic function, PASP normal   . History of nuclear stress test    a. 12/2013: low risk, no sig WMA, nondiag EKG 2/2 baseline LVH w/ repol abnl, no sig ischemia, EF 70% no artifact  . HLD (hyperlipidemia)   . HTN (hypertension)   . Hypercholesterolemia   . IDA (iron deficiency anemia)   . Polyp of colon, adenomatous   . RLS (restless legs syndrome)     Past Surgical History:  Procedure Laterality Date  . ABDOMINAL ADHESION SURGERY  11/2001   Dr. Tamala Julian  . ABDOMINAL HYSTERECTOMY  1976   RSO (fibroid)  . COLONOSCOPY WITH PROPOFOL N/A 07/07/2016   Procedure: COLONOSCOPY WITH PROPOFOL;  Surgeon: Manya Silvas, MD;  Location: [facility] ENDOSCOPY;  Service: Endoscopy;  Laterality: N/A;  . COLONOSCOPY WITH PROPOFOL N/A 12/14/2016   Procedure: COLONOSCOPY WITH PROPOFOL;  Surgeon: Manya Silvas, MD;  Location: [facility] ENDOSCOPY;  Service: Endoscopy;  Laterality: N/A;  . DOBUTAMINE STRESS ECHO  11/10   normal  . DOPPLER ECHOCARDIOGRAPHY     EF 55%, mild MR, TR  . ENTEROSCOPY N/A 06/08/2018   Procedure: ENTEROSCOPY;  Surgeon: Lin Landsman, MD;  [facility]  ENDOSCOPY;  Service: Gastroenterology;  Laterality: N/A;  . ENTEROSCOPY N/A 01/06/2020   Procedure: ENTEROSCOPY;  Surgeon: Lin Landsman, MD;  Location: [facility] ENDOSCOPY;  Service: Gastroenterology;  Laterality: N/A;  . ESOPHAGOGASTRODUODENOSCOPY  07/2006   multiple angioectasias  . ESOPHAGOGASTRODUODENOSCOPY Left 03/12/2015   Procedure: place tube in throat and evaluate stomach and duodenum for source of bleeding. Cauterize if concern for rebleeding.;  Surgeon: Hulen Luster, MD;  Location: [facility] ENDOSCOPY;  Service: Endoscopy;  Laterality: Left;  . ESOPHAGOGASTRODUODENOSCOPY N/A 12/14/2016   Procedure: ESOPHAGOGASTRODUODENOSCOPY (EGD);  Surgeon: Manya Silvas, MD;  Location: [facility] ENDOSCOPY;  Service: Endoscopy;  Laterality: N/A;  . ESOPHAGOGASTRODUODENOSCOPY N/A 07/19/2017   Procedure: ESOPHAGOGASTRODUODENOSCOPY (EGD);  Surgeon: Lin Landsman, MD;  Location: [facility] ENDOSCOPY;  Service: Gastroenterology;  Laterality: N/A;  . ESOPHAGOGASTRODUODENOSCOPY (EGD) WITH PROPOFOL N/A 11/23/2015   Procedure: ESOPHAGOGASTRODUODENOSCOPY (EGD) WITH PROPOFOL;  Surgeon: Hulen Luster, MD;  Location: [facility] ENDOSCOPY;  Service: Gastroenterology;  Laterality: N/A;  . ESOPHAGOGASTRODUODENOSCOPY (EGD) WITH PROPOFOL N/A 07/07/2016   Procedure: ESOPHAGOGASTRODUODENOSCOPY (EGD) WITH PROPOFOL;  Surgeon: Manya Silvas, MD;  Location: [facility] ENDOSCOPY;  Service: Endoscopy;  Laterality: N/A;  . ESOPHAGOGASTRODUODENOSCOPY (EGD) WITH PROPOFOL N/A 05/19/2017   Procedure: ESOPHAGOGASTRODUODENOSCOPY (EGD) WITH PROPOFOL;  Surgeon: Manya Silvas, MD;  Location: [facility] ENDOSCOPY;  Service: Endoscopy;  Laterality: N/A;  . ESOPHAGOGASTRODUODENOSCOPY (EGD) WITH PROPOFOL N/A 06/26/2020   Procedure: ESOPHAGOGASTRODUODENOSCOPY (  EGD) WITH PROPOFOL;  Surgeon: Lucilla Lame, MD;  Location: [facility] ENDOSCOPY;  Service: Endoscopy;  Laterality: N/A;  . laryngeal polyp  10/2008   Dr. Tami Ribas  . TOP      Prior to Admission medications   Medication  Sig Start Date End Date Taking? Authorizing Provider  clonazePAM (KLONOPIN) 0.5 MG tablet TAKE 1 TO 2 TABLETS(0.5 TO 1 MG) BY MOUTH AT BEDTIME AS NEEDED FOR RESTLESS LEGS, 08/11/20  Yes Venia Carbon, MD  lisinopril-hydrochlorothiazide (ZESTORETIC) 20-12.5 MG tablet Take 2 tablets by mouth daily. 11/04/19  Yes Venia Carbon, MD  metFORMIN (GLUCOPHAGE-XR) 500 MG 24 hr tablet TAKE 1 TABLET(500 MG) BY MOUTH DAILY WITH BREAKFAST Patient taking differently: Take 500 mg by mouth daily with breakfast.  02/05/20  Yes Venia Carbon, MD  omeprazole (PRILOSEC) 40 MG capsule Take 1 capsule (40 mg total) by mouth in the morning and at bedtime. 06/26/20  Yes Max Sane, MD  simvastatin (ZOCOR) 80 MG tablet TAKE 1 TABLET(80 MG) BY MOUTH DAILY 10/23/19  Yes Venia Carbon, MD  sucralfate (CARAFATE) 1 g tablet Take 1 tablet (1 g total) by mouth in the morning, at noon, and at bedtime. 06/30/20  Yes Venia Carbon, MD  vitamin B-12 (CYANOCOBALAMIN) 1000 MCG tablet Take 1,000 mcg by mouth daily.   Yes [provider]  Accu-Chek Softclix Lancets lancets  02/01/20   [provider]  acetaminophen (TYLENOL) 325 MG tablet Take 2 tablets (650 mg total) by mouth every 6 (six) hours as needed for mild pain (or Fever >/= 101). Patient not taking: Reported on 07/06/2020 06/09/18   Nicholes Mango, MD  albuterol (VENTOLIN HFA) 108 (90 Base) MCG/ACT inhaler INHALE 2 PUFFS INTO THE LUNGS EVERY 6 HOURS AS NEEDED FOR WHEEZING OR SHORTNESS OF BREATH Patient taking differently: Inhale 2 puffs into the lungs every 6 (six) hours as needed. INHALE 2 PUFFS INTO THE LUNGS EVERY 6 HOURS AS NEEDED FOR WHEEZING OR SHORTNESS OF BREATH 06/09/20   Viviana Simpler I, MD  Blood Glucose Monitoring Suppl (ACCU-CHEK GUIDE) w/Device KIT by Does not apply route.    [provider]  glucose blood (ACCU-CHEK GUIDE) test strip 1 each by Other route as needed for other. Use as instructed    [provider]     Allergies as of 08/22/2020 - Review Complete 08/22/2020  Allergen Reaction Noted  . Nifedipine  09/05/2006  . Metformin Other (See Comments) 09/05/2006    Family History  Problem Relation Age of Onset  . Stroke Father   . Cancer Sister        breast cancer  . Hypertension Mother   . Hypertension Other        sibling  . Diabetes Other        grandmother    Social History   Socioeconomic History  . Marital status: Widowed    Spouse name: Not on file  . Number of children: 1  . Years of education: Not on file  . Highest education level: Not on file  Occupational History  . Occupation: Regulatory affairs officer    Comment: retired  Tobacco Use  . Smoking status: Current Every Day Smoker    Packs/day: 0.75    Years: 63.00    Pack years: 47.25    Types: Cigarettes  . Smokeless tobacco: Never Used  Vaping Use  . Vaping Use: Never used  Substance and Sexual Activity  . Alcohol use: No  . Drug use: No  . Sexual activity:  Not on file  Other Topics Concern  . Not on file  Social History Narrative   Lives with sister; in [place] since 56 years; 1/2 ppd; no alcohol. Used to work in [place].       No living will   Requests son Orson Gear as health care POA   Would accept resuscitation --but no prolonged machines   Not sure about feeding tubes   Social Determinants of Health   Financial Resource Strain:   . Difficulty of Paying Living Expenses: Not on file  Food Insecurity:   . Worried About Charity fundraiser in the Last Year: Not on file  . Ran Out of Food in the Last Year: Not on file  Transportation Needs:   . Lack of Transportation (Medical): Not on file  . Lack of Transportation (Non-Medical): Not on file  Physical Activity:   . Days of Exercise per Week: Not on file  . Minutes of Exercise per Session: Not on file  Stress:   . Feeling of Stress : Not on file  Social Connections:   . Frequency of Communication with Friends and Family: Not on file  .  Frequency of Social Gatherings with Friends and Family: Not on file  . Attends Religious Services: Not on file  . Active Member of Clubs or Organizations: Not on file  . Attends Archivist Meetings: Not on file  . Marital Status: Not on file  Intimate Partner Violence:   . Fear of Current or Ex-Partner: Not on file  . Emotionally Abused: Not on file  . Physically Abused: Not on file  . Sexually Abused: Not on file    Review of Systems: See HPI, otherwise negative ROS  Physical Exam: BP (!) 132/49 (BP Location: Left Arm)   Pulse 99   Temp 98.5 F (36.9 C) (Oral)   Resp 20   Ht '5\' 4"'  (1.626 m)   Wt 46.7 kg   LMP  (LMP Unknown)   SpO2 100%   BMI 17.68 kg/m  General:   Alert,  pleasant and cooperative in NAD Head:  Normocephalic and atraumatic. Neck:  Supple; no masses or thyromegaly. Lungs:  Clear throughout to auscultation, normal respiratory effort.    Heart:  +S1, +S2, Regular rate and rhythm, No edema. Abdomen:  Soft, nontender and nondistended. Normal bowel sounds, without guarding, and without rebound.   Neurologic:  Alert and  oriented x4;  grossly normal neurologically.  Impression/Plan: Kathryn Ware is here for an endoscopy  to be performed for  evaluation of anemia    Risks, benefits, limitations, and alternatives regarding endoscopy have been reviewed with the patient.  Questions have been answered.  All parties agreeable.   Kathryn Bellows, MD  08/22/2020, 5:59 PM

## 2020-08-22 NOTE — Anesthesia Postprocedure Evaluation (Signed)
Anesthesia Post Note  Patient: Kathryn Ware  Procedure(s) Performed: ENTEROSCOPY (N/A )  Patient location during evaluation: PACU Anesthesia Type: General Level of consciousness: awake and alert Pain management: pain level controlled Vital Signs Assessment: post-procedure vital signs reviewed and stable Respiratory status: spontaneous breathing and respiratory function stable Cardiovascular status: stable Anesthetic complications: no   No complications documented.   Last Vitals:  Vitals:   08/22/20 1856 08/22/20 1940  BP: (!) 126/41 (!) 128/44  Pulse: (!) 101 67  Resp: (!) 21 20  Temp: 37 C 36.6 C  SpO2: 100% 100%    Last Pain:  Vitals:   08/22/20 1940  TempSrc: Oral  PainSc:                  Davian Wollenberg K

## 2020-08-22 NOTE — Consult Note (Signed)
Kathryn Ware , MD [address], [place], [place], [place], [address] [id number], [place], [place], [place], [address] Phone: [phone]  Fax: [phone]  Consultation  Referring Provider:   Dr Damita Dunnings Primary Care Physician:  Venia Carbon, MD Primary Gastroenterologist:  Dr Marius Ditch         Reason for Consultation:    Anemia  Date of Admission:  08/22/2020 Date of Consultation:  08/22/2020         HPI:   Kathryn Ware is a 79 y.o. female has been followed by Dr. Marius Ditch as an outpatient.  She has a known history of small bowel AVMs on oral iron.  Her last upper endoscopy was in 07/13/2020 with multiple AVMs were found in the duodenal polyp and were treated with APC.  A push enteroscopy of small bowel performed in March 2021 showed multiple AVMs in the proximal jejunum which was treated with APC.  She has undergone multiple other endoscopy procedures for the past 2 to 3 years.  Last iron level was 13 about a month back.  Her  other comorbidities include diabetes hypertension.  She returned to the ER with a 1 week history of weakness and shortness of breath.  No overt blood loss.Denies any abdominal pain .Stool has been dark, she has taken iron, denies any nsaid use   On admission her BUN was 36 and creatinine 1.26.  Hemoglobin 6.5 g down from 11 g about a month back.  Elevated venous lactate at 2.0.  This morning repeat hemoglobin is 10.9 g.  Past Medical History:  Diagnosis Date  . Anemia   . ARF (acute renal failure) ([place])   . Blood transfusion without reported diagnosis   . Diabetes mellitus type II   . GI (gastrointestinal bleed)   . GI AVM (gastrointestinal arteriovenous vascular malformation)    stomach  . History of echocardiogram 12/2013   a. 12/2013: EF 60-65%, DD, mild LVH, nl RV size & systolic function, mild MR/TR, mild AS, nl RVSP; b. TTE 04/2017: EF 60-65%, normal wall motion, GR1DD, mild MR, left atrium normal in size, normal RV systolic function, PASP normal    . History of nuclear stress test    a. 12/2013: low risk, no sig WMA, nondiag EKG 2/2 baseline LVH w/ repol abnl, no sig ischemia, EF 70% no artifact  . HLD (hyperlipidemia)   . HTN (hypertension)   . Hypercholesterolemia   . IDA (iron deficiency anemia)   . Polyp of colon, adenomatous   . RLS (restless legs syndrome)     Past Surgical History:  Procedure Laterality Date  . ABDOMINAL ADHESION SURGERY  11/2001   Dr. Tamala Julian  . ABDOMINAL HYSTERECTOMY  1976   RSO (fibroid)  . COLONOSCOPY WITH PROPOFOL N/A 07/07/2016   Procedure: COLONOSCOPY WITH PROPOFOL;  Surgeon: Manya Silvas, MD;  Location: [facility] ENDOSCOPY;  Service: Endoscopy;  Laterality: N/A;  . COLONOSCOPY WITH PROPOFOL N/A 12/14/2016   Procedure: COLONOSCOPY WITH PROPOFOL;  Surgeon: Manya Silvas, MD;  Location: [facility] ENDOSCOPY;  Service: Endoscopy;  Laterality: N/A;  . DOBUTAMINE STRESS ECHO  11/10   normal  . DOPPLER ECHOCARDIOGRAPHY     EF 55%, mild MR, TR  . ENTEROSCOPY N/A 06/08/2018   Procedure: ENTEROSCOPY;  Surgeon: Lin Landsman, MD;  Location: [facility] ENDOSCOPY;  Service: Gastroenterology;  Laterality: N/A;  . ENTEROSCOPY N/A 01/06/2020   Procedure: ENTEROSCOPY;  Surgeon: Lin Landsman, MD;  Location: [facility] ENDOSCOPY;  Service: Gastroenterology;  Laterality: N/A;  .  ESOPHAGOGASTRODUODENOSCOPY  07/2006   multiple angioectasias  . ESOPHAGOGASTRODUODENOSCOPY Left 03/12/2015   Procedure: place tube in throat and evaluate stomach and duodenum for source of bleeding. Cauterize if concern for rebleeding.;  Surgeon: Hulen Luster, MD;  Location: [facility] ENDOSCOPY;  Service: Endoscopy;  Laterality: Left;  . ESOPHAGOGASTRODUODENOSCOPY N/A 12/14/2016   Procedure: ESOPHAGOGASTRODUODENOSCOPY (EGD);  Surgeon: Manya Silvas, MD;  Location: [facility] ENDOSCOPY;  Service: Endoscopy;  Laterality: N/A;  . ESOPHAGOGASTRODUODENOSCOPY N/A 07/19/2017   Procedure: ESOPHAGOGASTRODUODENOSCOPY (EGD);  Surgeon: Lin Landsman, MD;  Location:  [facility] ENDOSCOPY;  Service: Gastroenterology;  Laterality: N/A;  . ESOPHAGOGASTRODUODENOSCOPY (EGD) WITH PROPOFOL N/A 11/23/2015   Procedure: ESOPHAGOGASTRODUODENOSCOPY (EGD) WITH PROPOFOL;  Surgeon: Hulen Luster, MD;  Location: [facility] ENDOSCOPY;  Service: Gastroenterology;  Laterality: N/A;  . ESOPHAGOGASTRODUODENOSCOPY (EGD) WITH PROPOFOL N/A 07/07/2016   Procedure: ESOPHAGOGASTRODUODENOSCOPY (EGD) WITH PROPOFOL;  Surgeon: Manya Silvas, MD;  Location: [facility] ENDOSCOPY;  Service: Endoscopy;  Laterality: N/A;  . ESOPHAGOGASTRODUODENOSCOPY (EGD) WITH PROPOFOL N/A 05/19/2017   Procedure: ESOPHAGOGASTRODUODENOSCOPY (EGD) WITH PROPOFOL;  Surgeon: Manya Silvas, MD;  Location: [facility] ENDOSCOPY;  Service: Endoscopy;  Laterality: N/A;  . ESOPHAGOGASTRODUODENOSCOPY (EGD) WITH PROPOFOL N/A 06/26/2020   Procedure: ESOPHAGOGASTRODUODENOSCOPY (EGD) WITH PROPOFOL;  Surgeon: Lucilla Lame, MD;  Location: [facility] ENDOSCOPY;  Service: Endoscopy;  Laterality: N/A;  . laryngeal polyp  10/2008   Dr. Tami Ribas  . TOP      Prior to Admission medications   Medication Sig Start Date End Date Taking? Authorizing Provider  Accu-Chek Softclix Lancets lancets  02/01/20   [provider]  acetaminophen (TYLENOL) 325 MG tablet Take 2 tablets (650 mg total) by mouth every 6 (six) hours as needed for mild pain (or Fever >/= 101). Patient not taking: Reported on 07/06/2020 06/09/18   Nicholes Mango, MD  albuterol (VENTOLIN HFA) 108 (90 Base) MCG/ACT inhaler INHALE 2 PUFFS INTO THE LUNGS EVERY 6 HOURS AS NEEDED FOR WHEEZING OR SHORTNESS OF BREATH Patient taking differently: Inhale 2 puffs into the lungs every 6 (six) hours as needed. INHALE 2 PUFFS INTO THE LUNGS EVERY 6 HOURS AS NEEDED FOR WHEEZING OR SHORTNESS OF BREATH 06/09/20   Viviana Simpler I, MD  Blood Glucose Monitoring Suppl (ACCU-CHEK GUIDE) w/Device KIT by Does not apply route.    [provider]  clonazePAM (KLONOPIN) 0.5 MG tablet TAKE 1 TO 2 TABLETS(0.5 TO 1 MG)  BY MOUTH AT BEDTIME AS NEEDED FOR RESTLESS LEGS, 08/11/20   Viviana Simpler I, MD  glucose blood (ACCU-CHEK GUIDE) test strip 1 each by Other route as needed for other. Use as instructed    [provider]  lisinopril-hydrochlorothiazide (ZESTORETIC) 20-12.5 MG tablet Take 2 tablets by mouth daily. 11/04/19   Venia Carbon, MD  metFORMIN (GLUCOPHAGE-XR) 500 MG 24 hr tablet TAKE 1 TABLET(500 MG) BY MOUTH DAILY WITH BREAKFAST Patient taking differently: Take 500 mg by mouth daily with breakfast.  02/05/20   Venia Carbon, MD  omeprazole (PRILOSEC) 40 MG capsule Take 1 capsule (40 mg total) by mouth in the morning and at bedtime. 06/26/20   Max Sane, MD  simvastatin (ZOCOR) 80 MG tablet TAKE 1 TABLET(80 MG) BY MOUTH DAILY 10/23/19   Venia Carbon, MD  sucralfate (CARAFATE) 1 g tablet Take 1 tablet (1 g total) by mouth in the morning, at noon, and at bedtime. 06/30/20   Venia Carbon, MD  vitamin B-12 (CYANOCOBALAMIN) 1000 MCG tablet Take 1,000 mcg by mouth daily.    [provider]  Family History  Problem Relation Age of Onset  . Stroke Father   . Cancer Sister        breast cancer  . Hypertension Mother   . Hypertension Other        sibling  . Diabetes Other        grandmother     Social History   Tobacco Use  . Smoking status: Current Every Day Smoker    Packs/day: 0.75    Years: 63.00    Pack years: 47.25    Types: Cigarettes  . Smokeless tobacco: Never Used  Vaping Use  . Vaping Use: Never used  Substance Use Topics  . Alcohol use: No  . Drug use: No    Allergies as of 08/22/2020 - Review Complete 08/22/2020  Allergen Reaction Noted  . Nifedipine  09/05/2006  . Metformin Other (See Comments) 09/05/2006    Review of Systems:    All systems reviewed and negative except where noted in HPI.   Physical Exam:  Vital signs in last 24 hours: Temp:  [97.7 F (36.5 C)-97.8 F (36.6 C)] 97.8 F (36.6 C) (10/30 0817) Pulse Rate:  [76-103]  80 (10/30 0817) Resp:  [18-25] 22 (10/30 0817) BP: (101-146)/(40-61) 105/61 (10/30 0817) SpO2:  [99 %-100 %] 99 % (10/30 0817) Weight:  [46.7 kg] 46.7 kg (10/30 0309)   General:   Pleasant, cooperative in NAD Head:  Normocephalic and atraumatic. Eyes:   No icterus.   Conjunctiva pink. PERRLA. Ears:  Normal auditory acuity. Neck:  Supple; no masses or thyroidomegaly Lungs: Respirations even and unlabored. Lungs clear to auscultation bilaterally.   No wheezes, crackles, or rhonchi.  Heart:  Regular rate and rhythm;  Without murmur, clicks, rubs or gallops Abdomen:  Soft, nondistended, nontender. Normal bowel sounds. No appreciable masses or hepatomegaly.  No rebound or guarding.  Neurologic:  Alert and oriented x3;  grossly normal neurologically. Skin:  Intact without significant lesions or rashes. Cervical Nodes:  No significant cervical adenopathy. Psych:  Alert and cooperative. Normal affect.  LAB RESULTS: Recent Labs    08/22/20 0306 08/22/20 0820  WBC 5.0  --   HGB 6.5* 10.9*  HCT 21.1*  --   PLT 250  --    BMET Recent Labs    08/22/20 0306  NA 138  K 4.8  CL 108  CO2 21*  GLUCOSE 199*  BUN 36*  CREATININE 1.26*  CALCIUM 9.3   LFT Recent Labs    08/22/20 0306  PROT 6.9  ALBUMIN 3.9  AST 24  ALT 8  ALKPHOS 40  BILITOT 0.6   PT/INR No results for input(s): LABPROT, INR in the last 72 hours.  STUDIES: No results found.    Impression / Plan:   Kathryn Ware is a 79 y.o. y/o female with a past medical history of duodenal and proximal jejunal AVMs who has undergone endoscopy and APC of these lesions on multiple occasions.  Last was in 07/13/2020.  Return to the emergency room with no overt blood loss but a significant drop in hemoglobin to 6.5 g from a baseline 11 g.  She has chronic iron deficiency.  Has been on oral iron.  Her last ferritin was less than 15 a month back which is suggestive of severe iron deficiency.  Very likely she has had further bleeding  from AVMs.  I note she has never had a capsule study of small bowel.  Plan 1.  Monitor CBC and transfuse 2.  Suggest a  dose of IV iron. 3.  As an outpatient she will require continuous monitoring of her iron levels and IV iron. 4.  EGD today, out patient capsule study to be considered and colonoscopy if not had recently  5.  IV PPI   I have discussed alternative options, risks & benefits,  which include, but are not limited to, bleeding, infection, perforation,respiratory complication & drug reaction.  The patient agrees with this plan & written consent will be obtained.     Thank you for involving me in the care of this patient.      LOS: 0 days   Kathryn Bellows, MD  08/22/2020, 9:24 AM

## 2020-08-22 NOTE — ED Triage Notes (Signed)
Pt arrives via [facility] from home with complaint of weakness for approx 1 week and headache starting this AM. Pt with negative stroke screen per EMS. Pt VS stable, A&O x4 on arrival, NAD

## 2020-08-22 NOTE — H&P (Signed)
History and Physical    [name] KPT:[id number] DOB: [birth year] DOA: 08/22/2020  PCP: Venia Carbon, MD   Patient coming from: home  I have personally briefly reviewed patient's old medical records in [place]  Chief Complaint: Weakness, shortness of breath  HPI: Kathryn Ware is a 79 y.o. female with medical history significant for diabetes, hypertension, gastric AVMs with history of GI bleed most recently in September 2021 when she required 3 units PRBCs for bleeding from gastric AVMs, treated with argon plasma coagulation(APC) who returns to the emergency room with a 1 week history of weakness and shortness of breath with exertion.  She denies seeing blood in the stool, or having black or bloody stool.  Denies nausea or vomiting and denies abdominal pain.  Denies fever or chills.  Denies chest pain or palpitations.  Feels overall weak but denies lightheadedness. ED Course: On arrival, she was afebrile, BP 137/49, tachycardic at 103.  Blood work with hemoglobin of 6.5, down from 11 at discharge 1 month prior.  Creatinine 1.26, up from 0.89 one-month prior.  Hemoccult positive.  Patient was given an IV fluid bolus, Protonix bolus and started on Protonix infusion.  Typed and crossed and started on 1 unit PRBC.  Hospitalist consulted for admission. EKG as reviewed by me : Sinus rhythm at 91 with no acute ST-T wave changes  Review of Systems: As per HPI otherwise all other systems on review of systems negative.    Past Medical History:  Diagnosis Date  . Anemia   . ARF (acute renal failure) ([place])   . Blood transfusion without reported diagnosis   . Diabetes mellitus type II   . GI (gastrointestinal bleed)   . GI AVM (gastrointestinal arteriovenous vascular malformation)    stomach  . History of echocardiogram 12/2013   a. 12/2013: EF 60-65%, DD, mild LVH, nl RV size & systolic function, mild MR/TR, mild AS, nl RVSP; b. TTE 04/2017: EF 60-65%, normal wall motion, GR1DD, mild  MR, left atrium normal in size, normal RV systolic function, PASP normal   . History of nuclear stress test    a. 12/2013: low risk, no sig WMA, nondiag EKG 2/2 baseline LVH w/ repol abnl, no sig ischemia, EF 70% no artifact  . HLD (hyperlipidemia)   . HTN (hypertension)   . Hypercholesterolemia   . IDA (iron deficiency anemia)   . Polyp of colon, adenomatous   . RLS (restless legs syndrome)     Past Surgical History:  Procedure Laterality Date  . ABDOMINAL ADHESION SURGERY  11/2001   Dr. Tamala Julian  . ABDOMINAL HYSTERECTOMY  1976   RSO (fibroid)  . COLONOSCOPY WITH PROPOFOL N/A 07/07/2016   Procedure: COLONOSCOPY WITH PROPOFOL;  Surgeon: Manya Silvas, MD;  Location: [facility] ENDOSCOPY;  Service: Endoscopy;  Laterality: N/A;  . COLONOSCOPY WITH PROPOFOL N/A 12/14/2016   Procedure: COLONOSCOPY WITH PROPOFOL;  Surgeon: Manya Silvas, MD;  Location: [facility] ENDOSCOPY;  Service: Endoscopy;  Laterality: N/A;  . DOBUTAMINE STRESS ECHO  11/10   normal  . DOPPLER ECHOCARDIOGRAPHY     EF 55%, mild MR, TR  . ENTEROSCOPY N/A 06/08/2018   Procedure: ENTEROSCOPY;  Surgeon: Lin Landsman, MD;  Location: [facility] ENDOSCOPY;  Service: Gastroenterology;  Laterality: N/A;  . ENTEROSCOPY N/A 01/06/2020   Procedure: ENTEROSCOPY;  Surgeon: Lin Landsman, MD;  Location: [facility] ENDOSCOPY;  Service: Gastroenterology;  Laterality: N/A;  . ESOPHAGOGASTRODUODENOSCOPY  07/2006   multiple angioectasias  . ESOPHAGOGASTRODUODENOSCOPY Left 03/12/2015  Procedure: place tube in throat and evaluate stomach and duodenum for source of bleeding. Cauterize if concern for rebleeding.;  Surgeon: Hulen Luster, MD;  Location: [facility] ENDOSCOPY;  Service: Endoscopy;  Laterality: Left;  . ESOPHAGOGASTRODUODENOSCOPY N/A 12/14/2016   Procedure: ESOPHAGOGASTRODUODENOSCOPY (EGD);  Surgeon: Manya Silvas, MD;  Location: [facility] ENDOSCOPY;  Service: Endoscopy;  Laterality: N/A;  . ESOPHAGOGASTRODUODENOSCOPY N/A 07/19/2017   Procedure:  ESOPHAGOGASTRODUODENOSCOPY (EGD);  Surgeon: Lin Landsman, MD;  Location: [facility] ENDOSCOPY;  Service: Gastroenterology;  Laterality: N/A;  . ESOPHAGOGASTRODUODENOSCOPY (EGD) WITH PROPOFOL N/A 11/23/2015   Procedure: ESOPHAGOGASTRODUODENOSCOPY (EGD) WITH PROPOFOL;  Surgeon: Hulen Luster, MD;  Location: [facility] ENDOSCOPY;  Service: Gastroenterology;  Laterality: N/A;  . ESOPHAGOGASTRODUODENOSCOPY (EGD) WITH PROPOFOL N/A 07/07/2016   Procedure: ESOPHAGOGASTRODUODENOSCOPY (EGD) WITH PROPOFOL;  Surgeon: Manya Silvas, MD;  Location: [facility] ENDOSCOPY;  Service: Endoscopy;  Laterality: N/A;  . ESOPHAGOGASTRODUODENOSCOPY (EGD) WITH PROPOFOL N/A 05/19/2017   Procedure: ESOPHAGOGASTRODUODENOSCOPY (EGD) WITH PROPOFOL;  Surgeon: Manya Silvas, MD;  Location: [facility] ENDOSCOPY;  Service: Endoscopy;  Laterality: N/A;  . ESOPHAGOGASTRODUODENOSCOPY (EGD) WITH PROPOFOL N/A 06/26/2020   Procedure: ESOPHAGOGASTRODUODENOSCOPY (EGD) WITH PROPOFOL;  Surgeon: Lucilla Lame, MD;  Location: [facility] ENDOSCOPY;  Service: Endoscopy;  Laterality: N/A;  . laryngeal polyp  10/2008   Dr. Tami Ribas  . TOP       reports that she has been smoking cigarettes. She has a 47.25 pack-year smoking history. She has never used smokeless tobacco. She reports that she does not drink alcohol and does not use drugs.  Allergies  Allergen Reactions  . Nifedipine     REACTION: cough  . Metformin Other (See Comments)    REACTION: diarrhea when dosage is increased, ok when taking one tablet daily, patient aware that she is on metformin when it is listed that she is allergic    Family History  Problem Relation Age of Onset  . Stroke Father   . Cancer Sister        breast cancer  . Hypertension Mother   . Hypertension Other        sibling  . Diabetes Other        grandmother      Prior to Admission medications   Medication Sig Start Date End Date Taking? Authorizing Provider  Accu-Chek Softclix Lancets lancets  02/01/20   [provider]  acetaminophen (TYLENOL) 325 MG tablet Take 2 tablets (650 mg total) by mouth every 6 (six) hours as needed for mild pain (or Fever >/= 101). Patient not taking: Reported on 07/06/2020 06/09/18   Nicholes Mango, MD  albuterol (VENTOLIN HFA) 108 (90 Base) MCG/ACT inhaler INHALE 2 PUFFS INTO THE LUNGS EVERY 6 HOURS AS NEEDED FOR WHEEZING OR SHORTNESS OF BREATH Patient taking differently: Inhale 2 puffs into the lungs every 6 (six) hours as needed. INHALE 2 PUFFS INTO THE LUNGS EVERY 6 HOURS AS NEEDED FOR WHEEZING OR SHORTNESS OF BREATH 06/09/20   Viviana Simpler I, MD  Blood Glucose Monitoring Suppl (ACCU-CHEK GUIDE) w/Device KIT by Does not apply route.    [provider]  clonazePAM (KLONOPIN) 0.5 MG tablet TAKE 1 TO 2 TABLETS(0.5 TO 1 MG) BY MOUTH AT BEDTIME AS NEEDED FOR RESTLESS LEGS, 08/11/20   Viviana Simpler I, MD  glucose blood (ACCU-CHEK GUIDE) test strip 1 each by Other route as needed for other. Use as instructed    [provider]  lisinopril-hydrochlorothiazide (ZESTORETIC) 20-12.5 MG tablet Take 2 tablets by mouth daily. 11/04/19   Viviana Simpler  I, MD  metFORMIN (GLUCOPHAGE-XR) 500 MG 24 hr tablet TAKE 1 TABLET(500 MG) BY MOUTH DAILY WITH BREAKFAST Patient taking differently: Take 500 mg by mouth daily with breakfast.  02/05/20   Venia Carbon, MD  omeprazole (PRILOSEC) 40 MG capsule Take 1 capsule (40 mg total) by mouth in the morning and at bedtime. 06/26/20   Max Sane, MD  simvastatin (ZOCOR) 80 MG tablet TAKE 1 TABLET(80 MG) BY MOUTH DAILY 10/23/19   Venia Carbon, MD  sucralfate (CARAFATE) 1 g tablet Take 1 tablet (1 g total) by mouth in the morning, at noon, and at bedtime. 06/30/20   Venia Carbon, MD  vitamin B-12 (CYANOCOBALAMIN) 1000 MCG tablet Take 1,000 mcg by mouth daily.    [provider]    Physical Exam: Vitals:   08/22/20 0309 08/22/20 0312 08/22/20 0330 08/22/20 0345  BP:   (!) 124/49 (!) 134/47  Pulse:  99 92 92  Resp:   (!)  22 (!) 21  Temp:      TempSrc:      SpO2:   100% 100%  Weight: 46.7 kg     Height: '5\' 4"'  (1.626 m)        Vitals:   08/22/20 0309 08/22/20 0312 08/22/20 0330 08/22/20 0345  BP:   (!) 124/49 (!) 134/47  Pulse:  99 92 92  Resp:   (!) 22 (!) 21  Temp:      TempSrc:      SpO2:   100% 100%  Weight: 46.7 kg     Height: '5\' 4"'  (1.626 m)         Constitutional: Alert and oriented x 3 . Not in any apparent distress HEENT:      Head: Normocephalic and atraumatic.         Eyes: PERLA, EOMI, Conjunctivae are normal. Sclera is non-icteric.       Mouth/Throat: Mucous membranes are moist.       Neck: Supple with no signs of meningismus. Cardiovascular: Regular rate and rhythm. No murmurs, gallops, or rubs. 2+ symmetrical distal pulses are present . No JVD. No LE edema Respiratory: Respiratory effort normal .Lungs sounds clear bilaterally. No wheezes, crackles, or rhonchi.  Gastrointestinal: Soft, non tender, and non distended with positive bowel sounds. No rebound or guarding. Genitourinary: No CVA tenderness. Musculoskeletal: Nontender with normal range of motion in all extremities. No cyanosis, or erythema of extremities. Neurologic:  Face is symmetric. Moving all extremities. No gross focal neurologic deficits . Skin: Skin is warm, dry.  No rash or ulcers Psychiatric: Mood and affect are normal    Labs on Admission: I have personally reviewed following labs and imaging studies  CBC: Recent Labs  Lab 08/22/20 0306  WBC 5.0  NEUTROABS 3.4  HGB 6.5*  HCT 21.1*  MCV 83.4  PLT 601   Basic Metabolic Panel: Recent Labs  Lab 08/22/20 0306  NA 138  K 4.8  CL 108  CO2 21*  GLUCOSE 199*  BUN 36*  CREATININE 1.26*  CALCIUM 9.3   GFR: Estimated Creatinine Clearance: 27.1 mL/min (A) (by C-G formula based on SCr of 1.26 mg/dL (H)). Liver Function Tests: Recent Labs  Lab 08/22/20 0306  AST 24  ALT 8  ALKPHOS 40  BILITOT 0.6  PROT 6.9  ALBUMIN 3.9   Recent Labs  Lab  08/22/20 0306  LIPASE 34   No results for input(s): AMMONIA in the last 168 hours. Coagulation Profile: No results for input(s): INR, PROTIME in  the last 168 hours. Cardiac Enzymes: No results for input(s): CKTOTAL, CKMB, CKMBINDEX, TROPONINI in the last 168 hours. BNP (last 3 results) No results for input(s): PROBNP in the last 8760 hours. HbA1C: No results for input(s): HGBA1C in the last 72 hours. CBG: No results for input(s): GLUCAP in the last 168 hours. Lipid Profile: No results for input(s): CHOL, HDL, LDLCALC, TRIG, CHOLHDL, LDLDIRECT in the last 72 hours. Thyroid Function Tests: No results for input(s): TSH, T4TOTAL, FREET4, T3FREE, THYROIDAB in the last 72 hours. Anemia Panel: No results for input(s): VITAMINB12, FOLATE, FERRITIN, TIBC, IRON, RETICCTPCT in the last 72 hours. Urine analysis:    Component Value Date/Time   COLORURINE YELLOW (A) 06/25/2020 0539   APPEARANCEUR CLEAR (A) 06/25/2020 0539   LABSPEC 1.017 06/25/2020 0539   PHURINE 5.0 06/25/2020 [place] 06/25/2020 0539   HGBUR NEGATIVE 06/25/2020 0539   BILIRUBINUR NEGATIVE 06/25/2020 0539   KETONESUR NEGATIVE 06/25/2020 0539   PROTEINUR NEGATIVE 06/25/2020 0539   NITRITE NEGATIVE 06/25/2020 0539   LEUKOCYTESUR NEGATIVE 06/25/2020 0539    Radiological Exams on Admission: No results found.   Assessment/Plan 79 year old female with history of diabetes, hypertension,with history of GI bleed secondary to gastric AVMs, most recently in September 2021 when she required 3 units PRBCs for bleeding from gastric AVMs, treated with argon plasma coagulation(APC) presenting with 1 week history of weakness and shortness of breath with exertion.  Hemoglobin 6.5.     Acute blood loss anemia   Angiodysplasia of stomach and duodenum   Symptomatic anemia -EGD 06/2020 showed gastric AVMs,treated with argon plasma coagulation(APC)  -Hemoglobin 6.5 down from 11 a month ago -Transfuse 2 units packed red  blood cells and monitor H&H -Continue Protonix infusion -Keep n.p.o. -GI consult  AKI (acute kidney injury) ([place]) -Creatinine 1.26, up from 0.89 -IV hydration and monitor renal function    Type 2 diabetes mellitus without complication (HCC) -Sliding scale insulin coverage    Benign essential hypertension -Patient normotensive.  We will hold home antihypertensives for now   DVT prophylaxis: SCDs Code Status: full code  Family Communication:  none  Disposition Plan: Back to previous home environment Consults called: GI Status:. At the time of admission, it appears that the appropriate admission status for this patient is INPATIENT. This is judged to be reasonable and necessary in order to provide the required intensity of service to ensure the patient's safety given the presenting symptoms, physical exam findings, and initial radiographic and laboratory data in the context of their  Comorbid conditions.   Patient requires inpatient status due to high intensity of service, high risk for further deterioration and high frequency of surveillance required.   I certify that at the point of admission it is my clinical judgment that the patient will require inpatient hospital care spanning beyond [place] MD [facility]     08/22/2020, 5:02 AM

## 2020-08-22 NOTE — ED Provider Notes (Signed)
[facility] Emergency Department Provider Note  ____________________________________________   First MD Initiated Contact with Patient 08/22/20 [phone]     (approximate)  I have reviewed the triage vital signs and the nursing notes.   HISTORY  Chief Complaint Weakness and Headache    HPI Kathryn Ware is a 79 y.o. female with medical history as listed below which notably includes a prior history of GI bleeding for which she was admitted to the hospital and received an EGD within the last month or so.  She presents tonight by EMS for evaluation of gradually worsening generalized weakness over the last week.  She has also developed some mild dyspnea on exertion.  She states she feels tired all the time.  She also believes that she has had some dark and tarry stools.  She said that the generalized weakness and fatigue are severe nothing particular makes her feel better and exertion makes her feel worse.  She has not passed out but says she almost passed out earlier today when she got up to move around and thought she was going to lose consciousness.  She denies fever/chills, sore throat, chest pain, shortness of breath when she is not exerting herself, and abdominal pain.  She has had no dysuria.         Past Medical History:  Diagnosis Date  . Anemia   . ARF (acute renal failure) ([place])   . Blood transfusion without reported diagnosis   . Diabetes mellitus type II   . GI (gastrointestinal bleed)   . GI AVM (gastrointestinal arteriovenous vascular malformation)    stomach  . History of echocardiogram 12/2013   a. 12/2013: EF 60-65%, DD, mild LVH, nl RV size & systolic function, mild MR/TR, mild AS, nl RVSP; b. TTE 04/2017: EF 60-65%, normal wall motion, GR1DD, mild MR, left atrium normal in size, normal RV systolic function, PASP normal   . History of nuclear stress test    a. 12/2013: low risk, no sig WMA, nondiag EKG 2/2 baseline LVH w/ repol abnl, no sig  ischemia, EF 70% no artifact  . HLD (hyperlipidemia)   . HTN (hypertension)   . Hypercholesterolemia   . IDA (iron deficiency anemia)   . Polyp of colon, adenomatous   . RLS (restless legs syndrome)     Patient Active Problem List   Diagnosis Date Noted  . Acute blood loss anemia 08/22/2020  . GI bleed 08/22/2020  . Angiodysplasia of stomach and duodenum with hemorrhage   . Gastritis with hemorrhage   . Weakness   . GI bleeding 06/24/2020  . Symptomatic anemia 01/05/2020  . HLD (hyperlipidemia) 01/05/2020  . GERD (gastroesophageal reflux disease) 01/05/2020  . Anxiety 01/05/2020  . Chronic renal disease, stage III ([place]) 01/05/2020  . Tobacco abuse 01/05/2020  . Syncope 01/05/2020  . GIB (gastrointestinal bleeding) 01/05/2020  . Peripheral arterial disease ([place]) 09/12/2019  . Chronic bronchitis ([place]) 12/20/2018  . Aortic atherosclerosis ([place]) 12/19/2017  . Chronic diastolic CHF (congestive heart failure) ([place]) 05/31/2017  . Malnutrition of mild degree ([place]) 03/30/2017  . History of adenomatous polyp of colon 09/19/2016  . Anemia due to chronic blood loss 07/05/2016  . Restless legs syndrome (RLS) 02/23/2016  . 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 AKI (acute kidney injury) ([place]) 03/19/2015  . Congenital gastrointestinal vessel anomaly 03/19/2015  . Anemia due to gastrointestinal blood loss 02/18/2015  . Advance directive discussed with patient 02/18/2015  . Nicotine dependence 10/29/2013  . Routine general medical  examination at a health care facility 08/17/2011  . Mild cognitive impairment 08/17/2011  . Type 2 diabetes mellitus without complication ([place]) 26/20/3559  . HYPERCHOLESTEROLEMIA 02/02/2007  . HTN (hypertension) 02/02/2007  . Angiodysplasia of intestinal tract 02/02/2007  . Benign essential hypertension 02/02/2007    Past Surgical History:  Procedure Laterality Date  . ABDOMINAL ADHESION SURGERY  11/2001   Dr. Tamala Julian  . ABDOMINAL HYSTERECTOMY   1976   RSO (fibroid)  . COLONOSCOPY WITH PROPOFOL N/A 07/07/2016   Procedure: COLONOSCOPY WITH PROPOFOL;  Surgeon: Manya Silvas, MD;  Location: [facility] ENDOSCOPY;  Service: Endoscopy;  Laterality: N/A;  . COLONOSCOPY WITH PROPOFOL N/A 12/14/2016   Procedure: COLONOSCOPY WITH PROPOFOL;  Surgeon: Manya Silvas, MD;  Location: [facility] ENDOSCOPY;  Service: Endoscopy;  Laterality: N/A;  . DOBUTAMINE STRESS ECHO  11/10   normal  . DOPPLER ECHOCARDIOGRAPHY     EF 55%, mild MR, TR  . ENTEROSCOPY N/A 06/08/2018   Procedure: ENTEROSCOPY;  Surgeon: Lin Landsman, MD;  Location: [facility] ENDOSCOPY;  Service: Gastroenterology;  Laterality: N/A;  . ENTEROSCOPY N/A 01/06/2020   Procedure: ENTEROSCOPY;  Surgeon: Lin Landsman, MD;  Location: [facility] ENDOSCOPY;  Service: Gastroenterology;  Laterality: N/A;  . ESOPHAGOGASTRODUODENOSCOPY  07/2006   multiple angioectasias  . ESOPHAGOGASTRODUODENOSCOPY Left 03/12/2015   Procedure: place tube in throat and evaluate stomach and duodenum for source of bleeding. Cauterize if concern for rebleeding.;  Surgeon: Hulen Luster, MD;  Location: [facility] ENDOSCOPY;  Service: Endoscopy;  Laterality: Left;  . ESOPHAGOGASTRODUODENOSCOPY N/A 12/14/2016   Procedure: ESOPHAGOGASTRODUODENOSCOPY (EGD);  Surgeon: Manya Silvas, MD;  Location: [facility] ENDOSCOPY;  Service: Endoscopy;  Laterality: N/A;  . ESOPHAGOGASTRODUODENOSCOPY N/A 07/19/2017   Procedure: ESOPHAGOGASTRODUODENOSCOPY (EGD);  Surgeon: Lin Landsman, MD;  Location: [facility] ENDOSCOPY;  Service: Gastroenterology;  Laterality: N/A;  . ESOPHAGOGASTRODUODENOSCOPY (EGD) WITH PROPOFOL N/A 11/23/2015   Procedure: ESOPHAGOGASTRODUODENOSCOPY (EGD) WITH PROPOFOL;  Surgeon: Hulen Luster, MD;  Location: [facility] ENDOSCOPY;  Service: Gastroenterology;  Laterality: N/A;  . ESOPHAGOGASTRODUODENOSCOPY (EGD) WITH PROPOFOL N/A 07/07/2016   Procedure: ESOPHAGOGASTRODUODENOSCOPY (EGD) WITH PROPOFOL;  Surgeon: Manya Silvas, MD;  Location: [facility]  ENDOSCOPY;  Service: Endoscopy;  Laterality: N/A;  . ESOPHAGOGASTRODUODENOSCOPY (EGD) WITH PROPOFOL N/A 05/19/2017   Procedure: ESOPHAGOGASTRODUODENOSCOPY (EGD) WITH PROPOFOL;  Surgeon: Manya Silvas, MD;  Location: [facility] ENDOSCOPY;  Service: Endoscopy;  Laterality: N/A;  . ESOPHAGOGASTRODUODENOSCOPY (EGD) WITH PROPOFOL N/A 06/26/2020   Procedure: ESOPHAGOGASTRODUODENOSCOPY (EGD) WITH PROPOFOL;  Surgeon: Lucilla Lame, MD;  Location: [facility] ENDOSCOPY;  Service: Endoscopy;  Laterality: N/A;  . laryngeal polyp  10/2008   Dr. Tami Ribas  . TOP      Prior to Admission medications   Medication Sig Start Date End Date Taking? Authorizing Provider  Accu-Chek Softclix Lancets lancets  02/01/20   [provider]  acetaminophen (TYLENOL) 325 MG tablet Take 2 tablets (650 mg total) by mouth every 6 (six) hours as needed for mild pain (or Fever >/= 101). Patient not taking: Reported on 07/06/2020 06/09/18   Nicholes Mango, MD  albuterol (VENTOLIN HFA) 108 (90 Base) MCG/ACT inhaler INHALE 2 PUFFS INTO THE LUNGS EVERY 6 HOURS AS NEEDED FOR WHEEZING OR SHORTNESS OF BREATH Patient taking differently: Inhale 2 puffs into the lungs every 6 (six) hours as needed. INHALE 2 PUFFS INTO THE LUNGS EVERY 6 HOURS AS NEEDED FOR WHEEZING OR SHORTNESS OF BREATH 06/09/20   Viviana Simpler I, MD  Blood Glucose Monitoring Suppl (ACCU-CHEK GUIDE) w/Device KIT by Does not apply route.  [provider]  clonazePAM (KLONOPIN) 0.5 MG tablet TAKE 1 TO 2 TABLETS(0.5 TO 1 MG) BY MOUTH AT BEDTIME AS NEEDED FOR RESTLESS LEGS, 08/11/20   Viviana Simpler I, MD  glucose blood (ACCU-CHEK GUIDE) test strip 1 each by Other route as needed for other. Use as instructed    [provider]  lisinopril-hydrochlorothiazide (ZESTORETIC) 20-12.5 MG tablet Take 2 tablets by mouth daily. 11/04/19   Venia Carbon, MD  metFORMIN (GLUCOPHAGE-XR) 500 MG 24 hr tablet TAKE 1 TABLET(500 MG) BY MOUTH DAILY WITH BREAKFAST Patient taking  differently: Take 500 mg by mouth daily with breakfast.  02/05/20   Venia Carbon, MD  omeprazole (PRILOSEC) 40 MG capsule Take 1 capsule (40 mg total) by mouth in the morning and at bedtime. 06/26/20   Max Sane, MD  simvastatin (ZOCOR) 80 MG tablet TAKE 1 TABLET(80 MG) BY MOUTH DAILY 10/23/19   Venia Carbon, MD  sucralfate (CARAFATE) 1 g tablet Take 1 tablet (1 g total) by mouth in the morning, at noon, and at bedtime. 06/30/20   Venia Carbon, MD  vitamin B-12 (CYANOCOBALAMIN) 1000 MCG tablet Take 1,000 mcg by mouth daily.    [provider]    Allergies Nifedipine and Metformin  Family History  Problem Relation Age of Onset  . Stroke Father   . Cancer Sister        breast cancer  . Hypertension Mother   . Hypertension Other        sibling  . Diabetes Other        grandmother    Social History Social History   Tobacco Use  . Smoking status: Current Every Day Smoker    Packs/day: 0.75    Years: 63.00    Pack years: 47.25    Types: Cigarettes  . Smokeless tobacco: Never Used  Vaping Use  . Vaping Use: Never used  Substance Use Topics  . Alcohol use: No  . Drug use: No    Review of Systems Constitutional: No fever/chills.  Generalized weakness and fatigue. Eyes: No visual changes. ENT: No sore throat. Cardiovascular: Denies chest pain.  Near syncope. Respiratory: Mild dyspnea on exertion. Gastrointestinal: No abdominal pain.  No nausea, no vomiting.  No diarrhea.  No constipation.  +Dark/tarry stools. Genitourinary: Negative for dysuria. Musculoskeletal: Negative for neck pain.  Negative for back pain. Integumentary: Negative for rash. Neurological: Negative for headaches, focal weakness or numbness.   ____________________________________________   PHYSICAL EXAM:  VITAL SIGNS: ED Triage Vitals  Enc Vitals Group     BP 08/22/20 0308 (!) 137/49     Pulse Rate 08/22/20 0307 (!) 103     Resp 08/22/20 0307 18     Temp 08/22/20 0307 97.7 F  (36.5 C)     Temp Source 08/22/20 0307 Oral     SpO2 08/22/20 0307 100 %     Weight 08/22/20 0309 46.7 kg (103 lb)     Height 08/22/20 0309 1.626 m (_0 )     Head Circumference --      Peak Flow --      Pain Score 08/22/20 0308 6     Pain Loc --      Pain Edu? --      Excl. in [place]? --     Constitutional: Alert and oriented.  Eyes: Conjunctivae are normal.  Head: Atraumatic. Nose: No congestion/rhinnorhea. Mouth/Throat: Patient is wearing a mask. Neck: No stridor.  No meningeal signs.   Cardiovascular: Normal rate,  regular rhythm. Good peripheral circulation. Grossly normal heart sounds. Respiratory: Normal respiratory effort.  No retractions. Gastrointestinal: Soft and nontender. No distention.  Rectal:  Dark (but not melanotic) stool that is strongly Hemoccult positive.  No gross blood observed.  ED chaperone present throughout exam. Musculoskeletal: No lower extremity tenderness nor edema. No gross deformities of extremities. Neurologic:  Normal speech and language. No gross focal neurologic deficits are appreciated.  Skin:  Skin is warm, dry and intact. Psychiatric: Mood and affect are normal. Speech and behavior are normal.  ____________________________________________   LABS (all labs ordered are listed, but only abnormal results are displayed)  Labs Reviewed  COMPREHENSIVE METABOLIC PANEL - Abnormal; Notable for the following components:      Result Value   CO2 21 (*)    Glucose, Bld 199 (*)    BUN 36 (*)    Creatinine, Ser 1.26 (*)    GFR, Estimated 44 (*)    All other components within normal limits  LACTIC ACID, PLASMA - Abnormal; Notable for the following components:   Lactic Acid, Venous 2.2 (*)    All other components within normal limits  CBC WITH DIFFERENTIAL/PLATELET - Abnormal; Notable for the following components:   RBC 2.53 (*)    Hemoglobin 6.5 (*)    HCT 21.1 (*)    MCH 25.7 (*)    RDW 17.3 (*)    All other components within normal limits   TROPONIN I (HIGH SENSITIVITY) - Abnormal; Notable for the following components:   Troponin I (High Sensitivity) 30 (*)    All other components within normal limits  RESPIRATORY PANEL BY RT PCR (FLU A&B, COVID)  LIPASE, BLOOD  LACTIC ACID, PLASMA  URINALYSIS, COMPLETE (UACMP) WITH MICROSCOPIC  HEMOGLOBIN A1C  TYPE AND SCREEN  PREPARE RBC (CROSSMATCH)  TROPONIN I (HIGH SENSITIVITY)   ____________________________________________  EKG  ED ECG REPORT I, Hinda Kehr, the attending physician, personally viewed and interpreted this ECG.  Date: 08/22/2020 EKG Time: 3:42 Rate: 91 Rhythm: normal sinus rhythm QRS Axis: normal Intervals: LVH ST/T Wave abnormalities: Non-specific ST segment / T-wave changes, but no clear evidence of acute ischemia. Narrative Interpretation: no definitive evidence of acute ischemia; does not meet STEMI criteria.  ____________________________________________  RADIOLOGY I, Hinda Kehr, personally viewed and evaluated these images (plain radiographs) as part of my medical decision making, as well as reviewing the written report by the radiologist.  ED MD interpretation: No indication for emergent imaging  Official radiology report(s): No results found.  ____________________________________________   PROCEDURES   Procedure(s) performed (including Critical Care):  .Critical Care Performed by: Hinda Kehr, MD Authorized by: Hinda Kehr, MD   Critical care provider statement:    Critical care time (minutes):  30   Critical care time was exclusive of:  Separately billable procedures and treating other patients   Critical care was necessary to treat or prevent imminent or life-threatening deterioration of the following conditions: GI bleeding with symptomatic anemia requiring blood transfusion.   Critical care was time spent personally by me on the following activities:  Development of treatment plan with patient or surrogate, discussions with  consultants, evaluation of patient's response to treatment, examination of patient, obtaining history from patient or surrogate, ordering and performing treatments and interventions, ordering and review of laboratory studies, ordering and review of radiographic studies, pulse oximetry, re-evaluation of patient's condition and review of old charts .1-3 Lead EKG Interpretation Performed by: Hinda Kehr, MD Authorized by: Hinda Kehr, MD     Interpretation:  normal     ECG rate:  93   ECG rate assessment: normal     Rhythm: sinus rhythm     Ectopy: none     Conduction: normal       ____________________________________________   INITIAL IMPRESSION / MDM / ASSESSMENT AND PLAN / ED COURSE  As part of my medical decision making, I reviewed the following data within the [place] notes reviewed and incorporated, Labs reviewed , EKG interpreted , Old chart reviewed, Discussed with admitting physician  and Notes from prior ED visits   Differential diagnosis includes, but is not limited to, AV malformation, diverticulosis, neoplasm, gastritis, esophagitis, other nonspecific GI bleeding disorders.  Patient has no infectious symptoms.    Vital signs are stable and she is afebrile.  Comprehensive metabolic panel is notable for a slight elevation of BUN and creatinine but not substantially above baseline.  High-sensitivity troponin is pending and lactic acid is pending.  CBC is notable for no leukocytosis but a hemoglobin of 6.5 which is down from about 11 approximately 1 month ago.  Her physical exam is reassuring with no respiratory symptoms, no chest pain, and no abdominal tenderness to palpation, but she had a strongly Hemoccult positive stool.  Symptomatic anemia with hemoglobin less than 7.  I consented the patient for blood products and ordered 1 unit packed red blood cells.  I reviewed the medical record and see that she was evaluated thoroughly within the last 2  months and received 3 units of packed red blood cells during her hospitalization at that time.  I will start with 1 unit and the patient understands and agrees with the plan and need to stay in the hospital.  I will consult the hospitalist service.  Given a history of the medical record of probable gastritis and the probability that this is coming from upper GI source, I ordered pantoprazole IV bolus plus infusion.  Also ordered normal saline 125 mL/h since she is to remain n.p.o.  The patient is on the cardiac monitor to evaluate for evidence of arrhythmia and/or significant heart rate changes.     Clinical Course as of Aug 22 601  Sat Aug 22, 2020  0330 The patient has a mild elevation of her troponin but I believe this is likely some very mild demand ischemia in the setting of her acute anemia   [CF]  [phone] I discussed the case by phone with Dr. Damita Dunnings with the hospitalist service, and she will admit.   [CF]  [id number] The patient is noted to have a lactate>2. With the current information available to me, I don't think the patient is in septic shock. The lactate>2, is related to GI bleeding and decreased intake.  Should correct with fluids and blood.   Lactic Acid, Venous(!!): 2.2 [CF]    Clinical Course User Index [CF] Hinda Kehr, MD     ____________________________________________  FINAL CLINICAL IMPRESSION(S) / ED DIAGNOSES  Final diagnoses:  Gastrointestinal hemorrhage, unspecified gastrointestinal hemorrhage type  Symptomatic anemia  Generalized weakness  Dyspnea on exertion  Elevated lactic acid level  Elevated troponin level     MEDICATIONS GIVEN DURING THIS VISIT:  Medications  0.9 %  sodium chloride infusion (has no administration in time range)  0.9 %  sodium chloride infusion (has no administration in time range)  pantoprazole (PROTONIX) 80 mg in sodium chloride 0.9 % 100 mL IVPB (has no administration in time range)  pantoprazole (PROTONIX) 80 mg in sodium  chloride  0.9 % 100 mL (0.8 mg/mL) infusion (has no administration in time range)  insulin aspart (novoLOG) injection 0-9 Units (has no administration in time range)  acetaminophen (TYLENOL) tablet 650 mg (has no administration in time range)    Or  acetaminophen (TYLENOL) suppository 650 mg (has no administration in time range)  morphine 2 MG/ML injection 2 mg (has no administration in time range)  ondansetron (ZOFRAN) tablet 4 mg (has no administration in time range)    Or  ondansetron (ZOFRAN) injection 4 mg (has no administration in time range)     ED Discharge Orders    None      *Please note:  Kathryn Ware was evaluated in Emergency Department on 08/22/2020 for the symptoms described in the history of present illness. She was evaluated in the context of the global COVID-19 pandemic, which necessitated consideration that the patient might be at risk for infection with the SARS-CoV-2 virus that causes COVID-19. Institutional protocols and algorithms that pertain to the evaluation of patients at risk for COVID-19 are in a state of rapid change based on information released by regulatory bodies including the CDC and federal and state organizations. These policies and algorithms were followed during the patient's care in the ED.  Some ED evaluations and interventions may be delayed as a result of limited staffing during and after the pandemic.*  Note:  This document was prepared using Dragon voice recognition software and may include unintentional dictation errors.   Hinda Kehr, MD 08/22/20 [phone]

## 2020-08-22 NOTE — Op Note (Signed)
[facility] Gastroenterology Patient Name: Kathryn Ware Procedure Date: 08/22/2020 5:21 PM MRN: [id number] Account #: [id number] Date of Birth: [birth year] Admit Type: Inpatient Age: 79 Room: [facility] ENDO ROOM 4 Gender: Female Note Status: Finalized Procedure:             Small bowel enteroscopy Indications:           Obscure gastrointestinal bleeding Providers:             [name] MD, MD Medicines:             Monitored Anesthesia Care Complications:         No immediate complications. Procedure:             Pre-Anesthesia Assessment:                        - Prior to the procedure, a History and Physical was                         performed, and patient medications, allergies and                         sensitivities were reviewed. The patient's tolerance                         of previous anesthesia was reviewed.                        - The risks and benefits of the procedure and the                         sedation options and risks were discussed with the                         patient. All questions were answered and informed                         consent was obtained.                        - ASA Grade Assessment: III - A patient with severe                         systemic disease.                        After obtaining informed consent, the endoscope was                         passed under direct vision. Throughout the procedure,                         the patient's blood pressure, pulse, and oxygen                         saturations were monitored continuously. The                         Colonoscope was introduced through the mouth and  advanced to the proximal jejunum. The small bowel                         enteroscopy was accomplished with ease. The patient                         tolerated the procedure well. Findings:      The esophagus was normal.      The stomach was normal.      There was no evidence of  significant pathology in the entire examined       duodenum.      Multiple angioectasias with no bleeding were found in the proximal       jejunum. Coagulation for bleeding prevention using argon plasma at 0.5       liters/minute and 20 watts was successful. Impression:            - Normal esophagus.                        - Normal stomach.                        - Normal examined duodenum.                        - Multiple non-bleeding angioectasias in the jejunum.                         Treated with argon plasma coagulation (APC).                        - No specimens collected. Recommendation:        - Return patient to hospital ward for ongoing care.                        - Advance diet as tolerated.                        - Watch over night if hb stable ok to go home and                         follow up with Dr Marius Ditch as an outpatient and consider                         capsule study+/- colonoscopy                        Home on PPI Procedure Code(s):     --- Professional ---                        [phone], Small intestinal endoscopy, enteroscopy beyond                         second portion of duodenum, not including ileum; with                         control of bleeding (eg, injection, bipolar cautery,  unipolar cautery, laser, heater probe, stapler, plasma                         coagulator) Diagnosis Code(s):     --- Professional ---                        K55.20, Angiodysplasia of colon without hemorrhage                        K92.2, Gastrointestinal hemorrhage, unspecified CPT copyright 2019 American Medical Association. All rights reserved. The codes documented in this report are preliminary and upon coder review may  be revised to meet current compliance requirements. [name], MD [name] MD, MD 08/22/2020 6:28:38 PM This report has been signed electronically. Number of Addenda: 0 Note Initiated On: 08/22/2020 5:21 PM Estimated Blood  Loss:  Estimated blood loss: none.      [facility]

## 2020-08-23 DIAGNOSIS — I472 Ventricular tachycardia: Secondary | ICD-10-CM

## 2020-08-23 DIAGNOSIS — K558 Other vascular disorders of intestine: Secondary | ICD-10-CM

## 2020-08-23 DIAGNOSIS — K922 Gastrointestinal hemorrhage, unspecified: Secondary | ICD-10-CM | POA: Diagnosis not present

## 2020-08-23 LAB — CBC
HCT: 22.5 % — ABNORMAL LOW (ref 36.0–46.0)
Hemoglobin: 7.2 g/dL — ABNORMAL LOW (ref 12.0–15.0)
MCH: 25.5 pg — ABNORMAL LOW (ref 26.0–34.0)
MCHC: 32 g/dL (ref 30.0–36.0)
MCV: 79.8 fL — ABNORMAL LOW (ref 80.0–100.0)
Platelets: 205 10*3/uL (ref 150–400)
RBC: 2.82 MIL/uL — ABNORMAL LOW (ref 3.87–5.11)
RDW: 17 % — ABNORMAL HIGH (ref 11.5–15.5)
WBC: 4.7 10*3/uL (ref 4.0–10.5)
nRBC: 0 % (ref 0.0–0.2)

## 2020-08-23 LAB — BASIC METABOLIC PANEL
Anion gap: 6 (ref 5–15)
BUN: 19 mg/dL (ref 8–23)
CO2: 23 mmol/L (ref 22–32)
Calcium: 9 mg/dL (ref 8.9–10.3)
Chloride: 110 mmol/L (ref 98–111)
Creatinine, Ser: 0.88 mg/dL (ref 0.44–1.00)
GFR, Estimated: >60 mL/min (ref >60)
Glucose, Bld: 116 mg/dL — ABNORMAL HIGH (ref 70–99)
Potassium: 4.2 mmol/L (ref 3.5–5.1)
Sodium: 139 mmol/L (ref 135–145)

## 2020-08-23 LAB — GLUCOSE, CAPILLARY
Glucose-Capillary: 85 mg/dL (ref 70–99)
Glucose-Capillary: 119 mg/dL — ABNORMAL HIGH (ref 70–99)
Glucose-Capillary: 73 mg/dL (ref 70–99)
Glucose-Capillary: 141 mg/dL — ABNORMAL HIGH (ref 70–99)
Glucose-Capillary: 150 mg/dL — ABNORMAL HIGH (ref 70–99)

## 2020-08-23 LAB — PREPARE RBC (CROSSMATCH)

## 2020-08-23 MED ORDER — SODIUM CHLORIDE 0.9 % IV SOLN
400.0000 mg | Freq: Once | INTRAVENOUS | Status: AC
  Administered 2020-08-23: 400 mg via INTRAVENOUS
  Filled 2020-08-23: qty 20

## 2020-08-23 MED ORDER — SODIUM CHLORIDE 0.9% IV SOLUTION: Freq: Once | INTRAVENOUS | Status: AC

## 2020-08-23 NOTE — Progress Notes (Signed)
Kathryn Ware , MD [address] [place], [place], [place], [address] [address], [place], [place], [place], [address] Phone: [phone]  Fax: [phone]   Kathryn Ware is being followed for Anemia    Subjective:  No further blood in the stool noted. Doing well no complaints.  Objective: Vital signs in last 24 hours: Vitals:   08/23/20 0815 08/23/20 0836 08/23/20 0836 08/23/20 1056  BP: (!) 127/59 139/71 139/71 131/61  Pulse: 68 71 71 60  Resp: 18 16  18   Temp: 98.6 F (37 C) 97.7 F (36.5 C) 97.7 F (36.5 C) 97.7 F (36.5 C)  TempSrc: Oral Oral Oral Oral  SpO2: 100% 100% 100% 100%  Weight:      Height:       Weight change:   Intake/Output Summary (Last 24 hours) at 08/23/2020 1145 Last data filed at 08/23/2020 1052 Gross per 24 hour  Intake 1101.35 ml  Output 200 ml  Net 901.35 ml     Exam: Heart:: Regular rate and rhythm, S1S2 present or without murmur or extra heart sounds Lungs: normal, clear to auscultation and clear to auscultation and percussion Abdomen: soft, nontender, normal bowel sounds   Lab Results: @LABTEST2 @ Micro Results: Recent Results (from the past 240 hour(s))  Respiratory Panel by RT PCR (Flu A&B, Covid) - Nasopharyngeal Swab     Status: None   Collection Time: 08/22/20  4:38 AM   Specimen: Nasopharyngeal Swab  Result Value Ref Range Status   SARS Coronavirus 2 by RT PCR NEGATIVE NEGATIVE Final    Comment: (NOTE) SARS-CoV-2 target nucleic acids are NOT DETECTED.  The SARS-CoV-2 RNA is generally detectable in upper respiratoy specimens during the acute phase of infection. The lowest concentration of SARS-CoV-2 viral copies this assay can detect is 131 copies/mL. A negative result does not preclude SARS-Cov-2 infection and should not be used as the sole basis for treatment or other patient management decisions. A negative result may occur with  improper specimen collection/handling, submission of specimen other than  nasopharyngeal swab, presence of viral mutation(s) within the areas targeted by this assay, and inadequate number of viral copies (<131 copies/mL). A negative result must be combined with clinical observations, patient history, and epidemiological information. The expected result is Negative.  Fact Sheet for Patients:  PinkCheek.be  Fact Sheet for Healthcare Providers:  GravelBags.it  This test is no t yet approved or cleared by the [place] FDA and  has been authorized for detection and/or diagnosis of SARS-CoV-2 by FDA under an Emergency Use Authorization (EUA). This EUA will remain  in effect (meaning this test can be used) for the duration of the COVID-19 declaration under Section 564(b)(1) of the Act, 21 U.S.C. section 360bbb-3(b)(1), unless the authorization is terminated or revoked sooner.     Influenza A by PCR NEGATIVE NEGATIVE Final   Influenza B by PCR NEGATIVE NEGATIVE Final    Comment: (NOTE) The Xpert Xpress SARS-CoV-2/FLU/RSV assay is intended as an aid in  the diagnosis of influenza from Nasopharyngeal swab specimens and  should not be used as a sole basis for treatment. Nasal washings and  aspirates are unacceptable for Xpert Xpress SARS-CoV-2/FLU/RSV  testing.  Fact Sheet for Patients: PinkCheek.be  Fact Sheet for Healthcare Providers: GravelBags.it  This test is not yet approved or cleared by the [place] FDA and  has been authorized for detection and/or diagnosis of SARS-CoV-2 by  FDA under an Emergency Use Authorization (EUA). This EUA will remain  in  effect (meaning this test can be used) for the duration of the  Covid-19 declaration under Section 564(b)(1) of the Act, 21  U.S.C. section 360bbb-3(b)(1), unless the authorization is  terminated or revoked. Performed at [facility], [address] [place], [place]  [address]    Studies/Results: No results found. Medications: I have reviewed the patient's current medications. Scheduled Meds: . atorvastatin  40 mg Oral q1800  . insulin aspart  0-9 Units Subcutaneous Q4H   Continuous Infusions: . iron sucrose 400 mg (08/23/20 1126)  . pantoprozole (PROTONIX) infusion 8 mg/hr (08/22/20 2142)   PRN Meds:.acetaminophen **OR** acetaminophen, albuterol, clonazePAM, metoprolol tartrate, morphine injection, ondansetron **OR** ondansetron (ZOFRAN) IV   Assessment: Principal Problem:   Acute blood loss anemia Active Problems:   Type 2 diabetes mellitus with hyperlipidemia (HCC)   Essential hypertension   Angiodysplasia of intestinal tract   Acute on chronic blood loss anemia   AKI (acute kidney injury) ([facility] [place])   Symptomatic anemia   Benign essential hypertension   GI bleed   Chronic blood loss anemia   Kathryn Ware 79 y.o. female S/p EGD for anemia and AVM's of small bowel ablated with APC.  Hb dropped overnight along with creatinine levels suggesting drop is due to dilution and recovery from AKI.   Plan: 1. Monitor CBC and transfuse as needed  2. If stable and has no overt bleeding can go home and follow up with Dr Marius Ditch in the outpatient .  3. Long term will need to stay on PPI 4. IV iron if not already received . Ferritin 13 in 06/2020   LOS: 1 day   Kathryn Bellows, MD 08/23/2020, 11:45 AM

## 2020-08-23 NOTE — Progress Notes (Signed)
Sepsis screen performed and is negative. Will continue to monitor. 

## 2020-08-23 NOTE — Progress Notes (Signed)
Patient ID: Kathryn Ware, female   DOB: [birth year], 79 y.o.   MRN: [id number] [facility] PROGRESS NOTE  Kathryn Ware QRF:[id number] DOB: 1941/07/17 DOA: 08/22/2020 PCP: Venia Carbon, MD  HPI/Subjective: Patient is feeling okay.  Offers no complaints.  No shortness of breath or chest pain.  Came in with weakness.  Objective: Vitals:   08/23/20 0836 08/23/20 1056  BP: 139/71 131/61  Pulse: 71 60  Resp:  18  Temp: 97.7 F (36.5 C) 97.7 F (36.5 C)  SpO2: 100% 100%    Intake/Output Summary (Last 24 hours) at 08/23/2020 1143 Last data filed at 08/23/2020 1052 Gross per 24 hour  Intake 1101.35 ml  Output 200 ml  Net 901.35 ml   Filed Weights   08/22/20 0309  Weight: 46.7 kg    ROS: Review of Systems  Respiratory: Negative for shortness of breath.   Cardiovascular: Negative for chest pain.  Gastrointestinal: Negative for abdominal pain, nausea and vomiting.   Exam: Physical Exam HENT:     Head: Normocephalic.     Mouth/Throat:     Pharynx: No oropharyngeal exudate.  Eyes:     General: Lids are normal.     Conjunctiva/sclera: Conjunctivae normal.     Pupils: Pupils are equal, round, and reactive to light.  Cardiovascular:     Rate and Rhythm: Normal rate and regular rhythm.     Heart sounds: S1 normal and S2 normal. Murmur heard.  Systolic murmur is present with a grade of 3/6.   Pulmonary:     Breath sounds: No decreased breath sounds, wheezing, rhonchi or rales.  Abdominal:     Palpations: Abdomen is soft.     Tenderness: There is no abdominal tenderness.  Musculoskeletal:     Right ankle: No swelling.     Left ankle: No swelling.  Skin:    General: Skin is warm.     Findings: No rash.  Neurological:     Mental Status: She is alert and oriented to person, place, and time.       Data Reviewed: Basic Metabolic Panel: Recent Labs  Lab 08/22/20 0306 08/23/20 0407  NA 138 139  K 4.8 4.2  CL 108 110  CO2 21* 23  GLUCOSE 199* 116*  BUN  36* 19  CREATININE 1.26* 0.88  CALCIUM 9.3 9.0   Liver Function Tests: Recent Labs  Lab 08/22/20 0306  AST 24  ALT 8  ALKPHOS 40  BILITOT 0.6  PROT 6.9  ALBUMIN 3.9   Recent Labs  Lab 08/22/20 0306  LIPASE 34   CBC: Recent Labs  Lab 08/22/20 0306 08/22/20 0820 08/23/20 0407  WBC 5.0  --  4.7  NEUTROABS 3.4  --   --   HGB 6.5* 10.9* 7.2*  HCT 21.1*  --  22.5*  MCV 83.4  --  79.8*  PLT 250  --  205   CarBNP (last 3 results) Recent Labs    01/05/20 0843  BNP 343.0*    CBG: Recent Labs  Lab 08/22/20 2106 08/23/20 0103 08/23/20 0412 08/23/20 0749 08/23/20 1139  GLUCAP 154* 85 119* 73 141*    Recent Results (from the past 240 hour(s))  Respiratory Panel by RT PCR (Flu A&B, Covid) - Nasopharyngeal Swab     Status: None   Collection Time: 08/22/20  4:38 AM   Specimen: Nasopharyngeal Swab  Result Value Ref Range Status   SARS Coronavirus 2 by RT PCR NEGATIVE NEGATIVE Final    Comment: (NOTE) SARS-CoV-2  target nucleic acids are NOT DETECTED.  The SARS-CoV-2 RNA is generally detectable in upper respiratoy specimens during the acute phase of infection. The lowest concentration of SARS-CoV-2 viral copies this assay can detect is 131 copies/mL. A negative result does not preclude SARS-Cov-2 infection and should not be used as the sole basis for treatment or other patient management decisions. A negative result may occur with  improper specimen collection/handling, submission of specimen other than nasopharyngeal swab, presence of viral mutation(s) within the areas targeted by this assay, and inadequate number of viral copies (<131 copies/mL). A negative result must be combined with clinical observations, patient history, and epidemiological information. The expected result is Negative.  Fact Sheet for Patients:  PinkCheek.be  Fact Sheet for Healthcare Providers:  GravelBags.it  This test is no t  yet approved or cleared by the [place] FDA and  has been authorized for detection and/or diagnosis of SARS-CoV-2 by FDA under an Emergency Use Authorization (EUA). This EUA will remain  in effect (meaning this test can be used) for the duration of the COVID-19 declaration under Section 564(b)(1) of the Act, 21 U.S.C. section 360bbb-3(b)(1), unless the authorization is terminated or revoked sooner.     Influenza A by PCR NEGATIVE NEGATIVE Final   Influenza B by PCR NEGATIVE NEGATIVE Final    Comment: (NOTE) The Xpert Xpress SARS-CoV-2/FLU/RSV assay is intended as an aid in  the diagnosis of influenza from Nasopharyngeal swab specimens and  should not be used as a sole basis for treatment. Nasal washings and  aspirates are unacceptable for Xpert Xpress SARS-CoV-2/FLU/RSV  testing.  Fact Sheet for Patients: PinkCheek.be  Fact Sheet for Healthcare Providers: GravelBags.it  This test is not yet approved or cleared by the [place] FDA and  has been authorized for detection and/or diagnosis of SARS-CoV-2 by  FDA under an Emergency Use Authorization (EUA). This EUA will remain  in effect (meaning this test can be used) for the duration of the  Covid-19 declaration under Section 564(b)(1) of the Act, 21  U.S.C. section 360bbb-3(b)(1), unless the authorization is  terminated or revoked. Performed at [facility], [address], [place], [place] [address]       Scheduled Meds: . atorvastatin  40 mg Oral q1800  . insulin aspart  0-9 Units Subcutaneous Q4H   Continuous Infusions: . iron sucrose 400 mg (08/23/20 1126)  . pantoprozole (PROTONIX) infusion 8 mg/hr (08/22/20 2142)    Assessment/Plan:  1. Acute on chronic blood loss anemia.  Patient's initial hemoglobin was 6.5 then after transfusion came up to 10.9.  This number was probably falsely high.  Repeat this morning 7.2.  We will give another unit  of blood and IV iron today and recheck hemoglobin tomorrow.  Patient will need to be set up with hematology for IV iron as outpatient. 2. Angiodysplasias seen in the jejunum which were treated with argon plasma coagulation.  Patient will need a capsule endoscopy as outpatient. 3. Type 2 diabetes mellitus with hyperlipidemia.  Patient on Lipitor.  Patient's hemoglobin A1c is actually low at 5.9.  I will get rid of sliding scale.      Code Status:     Code Status Orders  (From admission, onward)         Start     Ordered   08/22/20 0501  Full code  Continuous        08/22/20 0502        Code Status History    Date  Active Date Inactive Code Status Order ID Comments User Context   06/24/2020 1936 06/26/2020 1955 Full Code [id number]  Sidney Ace Arvella Merles, MD ED   01/05/2020 1402 01/07/2020 2055 Full Code [id number]  Ivor Costa, MD ED   06/07/2018 1746 06/09/2018 1716 Full Code [id number]  Demetrios Loll, MD Inpatient   07/18/2017 1823 07/19/2017 2110 Full Code [id number]  Baxter Hire, MD Inpatient   07/14/2017 2248 07/15/2017 1752 Full Code [id number]  Lance Coon, MD Inpatient   05/10/2017 2000 05/12/2017 1404 Full Code [id number]  Vaughan Basta, MD Inpatient   07/05/2016 1301 07/08/2016 1402 Full Code [id number]  Fritzi Mandes, MD Inpatient   10/13/2015 2053 10/14/2015 1636 Full Code [id number]  Dustin Flock, MD Inpatient   03/10/2015 2131 03/13/2015 1311 Full Code [id number]  Theodoro Grist, MD Inpatient   Advance Care Planning Activity     Family Communication: Spoke with the patient's sister on the phone Disposition Plan: Status is: Inpatient  Dispo: The patient is from: Home              Anticipated d/c is to: Home              Anticipated d/c date is: Potential 08/24/2020              Patient currently going to receive another unit of blood today and IV iron.  Time spent: 28 minutes  [place]

## 2020-08-23 NOTE — Plan of Care (Signed)

## 2020-08-24 ENCOUNTER — Telehealth: Payer: Self-pay

## 2020-08-24 ENCOUNTER — Telehealth: Payer: Self-pay | Admitting: Gastroenterology

## 2020-08-24 ENCOUNTER — Encounter: Payer: Self-pay | Admitting: Gastroenterology

## 2020-08-24 DIAGNOSIS — R000 Tachycardia, unspecified: Secondary | ICD-10-CM

## 2020-08-24 LAB — BPAM RBC
Blood Product Expiration Date: 202111122359
Blood Product Expiration Date: 202111222359
Blood Product Expiration Date: 202111252359
ISSUE DATE / TIME: 202110300751
ISSUE DATE / TIME: 202110310810
ISSUE DATE / TIME: 202110311347
Unit Type and Rh: 1700
Unit Type and Rh: 7300
Unit Type and Rh: 7300

## 2020-08-24 LAB — TYPE AND SCREEN
ABO/RH(D): B POS
Antibody Screen: NEGATIVE
Unit division: 0
Unit division: 0
Unit division: 0

## 2020-08-24 LAB — CBC
HCT: 32.7 % — ABNORMAL LOW (ref 36.0–46.0)
Hemoglobin: 10.9 g/dL — ABNORMAL LOW (ref 12.0–15.0)
MCH: 26.5 pg (ref 26.0–34.0)
MCHC: 33.3 g/dL (ref 30.0–36.0)
MCV: 79.4 fL — ABNORMAL LOW (ref 80.0–100.0)
Platelets: 245 10*3/uL (ref 150–400)
RBC: 4.12 MIL/uL (ref 3.87–5.11)
RDW: 16.4 % — ABNORMAL HIGH (ref 11.5–15.5)
WBC: 8 10*3/uL (ref 4.0–10.5)
nRBC: 0 % (ref 0.0–0.2)

## 2020-08-24 MED ORDER — NICOTINE 21 MG/24HR TD PT24
21.0000 mg | MEDICATED_PATCH | Freq: Every day | TRANSDERMAL | Status: DC
  Administered 2020-08-24: 21 mg via TRANSDERMAL
  Filled 2020-08-24: qty 1

## 2020-08-24 MED ORDER — METOPROLOL SUCCINATE ER 25 MG PO TB24: 25.0000 mg | ORAL_TABLET | Freq: Every day | ORAL | 0 refills | Status: DC

## 2020-08-24 MED ORDER — METOPROLOL SUCCINATE ER 25 MG PO TB24
12.5000 mg | ORAL_TABLET | Freq: Every day | ORAL | Status: DC
  Administered 2020-08-24: 12.5 mg via ORAL
  Filled 2020-08-24: qty 1

## 2020-08-24 MED ORDER — NICOTINE 21 MG/24HR TD PT24
MEDICATED_PATCH | TRANSDERMAL | 0 refills | Status: DC
Start: 2020-08-24 — End: 2020-09-01

## 2020-08-24 MED ORDER — PANTOPRAZOLE SODIUM 40 MG PO TBEC
40.0000 mg | DELAYED_RELEASE_TABLET | Freq: Every day | ORAL | Status: DC
  Administered 2020-08-24: 40 mg via ORAL
  Filled 2020-08-24: qty 1

## 2020-08-24 NOTE — Discharge Instructions (Signed)
Anemia  Anemia is a condition in which you do not have enough red blood cells or hemoglobin. Hemoglobin is a substance in red blood cells that carries oxygen. When you do not have enough red blood cells or hemoglobin (are anemic), your body cannot get enough oxygen and your organs may not work properly. As a result, you may feel very tired or have other problems. What are the causes? Common causes of anemia include:  Excessive bleeding. Anemia can be caused by excessive bleeding inside or outside the body, including bleeding from the intestine or from periods in women.  Poor nutrition.  Long-lasting (chronic) kidney, thyroid, and liver disease.  Bone marrow disorders.  Cancer and treatments for cancer.  HIV (human immunodeficiency virus) and AIDS (acquired immunodeficiency syndrome).  Treatments for HIV and AIDS.  Spleen problems.  Blood disorders.  Infections, medicines, and autoimmune disorders that destroy red blood cells. What are the signs or symptoms? Symptoms of this condition include:  Minor weakness.  Dizziness.  Headache.  Feeling heartbeats that are irregular or faster than normal (palpitations).  Shortness of breath, especially with exercise.  Paleness.  Cold sensitivity.  Indigestion.  Nausea.  Difficulty sleeping.  Difficulty concentrating. Symptoms may occur suddenly or develop slowly. If your anemia is mild, you may not have symptoms. How is this diagnosed? This condition is diagnosed based on:  Blood tests.  Your medical history.  A physical exam.  Bone marrow biopsy. Your health care provider may also check your stool (feces) for blood and may do additional testing to look for the cause of your bleeding. You may also have other tests, including:  Imaging tests, such as a CT scan or MRI.  Endoscopy.  Colonoscopy. How is this treated? Treatment for this condition depends on the cause. If you continue to lose a lot of blood, you may  need to be treated at a hospital. Treatment may include:  Taking supplements of iron, vitamin S31, or folic acid.  Taking a hormone medicine (erythropoietin) that can help to stimulate red blood cell growth.  Having a blood transfusion. This may be needed if you lose a lot of blood.  Making changes to your diet.  Having surgery to remove your spleen. Follow these instructions at home:  Take over-the-counter and prescription medicines only as told by your health care provider.  Take supplements only as told by your health care provider.  Follow any diet instructions that you were given.  Keep all follow-up visits as told by your health care provider. This is important. Contact a health care provider if:  You develop new bleeding anywhere in the body. Get help right away if:  You are very weak.  You are short of breath.  You have pain in your abdomen or chest.  You are dizzy or feel faint.  You have trouble concentrating.  You have bloody or black, tarry stools.  You vomit repeatedly or you vomit up blood. Summary  Anemia is a condition in which you do not have enough red blood cells or enough of a substance in your red blood cells that carries oxygen (hemoglobin).  Symptoms may occur suddenly or develop slowly.  If your anemia is mild, you may not have symptoms.  This condition is diagnosed with blood tests as well as a medical history and physical exam. Other tests may be needed.  Treatment for this condition depends on the cause of the anemia. This information is not intended to replace advice given to you by  your health care provider. Make sure you discuss any questions you have with your health care provider. Document Revised: 09/22/2017 Document Reviewed: 11/11/2016 Elsevier Patient Education  [place].

## 2020-08-24 NOTE — Telephone Encounter (Signed)
A Nurse from [facility] called to schedule a hospital follow up with Dr. Vicente Males and patient needed a capsule study done by next week. I tried explaining to the nurse that the patient was a Dr. Verlin Grills patient. Clinical staff was informed.

## 2020-08-24 NOTE — Plan of Care (Signed)
Axox4. Calm and cooperative and able to voice her needs. Pt had two episodes overnight of increased HR up to 150 for a few seconds. MD notified and EKG obtained and read by MD. Pt states that sometimes she can feel her heart palpitations. She told this Nurse this his happening because she needs to smoke. She agrees to have a nicotine patch. Nicotine patch applied. Pt remains on protonix drip at 8mg /hr. Pt able to call for any needs and is able to ambulate to the restroom with standby assist. She takes her pills whole. She denied pain all night. Safety measures in place. Will continue to monitor.  Problem: Education: Goal: Knowledge of General Education information will improve Description: Including pain rating scale, medication(s)/side effects and non-pharmacologic comfort measures Outcome: Progressing   Problem: Health Behavior/Discharge Planning: Goal: Ability to manage health-related needs will improve Outcome: Progressing   Problem: Clinical Measurements: Goal: Ability to maintain clinical measurements within normal limits will improve Outcome: Progressing Goal: Will remain free from infection Outcome: Progressing Goal: Diagnostic test results will improve Outcome: Progressing Goal: Respiratory complications will improve Outcome: Progressing Goal: Cardiovascular complication will be avoided Outcome: Progressing   Problem: Activity: Goal: Risk for activity intolerance will decrease Outcome: Progressing   Problem: Nutrition: Goal: Adequate nutrition will be maintained Outcome: Progressing   Problem: Coping: Goal: Level of anxiety will decrease Outcome: Progressing   Problem: Elimination: Goal: Will not experience complications related to bowel motility Outcome: Progressing Goal: Will not experience complications related to urinary retention Outcome: Progressing   Problem: Pain Managment: Goal: General experience of comfort will improve Outcome: Progressing   Problem:  Safety: Goal: Ability to remain free from injury will improve Outcome: Progressing   Problem: Skin Integrity: Goal: Risk for impaired skin integrity will decrease Outcome: Progressing

## 2020-08-24 NOTE — Telephone Encounter (Signed)
Transition Care Management Follow-up Telephone Call  Date of discharge and from where: 08/24/2020, [facility]  How have you been since you were released from the hospital? Patient states that she is feeling much better.  Any questions or concerns? No  Items Reviewed:  Did the pt receive and understand the discharge instructions provided? Yes   Medications obtained and verified? Yes   Other? No   Any new allergies since your discharge? No   Dietary orders reviewed? Yes  Do you have support at home? Yes   Home Care and Equipment/Supplies: Were home health services ordered? not applicable If so, what is the name of the agency? N/A  Has the agency set up a time to come to the patient's home? not applicable Were any new equipment or medical supplies ordered?  No What is the name of the medical supply agency? N/A Were you able to get the supplies/equipment? not applicable Do you have any questions related to the use of the equipment or supplies? No  Functional Questionnaire: (I = Independent and D = Dependent) ADLs: I  Bathing/Dressing- I  Meal Prep- I  Eating- I  Maintaining continence- I  Transferring/Ambulation- I  Managing Meds- I  Follow up appointments reviewed:   PCP Hospital f/u appt confirmed? Yes  Scheduled to see Dr. Silvio Pate on 09/01/2020 @ 11 am.  [facility] f/u appt confirmed? Yes  Scheduled to see gastroenterology  Are transportation arrangements needed? No   If their condition worsens, is the pt aware to call PCP or go to the Emergency Dept.? Yes  Was the patient provided with contact information for the PCP's office or ED? Yes  Was to pt encouraged to call back with questions or concerns? Yes

## 2020-08-24 NOTE — Discharge Summary (Signed)
[facility] NAME: Kathryn Ware    MR#:  [id number]  DATE OF BIRTH:  18-Jan-1941  DATE OF ADMISSION:  08/22/2020 ADMITTING PHYSICIAN: Loletha Grayer, MD  DATE OF DISCHARGE: 08/24/2020 11:18 AM  PRIMARY CARE PHYSICIAN: Venia Carbon, MD    ADMISSION DIAGNOSIS:  Chronic blood loss anemia [D50.0] Dyspnea on exertion [R06.00] GI bleed [K92.2] Elevated troponin level [R77.8] Generalized weakness [R53.1] Elevated lactic acid level [R79.89] Symptomatic anemia [D64.9] Gastrointestinal hemorrhage, unspecified gastrointestinal hemorrhage type [K92.2]  DISCHARGE DIAGNOSIS:  Principal Problem:   Acute blood loss anemia Active Problems:   Type 2 diabetes mellitus with hyperlipidemia (HCC)   Essential hypertension   Angiodysplasia of small intestine (HCC)   Acute on chronic blood loss anemia   AKI (acute kidney injury) ([place])   Symptomatic anemia   Benign essential hypertension   GI bleed   Chronic blood loss anemia   SECONDARY DIAGNOSIS:   Past Medical History:  Diagnosis Date  . Anemia   . ARF (acute renal failure) ([place])   . Blood transfusion without reported diagnosis   . Diabetes mellitus type II   . GI (gastrointestinal bleed)   . GI AVM (gastrointestinal arteriovenous vascular malformation)    stomach  . History of echocardiogram 12/2013   a. 12/2013: EF 60-65%, DD, mild LVH, nl RV size & systolic function, mild MR/TR, mild AS, nl RVSP; b. TTE 04/2017: EF 60-65%, normal wall motion, GR1DD, mild MR, left atrium normal in size, normal RV systolic function, PASP normal   . History of nuclear stress test    a. 12/2013: low risk, no sig WMA, nondiag EKG 2/2 baseline LVH w/ repol abnl, no sig ischemia, EF 70% no artifact  . HLD (hyperlipidemia)   . HTN (hypertension)   . Hypercholesterolemia   . IDA (iron deficiency anemia)   . Polyp of colon, adenomatous   . RLS (restless legs syndrome)     HOSPITAL COURSE:   1.  Acute  on chronic blood loss anemia.  Patient came in feeling weak and felt like she was going to pass out.  Her initial hemoglobin was 6.5.  She was given 2 units of packed red blood cells and hemoglobin came up to 10.9.  The next morning hemoglobin was down to 7.2.  I did give another unit of packed red blood cells and hemoglobin did come up to 7.9 again.  I did give IV iron during the hospital course.  Patient had an endoscopy which showed angiodysplasias in the jejunum which were treated with argon plasma coagulation.  Will refer to hematology as outpatient for IV iron infusions.  Patient will have to keep up with what she is losing. 2.  Angiodysplasias seen in the jejunum which were treated with argon plasma coagulation by Dr. Vicente Males.  Patient will need a capsule endoscopy as outpatient will be referred to gastroenterology as outpatient. 3.  Essential hypertension and tachycardia.  Heart rate overnight when she got up to go the bathroom went up briefly as high as 150 sinus tachycardia.  Patient was started on low-dose Toprol-XL at night. 4.  Type 2 diabetes mellitus with hyperlipidemia.  Patient on Lipitor.  Hemoglobin A1c actually low at 5.9.  Get rid of Metformin. 5.  Acute kidney injury with dehydration.  Creatinine 1.26 on presentation normalized down to 0.88 upon discharge.  DISCHARGE CONDITIONS:   Satisfactory  CONSULTS OBTAINED:  Gastroenterology  DRUG ALLERGIES:   Allergies  Allergen Reactions  .  Nifedipine     REACTION: cough  . Metformin Other (See Comments)    REACTION: diarrhea when dosage is increased, ok when taking one tablet daily, patient aware that she is on metformin when it is listed that she is allergic    DISCHARGE MEDICATIONS:   Allergies as of 08/24/2020      Reactions   Nifedipine    REACTION: cough   Metformin Other (See Comments)   REACTION: diarrhea when dosage is increased, ok when taking one tablet daily, patient aware that she is on metformin when it is listed  that she is allergic      Medication List    STOP taking these medications   acetaminophen 325 MG tablet Commonly known as: TYLENOL   lisinopril-hydrochlorothiazide 20-12.5 MG tablet Commonly known as: ZESTORETIC   metFORMIN 500 MG 24 hr tablet Commonly known as: GLUCOPHAGE-XR   sucralfate 1 g tablet Commonly known as: CARAFATE     TAKE these medications   Accu-Chek Guide test strip Generic drug: glucose blood 1 each by Other route as needed for other. Use as instructed   Accu-Chek Guide w/Device Kit by Does not apply route.   Accu-Chek Softclix Lancets lancets   albuterol 108 (90 Base) MCG/ACT inhaler Commonly known as: VENTOLIN HFA INHALE 2 PUFFS INTO THE LUNGS EVERY 6 HOURS AS NEEDED FOR WHEEZING OR SHORTNESS OF BREATH What changed: See the new instructions.   clonazePAM 0.5 MG tablet Commonly known as: KLONOPIN TAKE 1 TO 2 TABLETS(0.5 TO 1 MG) BY MOUTH AT BEDTIME AS NEEDED FOR RESTLESS LEGS,   metoprolol succinate 25 MG 24 hr tablet Commonly known as: Toprol XL Take 1 tablet (25 mg total) by mouth at bedtime.   nicotine 21 mg/24hr patch Commonly known as: NICODERM CQ - dosed in mg/24 hours One patch chest wall daily (okay to substitute generic) (don't use if smoking)   omeprazole 40 MG capsule Commonly known as: PRILOSEC Take 1 capsule (40 mg total) by mouth in the morning and at bedtime.   simvastatin 80 MG tablet Commonly known as: ZOCOR TAKE 1 TABLET(80 MG) BY MOUTH DAILY   vitamin B-12 1000 MCG tablet Commonly known as: CYANOCOBALAMIN Take 1,000 mcg by mouth daily.        DISCHARGE INSTRUCTIONS:   Follow-up PMD 5 days Follow-up hematology 3 weeks Follow-up with gastroenterology 1 to 2 weeks  If you experience worsening of your admission symptoms, develop shortness of breath, life threatening emergency, suicidal or homicidal thoughts you must seek medical attention immediately by calling 911 or calling your MD immediately  if symptoms less  severe.  You Must read complete instructions/literature along with all the possible adverse reactions/side effects for all the Medicines you take and that have been prescribed to you. Take any new Medicines after you have completely understood and accept all the possible adverse reactions/side effects.   Please note  You were cared for by a hospitalist during your hospital stay. If you have any questions about your discharge medications or the care you received while you were in the hospital after you are discharged, you can call the unit and asked to speak with the hospitalist on call if the hospitalist that took care of you is not available. Once you are discharged, your primary care physician will handle any further medical issues. Please note that NO REFILLS for any discharge medications will be authorized once you are discharged, as it is imperative that you return to your primary care physician (or establish a  relationship with a primary care physician if you do not have one) for your aftercare needs so that they can reassess your need for medications and monitor your lab values.    Today   CHIEF COMPLAINT:   Chief Complaint  Patient presents with  . Weakness  . Headache    HISTORY OF PRESENT ILLNESS:  Kathryn Ware  is a 79 y.o. female came in not feeling well and feeling like she was going to pass out.   VITAL SIGNS:  Blood pressure (!) 132/56, pulse 93, temperature (!) 97.5 F (36.4 C), temperature source Oral, resp. rate 16, height _0  (1.626 m), weight 46.7 kg, SpO2 100 %.   PHYSICAL EXAMINATION:  GENERAL:  79 y.o.-year-old patient lying in the bed with no acute distress.  EYES: Pupils equal, round, reactive to light and accommodation. No scleral icterus. HEENT: Head atraumatic, normocephalic. Oropharynx and nasopharynx clear.  LUNGS: Normal breath sounds bilaterally, no wheezing, rales,rhonchi or crepitation. No use of accessory muscles of respiration.  CARDIOVASCULAR:  S1, S2 normal. No murmurs, rubs, or gallops.  ABDOMEN: Soft, non-tender, non-distended.  EXTREMITIES: No pedal edema.  NEUROLOGIC: Cranial nerves II through XII are intact. Muscle strength 5/5 in all extremities. Sensation intact. Gait not checked.  PSYCHIATRIC: The patient is alert and oriented x 3.  SKIN: No obvious rash, lesion, or ulcer.   DATA REVIEW:   CBC Recent Labs  Lab 08/24/20 0339  WBC 8.0  HGB 10.9*  HCT 32.7*  PLT 245    Chemistries  Recent Labs  Lab 08/22/20 0306 08/22/20 0306 08/23/20 0407  NA 138   < > 139  K 4.8   < > 4.2  CL 108   < > 110  CO2 21*   < > 23  GLUCOSE 199*   < > 116*  BUN 36*   < > 19  CREATININE 1.26*   < > 0.88  CALCIUM 9.3   < > 9.0  AST 24  --   --   ALT 8  --   --   ALKPHOS 40  --   --   BILITOT 0.6  --   --    < > = values in this interval not displayed.    Microbiology Results  Results for orders placed or performed during the hospital encounter of 08/22/20  Respiratory Panel by RT PCR (Flu A&B, Covid) - Nasopharyngeal Swab     Status: None   Collection Time: 08/22/20  4:38 AM   Specimen: Nasopharyngeal Swab  Result Value Ref Range Status   SARS Coronavirus 2 by RT PCR NEGATIVE NEGATIVE Final    Comment: (NOTE) SARS-CoV-2 target nucleic acids are NOT DETECTED.  The SARS-CoV-2 RNA is generally detectable in upper respiratoy specimens during the acute phase of infection. The lowest concentration of SARS-CoV-2 viral copies this assay can detect is 131 copies/mL. A negative result does not preclude SARS-Cov-2 infection and should not be used as the sole basis for treatment or other patient management decisions. A negative result may occur with  improper specimen collection/handling, submission of specimen other than nasopharyngeal swab, presence of viral mutation(s) within the areas targeted by this assay, and inadequate number of viral copies (<131 copies/mL). A negative result must be combined with  clinical observations, patient history, and epidemiological information. The expected result is Negative.  Fact Sheet for Patients:  PinkCheek.be  Fact Sheet for Healthcare Providers:  GravelBags.it  This test is no t yet approved or cleared by the [place]  States FDA and  has been authorized for detection and/or diagnosis of SARS-CoV-2 by FDA under an Emergency Use Authorization (EUA). This EUA will remain  in effect (meaning this test can be used) for the duration of the COVID-19 declaration under Section 564(b)(1) of the Act, 21 U.S.C. section 360bbb-3(b)(1), unless the authorization is terminated or revoked sooner.     Influenza A by PCR NEGATIVE NEGATIVE Final   Influenza B by PCR NEGATIVE NEGATIVE Final    Comment: (NOTE) The Xpert Xpress SARS-CoV-2/FLU/RSV assay is intended as an aid in  the diagnosis of influenza from Nasopharyngeal swab specimens and  should not be used as a sole basis for treatment. Nasal washings and  aspirates are unacceptable for Xpert Xpress SARS-CoV-2/FLU/RSV  testing.  Fact Sheet for Patients: PinkCheek.be  Fact Sheet for Healthcare Providers: GravelBags.it  This test is not yet approved or cleared by the [place] FDA and  has been authorized for detection and/or diagnosis of SARS-CoV-2 by  FDA under an Emergency Use Authorization (EUA). This EUA will remain  in effect (meaning this test can be used) for the duration of the  Covid-19 declaration under Section 564(b)(1) of the Act, 21  U.S.C. section 360bbb-3(b)(1), unless the authorization is  terminated or revoked. Performed at [facility], [address], [place], [place] [address]      Management plans discussed with the patient, family and they are in agreement.  CODE STATUS:     Code Status Orders  (From admission, onward)         Start      Ordered   08/22/20 0501  Full code  Continuous        08/22/20 0502        Code Status History    Date Active Date Inactive Code Status Order ID Comments User Context   06/24/2020 1936 06/26/2020 1955 Full Code [id number]  Mansy, Arvella Merles, MD ED   01/05/2020 1402 01/07/2020 2055 Full Code [id number]  Ivor Costa, MD ED   06/07/2018 1746 06/09/2018 1716 Full Code [id number]  Demetrios Loll, MD Inpatient   07/18/2017 1823 07/19/2017 2110 Full Code [id number]  Baxter Hire, MD Inpatient   07/14/2017 2248 07/15/2017 1752 Full Code [id number]  Lance Coon, MD Inpatient   05/10/2017 2000 05/12/2017 1404 Full Code [id number]  Vaughan Basta, MD Inpatient   07/05/2016 1301 07/08/2016 1402 Full Code [id number]  Fritzi Mandes, MD Inpatient   10/13/2015 2053 10/14/2015 1636 Full Code [id number]  Dustin Flock, MD Inpatient   03/10/2015 2131 03/13/2015 1311 Full Code [id number]  Theodoro Grist, MD Inpatient   Advance Care Planning Activity      TOTAL TIME TAKING CARE OF THIS PATIENT: 34 minutes.    Loletha Grayer M.D on 08/24/2020 at 2:33 PM  Between 7am to 6pm - Pager - [phone]  After 6pm go to [email or web address] - password [email or web address]  [facility]  CC: Primary care physician; Venia Carbon, MD

## 2020-08-24 NOTE — Telephone Encounter (Signed)
Had another of her frequent GI bleed/anemia spells

## 2020-08-24 NOTE — Progress Notes (Signed)
Murry J [place]ington  A and O x 4. VSS. Pt tolerating diet well. No complaints of pain or nausea. IV removed intact, prescriptions given. Pt voiced understanding of discharge instructions with no further questions. Pt discharged via wheelchair with axillary.    Allergies as of 08/24/2020      Reactions   Nifedipine    REACTION: cough   Metformin Other (See Comments)   REACTION: diarrhea when dosage is increased, ok when taking one tablet daily, patient aware that she is on metformin when it is listed that she is allergic      Medication List    STOP taking these medications   acetaminophen 325 MG tablet Commonly known as: TYLENOL   lisinopril-hydrochlorothiazide 20-12.5 MG tablet Commonly known as: ZESTORETIC   metFORMIN 500 MG 24 hr tablet Commonly known as: GLUCOPHAGE-XR   sucralfate 1 g tablet Commonly known as: CARAFATE     TAKE these medications   Accu-Chek Guide test strip Generic drug: glucose blood 1 each by Other route as needed for other. Use as instructed   Accu-Chek Guide w/Device Kit by Does not apply route.   Accu-Chek Softclix Lancets lancets   albuterol 108 (90 Base) MCG/ACT inhaler Commonly known as: VENTOLIN HFA INHALE 2 PUFFS INTO THE LUNGS EVERY 6 HOURS AS NEEDED FOR WHEEZING OR SHORTNESS OF BREATH What changed: See the new instructions.   clonazePAM 0.5 MG tablet Commonly known as: KLONOPIN TAKE 1 TO 2 TABLETS(0.5 TO 1 MG) BY MOUTH AT BEDTIME AS NEEDED FOR RESTLESS LEGS,   metoprolol succinate 25 MG 24 hr tablet Commonly known as: Toprol XL Take 1 tablet (25 mg total) by mouth at bedtime.   nicotine 21 mg/24hr patch Commonly known as: NICODERM CQ - dosed in mg/24 hours One patch chest wall daily (okay to substitute generic) (don't use if smoking)   omeprazole 40 MG capsule Commonly known as: PRILOSEC Take 1 capsule (40 mg total) by mouth in the morning and at bedtime.   simvastatin 80 MG tablet Commonly known as: ZOCOR TAKE 1 TABLET(80 MG) BY  MOUTH DAILY   vitamin B-12 1000 MCG tablet Commonly known as: CYANOCOBALAMIN Take 1,000 mcg by mouth daily.       Vitals:   08/24/20 0331 08/24/20 0831  BP: (!) 125/56 (!) 132/56  Pulse: 87 93  Resp: 18 16  Temp: 98.2 F (36.8 C) (!) 97.5 F (36.4 C)  SpO2: 99% 100%    Francesco Sor

## 2020-08-26 ENCOUNTER — Telehealth: Payer: Self-pay | Admitting: Internal Medicine

## 2020-08-26 NOTE — Telephone Encounter (Signed)
That was added due to her heart going very fast while in the hospital. That certainly could have been from the anemia, etc. If she feels okay, and her heart is not going fast, okay to hold off on the metoprolol until she comes in for her hospital follow up

## 2020-08-26 NOTE — Telephone Encounter (Signed)
Patient called in asking if she should take a script for metoprolol succinate (TOPROL XL) 25 MG 24 hr tablet that she was given in the hospital. Pt does not feel comfortable taking without hearing its ok from PCP. Stated she was given blood infusions while at hospital. Please advise.

## 2020-08-26 NOTE — Telephone Encounter (Signed)
Spoke to pt's sister. Pt has hospital f/u on the 18th.

## 2020-09-01 ENCOUNTER — Ambulatory Visit (INDEPENDENT_AMBULATORY_CARE_PROVIDER_SITE_OTHER): Payer: Medicare HMO | Admitting: Internal Medicine

## 2020-09-01 ENCOUNTER — Other Ambulatory Visit: Payer: Self-pay

## 2020-09-01 ENCOUNTER — Encounter: Payer: Self-pay | Admitting: Internal Medicine

## 2020-09-01 VITALS — BP 142/70 | HR 61 | Ht 64.0 in | Wt 98.0 lb

## 2020-09-01 DIAGNOSIS — D500 Iron deficiency anemia secondary to blood loss (chronic): Secondary | ICD-10-CM | POA: Diagnosis not present

## 2020-09-01 DIAGNOSIS — E441 Mild protein-calorie malnutrition: Secondary | ICD-10-CM

## 2020-09-01 DIAGNOSIS — K558 Other vascular disorders of intestine: Secondary | ICD-10-CM

## 2020-09-01 DIAGNOSIS — I10 Essential (primary) hypertension: Secondary | ICD-10-CM | POA: Diagnosis not present

## 2020-09-01 DIAGNOSIS — E1169 Type 2 diabetes mellitus with other specified complication: Secondary | ICD-10-CM | POA: Diagnosis not present

## 2020-09-01 DIAGNOSIS — E785 Hyperlipidemia, unspecified: Secondary | ICD-10-CM

## 2020-09-01 DIAGNOSIS — K5520 Angiodysplasia of colon without hemorrhage: Secondary | ICD-10-CM

## 2020-09-01 LAB — RENAL FUNCTION PANEL
Albumin: 4.2 g/dL (ref 3.5–5.2)
BUN: 14 mg/dL (ref 6–23)
CO2: 30 mEq/L (ref 19–32)
Calcium: 9.9 mg/dL (ref 8.4–10.5)
Chloride: 104 mEq/L (ref 96–112)
Creatinine, Ser: 0.96 mg/dL (ref 0.40–1.20)
GFR: 56.65 mL/min — ABNORMAL LOW (ref >60.00)
Glucose, Bld: 92 mg/dL (ref 70–99)
Phosphorus: 3.3 mg/dL (ref 2.3–4.6)
Potassium: 4.5 mEq/L (ref 3.5–5.1)
Sodium: 140 mEq/L (ref 135–145)

## 2020-09-01 LAB — CBC
HCT: 36 % (ref 36.0–46.0)
Hemoglobin: 11.5 g/dL — ABNORMAL LOW (ref 12.0–15.0)
MCHC: 31.9 g/dL (ref 30.0–36.0)
MCV: 81.4 fl (ref 78.0–100.0)
Platelets: 360 10*3/uL (ref 150.0–400.0)
RBC: 4.42 Mil/uL (ref 3.87–5.11)
RDW: 18.8 % — ABNORMAL HIGH (ref 11.5–15.5)
WBC: 6.3 10*3/uL (ref 4.0–10.5)

## 2020-09-01 NOTE — Assessment & Plan Note (Signed)
Got 3 transfusions in hospital and IV iron I will check the blood count again today Will forward chart to Dr Burlene Arnt to see if he wants to see her sooner than next month

## 2020-09-01 NOTE — Assessment & Plan Note (Signed)
BP Readings from Last 3 Encounters:  09/01/20 (!) 142/70  08/24/20 (!) 132/56  07/06/20 (!) 146/61   Was taken off lisinopril/HCTZ and put on metoprolol Had tachycardia in hospital but rate in 60's now Will not start the metoprolol--but will restart the lisinopril but just one a day

## 2020-09-01 NOTE — Assessment & Plan Note (Signed)
Recurrent bleeding spells Had coagulation again Will follow up with Dr Lorn Junes appt (considering capsule study?)

## 2020-09-01 NOTE — Assessment & Plan Note (Signed)
Discussed increasing the ensure

## 2020-09-01 NOTE — Progress Notes (Signed)
Subjective:    Patient ID: Kathryn Ware, female    DOB: Feb 18, 1941, 79 y.o.   MRN: [id number]  HPI Here for hospital follow up from another GI bleed Here with sister This visit occurred during the SARS-CoV-2 public health emergency.  Safety protocols were in place, including screening questions prior to the visit, additional usage of staff PPE, and extensive cleaning of exam room while observing appropriate contact time as indicated for disinfecting solutions.   Reviewed hospital record and discharge summary She felt weak and went to ER Hemoglobin down to 6.5--got transfused 2 units at first--then another unit before leaving Got IV iron also EGD by Dr Devonne Doughty plasma coagulation to angiodysplasias in jejunum  Did have periods of tachycardia in hospital Given new Rx for metoprolol---but didn't take it (see phone note) No palpitations No chest pain or SOB Fatigue is better now Is on oral iron  Current Outpatient Medications on File Prior to Visit  Medication Sig Dispense Refill  . Accu-Chek Softclix Lancets lancets     . albuterol (VENTOLIN HFA) 108 (90 Base) MCG/ACT inhaler INHALE 2 PUFFS INTO THE LUNGS EVERY 6 HOURS AS NEEDED FOR WHEEZING OR SHORTNESS OF BREATH (Patient taking differently: Inhale 2 puffs into the lungs every 6 (six) hours as needed. INHALE 2 PUFFS INTO THE LUNGS EVERY 6 HOURS AS NEEDED FOR WHEEZING OR SHORTNESS OF BREATH) 8.5 g 0  . Blood Glucose Monitoring Suppl (ACCU-CHEK GUIDE) w/Device KIT by Does not apply route.    . clonazePAM (KLONOPIN) 0.5 MG tablet TAKE 1 TO 2 TABLETS(0.5 TO 1 MG) BY MOUTH AT BEDTIME AS NEEDED FOR RESTLESS LEGS, 60 tablet 0  . glucose blood (ACCU-CHEK GUIDE) test strip 1 each by Other route as needed for other. Use as instructed    . metoprolol succinate (TOPROL XL) 25 MG 24 hr tablet Take 1 tablet (25 mg total) by mouth at bedtime. (Patient not taking: Reported on 08/26/2020) 30 tablet 0  . nicotine (NICODERM CQ - DOSED IN MG/24 HOURS)  21 mg/24hr patch One patch chest wall daily (okay to substitute generic) (don't use if smoking) 28 patch 0  . omeprazole (PRILOSEC) 40 MG capsule Take 1 capsule (40 mg total) by mouth in the morning and at bedtime. 180 capsule 3  . simvastatin (ZOCOR) 80 MG tablet TAKE 1 TABLET(80 MG) BY MOUTH DAILY 90 tablet 3  . vitamin B-12 (CYANOCOBALAMIN) 1000 MCG tablet Take 1,000 mcg by mouth daily.     No current facility-administered medications on file prior to visit.    Allergies  Allergen Reactions  . Nifedipine     REACTION: cough  . Metformin Other (See Comments)    REACTION: diarrhea when dosage is increased, ok when taking one tablet daily, patient aware that she is on metformin when it is listed that she is allergic    Past Medical History:  Diagnosis Date  . Anemia   . ARF (acute renal failure) ([place])   . Blood transfusion without reported diagnosis   . Diabetes mellitus type II   . GI (gastrointestinal bleed)   . GI AVM (gastrointestinal arteriovenous vascular malformation)    stomach  . History of echocardiogram 12/2013   a. 12/2013: EF 60-65%, DD, mild LVH, nl RV size & systolic function, mild MR/TR, mild AS, nl RVSP; b. TTE 04/2017: EF 60-65%, normal wall motion, GR1DD, mild MR, left atrium normal in size, normal RV systolic function, PASP normal   . History of nuclear stress test  a. 12/2013: low risk, no sig WMA, nondiag EKG 2/2 baseline LVH w/ repol abnl, no sig ischemia, EF 70% no artifact  . HLD (hyperlipidemia)   . HTN (hypertension)   . Hypercholesterolemia   . IDA (iron deficiency anemia)   . Polyp of colon, adenomatous   . RLS (restless legs syndrome)     Past Surgical History:  Procedure Laterality Date  . ABDOMINAL ADHESION SURGERY  11/2001   Dr. Tamala Julian  . ABDOMINAL HYSTERECTOMY  1976   RSO (fibroid)  . COLONOSCOPY WITH PROPOFOL N/A 07/07/2016   Procedure: COLONOSCOPY WITH PROPOFOL;  Surgeon: Manya Silvas, MD;  Location: [facility] ENDOSCOPY;  Service: Endoscopy;   Laterality: N/A;  . COLONOSCOPY WITH PROPOFOL N/A 12/14/2016   Procedure: COLONOSCOPY WITH PROPOFOL;  Surgeon: Manya Silvas, MD;  Location: [facility] ENDOSCOPY;  Service: Endoscopy;  Laterality: N/A;  . DOBUTAMINE STRESS ECHO  11/10   normal  . DOPPLER ECHOCARDIOGRAPHY     EF 55%, mild MR, TR  . ENTEROSCOPY N/A 06/08/2018   Procedure: ENTEROSCOPY;  Surgeon: Lin Landsman, MD;  Location: [facility] ENDOSCOPY;  Service: Gastroenterology;  Laterality: N/A;  . ENTEROSCOPY N/A 01/06/2020   Procedure: ENTEROSCOPY;  Surgeon: Lin Landsman, MD;  Location: [facility] ENDOSCOPY;  Service: Gastroenterology;  Laterality: N/A;  . ENTEROSCOPY N/A 08/22/2020   Procedure: ENTEROSCOPY;  Surgeon: [name], MD;  Location: [facility] ENDOSCOPY;  Service: Gastroenterology;  Laterality: N/A;  . ESOPHAGOGASTRODUODENOSCOPY  07/2006   multiple angioectasias  . ESOPHAGOGASTRODUODENOSCOPY Left 03/12/2015   Procedure: place tube in throat and evaluate stomach and duodenum for source of bleeding. Cauterize if concern for rebleeding.;  Surgeon: Hulen Luster, MD;  Location: [facility] ENDOSCOPY;  Service: Endoscopy;  Laterality: Left;  . ESOPHAGOGASTRODUODENOSCOPY N/A 12/14/2016   Procedure: ESOPHAGOGASTRODUODENOSCOPY (EGD);  Surgeon: Manya Silvas, MD;  Location: [facility] ENDOSCOPY;  Service: Endoscopy;  Laterality: N/A;  . ESOPHAGOGASTRODUODENOSCOPY N/A 07/19/2017   Procedure: ESOPHAGOGASTRODUODENOSCOPY (EGD);  Surgeon: Lin Landsman, MD;  Location: [facility] ENDOSCOPY;  Service: Gastroenterology;  Laterality: N/A;  . ESOPHAGOGASTRODUODENOSCOPY (EGD) WITH PROPOFOL N/A 11/23/2015   Procedure: ESOPHAGOGASTRODUODENOSCOPY (EGD) WITH PROPOFOL;  Surgeon: Hulen Luster, MD;  Location: [facility] ENDOSCOPY;  Service: Gastroenterology;  Laterality: N/A;  . ESOPHAGOGASTRODUODENOSCOPY (EGD) WITH PROPOFOL N/A 07/07/2016   Procedure: ESOPHAGOGASTRODUODENOSCOPY (EGD) WITH PROPOFOL;  Surgeon: Manya Silvas, MD;  Location: [facility] ENDOSCOPY;  Service: Endoscopy;   Laterality: N/A;  . ESOPHAGOGASTRODUODENOSCOPY (EGD) WITH PROPOFOL N/A 05/19/2017   Procedure: ESOPHAGOGASTRODUODENOSCOPY (EGD) WITH PROPOFOL;  Surgeon: Manya Silvas, MD;  Location: [facility] ENDOSCOPY;  Service: Endoscopy;  Laterality: N/A;  . ESOPHAGOGASTRODUODENOSCOPY (EGD) WITH PROPOFOL N/A 06/26/2020   Procedure: ESOPHAGOGASTRODUODENOSCOPY (EGD) WITH PROPOFOL;  Surgeon: Lucilla Lame, MD;  Location: [facility] ENDOSCOPY;  Service: Endoscopy;  Laterality: N/A;  . laryngeal polyp  10/2008   Dr. Tami Ribas  . TOP      Family History  Problem Relation Age of Onset  . Stroke Father   . Cancer Sister        breast cancer  . Hypertension Mother   . Hypertension Other        sibling  . Diabetes Other        grandmother    Social History   Socioeconomic History  . Marital status: Widowed    Spouse name: Not on file  . Number of children: 1  . Years of education: Not on file  . Highest education level: Not on file  Occupational History  . Occupation: Regulatory affairs officer    Comment: retired  Tobacco Use  . Smoking status: Current Every Day Smoker    Packs/day: 0.75    Years: 63.00    Pack years: 47.25    Types: Cigarettes  . Smokeless tobacco: Never Used  Vaping Use  . Vaping Use: Never used  Substance and Sexual Activity  . Alcohol use: No  . Drug use: No  . Sexual activity: Not on file  Other Topics Concern  . Not on file  Social History Narrative   Lives with sister; in [place] since 79 years; 1/2 ppd; no alcohol. Used to work in [place].       No living will   Requests son Orson Gear as health care POA   Would accept resuscitation --but no prolonged machines   Not sure about feeding tubes   Social Determinants of Health   Financial Resource Strain:   . Difficulty of Paying Living Expenses: Not on file  Food Insecurity:   . Worried About Charity fundraiser in the Last Year: Not on file  . Ran Out of Food in the Last Year: Not on file  Transportation Needs:   . Lack of  Transportation (Medical): Not on file  . Lack of Transportation (Non-Medical): Not on file  Physical Activity:   . Days of Exercise per Week: Not on file  . Minutes of Exercise per Session: Not on file  Stress:   . Feeling of Stress : Not on file  Social Connections:   . Frequency of Communication with Friends and Family: Not on file  . Frequency of Social Gatherings with Friends and Family: Not on file  . Attends Religious Services: Not on file  . Active Member of Clubs or Organizations: Not on file  . Attends Archivist Meetings: Not on file  . Marital Status: Not on file  Intimate Partner Violence:   . Fear of Current or Ex-Partner: Not on file  . Emotionally Abused: Not on file  . Physically Abused: Not on file  . Sexually Abused: Not on file   Review of Systems No abdominal pain Not eating great--has lost a little weight Drinking some boost--and some V8 (told her to stick to boost) Wasn't able to stop smoking and not using the nicotine patch    Objective:   Physical Exam Constitutional:      Comments: Mild wasting  Cardiovascular:     Rate and Rhythm: Normal rate and regular rhythm.     Heart sounds: No murmur heard.  No gallop.   Pulmonary:     Effort: Pulmonary effort is normal.     Breath sounds: Normal breath sounds. No wheezing or rales.  Abdominal:     Palpations: Abdomen is soft. There is no mass.     Tenderness: There is no abdominal tenderness. There is no guarding.  Musculoskeletal:     Cervical back: Neck supple.     Right lower leg: No edema.     Left lower leg: No edema.  Lymphadenopathy:     Cervical: No cervical adenopathy.  Neurological:     Mental Status: She is alert.  Psychiatric:        Mood and Affect: Mood normal.        Behavior: Behavior normal.            Assessment & Plan:

## 2020-09-01 NOTE — Assessment & Plan Note (Signed)
Lab Results  Component Value Date   HGBA1C 5.9 (H) 08/22/2020   Not sure why she was taken off the metformin Will restart

## 2020-09-01 NOTE — Patient Instructions (Signed)
Do not take the metoprolol. Restart the lisinopril/HCTZ 20/12.5 but just one a day Resume the metformin 500mg  daily with breakfast

## 2020-09-07 ENCOUNTER — Telehealth: Payer: Self-pay

## 2020-09-07 NOTE — Telephone Encounter (Signed)
Contacted patient to reschedule missed CT screening scan.  I spoke to her sister, Adela Lank Plocher was also on speaker) and we have rescheduled her CT scan for Dec 13 at 10:00am.  Sister confirms she can transport patient and knows where the imaging center is.

## 2020-09-11 ENCOUNTER — Other Ambulatory Visit: Payer: Self-pay | Admitting: Internal Medicine

## 2020-09-11 MED ORDER — CLONAZEPAM 0.5 MG PO TABS
ORAL_TABLET | ORAL | 0 refills | Status: DC
Start: 2020-09-11 — End: 2020-10-12

## 2020-09-11 NOTE — Telephone Encounter (Signed)
Last filled 08-11-20 #60 Last OV 09-01-20 Next OV 12-02-20 Kathryn Ware

## 2020-09-11 NOTE — Telephone Encounter (Signed)
Patient called.  She called her pharmacy, but they said she had to call her doctor to get a refill on Clonazepam.  Patient has 2 pills left.  Patient uses [facility]. Patient would like it refilled before the weekend.

## 2020-10-04 ENCOUNTER — Other Ambulatory Visit: Payer: Self-pay | Admitting: Internal Medicine

## 2020-10-05 ENCOUNTER — Inpatient Hospital Stay: Payer: Medicare HMO | Attending: Nurse Practitioner | Admitting: Nurse Practitioner

## 2020-10-05 ENCOUNTER — Other Ambulatory Visit: Payer: Self-pay

## 2020-10-05 ENCOUNTER — Ambulatory Visit
Admission: RE | Admit: 2020-10-05 | Discharge: 2020-10-05 | Disposition: A | Discharge status: Home / Self Care | Payer: Medicare HMO | Source: Ambulatory Visit | Attending: Nurse Practitioner | Admitting: Nurse Practitioner

## 2020-10-05 ENCOUNTER — Encounter: Payer: Medicare Other | Admitting: Internal Medicine

## 2020-10-05 ENCOUNTER — Encounter: Payer: Self-pay | Admitting: *Deleted

## 2020-10-05 DIAGNOSIS — J439 Emphysema, unspecified: Secondary | ICD-10-CM | POA: Diagnosis not present

## 2020-10-05 DIAGNOSIS — Z87891 Personal history of nicotine dependence: Secondary | ICD-10-CM

## 2020-10-05 DIAGNOSIS — F17210 Nicotine dependence, cigarettes, uncomplicated: Secondary | ICD-10-CM | POA: Insufficient documentation

## 2020-10-05 DIAGNOSIS — Z122 Encounter for screening for malignant neoplasm of respiratory organs: Secondary | ICD-10-CM | POA: Insufficient documentation

## 2020-10-05 DIAGNOSIS — I700 Atherosclerosis of aorta: Secondary | ICD-10-CM | POA: Diagnosis not present

## 2020-10-05 DIAGNOSIS — I2510 Atherosclerotic heart disease of native coronary artery without angina pectoris: Secondary | ICD-10-CM | POA: Diagnosis not present

## 2020-10-05 NOTE — Progress Notes (Signed)
Virtual Visit via Video Enabled Telemedicine Note   I connected with [name] on 10/05/20 at 10:00 AM EST by video enabled telemedicine visit and verified that I am speaking with the correct person using two identifiers.   I discussed the limitations, risks, security and privacy concerns of performing an evaluation and management service by telemedicine and the availability of in-person appointments. I also discussed with the patient that there may be a patient responsible charge related to this service. The patient expressed understanding and agreed to proceed.   Other persons participating in the visit and their role in the encounter: Burgess Estelle, RN- checking in patient & navigation  Patient's location: Kathryn Ware  Provider's location: Clinic  Chief Complaint: Low Dose CT Screening  Patient agreed to evaluation by telemedicine to discuss shared decision making for consideration of low dose CT lung cancer screening.    In accordance with CMS guidelines, patient has met eligibility criteria including age, absence of signs or symptoms of lung cancer.  Social History   Tobacco Use  . Smoking status: Current Every Day Smoker    Packs/day: 0.75    Years: 63.00    Pack years: 47.25    Types: Cigarettes  . Smokeless tobacco: Never Used  Substance Use Topics  . Alcohol use: No     A shared decision-making session was conducted prior to the performance of CT scan. This includes one or more decision aids, includes benefits and harms of screening, follow-up diagnostic testing, over-diagnosis, false positive rate, and total radiation exposure.   Counseling on the importance of adherence to annual lung cancer LDCT screening, impact of co-morbidities, and ability or willingness to undergo diagnosis and treatment is imperative for compliance of the program.   Counseling on the importance of continued smoking cessation for former smokers; the importance of smoking cessation for  current smokers, and information about tobacco cessation interventions have been given to patient including [place] and [employer] [place] programs.   Written order for lung cancer screening with LDCT has been given to the patient and any and all questions have been answered to the best of my abilities.    Yearly follow up will be coordinated by Burgess Estelle, Thoracic Navigator.  I discussed the assessment and treatment plan with the patient. The patient was provided an opportunity to ask questions and all were answered. The patient agreed with the plan and demonstrated an understanding of the instructions.   The patient was advised to call back or seek an in-person evaluation if the symptoms worsen or if the condition fails to improve as anticipated.   I provided 15 minutes of face-to-face video visit time during this encounter, and > 50% was spent counseling as documented under my assessment & plan.   Beckey Rutter, DNP, AGNP-C [facility] [phone] (clinic)

## 2020-10-05 NOTE — Progress Notes (Signed)
Noted lung screening results. Will notify PCP, obtain Pulmonology recommendation and contact patient.

## 2020-10-06 ENCOUNTER — Telehealth: Payer: Self-pay | Admitting: *Deleted

## 2020-10-06 NOTE — Telephone Encounter (Signed)
After notifying PCP and obtaining pulmonary opinion for PET and pulmonary follow up, patient notified of results noted below. Patient is agreeable with recommended plan. Currently awaiting authorization for PET scan and will schedule as soon as possible.   IMPRESSION: 1. Lung-RADS 4B, suspicious. Spiculated peripheral apical left upper lobe pulmonary nodule measuring 20.7 mm in volume derived mean diameter, highly suspicious for primary bronchogenic carcinoma. Additional scattered smaller contralateral pulmonary nodules. Additional imaging evaluation or consultation with Pulmonology or Thoracic Surgery recommended. PET-CT suggested for characterization. 2. Three-vessel coronary atherosclerosis. 3. Asymmetric severe right renal atrophy. 4. Aortic Atherosclerosis (ICD10-I70.0) and Emphysema (ICD10-J43.9).

## 2020-10-06 NOTE — Progress Notes (Signed)
Recommend PET/CT and referral to pulmonology.  Renold Don, MD [facility]

## 2020-10-07 NOTE — Telephone Encounter (Signed)
Noted  

## 2020-10-08 ENCOUNTER — Other Ambulatory Visit: Payer: Self-pay | Admitting: *Deleted

## 2020-10-08 DIAGNOSIS — R911 Solitary pulmonary nodule: Secondary | ICD-10-CM

## 2020-10-08 NOTE — Telephone Encounter (Signed)
Patient's sister is given appts for PET scan on 10/29/20 12 noon arrival and she has been contacted by pulmonology with a consultation appt. after the PET scan.

## 2020-10-12 ENCOUNTER — Other Ambulatory Visit: Payer: Self-pay

## 2020-10-12 ENCOUNTER — Other Ambulatory Visit: Payer: Self-pay | Admitting: Internal Medicine

## 2020-10-12 MED ORDER — CLONAZEPAM 0.5 MG PO TABS
ORAL_TABLET | ORAL | 0 refills | Status: DC
Start: 2020-10-12 — End: 2020-11-11

## 2020-10-12 NOTE — Telephone Encounter (Signed)
I spoke with pt; pt request clonazepam for restless leg. Pt is out of med.  Name of Medication: clonazepam 0.5 mg Name of Pharmacy: [facility] Last Fill or Written Date and Quantity: # 60 on 09/11/20 Last Office Visit and Type:09/01/20 HFU  Next Office Visit and Type: 12/02/2020 FOR 3 mth FU

## 2020-10-12 NOTE — Telephone Encounter (Signed)
[place] Night - Client Nonclinical Telephone Record AccessNurse Client [place] Night - Client Client Site [facility] Physician Viviana Simpler- MD Contact Type Call Who Is Calling Patient / Member / Family / Caregiver Caller Name [place] Phone Number [phone] Patient Name Kathryn Ware Patient DOB 12-19-1940 Call Type Message Only Information Provided Reason for Call Medication Question / Request Initial Comment Caller states she would like the office call her. Disp. Time Disposition Final User 10/12/2020 8:02:00 AM General Information Provided Yes Baruch Goldmann Call Closed By: Baruch Goldmann Transaction Date/Time: 10/12/2020 7:59:16 AM (ET)

## 2020-10-13 ENCOUNTER — Ambulatory Visit: Payer: Medicare HMO | Admitting: Gastroenterology

## 2020-10-27 ENCOUNTER — Telehealth: Payer: Self-pay

## 2020-10-27 NOTE — Telephone Encounter (Signed)
Pt left v/m that she wanted DR Silvio Pate to know that she no longer has [employer] and pt does have [facility]. It is already noted in pts chart that she has [employer]. Pt said she does not need anything now she just wanted to make sure the [place] was in pts chart; sending note to [facility] [employer] front office mgr to verify. Thank you.

## 2020-11-02 NOTE — Telephone Encounter (Signed)
This has been verified that pt's [facility] is reflected.

## 2020-11-09 ENCOUNTER — Inpatient Hospital Stay: Payer: Medicare Other | Admitting: Internal Medicine

## 2020-11-09 ENCOUNTER — Inpatient Hospital Stay: Payer: Medicare Other

## 2020-11-10 ENCOUNTER — Institutional Professional Consult (permissible substitution): Payer: Medicare HMO | Admitting: Pulmonary Disease

## 2020-11-11 ENCOUNTER — Other Ambulatory Visit: Payer: Self-pay | Admitting: Internal Medicine

## 2020-11-11 NOTE — Telephone Encounter (Signed)
Name of Medication: clonazepam 0.5 mg Name of Pharmacy: [facility] Last Fill or Written Date and Quantity:# 57 on 10/12/2020  Last Office Visit and Type: 09/01/2020 HFU Next Office Visit and Type: 12/02/20 for 3 mth FU  Pt left v/m requesting to be able to pick up clonazepam on 11/12/20 before snow comes.

## 2020-11-12 ENCOUNTER — Other Ambulatory Visit: Payer: Self-pay

## 2020-11-12 ENCOUNTER — Ambulatory Visit
Admission: RE | Admit: 2020-11-12 | Discharge: 2020-11-12 | Disposition: A | Discharge status: Home / Self Care | Payer: Medicare Other | Source: Ambulatory Visit | Attending: Nurse Practitioner | Admitting: Nurse Practitioner

## 2020-11-12 DIAGNOSIS — I700 Atherosclerosis of aorta: Secondary | ICD-10-CM | POA: Insufficient documentation

## 2020-11-12 DIAGNOSIS — I2510 Atherosclerotic heart disease of native coronary artery without angina pectoris: Secondary | ICD-10-CM | POA: Diagnosis not present

## 2020-11-12 DIAGNOSIS — J439 Emphysema, unspecified: Secondary | ICD-10-CM | POA: Insufficient documentation

## 2020-11-12 DIAGNOSIS — R911 Solitary pulmonary nodule: Secondary | ICD-10-CM

## 2020-11-12 LAB — GLUCOSE, CAPILLARY: Glucose-Capillary: 84 mg/dL (ref 70–99)

## 2020-11-12 MED ORDER — FLUDEOXYGLUCOSE F - 18 (FDG) INJECTION
5.6800 | Freq: Once | INTRAVENOUS | Status: AC | PRN
  Administered 2020-11-12: 5.68 via INTRAVENOUS

## 2020-11-13 ENCOUNTER — Telehealth: Payer: Self-pay | Admitting: Pulmonary Disease

## 2020-11-13 NOTE — Telephone Encounter (Signed)
Patient's sister, Sheila(DPR) called to confirm appt for 11/18/2020. Nothing further needed at this time.

## 2020-11-18 ENCOUNTER — Ambulatory Visit (INDEPENDENT_AMBULATORY_CARE_PROVIDER_SITE_OTHER): Payer: Medicare Other | Admitting: Pulmonary Disease

## 2020-11-18 ENCOUNTER — Encounter: Payer: Self-pay | Admitting: Pulmonary Disease

## 2020-11-18 ENCOUNTER — Other Ambulatory Visit: Payer: Self-pay

## 2020-11-18 ENCOUNTER — Telehealth: Payer: Self-pay | Admitting: Internal Medicine

## 2020-11-18 ENCOUNTER — Telehealth: Payer: Self-pay | Admitting: Pulmonary Disease

## 2020-11-18 VITALS — BP 142/62 | HR 78 | Temp 97.7°F | Ht 66.0 in | Wt 96.6 lb

## 2020-11-18 DIAGNOSIS — J449 Chronic obstructive pulmonary disease, unspecified: Secondary | ICD-10-CM

## 2020-11-18 DIAGNOSIS — R911 Solitary pulmonary nodule: Secondary | ICD-10-CM

## 2020-11-18 DIAGNOSIS — R918 Other nonspecific abnormal finding of lung field: Secondary | ICD-10-CM | POA: Diagnosis not present

## 2020-11-18 DIAGNOSIS — F17210 Nicotine dependence, cigarettes, uncomplicated: Secondary | ICD-10-CM | POA: Diagnosis not present

## 2020-11-18 NOTE — Patient Instructions (Signed)
We will get a scan of your chest that is more detailed to be able to do your procedure.  Your procedure has been scheduled for 9 February at 12:45 PM.  We will see you in follow-up in 3 to 4 weeks time call sooner should any new problems arise.  I will be in touch with your doctor at the cancer center.

## 2020-11-18 NOTE — Telephone Encounter (Signed)
Prior Josem Kaufmann is not required for the codes 31627, [phone] Refer # [id number]

## 2020-11-18 NOTE — Telephone Encounter (Signed)
Phone pre admit visit 11/23/2020 between 8-1 and covid test 11/30/2020. Both patient and patient's sister, Sheila(DPR) are aware of dates/times. Nothing further needed.

## 2020-11-18 NOTE — H&P (View-Only) (Signed)
Subjective:    Patient ID: Kathryn Ware, female    DOB: 10-Jul-1941, 80 y.o.   MRN: [id number]  HPI Kathryn Ware is a 80 year old current smoker (half PPD, 63 pack years) who presents for evaluation of a left upper lobe mass found incidentally on lung cancer screening CT.  She is kindly referred by Burgess Estelle, RN oncology navigator and by Dr. Viviana Simpler, patient's primary care physician.  The patient is asymptomatic with regards to her left upper lobe mass.  She was actually unsure as to why she was here today to see [place].  We have reviewed her imaging and potential management options for this lesion.  The patient desires to proceed with biopsy to determine potential treatment options.  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  She is to see Dr. Rogue Bussing for iron deficiency and iron infusions.  She does not endorse shortness of breath, cough or hemoptysis.  She does not endorse any chest pain, paroxysmal nocturnal dyspnea orthopnea.  No lower extremity edema.  Though she has an albuterol inhaler to use as needed she never sees the need for this.  She has been a longtime smoker and recently cut down on her cigarette use to half a pack of cigarettes per day.  She voices no other complaints.  She has an appointment with Dr. Rogue Bussing on 7 February.   Review of Systems A 10 point review of systems was performed and it is as noted above otherwise negative.  Past Medical History:  Diagnosis Date  . Anemia   . ARF (acute renal failure) ([place])   . Blood transfusion without reported diagnosis   . Diabetes mellitus type II   . GI (gastrointestinal bleed)   . GI AVM (gastrointestinal arteriovenous vascular malformation)    stomach  . History of echocardiogram 12/2013   a. 12/2013: EF 60-65%, DD, mild LVH, nl RV size & systolic function, mild MR/TR, mild AS, nl RVSP; b. TTE 04/2017: EF 60-65%, normal wall motion, GR1DD, mild MR, left atrium normal in size, normal RV  systolic function, PASP normal   . History of nuclear stress test    a. 12/2013: low risk, no sig WMA, nondiag EKG 2/2 baseline LVH w/ repol abnl, no sig ischemia, EF 70% no artifact  . HLD (hyperlipidemia)   . HTN (hypertension)   . Hypercholesterolemia   . IDA (iron deficiency anemia)   . Polyp of colon, adenomatous   . RLS (restless legs syndrome)    Past Surgical History:  Procedure Laterality Date  . ABDOMINAL ADHESION SURGERY  11/2001   Dr. Tamala Julian  . ABDOMINAL HYSTERECTOMY  1976   RSO (fibroid)  . COLONOSCOPY WITH PROPOFOL N/A 07/07/2016   Procedure: COLONOSCOPY WITH PROPOFOL;  Surgeon: Manya Silvas, MD;  Location: [facility] ENDOSCOPY;  Service: Endoscopy;  Laterality: N/A;  . COLONOSCOPY WITH PROPOFOL N/A 12/14/2016   Procedure: COLONOSCOPY WITH PROPOFOL;  Surgeon: Manya Silvas, MD;  Location: [facility] ENDOSCOPY;  Service: Endoscopy;  Laterality: N/A;  . DOBUTAMINE STRESS ECHO  11/10   normal  . DOPPLER ECHOCARDIOGRAPHY     EF 55%, mild MR, TR  . ENTEROSCOPY N/A 06/08/2018   Procedure: ENTEROSCOPY;  Surgeon: Lin Landsman, MD;  Location: [facility] ENDOSCOPY;  Service: Gastroenterology;  Laterality: N/A;  . ENTEROSCOPY N/A 01/06/2020   Procedure: ENTEROSCOPY;  Surgeon: Lin Landsman, MD;  Location: [facility] ENDOSCOPY;  Service: Gastroenterology;  Laterality: N/A;  . ENTEROSCOPY N/A 08/22/2020  Procedure: ENTEROSCOPY;  Surgeon: [name], MD;  Location: [facility] ENDOSCOPY;  Service: Gastroenterology;  Laterality: N/A;  . ESOPHAGOGASTRODUODENOSCOPY  07/2006   multiple angioectasias  . ESOPHAGOGASTRODUODENOSCOPY Left 03/12/2015   Procedure: place tube in throat and evaluate stomach and duodenum for source of bleeding. Cauterize if concern for rebleeding.;  Surgeon: Hulen Luster, MD;  Location: [facility] ENDOSCOPY;  Service: Endoscopy;  Laterality: Left;  . ESOPHAGOGASTRODUODENOSCOPY N/A 12/14/2016   Procedure: ESOPHAGOGASTRODUODENOSCOPY (EGD);  Surgeon: Manya Silvas, MD;  Location: [facility]  ENDOSCOPY;  Service: Endoscopy;  Laterality: N/A;  . ESOPHAGOGASTRODUODENOSCOPY N/A 07/19/2017   Procedure: ESOPHAGOGASTRODUODENOSCOPY (EGD);  Surgeon: Lin Landsman, MD;  Location: [facility] ENDOSCOPY;  Service: Gastroenterology;  Laterality: N/A;  . ESOPHAGOGASTRODUODENOSCOPY (EGD) WITH PROPOFOL N/A 11/23/2015   Procedure: ESOPHAGOGASTRODUODENOSCOPY (EGD) WITH PROPOFOL;  Surgeon: Hulen Luster, MD;  Location: [facility] ENDOSCOPY;  Service: Gastroenterology;  Laterality: N/A;  . ESOPHAGOGASTRODUODENOSCOPY (EGD) WITH PROPOFOL N/A 07/07/2016   Procedure: ESOPHAGOGASTRODUODENOSCOPY (EGD) WITH PROPOFOL;  Surgeon: Manya Silvas, MD;  Location: [facility] ENDOSCOPY;  Service: Endoscopy;  Laterality: N/A;  . ESOPHAGOGASTRODUODENOSCOPY (EGD) WITH PROPOFOL N/A 05/19/2017   Procedure: ESOPHAGOGASTRODUODENOSCOPY (EGD) WITH PROPOFOL;  Surgeon: Manya Silvas, MD;  Location: [facility] ENDOSCOPY;  Service: Endoscopy;  Laterality: N/A;  . ESOPHAGOGASTRODUODENOSCOPY (EGD) WITH PROPOFOL N/A 06/26/2020   Procedure: ESOPHAGOGASTRODUODENOSCOPY (EGD) WITH PROPOFOL;  Surgeon: Lucilla Lame, MD;  Location: [facility] ENDOSCOPY;  Service: Endoscopy;  Laterality: N/A;  . laryngeal polyp  10/2008   Dr. Tami Ribas  . TOP     Family History  Problem Relation Age of Onset  . Stroke Father   . Cancer Sister        breast cancer  . Hypertension Mother   . Hypertension Other        sibling  . Diabetes Other        grandmother   Social History   Tobacco Use  . Smoking status: Current Every Day Smoker    Packs/day: 1.00    Years: 63.00    Pack years: 63.00    Types: Cigarettes  . Smokeless tobacco: Never Used  . Tobacco comment: 0.5PPD 11/18/2020  Substance Use Topics  . Alcohol use: No   Allergies  Allergen Reactions  . Nifedipine     REACTION: cough  . Metformin Other (See Comments)    REACTION: diarrhea when dosage is increased, ok when taking one tablet daily, patient aware that she is on metformin when it is listed that she is  allergic   Current Meds  Medication Sig  . Accu-Chek Softclix Lancets lancets   . albuterol (VENTOLIN HFA) 108 (90 Base) MCG/ACT inhaler INHALE 2 PUFFS INTO THE LUNGS EVERY 6 HOURS AS NEEDED FOR WHEEZING OR SHORTNESS OF BREATH (Patient taking differently: Inhale 2 puffs into the lungs every 6 (six) hours as needed. INHALE 2 PUFFS INTO THE LUNGS EVERY 6 HOURS AS NEEDED FOR WHEEZING OR SHORTNESS OF BREATH)  . Blood Glucose Monitoring Suppl (ACCU-CHEK GUIDE) w/Device KIT by Does not apply route.  . clonazePAM (KLONOPIN) 0.5 MG tablet TAKE 1 TO 2 TABLETS(0.5 TO 1 MG) BY MOUTH AT BEDTIME AS NEEDED FOR RESTLESS LEGS (Patient taking differently: Take 1 mg by mouth at bedtime.)  . glucose blood (ACCU-CHEK GUIDE) test strip 1 each by Other route as needed for other. Use as instructed  . lisinopril-hydrochlorothiazide (ZESTORETIC) 20-12.5 MG tablet Take 1 tablet by mouth daily.  . metFORMIN (GLUCOPHAGE-XR) 500 MG 24 hr tablet Take 500 mg by mouth daily with breakfast.  .  omeprazole (PRILOSEC) 40 MG capsule Take 1 capsule (40 mg total) by mouth in the morning and at bedtime.  . simvastatin (ZOCOR) 80 MG tablet TAKE 1 TABLET(80 MG) BY MOUTH DAILY (Patient taking differently: Take 80 mg by mouth daily. TAKE 1 TABLET(80 MG) BY MOUTH DAILY)  . vitamin B-12 (CYANOCOBALAMIN) 1000 MCG tablet Take 1,000 mcg by mouth daily.   Immunization History  Administered Date(s) Administered  . Influenza Inj Mdck Quad Pf 08/02/2019  . Influenza Split 08/17/2011, 08/02/2013, 07/24/2014  . Influenza Whole 08/13/2009, 08/11/2010  . Influenza, High Dose Seasonal PF 08/10/2015  . Influenza,inj,Quad PF,6+ Mos 07/20/2016  . Influenza-Unspecified 07/09/2014, 08/11/2017, 07/24/2018, 07/25/2020  . PFIZER(Purple Top)SARS-COV-2 Vaccination 12/13/2019, 01/03/2020, 07/23/2020  . Pneumococcal Conjugate-13 02/18/2015  . Pneumococcal Polysaccharide-23 01/24/2008, 05/31/2017  . Td 05/17/2006      Objective:   Physical Exam BP (!)  142/62 (BP Location: Left Arm, Cuff Size: Normal)   Pulse 78   Temp 97.7 F (36.5 C) (Temporal)   Ht '5\' 6"'  (1.676 m)   Wt 96 lb 9.6 oz (43.8 kg)   LMP  (LMP Unknown)   SpO2 98%   BMI 15.59 kg/m  GENERAL: Thin, well-developed woman, no acute distress.  No conversational dyspnea. Fully ambulatory. HEAD: Normocephalic, atraumatic.  EYES: Pupils equal, round, reactive to light.  No scleral icterus.  MOUTH: Nose/mouth/throat not examined due to masking requirements for COVID 19.   NECK: Supple. No thyromegaly. Trachea midline. No JVD.  No adenopathy. PULMONARY: Good air entry bilaterally.  No adventitious sounds. CARDIOVASCULAR: S1 and S2. Regular rate and rhythm.  ABDOMEN: Scaphoid, otherwise benign. MUSCULOSKELETAL: No joint deformity, no clubbing, no edema.  NEUROLOGIC: No overt focal deficit speech is fluent.  No gait disturbance. SKIN: Intact,warm,dry.  On limited exam no rashes. PSYCH: Behavior normal.   Representative image of low-dose chest CT performed 05 October 2020, independently reviewed: Mass noted on the left upper lobe.     PET/CT image from 13 November 2020, independently reviewed: Intense FDG avidity left upper lobe mass.    Assessment & Plan:     ICD-10-CM   1. Mass of upper lobe of left lung  R91.8 CT Super D Chest Wo Contrast   Carcinoma until proven otherwise Will need biopsy Best approach would be with navigational bronchoscopy Scheduled for 9 February Will need super D chest CT  2. COPD suggested by initial evaluation ([place])  J44.9    Evidence of emphysema on imaging Patient asymptomatic Continue as needed albuterol PFTs at later date  3. Tobacco dependence due to cigarettes  F17.210    Patient was counseled regards to discontinuation of smoking Total counseling time 3 to 5 minutes   Orders Placed This Encounter  Procedures  . CT Super D Chest Wo Contrast    Standing Status:   Future    Standing Expiration Date:   11/18/2021    Scheduling  Instructions:     Within 1 week    Order Specific Question:   Preferred imaging location?    Answer:   [facility]   Discussion:  Patient has a left upper lobe mass incidentally found during lung cancer screening imaging.  The lesion noted is intensely avid by PET/CT.  She will need biopsy of this lesion.  The various forms of biopsy were discussed with the patient and potential benefits, limitations and complications of methods available were discussed with the patient.  She wants to proceed with navigational bronchoscopy.  She will need superDimension CT for mapping purposes.  Benefits, limitations and potential complications of the procedure were discussed with the patient/family  including, but not limited to bleeding, hemoptysis, respiratory failure requiring intubation and/or prolongued mechanical ventilation, infection, pneumothorax (collapse of lung) requiring chest tube placement, stroke or even death.  Patient agrees to proceed.  Procedure has been scheduled for 9 February at 12:45 PM.   Renold Don, MD [facility] [facility]   *This note was dictated using voice recognition software/Dragon.  Despite best efforts to proofread, errors can occur which can change the meaning.  Any change was purely unintentional.

## 2020-11-18 NOTE — Telephone Encounter (Signed)
Patient has been scheduled for bronchoscopy with navigation and cellvizio on 12/02/2020.  EM:[id number] nodule BEE:[id number]  Rodena Piety, please see bronch info. Thanks

## 2020-11-18 NOTE — Progress Notes (Signed)
Subjective:    Patient ID: Kathryn Ware, female    DOB: [birth year], 80 y.o.   MRN: [id number]  HPI J[place] is a 80 year old current smoker (half PPD, 63 pack years) who presents for evaluation of a left upper lobe mass found incidentally on lung cancer screening CT.  She is kindly referred by Burgess Estelle, RN oncology navigator and by Dr. Viviana Simpler, patient's primary care physician.  The patient is asymptomatic with regards to her left upper lobe mass.  She was actually unsure as to why she was here today to see [place].  We have reviewed her imaging and potential management options for this lesion.  The patient desires to proceed with biopsy to determine potential treatment options.  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  She is to see Dr. Rogue Bussing for iron deficiency and iron infusions.  She does not endorse shortness of breath, cough or hemoptysis.  She does not endorse any chest pain, paroxysmal nocturnal dyspnea orthopnea.  No lower extremity edema.  Though she has an albuterol inhaler to use as needed she never sees the need for this.  She has been a longtime smoker and recently cut down on her cigarette use to half a pack of cigarettes per day.  She voices no other complaints.  She has an appointment with Dr. Rogue Bussing on 7 February.   Review of Systems A 10 point review of systems was performed and it is as noted above otherwise negative.  Past Medical History:  Diagnosis Date  . Anemia   . ARF (acute renal failure) ([place])   . Blood transfusion without reported diagnosis   . Diabetes mellitus type II   . GI (gastrointestinal bleed)   . GI AVM (gastrointestinal arteriovenous vascular malformation)    stomach  . History of echocardiogram 12/2013   a. 12/2013: EF 60-65%, DD, mild LVH, nl RV size & systolic function, mild MR/TR, mild AS, nl RVSP; b. TTE 04/2017: EF 60-65%, normal wall motion, GR1DD, mild MR, left atrium normal in size, normal RV  systolic function, PASP normal   . History of nuclear stress test    a. 12/2013: low risk, no sig WMA, nondiag EKG 2/2 baseline LVH w/ repol abnl, no sig ischemia, EF 70% no artifact  . HLD (hyperlipidemia)   . HTN (hypertension)   . Hypercholesterolemia   . IDA (iron deficiency anemia)   . Polyp of colon, adenomatous   . RLS (restless legs syndrome)    Past Surgical History:  Procedure Laterality Date  . ABDOMINAL ADHESION SURGERY  11/2001   Dr. Tamala Julian  . ABDOMINAL HYSTERECTOMY  1976   RSO (fibroid)  . COLONOSCOPY WITH PROPOFOL N/A 07/07/2016   Procedure: COLONOSCOPY WITH PROPOFOL;  Surgeon: Manya Silvas, MD;  Location: [facility] ENDOSCOPY;  Service: Endoscopy;  Laterality: N/A;  . COLONOSCOPY WITH PROPOFOL N/A 12/14/2016   Procedure: COLONOSCOPY WITH PROPOFOL;  Surgeon: Manya Silvas, MD;  Location: [facility] ENDOSCOPY;  Service: Endoscopy;  Laterality: N/A;  . DOBUTAMINE STRESS ECHO  11/10   normal  . DOPPLER ECHOCARDIOGRAPHY     EF 55%, mild MR, TR  . ENTEROSCOPY N/A 06/08/2018   Procedure: ENTEROSCOPY;  Surgeon: Lin Landsman, MD;  Location: [facility] ENDOSCOPY;  Service: Gastroenterology;  Laterality: N/A;  . ENTEROSCOPY N/A 01/06/2020   Procedure: ENTEROSCOPY;  Surgeon: Lin Landsman, MD;  Location: [facility] ENDOSCOPY;  Service: Gastroenterology;  Laterality: N/A;  . ENTEROSCOPY N/A 08/22/2020  Procedure: ENTEROSCOPY;  Surgeon: [name], MD;  Location: [facility] ENDOSCOPY;  Service: Gastroenterology;  Laterality: N/A;  . ESOPHAGOGASTRODUODENOSCOPY  07/2006   multiple angioectasias  . ESOPHAGOGASTRODUODENOSCOPY Left 03/12/2015   Procedure: place tube in throat and evaluate stomach and duodenum for source of bleeding. Cauterize if concern for rebleeding.;  Surgeon: Hulen Luster, MD;  Location: [facility] ENDOSCOPY;  Service: Endoscopy;  Laterality: Left;  . ESOPHAGOGASTRODUODENOSCOPY N/A 12/14/2016   Procedure: ESOPHAGOGASTRODUODENOSCOPY (EGD);  Surgeon: Manya Silvas, MD;  Location: [facility]  ENDOSCOPY;  Service: Endoscopy;  Laterality: N/A;  . ESOPHAGOGASTRODUODENOSCOPY N/A 07/19/2017   Procedure: ESOPHAGOGASTRODUODENOSCOPY (EGD);  Surgeon: Lin Landsman, MD;  Location: [facility] ENDOSCOPY;  Service: Gastroenterology;  Laterality: N/A;  . ESOPHAGOGASTRODUODENOSCOPY (EGD) WITH PROPOFOL N/A 11/23/2015   Procedure: ESOPHAGOGASTRODUODENOSCOPY (EGD) WITH PROPOFOL;  Surgeon: Hulen Luster, MD;  Location: [facility] ENDOSCOPY;  Service: Gastroenterology;  Laterality: N/A;  . ESOPHAGOGASTRODUODENOSCOPY (EGD) WITH PROPOFOL N/A 07/07/2016   Procedure: ESOPHAGOGASTRODUODENOSCOPY (EGD) WITH PROPOFOL;  Surgeon: Manya Silvas, MD;  Location: [facility] ENDOSCOPY;  Service: Endoscopy;  Laterality: N/A;  . ESOPHAGOGASTRODUODENOSCOPY (EGD) WITH PROPOFOL N/A 05/19/2017   Procedure: ESOPHAGOGASTRODUODENOSCOPY (EGD) WITH PROPOFOL;  Surgeon: Manya Silvas, MD;  Location: [facility] ENDOSCOPY;  Service: Endoscopy;  Laterality: N/A;  . ESOPHAGOGASTRODUODENOSCOPY (EGD) WITH PROPOFOL N/A 06/26/2020   Procedure: ESOPHAGOGASTRODUODENOSCOPY (EGD) WITH PROPOFOL;  Surgeon: Lucilla Lame, MD;  Location: [facility] ENDOSCOPY;  Service: Endoscopy;  Laterality: N/A;  . laryngeal polyp  10/2008   Dr. Tami Ribas  . TOP     Family History  Problem Relation Age of Onset  . Stroke Father   . Cancer Sister        breast cancer  . Hypertension Mother   . Hypertension Other        sibling  . Diabetes Other        grandmother   Social History   Tobacco Use  . Smoking status: Current Every Day Smoker    Packs/day: 1.00    Years: 63.00    Pack years: 63.00    Types: Cigarettes  . Smokeless tobacco: Never Used  . Tobacco comment: 0.5PPD 11/18/2020  Substance Use Topics  . Alcohol use: No   Allergies  Allergen Reactions  . Nifedipine     REACTION: cough  . Metformin Other (See Comments)    REACTION: diarrhea when dosage is increased, ok when taking one tablet daily, patient aware that she is on metformin when it is listed that she is  allergic   Current Meds  Medication Sig  . Accu-Chek Softclix Lancets lancets   . albuterol (VENTOLIN HFA) 108 (90 Base) MCG/ACT inhaler INHALE 2 PUFFS INTO THE LUNGS EVERY 6 HOURS AS NEEDED FOR WHEEZING OR SHORTNESS OF BREATH (Patient taking differently: Inhale 2 puffs into the lungs every 6 (six) hours as needed. INHALE 2 PUFFS INTO THE LUNGS EVERY 6 HOURS AS NEEDED FOR WHEEZING OR SHORTNESS OF BREATH)  . Blood Glucose Monitoring Suppl (ACCU-CHEK GUIDE) w/Device KIT by Does not apply route.  . clonazePAM (KLONOPIN) 0.5 MG tablet TAKE 1 TO 2 TABLETS(0.5 TO 1 MG) BY MOUTH AT BEDTIME AS NEEDED FOR RESTLESS LEGS (Patient taking differently: Take 1 mg by mouth at bedtime.)  . glucose blood (ACCU-CHEK GUIDE) test strip 1 each by Other route as needed for other. Use as instructed  . lisinopril-hydrochlorothiazide (ZESTORETIC) 20-12.5 MG tablet Take 1 tablet by mouth daily.  . metFORMIN (GLUCOPHAGE-XR) 500 MG 24 hr tablet Take 500 mg by mouth daily with breakfast.  .  omeprazole (PRILOSEC) 40 MG capsule Take 1 capsule (40 mg total) by mouth in the morning and at bedtime.  . simvastatin (ZOCOR) 80 MG tablet TAKE 1 TABLET(80 MG) BY MOUTH DAILY (Patient taking differently: Take 80 mg by mouth daily. TAKE 1 TABLET(80 MG) BY MOUTH DAILY)  . vitamin B-12 (CYANOCOBALAMIN) 1000 MCG tablet Take 1,000 mcg by mouth daily.   Immunization History  Administered Date(s) Administered  . Influenza Inj Mdck Quad Pf 08/02/2019  . Influenza Split 08/17/2011, 08/02/2013, 07/24/2014  . Influenza Whole 08/13/2009, 08/11/2010  . Influenza, High Dose Seasonal PF 08/10/2015  . Influenza,inj,Quad PF,6+ Mos 07/20/2016  . Influenza-Unspecified 07/09/2014, 08/11/2017, 07/24/2018, 07/25/2020  . PFIZER(Purple Top)SARS-COV-2 Vaccination 12/13/2019, 01/03/2020, 07/23/2020  . Pneumococcal Conjugate-13 02/18/2015  . Pneumococcal Polysaccharide-23 01/24/2008, 05/31/2017  . Td 05/17/2006      Objective:   Physical Exam BP (!)  142/62 (BP Location: Left Arm, Cuff Size: Normal)   Pulse 78   Temp 97.7 F (36.5 C) (Temporal)   Ht '5\' 6"'  (1.676 m)   Wt 96 lb 9.6 oz (43.8 kg)   LMP  (LMP Unknown)   SpO2 98%   BMI 15.59 kg/m  GENERAL: Thin, well-developed woman, no acute distress.  No conversational dyspnea. Fully ambulatory. HEAD: Normocephalic, atraumatic.  EYES: Pupils equal, round, reactive to light.  No scleral icterus.  MOUTH: Nose/mouth/throat not examined due to masking requirements for COVID 19.   NECK: Supple. No thyromegaly. Trachea midline. No JVD.  No adenopathy. PULMONARY: Good air entry bilaterally.  No adventitious sounds. CARDIOVASCULAR: S1 and S2. Regular rate and rhythm.  ABDOMEN: Scaphoid, otherwise benign. MUSCULOSKELETAL: No joint deformity, no clubbing, no edema.  NEUROLOGIC: No overt focal deficit speech is fluent.  No gait disturbance. SKIN: Intact,warm,dry.  On limited exam no rashes. PSYCH: Behavior normal.   Representative image of low-dose chest CT performed 05 October 2020, independently reviewed: Mass noted on the left upper lobe.     PET/CT image from 13 November 2020, independently reviewed: Intense FDG avidity left upper lobe mass.    Assessment & Plan:     ICD-10-CM   1. Mass of upper lobe of left lung  R91.8 CT Super D Chest Wo Contrast   Carcinoma until proven otherwise Will need biopsy Best approach would be with navigational bronchoscopy Scheduled for 9 February Will need super D chest CT  2. COPD suggested by initial evaluation ([place])  J44.9    Evidence of emphysema on imaging Patient asymptomatic Continue as needed albuterol PFTs at later date  3. Tobacco dependence due to cigarettes  F17.210    Patient was counseled regards to discontinuation of smoking Total counseling time 3 to 5 minutes   Orders Placed This Encounter  Procedures  . CT Super D Chest Wo Contrast    Standing Status:   Future    Standing Expiration Date:   11/18/2021    Scheduling  Instructions:     Within 1 week    Order Specific Question:   Preferred imaging location?    Answer:   [facility]   Discussion:  Patient has a left upper lobe mass incidentally found during lung cancer screening imaging.  The lesion noted is intensely avid by PET/CT.  She will need biopsy of this lesion.  The various forms of biopsy were discussed with the patient and potential benefits, limitations and complications of methods available were discussed with the patient.  She wants to proceed with navigational bronchoscopy.  She will need superDimension CT for mapping purposes.  Benefits, limitations and potential complications of the procedure were discussed with the patient/family  including, but not limited to bleeding, hemoptysis, respiratory failure requiring intubation and/or prolongued mechanical ventilation, infection, pneumothorax (collapse of lung) requiring chest tube placement, stroke or even death.  Patient agrees to proceed.  Procedure has been scheduled for 9 February at 12:45 PM.   Renold Don, MD [place] [facility]   *This note was dictated using voice recognition software/Dragon.  Despite best efforts to proofread, errors can occur which can change the meaning.  Any change was purely unintentional.

## 2020-11-18 NOTE — Telephone Encounter (Signed)
Spoke with pt to make her aware of appt. schedule change from 11/25/20 to 11/30/20. Pt confirmed that change was acceptable. Sending AVS via mail.

## 2020-11-19 ENCOUNTER — Encounter: Payer: Self-pay | Admitting: Pulmonary Disease

## 2020-11-19 ENCOUNTER — Telehealth: Payer: Self-pay

## 2020-11-19 NOTE — Telephone Encounter (Signed)
Pt left v/m requesting refill of one touch ultra test strips to [facility]. Pt request cb when done.

## 2020-11-19 NOTE — Telephone Encounter (Signed)
Spoke to pt. She said she has changed insurance. I advised her to find out what brand her insurance covers and let me know and we will have to send in all new meter and supplies. The OneTouch she has no longer has strips being made for it.

## 2020-11-20 NOTE — Telephone Encounter (Signed)
Pt called and said that one touch test strips are no longer being made. I asked pt if she spoke with ins co and she said yes that is who told her they are no longer making one touch test strips. Pt thinks that [facility] CMA will contact ins co to see what meter and test strips are covered by her ins co and then call those in for pt. I advised pt of note from 11/19/20 and she said she new that the one touch test strips were no longer made but does not know what her [employer] advantage ins will cover now. Sending note to [facility] CMA.

## 2020-11-23 ENCOUNTER — Other Ambulatory Visit: Payer: Self-pay

## 2020-11-23 ENCOUNTER — Encounter
Admission: RE | Admit: 2020-11-23 | Discharge: 2020-11-23 | Disposition: A | Discharge status: Home / Self Care | Payer: Medicare Other | Source: Ambulatory Visit | Attending: Pulmonary Disease | Admitting: Pulmonary Disease

## 2020-11-23 HISTORY — DX: Gastro-esophageal reflux disease without esophagitis: K21.9

## 2020-11-23 NOTE — Patient Instructions (Addendum)
Your procedure is scheduled on:12-02-20 WEDNESDAY Report to the Registration Desk on the 1st floor of the [facility]-Then proceed to the 2nd floor Surgery Desk in the [facility] To find out your arrival time, please call [phone] between 1PM - 3PM on:12-01-20 TUESDAY  REMEMBER: Instructions that are not followed completely may result in serious medical risk, up to and including death; or upon the discretion of your surgeon and anesthesiologist your surgery may need to be rescheduled.  Do not eat food after midnight the night before surgery.  No gum chewing, lozengers or hard candies.  You may however, drink WATER up to 2 hours before you are scheduled to arrive for your surgery. Do not drink anything within 2 hours of your scheduled arrival time.  Type 1 and Type 2 diabetics should only drink water.  TAKE THESE MEDICATIONS THE MORNING OF SURGERY WITH A SIP OF WATER: -ZOCOR (SIMVASTATIN) -PRILOSEC (OMEPRAZOLE)  Use inhalers on the day of surgery and bring to the [place] ALBUTEROL INHALER TO [place]  Stop Metformin 2 days prior to surgery-LAST DOSE ON SUNDAY 11-29-20   One week prior to surgery: Stop Anti-inflammatories (NSAIDS) such as Advil, Aleve, Ibuprofen, Motrin, Naproxen, Naprosyn and Aspirin based products such as Excedrin, Goodys Powder, BC Powder-OK TO TAKE TYLENOL IF NEEDED  Stop ANY OVER THE COUNTER supplements until after surgery. (However, you may continue taking Vitamin B12 up until the day before surgery.)  No Alcohol for 24 hours before or after surgery.  No Smoking including e-cigarettes for 24 hours prior to surgery.  No chewable tobacco products for at least 6 hours prior to surgery.  No nicotine patches on the day of surgery.  Do not use any "recreational" drugs for at least a week prior to your surgery.  Please be advised that the combination of cocaine and anesthesia may have negative  outcomes, up to and including death. If you test positive for cocaine, your surgery will be cancelled.  On the morning of surgery brush your teeth with toothpaste and water, you may rinse your mouth with mouthwash if you wish. Do not swallow any toothpaste or mouthwash.  Do not wear jewelry, make-up, hairpins, clips or nail polish.  Do not wear lotions, powders, or perfumes.   Do not shave body from the neck down 48 hours prior to surgery just in case you cut yourself which could leave a site for infection.  Also, freshly shaved skin may become irritated if using the CHG soap.  Contact lenses, hearing aids and dentures may not be worn into surgery.  Do not bring valuables to the hospital. [facility] is not responsible for any missing/lost belongings or valuables.    Notify your doctor if there is any change in your medical condition (cold, fever, infection).  Wear comfortable clothing (specific to your surgery type) to the hospital.  Plan for stool softeners for home use; pain medications have a tendency to cause constipation. You can also help prevent constipation by eating foods high in fiber such as fruits and vegetables and drinking plenty of fluids as your diet allows.  After surgery, you can help prevent lung complications by doing breathing exercises.  Take deep breaths and cough every 1-2 hours. Your doctor may order a device called an Incentive Spirometer to help you take deep breaths. When coughing or sneezing, hold a pillow firmly against your incision with both hands. This is called "splinting." Doing this helps protect  your incision. It also decreases belly discomfort.  If you are being admitted to the hospital overnight, leave your suitcase in the car. After surgery it may be brought to your room.  If you are being discharged the day of surgery, you will not be allowed to drive home. You will need a responsible adult (18 years or older) to drive you home and stay with  you that night.   If you are taking public transportation, you will need to have a responsible adult (18 years or older) with you. Please confirm with your physician that it is acceptable to use public transportation.   Please call the [facility] Dept. at [phone] if you have any questions about these instructions.  Visitation Policy:  Patients undergoing a surgery or procedure may have one family member or support person with them as long as that person is not COVID-19 positive or experiencing its symptoms.  That person may remain in the waiting area during the procedure.  Inpatient Visitation:    Visiting hours are 7 a.m. to 8 p.m. Patients will be allowed one visitor. The visitor may change daily. The visitor must pass COVID-19 screenings, use hand sanitizer when entering and exiting the patient's room and wear a mask at all times, including in the patient's room. Patients must also wear a mask when staff or their visitor are in the room. Masking is required regardless of vaccination status. Systemwide, no visitors 17 or younger.

## 2020-11-23 NOTE — Pre-Procedure Instructions (Signed)
EKG  ED ECG REPORT I, Hinda Kehr, the attending physician, personally viewed and interpreted this ECG.  Date: 08/22/2020 EKG Time: 3:42 Rate: 91 Rhythm: normal sinus rhythm QRS Axis: normal Intervals: LVH ST/T Wave abnormalities: Non-specific ST segment / T-wave changes, but no clear evidence of acute ischemia. Narrative Interpretation: no definitive evidence of acute ischemia; does not meet STEMI criteria.  ____________________________________________

## 2020-11-25 ENCOUNTER — Ambulatory Visit: Payer: Medicare Other

## 2020-11-25 ENCOUNTER — Encounter
Admission: RE | Admit: 2020-11-25 | Discharge: 2020-11-25 | Disposition: A | Discharge status: Home / Self Care | Payer: Medicare Other | Source: Ambulatory Visit | Attending: Pulmonary Disease | Admitting: Pulmonary Disease

## 2020-11-25 ENCOUNTER — Encounter: Payer: Self-pay | Admitting: Pulmonary Disease

## 2020-11-25 ENCOUNTER — Ambulatory Visit: Payer: Medicare Other | Admitting: Internal Medicine

## 2020-11-25 ENCOUNTER — Telehealth: Payer: Self-pay | Admitting: *Deleted

## 2020-11-25 ENCOUNTER — Ambulatory Visit: Admission: RE | Admit: 2020-11-25 | Payer: Medicare Other | Source: Ambulatory Visit

## 2020-11-25 ENCOUNTER — Other Ambulatory Visit: Payer: Self-pay

## 2020-11-25 ENCOUNTER — Other Ambulatory Visit: Payer: Medicare Other

## 2020-11-25 ENCOUNTER — Ambulatory Visit
Admission: RE | Admit: 2020-11-25 | Discharge: 2020-11-25 | Disposition: A | Discharge status: Home / Self Care | Payer: Medicare Other | Source: Ambulatory Visit | Attending: Pulmonary Disease | Admitting: Pulmonary Disease

## 2020-11-25 DIAGNOSIS — E119 Type 2 diabetes mellitus without complications: Secondary | ICD-10-CM | POA: Insufficient documentation

## 2020-11-25 DIAGNOSIS — I700 Atherosclerosis of aorta: Secondary | ICD-10-CM | POA: Diagnosis not present

## 2020-11-25 DIAGNOSIS — Z01818 Encounter for other preprocedural examination: Secondary | ICD-10-CM | POA: Insufficient documentation

## 2020-11-25 DIAGNOSIS — R918 Other nonspecific abnormal finding of lung field: Secondary | ICD-10-CM | POA: Diagnosis not present

## 2020-11-25 DIAGNOSIS — I10 Essential (primary) hypertension: Secondary | ICD-10-CM | POA: Insufficient documentation

## 2020-11-25 DIAGNOSIS — J432 Centrilobular emphysema: Secondary | ICD-10-CM | POA: Diagnosis not present

## 2020-11-25 DIAGNOSIS — I2510 Atherosclerotic heart disease of native coronary artery without angina pectoris: Secondary | ICD-10-CM | POA: Diagnosis not present

## 2020-11-25 LAB — BASIC METABOLIC PANEL
Anion gap: 11 (ref 5–15)
BUN: 14 mg/dL (ref 8–23)
CO2: 24 mmol/L (ref 22–32)
Calcium: 10 mg/dL (ref 8.9–10.3)
Chloride: 104 mmol/L (ref 98–111)
Creatinine, Ser: 0.89 mg/dL (ref 0.44–1.00)
GFR, Estimated: >60 mL/min (ref >60)
Glucose, Bld: 81 mg/dL (ref 70–99)
Potassium: 3.8 mmol/L (ref 3.5–5.1)
Sodium: 139 mmol/L (ref 135–145)

## 2020-11-25 LAB — CBC
HCT: 35.9 % — ABNORMAL LOW (ref 36.0–46.0)
Hemoglobin: 11.2 g/dL — ABNORMAL LOW (ref 12.0–15.0)
MCH: 26.4 pg (ref 26.0–34.0)
MCHC: 31.2 g/dL (ref 30.0–36.0)
MCV: 84.5 fL (ref 80.0–100.0)
Platelets: 243 10*3/uL (ref 150–400)
RBC: 4.25 MIL/uL (ref 3.87–5.11)
RDW: 14.4 % (ref 11.5–15.5)
WBC: 4.3 10*3/uL (ref 4.0–10.5)
nRBC: 0 % (ref 0.0–0.2)

## 2020-11-25 NOTE — Telephone Encounter (Signed)
Request for pre-operative cardiac clearance Received: Today Kathryn Kitchens, NP  [facility] Request for pre-operative cardiac clearance:    1. What type of surgery is being performed?  VIDEO BRONCHOSCOPY WITH ENDOBRONCHIAL NAVIGATION   2. When is this surgery scheduled?  12/02/2020    3. Are there any medications that need to be held prior to surgery?  NONE   4. Practice name and name of physician performing surgery?  Performing surgeon: Dr. Vernard Gambles, MD  Requesting clearance: Honor Loh, FNP-C     5. Anesthesia type (none, local, MAC, general)? General   6. What is the office phone and fax number?   Phone: [phone]  Fax: [phone]   NOTE: EKG changes (TWIs in II, III, aVF, V3-V5). Patient angina or anginal equivalent symptoms.   ATTENTION: Unable to create telephone message as per your standard workflow. Directed by [facility] providers to send requests for cardiac clearance to this pool for appropriate distribution to provider covering pre-operative clearances.   Honor Loh, MSN, APRN, FNP-C, CEN  [facility]  Peri-operative Services Nurse Practitioner  Phone: [phone]  11/25/20 11:14 AM

## 2020-11-25 NOTE — Telephone Encounter (Signed)
   Primary Cardiologist: No primary care provider on file.  Chart reviewed and patient contacted today by phone as part of pre-operative protocol coverage. Given past medical history and time since last visit, based on ACC/AHA guidelines, Kathryn Ware would be at acceptable risk for the planned procedure without further cardiovascular testing.   The patient was advised that if she develops new symptoms prior to surgery to contact our office to arrange for a follow-up visit, and she verbalized understanding.  I will route this recommendation to the requesting party via Epic fax function and remove from pre-op pool.  Please call with questions.  Kerin Ransom, PA-C 11/25/2020, 11:40 AM

## 2020-11-25 NOTE — Progress Notes (Addendum)
[facility]  Pre-Admission/Anesthesia Testing Clinical Review  Date: 11/25/20  Patient Demographics:  Name: Kathryn Ware DOB:   26-Jan-1941 MRN:   [id number]  Planned Surgical Procedure(s):    Case: [id number] Date/Time: 12/02/20 1230   Procedure: VIDEO BRONCHOSCOPY WITH ENDOBRONCHIAL NAVIGATION (Left )   Anesthesia type: General   Pre-op diagnosis: LEFT UPPER LOBE LUNG NODULE   Location: [place] [facility] 02 / [place] [facility] FOR ANESTHESIA GROUP   Surgeons: Tyler Pita, MD    NOTE: Available PAT nursing documentation and vital signs have been reviewed. Clinical nursing staff has updated patient's PMH/PSHx, current medication list, and drug allergies/intolerances to ensure comprehensive history available to assist in medical decision making as it pertains to the aforementioned surgical procedure and anticipated anesthetic course.   Clinical Discussion:  Kathryn Ware is a 80 y.o. female who is submitted for pre-surgical anesthesia review and clearance prior to her undergoing the above procedure. Patient is a Current Smoker (63 pack years). Pertinent PMH includes: CAD, CHF, aortic atherosclerosis, TIA, PAD, HTN, HLD, T2DM, CKD-III, GERD, anemia, mild cognitive impairment.  Patient is followed by cardiology Fletcher Anon, MD). She was last seen in the cardiology clinic on 01/09/2020; notes reviewed.  At the time of her clinic visit, patient was being seen for evaluation of shortness of breath and elevated troponin in the setting of severe anemia.  Patient with recent multiple hospitalizations for gastrointestinal bleeding. During recent admission, patient's hemoglobin as low as 4.4.  EGD revealed multiple AVMs that were treated with APC.  Patient received blood transfusions.  TTE performed on 01/07/2020 revealed hyperdynamic left ventricular systolic function with an EF of >75%.  There was calcification of the aortic valve with no significant stenosis  appreciated.  There was also mild mitral annular calcifications noted.  At the time of her clinic visit, patient reported that she felt better overall following recent hospitalization for blood transfusion.  She continued to experience exertional dyspnea.  Patient on GDMT for his HTN and HLD diagnoses.  Blood pressure moderately controlled at 143/76 on currently prescribed ACEi and diuretic therapy.  Patient is on a statin for her HLD.  ECG in the clinic revealed sinus tachycardia with LVH and noted repolarization abnormalities.  Given patient's recent hospitalization, severe anemia, and multiple risk factors for CAD, myocardial perfusion imaging study was recommended to evaluate for ischemia.  Cardiologist discussed that invasive evaluation would be deferred unless significant evidence noted on ordered perfusion imaging given patient's COPD, daily tobacco use, and history of recurrent GI bleeding.  Patient scheduled to follow-up with outpatient cardiology on an as-needed basis if MPI study negative.   Myocardial perfusion imaging study performed on 01/30/2020 revealed mild to moderate three-vessel CAD with moderate aortic atherosclerosis.  There was no evidence of significant ischemia; LVEF 87% (see full interpretation of cardiovascular testing below).  Low-dose lung cancer screening CT performed on 10/05/2020.  Imaging demonstrated a spiculated peripheral apical left upper lobe pulmonary nodule that was highly suspicious for primary bronchogenic carcinoma.  Imaging also noted scattered smaller contralateral pulmonary nodules. Three vessel CAD again mentioned.  Subsequent PET imaging performed on 11/12/2020 revealed multiple concerning findings as follows:   Soft tissue nodule within the right parotid gland measuring 1.5 cm (maximum SUV 9.85) concerning for primary parotid neoplasm  Subpleural nodule in the lateral left apex measuring 2.2 cm (maximum SUV 9.22) highly concerning for primary bronchogenic  carcinoma  Irregular indistinct peripheral right upper lobe lung nodule measuring 9 mm (maximum  SUV 0.74)  Patient referred to pulmonology for further evaluation and ongoing management.  Patient met with Dr. Vernard Gambles on 11/18/2020; notes reviewed.  At the time of her clinic visit, patient was doing well overall.  She denied any shortness of breath, cough, or hemoptysis.  She was not experiencing any chest pain, PND, orthopnea.  Patient with COPD diagnosis for which she uses a SABA inhaler (albuterol) on a as needed basis, however she notes that she does not use this medication.  Patient tender to smoke.  Pulmonology discussed need for tissue biopsy to rule out primary bronchogenic carcinoma.  Discussed best approach would be ENB/EBUS.  Patient in agreement with plan of care.  Pulmonologist requesting super D contrast CT prior to procedure.  Super D contrast CT scan of the chest was performed on 11/25/2020 revealing an unchanged spiculated mass in the peripheral left upper lobe measuring 2.6 x 1.7 cm that was previously FDG avid.  Patient scheduled for biopsy on 12/02/2020.  In light of patient's past medical history significant for cardiovascular disease and multiple other comorbidities, presurgical cardiac clearance was sought by PAT team.  Per cardiology, "given past medical history and time since last visit, based on ACC/AHA guidelines, patient would be at an overall ACCEPTABLE risk for the planned procedure without further cardiovascular testing".  Patient is not currently on any anticoagulation or antiplatelet therapies.  She denies previous perioperative complications with anesthesia. She underwent a general anesthetic course here (ASA III) in 07/2020 with no documented complications.   Vitals with BMI 11/18/2020 10/05/2020 09/01/2020  Height '5\' 6"'  '5\' 4"'  '5\' 4"'   Weight 96 lbs 10 oz 98 lbs 98 lbs  BMI 15.6 67.89 38.10  Systolic 175 - 102  Diastolic 62 - 70  Pulse 78 - 61     Providers/Specialists:   NOTE: Primary physician provider listed below. Patient may have been seen by APP or partner within same practice.   PROVIDER ROLE / SPECIALTY LAST OV  Tyler Pita, MD Pulmonary Medicine (Surgeon)  11/18/2020  Venia Carbon, MD Primary Care Provider  09/01/2020  Marlyne Beards, MD  Cardiology  01/09/2020  Charlaine Dalton, MD Hematology/Oncology  New patient referral - appointment 11/30/2020   Allergies:  Nifedipine and Metformin   Current Home Medications:   No current facility-administered medications for this encounter.   . clonazePAM (KLONOPIN) 0.5 MG tablet  . ferrous sulfate 325 (65 FE) MG tablet  . lisinopril-hydrochlorothiazide (ZESTORETIC) 20-12.5 MG tablet  . metFORMIN (GLUCOPHAGE-XR) 500 MG 24 hr tablet  . omeprazole (PRILOSEC) 40 MG capsule  . simvastatin (ZOCOR) 80 MG tablet  . vitamin B-12 (CYANOCOBALAMIN) 1000 MCG tablet  . Accu-Chek Softclix Lancets lancets  . albuterol (VENTOLIN HFA) 108 (90 Base) MCG/ACT inhaler  . Blood Glucose Monitoring Suppl (ACCU-CHEK GUIDE) w/Device KIT  . glucose blood (ACCU-CHEK GUIDE) test strip   History:   Past Medical History:  Diagnosis Date  . Anemia   . ARF (acute renal failure) ([place])   . Blood transfusion without reported diagnosis   . Diabetes mellitus type II   . GERD (gastroesophageal reflux disease)   . GI (gastrointestinal bleed)   . GI AVM (gastrointestinal arteriovenous vascular malformation)    stomach  . History of echocardiogram 12/2013   a. 12/2013: EF 60-65%, DD, mild LVH, nl RV size & systolic function, mild MR/TR, mild AS, nl RVSP; b. TTE 04/2017: EF 60-65%, normal wall motion, GR1DD, mild MR, left atrium normal in size, normal RV systolic function, PASP normal   .  History of nuclear stress test    a. 12/2013: low risk, no sig WMA, nondiag EKG 2/2 baseline LVH w/ repol abnl, no sig ischemia, EF 70% no artifact  . HLD (hyperlipidemia)   . HTN (hypertension)   .  Hypercholesterolemia   . IDA (iron deficiency anemia)   . Polyp of colon, adenomatous   . RLS (restless legs syndrome)   . TIA (transient ischemic attack) 2021   Past Surgical History:  Procedure Laterality Date  . ABDOMINAL ADHESION SURGERY  11/2001   Dr. Tamala Julian  . ABDOMINAL HYSTERECTOMY  1976   RSO (fibroid)  . COLONOSCOPY WITH PROPOFOL N/A 07/07/2016   Procedure: COLONOSCOPY WITH PROPOFOL;  Surgeon: Manya Silvas, MD;  Location: [facility] ENDOSCOPY;  Service: Endoscopy;  Laterality: N/A;  . COLONOSCOPY WITH PROPOFOL N/A 12/14/2016   Procedure: COLONOSCOPY WITH PROPOFOL;  Surgeon: Manya Silvas, MD;  Location: [facility] ENDOSCOPY;  Service: Endoscopy;  Laterality: N/A;  . DOBUTAMINE STRESS ECHO  11/10   normal  . DOPPLER ECHOCARDIOGRAPHY     EF 55%, mild MR, TR  . ENTEROSCOPY N/A 06/08/2018   Procedure: ENTEROSCOPY;  Surgeon: Lin Landsman, MD;  Location: [facility] ENDOSCOPY;  Service: Gastroenterology;  Laterality: N/A;  . ENTEROSCOPY N/A 01/06/2020   Procedure: ENTEROSCOPY;  Surgeon: Lin Landsman, MD;  Location: [facility] ENDOSCOPY;  Service: Gastroenterology;  Laterality: N/A;  . ENTEROSCOPY N/A 08/22/2020   Procedure: ENTEROSCOPY;  Surgeon: [name], MD;  Location: [facility] ENDOSCOPY;  Service: Gastroenterology;  Laterality: N/A;  . ESOPHAGOGASTRODUODENOSCOPY  07/2006   multiple angioectasias  . ESOPHAGOGASTRODUODENOSCOPY Left 03/12/2015   Procedure: place tube in throat and evaluate stomach and duodenum for source of bleeding. Cauterize if concern for rebleeding.;  Surgeon: Hulen Luster, MD;  Location: [facility] ENDOSCOPY;  Service: Endoscopy;  Laterality: Left;  . ESOPHAGOGASTRODUODENOSCOPY N/A 12/14/2016   Procedure: ESOPHAGOGASTRODUODENOSCOPY (EGD);  Surgeon: Manya Silvas, MD;  Location: [facility] ENDOSCOPY;  Service: Endoscopy;  Laterality: N/A;  . ESOPHAGOGASTRODUODENOSCOPY N/A 07/19/2017   Procedure: ESOPHAGOGASTRODUODENOSCOPY (EGD);  Surgeon: Lin Landsman, MD;  Location: [facility]  ENDOSCOPY;  Service: Gastroenterology;  Laterality: N/A;  . ESOPHAGOGASTRODUODENOSCOPY (EGD) WITH PROPOFOL N/A 11/23/2015   Procedure: ESOPHAGOGASTRODUODENOSCOPY (EGD) WITH PROPOFOL;  Surgeon: Hulen Luster, MD;  Location: [facility] ENDOSCOPY;  Service: Gastroenterology;  Laterality: N/A;  . ESOPHAGOGASTRODUODENOSCOPY (EGD) WITH PROPOFOL N/A 07/07/2016   Procedure: ESOPHAGOGASTRODUODENOSCOPY (EGD) WITH PROPOFOL;  Surgeon: Manya Silvas, MD;  Location: [facility] ENDOSCOPY;  Service: Endoscopy;  Laterality: N/A;  . ESOPHAGOGASTRODUODENOSCOPY (EGD) WITH PROPOFOL N/A 05/19/2017   Procedure: ESOPHAGOGASTRODUODENOSCOPY (EGD) WITH PROPOFOL;  Surgeon: Manya Silvas, MD;  Location: [facility] ENDOSCOPY;  Service: Endoscopy;  Laterality: N/A;  . ESOPHAGOGASTRODUODENOSCOPY (EGD) WITH PROPOFOL N/A 06/26/2020   Procedure: ESOPHAGOGASTRODUODENOSCOPY (EGD) WITH PROPOFOL;  Surgeon: Lucilla Lame, MD;  Location: [facility] ENDOSCOPY;  Service: Endoscopy;  Laterality: N/A;  . laryngeal polyp  10/2008   Dr. Tami Ribas  . TOP     Family History  Problem Relation Age of Onset  . Stroke Father   . Cancer Sister        breast cancer  . Hypertension Mother   . Hypertension Other        sibling  . Diabetes Other        grandmother   Social History   Tobacco Use  . Smoking status: Current Every Day Smoker    Packs/day: 1.00    Years: 63.00    Pack years: 63.00    Types: Cigarettes  . Smokeless tobacco: Never Used  Vaping Use  . Vaping Use: Never used  Substance Use Topics  . Alcohol use: No  . Drug use: No    Pertinent Clinical Results:  LABS: Labs reviewed: Acceptable for surgery.  Hospital Outpatient Visit on 11/25/2020  Component Date Value Ref Range Status  . WBC 11/25/2020 4.3  4.0 - 10.5 K/uL Final  . RBC 11/25/2020 4.25  3.87 - 5.11 MIL/uL Final  . Hemoglobin 11/25/2020 11.2* 12.0 - 15.0 g/dL Final  . HCT 11/25/2020 35.9* 36.0 - 46.0 % Final  . MCV 11/25/2020 84.5  80.0 - 100.0 fL Final  . MCH 11/25/2020 26.4   26.0 - 34.0 pg Final  . MCHC 11/25/2020 31.2  30.0 - 36.0 g/dL Final  . RDW 11/25/2020 14.4  11.5 - 15.5 % Final  . Platelets 11/25/2020 243  150 - 400 K/uL Final  . nRBC 11/25/2020 0.0  0.0 - 0.2 % Final   Performed at [facility], [address] [place], [place] [address]  . Sodium 11/25/2020 139  135 - 145 mmol/L Final  . Potassium 11/25/2020 3.8  3.5 - 5.1 mmol/L Final  . Chloride 11/25/2020 104  98 - 111 mmol/L Final  . CO2 11/25/2020 24  22 - 32 mmol/L Final  . Glucose, Bld 11/25/2020 81  70 - 99 mg/dL Final   Glucose reference range applies only to samples taken after fasting for at least 8 hours.  . BUN 11/25/2020 14  8 - 23 mg/dL Final  . Creatinine, Ser 11/25/2020 0.89  0.44 - 1.00 mg/dL Final  . Calcium 11/25/2020 10.0  8.9 - 10.3 mg/dL Final  . GFR, Estimated 11/25/2020 >60  >60 mL/min Final   Comment: (NOTE) Calculated using the CKD-EPI Creatinine Equation (2021)   . Anion gap 11/25/2020 11  5 - 15 Final   Performed at [facility], [place],  [address]    ECG: Date: 11/25/2020  Time ECG obtained: 1050 AM Rate: 57 bpm Rhythm: Sinus bradycardia with marked sinus arrhythmia; LVH Intervals: PR 148 ms. QRS 66 ms. QTc 424 ms. ST segment and T wave changes: Evidence of an old septal infarct. TWIs in anterior and inferior leads  Comparison: TWIs seem to have progressed since last tracing on 08/23/2020 NOTES: ECG reviewed with cardiology and PCCM.  Per Dr. Patsey Berthold," has had similar pattern noted on prior ECGs.  Has significant left ventricular hypertrophy by 2D echo with hyperdynamic left ventricle.  Negative nuclear stress test for ischemia and 01/2020".   IMAGING / PROCEDURES: SUPER D CHEST CT WITHOUT CONTRAST performed on 11/25/2020 1. Unchanged spiculated mass of the peripheral left upper lobe measuring 2.6 x 1.7 cm, previously FDG avid. Findings remain consistent with primary lung malignancy. 2. Unchanged 3 mm subpleural nodule  of the posterior right upper lobe, nonspecific, although statistically likely to be benign sequelae of prior infection or inflammation. 3. Moderate to severe centrilobular emphysema. 4. Coronary artery disease 5. Aortic atherosclerosis  6. Emphysema   PET SCAN INITIAL SKULL BASE TO THIGH performed on 11/12/2020 1. The spiculated peripheral apical left upper lobe pulmonary nodule is FDG avid and highly concerning for primary bronchogenic carcinoma.  2. No signs of FDG avid nodal or distant metastatic disease. 3. Soft tissue nodule within the right parotid gland is FDG avid and concerning for primary parotid neoplasm such as pleomorphic adenoma. Referral to otolaryngology advise for further management. 4. Aortic atherosclerosis  5. Emphysema  6. Coronary artery calcifications.  CT CHEST LUNG CANCER SCREENING  LOW DOSE performed on 10/05/2020 1. Lung-RADS 4B, suspicious.  2. Spiculated peripheral apical left upper lobe pulmonary nodule measuring 20.7 mm in volume derived mean diameter, highly suspicious for primary bronchogenic carcinoma. 3. Additional scattered smaller contralateral pulmonary nodules. 4. Additional imaging evaluation or consultation with pulmonology or thoracic surgery recommended. PET-CT suggested for characterization. 5. Three-vessel coronary atherosclerosis. 6. Asymmetric severe right renal atrophy. 7. Aortic atherosclerosis 8. Emphysema  LEXISCAN performed on 01/30/2020 1. LVEF 87% 2. Pharmacological myocardial perfusion imaging study with no evidence of significant ischemia 3. Normal wall motion 4. No EKG changes concerning for ischemia at peak stress during recovery 5. Resting EKG with LVH; strain pattern 6. CT attenuation correction imaging with mild to moderate three-vessel coronary calcification and moderate aortic atherosclerosis 7. Low risk scan  ECHOCARDIOGRAM performed on 01/07/2020 1. Left ventricular ejection fraction, by estimation, is >75%.  2. Left  ventricular ejection fraction by PLAX is 82 %.  3. The left ventricle has hyperdynamic function.  4. The left ventricle has no regional wall motion abnormalities.  5. There is moderate left ventricular hypertrophy.  6. Left ventricular diastolic parameters are consistent with Grade I diastolic dysfunction (impaired relaxation).  7. Right ventricular systolic function is normal.  8. The right ventricular size is normal.  9. There is mildly elevated pulmonary artery systolic pressure.  10. The mitral valve is grossly normal. Trivial mitral valve regurgitation.  11. The aortic valve is grossly normal. Aortic valve regurgitation is not visualized.   Impression and Plan:  Kathryn Ware has been referred for pre-anesthesia review and clearance prior to her undergoing the planned anesthetic and procedural courses. Available labs, pertinent testing, and imaging results were personally reviewed by me. This patient has been appropriately cleared by cardiology with an overall all caps acceptable risk for significant perioperative cardiovascular complications.   Based on clinical review performed today (11/25/20), barring any significant acute changes in the patient's overall condition, it is anticipated that she will be able to proceed with the planned surgical intervention. Any acute changes in clinical condition may necessitate her procedure being postponed and/or cancelled. Pre-surgical instructions were reviewed with the patient during her PAT appointment and questions were fielded by PAT clinical staff.  Honor Loh, MSN, APRN, FNP-C, CEN [facility]  Peri-operative Services Nurse Practitioner Phone: [phone] 11/25/20 1:34 PM  NOTE: This note has been prepared using Dragon dictation software. Despite my best ability to proofread, there is always the potential that unintentional transcriptional errors may still occur from this process.

## 2020-11-26 ENCOUNTER — Telehealth: Payer: Self-pay | Admitting: Internal Medicine

## 2020-11-26 MED ORDER — ONETOUCH DELICA LANCETS 33G MISC: 1.0000 | Freq: Every day | 12 refills | Status: AC

## 2020-11-26 MED ORDER — ONETOUCH VERIO W/DEVICE KIT: 1.0000 | PACK | Freq: Once | 0 refills | Status: AC

## 2020-11-26 MED ORDER — ONETOUCH VERIO VI STRP: ORAL_STRIP | 12 refills | Status: AC

## 2020-11-26 MED ORDER — ONETOUCH DELICA LANCING DEV MISC: 1.0000 | Freq: Once | 0 refills | Status: AC

## 2020-11-26 NOTE — Telephone Encounter (Signed)
Pt called to see what new diabetic meter and test strips could be sent in for pt since no longer can get the one touch test strips pt has been using. Pt will call her ins co or pharmacy to get the name of a diabetic meter, test strips, and lancets that her ins will cover and pt will cb with info. Sending note to [facility] CMA.

## 2020-11-26 NOTE — Addendum Note (Signed)
Addended by: Pilar Grammes on: 11/26/2020 10:43 AM   Modules accepted: Orders

## 2020-11-26 NOTE — Telephone Encounter (Signed)
Pt called back asking for a refill of albuterol inhaler. I asked her if she was using it often. She said 1-2 times daily. Then she said 1 time daily because Dr Silvio Pate gave it to her. Then she said she only took it because Dr Silvio Pate gave it to her. She was not short of breath. She then said not to fill it.

## 2020-11-26 NOTE — Telephone Encounter (Signed)
We decided to try the One Touch Verio

## 2020-11-26 NOTE — Telephone Encounter (Signed)
Pt called in and stated that she needs a order for one touch vero test strips.

## 2020-11-26 NOTE — Telephone Encounter (Signed)
See other message

## 2020-11-30 ENCOUNTER — Other Ambulatory Visit
Admission: RE | Admit: 2020-11-30 | Discharge: 2020-11-30 | Disposition: A | Discharge status: Home / Self Care | Payer: Medicare Other | Source: Ambulatory Visit | Attending: Pulmonary Disease | Admitting: Pulmonary Disease

## 2020-11-30 ENCOUNTER — Other Ambulatory Visit: Payer: Self-pay

## 2020-11-30 ENCOUNTER — Other Ambulatory Visit: Payer: Self-pay | Admitting: *Deleted

## 2020-11-30 ENCOUNTER — Inpatient Hospital Stay (HOSPITAL_BASED_OUTPATIENT_CLINIC_OR_DEPARTMENT_OTHER): Payer: Medicare Other | Admitting: Internal Medicine

## 2020-11-30 ENCOUNTER — Inpatient Hospital Stay: Payer: Medicare Other

## 2020-11-30 ENCOUNTER — Inpatient Hospital Stay: Payer: Medicare Other | Attending: Internal Medicine

## 2020-11-30 VITALS — BP 163/66 | HR 72 | Resp 18

## 2020-11-30 DIAGNOSIS — Z90710 Acquired absence of both cervix and uterus: Secondary | ICD-10-CM | POA: Diagnosis not present

## 2020-11-30 DIAGNOSIS — I509 Heart failure, unspecified: Secondary | ICD-10-CM | POA: Insufficient documentation

## 2020-11-30 DIAGNOSIS — E7800 Pure hypercholesterolemia, unspecified: Secondary | ICD-10-CM | POA: Diagnosis not present

## 2020-11-30 DIAGNOSIS — N183 Chronic kidney disease, stage 3 unspecified: Secondary | ICD-10-CM | POA: Insufficient documentation

## 2020-11-30 DIAGNOSIS — Z833 Family history of diabetes mellitus: Secondary | ICD-10-CM | POA: Diagnosis not present

## 2020-11-30 DIAGNOSIS — K219 Gastro-esophageal reflux disease without esophagitis: Secondary | ICD-10-CM | POA: Insufficient documentation

## 2020-11-30 DIAGNOSIS — D62 Acute posthemorrhagic anemia: Secondary | ICD-10-CM

## 2020-11-30 DIAGNOSIS — F17210 Nicotine dependence, cigarettes, uncomplicated: Secondary | ICD-10-CM | POA: Diagnosis not present

## 2020-11-30 DIAGNOSIS — C3412 Malignant neoplasm of upper lobe, left bronchus or lung: Secondary | ICD-10-CM | POA: Insufficient documentation

## 2020-11-30 DIAGNOSIS — Z8249 Family history of ischemic heart disease and other diseases of the circulatory system: Secondary | ICD-10-CM | POA: Insufficient documentation

## 2020-11-30 DIAGNOSIS — Z7984 Long term (current) use of oral hypoglycemic drugs: Secondary | ICD-10-CM | POA: Insufficient documentation

## 2020-11-30 DIAGNOSIS — Z8673 Personal history of transient ischemic attack (TIA), and cerebral infarction without residual deficits: Secondary | ICD-10-CM | POA: Insufficient documentation

## 2020-11-30 DIAGNOSIS — E1122 Type 2 diabetes mellitus with diabetic chronic kidney disease: Secondary | ICD-10-CM | POA: Diagnosis not present

## 2020-11-30 DIAGNOSIS — I130 Hypertensive heart and chronic kidney disease with heart failure and stage 1 through stage 4 chronic kidney disease, or unspecified chronic kidney disease: Secondary | ICD-10-CM | POA: Insufficient documentation

## 2020-11-30 DIAGNOSIS — Z79899 Other long term (current) drug therapy: Secondary | ICD-10-CM | POA: Insufficient documentation

## 2020-11-30 DIAGNOSIS — I2510 Atherosclerotic heart disease of native coronary artery without angina pectoris: Secondary | ICD-10-CM | POA: Diagnosis not present

## 2020-11-30 DIAGNOSIS — E785 Hyperlipidemia, unspecified: Secondary | ICD-10-CM | POA: Insufficient documentation

## 2020-11-30 DIAGNOSIS — Z20822 Contact with and (suspected) exposure to covid-19: Secondary | ICD-10-CM | POA: Insufficient documentation

## 2020-11-30 DIAGNOSIS — Z01812 Encounter for preprocedural laboratory examination: Secondary | ICD-10-CM | POA: Diagnosis not present

## 2020-11-30 DIAGNOSIS — Z803 Family history of malignant neoplasm of breast: Secondary | ICD-10-CM | POA: Diagnosis not present

## 2020-11-30 DIAGNOSIS — D509 Iron deficiency anemia, unspecified: Secondary | ICD-10-CM | POA: Diagnosis not present

## 2020-11-30 DIAGNOSIS — G2581 Restless legs syndrome: Secondary | ICD-10-CM | POA: Diagnosis not present

## 2020-11-30 MED ORDER — SODIUM CHLORIDE 0.9 % IV SOLN
Freq: Once | INTRAVENOUS | Status: AC
  Filled 2020-11-30: qty 250

## 2020-11-30 MED ORDER — SODIUM CHLORIDE 0.9 % IV SOLN: 200.0000 mg | Freq: Once | INTRAVENOUS | Status: DC

## 2020-11-30 MED ORDER — IRON SUCROSE 20 MG/ML IV SOLN
200.0000 mg | Freq: Once | INTRAVENOUS | Status: AC
  Administered 2020-11-30: 200 mg via INTRAVENOUS
  Filled 2020-11-30: qty 10

## 2020-11-30 NOTE — Progress Notes (Signed)
Kathryn Ware tolerated her first Venofer infusion today without any complications. Vital signs stable at discharge.

## 2020-11-30 NOTE — Progress Notes (Signed)
[facility] CONSULT NOTE  Patient Care Team: Venia Carbon, MD as PCP - General  CHIEF COMPLAINTS/PURPOSE OF CONSULTATION: ANEMIA   HEMATOLOGY HISTORY  #Iron deficient anemia chronic [September 2021--5.6; s/p PRBC 3 units]-GI bleeding-gastric/small bowel AVMs status post APC [Dr.Wohl/[place]]  # LCSP- DEC 2021- LUL spiculated nodule 2.6 x1.7 cm; PET positive on PET [Dr.G; pul]; bronch on 12/03/2020.   HISTORY OF PRESENTING ILLNESS:  Kathryn Ware 80 y.o.  female with iron deficient anemia history of GI bleed is here for follow-up.  Patient is on oral iron.  Intermittent constipation.  No blood in stools or black-colored stools.  In the interim patient underwent lung cancer screening given history of smoking noted to have a left upper lobe nodule.  Subsequently PET were ordered again concerning for malignancy.  No distant disease.  Awaiting bronchoscopy/biopsy this week.   Review of Systems  Constitutional: Positive for malaise/fatigue and weight loss. Negative for chills, diaphoresis and fever.  HENT: Negative for nosebleeds and sore throat.   Eyes: Negative for double vision.  Respiratory: Positive for shortness of breath. Negative for cough, hemoptysis, sputum production and wheezing.   Cardiovascular: Negative for chest pain, palpitations, orthopnea and leg swelling.  Gastrointestinal: Negative for abdominal pain, blood in stool, constipation, diarrhea, heartburn, melena, nausea and vomiting.  Genitourinary: Negative for dysuria, frequency and urgency.  Musculoskeletal: Positive for back pain and joint pain.  Skin: Negative.  Negative for itching and rash.  Neurological: Negative for dizziness, tingling, focal weakness, weakness and headaches.  Endo/Heme/Allergies: Does not bruise/bleed easily.  Psychiatric/Behavioral: Negative for depression. The patient is not nervous/anxious and does not have insomnia.     MEDICAL HISTORY:  Past Medical History:  Diagnosis  Date  . Anemia   . Aortic atherosclerosis ([place])   . Blood transfusion without reported diagnosis   . CAD (coronary artery disease)   . CHF (congestive heart failure) ([place])   . CKD (chronic kidney disease), stage III ([place])   . Diabetes mellitus type II   . GERD (gastroesophageal reflux disease)   . GI (gastrointestinal bleed)   . GI AVM (gastrointestinal arteriovenous vascular malformation)    stomach  . History of echocardiogram 12/2013   a. 12/2013: EF 60-65%, DD, mild LVH, nl RV size & systolic function, mild MR/TR, mild AS, nl RVSP; b. TTE 04/2017: EF 60-65%, normal wall motion, GR1DD, mild MR, left atrium normal in size, normal RV systolic function, PASP normal   . History of nuclear stress test    a. 12/2013: low risk, no sig WMA, nondiag EKG 2/2 baseline LVH w/ repol abnl, no sig ischemia, EF 70% no artifact  . HLD (hyperlipidemia)   . HTN (hypertension)   . Hypercholesterolemia   . IDA (iron deficiency anemia)   . Mild cognitive impairment   . PAD (peripheral artery disease) ([place])   . Polyp of colon, adenomatous   . RLS (restless legs syndrome)   . TIA (transient ischemic attack) 2021    SURGICAL HISTORY: Past Surgical History:  Procedure Laterality Date  . ABDOMINAL ADHESION SURGERY  11/2001   Dr. Tamala Julian  . ABDOMINAL HYSTERECTOMY  1976   RSO (fibroid)  . COLONOSCOPY WITH PROPOFOL N/A 07/07/2016   Procedure: COLONOSCOPY WITH PROPOFOL;  Surgeon: Manya Silvas, MD;  Location: [facility] ENDOSCOPY;  Service: Endoscopy;  Laterality: N/A;  . COLONOSCOPY WITH PROPOFOL N/A 12/14/2016   Procedure: COLONOSCOPY WITH PROPOFOL;  Surgeon: Manya Silvas, MD;  Location: [facility] ENDOSCOPY;  Service: Endoscopy;  Laterality:  N/A;  . DOBUTAMINE STRESS ECHO  11/10   normal  . DOPPLER ECHOCARDIOGRAPHY     EF 55%, mild MR, TR  . ENTEROSCOPY N/A 06/08/2018   Procedure: ENTEROSCOPY;  Surgeon: Lin Landsman, MD;  Location: [facility] ENDOSCOPY;  Service: Gastroenterology;  Laterality: N/A;  .  ENTEROSCOPY N/A 01/06/2020   Procedure: ENTEROSCOPY;  Surgeon: Lin Landsman, MD;  Location: [facility] ENDOSCOPY;  Service: Gastroenterology;  Laterality: N/A;  . ENTEROSCOPY N/A 08/22/2020   Procedure: ENTEROSCOPY;  Surgeon: [name], MD;  Location: [facility] ENDOSCOPY;  Service: Gastroenterology;  Laterality: N/A;  . ESOPHAGOGASTRODUODENOSCOPY  07/2006   multiple angioectasias  . ESOPHAGOGASTRODUODENOSCOPY Left 03/12/2015   Procedure: place tube in throat and evaluate stomach and duodenum for source of bleeding. Cauterize if concern for rebleeding.;  Surgeon: Hulen Luster, MD;  Location: [facility] ENDOSCOPY;  Service: Endoscopy;  Laterality: Left;  . ESOPHAGOGASTRODUODENOSCOPY N/A 12/14/2016   Procedure: ESOPHAGOGASTRODUODENOSCOPY (EGD);  Surgeon: Manya Silvas, MD;  Location: [facility] ENDOSCOPY;  Service: Endoscopy;  Laterality: N/A;  . ESOPHAGOGASTRODUODENOSCOPY N/A 07/19/2017   Procedure: ESOPHAGOGASTRODUODENOSCOPY (EGD);  Surgeon: Lin Landsman, MD;  Location: [facility] ENDOSCOPY;  Service: Gastroenterology;  Laterality: N/A;  . ESOPHAGOGASTRODUODENOSCOPY (EGD) WITH PROPOFOL N/A 11/23/2015   Procedure: ESOPHAGOGASTRODUODENOSCOPY (EGD) WITH PROPOFOL;  Surgeon: Hulen Luster, MD;  Location: [facility] ENDOSCOPY;  Service: Gastroenterology;  Laterality: N/A;  . ESOPHAGOGASTRODUODENOSCOPY (EGD) WITH PROPOFOL N/A 07/07/2016   Procedure: ESOPHAGOGASTRODUODENOSCOPY (EGD) WITH PROPOFOL;  Surgeon: Manya Silvas, MD;  Location: [facility] ENDOSCOPY;  Service: Endoscopy;  Laterality: N/A;  . ESOPHAGOGASTRODUODENOSCOPY (EGD) WITH PROPOFOL N/A 05/19/2017   Procedure: ESOPHAGOGASTRODUODENOSCOPY (EGD) WITH PROPOFOL;  Surgeon: Manya Silvas, MD;  Location: [facility] ENDOSCOPY;  Service: Endoscopy;  Laterality: N/A;  . ESOPHAGOGASTRODUODENOSCOPY (EGD) WITH PROPOFOL N/A 06/26/2020   Procedure: ESOPHAGOGASTRODUODENOSCOPY (EGD) WITH PROPOFOL;  Surgeon: Lucilla Lame, MD;  Location: [facility] ENDOSCOPY;  Service: Endoscopy;  Laterality: N/A;  .  laryngeal polyp  10/2008   Dr. Tami Ribas  . TOP      SOCIAL HISTORY: Social History   Socioeconomic History  . Marital status: Widowed    Spouse name: Not on file  . Number of children: 1  . Years of education: Not on file  . Highest education level: Not on file  Occupational History  . Occupation: Regulatory affairs officer    Comment: retired  Tobacco Use  . Smoking status: Current Every Day Smoker    Packs/day: 1.00    Years: 63.00    Pack years: 63.00    Types: Cigarettes  . Smokeless tobacco: Never Used  Vaping Use  . Vaping Use: Never used  Substance and Sexual Activity  . Alcohol use: No  . Drug use: No  . Sexual activity: Not on file  Other Topics Concern  . Not on file  Social History Narrative   Lives with sister; in [place] since 35 years; 1/2 ppd; no alcohol. Used to work in [place].       No living will   Requests son Orson Gear as health care POA   Would accept resuscitation --but no prolonged machines   Not sure about feeding tubes   Social Determinants of Health   Financial Resource Strain: Not on file  Food Insecurity: Not on file  Transportation Needs: Not on file  Physical Activity: Not on file  Stress: Not on file  Social Connections: Not on file  Intimate Partner Violence: Not on file    FAMILY HISTORY: Family History  Problem Relation Age of Onset  .  Stroke Father   . Cancer Sister        breast cancer  . Hypertension Mother   . Hypertension Other        sibling  . Diabetes Other        grandmother    ALLERGIES:  is allergic to nifedipine and metformin.  MEDICATIONS:  Current Outpatient Medications  Medication Sig Dispense Refill  . albuterol (VENTOLIN HFA) 108 (90 Base) MCG/ACT inhaler INHALE 2 PUFFS INTO THE LUNGS EVERY 6 HOURS AS NEEDED FOR WHEEZING OR SHORTNESS OF BREATH (Patient taking differently: Inhale 2 puffs into the lungs every 6 (six) hours as needed. INHALE 2 PUFFS INTO THE LUNGS EVERY 6 HOURS AS NEEDED FOR WHEEZING OR  SHORTNESS OF BREATH) 8.5 g 0  . clonazePAM (KLONOPIN) 0.5 MG tablet TAKE 1 TO 2 TABLETS(0.5 TO 1 MG) BY MOUTH AT BEDTIME AS NEEDED FOR RESTLESS LEGS (Patient taking differently: Take 1 mg by mouth at bedtime.) 60 tablet 0  . ferrous sulfate 325 (65 FE) MG tablet Take 325 mg by mouth daily with breakfast.    . glucose blood (ONETOUCH VERIO) test strip Use to check blood sugar once daily E11.69 100 each 12  . lisinopril-hydrochlorothiazide (ZESTORETIC) 20-12.5 MG tablet Take 1 tablet by mouth at bedtime.    . metFORMIN (GLUCOPHAGE-XR) 500 MG 24 hr tablet Take 500 mg by mouth daily with breakfast.    . omeprazole (PRILOSEC) 40 MG capsule Take 1 capsule (40 mg total) by mouth in the morning and at bedtime. 180 capsule 3  . OneTouch Delica Lancets 84O MISC 1 each by Does not apply route daily. Use to obtain blood sugar sample daily E11.69 100 each 12  . simvastatin (ZOCOR) 80 MG tablet TAKE 1 TABLET(80 MG) BY MOUTH DAILY (Patient taking differently: Take 80 mg by mouth every morning. TAKE 1 TABLET(80 MG) BY MOUTH DAILY) 90 tablet 0  . vitamin B-12 (CYANOCOBALAMIN) 1000 MCG tablet Take 1,000 mcg by mouth daily.     No current facility-administered medications for this visit.   Facility-Administered Medications Ordered in Other Visits  Medication Dose Route Frequency Provider Last Rate Last Admin  . iron sucrose (VENOFER) injection 200 mg  200 mg Intravenous Once Charlaine Dalton R, MD          PHYSICAL EXAMINATION:   Vitals:   11/30/20 1300  BP: (!) 156/79  Pulse: 85  Resp: 18  Temp: 97.6 F (36.4 C)   There were no vitals filed for this visit.  Physical Exam Constitutional:      Comments: Thin frail appearing/cachectic female patient.  Walk independently.  Patient is accompanied by sister.  HENT:     Head: Normocephalic and atraumatic.     Mouth/Throat:     Pharynx: No oropharyngeal exudate.  Eyes:     Pupils: Pupils are equal, round, and reactive to light.  Cardiovascular:      Rate and Rhythm: Normal rate and regular rhythm.  Pulmonary:     Effort: No respiratory distress.     Breath sounds: No wheezing.     Comments: Decreased air entry bilaterally.  No wheeze or crackles. Abdominal:     General: Bowel sounds are normal. There is no distension.     Palpations: Abdomen is soft. There is no mass.     Tenderness: There is no abdominal tenderness. There is no guarding or rebound.  Musculoskeletal:        General: No tenderness. Normal range of motion.     Cervical  back: Normal range of motion and neck supple.  Skin:    General: Skin is warm.  Neurological:     Mental Status: She is alert and oriented to person, place, and time.  Psychiatric:        Mood and Affect: Affect normal.     LABORATORY DATA:  I have reviewed the data as listed Lab Results  Component Value Date   WBC 4.3 11/25/2020   HGB 11.2 (L) 11/25/2020   HCT 35.9 (L) 11/25/2020   MCV 84.5 11/25/2020   PLT 243 11/25/2020   Recent Labs    01/05/20 0843 01/06/20 0710 06/24/20 1702 06/25/20 0500 06/26/20 0335 06/30/20 1254 08/22/20 0306 08/23/20 0407 09/01/20 1146 11/25/20 1126  NA 137   < > 141 140 140 142 138 139 140 139  K 4.6   < > 4.6 4.1 3.6 4.7 4.8 4.2 4.5 3.8  CL 107   < > 109 111 113* 106 108 110 104 104  CO2 17*   < > 19* 20* 19* 28 21* 23 30 24   GLUCOSE 258*   < > 151* 91 74 105* 199* 116* 92 81  BUN 60*   < > 41* 40* 24* 9 36* 19 14 14   CREATININE 1.49*   < > 1.25* 1.15* 0.89 0.97 1.26* 0.88 0.96 0.89  CALCIUM 9.1   < > 10.2 9.0 8.5* 10.4 9.3 9.0 9.9 10.0  GFRNONAA 34*   < > 41* 46* >60  --  44* >60  --  >60  GFRAA 39*   < > 48* 53* >60  --   --   --   --   --   PROT 5.6*  --   --   --   --   --  6.9  --   --   --   ALBUMIN 3.0*   < >  --   --   --  4.1 3.9  --  4.2  --   AST 20  --   --   --   --   --  24  --   --   --   ALT 10  --   --   --   --   --  8  --   --   --   ALKPHOS 29*  --   --   --   --   --  40  --   --   --   BILITOT 0.5  --   --   --   --   --   0.6  --   --   --    < > = values in this interval not displayed.     NM PET Image Initial (PI) Skull Base To Thigh  Result Date: 11/13/2020 CLINICAL DATA:  Initial treatment strategy for lung nodule. EXAM: NUCLEAR MEDICINE PET SKULL BASE TO THIGH TECHNIQUE: 5.68 mCi F-18 FDG was injected intravenously. Full-ring PET imaging was performed from the skull base to thigh after the radiotracer. CT data was obtained and used for attenuation correction and anatomic localization. Fasting blood glucose: 84 mg/dl COMPARISON:  Lung cancer screening 10/05/2020 FINDINGS: Mediastinal blood pool activity: SUV max 2.24 Liver activity: SUV max NA NECK: FDG avid soft tissue nodule within the right parotid gland measures 1.5 cm within SUV max of 9.85, image 27/3. No FDG avid lymph nodes within the soft tissues of the neck. Incidental CT findings: none CHEST: Subpleural nodule within the lateral left  apex measures 2.2 cm and has an SUV max of 9.22, image 56/3. The irregular, indistinct peripheral right upper lobe lung nodule measuring 9 mm has an SUV max of 0.74, image 66/3. No FDG avid axillary, supraclavicular, mediastinal, or hilar lymph nodes. Incidental CT findings: Moderate to advanced upper lobe predominant centrilobular and paraseptal emphysema. Aortic atherosclerosis. Coronary artery calcifications. ABDOMEN/PELVIS: No abnormal hypermetabolic activity within the liver, pancreas, adrenal glands, or spleen. No hypermetabolic lymph nodes in the abdomen or pelvis. Incidental CT findings: Aortic atherosclerosis. Small stones noted within the dependent portion of the gallbladder, image 157/3. Asymmetric atrophy of the right kidney. Small calcified uterine fibroids. SKELETON: No focal hypermetabolic activity to suggest skeletal metastasis. Incidental CT findings: none IMPRESSION: 1. The spiculated peripheral apical left upper lobe pulmonary nodule is FDG avid and highly concerning for primary bronchogenic carcinoma. No signs of  FDG avid nodal or distant metastatic disease. 2. Soft tissue nodule within the right parotid gland is FDG avid and concerning for primary parotid neoplasm such as pleomorphic adenoma. Referral to otolaryngology advise for further management. 3. Aortic Atherosclerosis (ICD10-I70.0) and Emphysema (ICD10-J43.9). Coronary artery calcifications. Electronically Signed   By: Kerby Moors M.D.   On: 11/13/2020 08:56   CT Super D Chest Wo Contrast  Result Date: 11/25/2020 CLINICAL DATA:  Bronchoscopic planning, follow-up PET-CT 11/12/2020 EXAM: CT CHEST WITHOUT CONTRAST TECHNIQUE: Multidetector CT imaging of the chest was performed using thin slice collimation for electromagnetic bronchoscopy planning purposes, without intravenous contrast. COMPARISON:  None. FINDINGS: Cardiovascular: Aortic atherosclerosis. Normal heart size. Three-vessel coronary artery calcifications. No pericardial effusion. Mediastinum/Nodes: No enlarged mediastinal, hilar, or axillary lymph nodes. Thyroid gland, trachea, and esophagus demonstrate no significant findings. Lungs/Pleura: Moderate to severe centrilobular emphysema. Unchanged spiculated mass of the peripheral left upper lobe measuring 2.6 x 1.7 cm, previously FDG avid. Unchanged 3 mm subpleural nodule of the posterior right upper lobe (series 3, image 43). Minimal bandlike scarring and or atelectasis of the peripheral right upper lobe (series 3, image 46). No pleural effusion or pneumothorax. Upper Abdomen: No acute abnormality. Asymmetrically atrophic right kidney in the included upper abdomen. Musculoskeletal: No chest wall mass or suspicious bone lesions identified. IMPRESSION: 1. Unchanged spiculated mass of the peripheral left upper lobe measuring 2.6 x 1.7 cm, previously FDG avid. Findings remain consistent with primary lung malignancy. 2. Unchanged 3 mm subpleural nodule of the posterior right upper lobe, nonspecific, although statistically likely to be benign sequelae of prior  infection or inflammation. 3. Moderate to severe centrilobular emphysema. 4. Coronary artery disease. Aortic Atherosclerosis (ICD10-I70.0) and Emphysema (ICD10-J43.9). Electronically Signed   By: Eddie Candle M.D.   On: 11/25/2020 11:31    Acute on chronic blood loss anemia # Iron deficiency anemia sec to chronic GI bleed [AVMs]-September 2021 hemoglobin-11 [June 24, 2020 nadir- hemoglobin 5.6]- today Hb 11.6. proceed with IV iron infusion today. Continue PO iron.   #ETIOLOGY: Gastric/small bowel AVM-s/p EGD argon laser coagulation [Al-GI];   # LUL nodule-- high suspicious for malignancy-PET JAN 2022- spiculated mass of the peripheral left upper lobe measuring 2.6 x 1.7 cm-awaiting biopsy this week.  Discussed that if this does turn out to be malignancy-patient will need to have treatments.  Discussed that in general treatment would be surgery however given patient's respiratory status unlikely a surgical candidate.  However no decisions made.  Await above work-up.  Will discuss at next visit.  # active smoker-again counseled the patient to quit smoking.  # DISPOSITION: # Venofer today # Follow up in  2 weeks-MD;  NO labs; Dr- Dr.B  # I reviewed the blood work- with the patient in detail; also reviewed the imaging independently [as summarized above]; and with the patient in detail.    All questions were answered. The patient knows to call the clinic with any problems, questions or concerns.    Cammie Sickle, MD 11/30/2020 1:56 PM

## 2020-11-30 NOTE — Assessment & Plan Note (Addendum)
#   Iron deficiency anemia sec to chronic GI bleed [AVMs]-September 2021 hemoglobin-11 [June 24, 2020 nadir- hemoglobin 5.6]- today Hb 11.6. proceed with IV iron infusion today. Continue PO iron.   #ETIOLOGY: Gastric/small bowel AVM-s/p EGD argon laser coagulation [Al-GI];   # LUL nodule-- high suspicious for malignancy-PET JAN 2022- spiculated mass of the peripheral left upper lobe measuring 2.6 x 1.7 cm-awaiting biopsy this week.  Discussed that if this does turn out to be malignancy-patient will need to have treatments.  Discussed that in general treatment would be surgery however given patient's respiratory status unlikely a surgical candidate.  However no decisions made.  Await above work-up.  Will discuss at next visit.  # active smoker-again counseled the patient to quit smoking.  # DISPOSITION: # Venofer today # Follow up in 2 weeks-MD;  NO labs; Dr- Dr.B  # I reviewed the blood work- with the patient in detail; also reviewed the imaging independently [as summarized above]; and with the patient in detail.

## 2020-11-30 NOTE — Pre-Procedure Instructions (Signed)
Attempted to contact patient via phone to remind her of her need for covid testing today prior to her upcoming procedure.  Left message on her machine.

## 2020-12-01 LAB — SARS CORONAVIRUS 2 (TAT 6-24 HRS): SARS Coronavirus 2: NEGATIVE

## 2020-12-02 ENCOUNTER — Ambulatory Visit: Payer: Medicare Other

## 2020-12-02 ENCOUNTER — Other Ambulatory Visit: Payer: Self-pay

## 2020-12-02 ENCOUNTER — Ambulatory Visit: Payer: Medicare HMO | Admitting: Internal Medicine

## 2020-12-02 ENCOUNTER — Ambulatory Visit: Payer: Medicare Other | Admitting: Urgent Care

## 2020-12-02 ENCOUNTER — Ambulatory Visit
Admission: RE | Admit: 2020-12-02 | Discharge: 2020-12-02 | Disposition: A | Discharge status: Home / Self Care | Payer: Medicare Other | Source: Ambulatory Visit | Attending: Pulmonary Disease | Admitting: Pulmonary Disease

## 2020-12-02 ENCOUNTER — Encounter: Payer: Self-pay | Admitting: Pulmonary Disease

## 2020-12-02 ENCOUNTER — Encounter
Admission: RE | Disposition: A | Discharge status: Home / Self Care | Payer: Self-pay | Source: Ambulatory Visit | Attending: Pulmonary Disease

## 2020-12-02 DIAGNOSIS — E785 Hyperlipidemia, unspecified: Secondary | ICD-10-CM | POA: Diagnosis not present

## 2020-12-02 DIAGNOSIS — I2510 Atherosclerotic heart disease of native coronary artery without angina pectoris: Secondary | ICD-10-CM | POA: Diagnosis not present

## 2020-12-02 DIAGNOSIS — Z7984 Long term (current) use of oral hypoglycemic drugs: Secondary | ICD-10-CM | POA: Insufficient documentation

## 2020-12-02 DIAGNOSIS — K219 Gastro-esophageal reflux disease without esophagitis: Secondary | ICD-10-CM | POA: Diagnosis not present

## 2020-12-02 DIAGNOSIS — D500 Iron deficiency anemia secondary to blood loss (chronic): Secondary | ICD-10-CM | POA: Insufficient documentation

## 2020-12-02 DIAGNOSIS — I739 Peripheral vascular disease, unspecified: Secondary | ICD-10-CM | POA: Diagnosis not present

## 2020-12-02 DIAGNOSIS — Z8673 Personal history of transient ischemic attack (TIA), and cerebral infarction without residual deficits: Secondary | ICD-10-CM | POA: Insufficient documentation

## 2020-12-02 DIAGNOSIS — E1169 Type 2 diabetes mellitus with other specified complication: Secondary | ICD-10-CM | POA: Diagnosis not present

## 2020-12-02 DIAGNOSIS — F17210 Nicotine dependence, cigarettes, uncomplicated: Secondary | ICD-10-CM | POA: Insufficient documentation

## 2020-12-02 DIAGNOSIS — N183 Chronic kidney disease, stage 3 unspecified: Secondary | ICD-10-CM | POA: Diagnosis not present

## 2020-12-02 DIAGNOSIS — Z9889 Other specified postprocedural states: Secondary | ICD-10-CM | POA: Diagnosis not present

## 2020-12-02 DIAGNOSIS — I700 Atherosclerosis of aorta: Secondary | ICD-10-CM | POA: Diagnosis not present

## 2020-12-02 DIAGNOSIS — E1122 Type 2 diabetes mellitus with diabetic chronic kidney disease: Secondary | ICD-10-CM | POA: Diagnosis not present

## 2020-12-02 DIAGNOSIS — Z79899 Other long term (current) drug therapy: Secondary | ICD-10-CM | POA: Insufficient documentation

## 2020-12-02 DIAGNOSIS — I509 Heart failure, unspecified: Secondary | ICD-10-CM | POA: Insufficient documentation

## 2020-12-02 DIAGNOSIS — G3184 Mild cognitive impairment, so stated: Secondary | ICD-10-CM | POA: Insufficient documentation

## 2020-12-02 DIAGNOSIS — R918 Other nonspecific abnormal finding of lung field: Secondary | ICD-10-CM | POA: Diagnosis not present

## 2020-12-02 DIAGNOSIS — C3412 Malignant neoplasm of upper lobe, left bronchus or lung: Secondary | ICD-10-CM | POA: Insufficient documentation

## 2020-12-02 DIAGNOSIS — J449 Chronic obstructive pulmonary disease, unspecified: Secondary | ICD-10-CM | POA: Diagnosis not present

## 2020-12-02 DIAGNOSIS — J432 Centrilobular emphysema: Secondary | ICD-10-CM | POA: Insufficient documentation

## 2020-12-02 DIAGNOSIS — R911 Solitary pulmonary nodule: Secondary | ICD-10-CM | POA: Diagnosis not present

## 2020-12-02 DIAGNOSIS — I5032 Chronic diastolic (congestive) heart failure: Secondary | ICD-10-CM | POA: Diagnosis not present

## 2020-12-02 DIAGNOSIS — I130 Hypertensive heart and chronic kidney disease with heart failure and stage 1 through stage 4 chronic kidney disease, or unspecified chronic kidney disease: Secondary | ICD-10-CM | POA: Diagnosis not present

## 2020-12-02 HISTORY — DX: Chronic kidney disease, stage 3 unspecified: N18.30

## 2020-12-02 HISTORY — DX: Peripheral vascular disease, unspecified: I73.9

## 2020-12-02 HISTORY — DX: Atherosclerosis of aorta: I70.0

## 2020-12-02 HISTORY — DX: Atherosclerotic heart disease of native coronary artery without angina pectoris: I25.10

## 2020-12-02 HISTORY — PX: VIDEO BRONCHOSCOPY WITH ENDOBRONCHIAL NAVIGATION: SHX6175

## 2020-12-02 HISTORY — DX: Heart failure, unspecified: I50.9

## 2020-12-02 HISTORY — DX: Mild cognitive impairment of uncertain or unknown etiology: G31.84

## 2020-12-02 LAB — GLUCOSE, CAPILLARY: Glucose-Capillary: 91 mg/dL (ref 70–99)

## 2020-12-02 SURGERY — VIDEO BRONCHOSCOPY WITH ENDOBRONCHIAL NAVIGATION
Anesthesia: General | Laterality: Left

## 2020-12-02 MED ORDER — LIDOCAINE HCL (CARDIAC) PF 100 MG/5ML IV SOSY
PREFILLED_SYRINGE | INTRAVENOUS | Status: DC | PRN
  Administered 2020-12-02: 50 mg via INTRAVENOUS

## 2020-12-02 MED ORDER — BUTAMBEN-TETRACAINE-BENZOCAINE 2-2-14 % EX AERO
1.0000 | INHALATION_SPRAY | Freq: Once | CUTANEOUS | Status: DC
  Filled 2020-12-02 (×4): qty 20

## 2020-12-02 MED ORDER — DEXMEDETOMIDINE (PRECEDEX) IN NS 20 MCG/5ML (4 MCG/ML) IV SYRINGE
PREFILLED_SYRINGE | INTRAVENOUS | Status: AC
  Filled 2020-12-02: qty 5

## 2020-12-02 MED ORDER — DEXAMETHASONE SODIUM PHOSPHATE 10 MG/ML IJ SOLN
INTRAMUSCULAR | Status: DC | PRN
  Administered 2020-12-02: 10 mg via INTRAVENOUS

## 2020-12-02 MED ORDER — SODIUM CHLORIDE 0.9 % IV SOLN: INTRAVENOUS | Status: DC

## 2020-12-02 MED ORDER — ROCURONIUM BROMIDE 100 MG/10ML IV SOLN
INTRAVENOUS | Status: DC | PRN
  Administered 2020-12-02: 40 mg via INTRAVENOUS

## 2020-12-02 MED ORDER — FENTANYL CITRATE (PF) 100 MCG/2ML IJ SOLN
INTRAMUSCULAR | Status: AC
  Filled 2020-12-02: qty 2

## 2020-12-02 MED ORDER — PHENYLEPHRINE HCL (PRESSORS) 10 MG/ML IV SOLN
INTRAVENOUS | Status: AC
  Filled 2020-12-02: qty 1

## 2020-12-02 MED ORDER — CHLORHEXIDINE GLUCONATE 0.12 % MT SOLN: 15.0000 mL | Freq: Once | OROMUCOSAL | Status: AC

## 2020-12-02 MED ORDER — DEXMEDETOMIDINE (PRECEDEX) IN NS 20 MCG/5ML (4 MCG/ML) IV SYRINGE
PREFILLED_SYRINGE | INTRAVENOUS | Status: DC | PRN
  Administered 2020-12-02 (×3): 4 ug via INTRAVENOUS

## 2020-12-02 MED ORDER — PROPOFOL 10 MG/ML IV BOLUS
INTRAVENOUS | Status: AC
  Filled 2020-12-02: qty 20

## 2020-12-02 MED ORDER — ORAL CARE MOUTH RINSE: 15.0000 mL | Freq: Once | OROMUCOSAL | Status: AC

## 2020-12-02 MED ORDER — ROCURONIUM BROMIDE 10 MG/ML (PF) SYRINGE
PREFILLED_SYRINGE | INTRAVENOUS | Status: AC
  Filled 2020-12-02: qty 10

## 2020-12-02 MED ORDER — PROPOFOL 10 MG/ML IV BOLUS
INTRAVENOUS | Status: DC | PRN
  Administered 2020-12-02: 80 mg via INTRAVENOUS

## 2020-12-02 MED ORDER — GLYCOPYRROLATE 0.2 MG/ML IJ SOLN
INTRAMUSCULAR | Status: AC
  Filled 2020-12-02: qty 1

## 2020-12-02 MED ORDER — GLYCOPYRROLATE 0.2 MG/ML IJ SOLN
INTRAMUSCULAR | Status: DC | PRN
  Administered 2020-12-02: .2 mg via INTRAVENOUS

## 2020-12-02 MED ORDER — ONDANSETRON HCL 4 MG/2ML IJ SOLN
INTRAMUSCULAR | Status: DC | PRN
  Administered 2020-12-02: 4 mg via INTRAVENOUS

## 2020-12-02 MED ORDER — FENTANYL CITRATE (PF) 100 MCG/2ML IJ SOLN
INTRAMUSCULAR | Status: DC | PRN
  Administered 2020-12-02 (×2): 25 ug via INTRAVENOUS
  Administered 2020-12-02: 50 ug via INTRAVENOUS

## 2020-12-02 MED ORDER — FENTANYL CITRATE (PF) 100 MCG/2ML IJ SOLN: 25.0000 ug | INTRAMUSCULAR | Status: DC | PRN

## 2020-12-02 MED ORDER — EPHEDRINE 5 MG/ML INJ
INTRAVENOUS | Status: AC
  Filled 2020-12-02: qty 10

## 2020-12-02 MED ORDER — SUGAMMADEX SODIUM 200 MG/2ML IV SOLN
INTRAVENOUS | Status: DC | PRN
  Administered 2020-12-02: 200 mg via INTRAVENOUS
  Administered 2020-12-02: 50 mg via INTRAVENOUS

## 2020-12-02 MED ORDER — LIDOCAINE HCL (PF) 2 % IJ SOLN
INTRAMUSCULAR | Status: AC
  Filled 2020-12-02: qty 5

## 2020-12-02 MED ORDER — DEXAMETHASONE SODIUM PHOSPHATE 10 MG/ML IJ SOLN
INTRAMUSCULAR | Status: AC
  Filled 2020-12-02: qty 1

## 2020-12-02 MED ORDER — PHENYLEPHRINE HCL (PRESSORS) 10 MG/ML IV SOLN
INTRAVENOUS | Status: DC | PRN
  Administered 2020-12-02 (×4): 100 ug via INTRAVENOUS

## 2020-12-02 MED ORDER — SODIUM CHLORIDE 0.9 % IV SOLN: Freq: Once | INTRAVENOUS | Status: AC

## 2020-12-02 MED ORDER — EPHEDRINE SULFATE 50 MG/ML IJ SOLN
INTRAMUSCULAR | Status: DC | PRN
  Administered 2020-12-02 (×3): 5 mg via INTRAVENOUS

## 2020-12-02 MED ORDER — CHLORHEXIDINE GLUCONATE 0.12 % MT SOLN
OROMUCOSAL | Status: AC
  Administered 2020-12-02: 15 mL via OROMUCOSAL
  Filled 2020-12-02: qty 15

## 2020-12-02 NOTE — Interval H&P Note (Signed)
History and Physical Interval Note:  12/02/2020 12:30 PM  Kathryn Ware  has presented today for surgery, with the diagnosis of LEFT UPPER LOBE LUNG NODULE.  The various methods of treatment have been discussed with the patient and family. After consideration of risks, benefits and other options for treatment, the patient has consented to  Procedure(s): [name] (Left) as a surgical intervention.  The patient's history has been reviewed, patient examined, no change in status, stable for surgery.  I have reviewed the patient's chart and labs.  Questions were answered to the patient's satisfaction.     Vernard Gambles

## 2020-12-02 NOTE — Anesthesia Procedure Notes (Signed)
Procedure Name: Intubation Date/Time: 12/02/2020 12:43 PM Performed by: Lily Peer, Alyona Romack, CRNA Pre-anesthesia Checklist: Patient identified, Emergency Drugs available, Suction available and Patient being monitored Patient Re-evaluated:Patient Re-evaluated prior to induction Oxygen Delivery Method: Circle system utilized Preoxygenation: Pre-oxygenation with 100% oxygen Induction Type: IV induction Ventilation: Mask ventilation without difficulty Laryngoscope Size: McGraph and 3 Grade View: Grade I Tube type: Oral Tube size: 8.0 mm Number of attempts: 1 Airway Equipment and Method: Stylet Placement Confirmation: ETT inserted through vocal cords under direct vision,  positive ETCO2 and breath sounds checked- equal and bilateral Secured at: 20 cm Tube secured with: Tape Dental Injury: Teeth and Oropharynx as per pre-operative assessment

## 2020-12-02 NOTE — Anesthesia Preprocedure Evaluation (Signed)
Anesthesia Evaluation  Patient identified by MRN, date of birth, ID band Patient awake    Reviewed: Allergy & Precautions, H&P , NPO status , Patient's Chart, lab work & pertinent test results  History of Anesthesia Complications Negative for: history of anesthetic complications  Airway Mallampati: III  TM Distance: >3 FB Neck ROM: limited    Dental  (+) Missing   Pulmonary neg shortness of breath, COPD, Current Smoker and Patient abstained from smoking.,    Pulmonary exam normal        Cardiovascular Exercise Tolerance: Good hypertension, (-) angina+ CAD, + Peripheral Vascular Disease and +CHF  (-) Past MI and (-) DOE Normal cardiovascular exam     Neuro/Psych PSYCHIATRIC DISORDERS CVA, Residual Symptoms negative psych ROS   GI/Hepatic Neg liver ROS, GERD  Medicated and Controlled,  Endo/Other  diabetes, Type 2  Renal/GU Renal disease     Musculoskeletal   Abdominal   Peds  Hematology negative hematology ROS (+)   Anesthesia Other Findings Past Medical History: No date: Anemia No date: Aortic atherosclerosis (HCC) No date: Blood transfusion without reported diagnosis No date: CAD (coronary artery disease) No date: CHF (congestive heart failure) (HCC) No date: CKD (chronic kidney disease), stage III (HCC) No date: Diabetes mellitus type II No date: GERD (gastroesophageal reflux disease) No date: GI (gastrointestinal bleed) No date: GI AVM (gastrointestinal arteriovenous vascular malformation)     Comment:  stomach 12/2013: History of echocardiogram     Comment:  a. 12/2013: EF 60-65%, DD, mild LVH, nl RV size &               systolic function, mild MR/TR, mild AS, nl RVSP; b. TTE               04/2017: EF 60-65%, normal wall motion, GR1DD, mild MR,               left atrium normal in size, normal RV systolic function,               PASP normal  No date: History of nuclear stress test     Comment:  a. 12/2013:  low risk, no sig WMA, nondiag EKG 2/2               baseline LVH w/ repol abnl, no sig ischemia, EF 70% no               artifact No date: HLD (hyperlipidemia) No date: HTN (hypertension) No date: Hypercholesterolemia No date: IDA (iron deficiency anemia) No date: Mild cognitive impairment No date: PAD (peripheral artery disease) (HCC) No date: Polyp of colon, adenomatous No date: RLS (restless legs syndrome) 2021: TIA (transient ischemic attack)  Past Surgical History: 11/2001: ABDOMINAL ADHESION SURGERY     Comment:  Dr. Tamala Julian 1976: ABDOMINAL HYSTERECTOMY     Comment:  RSO (fibroid) 07/07/2016: COLONOSCOPY WITH PROPOFOL; N/A     Comment:  Procedure: COLONOSCOPY WITH PROPOFOL;  Surgeon: Manya Silvas, MD;  Location: [facility] ENDOSCOPY;  Service:               Endoscopy;  Laterality: N/A; 12/14/2016: COLONOSCOPY WITH PROPOFOL; N/A     Comment:  Procedure: COLONOSCOPY WITH PROPOFOL;  Surgeon: Manya Silvas, MD;  Location: [facility] ENDOSCOPY;  Service:  Endoscopy;  Laterality: N/A; 11/10: DOBUTAMINE STRESS ECHO     Comment:  normal No date: DOPPLER ECHOCARDIOGRAPHY     Comment:  EF 55%, mild MR, TR 06/08/2018: ENTEROSCOPY; N/A     Comment:  Procedure: ENTEROSCOPY;  Surgeon: Lin Landsman,               MD;  Location: [place];  Service:               Gastroenterology;  Laterality: N/A; 01/06/2020: ENTEROSCOPY; N/A     Comment:  Procedure: ENTEROSCOPY;  Surgeon: Lin Landsman,               MD;  Location: [facility] ENDOSCOPY;  Service:               Gastroenterology;  Laterality: N/A; 08/22/2020: ENTEROSCOPY; N/A     Comment:  Procedure: ENTEROSCOPY;  Surgeon: [name], MD;                Location: [facility] ENDOSCOPY;  Service: Gastroenterology;                Laterality: N/A; 07/2006: ESOPHAGOGASTRODUODENOSCOPY     Comment:  multiple angioectasias 03/12/2015: ESOPHAGOGASTRODUODENOSCOPY; Left     Comment:  Procedure: place tube in  throat and evaluate stomach and              duodenum for source of bleeding. Cauterize if concern for              rebleeding.;  Surgeon: Hulen Luster, MD;  Location: [facility]               ENDOSCOPY;  Service: Endoscopy;  Laterality: Left; 12/14/2016: ESOPHAGOGASTRODUODENOSCOPY; N/A     Comment:  Procedure: ESOPHAGOGASTRODUODENOSCOPY (EGD);  Surgeon:               Manya Silvas, MD;  Location: [facility] ENDOSCOPY;                Service: Endoscopy;  Laterality: N/A; 07/19/2017: ESOPHAGOGASTRODUODENOSCOPY; N/A     Comment:  Procedure: ESOPHAGOGASTRODUODENOSCOPY (EGD);  Surgeon:               Lin Landsman, MD;  Location: [facility] ENDOSCOPY;                Service: Gastroenterology;  Laterality: N/A; 11/23/2015: ESOPHAGOGASTRODUODENOSCOPY (EGD) WITH PROPOFOL; N/A     Comment:  Procedure: ESOPHAGOGASTRODUODENOSCOPY (EGD) WITH               PROPOFOL;  Surgeon: Hulen Luster, MD;  Location: [facility]               ENDOSCOPY;  Service: Gastroenterology;  Laterality: N/A; 07/07/2016: ESOPHAGOGASTRODUODENOSCOPY (EGD) WITH PROPOFOL; N/A     Comment:  Procedure: ESOPHAGOGASTRODUODENOSCOPY (EGD) WITH               PROPOFOL;  Surgeon: Manya Silvas, MD;  Location: [facility]              ENDOSCOPY;  Service: Endoscopy;  Laterality: N/A; 05/19/2017: ESOPHAGOGASTRODUODENOSCOPY (EGD) WITH PROPOFOL; N/A     Comment:  Procedure: ESOPHAGOGASTRODUODENOSCOPY (EGD) WITH               PROPOFOL;  Surgeon: Manya Silvas, MD;  Location:               [facility] ENDOSCOPY;  Service: Endoscopy;  Laterality: N/A; 06/26/2020: ESOPHAGOGASTRODUODENOSCOPY (EGD) WITH PROPOFOL; N/A     Comment:  Procedure: ESOPHAGOGASTRODUODENOSCOPY (EGD) WITH  PROPOFOL;  Surgeon: Lucilla Lame, MD;  Location: [facility]               ENDOSCOPY;  Service: Endoscopy;  Laterality: N/A; 10/2008: laryngeal polyp     Comment:  Dr. Tami Ribas No date: TOP     Reproductive/Obstetrics negative OB ROS                              Anesthesia Physical Anesthesia Plan  ASA: III  Anesthesia Plan: General ETT   Post-op Pain Management:    Induction: Intravenous  PONV Risk Score and Plan: Ondansetron, Dexamethasone, Midazolam and Treatment may vary due to age or medical condition  Airway Management Planned: Oral ETT  Additional Equipment:   Intra-op Plan:   Post-operative Plan: Extubation in OR  Informed Consent: I have reviewed the patients History and Physical, chart, labs and discussed the procedure including the risks, benefits and alternatives for the proposed anesthesia with the patient or authorized representative who has indicated his/her understanding and acceptance.     Dental Advisory Given  Plan Discussed with: Anesthesiologist, CRNA and Surgeon  Anesthesia Plan Comments: (Patient consented for risks of anesthesia including but not limited to:  - adverse reactions to medications - damage to eyes, teeth, lips or other oral mucosa - nerve damage due to positioning  - sore throat or hoarseness - Damage to heart, brain, nerves, lungs, other parts of body or loss of life  Patient voiced understanding.)        Anesthesia Quick Evaluation

## 2020-12-02 NOTE — Transfer of Care (Signed)
Immediate Anesthesia Transfer of Care Note  Patient: Kathryn Ware  Procedure(s) Performed: VIDEO BRONCHOSCOPY WITH ENDOBRONCHIAL NAVIGATION (Left )  Patient Location: PACU and Endoscopy Unit  Anesthesia Type:General  Level of Consciousness: awake, alert  and oriented  Airway & Oxygen Therapy: Patient Spontanous Breathing and Patient connected to face mask oxygen  Post-op Assessment: Report given to RN and Post -op Vital signs reviewed and stable  Post vital signs: Reviewed and stable  Last Vitals:  Vitals Value Taken Time  BP 150/55 12/02/20 1336  Temp    Pulse 94 12/02/20 1338  Resp 26 12/02/20 1338  SpO2 100 % 12/02/20 1338  Vitals shown include unvalidated device data.  Last Pain:  Vitals:   12/02/20 1127  TempSrc: Tympanic         Complications: No complications documented.

## 2020-12-02 NOTE — Discharge Instructions (Signed)
If you take aspirin you may resume in the morning.   AMBULATORY SURGERY  DISCHARGE INSTRUCTIONS   1) The drugs that you were given will stay in your system until tomorrow so for the next 24 hours you should not:  A) Drive an automobile B) Make any legal decisions C) Drink any alcoholic beverage   2) You may resume regular meals tomorrow.  Today it is better to start with liquids and gradually work up to solid foods.  You may eat anything you prefer, but it is better to start with liquids, then soup and crackers, and gradually work up to solid foods.   3) Please notify your doctor immediately if you have any unusual bleeding, trouble breathing, redness and pain at the surgery site, drainage, fever, or pain not relieved by medication.  4) Your post-operative visit with Dr.                                     is: Date:                        Time:    Please call to schedule your post-operative visit.  5) Additional Instructions:

## 2020-12-02 NOTE — Op Note (Signed)
Electromagnetic Navigation Bronchoscopy: Indication: Left upper lobe mass  Preoperative Diagnosis: Left upper lobe lung mass Post Procedure Diagnosis: Same as above Consent: Verbal/Written  Benefits, limitations and potential complications of the procedure were discussed with the patient/family  including, but not limited to bleeding, hemoptysis, respiratory failure requiring intubation and/or prolongued mechanical ventilation, infection, pneumothorax (collapse of lung) requiring chest tube placement, stroke  or even death.  Surgeon: Renold Don, MD Assistant/Scrub: Annia Belt, RRT Anesthesiologist/CRNA:  Katy Fitch, MD/Summer Lily Peer, CRNA ROSE available: Yes, [facility] Fluoroscopy technician: Rolin Barry, RT  Type of Anesthesia: General endotracheal   Description of Procedure: Procedure Performed: 1) virtual Bronchoscopy with Multi-planar Image analysis, 3-D reconstruction of coronal, sagittal and multi-planar images for the purposes of planning real-time bronchoscopy using the iLogic Electromagnetic Navigation Bronchoscopy System (superDimension).  Fluoroscopy as needed for sample acquisition.  Description of Procedure: The patient was taken to Procedure Room 2 (Bronchoscopy Suite) appropriate timeout was taken.  Patient was placed on the superDimension table.  Patient was then inducted under general anesthesia by the anesthesia team.  She was intubated with an 8.0 ETT without difficulty.  A Portex adapter was placed on the ETT flange.  At this point the Olympus video bronchoscope was advanced through the Portex adapter and anatomic tour of the airway was performed.  Patient had copious secretions throughout that were suctioned and lavaged until clear.  The visible trachea was normal, carina was sharp, inspection of the right lung showed no abnormalities or endobronchial lesions on the right upper lobe, right middle lobe and right lower lobe subsegments.  Inspection of the  left lung showed no endobronchial lesions, on left upper lobe, lingula and lower lobe subsegments.  At this point the superDimension extended working channel and locatable guide (LG) were advanced through the working channel of the bronchoscope.The catheter/LG combo was then placed into the central portion of the trachea. The LG was directed to standard registration points at the following centers: main carina, right upper lobe bronchus, right lower lobe bronchus, right middle lobe bronchus, left upper lobe bronchus, and the left lower lobe bronchus. This data was transferred to the i-Logic ENB system for real-time bronchoscopy.  Once registration was completed, the scope was advanced until it could go no further and then the extended working channel/LG combo was navigated to the LEFT upper lobe for tissue sampling.  The position of the LG was confirmed with fluoroscopy.  Once this was done LG probe was removed and transbronchial brushings were performed under fluoroscopic guidance.  ROSE revealed lesional cells, additional brushings were performed a total of 2 brushes were utilized for a total of 4 passes.  In similar fashion transbronchial biopsies were performed with superDimension biopsy forceps.  A total of 8 biopsy specimens were obtained of the LEFT upper lobe lesion.  After this was completed a targeted bronchoalveolar lavage was performed on the LEFT upper lobe, 40 mL of saline were instilled yielding approximately 8 mL of aliquot.  Once this was completed the airway was examined for hemostasis and the patient received 9 mL of 1% lidocaine via bronchial lavage prior to retrieving the bronchoscope.  Bronchoscope was then retrieved and the patient was allowed to emerge from general anesthesia, was extubated and was transferred to the PACU in satisfactory condition.  Patient tolerated procedure well.  No immediate complications noted.   Specimens Obtained:  Transbronchial brushings: X4 (total of 2 brushes  utilized), LEFT upper lobe  Transbronchial Forceps Biopsy: X8, LEFT upper lobe  Targeted BAL:  LEFT upper lobe, 40 mL saline in, mL aliquot.  Fluoroscopy:  Fluoroscopy was utilized during the course of this procedure to assure that biopsies were taken in a safe manner under fluoroscopic guidance with spot films required.  Total fluoroscopy time 2 minutes and 34 seconds.  Representative images as below:      Complications:None Postprocedural chest x-ray no pneumothorax:     Estimated Blood Loss: minimal approx less than 5 mL.   Assessment and Plan/Additional Comments: 1) LEFT upper lobe mass, carcinoma likely await pathology 2) Successful electro navigation bronchoscopy, status post biopsy   C. Derrill Kay, MD [place] [facility]   *This note was dictated using voice recognition software/Dragon.  Despite best efforts to proofread, errors can occur which can change the meaning.  Any change was purely unintentional.

## 2020-12-03 ENCOUNTER — Encounter: Payer: Self-pay | Admitting: Pulmonary Disease

## 2020-12-03 ENCOUNTER — Encounter: Payer: Self-pay | Admitting: *Deleted

## 2020-12-03 NOTE — Progress Notes (Signed)
  Oncology Nurse Navigator Documentation  Navigator Location: [facility] (12/03/20 1500)   )Navigator Encounter Type: Telephone (12/03/20 1500) Telephone: Lahoma Crocker Call;Appt Confirmation/Clarification (12/03/20 1500) Abnormal Finding Date: 10/05/20 (12/03/20 1500)                     Barriers/Navigation Needs: Coordination of Care (12/03/20 1500)   Interventions: Coordination of Care (12/03/20 1500)   Coordination of Care: Appts (12/03/20 1500)        Acuity: Level 2-Minimal Needs (1-2 Barriers Identified) (12/03/20 1500)    phone call made to patient to introduce to navigator services and review upcoming appts. Pt made aware that appt on 2/22 has been rescheduled to 2/17. Pt confirmed appt. All questions answered during call. Contact info given and instructed to call with any further questions or needs. Pt verbalized understanding.     Time Spent with Patient: 30 (12/03/20 1500)

## 2020-12-03 NOTE — Anesthesia Postprocedure Evaluation (Signed)
Anesthesia Post Note  Patient: Kathryn Ware  Procedure(s) Performed: VIDEO BRONCHOSCOPY WITH ENDOBRONCHIAL NAVIGATION (Left )  Patient location during evaluation: PACU Anesthesia Type: General Level of consciousness: awake and alert Pain management: pain level controlled Vital Signs Assessment: post-procedure vital signs reviewed and stable Respiratory status: spontaneous breathing, nonlabored ventilation, respiratory function stable and patient connected to nasal cannula oxygen Cardiovascular status: blood pressure returned to baseline and stable Postop Assessment: no apparent nausea or vomiting Anesthetic complications: no   No complications documented.   Last Vitals:  Vitals:   12/02/20 1430 12/02/20 1435  BP: 134/68 (!) 130/59  Pulse: 94 88  Resp: 16 15  Temp: 36.7 C 36.7 C  SpO2: 94% 95%    Last Pain:  Vitals:   12/03/20 1412  TempSrc:   PainSc: 0-No pain                 Precious Haws Tritia Endo

## 2020-12-04 ENCOUNTER — Other Ambulatory Visit: Payer: Self-pay | Admitting: Pathology

## 2020-12-04 LAB — CYTOLOGY - NON PAP

## 2020-12-04 LAB — SURGICAL PATHOLOGY

## 2020-12-07 ENCOUNTER — Other Ambulatory Visit: Payer: Medicare Other

## 2020-12-07 ENCOUNTER — Ambulatory Visit: Payer: Medicare Other

## 2020-12-07 ENCOUNTER — Ambulatory Visit: Payer: Medicare Other | Admitting: Internal Medicine

## 2020-12-10 ENCOUNTER — Inpatient Hospital Stay (HOSPITAL_BASED_OUTPATIENT_CLINIC_OR_DEPARTMENT_OTHER): Payer: Medicare Other | Admitting: Internal Medicine

## 2020-12-10 ENCOUNTER — Other Ambulatory Visit: Payer: Medicare Other

## 2020-12-10 ENCOUNTER — Encounter: Payer: Self-pay | Admitting: *Deleted

## 2020-12-10 ENCOUNTER — Encounter: Payer: Self-pay | Admitting: Internal Medicine

## 2020-12-10 DIAGNOSIS — K219 Gastro-esophageal reflux disease without esophagitis: Secondary | ICD-10-CM | POA: Diagnosis not present

## 2020-12-10 DIAGNOSIS — D509 Iron deficiency anemia, unspecified: Secondary | ICD-10-CM | POA: Diagnosis not present

## 2020-12-10 DIAGNOSIS — C3412 Malignant neoplasm of upper lobe, left bronchus or lung: Secondary | ICD-10-CM

## 2020-12-10 DIAGNOSIS — G2581 Restless legs syndrome: Secondary | ICD-10-CM | POA: Diagnosis not present

## 2020-12-10 DIAGNOSIS — Z803 Family history of malignant neoplasm of breast: Secondary | ICD-10-CM | POA: Diagnosis not present

## 2020-12-10 DIAGNOSIS — F17210 Nicotine dependence, cigarettes, uncomplicated: Secondary | ICD-10-CM | POA: Diagnosis not present

## 2020-12-10 DIAGNOSIS — Z8249 Family history of ischemic heart disease and other diseases of the circulatory system: Secondary | ICD-10-CM | POA: Diagnosis not present

## 2020-12-10 DIAGNOSIS — Z833 Family history of diabetes mellitus: Secondary | ICD-10-CM | POA: Diagnosis not present

## 2020-12-10 DIAGNOSIS — N183 Chronic kidney disease, stage 3 unspecified: Secondary | ICD-10-CM | POA: Diagnosis not present

## 2020-12-10 DIAGNOSIS — I509 Heart failure, unspecified: Secondary | ICD-10-CM | POA: Diagnosis not present

## 2020-12-10 DIAGNOSIS — Z79899 Other long term (current) drug therapy: Secondary | ICD-10-CM | POA: Diagnosis not present

## 2020-12-10 DIAGNOSIS — C3490 Malignant neoplasm of unspecified part of unspecified bronchus or lung: Secondary | ICD-10-CM

## 2020-12-10 DIAGNOSIS — E7800 Pure hypercholesterolemia, unspecified: Secondary | ICD-10-CM | POA: Diagnosis not present

## 2020-12-10 DIAGNOSIS — E785 Hyperlipidemia, unspecified: Secondary | ICD-10-CM | POA: Diagnosis not present

## 2020-12-10 DIAGNOSIS — I130 Hypertensive heart and chronic kidney disease with heart failure and stage 1 through stage 4 chronic kidney disease, or unspecified chronic kidney disease: Secondary | ICD-10-CM | POA: Diagnosis not present

## 2020-12-10 DIAGNOSIS — Z8673 Personal history of transient ischemic attack (TIA), and cerebral infarction without residual deficits: Secondary | ICD-10-CM | POA: Diagnosis not present

## 2020-12-10 DIAGNOSIS — E1122 Type 2 diabetes mellitus with diabetic chronic kidney disease: Secondary | ICD-10-CM | POA: Diagnosis not present

## 2020-12-10 DIAGNOSIS — Z7984 Long term (current) use of oral hypoglycemic drugs: Secondary | ICD-10-CM | POA: Diagnosis not present

## 2020-12-10 DIAGNOSIS — I2510 Atherosclerotic heart disease of native coronary artery without angina pectoris: Secondary | ICD-10-CM | POA: Diagnosis not present

## 2020-12-10 NOTE — Progress Notes (Signed)
  Oncology Nurse Navigator Documentation  Navigator Location: [facility] (12/10/20 1400)   )Navigator Encounter Type: Follow-up Appt (12/10/20 1400)     Confirmed Diagnosis Date: 12/04/20 (12/10/20 1400)               Patient Visit Type: MedOnc (12/10/20 1400) Treatment Phase: Pre-Tx/Tx Discussion (12/10/20 1400) Barriers/Navigation Needs: Coordination of Care (12/10/20 1400)   Interventions: Coordination of Care;Referrals (12/10/20 1400) Referrals: Radiation Oncology (12/10/20 1400) Coordination of Care: Appts (12/10/20 1400)        Acuity: Level 2-Minimal Needs (1-2 Barriers Identified) (12/10/20 1400)  met with patient during follow up visit with Dr. Rogue Bussing to discuss recent biopsy results and treatment options. All questions answered during visit. Reviewed upcoming appts and informed that will be notified once scheduled for PFT's. Pt given resources regarding diagnosis and supportive services available. Contact info given and instructed to call with any further questions or needs. Pt verbalized understanding.       Time Spent with Patient: 60 (12/10/20 1400)

## 2020-12-10 NOTE — Assessment & Plan Note (Addendum)
#  Left upper lobe stage I non-small cell favor adenocarcinoma.  I reviewed the pathology/stage with the patient family in detail.  #Given stage I cancer reviewed the option of surgery; thoracic surgery evaluation.  However patient declines.  Alternative to surgery-discussed option of radiation/SBRT.  Recommend evaluation with Dr. Donella Stade.  Check PFTs.  Discussed at tumor conference.  # right parotid uptake/incidental-recommend evaluation with ENT; subsequent visit.  # Iron deficiency anemia sec to chronic GI bleed [AVMs] most recent February Hb 11.6; continue PO iron.    # Active smoker-again counseled the patient to quit smoking.  # DISPOSITION: # referral to Dr.Crystal # follow up in 3 months; MD; labs- cbc/cmp; possible venofer Dr- Dr.B

## 2020-12-10 NOTE — Progress Notes (Signed)
Tumor Board Documentation  Kathryn Ware was presented by Dr Rogue Bussing at our Tumor Board on 12/10/2020, which included representatives from medical oncology,radiation oncology,surgical,radiology,pathology,navigation,internal medicine,palliative care,research,genetics,pharmacy.  Kathryn Ware currently presents as a current patient,for MDC,for new positive pathology with history of the following treatments: active survellience,surgical intervention(s).  Additionally, we reviewed previous medical and familial history, history of present illness, and recent lab results along with all available histopathologic and imaging studies. The tumor board considered available treatment options and made the following recommendations: Radiation therapy (primary modality) Patient refused surgery  The following procedures/referrals were also placed: No orders of the defined types were placed in this encounter.   Clinical Trial Status: not discussed   Staging used: Clinical Stage  AJCC Staging: T: 1 N: 0   Group: Stage IA Adenicarcinoma of Lung   National site-specific guidelines NCCN were discussed with respect to the case.  Tumor board is a meeting of clinicians from various specialty areas who evaluate and discuss patients for whom a multidisciplinary approach is being considered. Final determinations in the plan of care are those of the provider(s). The responsibility for follow up of recommendations given during tumor board is that of the provider.   Today's extended care, comprehensive team conference, Kathryn Ware was not present for the discussion and was not examined.   Multidisciplinary Tumor Board is a multidisciplinary case peer review process.  Decisions discussed in the Multidisciplinary Tumor Board reflect the opinions of the specialists present at the conference without having examined the patient.  Ultimately, treatment and diagnostic decisions rest with the primary provider(s) and the patient.

## 2020-12-10 NOTE — Progress Notes (Signed)
[facility] CONSULT NOTE  Patient Care Team: Venia Carbon, MD as PCP - General Telford Nab, RN as Oncology Nurse Navigator  CHIEF COMPLAINTS/PURPOSE OF CONSULTATION: lung cancer  #  Oncology History Overview Note  1. Unchanged spiculated mass of the peripheral left upper lobe measuring 2.6 x 1.7 cm, previously FDG avid. Findings remain consistent with primary lung malignancy. 2. Unchanged 3 mm subpleural nodule of the posterior right upper lobe, nonspecific, although statistically likely to be benign sequelae of prior infection or inflammation. 3. Moderate to severe centrilobular emphysema. 4. Coronary artery disease.  LUNG, LEFT UPPER LOBE; ENB-ASSISTED BIOPSY:  - NON-SMALL CELL CARCINOMA, FAVOR ADENOCARCINOMA.   # FEB 2022- Stage IA- adeno ca; LUL [s/p Bronc; Dr.G]; DECLINES SURGERY.   There is sufficient material for limited ancillary molecular studies.   # # SURVIVORSHIP:   # GENETICS:   DIAGNOSIS: LUL LUNG CA  STAGE:  I       ;  GOALS: curative/control  CURRENT/MOST RECENT THERAPY :  SBRT   Primary cancer of left upper lobe of lung ([place])  12/10/2020 Initial Diagnosis   Primary cancer of left upper lobe of lung ([place])   12/10/2020 Cancer Staging   Staging form: Lung, AJCC 8th Edition - Clinical stage from 12/10/2020: Stage IA3 (cT1c, cN0, cM0) - Signed by Cammie Sickle, MD on 12/10/2020 Histopathologic type: Adenocarcinoma, NOS Stage prefix: Initial diagnosis Histologic grade (G): GX Histologic grading system: 4 grade system      HISTORY OF PRESENTING ILLNESS:  Kathryn Ware 80 y.o.  female history of smoking the recent abnormal CT scan showing left upper lobe lung nodule status post biopsy is here for follow-up.  Patient underwent bronchoscopy, biopsy was uneventful.  Patient is here to discuss the treatment options/plan of care.   Review of Systems  Constitutional: Positive for malaise/fatigue. Negative for chills, diaphoresis,  fever and weight loss.  HENT: Negative for nosebleeds and sore throat.   Eyes: Negative for double vision.  Respiratory: Positive for shortness of breath. Negative for cough, hemoptysis, sputum production and wheezing.   Cardiovascular: Negative for chest pain, palpitations, orthopnea and leg swelling.  Gastrointestinal: Negative for abdominal pain, blood in stool, constipation, diarrhea, heartburn, melena, nausea and vomiting.  Genitourinary: Negative for dysuria, frequency and urgency.  Musculoskeletal: Positive for back pain and joint pain.  Skin: Negative.  Negative for itching and rash.  Neurological: Negative for dizziness, tingling, focal weakness, weakness and headaches.  Endo/Heme/Allergies: Does not bruise/bleed easily.  Psychiatric/Behavioral: Negative for depression. The patient is not nervous/anxious and does not have insomnia.      MEDICAL HISTORY:  Past Medical History:  Diagnosis Date  . Anemia   . Aortic atherosclerosis ([place])   . Blood transfusion without reported diagnosis   . CAD (coronary artery disease)   . CHF (congestive heart failure) ([place])   . CKD (chronic kidney disease), stage III ([place])   . Diabetes mellitus type II   . GERD (gastroesophageal reflux disease)   . GI (gastrointestinal bleed)   . GI AVM (gastrointestinal arteriovenous vascular malformation)    stomach  . History of echocardiogram 12/2013   a. 12/2013: EF 60-65%, DD, mild LVH, nl RV size & systolic function, mild MR/TR, mild AS, nl RVSP; b. TTE 04/2017: EF 60-65%, normal wall motion, GR1DD, mild MR, left atrium normal in size, normal RV systolic function, PASP normal   . History of nuclear stress test    a. 12/2013: low risk, no sig WMA, nondiag  EKG 2/2 baseline LVH w/ repol abnl, no sig ischemia, EF 70% no artifact  . HLD (hyperlipidemia)   . HTN (hypertension)   . Hypercholesterolemia   . IDA (iron deficiency anemia)   . Mild cognitive impairment   . PAD (peripheral artery disease) ([place])    . Polyp of colon, adenomatous   . RLS (restless legs syndrome)   . TIA (transient ischemic attack) 2021    SURGICAL HISTORY: Past Surgical History:  Procedure Laterality Date  . ABDOMINAL ADHESION SURGERY  11/2001   Dr. Tamala Julian  . ABDOMINAL HYSTERECTOMY  1976   RSO (fibroid)  . COLONOSCOPY WITH PROPOFOL N/A 07/07/2016   Procedure: COLONOSCOPY WITH PROPOFOL;  Surgeon: Manya Silvas, MD;  Location: [facility] ENDOSCOPY;  Service: Endoscopy;  Laterality: N/A;  . COLONOSCOPY WITH PROPOFOL N/A 12/14/2016   Procedure: COLONOSCOPY WITH PROPOFOL;  Surgeon: Manya Silvas, MD;  Location: [facility] ENDOSCOPY;  Service: Endoscopy;  Laterality: N/A;  . DOBUTAMINE STRESS ECHO  11/10   normal  . DOPPLER ECHOCARDIOGRAPHY     EF 55%, mild MR, TR  . ENTEROSCOPY N/A 06/08/2018   Procedure: ENTEROSCOPY;  Surgeon: Lin Landsman, MD;  Location: [facility] ENDOSCOPY;  Service: Gastroenterology;  Laterality: N/A;  . ENTEROSCOPY N/A 01/06/2020   Procedure: ENTEROSCOPY;  Surgeon: Lin Landsman, MD;  Location: [facility] ENDOSCOPY;  Service: Gastroenterology;  Laterality: N/A;  . ENTEROSCOPY N/A 08/22/2020   Procedure: ENTEROSCOPY;  Surgeon: [name], MD;  Location: [facility] ENDOSCOPY;  Service: Gastroenterology;  Laterality: N/A;  . ESOPHAGOGASTRODUODENOSCOPY  07/2006   multiple angioectasias  . ESOPHAGOGASTRODUODENOSCOPY Left 03/12/2015   Procedure: place tube in throat and evaluate stomach and duodenum for source of bleeding. Cauterize if concern for rebleeding.;  Surgeon: Hulen Luster, MD;  Location: [facility] ENDOSCOPY;  Service: Endoscopy;  Laterality: Left;  . ESOPHAGOGASTRODUODENOSCOPY N/A 12/14/2016   Procedure: ESOPHAGOGASTRODUODENOSCOPY (EGD);  Surgeon: Manya Silvas, MD;  Location: [facility] ENDOSCOPY;  Service: Endoscopy;  Laterality: N/A;  . ESOPHAGOGASTRODUODENOSCOPY N/A 07/19/2017   Procedure: ESOPHAGOGASTRODUODENOSCOPY (EGD);  Surgeon: Lin Landsman, MD;  Location: [facility] ENDOSCOPY;  Service: Gastroenterology;   Laterality: N/A;  . ESOPHAGOGASTRODUODENOSCOPY (EGD) WITH PROPOFOL N/A 11/23/2015   Procedure: ESOPHAGOGASTRODUODENOSCOPY (EGD) WITH PROPOFOL;  Surgeon: Hulen Luster, MD;  Location: [facility] ENDOSCOPY;  Service: Gastroenterology;  Laterality: N/A;  . ESOPHAGOGASTRODUODENOSCOPY (EGD) WITH PROPOFOL N/A 07/07/2016   Procedure: ESOPHAGOGASTRODUODENOSCOPY (EGD) WITH PROPOFOL;  Surgeon: Manya Silvas, MD;  Location: [facility] ENDOSCOPY;  Service: Endoscopy;  Laterality: N/A;  . ESOPHAGOGASTRODUODENOSCOPY (EGD) WITH PROPOFOL N/A 05/19/2017   Procedure: ESOPHAGOGASTRODUODENOSCOPY (EGD) WITH PROPOFOL;  Surgeon: Manya Silvas, MD;  Location: [facility] ENDOSCOPY;  Service: Endoscopy;  Laterality: N/A;  . ESOPHAGOGASTRODUODENOSCOPY (EGD) WITH PROPOFOL N/A 06/26/2020   Procedure: ESOPHAGOGASTRODUODENOSCOPY (EGD) WITH PROPOFOL;  Surgeon: Lucilla Lame, MD;  Location: [facility] ENDOSCOPY;  Service: Endoscopy;  Laterality: N/A;  . laryngeal polyp  10/2008   Dr. Tami Ribas  . TOP    . VIDEO BRONCHOSCOPY WITH ENDOBRONCHIAL NAVIGATION Left 12/02/2020   Procedure: VIDEO BRONCHOSCOPY WITH ENDOBRONCHIAL NAVIGATION;  Surgeon: Tyler Pita, MD;  Location: [facility] ORS;  Service: Pulmonary;  Laterality: Left;    SOCIAL HISTORY: Social History   Socioeconomic History  . Marital status: Widowed    Spouse name: Not on file  . Number of children: 1  . Years of education: Not on file  . Highest education level: Not on file  Occupational History  . Occupation: Regulatory affairs officer    Comment: retired  Tobacco Use  . Smoking status: Current  Every Day Smoker    Packs/day: 1.00    Years: 63.00    Pack years: 63.00    Types: Cigarettes  . Smokeless tobacco: Never Used  Vaping Use  . Vaping Use: Never used  Substance and Sexual Activity  . Alcohol use: No  . Drug use: No  . Sexual activity: Not on file  Other Topics Concern  . Not on file  Social History Narrative   Lives with sister; in [place] since 36 years; 1/2 ppd; no alcohol.  Used to work in [place].       No living will   Requests son Orson Gear as health care POA   Would accept resuscitation --but no prolonged machines   Not sure about feeding tubes   Social Determinants of Health   Financial Resource Strain: Not on file  Food Insecurity: Not on file  Transportation Needs: Not on file  Physical Activity: Not on file  Stress: Not on file  Social Connections: Not on file  Intimate Partner Violence: Not on file    FAMILY HISTORY: Family History  Problem Relation Age of Onset  . Stroke Father   . Cancer Sister        breast cancer  . Hypertension Mother   . Hypertension Other        sibling  . Diabetes Other        grandmother    ALLERGIES:  is allergic to nifedipine and metformin.  MEDICATIONS:  Current Outpatient Medications  Medication Sig Dispense Refill  . albuterol (VENTOLIN HFA) 108 (90 Base) MCG/ACT inhaler INHALE 2 PUFFS INTO THE LUNGS EVERY 6 HOURS AS NEEDED FOR WHEEZING OR SHORTNESS OF BREATH (Patient taking differently: Inhale 2 puffs into the lungs every 6 (six) hours as needed. INHALE 2 PUFFS INTO THE LUNGS EVERY 6 HOURS AS NEEDED FOR WHEEZING OR SHORTNESS OF BREATH) 8.5 g 0  . clonazePAM (KLONOPIN) 0.5 MG tablet TAKE 1 TO 2 TABLETS(0.5 TO 1 MG) BY MOUTH AT BEDTIME AS NEEDED FOR RESTLESS LEGS (Patient taking differently: Take 1 mg by mouth at bedtime.) 60 tablet 0  . ferrous sulfate 325 (65 FE) MG tablet Take 325 mg by mouth daily with breakfast.    . glucose blood (ONETOUCH VERIO) test strip Use to check blood sugar once daily E11.69 100 each 12  . lisinopril-hydrochlorothiazide (ZESTORETIC) 20-12.5 MG tablet Take 1 tablet by mouth at bedtime.    . metFORMIN (GLUCOPHAGE-XR) 500 MG 24 hr tablet Take 500 mg by mouth daily with breakfast.    . omeprazole (PRILOSEC) 40 MG capsule Take 1 capsule (40 mg total) by mouth in the morning and at bedtime. 180 capsule 3  . OneTouch Delica Lancets 76B MISC 1 each by Does not apply route daily.  Use to obtain blood sugar sample daily E11.69 100 each 12  . simvastatin (ZOCOR) 80 MG tablet TAKE 1 TABLET(80 MG) BY MOUTH DAILY (Patient taking differently: Take 80 mg by mouth every morning. TAKE 1 TABLET(80 MG) BY MOUTH DAILY) 90 tablet 0  . vitamin B-12 (CYANOCOBALAMIN) 1000 MCG tablet Take 1,000 mcg by mouth daily.     No current facility-administered medications for this visit.      Marland Kitchen  PHYSICAL EXAMINATION: ECOG PERFORMANCE STATUS: 1 - Symptomatic but completely ambulatory  Vitals:   12/10/20 0950  BP: 136/62  Pulse: 87  Resp: 16  Temp: (!) 94.9 F (34.9 C)  SpO2: 100%   Filed Weights   12/10/20 0950  Weight: 94 lb 6.4  oz (42.8 kg)    Physical Exam Constitutional:      Comments: Ambulating independently.  Accompanied by daughter.  HENT:     Head: Normocephalic and atraumatic.     Mouth/Throat:     Mouth: Oropharynx is clear and moist.     Pharynx: No oropharyngeal exudate.  Eyes:     Pupils: Pupils are equal, round, and reactive to light.  Cardiovascular:     Rate and Rhythm: Normal rate and regular rhythm.  Pulmonary:     Effort: No respiratory distress.     Breath sounds: No wheezing.     Comments: Decreased air entry bilaterally.  No wheeze or crackles. Abdominal:     General: Bowel sounds are normal. There is no distension.     Palpations: Abdomen is soft. There is no mass.     Tenderness: There is no abdominal tenderness. There is no guarding or rebound.  Musculoskeletal:        General: No tenderness or edema. Normal range of motion.     Cervical back: Normal range of motion and neck supple.  Skin:    General: Skin is warm.  Neurological:     Mental Status: She is alert and oriented to person, place, and time.  Psychiatric:        Mood and Affect: Affect normal.      LABORATORY DATA:  I have reviewed the data as listed Lab Results  Component Value Date   WBC 4.3 11/25/2020   HGB 11.2 (L) 11/25/2020   HCT 35.9 (L) 11/25/2020   MCV 84.5  11/25/2020   PLT 243 11/25/2020   Recent Labs    01/05/20 0843 01/06/20 0710 06/24/20 1702 06/25/20 0500 06/26/20 0335 06/30/20 1254 08/22/20 0306 08/23/20 0407 09/01/20 1146 11/25/20 1126  NA 137   < > 141 140 140 142 138 139 140 139  K 4.6   < > 4.6 4.1 3.6 4.7 4.8 4.2 4.5 3.8  CL 107   < > 109 111 113* 106 108 110 104 104  CO2 17*   < > 19* 20* 19* 28 21* 23 30 24   GLUCOSE 258*   < > 151* 91 74 105* 199* 116* 92 81  BUN 60*   < > 41* 40* 24* 9 36* 19 14 14   CREATININE 1.49*   < > 1.25* 1.15* 0.89 0.97 1.26* 0.88 0.96 0.89  CALCIUM 9.1   < > 10.2 9.0 8.5* 10.4 9.3 9.0 9.9 10.0  GFRNONAA 34*   < > 41* 46* >60  --  44* >60  --  >60  GFRAA 39*   < > 48* 53* >60  --   --   --   --   --   PROT 5.6*  --   --   --   --   --  6.9  --   --   --   ALBUMIN 3.0*   < >  --   --   --  4.1 3.9  --  4.2  --   AST 20  --   --   --   --   --  24  --   --   --   ALT 10  --   --   --   --   --  8  --   --   --   ALKPHOS 29*  --   --   --   --   --  40  --   --   --  BILITOT 0.5  --   --   --   --   --  0.6  --   --   --    < > = values in this interval not displayed.    RADIOGRAPHIC STUDIES: I have personally reviewed the radiological images as listed and agreed with the findings in the report. NM PET Image Initial (PI) Skull Base To Thigh  Result Date: 11/13/2020 CLINICAL DATA:  Initial treatment strategy for lung nodule. EXAM: NUCLEAR MEDICINE PET SKULL BASE TO THIGH TECHNIQUE: 5.68 mCi F-18 FDG was injected intravenously. Full-ring PET imaging was performed from the skull base to thigh after the radiotracer. CT data was obtained and used for attenuation correction and anatomic localization. Fasting blood glucose: 84 mg/dl COMPARISON:  Lung cancer screening 10/05/2020 FINDINGS: Mediastinal blood pool activity: SUV max 2.24 Liver activity: SUV max NA NECK: FDG avid soft tissue nodule within the right parotid gland measures 1.5 cm within SUV max of 9.85, image 27/3. No FDG avid lymph nodes  within the soft tissues of the neck. Incidental CT findings: none CHEST: Subpleural nodule within the lateral left apex measures 2.2 cm and has an SUV max of 9.22, image 56/3. The irregular, indistinct peripheral right upper lobe lung nodule measuring 9 mm has an SUV max of 0.74, image 66/3. No FDG avid axillary, supraclavicular, mediastinal, or hilar lymph nodes. Incidental CT findings: Moderate to advanced upper lobe predominant centrilobular and paraseptal emphysema. Aortic atherosclerosis. Coronary artery calcifications. ABDOMEN/PELVIS: No abnormal hypermetabolic activity within the liver, pancreas, adrenal glands, or spleen. No hypermetabolic lymph nodes in the abdomen or pelvis. Incidental CT findings: Aortic atherosclerosis. Small stones noted within the dependent portion of the gallbladder, image 157/3. Asymmetric atrophy of the right kidney. Small calcified uterine fibroids. SKELETON: No focal hypermetabolic activity to suggest skeletal metastasis. Incidental CT findings: none IMPRESSION: 1. The spiculated peripheral apical left upper lobe pulmonary nodule is FDG avid and highly concerning for primary bronchogenic carcinoma. No signs of FDG avid nodal or distant metastatic disease. 2. Soft tissue nodule within the right parotid gland is FDG avid and concerning for primary parotid neoplasm such as pleomorphic adenoma. Referral to otolaryngology advise for further management. 3. Aortic Atherosclerosis (ICD10-I70.0) and Emphysema (ICD10-J43.9). Coronary artery calcifications. Electronically Signed   By: Kerby Moors M.D.   On: 11/13/2020 08:56   DG Chest Port 1 View  Result Date: 12/02/2020 CLINICAL DATA:  Status post left upper lobe bronchoscopy. EXAM: PORTABLE CHEST 1 VIEW COMPARISON:  January 05, 2020. FINDINGS: The heart size and mediastinal contours are within normal limits. No pneumothorax or pleural effusion is noted. Right lung is clear. Peripheral spiculated mass is noted in left upper lobe  concerning for malignancy. Slightly increased left apical density is noted concerning for contusion secondary to biopsy. The visualized skeletal structures are unremarkable. IMPRESSION: No pneumothorax status post left upper lobe biopsy. Peripheral spiculated mass is noted in left upper lobe concerning for malignancy. Slightly increased left apical density is noted concerning for contusion secondary to biopsy. CT scan of the chest may be performed for further evaluation. Aortic Atherosclerosis (ICD10-I70.0). Electronically Signed   By: Marijo Conception M.D.   On: 12/02/2020 13:56   DG C-Arm 1-60 Min-No Report  Result Date: 12/02/2020 Fluoroscopy was utilized by the requesting physician.  No radiographic interpretation.   CT Super D Chest Wo Contrast  Result Date: 11/25/2020 CLINICAL DATA:  Bronchoscopic planning, follow-up PET-CT 11/12/2020 EXAM: CT CHEST WITHOUT CONTRAST TECHNIQUE: Multidetector CT imaging of the  chest was performed using thin slice collimation for electromagnetic bronchoscopy planning purposes, without intravenous contrast. COMPARISON:  None. FINDINGS: Cardiovascular: Aortic atherosclerosis. Normal heart size. Three-vessel coronary artery calcifications. No pericardial effusion. Mediastinum/Nodes: No enlarged mediastinal, hilar, or axillary lymph nodes. Thyroid gland, trachea, and esophagus demonstrate no significant findings. Lungs/Pleura: Moderate to severe centrilobular emphysema. Unchanged spiculated mass of the peripheral left upper lobe measuring 2.6 x 1.7 cm, previously FDG avid. Unchanged 3 mm subpleural nodule of the posterior right upper lobe (series 3, image 43). Minimal bandlike scarring and or atelectasis of the peripheral right upper lobe (series 3, image 46). No pleural effusion or pneumothorax. Upper Abdomen: No acute abnormality. Asymmetrically atrophic right kidney in the included upper abdomen. Musculoskeletal: No chest wall mass or suspicious bone lesions identified.  IMPRESSION: 1. Unchanged spiculated mass of the peripheral left upper lobe measuring 2.6 x 1.7 cm, previously FDG avid. Findings remain consistent with primary lung malignancy. 2. Unchanged 3 mm subpleural nodule of the posterior right upper lobe, nonspecific, although statistically likely to be benign sequelae of prior infection or inflammation. 3. Moderate to severe centrilobular emphysema. 4. Coronary artery disease. Aortic Atherosclerosis (ICD10-I70.0) and Emphysema (ICD10-J43.9). Electronically Signed   By: Eddie Candle M.D.   On: 11/25/2020 11:31    ASSESSMENT & PLAN:   Primary cancer of left upper lobe of lung (HCC) #Left upper lobe stage I non-small cell favor adenocarcinoma.  I reviewed the pathology/stage with the patient family in detail.  #Given stage I cancer reviewed the option of surgery; thoracic surgery evaluation.  However patient declines.  Alternative to surgery-discussed option of radiation/SBRT.  Recommend evaluation with Dr. Donella Stade.  Check PFTs.  Discussed at tumor conference.  # right parotid uptake/incidental-recommend evaluation with ENT; subsequent visit.  # Iron deficiency anemia sec to chronic GI bleed [AVMs] most recent February Hb 11.6; continue PO iron.    # Active smoker-again counseled the patient to quit smoking.  # DISPOSITION: # referral to Dr.Crystal # follow up in 3 months; MD; labs- cbc/cmp; possible venofer Dr- Dr.B    All questions were answered. The patient knows to call the clinic with any problems, questions or concerns.    Cammie Sickle, MD 12/10/2020 12:50 PM

## 2020-12-11 ENCOUNTER — Other Ambulatory Visit: Payer: Self-pay

## 2020-12-11 ENCOUNTER — Other Ambulatory Visit: Payer: Self-pay | Admitting: *Deleted

## 2020-12-11 MED ORDER — CLONAZEPAM 0.5 MG PO TABS: 0.5000 mg | ORAL_TABLET | Freq: Every day | ORAL | 0 refills | Status: DC

## 2020-12-11 NOTE — Progress Notes (Signed)
Orders placed for covid test prior to PFT's

## 2020-12-11 NOTE — Telephone Encounter (Signed)
Name of Medication: clonazepam 0.5 mg Name of Pharmacy: [facility] Last Fill or Written Date and Quantity: # 15 on 11/12/2020 Last Office Visit and Type: 09/01/2020 Next Office Visit and Type: none scheduled  Pt has two pills left for tonight and request refill done 12/11/20.

## 2020-12-14 ENCOUNTER — Ambulatory Visit: Payer: Medicare Other | Admitting: Internal Medicine

## 2020-12-17 ENCOUNTER — Institutional Professional Consult (permissible substitution): Payer: Medicare Other | Admitting: Pulmonary Disease

## 2020-12-18 ENCOUNTER — Encounter: Payer: Self-pay | Admitting: Radiation Oncology

## 2020-12-18 ENCOUNTER — Ambulatory Visit
Admission: RE | Admit: 2020-12-18 | Discharge: 2020-12-18 | Disposition: A | Discharge status: Home / Self Care | Payer: Medicare Other | Source: Ambulatory Visit | Attending: Radiation Oncology | Admitting: Radiation Oncology

## 2020-12-18 ENCOUNTER — Encounter: Payer: Self-pay | Admitting: *Deleted

## 2020-12-18 VITALS — BP 149/60 | HR 77 | Temp 95.0°F | Resp 16 | Wt 95.2 lb

## 2020-12-18 DIAGNOSIS — Z79899 Other long term (current) drug therapy: Secondary | ICD-10-CM | POA: Insufficient documentation

## 2020-12-18 DIAGNOSIS — R221 Localized swelling, mass and lump, neck: Secondary | ICD-10-CM | POA: Diagnosis not present

## 2020-12-18 DIAGNOSIS — F17210 Nicotine dependence, cigarettes, uncomplicated: Secondary | ICD-10-CM | POA: Insufficient documentation

## 2020-12-18 DIAGNOSIS — Z809 Family history of malignant neoplasm, unspecified: Secondary | ICD-10-CM | POA: Diagnosis not present

## 2020-12-18 DIAGNOSIS — C3412 Malignant neoplasm of upper lobe, left bronchus or lung: Secondary | ICD-10-CM | POA: Diagnosis not present

## 2020-12-18 DIAGNOSIS — Z7984 Long term (current) use of oral hypoglycemic drugs: Secondary | ICD-10-CM | POA: Diagnosis not present

## 2020-12-18 NOTE — Progress Notes (Signed)
  Oncology Nurse Navigator Documentation  Navigator Location: [facility] (12/18/20 1500)   )Navigator Encounter Type: Initial RadOnc (12/18/20 1500)                         Barriers/Navigation Needs: Coordination of Care (12/18/20 1500)   Interventions: Coordination of Care (12/18/20 1500)   Coordination of Care: Appts (12/18/20 1500)         met with patient during initial consult with Dr. Baruch Gouty. All questions answered during visit. Reviewed upcoming appts with patient. Instructed pt to call with any further questions or needs. Pt verbalized understanding.         Time Spent with Patient: 60 (12/18/20 1500)

## 2020-12-18 NOTE — Consult Note (Signed)
NEW PATIENT EVALUATION  Name: Kathryn Ware  MRN: [id number]  Date:   12/18/2020     DOB: 11/02/1940   This 80 y.o. female patient presents to the clinic for initial evaluation of stage I non-small cell lung cancer left upper lobe.  REFERRING PHYSICIAN: Venia Carbon, MD  CHIEF COMPLAINT:  Chief Complaint  Patient presents with  . Lung Cancer    Initial consultation    DIAGNOSIS: The encounter diagnosis was Primary cancer of left upper lobe of lung ([place]).   PREVIOUS INVESTIGATIONS:  CT scan and PET CT scan reviewed Clinical notes reviewed Pathology report reviewed  HPI: Patient is a 80 year old female picked up on a screening CT scan to have a lesion of the left upper lobe.  This was a spiculated peripheral apical lesion concerning for malignancy measuring approximately 2.0 cm.  The lesion was hypermetabolic with no evidence of hypermetabolic activity in the mediastinal nodes or hilum.  She does have a lesion in her right parotid which may be a pleomorphic adenoma to be seen by ENT.  She underwent an ENB assisted biopsy showing non-small cell cancer favoring adenocarcinoma.  Patient has declined surgical intervention is now referred to radiation oncology for consideration of treatment.  She is asymptomatic specifically denies cough hemoptysis chest tightness or any pain or dysphagia.  PLANNED TREATMENT REGIMEN: SBRT  PAST MEDICAL HISTORY:  has a past medical history of Anemia, Aortic atherosclerosis ([place]), Blood transfusion without reported diagnosis, CAD (coronary artery disease), CHF (congestive heart failure) ([place]), CKD (chronic kidney disease), stage III ([place]), Diabetes mellitus type II, GERD (gastroesophageal reflux disease), GI (gastrointestinal bleed), GI AVM (gastrointestinal arteriovenous vascular malformation), History of echocardiogram (12/2013), History of nuclear stress test, HLD (hyperlipidemia), HTN (hypertension), Hypercholesterolemia, IDA (iron deficiency anemia),  Mild cognitive impairment, PAD (peripheral artery disease) ([place]), Polyp of colon, adenomatous, RLS (restless legs syndrome), and TIA (transient ischemic attack) (2021).    PAST SURGICAL HISTORY:  Past Surgical History:  Procedure Laterality Date  . ABDOMINAL ADHESION SURGERY  11/2001   Dr. Tamala Julian  . ABDOMINAL HYSTERECTOMY  1976   RSO (fibroid)  . COLONOSCOPY WITH PROPOFOL N/A 07/07/2016   Procedure: COLONOSCOPY WITH PROPOFOL;  Surgeon: Manya Silvas, MD;  Location[facility] ENDOSCOPY;  Service: Endoscopy;  Laterality: N/A;  . COLONOSCOPY WITH PROPOFOL N/A 12/14/2016   Procedure: COLONOSCOPY WITH PROPOFOL;  Surgeon: Manya Silvas, MD;  Location: [facility] ENDOSCOPY;  Service: Endoscopy;  Laterality: N/A;  . DOBUTAMINE STRESS ECHO  11/10   normal  . DOPPLER ECHOCARDIOGRAPHY     EF 55%, mild MR, TR  . ENTEROSCOPY N/A 06/08/2018   Procedure: ENTEROSCOPY;  Surgeon: Lin Landsman, MD;  Location: [facility] ENDOSCOPY;  Service: Gastroenterology;  Laterality: N/A;  . ENTEROSCOPY N/A 01/06/2020   Procedure: ENTEROSCOPY;  Surgeon: Lin Landsman, MD;  Location: [facility] ENDOSCOPY;  Service: Gastroenterology;  Laterality: N/A;  . ENTEROSCOPY N/A 08/22/2020   Procedure: ENTEROSCOPY;  Surgeon: [name], MD;  Location: [facility] ENDOSCOPY;  Service: Gastroenterology;  Laterality: N/A;  . ESOPHAGOGASTRODUODENOSCOPY  07/2006   multiple angioectasias  . ESOPHAGOGASTRODUODENOSCOPY Left 03/12/2015   Procedure: place tube in throat and evaluate stomach and duodenum for source of bleeding. Cauterize if concern for rebleeding.;  Surgeon: Hulen Luster, MD;  Location: [facility] ENDOSCOPY;  Service: Endoscopy;  Laterality: Left;  . ESOPHAGOGASTRODUODENOSCOPY N/A 12/14/2016   Procedure: ESOPHAGOGASTRODUODENOSCOPY (EGD);  Surgeon: Manya Silvas, MD;  Location: [facility] ENDOSCOPY;  Service: Endoscopy;  Laterality: N/A;  . ESOPHAGOGASTRODUODENOSCOPY N/A 07/19/2017  Procedure: ESOPHAGOGASTRODUODENOSCOPY (EGD);  Surgeon: Lin Landsman, MD;  Location: [facility] ENDOSCOPY;  Service: Gastroenterology;  Laterality: N/A;  . ESOPHAGOGASTRODUODENOSCOPY (EGD) WITH PROPOFOL N/A 11/23/2015   Procedure: ESOPHAGOGASTRODUODENOSCOPY (EGD) WITH PROPOFOL;  Surgeon: Hulen Luster, MD;  Location: [facility] ENDOSCOPY;  Service: Gastroenterology;  Laterality: N/A;  . ESOPHAGOGASTRODUODENOSCOPY (EGD) WITH PROPOFOL N/A 07/07/2016   Procedure: ESOPHAGOGASTRODUODENOSCOPY (EGD) WITH PROPOFOL;  Surgeon: Manya Silvas, MD;  Location: [facility] ENDOSCOPY;  Service: Endoscopy;  Laterality: N/A;  . ESOPHAGOGASTRODUODENOSCOPY (EGD) WITH PROPOFOL N/A 05/19/2017   Procedure: ESOPHAGOGASTRODUODENOSCOPY (EGD) WITH PROPOFOL;  Surgeon: Manya Silvas, MD;  Location: [facility] ENDOSCOPY;  Service: Endoscopy;  Laterality: N/A;  . ESOPHAGOGASTRODUODENOSCOPY (EGD) WITH PROPOFOL N/A 06/26/2020   Procedure: ESOPHAGOGASTRODUODENOSCOPY (EGD) WITH PROPOFOL;  Surgeon: Lucilla Lame, MD;  Location: [facility] ENDOSCOPY;  Service: Endoscopy;  Laterality: N/A;  . laryngeal polyp  10/2008   Dr. Tami Ribas  . TOP    . VIDEO BRONCHOSCOPY WITH ENDOBRONCHIAL NAVIGATION Left 12/02/2020   Procedure: VIDEO BRONCHOSCOPY WITH ENDOBRONCHIAL NAVIGATION;  Surgeon: Tyler Pita, MD;  Location: [facility] ORS;  Service: Pulmonary;  Laterality: Left;    FAMILY HISTORY: family history includes Cancer in her sister; Diabetes in an other family member; Hypertension in her mother and another family member; Stroke in her father.  SOCIAL HISTORY:  reports that she has been smoking cigarettes. She has a 63.00 pack-year smoking history. She has never used smokeless tobacco. She reports that she does not drink alcohol and does not use drugs.  ALLERGIES: Nifedipine and Metformin  MEDICATIONS:  Current Outpatient Medications  Medication Sig Dispense Refill  . albuterol (VENTOLIN HFA) 108 (90 Base) MCG/ACT inhaler INHALE 2 PUFFS INTO THE LUNGS EVERY 6 HOURS AS NEEDED FOR WHEEZING OR SHORTNESS OF BREATH (Patient taking  differently: Inhale 2 puffs into the lungs every 6 (six) hours as needed. INHALE 2 PUFFS INTO THE LUNGS EVERY 6 HOURS AS NEEDED FOR WHEEZING OR SHORTNESS OF BREATH) 8.5 g 0  . clonazePAM (KLONOPIN) 0.5 MG tablet Take 1-2 tablets (0.5-1 mg total) by mouth at bedtime. 60 tablet 0  . ferrous sulfate 325 (65 FE) MG tablet Take 325 mg by mouth daily with breakfast.    . glucose blood (ONETOUCH VERIO) test strip Use to check blood sugar once daily E11.69 100 each 12  . lisinopril-hydrochlorothiazide (ZESTORETIC) 20-12.5 MG tablet Take 1 tablet by mouth at bedtime.    . metFORMIN (GLUCOPHAGE-XR) 500 MG 24 hr tablet Take 500 mg by mouth daily with breakfast.    . omeprazole (PRILOSEC) 40 MG capsule Take 1 capsule (40 mg total) by mouth in the morning and at bedtime. 180 capsule 3  . OneTouch Delica Lancets 10X MISC 1 each by Does not apply route daily. Use to obtain blood sugar sample daily E11.69 100 each 12  . simvastatin (ZOCOR) 80 MG tablet TAKE 1 TABLET(80 MG) BY MOUTH DAILY (Patient taking differently: Take 80 mg by mouth every morning. TAKE 1 TABLET(80 MG) BY MOUTH DAILY) 90 tablet 0  . vitamin B-12 (CYANOCOBALAMIN) 1000 MCG tablet Take 1,000 mcg by mouth daily.     No current facility-administered medications for this encounter.    ECOG PERFORMANCE STATUS:  0 - Asymptomatic  REVIEW OF SYSTEMS: Patient denies any weight loss, fatigue, weakness, fever, chills or night sweats. Patient denies any loss of vision, blurred vision. Patient denies any ringing  of the ears or hearing loss. No irregular heartbeat. Patient denies heart murmur or history of fainting. Patient denies any chest pain  or pain radiating to her upper extremities. Patient denies any shortness of breath, difficulty breathing at night, cough or hemoptysis. Patient denies any swelling in the lower legs. Patient denies any nausea vomiting, vomiting of blood, or coffee ground material in the vomitus. Patient denies any stomach pain. Patient  states has had normal bowel movements no significant constipation or diarrhea. Patient denies any dysuria, hematuria or significant nocturia. Patient denies any problems walking, swelling in the joints or loss of balance. Patient denies any skin changes, loss of hair or loss of weight. Patient denies any excessive worrying or anxiety or significant depression. Patient denies any problems with insomnia. Patient denies excessive thirst, polyuria, polydipsia. Patient denies any swollen glands, patient denies easy bruising or easy bleeding. Patient denies any recent infections, allergies or URI. Patient "s visual fields have not changed significantly in recent time.   PHYSICAL EXAM: BP (!) 149/60 (BP Location: Left Arm, Patient Position: Sitting)   Pulse 77   Temp (!) 95 F (35 C) (Tympanic)   Resp 16   Wt 95 lb 3.2 oz (43.2 kg)   LMP  (LMP Unknown)   BMI 15.37 kg/m  Well-developed well-nourished patient in NAD. HEENT reveals PERLA, EOMI, discs not visualized.  Oral cavity is clear. No oral mucosal lesions are identified. Neck is clear without evidence of cervical or supraclavicular adenopathy. Lungs are clear to A&P. Cardiac examination is essentially unremarkable with regular rate and rhythm without murmur rub or thrill. Abdomen is benign with no organomegaly or masses noted. Motor sensory and DTR levels are equal and symmetric in the upper and lower extremities. Cranial nerves II through XII are grossly intact. Proprioception is intact. No peripheral adenopathy or edema is identified. No motor or sensory levels are noted. Crude visual fields are within normal range.  LABORATORY DATA: Pathology report reviewed    RADIOLOGY RESULTS: CT scan and PET CT scan reviewed compatible with above-stated findings   IMPRESSION: Stage I non-small cell lung cancer favoring adenocarcinoma the left upper lobe in 80 year old female  PLAN: At this time I have recommended SBRT.  Would plan on delivering 60 Gray in 5  fractions.  Would use motion restriction and 4-dimensional treatment planning at the time of simulation.  Risks and benefits of treatment including possible fatigue possible development of slight cough and loss of normal architecture of her lung all were discussed in detail with the patient.  She seems to comprehend my treatment plan well.  I have personally set up and ordered CT simulation for next week.  I would like to take this opportunity to thank you for allowing me to participate in the care of your patient.[name], MD

## 2020-12-21 ENCOUNTER — Other Ambulatory Visit: Payer: Self-pay

## 2020-12-21 ENCOUNTER — Other Ambulatory Visit
Admission: RE | Admit: 2020-12-21 | Discharge: 2020-12-21 | Disposition: A | Discharge status: Home / Self Care | Payer: Medicare Other | Source: Ambulatory Visit | Attending: Internal Medicine | Admitting: Internal Medicine

## 2020-12-21 DIAGNOSIS — Z20822 Contact with and (suspected) exposure to covid-19: Secondary | ICD-10-CM | POA: Diagnosis not present

## 2020-12-21 DIAGNOSIS — Z01812 Encounter for preprocedural laboratory examination: Secondary | ICD-10-CM | POA: Insufficient documentation

## 2020-12-22 ENCOUNTER — Encounter: Payer: Self-pay | Admitting: *Deleted

## 2020-12-22 ENCOUNTER — Ambulatory Visit: Payer: Medicare Other | Attending: Internal Medicine

## 2020-12-22 ENCOUNTER — Ambulatory Visit
Admission: RE | Admit: 2020-12-22 | Discharge: 2020-12-22 | Disposition: A | Discharge status: Home / Self Care | Payer: Medicare Other | Source: Ambulatory Visit | Attending: Radiation Oncology | Admitting: Radiation Oncology

## 2020-12-22 DIAGNOSIS — J449 Chronic obstructive pulmonary disease, unspecified: Secondary | ICD-10-CM | POA: Diagnosis not present

## 2020-12-22 DIAGNOSIS — F17210 Nicotine dependence, cigarettes, uncomplicated: Secondary | ICD-10-CM | POA: Diagnosis not present

## 2020-12-22 DIAGNOSIS — C3412 Malignant neoplasm of upper lobe, left bronchus or lung: Secondary | ICD-10-CM | POA: Insufficient documentation

## 2020-12-22 DIAGNOSIS — C3490 Malignant neoplasm of unspecified part of unspecified bronchus or lung: Secondary | ICD-10-CM | POA: Diagnosis not present

## 2020-12-22 DIAGNOSIS — Z510 Encounter for antineoplastic radiation therapy: Secondary | ICD-10-CM | POA: Insufficient documentation

## 2020-12-22 LAB — PULMONARY FUNCTION TEST ARMC ONLY
DL/VA % pred: 68 %
DL/VA: 2.77 ml/min/mmHg/L
DLCO unc % pred: 59 %
DLCO unc: 12.02 ml/min/mmHg
FEF 25-75 Post: 0.85 L/sec
FEF 25-75 Pre: 0.77 L/sec
FEF2575-%Change-Post: 10 %
FEF2575-%Pred-Post: 55 %
FEF2575-%Pred-Pre: 50 %
FEV1-%Change-Post: 1 %
FEV1-%Pred-Post: 78 %
FEV1-%Pred-Pre: 76 %
FEV1-Post: 1.41 L
FEV1-Pre: 1.38 L
FEV1FVC-%Change-Post: -3 %
FEV1FVC-%Pred-Pre: 92 %
FEV6-%Change-Post: 3 %
FEV6-%Pred-Post: 91 %
FEV6-%Pred-Pre: 88 %
FEV6-Post: 2.04 L
FEV6-Pre: 1.98 L
FEV6FVC-%Change-Post: -1 %
FEV6FVC-%Pred-Post: 102 %
FEV6FVC-%Pred-Pre: 104 %
FVC-%Change-Post: 5 %
FVC-%Pred-Post: 89 %
FVC-%Pred-Pre: 85 %
FVC-Post: 2.08 L
Post FEV1/FVC ratio: 68 %
Post FEV6/FVC ratio: 98 %
Pre FEV1/FVC ratio: 70 %
Pre FEV6/FVC Ratio: 100 %
RV % pred: 82 %
RV: 2.03 L
TLC % pred: 78 %
TLC: 4.18 L

## 2020-12-22 LAB — SARS CORONAVIRUS 2 (TAT 6-24 HRS): SARS Coronavirus 2: NEGATIVE

## 2020-12-22 MED ORDER — ALBUTEROL SULFATE (2.5 MG/3ML) 0.083% IN NEBU
2.5000 mg | INHALATION_SOLUTION | Freq: Once | RESPIRATORY_TRACT | Status: AC
  Administered 2020-12-22: 2.5 mg via RESPIRATORY_TRACT
  Filled 2020-12-22: qty 3

## 2020-12-22 NOTE — Progress Notes (Signed)
  Oncology Nurse Navigator Documentation  Navigator Location: [facility] Onc (12/22/20 0900)   )Navigator Encounter Type: Appt/Treatment Plan Review (12/22/20 0900)                     Patient Visit Type: RadOnc (12/22/20 0900) Treatment Phase: CT SIM (12/22/20 0900) Barriers/Navigation Needs: No Barriers At This Time;No Needs (12/22/20 0900)   Interventions: None Required (12/22/20 0900)                      Time Spent with Patient: 30 (12/22/20 0900)

## 2020-12-23 ENCOUNTER — Other Ambulatory Visit: Payer: Self-pay | Admitting: Internal Medicine

## 2020-12-23 ENCOUNTER — Other Ambulatory Visit: Payer: Self-pay

## 2020-12-23 DIAGNOSIS — C3490 Malignant neoplasm of unspecified part of unspecified bronchus or lung: Secondary | ICD-10-CM

## 2020-12-23 NOTE — Research (Signed)
Trial:  [facility] Protocol Patient Kathryn Ware was identified by Jeral Fruit, RN as a potential candidate for the above listed study.  This Clinical Research Coordinator met with Kathryn Ware, [id number], on 12/23/20 via telephone to discuss participation in the above listed research study.  Patient is Unaccompanied. The informed consent document and separate HIPAA Authorization was reviewed with the patient.  Patient reads, speaks, and understands [place].    Approximately 15 minutes were spent with the patient reviewing the informed consent documents. The patient verifies that she in interested in participating in the protocol. She is in agreement to come in prior to her radiation treatment on Tuesday, December 29, 2020 at 1030 am for consent / hipaa visit and blood draw for the protocol. The patient understands that her participation is voluntary and she can withdraw her consent to participate at any time. The patient states that she understands the procedures for Tuesday, requests research call a day prior to her appointment just to remind her as she didn't have anything to write the time down with today. Research nurse offered to complete her e-chart access, patient refused due to not having computer access.   Jeral Fruit, RN 12/23/20 10:32 AM

## 2020-12-24 ENCOUNTER — Encounter: Payer: Self-pay | Admitting: Pulmonary Disease

## 2020-12-24 ENCOUNTER — Ambulatory Visit (INDEPENDENT_AMBULATORY_CARE_PROVIDER_SITE_OTHER): Payer: Medicare Other | Admitting: Pulmonary Disease

## 2020-12-24 ENCOUNTER — Other Ambulatory Visit: Payer: Self-pay

## 2020-12-24 VITALS — BP 140/58 | HR 84 | Temp 97.3°F | Ht 66.0 in | Wt 94.0 lb

## 2020-12-24 DIAGNOSIS — J432 Centrilobular emphysema: Secondary | ICD-10-CM | POA: Diagnosis not present

## 2020-12-24 DIAGNOSIS — C3492 Malignant neoplasm of unspecified part of left bronchus or lung: Secondary | ICD-10-CM | POA: Diagnosis not present

## 2020-12-24 DIAGNOSIS — F17210 Nicotine dependence, cigarettes, uncomplicated: Secondary | ICD-10-CM

## 2020-12-24 DIAGNOSIS — Z510 Encounter for antineoplastic radiation therapy: Secondary | ICD-10-CM | POA: Diagnosis not present

## 2020-12-24 DIAGNOSIS — C3412 Malignant neoplasm of upper lobe, left bronchus or lung: Secondary | ICD-10-CM | POA: Diagnosis not present

## 2020-12-24 NOTE — Progress Notes (Signed)
Subjective:    Patient ID: Kathryn Ware, female    DOB: [birth year], 80 y.o.   MRN: [id number]  HPI Kathryn Ware is a 80 year old current smoker (1 PPD) who follows here after ENB for diagnosis of a left upper lobe adenocarcinoma.  The procedure on 02 December 2020.  Has not had any issues.  She has severe centrilobular emphysema noted on CT scan of the chest however she is totally asymptomatic.  She only uses albuterol rarely.  She is not motivated to quit smoking.  We discussed the importance of quitting smoking as this will help with any therapies for her lung cancer.  She is to start SBRT by Dr. Baruch Gouty on 29 December 2020.  She has absolutely no complaints today.  She feels well and looks well.   Review of Systems A 10 point review of systems was performed and it is as noted above otherwise negative.  Patient Active Problem List   Diagnosis Date Noted  . Primary cancer of left upper lobe of lung ([place]) 12/10/2020  . Tachycardia   . GI bleed 08/22/2020  . Chronic blood loss anemia 08/22/2020  . Angiodysplasia of stomach and duodenum with hemorrhage   . Gastritis with hemorrhage   . Weakness   . GI bleeding 06/24/2020  . Symptomatic anemia 01/05/2020  . HLD (hyperlipidemia) 01/05/2020  . GERD (gastroesophageal reflux disease) 01/05/2020  . Anxiety 01/05/2020  . Chronic renal disease, stage III ([place]) 01/05/2020  . Tobacco abuse 01/05/2020  . Syncope 01/05/2020  . GIB (gastrointestinal bleeding) 01/05/2020  . Peripheral arterial disease ([place]) 09/12/2019  . Chronic bronchitis ([place]) 12/20/2018  . Aortic atherosclerosis ([place]) 12/19/2017  . Chronic diastolic CHF (congestive heart failure) ([place]) 05/31/2017  . Malnutrition of mild degree ([place]) 03/30/2017  . History of adenomatous polyp of colon 09/19/2016  . Anemia due to chronic blood loss 07/05/2016  . Restless legs syndrome (RLS) 02/23/2016  . 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 AKI (acute kidney injury) ([place]) 03/19/2015   . Congenital gastrointestinal vessel anomaly 03/19/2015  . Acute on chronic blood loss anemia 02/18/2015  . Advance directive discussed with patient 02/18/2015  . Nicotine dependence 10/29/2013  . Routine general medical examination at a health care facility 08/17/2011  . Mild cognitive impairment 08/17/2011  . Type 2 diabetes mellitus with hyperlipidemia ([place]) 02/02/2007  . HYPERCHOLESTEROLEMIA 02/02/2007  . Essential hypertension 02/02/2007  . Angiodysplasia of small intestine ([place]) 02/02/2007  . Benign essential hypertension 02/02/2007   Social History   Tobacco Use  . Smoking status: Current Every Day Smoker    Packs/day: 1.50    Years: 63.00    Pack years: 94.50    Types: Cigarettes  . Smokeless tobacco: Never Used  . Tobacco comment: 1 PPD 12/24/2020  Substance Use Topics  . Alcohol use: No   Allergies  Allergen Reactions  . Nifedipine Cough  . Metformin Diarrhea    REACTION: diarrhea when dosage is increased, ok when taking one tablet daily, patient aware that she is on metformin when it is listed that she is allergic   Current Meds  Medication Sig  . albuterol (VENTOLIN HFA) 108 (90 Base) MCG/ACT inhaler INHALE 2 PUFFS INTO THE LUNGS EVERY 6 HOURS AS NEEDED FOR WHEEZING OR SHORTNESS OF BREATH (Patient taking differently: Inhale 2 puffs into the lungs every 6 (six) hours as needed. INHALE 2 PUFFS INTO THE LUNGS EVERY 6 HOURS AS NEEDED FOR WHEEZING OR SHORTNESS OF BREATH)  . clonazePAM (KLONOPIN) 0.5 MG tablet Take  1-2 tablets (0.5-1 mg total) by mouth at bedtime.  . ferrous sulfate 325 (65 FE) MG tablet Take 325 mg by mouth daily with breakfast.  . glucose blood (ONETOUCH VERIO) test strip Use to check blood sugar once daily E11.69  . lisinopril-hydrochlorothiazide (ZESTORETIC) 20-12.5 MG tablet TAKE 2 TABLETS BY MOUTH DAILY  . metFORMIN (GLUCOPHAGE-XR) 500 MG 24 hr tablet Take 500 mg by mouth daily with breakfast.  . omeprazole (PRILOSEC) 40 MG capsule Take 1 capsule  (40 mg total) by mouth in the morning and at bedtime.  Glory [place] Delica Lancets 01B MISC 1 each by Does not apply route daily. Use to obtain blood sugar sample daily E11.69  . simvastatin (ZOCOR) 80 MG tablet TAKE 1 TABLET(80 MG) BY MOUTH DAILY (Patient taking differently: Take 80 mg by mouth every morning. TAKE 1 TABLET(80 MG) BY MOUTH DAILY)  . vitamin B-12 (CYANOCOBALAMIN) 1000 MCG tablet Take 1,000 mcg by mouth daily.   Immunization History  Administered Date(s) Administered  . Influenza Inj Mdck Quad Pf 08/02/2019  . Influenza Split 08/17/2011, 08/02/2013, 07/24/2014  . Influenza Whole 08/13/2009, 08/11/2010  . Influenza, High Dose Seasonal PF 08/10/2015  . Influenza,inj,Quad PF,6+ Mos 07/20/2016  . Influenza-Unspecified 07/09/2014, 08/11/2017, 07/24/2018, 07/25/2020  . PFIZER(Purple Top)SARS-COV-2 Vaccination 12/13/2019, 01/03/2020, 07/23/2020  . Pneumococcal Conjugate-13 02/18/2015  . Pneumococcal Polysaccharide-23 01/24/2008, 05/31/2017  . Td 05/17/2006       Objective:   Physical Exam BP (!) 140/58 (BP Location: Left Arm, Cuff Size: Normal)   Pulse 84   Temp (!) 97.3 F (36.3 C) (Temporal)   Ht 5\' 6"  (1.676 m)   Wt 94 lb (42.6 kg)   LMP  (LMP Unknown)   SpO2 100%   BMI 15.17 kg/m   GENERAL: Thin, well-developed woman, no acute distress.  No conversational dyspnea. Fully ambulatory. HEAD: Normocephalic, atraumatic.  EYES: Pupils equal, round, reactive to light.  No scleral icterus.  MOUTH: Nose/mouth/throat not examined due to masking requirements for COVID 19.   NECK: Supple. No thyromegaly. Trachea midline. No JVD.  No adenopathy. PULMONARY: Good air entry bilaterally.  No adventitious sounds. CARDIOVASCULAR: S1 and S2. Regular rate and rhythm.  Grade 1/6 systolic ejection murmur left sternal border distant with mitral regurg. ABDOMEN: Scaphoid, otherwise benign. MUSCULOSKELETAL: No joint deformity, no clubbing, no edema.  NEUROLOGIC: No overt focal deficit speech  is fluent.  No gait disturbance. SKIN: Intact,warm,dry.  On limited exam no rashes. PSYCH: Behavior normal.      Assessment & Plan:     ICD-10-CM   1. Adenocarcinoma of left lung [facility])  C34.92    Patient to start SBRT Continue follow-up by oncology.  2. Centrilobular emphysema ([facility])  J43.2    On albuterol as needed rarely uses Patient asymptomatic otherwise Recommended discontinuation of smoking  3. Tobacco dependence due to cigarettes  F17.210    Patient counseled regards discontinuation of smoking Total counseling time 3 to 5 minutes     Patient is to start SBRT for adenocarcinoma of the left upper lobe.  Follow-up will be per oncology. We will see her in follow-up in 6 months time or sooner as needed.  Renold Don, MD [facility]   *This note was dictated using voice recognition software/Dragon.  Despite best efforts to proofread, errors can occur which can change the meaning.  Any change was purely unintentional.

## 2020-12-24 NOTE — Patient Instructions (Signed)
From our standpoint you are doing well.  I do recommend that you quit smoking.   See you in follow-up in 6 months time call sooner should you develop any issues prior to that.

## 2020-12-28 ENCOUNTER — Telehealth: Payer: Self-pay

## 2020-12-28 DIAGNOSIS — C3412 Malignant neoplasm of upper lobe, left bronchus or lung: Secondary | ICD-10-CM

## 2020-12-28 NOTE — Telephone Encounter (Signed)
Research nurse spoke with the patient this morning to remind her of her appointment with [place] at 1030 am prior to her radiation appt at 1130 am. The patient is aware that we will review the ICF and Hipaa in detail prior to her labs being taken for the study. The patient was reminded that her participation is totally voluntary and she does not have to be in the study if she doesn't want to.  Jeral Fruit, RN 12/28/20 11:54 AM

## 2020-12-29 ENCOUNTER — Other Ambulatory Visit: Payer: Self-pay

## 2020-12-29 ENCOUNTER — Encounter: Payer: Self-pay | Admitting: *Deleted

## 2020-12-29 ENCOUNTER — Ambulatory Visit
Admission: RE | Admit: 2020-12-29 | Discharge: 2020-12-29 | Disposition: A | Discharge status: Home / Self Care | Payer: Medicare Other | Source: Ambulatory Visit | Attending: Radiation Oncology | Admitting: Radiation Oncology

## 2020-12-29 ENCOUNTER — Inpatient Hospital Stay: Payer: Medicare Other | Attending: Internal Medicine

## 2020-12-29 ENCOUNTER — Inpatient Hospital Stay: Payer: Medicare Other

## 2020-12-29 DIAGNOSIS — C3412 Malignant neoplasm of upper lobe, left bronchus or lung: Secondary | ICD-10-CM | POA: Diagnosis not present

## 2020-12-29 DIAGNOSIS — Z510 Encounter for antineoplastic radiation therapy: Secondary | ICD-10-CM | POA: Diagnosis not present

## 2020-12-29 DIAGNOSIS — C3490 Malignant neoplasm of unspecified part of unspecified bronchus or lung: Secondary | ICD-10-CM

## 2020-12-29 NOTE — Research (Signed)
Patient in to the cancer center this am for her scheduled research consult visit for the [facility] Pathology protocol. Patient informed the registration staff. Silverio Lay,  that she only wanted to go to her radiation appointment, and not to research. Patients research labs were cancelled for the protocol as she refused participation. Jeral Fruit, RN 12/29/20 11:48 AM

## 2020-12-31 ENCOUNTER — Ambulatory Visit
Admission: RE | Admit: 2020-12-31 | Discharge: 2020-12-31 | Disposition: A | Discharge status: Home / Self Care | Payer: Medicare Other | Source: Ambulatory Visit | Attending: Radiation Oncology | Admitting: Radiation Oncology

## 2020-12-31 DIAGNOSIS — Z510 Encounter for antineoplastic radiation therapy: Secondary | ICD-10-CM | POA: Diagnosis not present

## 2020-12-31 DIAGNOSIS — C3412 Malignant neoplasm of upper lobe, left bronchus or lung: Secondary | ICD-10-CM | POA: Diagnosis not present

## 2021-01-01 NOTE — Progress Notes (Signed)
  Oncology Nurse Navigator Documentation  Navigator Location: [facility] Onc (12/29/20 1130)   )Navigator Encounter Type: Appt/Treatment Plan Review (12/29/20 1130)                   Treatment Initiated Date: 12/29/20 (12/29/20 1130) Patient Visit Type: HUTMLY (12/29/20 1130) Treatment Phase: First Radiation Tx (12/29/20 1130) Barriers/Navigation Needs: No Barriers At This Time (12/29/20 1130)   Interventions: None Required (12/29/20 1130)                      Time Spent with Patient: 15 (12/29/20 1130)

## 2021-01-05 ENCOUNTER — Ambulatory Visit
Admission: RE | Admit: 2021-01-05 | Discharge: 2021-01-05 | Disposition: A | Discharge status: Home / Self Care | Payer: Medicare Other | Source: Ambulatory Visit | Attending: Radiation Oncology | Admitting: Radiation Oncology

## 2021-01-05 DIAGNOSIS — C3412 Malignant neoplasm of upper lobe, left bronchus or lung: Secondary | ICD-10-CM | POA: Diagnosis not present

## 2021-01-05 DIAGNOSIS — Z510 Encounter for antineoplastic radiation therapy: Secondary | ICD-10-CM | POA: Diagnosis not present

## 2021-01-07 ENCOUNTER — Ambulatory Visit
Admission: RE | Admit: 2021-01-07 | Discharge: 2021-01-07 | Disposition: A | Discharge status: Home / Self Care | Payer: Medicare Other | Source: Ambulatory Visit | Attending: Radiation Oncology | Admitting: Radiation Oncology

## 2021-01-07 DIAGNOSIS — Z510 Encounter for antineoplastic radiation therapy: Secondary | ICD-10-CM | POA: Diagnosis not present

## 2021-01-07 DIAGNOSIS — C3412 Malignant neoplasm of upper lobe, left bronchus or lung: Secondary | ICD-10-CM | POA: Diagnosis not present

## 2021-01-11 ENCOUNTER — Other Ambulatory Visit: Payer: Self-pay | Admitting: Internal Medicine

## 2021-01-11 MED ORDER — CLONAZEPAM 0.5 MG PO TABS: 0.5000 mg | ORAL_TABLET | Freq: Every day | ORAL | 0 refills | Status: DC

## 2021-01-11 NOTE — Telephone Encounter (Signed)
Patient left a voicemail stating that she is out of her Clonazepam.  Last refill 12/11/20 #60 Last office visit 09/01/20 No upcoming appointment available

## 2021-01-12 ENCOUNTER — Ambulatory Visit
Admission: RE | Admit: 2021-01-12 | Discharge: 2021-01-12 | Disposition: A | Discharge status: Home / Self Care | Payer: Medicare Other | Source: Ambulatory Visit | Attending: Radiation Oncology | Admitting: Radiation Oncology

## 2021-01-12 DIAGNOSIS — Z510 Encounter for antineoplastic radiation therapy: Secondary | ICD-10-CM | POA: Diagnosis not present

## 2021-01-12 DIAGNOSIS — C3412 Malignant neoplasm of upper lobe, left bronchus or lung: Secondary | ICD-10-CM | POA: Diagnosis not present

## 2021-01-12 DIAGNOSIS — F17210 Nicotine dependence, cigarettes, uncomplicated: Secondary | ICD-10-CM | POA: Diagnosis not present

## 2021-01-22 ENCOUNTER — Telehealth: Payer: Self-pay

## 2021-01-22 NOTE — Telephone Encounter (Signed)
Pt said Dr Silvio Pate told pt to cut back and only take one tablet daily; pt does not know when Dr Silvio Pate told pt this but is taking one tab daily and pharmacy is sending instructions lisinopril HCTZ 20-12.5 mg taking 2 tabs daily. Pt said she used to take lisinopril HCTZ 20-12.5 mg one tab bid but has been taking one daily for a while.  Pt will wait for cb after Dr Silvio Pate reviews. Pt said this weekend she will try to find her medical journal that has her meds listed. Sending note to DR Silvio Pate.(med list has lisinopril HCTZ 20-12.5 tab taking 2 tabs po daily.

## 2021-01-23 NOTE — Telephone Encounter (Signed)
Her blood pressure has been okay---but not low. I would have her continue whatever she has been taking ---at least over the past couple of months If that is just one daily---have her continue that and adjust medication list

## 2021-01-25 NOTE — Telephone Encounter (Signed)
Spoke to pt. She is only taking 1 daily. Will continue with 1 daily and monitor BP. If it starts to go up, will let us know.

## 2021-02-09 ENCOUNTER — Other Ambulatory Visit: Payer: Self-pay

## 2021-02-09 MED ORDER — CLONAZEPAM 0.5 MG PO TABS: 0.5000 mg | ORAL_TABLET | Freq: Every day | ORAL | 0 refills | Status: DC

## 2021-02-09 NOTE — Telephone Encounter (Signed)
Pharmacy requests refill on: Clonazepam 0.5 mg   LAST REFILL: 01/11/2021 (Q-60, R-0) LAST OV: 09/01/2020 NEXT OV: Not Scheduled  PHARMACY: [id number] [place], [place]

## 2021-02-15 ENCOUNTER — Ambulatory Visit
Admission: RE | Admit: 2021-02-15 | Discharge: 2021-02-15 | Disposition: A | Discharge status: Home / Self Care | Payer: Medicare Other | Source: Ambulatory Visit | Attending: Radiation Oncology | Admitting: Radiation Oncology

## 2021-02-15 ENCOUNTER — Other Ambulatory Visit: Payer: Self-pay | Admitting: *Deleted

## 2021-02-15 VITALS — BP 156/67 | HR 82 | Temp 96.4°F | Wt 94.5 lb

## 2021-02-15 DIAGNOSIS — Z923 Personal history of irradiation: Secondary | ICD-10-CM | POA: Diagnosis not present

## 2021-02-15 DIAGNOSIS — C3412 Malignant neoplasm of upper lobe, left bronchus or lung: Secondary | ICD-10-CM | POA: Diagnosis not present

## 2021-02-15 NOTE — Progress Notes (Signed)
Radiation Oncology Follow up Note  Name: Kathryn Ware   Date:   02/15/2021 MRN:  [id number] DOB: 12-21-1940    This 80 y.o. female presents to the clinic today for 1 month follow-up status post SBRT to left upper lobe for adenocarcinoma.  REFERRING PROVIDER: Venia Carbon, MD  HPI: Patient is a 80 year old female now at 1 month having completed SBRT to her left upper lobe for adenocarcinoma.  Seen today in routine follow-up she is doing well.  She specifically denies cough hemoptysis chest tightness or any change in her pulmonary function..  COMPLICATIONS OF TREATMENT: none  FOLLOW UP COMPLIANCE: keeps appointments   PHYSICAL EXAM:  BP (!) 156/67   Pulse 82   Temp (!) 96.4 F (35.8 C) (Tympanic)   Wt 94 lb 8 oz (42.9 kg)   LMP  (LMP Unknown)   BMI 15.25 kg/m  Well-developed well-nourished patient in NAD. HEENT reveals PERLA, EOMI, discs not visualized.  Oral cavity is clear. No oral mucosal lesions are identified. Neck is clear without evidence of cervical or supraclavicular adenopathy. Lungs are clear to A&P. Cardiac examination is essentially unremarkable with regular rate and rhythm without murmur rub or thrill. Abdomen is benign with no organomegaly or masses noted. Motor sensory and DTR levels are equal and symmetric in the upper and lower extremities. Cranial nerves II through XII are grossly intact. Proprioception is intact. No peripheral adenopathy or edema is identified. No motor or sensory levels are noted. Crude visual fields are within normal range.  RADIOLOGY RESULTS: CT scan ordered for 3 months  PLAN: Present time she is at a very low side effect profile from her SBRT.  I am pleased with her overall progress I have asked to see her back in 3 months with a CT scan with contrast of the chest prior to that visit.  Patient knows to call with any concerns.  I would like to take this opportunity to thank you for allowing me to participate in the care of your patient.[name], MD

## 2021-02-16 ENCOUNTER — Other Ambulatory Visit: Payer: Self-pay | Admitting: Internal Medicine

## 2021-02-16 NOTE — Telephone Encounter (Signed)
Sent in refill. Pt due for OV in May.

## 2021-03-04 ENCOUNTER — Telehealth: Payer: Self-pay

## 2021-03-04 MED ORDER — ALBUTEROL SULFATE HFA 108 (90 BASE) MCG/ACT IN AERS: 2.0000 | INHALATION_SPRAY | Freq: Four times a day (QID) | RESPIRATORY_TRACT | 0 refills | Status: DC | PRN

## 2021-03-04 NOTE — Telephone Encounter (Signed)
Pt called triage for a refill of albuterol inhaler to [facility]. Rx sent electronically.

## 2021-03-10 ENCOUNTER — Other Ambulatory Visit: Payer: Self-pay

## 2021-03-10 MED ORDER — CLONAZEPAM 0.5 MG PO TABS: 0.5000 mg | ORAL_TABLET | Freq: Every day | ORAL | 0 refills | Status: DC

## 2021-03-10 NOTE — Telephone Encounter (Signed)
Name of Medication: Clonazepam 0.5 mg Name of Pharmacy: [facility] Last Fill or Written Date and Quantity: # 60 on 02/09/21 Last Office Visit and Type: 09/01/2020 HFU Next Office Visit and Type: none scheduled

## 2021-03-11 ENCOUNTER — Inpatient Hospital Stay (HOSPITAL_BASED_OUTPATIENT_CLINIC_OR_DEPARTMENT_OTHER): Payer: Medicare Other | Admitting: Nurse Practitioner

## 2021-03-11 ENCOUNTER — Inpatient Hospital Stay: Payer: Medicare Other | Attending: Nurse Practitioner

## 2021-03-11 ENCOUNTER — Encounter: Payer: Self-pay | Admitting: Nurse Practitioner

## 2021-03-11 ENCOUNTER — Inpatient Hospital Stay: Payer: Medicare Other

## 2021-03-11 VITALS — BP 146/50 | HR 77 | Temp 97.6°F | Resp 18 | Wt 93.2 lb

## 2021-03-11 DIAGNOSIS — K118 Other diseases of salivary glands: Secondary | ICD-10-CM | POA: Diagnosis not present

## 2021-03-11 DIAGNOSIS — Z79899 Other long term (current) drug therapy: Secondary | ICD-10-CM | POA: Insufficient documentation

## 2021-03-11 DIAGNOSIS — C3490 Malignant neoplasm of unspecified part of unspecified bronchus or lung: Secondary | ICD-10-CM

## 2021-03-11 DIAGNOSIS — D649 Anemia, unspecified: Secondary | ICD-10-CM

## 2021-03-11 DIAGNOSIS — F17210 Nicotine dependence, cigarettes, uncomplicated: Secondary | ICD-10-CM | POA: Diagnosis not present

## 2021-03-11 DIAGNOSIS — Z7984 Long term (current) use of oral hypoglycemic drugs: Secondary | ICD-10-CM | POA: Insufficient documentation

## 2021-03-11 DIAGNOSIS — C3412 Malignant neoplasm of upper lobe, left bronchus or lung: Secondary | ICD-10-CM | POA: Diagnosis not present

## 2021-03-11 DIAGNOSIS — D509 Iron deficiency anemia, unspecified: Secondary | ICD-10-CM | POA: Diagnosis not present

## 2021-03-11 LAB — CBC WITH DIFFERENTIAL/PLATELET
Abs Immature Granulocytes: 0.02 10*3/uL (ref 0.00–0.07)
Basophils Absolute: 0 10*3/uL (ref 0.0–0.1)
Basophils Relative: 1 %
Eosinophils Absolute: 0 10*3/uL (ref 0.0–0.5)
Eosinophils Relative: 1 %
HCT: 34.5 % — ABNORMAL LOW (ref 36.0–46.0)
Hemoglobin: 11.1 g/dL — ABNORMAL LOW (ref 12.0–15.0)
Immature Granulocytes: 1 %
Lymphocytes Relative: 18 %
Lymphs Abs: 0.7 10*3/uL (ref 0.7–4.0)
MCH: 27.2 pg (ref 26.0–34.0)
MCHC: 32.2 g/dL (ref 30.0–36.0)
MCV: 84.6 fL (ref 80.0–100.0)
Monocytes Absolute: 0.3 10*3/uL (ref 0.1–1.0)
Monocytes Relative: 9 %
Neutro Abs: 2.6 10*3/uL (ref 1.7–7.7)
Neutrophils Relative %: 70 %
Platelets: 311 10*3/uL (ref 150–400)
RBC: 4.08 MIL/uL (ref 3.87–5.11)
RDW: 13.1 % (ref 11.5–15.5)
WBC: 3.7 10*3/uL — ABNORMAL LOW (ref 4.0–10.5)
nRBC: 0 % (ref 0.0–0.2)

## 2021-03-11 LAB — COMPREHENSIVE METABOLIC PANEL
ALT: 9 U/L (ref 0–44)
AST: 18 U/L (ref 15–41)
Albumin: 4.6 g/dL (ref 3.5–5.0)
Alkaline Phosphatase: 54 U/L (ref 38–126)
Anion gap: 10 (ref 5–15)
BUN: 17 mg/dL (ref 8–23)
CO2: 25 mmol/L (ref 22–32)
Calcium: 10 mg/dL (ref 8.9–10.3)
Chloride: 101 mmol/L (ref 98–111)
Creatinine, Ser: 1.03 mg/dL — ABNORMAL HIGH (ref 0.44–1.00)
GFR, Estimated: 55 mL/min — ABNORMAL LOW (ref >60)
Glucose, Bld: 141 mg/dL — ABNORMAL HIGH (ref 70–99)
Potassium: 4.3 mmol/L (ref 3.5–5.1)
Sodium: 136 mmol/L (ref 135–145)
Total Bilirubin: 0.7 mg/dL (ref 0.3–1.2)
Total Protein: 7.9 g/dL (ref 6.5–8.1)

## 2021-03-11 NOTE — Progress Notes (Signed)
Pt in for follow up, continues to lose weight but states she feels appetite is good.

## 2021-03-11 NOTE — Progress Notes (Signed)
[place] CONSULT NOTE  Patient Care Team: Venia Carbon, MD as PCP - General Telford Nab, RN as Oncology Nurse Navigator  CHIEF COMPLAINTS/PURPOSE OF CONSULTATION: lung cancer  #  Oncology History Overview Note  1. Unchanged spiculated mass of the peripheral left upper lobe measuring 2.6 x 1.7 cm, previously FDG avid. Findings remain consistent with primary lung malignancy. 2. Unchanged 3 mm subpleural nodule of the posterior right upper lobe, nonspecific, although statistically likely to be benign sequelae of prior infection or inflammation. 3. Moderate to severe centrilobular emphysema. 4. Coronary artery disease.  LUNG, LEFT UPPER LOBE; ENB-ASSISTED BIOPSY:  - NON-SMALL CELL CARCINOMA, FAVOR ADENOCARCINOMA.   # FEB 2022- Stage IA- adeno ca; LUL [s/p Bronc; Dr.G]; DECLINES SURGERY.   There is sufficient material for limited ancillary molecular studies.   # # SURVIVORSHIP:   # GENETICS:   DIAGNOSIS: LUL LUNG CA  STAGE:  I       ;  GOALS: curative/control  CURRENT/MOST RECENT THERAPY :  SBRT   Primary cancer of left upper lobe of lung ([place])  12/10/2020 Initial Diagnosis   Primary cancer of left upper lobe of lung ([place])   12/10/2020 Cancer Staging   Staging form: Lung, AJCC 8th Edition - Clinical stage from 12/10/2020: Stage IA3 (cT1c, cN0, cM0) - Signed by Cammie Sickle, MD on 12/10/2020 Histopathologic type: Adenocarcinoma, NOS Stage prefix: Initial diagnosis Histologic grade (G): GX Histologic grading system: 4 grade system      HISTORY OF PRESENTING ILLNESS:  Kathryn Ware 80 y.o.  female with left upper lobe stage I non-small cell, favoring adenocarcinoma, declined surgery, s/p SBRT completed 01/12/2021.  She has recovered well from radiation and denies complaints.  Shortness of breath is improved.  Fatigue is improved.  She continues to smoke but is cut back.  No cough, hemoptysis, chest tightness.   Review of Systems   Constitutional: Negative for chills, diaphoresis, fever, malaise/fatigue and weight loss.  HENT: Negative for nosebleeds and sore throat.   Eyes: Negative for double vision.  Respiratory: Negative for cough, hemoptysis, sputum production, shortness of breath and wheezing.   Cardiovascular: Negative for chest pain, palpitations, orthopnea and leg swelling.  Gastrointestinal: Negative for abdominal pain, blood in stool, constipation, diarrhea, heartburn, melena, nausea and vomiting.  Genitourinary: Negative for dysuria, frequency and urgency.  Musculoskeletal: Positive for back pain and joint pain.  Skin: Negative.  Negative for itching and rash.  Neurological: Negative for dizziness, tingling, focal weakness, weakness and headaches.  Endo/Heme/Allergies: Does not bruise/bleed easily.  Psychiatric/Behavioral: Negative for depression. The patient is not nervous/anxious and does not have insomnia.      MEDICAL HISTORY:  Past Medical History:  Diagnosis Date  . Anemia   . Aortic atherosclerosis ([place])   . Blood transfusion without reported diagnosis   . CAD (coronary artery disease)   . CHF (congestive heart failure) ([place])   . CKD (chronic kidney disease), stage III ([place])   . Diabetes mellitus type II   . GERD (gastroesophageal reflux disease)   . GI (gastrointestinal bleed)   . GI AVM (gastrointestinal arteriovenous vascular malformation)    stomach  . History of echocardiogram 12/2013   a. 12/2013: EF 60-65%, DD, mild LVH, nl RV size & systolic function, mild MR/TR, mild AS, nl RVSP; b. TTE 04/2017: EF 60-65%, normal wall motion, GR1DD, mild MR, left atrium normal in size, normal RV systolic function, PASP normal   . History of nuclear stress test  a. 12/2013: low risk, no sig WMA, nondiag EKG 2/2 baseline LVH w/ repol abnl, no sig ischemia, EF 70% no artifact  . HLD (hyperlipidemia)   . HTN (hypertension)   . Hypercholesterolemia   . IDA (iron deficiency anemia)   . Mild cognitive  impairment   . PAD (peripheral artery disease) ([place])   . Polyp of colon, adenomatous   . RLS (restless legs syndrome)   . TIA (transient ischemic attack) 2021    SURGICAL HISTORY: Past Surgical History:  Procedure Laterality Date  . ABDOMINAL ADHESION SURGERY  11/2001   Dr. Tamala Julian  . ABDOMINAL HYSTERECTOMY  1976   RSO (fibroid)  . COLONOSCOPY WITH PROPOFOL N/A 07/07/2016   Procedure: COLONOSCOPY WITH PROPOFOL;  Surgeon: Manya Silvas, MD;  Location[facility] ENDOSCOPY;  Service: Endoscopy;  Laterality: N/A;  . COLONOSCOPY WITH PROPOFOL N/A 12/14/2016   Procedure: COLONOSCOPY WITH PROPOFOL;  Surgeon: Manya Silvas, MD;  Location: [facility] ENDOSCOPY;  Service: Endoscopy;  Laterality: N/A;  . DOBUTAMINE STRESS ECHO  11/10   normal  . DOPPLER ECHOCARDIOGRAPHY     EF 55%, mild MR, TR  . ENTEROSCOPY N/A 06/08/2018   Procedure: ENTEROSCOPY;  Surgeon: Lin Landsman, MD;  Location: [facility] ENDOSCOPY;  Service: Gastroenterology;  Laterality: N/A;  . ENTEROSCOPY N/A 01/06/2020   Procedure: ENTEROSCOPY;  Surgeon: Lin Landsman, MD;  Location: [facility] ENDOSCOPY;  Service: Gastroenterology;  Laterality: N/A;  . ENTEROSCOPY N/A 08/22/2020   Procedure: ENTEROSCOPY;  Surgeon: [name], MD;  Location: [facility] ENDOSCOPY;  Service: Gastroenterology;  Laterality: N/A;  . ESOPHAGOGASTRODUODENOSCOPY  07/2006   multiple angioectasias  . ESOPHAGOGASTRODUODENOSCOPY Left 03/12/2015   Procedure: place tube in throat and evaluate stomach and duodenum for source of bleeding. Cauterize if concern for rebleeding.;  Surgeon: Hulen Luster, MD;  Location: [facility] ENDOSCOPY;  Service: Endoscopy;  Laterality: Left;  . ESOPHAGOGASTRODUODENOSCOPY N/A 12/14/2016   Procedure: ESOPHAGOGASTRODUODENOSCOPY (EGD);  Surgeon: Manya Silvas, MD;  Location: [facility] ENDOSCOPY;  Service: Endoscopy;  Laterality: N/A;  . ESOPHAGOGASTRODUODENOSCOPY N/A 07/19/2017   Procedure: ESOPHAGOGASTRODUODENOSCOPY (EGD);  Surgeon: Lin Landsman, MD;   Location: [facility] ENDOSCOPY;  Service: Gastroenterology;  Laterality: N/A;  . ESOPHAGOGASTRODUODENOSCOPY (EGD) WITH PROPOFOL N/A 11/23/2015   Procedure: ESOPHAGOGASTRODUODENOSCOPY (EGD) WITH PROPOFOL;  Surgeon: Hulen Luster, MD;  Location: [facility] ENDOSCOPY;  Service: Gastroenterology;  Laterality: N/A;  . ESOPHAGOGASTRODUODENOSCOPY (EGD) WITH PROPOFOL N/A 07/07/2016   Procedure: ESOPHAGOGASTRODUODENOSCOPY (EGD) WITH PROPOFOL;  Surgeon: Manya Silvas, MD;  Location: [facility] ENDOSCOPY;  Service: Endoscopy;  Laterality: N/A;  . ESOPHAGOGASTRODUODENOSCOPY (EGD) WITH PROPOFOL N/A 05/19/2017   Procedure: ESOPHAGOGASTRODUODENOSCOPY (EGD) WITH PROPOFOL;  Surgeon: Manya Silvas, MD;  Location[facility] ENDOSCOPY;  Service: Endoscopy;  Laterality: N/A;  . ESOPHAGOGASTRODUODENOSCOPY (EGD) WITH PROPOFOL N/A 06/26/2020   Procedure: ESOPHAGOGASTRODUODENOSCOPY (EGD) WITH PROPOFOL;  Surgeon: Lucilla Lame, MD;  Location: [facility] ENDOSCOPY;  Service: Endoscopy;  Laterality: N/A;  . laryngeal polyp  10/2008   Dr. Tami Ribas  . TOP    . VIDEO BRONCHOSCOPY WITH ENDOBRONCHIAL NAVIGATION Left 12/02/2020   Procedure: VIDEO BRONCHOSCOPY WITH ENDOBRONCHIAL NAVIGATION;  Surgeon: Tyler Pita, MD;  Location: [facility] ORS;  Service: Pulmonary;  Laterality: Left;    SOCIAL HISTORY: Social History   Socioeconomic History  . Marital status: Widowed    Spouse name: Not on file  . Number of children: 1  . Years of education: Not on file  . Highest education level: Not on file  Occupational History  . Occupation: Regulatory affairs officer    Comment: retired  Tobacco Use  . Smoking status: Current Every Day Smoker    Packs/day: 1.50    Years: 63.00    Pack years: 94.50    Types: Cigarettes  . Smokeless tobacco: Never Used  . Tobacco comment: 1 PPD 12/24/2020  Vaping Use  . Vaping Use: Never used  Substance and Sexual Activity  . Alcohol use: No  . Drug use: No  . Sexual activity: Not on file  Other Topics Concern  . Not on file  Social History  Narrative   Lives with sister; in [place] since 26 years; 1/2 ppd; no alcohol. Used to work in [place].       No living will   Requests son Orson Gear as health care POA   Would accept resuscitation --but no prolonged machines   Not sure about feeding tubes   Social Determinants of Health   Financial Resource Strain: Not on file  Food Insecurity: Not on file  Transportation Needs: Not on file  Physical Activity: Not on file  Stress: Not on file  Social Connections: Not on file  Intimate Partner Violence: Not on file    FAMILY HISTORY: Family History  Problem Relation Age of Onset  . Stroke Father   . Cancer Sister        breast cancer  . Hypertension Mother   . Hypertension Other        sibling  . Diabetes Other        grandmother    ALLERGIES:  is allergic to nifedipine and metformin.  MEDICATIONS:  Current Outpatient Medications  Medication Sig Dispense Refill  . albuterol (VENTOLIN HFA) 108 (90 Base) MCG/ACT inhaler Inhale 2 puffs into the lungs every 6 (six) hours as needed for wheezing or shortness of breath. 8.5 g 0  . clonazePAM (KLONOPIN) 0.5 MG tablet Take 1-2 tablets (0.5-1 mg total) by mouth at bedtime. 60 tablet 0  . ferrous sulfate 325 (65 FE) MG tablet Take 325 mg by mouth daily with breakfast.    . glucose blood (ONETOUCH VERIO) test strip Use to check blood sugar once daily E11.69 100 each 12  . lisinopril-hydrochlorothiazide (ZESTORETIC) 20-12.5 MG tablet TAKE 2 TABLETS BY MOUTH DAILY (Patient taking differently: Take by mouth daily. Take 1 tablet by  mouth daily) 180 tablet 3  . metFORMIN (GLUCOPHAGE-XR) 500 MG 24 hr tablet TAKE 1 TABLET(500 MG) BY MOUTH DAILY WITH BREAKFAST 90 tablet 0  . omeprazole (PRILOSEC) 40 MG capsule Take 1 capsule (40 mg total) by mouth in the morning and at bedtime. 180 capsule 3  . OneTouch Delica Lancets 44R MISC 1 each by Does not apply route daily. Use to obtain blood sugar sample daily E11.69 100 each 12  .  simvastatin (ZOCOR) 80 MG tablet TAKE 1 TABLET(80 MG) BY MOUTH DAILY 90 tablet 3  . vitamin B-12 (CYANOCOBALAMIN) 1000 MCG tablet Take 1,000 mcg by mouth daily.     No current facility-administered medications for this visit.      PHYSICAL EXAMINATION: ECOG PERFORMANCE STATUS: 1 - Symptomatic but completely ambulatory  Vitals:   03/11/21 1100  BP: (!) 146/50  Pulse: 77  Resp: 18  Temp: 97.6 F (36.4 C)  SpO2: 99%   Filed Weights   03/11/21 1100  Weight: 93 lb 3.2 oz (42.3 kg)    Physical Exam Constitutional:      Comments: Wheelchair. Accompanied by daughter.  HENT:     Head: Normocephalic and atraumatic.     Mouth/Throat:  Pharynx: No oropharyngeal exudate.  Eyes:     General: No scleral icterus.    Conjunctiva/sclera: Conjunctivae normal.  Cardiovascular:     Rate and Rhythm: Normal rate and regular rhythm.  Pulmonary:     Effort: Pulmonary effort is normal. No respiratory distress.     Breath sounds: No wheezing.  Abdominal:     General: Bowel sounds are normal. There is no distension.     Palpations: Abdomen is soft. There is no mass.     Tenderness: There is no abdominal tenderness. There is no guarding or rebound.  Musculoskeletal:        General: No tenderness. Normal range of motion.     Cervical back: Normal range of motion and neck supple.  Skin:    General: Skin is warm and dry.  Neurological:     General: No focal deficit present.     Mental Status: She is alert and oriented to person, place, and time.  Psychiatric:        Mood and Affect: Mood and affect normal.        Behavior: Behavior normal.      LABORATORY DATA:  I have reviewed the data as listed Lab Results  Component Value Date   WBC 3.7 (L) 03/11/2021   HGB 11.1 (L) 03/11/2021   HCT 34.5 (L) 03/11/2021   MCV 84.6 03/11/2021   PLT 311 03/11/2021   Recent Labs    06/24/20 1702 06/25/20 0500 06/26/20 0335 06/30/20 1254 08/22/20 0306 08/23/20 0407 09/01/20 1146  11/25/20 1126  NA 141 140 140 142 138 139 140 139  K 4.6 4.1 3.6 4.7 4.8 4.2 4.5 3.8  CL 109 111 113* 106 108 110 104 104  CO2 19* 20* 19* 28 21* 23 30 24   GLUCOSE 151* 91 74 105* 199* 116* 92 81  BUN 41* 40* 24* 9 36* 19 14 14   CREATININE 1.25* 1.15* 0.89 0.97 1.26* 0.88 0.96 0.89  CALCIUM 10.2 9.0 8.5* 10.4 9.3 9.0 9.9 10.0  GFRNONAA 41* 46* >60  --  44* >60  --  >60  GFRAA 48* 53* >60  --   --   --   --   --   PROT  --   --   --   --  6.9  --   --   --   ALBUMIN  --   --   --  4.1 3.9  --  4.2  --   AST  --   --   --   --  24  --   --   --   ALT  --   --   --   --  8  --   --   --   ALKPHOS  --   --   --   --  40  --   --   --   BILITOT  --   --   --   --  0.6  --   --   --     RADIOGRAPHIC STUDIES: I have personally reviewed the radiological images as listed and agreed with the findings in the report. No results found.  ASSESSMENT & PLAN:   1.  Stage I non-small cell, favoring adenocarcinoma, cancer of the left upper lobe of lung-declined surgery.  Completed SBRT nearly 2 months ago.  Reviewed surveillance guidelines including exam and chest CT +/-contrast every 3-6 months for 3 years then every 6 months for 2 years then LDCT annually.  Residual or new radiographic abnormalities may require more frequent imaging.   2. Hypermetabolic right parotid uptake on pet 11/13/20. Incidental. Asymptomatic. Patient unsure if she was seen. I called [facility] and they have not seen patient since 2015. I will send referral today.   3. Iron Deficiency Anemia- secondary to chronic GI Bleed/AVMs. Hemoglobin 11.1. Stable. No microcytosis. Clinically, no bleeding. Continue PO iron. Hold venofer.   4. Smoking cessation- cutting back. Offered referral to [facility] for smoking cessation or self referral to [facility] with Burgess Estelle. Patient will consider and let us know.   Disposition:  RTC in 3 months for labs (cbc, cmp, ferritin, iron studies), MD, possible venofer Send referral to  ENT  No problem-specific Assessment & Plan notes found for this encounter.  All questions were answered. The patient knows to call the clinic with any problems, questions or concerns.    Verlon Au, NP 03/12/2021 10:59 AM

## 2021-03-29 ENCOUNTER — Telehealth: Payer: Self-pay | Admitting: Pulmonary Disease

## 2021-03-29 NOTE — Telephone Encounter (Signed)
Lm for patient.  

## 2021-03-30 NOTE — Telephone Encounter (Signed)
Patient stated that she was admitted at [facility] in February. Per our records, patient had a bronch 11/2020 but was not admitted. Upon further discussion, patient stated that she thought oncology mentioned this and she will reach out to them for further recommendations.  Nothing further needed at this time.

## 2021-03-30 NOTE — Telephone Encounter (Signed)
Spoke to patient, who stated that during her recent admission it was mentioned that someone would be in contact with her regarding a lump in her thoart.  Patient is unsure which provider mentioned this.  She is questioning if this was discussed with Dr. Patsey Berthold.  Dr. Patsey Berthold, please advise. Thanks

## 2021-03-30 NOTE — Telephone Encounter (Signed)
Unfortunately, I am not sure what she is referring to.  I also do not see where she has been admitted at least not to our facility.  Need more information.

## 2021-04-09 ENCOUNTER — Other Ambulatory Visit: Payer: Self-pay

## 2021-04-09 MED ORDER — CLONAZEPAM 0.5 MG PO TABS: 0.5000 mg | ORAL_TABLET | Freq: Every day | ORAL | 0 refills | Status: DC

## 2021-04-09 NOTE — Telephone Encounter (Signed)
Name of Medication: Clonazepam 0.5 mg Name of Pharmacy: [facility] Last Fill or Written Date and Quantity: 03/10/21 #60 Last Office Visit and Type: 09/01/2020 HFU Next Office Visit and Type: none scheduled

## 2021-04-09 NOTE — Telephone Encounter (Signed)
Pt called triage and requested a refill

## 2021-04-27 DIAGNOSIS — H524 Presbyopia: Secondary | ICD-10-CM | POA: Diagnosis not present

## 2021-04-27 DIAGNOSIS — E119 Type 2 diabetes mellitus without complications: Secondary | ICD-10-CM | POA: Diagnosis not present

## 2021-04-27 LAB — HM DIABETES EYE EXAM

## 2021-05-10 ENCOUNTER — Other Ambulatory Visit: Payer: Self-pay

## 2021-05-10 MED ORDER — CLONAZEPAM 0.5 MG PO TABS: 0.5000 mg | ORAL_TABLET | Freq: Every day | ORAL | 0 refills | Status: DC

## 2021-05-10 NOTE — Telephone Encounter (Signed)
Name of Medication: Clonazepam 0.5 mg Name of Pharmacy: [facility] Last Fill or Written Date and Quantity: 04-07-21 #60 Last Office Visit and Type: 09/01/2020 HFU Next Office Visit and Type: 05-27-21

## 2021-05-12 ENCOUNTER — Other Ambulatory Visit: Payer: Self-pay

## 2021-05-12 ENCOUNTER — Ambulatory Visit
Admission: RE | Admit: 2021-05-12 | Discharge: 2021-05-12 | Disposition: A | Discharge status: Home / Self Care | Payer: Medicare Other | Source: Ambulatory Visit | Attending: Radiation Oncology | Admitting: Radiation Oncology

## 2021-05-12 DIAGNOSIS — J439 Emphysema, unspecified: Secondary | ICD-10-CM | POA: Diagnosis not present

## 2021-05-12 DIAGNOSIS — R911 Solitary pulmonary nodule: Secondary | ICD-10-CM | POA: Diagnosis not present

## 2021-05-12 DIAGNOSIS — C3412 Malignant neoplasm of upper lobe, left bronchus or lung: Secondary | ICD-10-CM | POA: Insufficient documentation

## 2021-05-12 DIAGNOSIS — I700 Atherosclerosis of aorta: Secondary | ICD-10-CM | POA: Diagnosis not present

## 2021-05-12 DIAGNOSIS — C3490 Malignant neoplasm of unspecified part of unspecified bronchus or lung: Secondary | ICD-10-CM | POA: Diagnosis not present

## 2021-05-12 LAB — POCT I-STAT CREATININE: Creatinine, Ser: 1.1 mg/dL — ABNORMAL HIGH (ref 0.44–1.00)

## 2021-05-12 MED ORDER — IOHEXOL 300 MG/ML  SOLN
60.0000 mL | Freq: Once | INTRAMUSCULAR | Status: AC | PRN
  Administered 2021-05-12: 60 mL via INTRAVENOUS

## 2021-05-17 ENCOUNTER — Ambulatory Visit
Admission: RE | Admit: 2021-05-17 | Discharge: 2021-05-17 | Disposition: A | Discharge status: Home / Self Care | Payer: Medicare Other | Source: Ambulatory Visit | Attending: Radiation Oncology | Admitting: Radiation Oncology

## 2021-05-17 ENCOUNTER — Other Ambulatory Visit: Payer: Self-pay

## 2021-05-17 VITALS — BP 155/66 | HR 81 | Temp 97.6°F | Resp 16 | Wt 94.5 lb

## 2021-05-17 DIAGNOSIS — C3412 Malignant neoplasm of upper lobe, left bronchus or lung: Secondary | ICD-10-CM | POA: Diagnosis not present

## 2021-05-17 DIAGNOSIS — F17210 Nicotine dependence, cigarettes, uncomplicated: Secondary | ICD-10-CM | POA: Diagnosis not present

## 2021-05-17 DIAGNOSIS — Z923 Personal history of irradiation: Secondary | ICD-10-CM | POA: Insufficient documentation

## 2021-05-17 DIAGNOSIS — Z08 Encounter for follow-up examination after completed treatment for malignant neoplasm: Secondary | ICD-10-CM | POA: Diagnosis not present

## 2021-05-17 DIAGNOSIS — R630 Anorexia: Secondary | ICD-10-CM | POA: Diagnosis not present

## 2021-05-17 NOTE — Progress Notes (Signed)
Radiation Oncology Follow up Note  Name: Kathryn Ware   Date:   05/17/2021 MRN:  [id number] DOB: 27-Oct-1940    This 80 y.o. female presents to the clinic today for 64-month follow-up status post SBRT to left upper lobe for adenocarcinoma stage I.  REFERRING PROVIDER: Venia Carbon, MD  HPI: Patient is a 80 year old female now about 4 months having completed SBRT to her left upper lobe for stage I adenocarcinoma seen today in routine follow-up she is doing well.  She specifically [place] cough hemoptysis or chest tightness.  She is a little anorexic.Marland Kitchen  Her recent CT scan showed decreased size of spiculated nodule peripheral left upper lobe now measuring 1.9 cm.  No other evidence of progressive disease is noted.  COMPLICATIONS OF TREATMENT: none  FOLLOW UP COMPLIANCE: keeps appointments   PHYSICAL EXAM:  BP (!) 155/66   Pulse 81   Temp 97.6 F (36.4 C) (Tympanic)   Resp 16   Wt 94 lb 8 oz (42.9 kg)   LMP  (LMP Unknown)   BMI 15.25 kg/m  Well-developed well-nourished patient in NAD. HEENT reveals PERLA, EOMI, discs not visualized.  Oral cavity is clear. No oral mucosal lesions are identified. Neck is clear without evidence of cervical or supraclavicular adenopathy. Lungs are clear to A&P. Cardiac examination is essentially unremarkable with regular rate and rhythm without murmur rub or thrill. Abdomen is benign with no organomegaly or masses noted. Motor sensory and DTR levels are equal and symmetric in the upper and lower extremities. Cranial nerves II through XII are grossly intact. Proprioception is intact. No peripheral adenopathy or edema is identified. No motor or sensory levels are noted. Crude visual fields are within normal range.  RADIOLOGY RESULTS: CT scan reviewed compatible with above-stated findings  PLAN: Present time patient is now at 4 months from SBRT to her left upper lobe.  She has very low side effect profile.  She has CT evidence of excellent response.  I am pleased  with her overall progress.  Of asked to see her back in 6 months with a CT scan at that time.  I have asked her to try to increase her nutritional intake.  Patient is to call with any concerns.  I would like to take this opportunity to thank you for allowing me to participate in the care of your patient.[name], MD

## 2021-05-19 ENCOUNTER — Other Ambulatory Visit: Payer: Self-pay | Admitting: Internal Medicine

## 2021-05-26 ENCOUNTER — Telehealth: Payer: Self-pay | Admitting: Internal Medicine

## 2021-05-26 NOTE — Chronic Care Management (AMB) (Signed)
  Chronic Care Management   Note  05/26/2021 Name: CHAMIA SCHMUTZ MRN: [id number] DOB: 12-22-1940  Jasmine Awe Schnackenberg is a 80 y.o. year old female who is a primary care patient of [place], Theophilus Kinds, MD. I reached out to [name] by phone today in response to a referral sent by Ms. Scherrie November PCP, Venia Carbon, MD.   Ms. Apt was given information about Chronic Care Management services today including:  CCM service includes personalized support from designated clinical staff supervised by her physician, including individualized plan of care and coordination with other care providers 24/7 contact phone numbers for assistance for urgent and routine care needs. Service will only be billed when office clinical staff spend 20 minutes or more in a month to coordinate care. Only one practitioner may furnish and bill the service in a calendar month. The patient may stop CCM services at any time (effective at the end of the month) by phone call to the office staff.   Patient agreed to services and verbal consent obtained.   Follow up plan:   Tatjana Secretary/administrator

## 2021-06-09 ENCOUNTER — Other Ambulatory Visit: Payer: Self-pay | Admitting: *Deleted

## 2021-06-09 MED ORDER — CLONAZEPAM 0.5 MG PO TABS: 0.5000 mg | ORAL_TABLET | Freq: Every day | ORAL | 0 refills | Status: DC

## 2021-06-09 MED ORDER — OMEPRAZOLE 40 MG PO CPDR: 40.0000 mg | DELAYED_RELEASE_CAPSULE | Freq: Two times a day (BID) | ORAL | 3 refills | Status: AC

## 2021-06-09 NOTE — Telephone Encounter (Signed)
Patient left a voicemail stating that she needs refills on Clonazepam and Omeprazole.

## 2021-06-09 NOTE — Telephone Encounter (Signed)
I sent in the omeprazole.  Name of Medication: Clonazepam 0.5 mg Name of Pharmacy: [facility] Last Fill or Written Date and Quantity: 05-10-21-22 #60 Last Office Visit and Type: 09/01/2020 HFU No Future OV

## 2021-06-10 ENCOUNTER — Inpatient Hospital Stay: Payer: Medicare Other | Attending: Nurse Practitioner

## 2021-06-10 ENCOUNTER — Encounter: Payer: Self-pay | Admitting: Nurse Practitioner

## 2021-06-10 ENCOUNTER — Inpatient Hospital Stay: Payer: Medicare Other

## 2021-06-10 ENCOUNTER — Inpatient Hospital Stay (HOSPITAL_BASED_OUTPATIENT_CLINIC_OR_DEPARTMENT_OTHER): Payer: Medicare Other | Admitting: Nurse Practitioner

## 2021-06-10 ENCOUNTER — Telehealth: Payer: Self-pay | Admitting: *Deleted

## 2021-06-10 VITALS — BP 159/69 | HR 73 | Temp 96.2°F | Resp 16 | Wt 95.0 lb

## 2021-06-10 DIAGNOSIS — Z87891 Personal history of nicotine dependence: Secondary | ICD-10-CM | POA: Diagnosis not present

## 2021-06-10 DIAGNOSIS — Z7984 Long term (current) use of oral hypoglycemic drugs: Secondary | ICD-10-CM | POA: Insufficient documentation

## 2021-06-10 DIAGNOSIS — Z90721 Acquired absence of ovaries, unilateral: Secondary | ICD-10-CM | POA: Diagnosis not present

## 2021-06-10 DIAGNOSIS — Z8673 Personal history of transient ischemic attack (TIA), and cerebral infarction without residual deficits: Secondary | ICD-10-CM | POA: Diagnosis not present

## 2021-06-10 DIAGNOSIS — C3412 Malignant neoplasm of upper lobe, left bronchus or lung: Secondary | ICD-10-CM | POA: Insufficient documentation

## 2021-06-10 DIAGNOSIS — E119 Type 2 diabetes mellitus without complications: Secondary | ICD-10-CM | POA: Diagnosis not present

## 2021-06-10 DIAGNOSIS — F17210 Nicotine dependence, cigarettes, uncomplicated: Secondary | ICD-10-CM | POA: Diagnosis not present

## 2021-06-10 DIAGNOSIS — C3490 Malignant neoplasm of unspecified part of unspecified bronchus or lung: Secondary | ICD-10-CM

## 2021-06-10 DIAGNOSIS — K118 Other diseases of salivary glands: Secondary | ICD-10-CM

## 2021-06-10 DIAGNOSIS — D649 Anemia, unspecified: Secondary | ICD-10-CM

## 2021-06-10 DIAGNOSIS — D509 Iron deficiency anemia, unspecified: Secondary | ICD-10-CM | POA: Diagnosis not present

## 2021-06-10 DIAGNOSIS — Z79899 Other long term (current) drug therapy: Secondary | ICD-10-CM | POA: Diagnosis not present

## 2021-06-10 DIAGNOSIS — I130 Hypertensive heart and chronic kidney disease with heart failure and stage 1 through stage 4 chronic kidney disease, or unspecified chronic kidney disease: Secondary | ICD-10-CM | POA: Insufficient documentation

## 2021-06-10 DIAGNOSIS — E041 Nontoxic single thyroid nodule: Secondary | ICD-10-CM | POA: Diagnosis not present

## 2021-06-10 DIAGNOSIS — N183 Chronic kidney disease, stage 3 unspecified: Secondary | ICD-10-CM | POA: Diagnosis not present

## 2021-06-10 DIAGNOSIS — I509 Heart failure, unspecified: Secondary | ICD-10-CM | POA: Insufficient documentation

## 2021-06-10 LAB — FERRITIN: Ferritin: 13 ng/mL (ref 11–307)

## 2021-06-10 LAB — CBC WITH DIFFERENTIAL/PLATELET
Abs Immature Granulocytes: 0.01 10*3/uL (ref 0.00–0.07)
Basophils Absolute: 0 10*3/uL (ref 0.0–0.1)
Basophils Relative: 1 %
Eosinophils Absolute: 0.1 10*3/uL (ref 0.0–0.5)
Eosinophils Relative: 2 %
HCT: 34 % — ABNORMAL LOW (ref 36.0–46.0)
Hemoglobin: 10.5 g/dL — ABNORMAL LOW (ref 12.0–15.0)
Immature Granulocytes: 0 %
Lymphocytes Relative: 26 %
Lymphs Abs: 0.9 10*3/uL (ref 0.7–4.0)
MCH: 26.4 pg (ref 26.0–34.0)
MCHC: 30.9 g/dL (ref 30.0–36.0)
MCV: 85.4 fL (ref 80.0–100.0)
Monocytes Absolute: 0.4 10*3/uL (ref 0.1–1.0)
Monocytes Relative: 12 %
Neutro Abs: 2.1 10*3/uL (ref 1.7–7.7)
Neutrophils Relative %: 59 %
Platelets: 251 10*3/uL (ref 150–400)
RBC: 3.98 MIL/uL (ref 3.87–5.11)
RDW: 13.9 % (ref 11.5–15.5)
WBC: 3.5 10*3/uL — ABNORMAL LOW (ref 4.0–10.5)
nRBC: 0 % (ref 0.0–0.2)

## 2021-06-10 LAB — COMPREHENSIVE METABOLIC PANEL
ALT: 8 U/L (ref 0–44)
AST: 17 U/L (ref 15–41)
Albumin: 4.4 g/dL (ref 3.5–5.0)
Alkaline Phosphatase: 62 U/L (ref 38–126)
Anion gap: 9 (ref 5–15)
BUN: 19 mg/dL (ref 8–23)
CO2: 26 mmol/L (ref 22–32)
Calcium: 9.7 mg/dL (ref 8.9–10.3)
Chloride: 103 mmol/L (ref 98–111)
Creatinine, Ser: 1.12 mg/dL — ABNORMAL HIGH (ref 0.44–1.00)
GFR, Estimated: 50 mL/min — ABNORMAL LOW (ref >60)
Glucose, Bld: 106 mg/dL — ABNORMAL HIGH (ref 70–99)
Potassium: 4.3 mmol/L (ref 3.5–5.1)
Sodium: 138 mmol/L (ref 135–145)
Total Bilirubin: 0.5 mg/dL (ref 0.3–1.2)
Total Protein: 7.9 g/dL (ref 6.5–8.1)

## 2021-06-10 LAB — IRON AND TIBC
Iron: 90 ug/dL (ref 28–170)
Saturation Ratios: 22 % (ref 10.4–31.8)
TIBC: 407 ug/dL (ref 250–450)
UIBC: 317 ug/dL

## 2021-06-10 NOTE — Progress Notes (Signed)
[place] NOTE  Patient Care Team: Venia Carbon, MD as PCP - General Telford Nab, RN as Oncology Nurse Navigator Charlton Haws, [facility] as Pharmacist (Pharmacist)  CHIEF COMPLAINTS/PURPOSE OF CONSULTATION: lung cancer  #  Oncology History Overview Note  1. Unchanged spiculated mass of the peripheral left upper lobe measuring 2.6 x 1.7 cm, previously FDG avid. Findings remain consistent with primary lung malignancy. 2. Unchanged 3 mm subpleural nodule of the posterior right upper lobe, nonspecific, although statistically likely to be benign sequelae of prior infection or inflammation. 3. Moderate to severe centrilobular emphysema. 4. Coronary artery disease.  LUNG, LEFT UPPER LOBE; ENB-ASSISTED BIOPSY:  - NON-SMALL CELL CARCINOMA, FAVOR ADENOCARCINOMA.   # FEB 2022- Stage IA- adeno ca; LUL [s/p Bronc; Dr.G]; DECLINES SURGERY.   There is sufficient material for limited ancillary molecular studies.   # # SURVIVORSHIP:   # GENETICS:   DIAGNOSIS: LUL LUNG CA  STAGE:  I       ;  GOALS: curative/control  CURRENT/MOST RECENT THERAPY :  SBRT   Primary cancer of left upper lobe of lung ([place])  12/10/2020 Initial Diagnosis   Primary cancer of left upper lobe of lung ([place])   12/10/2020 Cancer Staging   Staging form: Lung, AJCC 8th Edition - Clinical stage from 12/10/2020: Stage IA3 (cT1c, cN0, cM0) - Signed by Cammie Sickle, MD on 12/10/2020 Histopathologic type: Adenocarcinoma, NOS Stage prefix: Initial diagnosis Histologic grade (G): GX Histologic grading system: 4 grade system      HISTORY OF PRESENTING ILLNESS:  Kathryn Ware 80 y.o.  female who returns to clinic for continued surveillance of stage I non-small cell, favoring adenocarcinoma Left upper lobe cancer. She declined surgery and received SBRT completed 01/12/21. She has recovered from radiation well and denies complaints. Fatigue and shortness of breath have resolved. No  cough, hemoptysis or chest paint. Weight is up. She has not seen ENT. She takes iron pills daily.   Review of Systems  Constitutional:  Negative for chills, diaphoresis, fever, malaise/fatigue and weight loss.  HENT:  Negative for nosebleeds and sore throat.   Eyes:  Negative for double vision.  Respiratory:  Negative for cough, hemoptysis, sputum production, shortness of breath and wheezing.   Cardiovascular:  Negative for chest pain, palpitations, orthopnea and leg swelling.  Gastrointestinal:  Negative for abdominal pain, blood in stool, constipation, diarrhea, heartburn, melena, nausea and vomiting.  Genitourinary:  Negative for dysuria, frequency and urgency.  Musculoskeletal:  Positive for back pain and joint pain.  Skin: Negative.  Negative for itching and rash.  Neurological:  Negative for dizziness, tingling, focal weakness, weakness and headaches.  Endo/Heme/Allergies:  Does not bruise/bleed easily.  Psychiatric/Behavioral:  Negative for depression. The patient is not nervous/anxious and does not have insomnia.     MEDICAL HISTORY:  Past Medical History:  Diagnosis Date   Anemia    Aortic atherosclerosis ([place])    Blood transfusion without reported diagnosis    CAD (coronary artery disease)    CHF (congestive heart failure) (HCC)    CKD (chronic kidney disease), stage III (HCC)    Diabetes mellitus type II    GERD (gastroesophageal reflux disease)    GI (gastrointestinal bleed)    GI AVM (gastrointestinal arteriovenous vascular malformation)    stomach   History of echocardiogram 12/2013   a. 12/2013: EF 60-65%, DD, mild LVH, nl RV size & systolic function, mild MR/TR, mild AS, nl RVSP; b. TTE 04/2017: EF 60-65%, normal  wall motion, GR1DD, mild MR, left atrium normal in size, normal RV systolic function, PASP normal    History of nuclear stress test    a. 12/2013: low risk, no sig WMA, nondiag EKG 2/2 baseline LVH w/ repol abnl, no sig ischemia, EF 70% no artifact   HLD  (hyperlipidemia)    HTN (hypertension)    Hypercholesterolemia    IDA (iron deficiency anemia)    Mild cognitive impairment    PAD (peripheral artery disease) (HCC)    Polyp of colon, adenomatous    RLS (restless legs syndrome)    TIA (transient ischemic attack) 2021    SURGICAL HISTORY: Past Surgical History:  Procedure Laterality Date   ABDOMINAL ADHESION SURGERY  11/2001   Dr. Tamala Julian   ABDOMINAL HYSTERECTOMY  1976   RSO (fibroid)   COLONOSCOPY WITH PROPOFOL N/A 07/07/2016   Procedure: COLONOSCOPY WITH PROPOFOL;  Surgeon: Manya Silvas, MD;  Location: [facility] ENDOSCOPY;  Service: Endoscopy;  Laterality: N/A;   COLONOSCOPY WITH PROPOFOL N/A 12/14/2016   Procedure: COLONOSCOPY WITH PROPOFOL;  Surgeon: Manya Silvas, MD;  Location: [facility] ENDOSCOPY;  Service: Endoscopy;  Laterality: N/A;   DOBUTAMINE STRESS ECHO  11/10   normal   DOPPLER ECHOCARDIOGRAPHY     EF 55%, mild MR, TR   ENTEROSCOPY N/A 06/08/2018   Procedure: ENTEROSCOPY;  Surgeon: Lin Landsman, MD;  Location: [place];  Service: Gastroenterology;  Laterality: N/A;   ENTEROSCOPY N/A 01/06/2020   Procedure: ENTEROSCOPY;  Surgeon: Lin Landsman, MD;  Location: [facility] ENDOSCOPY;  Service: Gastroenterology;  Laterality: N/A;   ENTEROSCOPY N/A 08/22/2020   Procedure: ENTEROSCOPY;  Surgeon: [name], MD;  Location: [facility] ENDOSCOPY;  Service: Gastroenterology;  Laterality: N/A;   ESOPHAGOGASTRODUODENOSCOPY  07/2006   multiple angioectasias   ESOPHAGOGASTRODUODENOSCOPY Left 03/12/2015   Procedure: place tube in throat and evaluate stomach and duodenum for source of bleeding. Cauterize if concern for rebleeding.;  Surgeon: Hulen Luster, MD;  Location: [facility] ENDOSCOPY;  Service: Endoscopy;  Laterality: Left;   ESOPHAGOGASTRODUODENOSCOPY N/A 12/14/2016   Procedure: ESOPHAGOGASTRODUODENOSCOPY (EGD);  Surgeon: Manya Silvas, MD;  Location: [facility] ENDOSCOPY;  Service: Endoscopy;  Laterality: N/A;   ESOPHAGOGASTRODUODENOSCOPY  N/A 07/19/2017   Procedure: ESOPHAGOGASTRODUODENOSCOPY (EGD);  Surgeon: Lin Landsman, MD;  Location: [facility] ENDOSCOPY;  Service: Gastroenterology;  Laterality: N/A;   ESOPHAGOGASTRODUODENOSCOPY (EGD) WITH PROPOFOL N/A 11/23/2015   Procedure: ESOPHAGOGASTRODUODENOSCOPY (EGD) WITH PROPOFOL;  Surgeon: Hulen Luster, MD;  Location: [facility] ENDOSCOPY;  Service: Gastroenterology;  Laterality: N/A;   ESOPHAGOGASTRODUODENOSCOPY (EGD) WITH PROPOFOL N/A 07/07/2016   Procedure: ESOPHAGOGASTRODUODENOSCOPY (EGD) WITH PROPOFOL;  Surgeon: Manya Silvas, MD;  Location: [facility] ENDOSCOPY;  Service: Endoscopy;  Laterality: N/A;   ESOPHAGOGASTRODUODENOSCOPY (EGD) WITH PROPOFOL N/A 05/19/2017   Procedure: ESOPHAGOGASTRODUODENOSCOPY (EGD) WITH PROPOFOL;  Surgeon: Manya Silvas, MD;  Location: [facility] ENDOSCOPY;  Service: Endoscopy;  Laterality: N/A;   ESOPHAGOGASTRODUODENOSCOPY (EGD) WITH PROPOFOL N/A 06/26/2020   Procedure: ESOPHAGOGASTRODUODENOSCOPY (EGD) WITH PROPOFOL;  Surgeon: Lucilla Lame, MD;  Location: [facility] ENDOSCOPY;  Service: Endoscopy;  Laterality: N/A;   laryngeal polyp  10/2008   Dr. Tami Ribas   TOP     VIDEO BRONCHOSCOPY WITH ENDOBRONCHIAL NAVIGATION Left 12/02/2020   Procedure: VIDEO BRONCHOSCOPY WITH ENDOBRONCHIAL NAVIGATION;  Surgeon: Tyler Pita, MD;  Location: [facility] ORS;  Service: Pulmonary;  Laterality: Left;    SOCIAL HISTORY: Social History   Socioeconomic History   Marital status: Widowed    Spouse name: Not on file   Number of children: 1  Years of education: Not on file   Highest education level: Not on file  Occupational History   Occupation: Regulatory affairs officer    Comment: retired  Tobacco Use   Smoking status: Every Day    Packs/day: 1.50    Years: 63.00    Pack years: 94.50    Types: Cigarettes   Smokeless tobacco: Never   Tobacco comments:    1 PPD 12/24/2020  Vaping Use   Vaping Use: Never used  Substance and Sexual Activity   Alcohol use: No   Drug use: No   Sexual activity: Not  on file  Other Topics Concern   Not on file  Social History Narrative   Lives with sister; in [place] since 60 years; 1/2 ppd; no alcohol. Used to work in [place].       No living will   Requests son Orson Gear as health care POA   Would accept resuscitation --but no prolonged machines   Not sure about feeding tubes   Social Determinants of Health   Financial Resource Strain: Not on file  Food Insecurity: Not on file  Transportation Needs: Not on file  Physical Activity: Not on file  Stress: Not on file  Social Connections: Not on file  Intimate Partner Violence: Not on file    FAMILY HISTORY: Family History  Problem Relation Age of Onset   Stroke Father    Cancer Sister        breast cancer   Hypertension Mother    Hypertension Other        sibling   Diabetes Other        grandmother    ALLERGIES:  is allergic to nifedipine and metformin.  MEDICATIONS:  Current Outpatient Medications  Medication Sig Dispense Refill   albuterol (VENTOLIN HFA) 108 (90 Base) MCG/ACT inhaler Inhale 2 puffs into the lungs every 6 (six) hours as needed for wheezing or shortness of breath. 8.5 g 0   clonazePAM (KLONOPIN) 0.5 MG tablet Take 1-2 tablets (0.5-1 mg total) by mouth at bedtime. 60 tablet 0   ferrous sulfate 325 (65 FE) MG tablet Take 325 mg by mouth daily with breakfast.     glucose blood (ONETOUCH VERIO) test strip Use to check blood sugar once daily E11.69 100 each 12   lisinopril-hydrochlorothiazide (ZESTORETIC) 20-12.5 MG tablet TAKE 2 TABLETS BY MOUTH DAILY (Patient taking differently: Take by mouth daily. Take 1 tablet by  mouth daily) 180 tablet 3   metFORMIN (GLUCOPHAGE-XR) 500 MG 24 hr tablet TAKE 1 TABLET(500 MG) BY MOUTH DAILY WITH BREAKFAST 90 tablet 0   omeprazole (PRILOSEC) 40 MG capsule Take 1 capsule (40 mg total) by mouth in the morning and at bedtime. 180 capsule 3   OneTouch Delica Lancets 01B MISC 1 each by Does not apply route daily. Use to obtain  blood sugar sample daily E11.69 100 each 12   simvastatin (ZOCOR) 80 MG tablet TAKE 1 TABLET(80 MG) BY MOUTH DAILY 90 tablet 3   vitamin B-12 (CYANOCOBALAMIN) 1000 MCG tablet Take 1,000 mcg by mouth daily.     No current facility-administered medications for this visit.      PHYSICAL EXAMINATION: ECOG PERFORMANCE STATUS: 1 - Symptomatic but completely ambulatory  Vitals:   06/10/21 1049  BP: (!) 159/69  Pulse: 73  Resp: 16  Temp: (!) 96.2 F (35.7 C)  SpO2: 100%   Filed Weights   06/10/21 1049  Weight: 95 lb (43.1 kg)    Physical Exam Constitutional:  Comments: Wheelchair. Accompanied by daughter.  HENT:     Head: Normocephalic and atraumatic.     Mouth/Throat:     Pharynx: No oropharyngeal exudate.  Eyes:     General: No scleral icterus.    Conjunctiva/sclera: Conjunctivae normal.  Cardiovascular:     Rate and Rhythm: Normal rate and regular rhythm.  Pulmonary:     Effort: Pulmonary effort is normal. No respiratory distress.     Breath sounds: No wheezing.  Abdominal:     General: Bowel sounds are normal. There is no distension.     Palpations: Abdomen is soft. There is no mass.     Tenderness: There is no abdominal tenderness. There is no guarding or rebound.  Musculoskeletal:        General: No tenderness. Normal range of motion.     Cervical back: Normal range of motion and neck supple.  Skin:    General: Skin is warm and dry.  Neurological:     General: No focal deficit present.     Mental Status: She is alert and oriented to person, place, and time.  Psychiatric:        Mood and Affect: Mood and affect normal.        Behavior: Behavior normal.     LABORATORY DATA:  I have reviewed the data as listed Lab Results  Component Value Date   WBC 3.5 (L) 06/10/2021   HGB 10.5 (L) 06/10/2021   HCT 34.0 (L) 06/10/2021   MCV 85.4 06/10/2021   PLT 251 06/10/2021   Recent Labs    06/24/20 1702 06/25/20 0500 06/26/20 0335 06/30/20 1254  08/22/20 0306 08/23/20 0407 09/01/20 1146 11/25/20 1126 03/11/21 1026 05/12/21 1018 06/10/21 1022  NA 141 140 140   < > 138   < > 140 139 136  --  138  K 4.6 4.1 3.6   < > 4.8   < > 4.5 3.8 4.3  --  4.3  CL 109 111 113*   < > 108   < > 104 104 101  --  103  CO2 19* 20* 19*   < > 21*   < > 30 24 25   --  26  GLUCOSE 151* 91 74   < > 199*   < > 92 81 141*  --  106*  BUN 41* 40* 24*   < > 36*   < > 14 14 17   --  19  CREATININE 1.25* 1.15* 0.89   < > 1.26*   < > 0.96 0.89 1.03* 1.10* 1.12*  CALCIUM 10.2 9.0 8.5*   < > 9.3   < > 9.9 10.0 10.0  --  9.7  GFRNONAA 41* 46* >60  --  44*   < >  --  >60 55*  --  50*  GFRAA 48* 53* >60  --   --   --   --   --   --   --   --   PROT  --   --   --   --  6.9  --   --   --  7.9  --  7.9  ALBUMIN  --   --   --    < > 3.9  --  4.2  --  4.6  --  4.4  AST  --   --   --   --  24  --   --   --  18  --  17  ALT  --   --   --   --  8  --   --   --  9  --  8  ALKPHOS  --   --   --   --  40  --   --   --  54  --  62  BILITOT  --   --   --   --  0.6  --   --   --  0.7  --  0.5   < > = values in this interval not displayed.     RADIOGRAPHIC STUDIES: I have personally reviewed the radiological images as listed and agreed with the findings in the report. CT CHEST W CONTRAST  Result Date: 05/13/2021 CLINICAL DATA:  History of non-small cell lung cancer, status post SBRT to left upper lobe. EXAM: CT CHEST WITH CONTRAST TECHNIQUE: Multidetector CT imaging of the chest was performed during intravenous contrast administration. CONTRAST:  74mL OMNIPAQUE IOHEXOL 300 MG/ML  SOLN COMPARISON:  Multiple priors including most recent CT November 25, 2020 and PET-CT November 12, 2020. FINDINGS: Cardiovascular: Aortic and branch vessel atherosclerosis without aneurysmal dilation. Three-vessel coronary artery calcifications. No central pulmonary embolus. Calcifications of the aortic annulus. Normal size heart. No significant pericardial effusion/thickening. Mediastinum/Nodes: No  pathologically enlarged mediastinal, hilar or axillary lymph nodes. Hypodense 1 cm nodule in the left lobe of thyroid. Not clinically significant; no follow-up imaging recommended (ref: J Am Coll Radiol. 2015 Feb;12(2): 143-50).The trachea and esophagus are grossly unremarkable. Lungs/Pleura: Decreased size of the spiculated nodule in the peripheral left upper lobe which was previously FDG avid now measuring 1.9 x 1.0 cm on image 32/3 previously 2.6 x 1.7 cm. Unchanged size 3 mm subpleural nodule in the posterior right upper lobe on image 43/3. Similar appearance of the nodular band of scarring in the peripheral right upper lobe on image 41/3. Moderate to severe centrilobular emphysema. No pleural effusion. No pneumothorax. Upper Abdomen: Similar asymmetric right renal atrophy. Similar nodular thickening of the left adrenal gland which was not abnormally hypermetabolic on prior PET-CT. No acute abnormality. Musculoskeletal: No aggressive lytic or blastic lesion of bone. No acute osseous abnormality. IMPRESSION: 1. Decreased size of the spiculated nodule in the peripheral left upper lobe now measuring 1.9 cm. 2. Stable 3 mm subpleural nodule in the posterior right upper lobe. Which while nonspecific is statistically likely to be benign sequela prior infection or inflammation. 3. Moderate to severe centrilobular emphysema. 4. Coronary artery calcifications. Please note that although the presence of coronary artery calcium documents the presence of coronary artery disease, the severity of this disease and any potential stenosis cannot be assessed on this non-gated CT examination. Assessment for potential risk factor modification, dietary therapy or pharmacologic therapy may be warranted, if clinically indicated 5. Aortic Atherosclerosis (ICD10-I70.0) and Emphysema (ICD10-J43.9). Electronically Signed   By: Dahlia Bailiff MD   On: 05/13/2021 11:25    ASSESSMENT & PLAN:   1.  Stage I non-small cell, favoring  adenocarcinoma, cancer of the left upper lobe of lung-declined surgery.  Completed SBRT completed March 2022. Reviewed imaging from 05/13/21 which shows decrease in size of spiculated nodule in LUL now measuring 1.9 cm. She has imaging planned for January 2023 per Dr. Baruch Gouty. Reviewed surveillance guidelines including exam and chest CT +/-contrast every 3-6 months for 3 years then every 6 months for 2 years then LDCT annually.   2. Hypermetabolic right parotid uptake on pet 11/13/20. Incidental. Asymptomatic. Hasn't seen ENT since 2015. Referral was sent at previous visit. Again encouraged her to go be evaluated.  She is agreeable. Will resent referral today.   3. Iron Deficiency Anemia- secondary to chronic GI Bleed/AVMs. Hemoglobin 10.5. Worse but generally stable. Normocytic. Clinically asymptomatic. Iron studies pending at time of visit. Hold iron today. Continue oral iron.    4. Smoking cessation- cutting back. Offered referral to [facility] for smoking cessation or self referral to [facility] with Burgess Estelle. She declines.   Disposition:  RTC in 3 months for labs (cbc, cmp, ferritin, iron studies), MD, possible venofer Send referral to ENT   No problem-specific Assessment & Plan notes found for this encounter.  All questions were answered. The patient knows to call the clinic with any problems, questions or concerns.    Verlon Au, NP 06/10/2021 11:41 AM

## 2021-06-10 NOTE — Telephone Encounter (Signed)
RN called and spoke with Kathryn Ware who stated they had not received a referral on pt on Mar 12, 2021.  Pt has not been seen in office since 2015.  Ware office gave number to fax referral and notes [phone].  RN faxed and received confirmation of transmission. Pt instructed that Ware office would call with appt. Pt verbalized understanding.

## 2021-06-10 NOTE — Progress Notes (Signed)
Pt in for follow up, states "feeling about the same" reports she is taking iron daily and has fair appetite.

## 2021-07-02 ENCOUNTER — Telehealth: Payer: Self-pay | Admitting: Pharmacist

## 2021-07-02 NOTE — Progress Notes (Signed)
    Chronic Care Management Pharmacy Assistant   Name: Kathryn Ware MRN: [id number] DOB: 1940-12-19  Reason for Encounter: Initial Questions Appointment: Televisit 07/09/21 @ 11 am   Recent office visits:  09/01/20 Silvio Pate (PCP) [facility] F/u. Angiodysplasia of small intestine. D/c Metoprolol & Nicotine.  Recent consult visits:  06/10/21 Zenia Resides (Oncology) - Malignant neoplasm of lung. No med changes.  03/11/21 Zenia Resides (Oncology) -  Anemia. No med changes. Referral to ENT.  Hospital visits:  None in previous 6 months  Medications: Outpatient Encounter Medications as of 07/02/2021  Medication Sig   albuterol (VENTOLIN HFA) 108 (90 Base) MCG/ACT inhaler Inhale 2 puffs into the lungs every 6 (six) hours as needed for wheezing or shortness of breath.   clonazePAM (KLONOPIN) 0.5 MG tablet Take 1-2 tablets (0.5-1 mg total) by mouth at bedtime.   ferrous sulfate 325 (65 FE) MG tablet Take 325 mg by mouth daily with breakfast.   glucose blood (ONETOUCH VERIO) test strip Use to check blood sugar once daily E11.69   lisinopril-hydrochlorothiazide (ZESTORETIC) 20-12.5 MG tablet TAKE 2 TABLETS BY MOUTH DAILY (Patient taking differently: Take by mouth daily. Take 1 tablet by  mouth daily)   metFORMIN (GLUCOPHAGE-XR) 500 MG 24 hr tablet TAKE 1 TABLET(500 MG) BY MOUTH DAILY WITH BREAKFAST   omeprazole (PRILOSEC) 40 MG capsule Take 1 capsule (40 mg total) by mouth in the morning and at bedtime.   OneTouch Delica Lancets 00X MISC 1 each by Does not apply route daily. Use to obtain blood sugar sample daily E11.69   simvastatin (ZOCOR) 80 MG tablet TAKE 1 TABLET(80 MG) BY MOUTH DAILY   vitamin B-12 (CYANOCOBALAMIN) 1000 MCG tablet Take 1,000 mcg by mouth daily.   No facility-administered encounter medications on file as of 07/02/2021.    Have you seen any other providers since your last visit?  Patient states she seen her oncologists last month.  Any changes in your medications or health?  Patient states  no recent changes.  Any side effects from any medications?  Patient states no side effects at this time.  Do you have an symptoms or problems not managed by your medications?  Patient states no symptoms or problems at this time.  Any concerns about your health right now?  Patient states no concerns at this time.  Has your provider asked that you check blood pressure, blood sugar, or follow special diet at home? Patient states she checks her BP and glucose sometimes and they are in normal range. Patient states she does not keep a log or follow a special diet.  Do you get any type of exercise on a regular basis? Patient states she does some walking around her house.   Can you think of a goal you would like to reach for your health?  Patient states no goals at this time.  Do you have any problems getting your medications? Patient states no problems getting her medications.  Is there anything that you would like to discuss during the appointment?  Patient states no concerns at this time.  Please bring medications and supplements to appointment.  Star Rating Drugs: lisinopril-hydrochlorothiazide - filled 06/25/21 90D Simvastatin - filled 04/09/21 90D Metformin - filled 05/19/21 [place], RMA Clinical Pharmacists Assistant [phone]  Time Spent: [id number]

## 2021-07-06 DIAGNOSIS — C3490 Malignant neoplasm of unspecified part of unspecified bronchus or lung: Secondary | ICD-10-CM | POA: Diagnosis not present

## 2021-07-06 DIAGNOSIS — D37030 Neoplasm of uncertain behavior of the parotid salivary glands: Secondary | ICD-10-CM | POA: Diagnosis not present

## 2021-07-08 ENCOUNTER — Other Ambulatory Visit: Payer: Self-pay | Admitting: Unknown Physician Specialty

## 2021-07-08 DIAGNOSIS — K119 Disease of salivary gland, unspecified: Secondary | ICD-10-CM

## 2021-07-09 ENCOUNTER — Other Ambulatory Visit: Payer: Self-pay

## 2021-07-09 ENCOUNTER — Ambulatory Visit (INDEPENDENT_AMBULATORY_CARE_PROVIDER_SITE_OTHER): Payer: Medicare Other | Admitting: Pharmacist

## 2021-07-09 DIAGNOSIS — I5032 Chronic diastolic (congestive) heart failure: Secondary | ICD-10-CM

## 2021-07-09 DIAGNOSIS — E1169 Type 2 diabetes mellitus with other specified complication: Secondary | ICD-10-CM

## 2021-07-09 DIAGNOSIS — E7800 Pure hypercholesterolemia, unspecified: Secondary | ICD-10-CM

## 2021-07-09 DIAGNOSIS — I700 Atherosclerosis of aorta: Secondary | ICD-10-CM

## 2021-07-09 DIAGNOSIS — I10 Essential (primary) hypertension: Secondary | ICD-10-CM

## 2021-07-09 DIAGNOSIS — J410 Simple chronic bronchitis: Secondary | ICD-10-CM

## 2021-07-09 DIAGNOSIS — E785 Hyperlipidemia, unspecified: Secondary | ICD-10-CM

## 2021-07-09 DIAGNOSIS — Z87891 Personal history of nicotine dependence: Secondary | ICD-10-CM

## 2021-07-09 NOTE — Progress Notes (Signed)
Chronic Care Management Pharmacy Note  07/12/2021 Name:  Kathryn Ware MRN:  [id number] DOB:  03-20-1941  Summary: -Pt reports she quit smoking at the end of August 2022 -Home BP 133/66, fasting BG 107. She endorses compliance with medications and denies issues  Recommendations/Changes made from today's visit: -Advised to get Covid booster, flu vaccine and Shingrix at local pharmacy -Counseled to use lowest effective dose of clonazepam for RLS   Subjective: Kathryn Ware is an 80 y.o. year old female who is a primary patient of Venia Carbon, MD.  The CCM team was consulted for assistance with disease management and care coordination needs.    Engaged with patient by telephone for initial visit in response to provider referral for pharmacy case management and/or care coordination services.   Consent to Services:  The patient was given the following information about Chronic Care Management services today, agreed to services, and gave verbal consent: 1. CCM service includes personalized support from designated clinical staff supervised by the primary care provider, including individualized plan of care and coordination with other care providers 2. 24/7 contact phone numbers for assistance for urgent and routine care needs. 3. Service will only be billed when office clinical staff spend 20 minutes or more in a month to coordinate care. 4. Only one practitioner may furnish and bill the service in a calendar month. 5.The patient may stop CCM services at any time (effective at the end of the month) by phone call to the office staff. 6. The patient will be responsible for cost sharing (co-pay) of up to 20% of the service fee (after annual deductible is met). Patient agreed to services and consent obtained.  Patient Care Team: Venia Carbon, MD as PCP - General Telford Nab, RN as Oncology Nurse Navigator [place], [place], [facility] as Pharmacist (Pharmacist)  Patient is from [place]. She  lives with her sister Kathryn Ware.   Recent office visits: 09/01/20 Kathryn Ware (PCP) [facility] F/u (GI bleed); received 2 units PRBC and iron infusion in hospital. D/c Metoprolol & Nicotine. Restart lisinopril/HCTZ 1 daily. Resume metformin.  Recent consult visits: 06/10/21 Kathryn Ware (Oncology) - Malignant neoplasm of lung. No med changes. -Initial dx 11/2020, Stage 1. S/p radiation 12/2020. Ordered iron studies. Pt declined smoking cessation referrals. Reordered referral to ENT.   03/11/21 Kathryn Ware (Oncology) -  Anemia. No med changes. Referral to ENT.  Hospital visits: None in previous 6 months   Objective:  Lab Results  Component Value Date   CREATININE 1.12 (H) 06/10/2021   BUN 19 06/10/2021   GFR 56.65 (L) 09/01/2020   GFRNONAA 50 (L) 06/10/2021   GFRAA >60 06/26/2020   NA 138 06/10/2021   K 4.3 06/10/2021   CALCIUM 9.7 06/10/2021   CO2 26 06/10/2021   GLUCOSE 106 (H) 06/10/2021    Lab Results  Component Value Date/Time   HGBA1C 5.9 (H) 08/22/2020 03:06 AM   HGBA1C 5.0 01/06/2020 07:10 AM   HGBA1C 5.8 01/06/2014 12:39 AM   GFR 56.65 (L) 09/01/2020 11:46 AM   GFR 67.14 06/30/2020 12:54 PM   MICROALBUR 0.3 08/17/2011 10:35 AM   MICROALBUR 0.7 12/16/2009 08:29 AM    Last diabetic Eye exam:  Lab Results  Component Value Date/Time   HMDIABEYEEXA No Retinopathy 04/27/2021 12:00 AM    Last diabetic Foot exam:  Lab Results  Component Value Date/Time   HMDIABFOOTEX done 09/12/2019 12:00 AM     Lab Results  Component Value Date   CHOL 98 01/06/2020  HDL 27 (L) 01/06/2020   LDLCALC 50 01/06/2020   TRIG 107 01/06/2020   CHOLHDL 3.6 01/06/2020    Hepatic Function Latest Ref Rng & Units 06/10/2021 03/11/2021 09/01/2020  Total Protein 6.5 - 8.1 g/dL 7.9 7.9 -  Albumin 3.5 - 5.0 g/dL 4.4 4.6 4.2  AST 15 - 41 U/L 17 18 -  ALT 0 - 44 U/L 8 9 -  Alk Phosphatase 38 - 126 U/L 62 54 -  Total Bilirubin 0.3 - 1.2 mg/dL 0.5 0.7 -  Bilirubin, Direct 0.0 - 0.3 mg/dL - - -    Lab Results   Component Value Date/Time   TSH 1.456 03/11/2015 04:22 AM   TSH 0.918 01/06/2014 12:39 AM   TSH 0.91 10/29/2013 10:45 AM   FREET4 0.81 09/12/2019 03:49 PM   FREET4 0.66 12/19/2017 11:14 AM    CBC Latest Ref Rng & Units 06/10/2021 03/11/2021 11/25/2020  WBC 4.0 - 10.5 K/uL 3.5(L) 3.7(L) 4.3  Hemoglobin 12.0 - 15.0 g/dL 10.5(L) 11.1(L) 11.2(L)  Hematocrit 36.0 - 46.0 % 34.0(L) 34.5(L) 35.9(L)  Platelets 150 - 400 K/uL 251 311 243    No results found for: VD25OH  Clinical ASCVD: Yes  - Aortic atherosclerosis The ASCVD Risk score (Arnett DK, et al., 2019) failed to calculate for the following reasons:   The valid total cholesterol range is 130 to 320 mg/dL    Depression screen [facility] 2/9 09/12/2019 09/05/2018 03/30/2017  Decreased Interest 0 0 0  Down, Depressed, Hopeless 1 0 1  PHQ - 2 Score 1 0 1  Some recent data might be hidden     Social History   Tobacco Use  Smoking Status Former   Packs/day: 1.00   Years: 63.00   Pack years: 63.00   Types: Cigarettes   Quit date: 06/24/2021   Years since quitting: 0.0  Smokeless Tobacco Never  Tobacco Comments   Pt reports she quit smoking end of summer 2022   BP Readings from Last 3 Encounters:  06/10/21 (!) 159/69  05/17/21 (!) 155/66  03/11/21 (!) 146/50   Pulse Readings from Last 3 Encounters:  06/10/21 73  05/17/21 81  03/11/21 77   Wt Readings from Last 3 Encounters:  06/10/21 95 lb (43.1 kg)  05/17/21 94 lb 8 oz (42.9 kg)  03/11/21 93 lb 3.2 oz (42.3 kg)   BMI Readings from Last 3 Encounters:  06/10/21 15.33 kg/m  05/17/21 15.25 kg/m  03/11/21 15.04 kg/m    Assessment/Interventions: Review of patient past medical history, allergies, medications, health status, including review of consultants reports, laboratory and other test data, was performed as part of comprehensive evaluation and provision of chronic care management services.   SDOH:  (Social Determinants of Health) assessments and interventions performed:  Yes  SDOH Screenings   Alcohol Screen: Not on file  Depression (PHQ2-9): Not on file  Financial Resource Strain: Not on file  Food Insecurity: Not on file  Housing: Not on file  Physical Activity: Not on file  Social Connections: Not on file  Stress: Not on file  Tobacco Use: Medium Risk   Smoking Tobacco Use: Former   Smokeless Tobacco Use: Never  Transportation Needs: Not on file    CCM Care Plan  Allergies  Allergen Reactions   Nifedipine Cough   Metformin Diarrhea    REACTION: diarrhea when dosage is increased, ok when taking one tablet daily, patient aware that she is on metformin when it is listed that she is allergic    Medications  Reviewed Today     Reviewed by Kathryn Ware, [facility] (Pharmacist) on 07/12/21 at [place] List Status: <None>   Medication Order Taking? Sig Documenting Provider Last Dose Status Informant  albuterol (VENTOLIN HFA) 108 (90 Base) MCG/ACT inhaler [id number] Yes Inhale 2 puffs into the lungs every 6 (six) hours as needed for wheezing or shortness of breath. Venia Carbon, MD Taking Active   clonazePAM Bobbye [place]) 0.5 MG tablet [id number] Yes Take 1-2 tablets (0.5-1 mg total) by mouth at bedtime. Venia Carbon, MD Taking Active   ferrous sulfate 325 (65 FE) MG tablet [id number] Yes Take 325 mg by mouth daily with breakfast. [provider] Taking Active Self  glucose blood (ONETOUCH VERIO) test strip [id number]  Use to check blood sugar once daily E11.69 Venia Carbon, MD  Active   lisinopril-hydrochlorothiazide (ZESTORETIC) 20-12.5 MG tablet [id number] Yes TAKE 2 TABLETS BY MOUTH DAILY  Patient taking differently: Take by mouth daily. Take 1 tablet by  mouth daily   Venia Carbon, MD Taking Active   metFORMIN (GLUCOPHAGE-XR) 500 MG 24 hr tablet [id number] Yes TAKE 1 TABLET(500 MG) BY MOUTH DAILY WITH BREAKFAST Venia Carbon, MD Taking Active   omeprazole (PRILOSEC) 40 MG capsule [id number] Yes Take 1 capsule (40 mg  total) by mouth in the morning and at bedtime. Venia Carbon, MD Taking Active   OneTouch Delica Lancets 53Z MISC [id number] Yes 1 each by Does not apply route daily. Use to obtain blood sugar sample daily E11.69 Venia Carbon, MD Taking Active   simvastatin (ZOCOR) 80 MG tablet [id number] Yes TAKE 1 TABLET(80 MG) BY MOUTH DAILY Venia Carbon, MD Taking Active   vitamin B-12 (CYANOCOBALAMIN) 1000 MCG tablet [id number] Yes Take 1,000 mcg by mouth daily. [provider] Taking Active Self            Patient Active Problem List   Diagnosis Date Noted   Iron deficiency anemia 06/10/2021   Parotid nodule 06/10/2021   Malignant neoplasm of lung ([place]) 06/10/2021   Primary cancer of left upper lobe of lung ([place]) 12/10/2020   Tachycardia    GI bleed 08/22/2020   Chronic blood loss anemia 08/22/2020   Angiodysplasia of stomach and duodenum with hemorrhage    Gastritis with hemorrhage    Weakness    GI bleeding 06/24/2020   Symptomatic anemia 01/05/2020   HLD (hyperlipidemia) 01/05/2020   GERD (gastroesophageal reflux disease) 01/05/2020   Anxiety 01/05/2020   Chronic renal disease, stage III ([place]) 01/05/2020   Personal history of tobacco use, presenting hazards to health 01/05/2020   Syncope 01/05/2020   GIB (gastrointestinal bleeding) 01/05/2020   Peripheral arterial disease ([place]) 09/12/2019   Chronic bronchitis ([place]) 12/20/2018   Aortic atherosclerosis ([place]) 12/19/2017   Chronic diastolic CHF (congestive heart failure) ([place]) 05/31/2017   Malnutrition of mild degree ([place]) 03/30/2017   History of adenomatous polyp of colon 09/19/2016   Anemia due to chronic blood loss 07/05/2016   Restless legs syndrome (RLS) 02/23/2016   Duodenal arteriovenous malformation 03/19/2015   AKI (acute kidney injury) ([place]) 03/19/2015   Congenital gastrointestinal vessel anomaly 03/19/2015   Acute on chronic blood loss anemia 02/18/2015   Advance directive discussed with patient  02/18/2015   Nicotine dependence 10/29/2013   Routine general medical examination at a health care facility 08/17/2011   Mild cognitive impairment 08/17/2011   Type 2 diabetes mellitus with hyperlipidemia ([place]) 02/02/2007   HYPERCHOLESTEROLEMIA 02/02/2007   Essential hypertension 02/02/2007  Angiodysplasia of small intestine ([place]) 02/02/2007   Benign essential hypertension 02/02/2007    Immunization History  Administered Date(s) Administered   Influenza Inj Mdck Quad Pf 08/02/2019   Influenza Split 08/17/2011, 08/02/2013, 07/24/2014   Influenza Whole 08/13/2009, 08/11/2010   Influenza, High Dose Seasonal PF 08/10/2015   Influenza,inj,Quad PF,6+ Mos 07/20/2016   Influenza-Unspecified 07/09/2014, 08/11/2017, 07/24/2018, 07/25/2020   PFIZER(Purple Top)SARS-COV-2 Vaccination 12/13/2019, 01/03/2020, 07/23/2020   Pneumococcal Conjugate-13 02/18/2015   Pneumococcal Polysaccharide-23 01/24/2008, 05/31/2017   Td 05/17/2006    Conditions to be addressed/monitored:  Hypertension, Hyperlipidemia, Diabetes, Heart Failure, Coronary Artery Disease, GERD, and COPD  Care Plan : [place]  Updates made by Kathryn Ware, [place] since 07/12/2021 12:00 AM     Problem: Hypertension, Hyperlipidemia, Diabetes, Heart Failure, Coronary Artery Disease, GERD, and COPD   Priority: High     Long-Range Goal: Disease management   Start Date: 07/12/2021  Expected End Date: 07/12/2022  This Visit's Progress: On track  Priority: High  Note:   Current Barriers:  Unable to independently monitor therapeutic efficacy  Pharmacist Clinical Goal(s):  Patient will achieve adherence to monitoring guidelines and medication adherence to achieve therapeutic efficacy through collaboration with PharmD and provider.   Interventions: 1:1 collaboration with Venia Carbon, MD regarding development and update of comprehensive plan of care as evidenced by provider attestation and  co-signature Inter-disciplinary care team collaboration (see longitudinal plan of care) Comprehensive medication review performed; medication list updated in electronic medical record  Hyperlipidemia: (LDL goal < 70) -Controlled - LDL is at goal; pt endorses compliance with statin; no aspirin due to multiple GI bleeding events -hx aortic atherosclerosis, PAD -Current treatment: Simvastatin 80 mg daily -Educated on Cholesterol goals;  Benefits of statin for ASCVD risk reduction; -she is on high dose simvastatin which is no longer recommended; however given lack of side effects it is reasonable to continue  -Recommended to continue current medication  Diabetes (A1c goal <7%) -Controlled - A1c is at goal in pre-diabetic range; pt endorses compliance with metformin; she checks BG a few times a week in AM -Current medications: Metformin ER 500 mg daily Testing supplies -Current home glucose readings fasting glucose: 107 -Denies hypoglycemic/hyperglycemic symptoms -Educated on A1c and blood sugar goals; -Pt may be able to d/c metformin since she is on lowest dose and A1c is < 6%; ok to continue for now -Recommended to continue current medication  Heart Failure / Hypertension (Goal: BP < 140/90) -Controlled - pt reports compliance with medication and home BP is at goal; she is taking only 1 tablet daily of lisinopril-hct (previously recommended by PCP) -Current home BP/HR readings: 133/66 -Last ejection fraction: >75% (Date: 12/2019) -HF type: Diastolic (Grade 1 dysfunction) -Current treatment: Lisinopril-HCTZ 20-12.5 mg -1 tab daily -Educated on Benefits of medications for managing symptoms and prolonging life; Importance of blood pressure control -Recommended to continue current medication - update prescription to reflect 1 tablet daily dosing  Chronic bronchitis / lung cancer (Goal: control symptoms and prevent exacerbations) -Controlled - pt reports she does not use albuterol often;  she denies significant issues with SOB or wheezing -Gold Grade: Gold 2 (FEV1 50-79%) -Current COPD Classification:  A (low sx, <2 exacerbations/yr) -MMRC/CAT score: not on file -Pulmonary function testing: FEV1 78% predicted, FEV1/FVC 0.68 -Exacerbations requiring treatment in last 6 months: 0 -Current treatment  Albuterol HFA prn -Frequency of rescue inhaler use: rare -Counseled on Proper inhaler technique; -Recommended to continue current medication  Tobacco use (Goal: cessation) -Controlled -  pt reports she quit smoking 3 weeks ago -Congratulated pt on smoking cessation  RLS (Goal: manage symptoms) -Controlled - pt reports clonazepam is helpful for RLS -Current treatment: Clonazepam 0.5 mg 1-2 tab HS prn - takes 2 for RLS -Medications previously tried/failed: n/a -Clonazepam is not the drug of choice for RLS but pt is using it appropriately and denies issues -Counseled on risk of sedation, dependence; advised to use lowest dose possible to control symptoms -Recommended to continue current medication  GERD / GI Bleeding (Goal: manage symptoms) -Controlled - pt endorses compliance with medications; she was not aware omeprazole is helpful for GI bleed prophylaxis -chronic GI bleeds. Occasional iron infusions (last venofer 11/2020) -Current treatment  Omeprazole 40 mg BID Ferrous sulfate 325 mg daily AM -Recommended to continue current medication  Health Maintenance -Vaccine gaps: TDAP, covid booster, shingrix, flu -Current therapy:  Vitamin B12 1000 mcg daily -Patient is satisfied with current therapy and denies issues -Recommended to get vaccines at local pharmacy  Patient Goals/Self-Care Activities Patient will:  - take medications as prescribed -focus on medication adherence by pill box -check glucose 3x a week, -check blood pressure periodically -Get flu, covid booster, shingrix vaccines at local pharmacy      Medication Assistance: None required.  Patient affirms  current coverage meets needs.  Compliance/Adherence/Medication fill history: Care Gaps: Hep C screening DEXA scan Foot exam (09/11/20) A1c due (02/20/21)  Star-Rating Drugs: Lisinopril-HCTZ - LF 06/25/21 x 90 DS Simvastatin - LF 04/09/21 x 90 ds Metformin - LF 05/19/21 x 90 ds  Patient's preferred pharmacy is:  [id number] [address], [place] AT [facility] OF [address] [place] [address] Phone: [phone] Fax: [phone]  Uses pill box? Yes Pt endorses 100% compliance  We discussed: Benefits of medication synchronization, packaging and delivery as well as enhanced pharmacist oversight with [employer]. Patient decided to: Continue current medication management strategy  Care Plan and Follow Up Patient Decision:  Patient agrees to Care Plan and Follow-up.  Plan: Telephone follow up appointment with care management team member scheduled for:  6 months  Charlene Brooke, PharmD, [place], CPP Clinical Pharmacist [facility] [phone]

## 2021-07-12 ENCOUNTER — Other Ambulatory Visit: Payer: Self-pay | Admitting: Internal Medicine

## 2021-07-12 ENCOUNTER — Encounter: Payer: Self-pay | Admitting: Pharmacist

## 2021-07-12 MED ORDER — CLONAZEPAM 0.5 MG PO TABS: 0.5000 mg | ORAL_TABLET | Freq: Every day | ORAL | 0 refills | Status: DC

## 2021-07-12 NOTE — Patient Instructions (Signed)
Visit Information  Phone number for Pharmacist: [phone]   Goals Addressed             This Visit's Progress    Manage My Medicine       Timeframe:  Long-Range Goal Priority:  High Start Date:        07/09/21                     Expected End Date:    07/09/22                   Follow Up Date March 2023   - call for medicine refill 2 or 3 days before it runs out - call if I am sick and can't take my medicine - keep a list of all the medicines I take; vitamins and herbals too - use a pillbox to sort medicine    Why is this important?   These steps will help you keep on track with your medicines.   Notes:         Care Plan : [place]  Updates made by Charlton Haws, RPH since 07/12/2021 12:00 AM     Problem: Hypertension, Hyperlipidemia, Diabetes, Heart Failure, Coronary Artery Disease, GERD, and COPD   Priority: High     Long-Range Goal: Disease management   Start Date: 07/12/2021  Expected End Date: 07/12/2022  This Visit's Progress: On track  Priority: High  Note:   Current Barriers:  Unable to independently monitor therapeutic efficacy  Pharmacist Clinical Goal(s):  Patient will achieve adherence to monitoring guidelines and medication adherence to achieve therapeutic efficacy through collaboration with PharmD and provider.   Interventions: 1:1 collaboration with Venia Carbon, MD regarding development and update of comprehensive plan of care as evidenced by provider attestation and co-signature Inter-disciplinary care team collaboration (see longitudinal plan of care) Comprehensive medication review performed; medication list updated in electronic medical record  Hyperlipidemia: (LDL goal < 70) -Controlled - LDL is at goal; pt endorses compliance with statin; no aspirin due to multiple GI bleeding events -hx aortic atherosclerosis, PAD -Current treatment: Simvastatin 80 mg daily -Educated on Cholesterol goals;  Benefits of  statin for ASCVD risk reduction; -she is on high dose simvastatin which is no longer recommended; however given lack of side effects it is reasonable to continue  -Recommended to continue current medication  Diabetes (A1c goal <7%) -Controlled - A1c is at goal in pre-diabetic range; pt endorses compliance with metformin; she checks BG a few times a week in AM -Current medications: Metformin ER 500 mg daily Testing supplies -Current home glucose readings fasting glucose: 107 -Denies hypoglycemic/hyperglycemic symptoms -Educated on A1c and blood sugar goals; -Pt may be able to d/c metformin since she is on lowest dose and A1c is < 6%; ok to continue for now -Recommended to continue current medication  Heart Failure / Hypertension (Goal: BP < 140/90) -Controlled - pt reports compliance with medication and home BP is at goal; she is taking only 1 tablet daily of lisinopril-hct (previously recommended by PCP) -Current home BP/HR readings: 133/66 -Last ejection fraction: >75% (Date: 12/2019) -HF type: Diastolic (Grade 1 dysfunction) -Current treatment: Lisinopril-HCTZ 20-12.5 mg -1 tab daily -Educated on Benefits of medications for managing symptoms and prolonging life; Importance of blood pressure control -Recommended to continue current medication - update prescription to reflect 1 tablet daily dosing  Chronic bronchitis / lung cancer (Goal: control symptoms and prevent exacerbations) -Controlled - pt reports she  does not use albuterol often; she denies significant issues with SOB or wheezing -Gold Grade: Gold 2 (FEV1 50-79%) -Current COPD Classification:  A (low sx, <2 exacerbations/yr) -MMRC/CAT score: not on file -Pulmonary function testing: FEV1 78% predicted, FEV1/FVC 0.68 -Exacerbations requiring treatment in last 6 months: 0 -Current treatment  Albuterol HFA prn -Frequency of rescue inhaler use: rare -Counseled on Proper inhaler technique; -Recommended to continue current  medication  Tobacco use (Goal: cessation) -Controlled - pt reports she quit smoking 3 weeks ago -Congratulated pt on smoking cessation  RLS (Goal: manage symptoms) -Controlled - pt reports clonazepam is helpful for RLS -Current treatment: Clonazepam 0.5 mg 1-2 tab HS prn - takes 2 for RLS -Medications previously tried/failed: n/a -Clonazepam is not the drug of choice for RLS but pt is using it appropriately and denies issues -Counseled on risk of sedation, dependence; advised to use lowest dose possible to control symptoms -Recommended to continue current medication  GERD / GI Bleeding (Goal: manage symptoms) -Controlled - pt endorses compliance with medications; she was not aware omeprazole is helpful for GI bleed prophylaxis -chronic GI bleeds. Occasional iron infusions (last venofer 11/2020) -Current treatment  Omeprazole 40 mg BID Ferrous sulfate 325 mg daily AM -Recommended to continue current medication  Health Maintenance -Vaccine gaps: TDAP, covid booster, shingrix, flu -Current therapy:  Vitamin B12 1000 mcg daily -Patient is satisfied with current therapy and denies issues -Recommended to get vaccines at local pharmacy  Patient Goals/Self-Care Activities Patient will:  - take medications as prescribed -focus on medication adherence by pill box -check glucose 3x a week, -check blood pressure periodically -Get flu, covid booster, shingrix vaccines at local pharmacy      The patient verbalized understanding of instructions, educational materials, and care plan provided today and declined offer to receive copy of patient instructions, educational materials, and care plan.  Telephone follow up appointment with pharmacy team member scheduled for: 6 months  Charlene Brooke, PharmD, [place], CPP Clinical Pharmacist [facility] [phone]

## 2021-07-12 NOTE — Telephone Encounter (Signed)
Last filled 06-09-21 #60 Last OV 09-01-20 No Future OV [facility]

## 2021-07-12 NOTE — Telephone Encounter (Signed)
*  PT wants to know if this can be expedited as she is completely out. I did advise her that it normally takes 48-72 hours for prescription refills  Encourage patient to contact the pharmacy for refills or they can request refills through [place]:  Please schedule appointment if longer than 1 year  NEXT APPOINTMENT DATE:  MEDICATION: Clonazepam  Is the patient out of medication? Yes  PHARMACY: [facility]  Let patient know to contact pharmacy at the end of the day to make sure medication is ready.  Please notify patient to allow 48-72 hours to process  CLINICAL FILLS OUT ALL BELOW:   LAST REFILL:  QTY:  REFILL DATE:    OTHER COMMENTS:    Okay for refill?  Please advise

## 2021-07-13 ENCOUNTER — Other Ambulatory Visit: Payer: Self-pay | Admitting: Radiology

## 2021-07-14 ENCOUNTER — Ambulatory Visit: Admission: RE | Admit: 2021-07-14 | Payer: Medicare Other | Source: Ambulatory Visit

## 2021-07-14 ENCOUNTER — Ambulatory Visit: Payer: Medicare Other | Admitting: Pulmonary Disease

## 2021-07-23 DIAGNOSIS — I10 Essential (primary) hypertension: Secondary | ICD-10-CM

## 2021-07-23 DIAGNOSIS — J410 Simple chronic bronchitis: Secondary | ICD-10-CM | POA: Diagnosis not present

## 2021-07-23 DIAGNOSIS — I5032 Chronic diastolic (congestive) heart failure: Secondary | ICD-10-CM

## 2021-07-23 DIAGNOSIS — E7800 Pure hypercholesterolemia, unspecified: Secondary | ICD-10-CM | POA: Diagnosis not present

## 2021-07-23 DIAGNOSIS — E1169 Type 2 diabetes mellitus with other specified complication: Secondary | ICD-10-CM | POA: Diagnosis not present

## 2021-07-23 DIAGNOSIS — E785 Hyperlipidemia, unspecified: Secondary | ICD-10-CM

## 2021-08-10 ENCOUNTER — Other Ambulatory Visit: Payer: Self-pay | Admitting: Internal Medicine

## 2021-08-10 MED ORDER — CLONAZEPAM 0.5 MG PO TABS: 0.5000 mg | ORAL_TABLET | Freq: Every day | ORAL | 0 refills | Status: DC

## 2021-08-10 NOTE — Telephone Encounter (Signed)
Pt needs a refill on clonazePAM (KLONOPIN) 0.5 MG tablet sent to [facility]

## 2021-08-10 NOTE — Telephone Encounter (Signed)
Last filled 07-12-21 #60 Last OV 09-01-20 No Future OV [facility]

## 2021-08-16 ENCOUNTER — Telehealth: Payer: Self-pay | Admitting: Internal Medicine

## 2021-08-16 MED ORDER — METFORMIN HCL ER 500 MG PO TB24: ORAL_TABLET | ORAL | 0 refills | Status: AC

## 2021-08-16 NOTE — Telephone Encounter (Signed)
Called patient and left vm to schedule apt

## 2021-08-16 NOTE — Telephone Encounter (Signed)
Pt called she needs a refill on metFORMIN (GLUCOPHAGE-XR) 500 MG 24 hr tablet sent to [facility] in [place]

## 2021-08-16 NOTE — Telephone Encounter (Signed)
I sent in a refill for 90 days. She is past due for an OV/AWV with Dr Silvio Pate. Please schedule.

## 2021-09-02 ENCOUNTER — Other Ambulatory Visit: Payer: Self-pay | Admitting: *Deleted

## 2021-09-02 DIAGNOSIS — C3490 Malignant neoplasm of unspecified part of unspecified bronchus or lung: Secondary | ICD-10-CM

## 2021-09-09 ENCOUNTER — Inpatient Hospital Stay (HOSPITAL_BASED_OUTPATIENT_CLINIC_OR_DEPARTMENT_OTHER): Payer: Medicare Other | Admitting: Nurse Practitioner

## 2021-09-09 ENCOUNTER — Other Ambulatory Visit: Payer: Self-pay

## 2021-09-09 ENCOUNTER — Encounter: Payer: Self-pay | Admitting: Nurse Practitioner

## 2021-09-09 ENCOUNTER — Inpatient Hospital Stay: Payer: Medicare Other

## 2021-09-09 ENCOUNTER — Inpatient Hospital Stay: Payer: Medicare Other | Attending: Nurse Practitioner

## 2021-09-09 VITALS — BP 144/62 | HR 106 | Temp 96.2°F | Resp 16 | Wt 87.0 lb

## 2021-09-09 VITALS — BP 142/54 | HR 93 | Resp 20

## 2021-09-09 DIAGNOSIS — R634 Abnormal weight loss: Secondary | ICD-10-CM | POA: Diagnosis not present

## 2021-09-09 DIAGNOSIS — D509 Iron deficiency anemia, unspecified: Secondary | ICD-10-CM | POA: Diagnosis not present

## 2021-09-09 DIAGNOSIS — D508 Other iron deficiency anemias: Secondary | ICD-10-CM | POA: Diagnosis not present

## 2021-09-09 DIAGNOSIS — Z85118 Personal history of other malignant neoplasm of bronchus and lung: Secondary | ICD-10-CM | POA: Insufficient documentation

## 2021-09-09 DIAGNOSIS — D62 Acute posthemorrhagic anemia: Secondary | ICD-10-CM

## 2021-09-09 DIAGNOSIS — K118 Other diseases of salivary glands: Secondary | ICD-10-CM | POA: Diagnosis not present

## 2021-09-09 DIAGNOSIS — F17210 Nicotine dependence, cigarettes, uncomplicated: Secondary | ICD-10-CM | POA: Insufficient documentation

## 2021-09-09 DIAGNOSIS — R590 Localized enlarged lymph nodes: Secondary | ICD-10-CM | POA: Diagnosis not present

## 2021-09-09 DIAGNOSIS — Z923 Personal history of irradiation: Secondary | ICD-10-CM | POA: Insufficient documentation

## 2021-09-09 DIAGNOSIS — C3412 Malignant neoplasm of upper lobe, left bronchus or lung: Secondary | ICD-10-CM | POA: Diagnosis not present

## 2021-09-09 DIAGNOSIS — C3490 Malignant neoplasm of unspecified part of unspecified bronchus or lung: Secondary | ICD-10-CM

## 2021-09-09 LAB — COMPREHENSIVE METABOLIC PANEL
ALT: 16 U/L (ref 0–44)
AST: 21 U/L (ref 15–41)
Albumin: 3.8 g/dL (ref 3.5–5.0)
Alkaline Phosphatase: 50 U/L (ref 38–126)
Anion gap: 10 (ref 5–15)
BUN: 29 mg/dL — ABNORMAL HIGH (ref 8–23)
CO2: 25 mmol/L (ref 22–32)
Calcium: 9.7 mg/dL (ref 8.9–10.3)
Chloride: 106 mmol/L (ref 98–111)
Creatinine, Ser: 0.85 mg/dL (ref 0.44–1.00)
GFR, Estimated: >60 mL/min (ref >60)
Glucose, Bld: 125 mg/dL — ABNORMAL HIGH (ref 70–99)
Potassium: 4.3 mmol/L (ref 3.5–5.1)
Sodium: 141 mmol/L (ref 135–145)
Total Bilirubin: 0.3 mg/dL (ref 0.3–1.2)
Total Protein: 7.3 g/dL (ref 6.5–8.1)

## 2021-09-09 LAB — CBC WITH DIFFERENTIAL/PLATELET
Abs Immature Granulocytes: 0.03 10*3/uL (ref 0.00–0.07)
Basophils Absolute: 0 10*3/uL (ref 0.0–0.1)
Basophils Relative: 0 %
Eosinophils Absolute: 0 10*3/uL (ref 0.0–0.5)
Eosinophils Relative: 1 %
HCT: 29.2 % — ABNORMAL LOW (ref 36.0–46.0)
Hemoglobin: 8.9 g/dL — ABNORMAL LOW (ref 12.0–15.0)
Immature Granulocytes: 1 %
Lymphocytes Relative: 13 %
Lymphs Abs: 0.7 10*3/uL (ref 0.7–4.0)
MCH: 25.4 pg — ABNORMAL LOW (ref 26.0–34.0)
MCHC: 30.5 g/dL (ref 30.0–36.0)
MCV: 83.4 fL (ref 80.0–100.0)
Monocytes Absolute: 0.7 10*3/uL (ref 0.1–1.0)
Monocytes Relative: 13 %
Neutro Abs: 4 10*3/uL (ref 1.7–7.7)
Neutrophils Relative %: 72 %
Platelets: 250 10*3/uL (ref 150–400)
RBC: 3.5 MIL/uL — ABNORMAL LOW (ref 3.87–5.11)
RDW: 12.6 % (ref 11.5–15.5)
WBC: 5.6 10*3/uL (ref 4.0–10.5)
nRBC: 0 % (ref 0.0–0.2)

## 2021-09-09 LAB — IRON AND TIBC
Iron: 16 ug/dL — ABNORMAL LOW (ref 28–170)
Saturation Ratios: 5 % — ABNORMAL LOW (ref 10.4–31.8)
TIBC: 346 ug/dL (ref 250–450)
UIBC: 330 ug/dL

## 2021-09-09 LAB — FERRITIN: Ferritin: 25 ng/mL (ref 11–307)

## 2021-09-09 MED ORDER — IRON SUCROSE 20 MG/ML IV SOLN
200.0000 mg | Freq: Once | INTRAVENOUS | Status: AC
  Administered 2021-09-09: 14:00:00 200 mg via INTRAVENOUS
  Filled 2021-09-09: qty 10

## 2021-09-09 MED ORDER — SODIUM CHLORIDE 0.9 % IV SOLN
Freq: Once | INTRAVENOUS | Status: AC
  Filled 2021-09-09: qty 250

## 2021-09-09 MED ORDER — CLONAZEPAM 0.5 MG PO TABS: 0.5000 mg | ORAL_TABLET | Freq: Every day | ORAL | 0 refills | Status: DC

## 2021-09-09 MED ORDER — SODIUM CHLORIDE 0.9 % IV SOLN: 200.0000 mg | Freq: Once | INTRAVENOUS | Status: DC

## 2021-09-09 NOTE — Progress Notes (Signed)
[name] NOTE  Patient Care Team: Venia Carbon, MD as PCP - General Telford Nab, RN as Oncology Nurse Navigator Charlton Haws, [facility] as Pharmacist (Pharmacist)  CHIEF COMPLAINTS/PURPOSE OF CONSULTATION: lung cancer  Oncology History Overview Note  1. Unchanged spiculated mass of the peripheral left upper lobe measuring 2.6 x 1.7 cm, previously FDG avid. Findings remain consistent with primary lung malignancy. 2. Unchanged 3 mm subpleural nodule of the posterior right upper lobe, nonspecific, although statistically likely to be benign sequelae of prior infection or inflammation. 3. Moderate to severe centrilobular emphysema. 4. Coronary artery disease.  LUNG, LEFT UPPER LOBE; ENB-ASSISTED BIOPSY:  - NON-SMALL CELL CARCINOMA, FAVOR ADENOCARCINOMA.   # FEB 2022- Stage IA- adeno ca; LUL [s/p Bronc; Dr.G]; DECLINES SURGERY.   There is sufficient material for limited ancillary molecular studies.   # # SURVIVORSHIP:   # GENETICS:   DIAGNOSIS: LUL LUNG CA  STAGE:  I       ;  GOALS: curative/control  CURRENT/MOST RECENT THERAPY :  SBRT   Primary cancer of left upper lobe of lung ([place])  12/10/2020 Initial Diagnosis   Primary cancer of left upper lobe of lung ([place])   12/10/2020 Cancer Staging   Staging form: Lung, AJCC 8th Edition - Clinical stage from 12/10/2020: Stage IA3 (cT1c, cN0, cM0) - Signed by Cammie Sickle, MD on 12/10/2020 Histopathologic type: Adenocarcinoma, NOS Stage prefix: Initial diagnosis Histologic grade (G): GX Histologic grading system: 4 grade system     HISTORY OF PRESENTING ILLNESS:  Kathryn Ware 80 y.o.  female who returns to clinic for follow up for stage I NSCLC. She previously declined surgery and received SBRT completed 01/12/21. She has ongoing chronic pain at site of her lung cancer. Chronic fatigue which is stable. No cough, hemoptysis, or other chest pain. Appetite is poor and her weight has dropped.    Review of Systems  Constitutional:  Positive for malaise/fatigue. Negative for chills, diaphoresis, fever and weight loss.  HENT:  Negative for nosebleeds and sore throat.   Eyes:  Negative for double vision.  Respiratory:  Negative for cough, hemoptysis, sputum production, shortness of breath and wheezing.   Cardiovascular:  Negative for chest pain, palpitations, orthopnea and leg swelling.  Gastrointestinal:  Negative for abdominal pain, blood in stool, constipation, diarrhea, heartburn, melena, nausea and vomiting.  Genitourinary:  Negative for dysuria, frequency and urgency.  Musculoskeletal:  Positive for back pain and joint pain.  Skin: Negative.  Negative for itching and rash.  Neurological:  Negative for dizziness, tingling, focal weakness, weakness and headaches.  Endo/Heme/Allergies:  Does not bruise/bleed easily.  Psychiatric/Behavioral:  Negative for depression. The patient is not nervous/anxious and does not have insomnia.     MEDICAL HISTORY:  Past Medical History:  Diagnosis Date   Anemia    Aortic atherosclerosis ([place])    Blood transfusion without reported diagnosis    CAD (coronary artery disease)    CHF (congestive heart failure) (HCC)    CKD (chronic kidney disease), stage III (HCC)    Diabetes mellitus type II    GERD (gastroesophageal reflux disease)    GI (gastrointestinal bleed)    GI AVM (gastrointestinal arteriovenous vascular malformation)    stomach   History of echocardiogram 12/2013   a. 12/2013: EF 60-65%, DD, mild LVH, nl RV size & systolic function, mild MR/TR, mild AS, nl RVSP; b. TTE 04/2017: EF 60-65%, normal wall motion, GR1DD, mild MR, left atrium normal in size, normal  RV systolic function, PASP normal    History of nuclear stress test    a. 12/2013: low risk, no sig WMA, nondiag EKG 2/2 baseline LVH w/ repol abnl, no sig ischemia, EF 70% no artifact   HLD (hyperlipidemia)    HTN (hypertension)    Hypercholesterolemia    IDA (iron deficiency  anemia)    Mild cognitive impairment    PAD (peripheral artery disease) (HCC)    Polyp of colon, adenomatous    RLS (restless legs syndrome)    TIA (transient ischemic attack) 2021    SURGICAL HISTORY: Past Surgical History:  Procedure Laterality Date   ABDOMINAL ADHESION SURGERY  11/2001   Dr. Tamala Julian   ABDOMINAL HYSTERECTOMY  1976   RSO (fibroid)   COLONOSCOPY WITH PROPOFOL N/A 07/07/2016   Procedure: COLONOSCOPY WITH PROPOFOL;  Surgeon: Manya Silvas, MD;  Location: [facility] ENDOSCOPY;  Service: Endoscopy;  Laterality: N/A;   COLONOSCOPY WITH PROPOFOL N/A 12/14/2016   Procedure: COLONOSCOPY WITH PROPOFOL;  Surgeon: Manya Silvas, MD;  Location[facility] ENDOSCOPY;  Service: Endoscopy;  Laterality: N/A;   DOBUTAMINE STRESS ECHO  11/10   normal   DOPPLER ECHOCARDIOGRAPHY     EF 55%, mild MR, TR   ENTEROSCOPY N/A 06/08/2018   Procedure: ENTEROSCOPY;  Surgeon: Lin Landsman, MD;  Location: [place];  Service: Gastroenterology;  Laterality: N/A;   ENTEROSCOPY N/A 01/06/2020   Procedure: ENTEROSCOPY;  Surgeon: Lin Landsman, MD;  Location[facility] ENDOSCOPY;  Service: Gastroenterology;  Laterality: N/A;   ENTEROSCOPY N/A 08/22/2020   Procedure: ENTEROSCOPY;  Surgeon: [name], MD;  Location: [facility] ENDOSCOPY;  Service: Gastroenterology;  Laterality: N/A;   ESOPHAGOGASTRODUODENOSCOPY  07/2006   multiple angioectasias   ESOPHAGOGASTRODUODENOSCOPY Left 03/12/2015   Procedure: place tube in throat and evaluate stomach and duodenum for source of bleeding. Cauterize if concern for rebleeding.;  Surgeon: Hulen Luster, MD;  Location: [facility] ENDOSCOPY;  Service: Endoscopy;  Laterality: Left;   ESOPHAGOGASTRODUODENOSCOPY N/A 12/14/2016   Procedure: ESOPHAGOGASTRODUODENOSCOPY (EGD);  Surgeon: Manya Silvas, MD;  Location: [facility] ENDOSCOPY;  Service: Endoscopy;  Laterality: N/A;   ESOPHAGOGASTRODUODENOSCOPY N/A 07/19/2017   Procedure: ESOPHAGOGASTRODUODENOSCOPY (EGD);  Surgeon: Lin Landsman, MD;  Location: [facility] ENDOSCOPY;  Service: Gastroenterology;  Laterality: N/A;   ESOPHAGOGASTRODUODENOSCOPY (EGD) WITH PROPOFOL N/A 11/23/2015   Procedure: ESOPHAGOGASTRODUODENOSCOPY (EGD) WITH PROPOFOL;  Surgeon: Hulen Luster, MD;  Location: [facility] ENDOSCOPY;  Service: Gastroenterology;  Laterality: N/A;   ESOPHAGOGASTRODUODENOSCOPY (EGD) WITH PROPOFOL N/A 07/07/2016   Procedure: ESOPHAGOGASTRODUODENOSCOPY (EGD) WITH PROPOFOL;  Surgeon: Manya Silvas, MD;  Location: [facility] ENDOSCOPY;  Service: Endoscopy;  Laterality: N/A;   ESOPHAGOGASTRODUODENOSCOPY (EGD) WITH PROPOFOL N/A 05/19/2017   Procedure: ESOPHAGOGASTRODUODENOSCOPY (EGD) WITH PROPOFOL;  Surgeon: Manya Silvas, MD;  Location: [facility] ENDOSCOPY;  Service: Endoscopy;  Laterality: N/A;   ESOPHAGOGASTRODUODENOSCOPY (EGD) WITH PROPOFOL N/A 06/26/2020   Procedure: ESOPHAGOGASTRODUODENOSCOPY (EGD) WITH PROPOFOL;  Surgeon: Lucilla Lame, MD;  Location: [facility] ENDOSCOPY;  Service: Endoscopy;  Laterality: N/A;   laryngeal polyp  10/2008   Dr. Tami Ribas   TOP     VIDEO BRONCHOSCOPY WITH ENDOBRONCHIAL NAVIGATION Left 12/02/2020   Procedure: VIDEO BRONCHOSCOPY WITH ENDOBRONCHIAL NAVIGATION;  Surgeon: Tyler Pita, MD;  Location: [facility] ORS;  Service: Pulmonary;  Laterality: Left;    SOCIAL HISTORY: Social History   Socioeconomic History   Marital status: Widowed    Spouse name: Not on file   Number of children: 1   Years of education: Not on file   Highest education  level: Not on file  Occupational History   Occupation: Regulatory affairs officer    Comment: retired  Tobacco Use   Smoking status: Former    Packs/day: 1.00    Years: 63.00    Pack years: 63.00    Types: Cigarettes    Quit date: 06/24/2021    Years since quitting: 0.2   Smokeless tobacco: Never   Tobacco comments:    Pt reports she quit smoking end of summer 2022  Vaping Use   Vaping Use: Never used  Substance and Sexual Activity   Alcohol use: No   Drug use: No   Sexual activity: Not on  file  Other Topics Concern   Not on file  Social History Narrative   Lives with sister; in [place] since 72 years; 1/2 ppd; no alcohol. Used to work in [place].       No living will   Requests son Orson Gear as health care POA   Would accept resuscitation --but no prolonged machines   Not sure about feeding tubes   Social Determinants of Health   Financial Resource Strain: Not on file  Food Insecurity: Not on file  Transportation Needs: Not on file  Physical Activity: Not on file  Stress: Not on file  Social Connections: Not on file  Intimate Partner Violence: Not on file    FAMILY HISTORY: Family History  Problem Relation Age of Onset   Stroke Father    Cancer Sister        breast cancer   Hypertension Mother    Hypertension Other        sibling   Diabetes Other        grandmother    ALLERGIES:  is allergic to nifedipine and metformin.  MEDICATIONS:  Current Outpatient Medications  Medication Sig Dispense Refill   albuterol (VENTOLIN HFA) 108 (90 Base) MCG/ACT inhaler Inhale 2 puffs into the lungs every 6 (six) hours as needed for wheezing or shortness of breath. 8.5 g 0   clonazePAM (KLONOPIN) 0.5 MG tablet Take 1-2 tablets (0.5-1 mg total) by mouth at bedtime. 60 tablet 0   ferrous sulfate 325 (65 FE) MG tablet Take 325 mg by mouth daily with breakfast.     glucose blood (ONETOUCH VERIO) test strip Use to check blood sugar once daily E11.69 100 each 12   lisinopril-hydrochlorothiazide (ZESTORETIC) 20-12.5 MG tablet TAKE 2 TABLETS BY MOUTH DAILY (Patient taking differently: Take 1 tablet by mouth daily. Take 1 tablet by  mouth daily) 180 tablet 3   metFORMIN (GLUCOPHAGE-XR) 500 MG 24 hr tablet TAKE 1 TABLET(500 MG) BY MOUTH DAILY WITH BREAKFAST 90 tablet 0   omeprazole (PRILOSEC) 40 MG capsule Take 1 capsule (40 mg total) by mouth in the morning and at bedtime. 180 capsule 3   OneTouch Delica Lancets 29J MISC 1 each by Does not apply route daily. Use to  obtain blood sugar sample daily E11.69 100 each 12   simvastatin (ZOCOR) 80 MG tablet TAKE 1 TABLET(80 MG) BY MOUTH DAILY 90 tablet 3   vitamin B-12 (CYANOCOBALAMIN) 1000 MCG tablet Take 1,000 mcg by mouth daily.     No current facility-administered medications for this visit.    PHYSICAL EXAMINATION: ECOG PERFORMANCE STATUS: 1 - Symptomatic but completely ambulatory  Vitals:   09/09/21 1350  BP: (!) 144/62  Pulse: (!) 106  Resp: 16  Temp: (!) 96.2 F (35.7 C)  SpO2: 100%   Filed Weights   09/09/21 1350  Weight: 87 lb (39.5  kg)    Physical Exam Constitutional:      Appearance: She is ill-appearing.     Comments: Wheelchair. Accompanied by daughter. Cachectic.   HENT:     Head: Normocephalic and atraumatic.     Mouth/Throat:     Pharynx: No oropharyngeal exudate.  Eyes:     General: No scleral icterus. Cardiovascular:     Rate and Rhythm: Normal rate and regular rhythm.  Pulmonary:     Effort: Pulmonary effort is normal. No respiratory distress.     Comments: Diminished bilaterally Abdominal:     General: There is no distension.     Palpations: Abdomen is soft.     Tenderness: There is no guarding.  Musculoskeletal:        General: No tenderness or deformity.     Cervical back: Normal range of motion and neck supple.  Skin:    General: Skin is warm and dry.  Neurological:     General: No focal deficit present.     Mental Status: She is alert and oriented to person, place, and time.  Psychiatric:        Mood and Affect: Mood and affect normal.        Behavior: Behavior normal.     LABORATORY DATA:  I have reviewed the data as listed Lab Results  Component Value Date   WBC 5.6 09/09/2021   HGB 8.9 (L) 09/09/2021   HCT 29.2 (L) 09/09/2021   MCV 83.4 09/09/2021   PLT 250 09/09/2021   Recent Labs    11/25/20 1126 03/11/21 1026 05/12/21 1018 06/10/21 1022  NA 139 136  --  138  K 3.8 4.3  --  4.3  CL 104 101  --  103  CO2 24 25  --  26  GLUCOSE 81  141*  --  106*  BUN 14 17  --  19  CREATININE 0.89 1.03* 1.10* 1.12*  CALCIUM 10.0 10.0  --  9.7  GFRNONAA >60 55*  --  50*  PROT  --  7.9  --  7.9  ALBUMIN  --  4.6  --  4.4  AST  --  18  --  17  ALT  --  9  --  8  ALKPHOS  --  54  --  62  BILITOT  --  0.7  --  0.5     RADIOGRAPHIC STUDIES: I have personally reviewed the radiological images as listed and agreed with the findings in the report. No results found.  ASSESSMENT & PLAN:   1.  Stage I non-small cell, favoring adenocarcinoma, cancer of the left upper lobe of lung-declined surgery.  Completed SBRT completed March 2022. Reviewed imaging from 05/13/21 which shows decrease in size of spiculated nodule in LUL now measuring 1.9 cm. She has imaging planned for January 2023 per Dr. Baruch Gouty. Reviewed surveillance guidelines including exam and chest CT +/-contrast every 3-6 months for 3 years then every 6 months for 2 years then LDCT annually.   2. Weight loss- unintentional weight loss of 25 lbs. Will get ct chest abdomen pelvis to evaluate further. I independently reviewed scan which was negative for progressive disease or other acute findings.   3. Breast pain- will order MM Diag breast with ultrasound. On exam she has area of palpable pain on left axilla. She has not had mammogram since 2018.   2. Hypermetabolic right parotid uptake on pet 11/13/20. Incidental. Asymptomatic. Hasn't seen ENT since 2015. Referral was sent at previous visit.  Again encouraged her to go be evaluated. She is agreeable. Will resend referral today.   3. Iron Deficiency Anemia- secondary to chronic GI Bleed/AVMs. Hemoglobin 10.5. Worse but generally stable. Ferritin 25, saturation 5%. TIBC is not elevated. Plan for venofer x 5.    4. Smoking cessation- cutting back. Offered referral to [facility] for smoking cessation or self referral to [facility] with Burgess Estelle. She declines.   Disposition:  Left diagnostic breast mammogram with ultrasound Ct chest  abdomen pelvis Venofer x 5 (willr eceive first dose today then 4 more) 3 mo- labs (cbc, ferritin, iron studies), [place], venofer- la   No problem-specific Assessment & Plan notes found for this encounter.  All questions were answered. The patient knows to call the clinic with any problems, questions or concerns.    Verlon Au, NP 09/09/2021

## 2021-09-09 NOTE — Telephone Encounter (Signed)
Name of Medication: clonazepam 0.5 mg Name of Pharmacy: [facility] Last Fill or Written Date and Quantity: # 74 on 08/10/21 Last Office Visit and Type: 09/01/2020 HFU Next Office Visit and Type: 11/24/2021 CPX

## 2021-09-09 NOTE — Patient Instructions (Signed)

## 2021-09-09 NOTE — Progress Notes (Signed)
Pt in for follow up for anemia. Pt verbalized having pain and soreness in left axilla.

## 2021-09-09 NOTE — Telephone Encounter (Signed)
[facility] Night - Client Nonclinical Telephone Record  [facility] Client [facility] Night - Client Client Site [place] Provider Viviana Simpler- MD Contact Type Call Who Is Calling Patient / Member / Family / Caregiver Caller Name [place] Phone Number [phone] Patient Name Kathryn Ware Patient DOB 1941/05/16 Call Type Message Only Information Provided Reason for Call Medication Question / Request Initial Comment Caller states she is needing a medication refill. She has two more pills for tonight but is having no current symptoms. Clonazepam. Additional Comment Declined triage. Office hours provided. Disp. Time Disposition Final User 09/09/2021 7:11:18 AM General Information Provided Yes Uvaldo Rising Call Closed By: Uvaldo Rising Transaction Date/Time: 09/09/2021 7:08:12 AM (ET

## 2021-09-15 ENCOUNTER — Inpatient Hospital Stay: Payer: Medicare Other

## 2021-09-15 ENCOUNTER — Other Ambulatory Visit: Payer: Self-pay | Admitting: Internal Medicine

## 2021-09-17 ENCOUNTER — Other Ambulatory Visit: Payer: Self-pay | Admitting: Radiology

## 2021-09-20 ENCOUNTER — Other Ambulatory Visit: Payer: Self-pay

## 2021-09-20 ENCOUNTER — Ambulatory Visit
Admission: RE | Admit: 2021-09-20 | Discharge: 2021-09-20 | Disposition: A | Discharge status: Home / Self Care | Payer: Medicare Other | Source: Ambulatory Visit | Attending: Unknown Physician Specialty | Admitting: Unknown Physician Specialty

## 2021-09-20 DIAGNOSIS — D110 Benign neoplasm of parotid gland: Secondary | ICD-10-CM | POA: Diagnosis not present

## 2021-09-20 DIAGNOSIS — K118 Other diseases of salivary glands: Secondary | ICD-10-CM | POA: Diagnosis not present

## 2021-09-20 DIAGNOSIS — K119 Disease of salivary gland, unspecified: Secondary | ICD-10-CM

## 2021-09-20 DIAGNOSIS — D37030 Neoplasm of uncertain behavior of the parotid salivary glands: Secondary | ICD-10-CM | POA: Diagnosis present

## 2021-09-20 NOTE — Procedures (Signed)
Interventional Radiology Procedure Note  Date of Procedure: 09/20/2021  Procedure: US guided right parotid lesion biopsy   Findings:  1. US guided right parotid gland lesion biopsy, FNA 25ga x4 passes    Complications: No immediate complications noted.   Estimated Blood Loss: minimal  Follow-up and Recommendations: 1. None    Albin Felling, MD  Vascular & Interventional Radiology  09/20/2021 2:03 PM

## 2021-09-21 LAB — CYTOLOGY - NON PAP

## 2021-09-23 ENCOUNTER — Other Ambulatory Visit: Payer: Self-pay

## 2021-09-23 ENCOUNTER — Inpatient Hospital Stay: Payer: Medicare Other | Attending: Internal Medicine

## 2021-09-23 DIAGNOSIS — D500 Iron deficiency anemia secondary to blood loss (chronic): Secondary | ICD-10-CM | POA: Insufficient documentation

## 2021-09-23 DIAGNOSIS — K922 Gastrointestinal hemorrhage, unspecified: Secondary | ICD-10-CM | POA: Insufficient documentation

## 2021-09-23 NOTE — Progress Notes (Signed)
This nurse and another RN attempted to get IV access as pt is here for venofer.  Unsuccessful . Will reschedule.

## 2021-09-28 ENCOUNTER — Emergency Department
Admission: EM | Admit: 2021-09-28 | Discharge: 2021-09-28 | Disposition: A | Discharge status: Home / Self Care | Payer: Medicare Other | Attending: Emergency Medicine | Admitting: Emergency Medicine

## 2021-09-28 ENCOUNTER — Emergency Department: Payer: Medicare Other

## 2021-09-28 ENCOUNTER — Other Ambulatory Visit: Payer: Self-pay

## 2021-09-28 DIAGNOSIS — N183 Chronic kidney disease, stage 3 unspecified: Secondary | ICD-10-CM | POA: Insufficient documentation

## 2021-09-28 DIAGNOSIS — Z85118 Personal history of other malignant neoplasm of bronchus and lung: Secondary | ICD-10-CM | POA: Insufficient documentation

## 2021-09-28 DIAGNOSIS — Z7984 Long term (current) use of oral hypoglycemic drugs: Secondary | ICD-10-CM | POA: Insufficient documentation

## 2021-09-28 DIAGNOSIS — R0789 Other chest pain: Secondary | ICD-10-CM | POA: Insufficient documentation

## 2021-09-28 DIAGNOSIS — Z20822 Contact with and (suspected) exposure to covid-19: Secondary | ICD-10-CM | POA: Insufficient documentation

## 2021-09-28 DIAGNOSIS — I959 Hypotension, unspecified: Secondary | ICD-10-CM | POA: Diagnosis not present

## 2021-09-28 DIAGNOSIS — R079 Chest pain, unspecified: Secondary | ICD-10-CM | POA: Diagnosis not present

## 2021-09-28 DIAGNOSIS — I130 Hypertensive heart and chronic kidney disease with heart failure and stage 1 through stage 4 chronic kidney disease, or unspecified chronic kidney disease: Secondary | ICD-10-CM | POA: Diagnosis not present

## 2021-09-28 DIAGNOSIS — I2510 Atherosclerotic heart disease of native coronary artery without angina pectoris: Secondary | ICD-10-CM | POA: Insufficient documentation

## 2021-09-28 DIAGNOSIS — Z79899 Other long term (current) drug therapy: Secondary | ICD-10-CM | POA: Diagnosis not present

## 2021-09-28 DIAGNOSIS — I5032 Chronic diastolic (congestive) heart failure: Secondary | ICD-10-CM | POA: Diagnosis not present

## 2021-09-28 DIAGNOSIS — Z87891 Personal history of nicotine dependence: Secondary | ICD-10-CM | POA: Insufficient documentation

## 2021-09-28 DIAGNOSIS — I10 Essential (primary) hypertension: Secondary | ICD-10-CM | POA: Diagnosis not present

## 2021-09-28 DIAGNOSIS — R000 Tachycardia, unspecified: Secondary | ICD-10-CM | POA: Diagnosis not present

## 2021-09-28 DIAGNOSIS — E1122 Type 2 diabetes mellitus with diabetic chronic kidney disease: Secondary | ICD-10-CM | POA: Insufficient documentation

## 2021-09-28 DIAGNOSIS — J439 Emphysema, unspecified: Secondary | ICD-10-CM | POA: Diagnosis not present

## 2021-09-28 LAB — COMPREHENSIVE METABOLIC PANEL
ALT: 36 U/L (ref 0–44)
AST: 28 U/L (ref 15–41)
Albumin: 3.8 g/dL (ref 3.5–5.0)
Alkaline Phosphatase: 47 U/L (ref 38–126)
Anion gap: 10 (ref 5–15)
BUN: 28 mg/dL — ABNORMAL HIGH (ref 8–23)
CO2: 26 mmol/L (ref 22–32)
Calcium: 10.3 mg/dL (ref 8.9–10.3)
Chloride: 106 mmol/L (ref 98–111)
Creatinine, Ser: 0.73 mg/dL (ref 0.44–1.00)
GFR, Estimated: >60 mL/min (ref >60)
Glucose, Bld: 105 mg/dL — ABNORMAL HIGH (ref 70–99)
Potassium: 4 mmol/L (ref 3.5–5.1)
Sodium: 142 mmol/L (ref 135–145)
Total Bilirubin: 0.6 mg/dL (ref 0.3–1.2)
Total Protein: 7.1 g/dL (ref 6.5–8.1)

## 2021-09-28 LAB — TROPONIN I (HIGH SENSITIVITY)
Troponin I (High Sensitivity): 30 ng/L — ABNORMAL HIGH (ref <18)
Troponin I (High Sensitivity): 29 ng/L — ABNORMAL HIGH (ref <18)

## 2021-09-28 LAB — CBC WITH DIFFERENTIAL/PLATELET
Abs Immature Granulocytes: 0.01 10*3/uL (ref 0.00–0.07)
Basophils Absolute: 0 10*3/uL (ref 0.0–0.1)
Basophils Relative: 0 %
Eosinophils Absolute: 0 10*3/uL (ref 0.0–0.5)
Eosinophils Relative: 0 %
HCT: 31.3 % — ABNORMAL LOW (ref 36.0–46.0)
Hemoglobin: 9.6 g/dL — ABNORMAL LOW (ref 12.0–15.0)
Immature Granulocytes: 0 %
Lymphocytes Relative: 18 %
Lymphs Abs: 0.6 10*3/uL — ABNORMAL LOW (ref 0.7–4.0)
MCH: 25.5 pg — ABNORMAL LOW (ref 26.0–34.0)
MCHC: 30.7 g/dL (ref 30.0–36.0)
MCV: 83.2 fL (ref 80.0–100.0)
Monocytes Absolute: 0.5 10*3/uL (ref 0.1–1.0)
Monocytes Relative: 16 %
Neutro Abs: 2.2 10*3/uL (ref 1.7–7.7)
Neutrophils Relative %: 66 %
Platelets: 266 10*3/uL (ref 150–400)
RBC: 3.76 MIL/uL — ABNORMAL LOW (ref 3.87–5.11)
RDW: 13.1 % (ref 11.5–15.5)
WBC: 3.4 10*3/uL — ABNORMAL LOW (ref 4.0–10.5)
nRBC: 0 % (ref 0.0–0.2)

## 2021-09-28 LAB — RESP PANEL BY RT-PCR (FLU A&B, COVID) ARPGX2
Influenza A by PCR: NEGATIVE
Influenza B by PCR: NEGATIVE
SARS Coronavirus 2 by RT PCR: NEGATIVE

## 2021-09-28 NOTE — Discharge Instructions (Signed)
-  Return to your dermatologist as discussed for further evaluation/treatment of ingrown hairs

## 2021-09-28 NOTE — ED Provider Notes (Signed)
[facility] Emergency Department Provider Note    ____________________________________________   Event Date/Time   First MD Initiated Contact with Patient 09/28/21 1627     (approximate)  I have reviewed the triage vital signs and the nursing notes.   HISTORY  Chief Complaint Arm Pain   HPI Kathryn Ware is a 80 y.o. female, history of CAD, CHF, HTN, and TIA, presents to the emergency department for evaluation of left-sided chest pain.  Patient states that this has been going on for the past 2 weeks.  She said she spoke to a dermatologist a couple weeks ago who said that she may have some ingrown hairs.  She says that most of the pain is around her armpit and medial chest side, but is unable to fully localize where the pain is mostly coming from.  She states that it hurts the most when laying on her left side while she is sleeping.  Additionally, she does report a recent cough and generally feeling unwell.  Denies shortness of breath, abdominal pain, swelling in extremities, fever/chills, or urinary symptoms.  History limited by: Mild cognitive impairment. Poor historian.  Past Medical History:  Diagnosis Date   Anemia    Aortic atherosclerosis ([place])    Blood transfusion without reported diagnosis    CAD (coronary artery disease)    CHF (congestive heart failure) (HCC)    CKD (chronic kidney disease), stage III (HCC)    Diabetes mellitus type II    GERD (gastroesophageal reflux disease)    GI (gastrointestinal bleed)    GI AVM (gastrointestinal arteriovenous vascular malformation)    stomach   History of echocardiogram 12/2013   a. 12/2013: EF 60-65%, DD, mild LVH, nl RV size & systolic function, mild MR/TR, mild AS, nl RVSP; b. TTE 04/2017: EF 60-65%, normal wall motion, GR1DD, mild MR, left atrium normal in size, normal RV systolic function, PASP normal    History of nuclear stress test    a. 12/2013: low risk, no sig WMA, nondiag EKG 2/2 baseline LVH w/  repol abnl, no sig ischemia, EF 70% no artifact   HLD (hyperlipidemia)    HTN (hypertension)    Hypercholesterolemia    IDA (iron deficiency anemia)    Mild cognitive impairment    PAD (peripheral artery disease) (HCC)    Polyp of colon, adenomatous    RLS (restless legs syndrome)    TIA (transient ischemic attack) 2021    Patient Active Problem List   Diagnosis Date Noted   Iron deficiency anemia 06/10/2021   Parotid nodule 06/10/2021   Malignant neoplasm of lung ([place]) 06/10/2021   Primary cancer of left upper lobe of lung ([place]) 12/10/2020   Tachycardia    GI bleed 08/22/2020   Chronic blood loss anemia 08/22/2020   Angiodysplasia of stomach and duodenum with hemorrhage    Gastritis with hemorrhage    Weakness    GI bleeding 06/24/2020   Symptomatic anemia 01/05/2020   HLD (hyperlipidemia) 01/05/2020   GERD (gastroesophageal reflux disease) 01/05/2020   Anxiety 01/05/2020   Chronic renal disease, stage III ([place]) 01/05/2020   Personal history of tobacco use, presenting hazards to health 01/05/2020   Syncope 01/05/2020   GIB (gastrointestinal bleeding) 01/05/2020   Peripheral arterial disease ([place]) 09/12/2019   Chronic bronchitis ([place]) 12/20/2018   Aortic atherosclerosis ([place]) 12/19/2017   Chronic diastolic CHF (congestive heart failure) ([place]) 05/31/2017   Malnutrition of mild degree ([place]) 03/30/2017   History of adenomatous polyp of colon 09/19/2016  Anemia due to chronic blood loss 07/05/2016   Restless legs syndrome (RLS) 02/23/2016   Duodenal arteriovenous malformation 03/19/2015   AKI (acute kidney injury) ([place]) 03/19/2015   Congenital gastrointestinal vessel anomaly 03/19/2015   Acute on chronic blood loss anemia 02/18/2015   Advance directive discussed with patient 02/18/2015   Nicotine dependence 10/29/2013   Routine general medical examination at a health care facility 08/17/2011   Mild cognitive impairment 08/17/2011   Type 2 diabetes mellitus with  hyperlipidemia ([place]) 02/02/2007   HYPERCHOLESTEROLEMIA 02/02/2007   Essential hypertension 02/02/2007   Angiodysplasia of small intestine ([place]) 02/02/2007   Benign essential hypertension 02/02/2007    Past Surgical History:  Procedure Laterality Date   ABDOMINAL ADHESION SURGERY  11/2001   Dr. Tamala Julian   ABDOMINAL HYSTERECTOMY  1976   RSO (fibroid)   COLONOSCOPY WITH PROPOFOL N/A 07/07/2016   Procedure: COLONOSCOPY WITH PROPOFOL;  Surgeon: Manya Silvas, MD;  Location: [facility] ENDOSCOPY;  Service: Endoscopy;  Laterality: N/A;   COLONOSCOPY WITH PROPOFOL N/A 12/14/2016   Procedure: COLONOSCOPY WITH PROPOFOL;  Surgeon: Manya Silvas, MD;  Location[facility] ENDOSCOPY;  Service: Endoscopy;  Laterality: N/A;   DOBUTAMINE STRESS ECHO  11/10   normal   DOPPLER ECHOCARDIOGRAPHY     EF 55%, mild MR, TR   ENTEROSCOPY N/A 06/08/2018   Procedure: ENTEROSCOPY;  Surgeon: Lin Landsman, MD;  Location: [place];  Service: Gastroenterology;  Laterality: N/A;   ENTEROSCOPY N/A 01/06/2020   Procedure: ENTEROSCOPY;  Surgeon: Lin Landsman, MD;  Location[facility]COPY;  Service: Gastroenterology;  Laterality: N/A;   ENTEROSCOPY N/A 08/22/2020   Procedure: ENTEROSCOPY;  Surgeon: [name], MD;  Location: [facility] ENDOSCOPY;  Service: Gastroenterology;  Laterality: N/A;   ESOPHAGOGASTRODUODENOSCOPY  07/2006   multiple angioectasias   ESOPHAGOGASTRODUODENOSCOPY Left 03/12/2015   Procedure: place tube in throat and evaluate stomach and duodenum for source of bleeding. Cauterize if concern for rebleeding.;  Surgeon: Hulen Luster, MD;  Location: [facility] ENDOSCOPY;  Service: Endoscopy;  Laterality: Left;   ESOPHAGOGASTRODUODENOSCOPY N/A 12/14/2016   Procedure: ESOPHAGOGASTRODUODENOSCOPY (EGD);  Surgeon: Manya Silvas, MD;  Location: [facility] ENDOSCOPY;  Service: Endoscopy;  Laterality: N/A;   ESOPHAGOGASTRODUODENOSCOPY N/A 07/19/2017   Procedure: ESOPHAGOGASTRODUODENOSCOPY (EGD);  Surgeon: Lin Landsman,  MD;  Location: [facility] ENDOSCOPY;  Service: Gastroenterology;  Laterality: N/A;   ESOPHAGOGASTRODUODENOSCOPY (EGD) WITH PROPOFOL N/A 11/23/2015   Procedure: ESOPHAGOGASTRODUODENOSCOPY (EGD) WITH PROPOFOL;  Surgeon: Hulen Luster, MD;  Location: [facility] ENDOSCOPY;  Service: Gastroenterology;  Laterality: N/A;   ESOPHAGOGASTRODUODENOSCOPY (EGD) WITH PROPOFOL N/A 07/07/2016   Procedure: ESOPHAGOGASTRODUODENOSCOPY (EGD) WITH PROPOFOL;  Surgeon: Manya Silvas, MD;  Location: [facility] ENDOSCOPY;  Service: Endoscopy;  Laterality: N/A;   ESOPHAGOGASTRODUODENOSCOPY (EGD) WITH PROPOFOL N/A 05/19/2017   Procedure: ESOPHAGOGASTRODUODENOSCOPY (EGD) WITH PROPOFOL;  Surgeon: Manya Silvas, MD;  Location: [facility] ENDOSCOPY;  Service: Endoscopy;  Laterality: N/A;   ESOPHAGOGASTRODUODENOSCOPY (EGD) WITH PROPOFOL N/A 06/26/2020   Procedure: ESOPHAGOGASTRODUODENOSCOPY (EGD) WITH PROPOFOL;  Surgeon: Lucilla Lame, MD;  Location: [facility] ENDOSCOPY;  Service: Endoscopy;  Laterality: N/A;   laryngeal polyp  10/2008   Dr. Tami Ribas   TOP     VIDEO BRONCHOSCOPY WITH ENDOBRONCHIAL NAVIGATION Left 12/02/2020   Procedure: VIDEO BRONCHOSCOPY WITH ENDOBRONCHIAL NAVIGATION;  Surgeon: Tyler Pita, MD;  Location: [facility] ORS;  Service: Pulmonary;  Laterality: Left;    Prior to Admission medications   Medication Sig Start Date End Date Taking? Authorizing Provider  albuterol (VENTOLIN HFA) 108 (90 Base) MCG/ACT inhaler Inhale 2 puffs  into the lungs every 6 (six) hours as needed for wheezing or shortness of breath. 03/04/21   Venia Carbon, MD  clonazePAM (KLONOPIN) 0.5 MG tablet Take 1-2 tablets (0.5-1 mg total) by mouth at bedtime. 09/09/21   Venia Carbon, MD  ferrous sulfate 325 (65 FE) MG tablet Take 325 mg by mouth daily with breakfast.    [provider]  glucose blood (ONETOUCH VERIO) test strip Use to check blood sugar once daily E11.69 11/26/20   Venia Carbon, MD  lisinopril-hydrochlorothiazide (ZESTORETIC) 20-12.5 MG  tablet TAKE 2 TABLETS BY MOUTH DAILY Patient taking differently: Take 1 tablet by mouth daily. Take 1 tablet by  mouth daily 12/23/20   Venia Carbon, MD  metFORMIN (GLUCOPHAGE-XR) 500 MG 24 hr tablet TAKE 1 TABLET(500 MG) BY MOUTH DAILY WITH BREAKFAST 08/16/21   Venia Carbon, MD  omeprazole (PRILOSEC) 40 MG capsule Take 1 capsule (40 mg total) by mouth in the morning and at bedtime. 06/09/21   Venia Carbon, MD  OneTouch Delica Lancets 16X MISC 1 each by Does not apply route daily. Use to obtain blood sugar sample daily E11.69 11/26/20   Viviana Simpler I, MD  simvastatin (ZOCOR) 80 MG tablet TAKE 1 TABLET(80 MG) BY MOUTH DAILY 01/11/21   Venia Carbon, MD  vitamin B-12 (CYANOCOBALAMIN) 1000 MCG tablet Take 1,000 mcg by mouth daily.    [provider]    Allergies Nifedipine and Metformin  Family History  Problem Relation Age of Onset   Stroke Father    Cancer Sister        breast cancer   Hypertension Mother    Hypertension Other        sibling   Diabetes Other        grandmother    Social History Social History   Tobacco Use   Smoking status: Former    Packs/day: 1.00    Years: 63.00    Pack years: 63.00    Types: Cigarettes    Quit date: 06/24/2021    Years since quitting: 0.2   Smokeless tobacco: Never   Tobacco comments:    Pt reports she quit smoking end of summer 2022  Vaping Use   Vaping Use: Never used  Substance Use Topics   Alcohol use: No   Drug use: No    Review of Systems  Constitutional: Negative for fever/chills, weight loss, or fatigue.  Eyes: Negative for visual changes or discharge.  ENT: Negative for congestion, hearing changes, or sore throat.  Gastrointestinal: Negative for abdominal pain, nausea/vomiting, or diarrhea.  Genitourinary: Negative for dysuria or hematuria.  Musculoskeletal: Positive for pain in her left armpit/lateral chest region.  Negative for back pain or joint pain.  Skin: Negative for rashes or lesions.   Neurological: Negative for headache, syncope, dizziness, tremors, or numbness/tingling.   10-point ROS otherwise negative. ____________________________________________   PHYSICAL EXAM:  VITAL SIGNS: ED Triage Vitals  Enc Vitals Group     BP 09/28/21 1511 133/60     Pulse Rate 09/28/21 1511 (!) 101     Resp 09/28/21 1511 18     Temp 09/28/21 1511 97.9 F (36.6 C)     Temp Source 09/28/21 1511 Oral     SpO2 09/28/21 1511 97 %     Weight 09/28/21 1512 87 lb 1.3 oz (39.5 kg)     Height 09/28/21 1512 5\' 5"  (1.651 m)     Head Circumference --      Peak  Flow --      Pain Score 09/28/21 1510 10     Pain Loc --      Pain Edu? --      Excl. in [place]? --     Physical Exam Constitutional:      Appearance: Normal appearance.  HENT:     Head: Normocephalic and atraumatic.     Mouth/Throat:     Mouth: Mucous membranes are moist.     Pharynx: Oropharynx is clear. No oropharyngeal exudate or posterior oropharyngeal erythema.  Eyes:     Extraocular Movements: Extraocular movements intact.     Conjunctiva/sclera: Conjunctivae normal.     Pupils: Pupils are equal, round, and reactive to light.  Cardiovascular:     Rate and Rhythm: Normal rate and regular rhythm.     Pulses: Normal pulses.     Heart sounds: Normal heart sounds. No murmur heard.   No friction rub. No gallop.  Pulmonary:     Effort: Pulmonary effort is normal. No respiratory distress.     Breath sounds: Normal breath sounds. No wheezing, rhonchi or rales.  Chest:     Chest wall: No tenderness.  Abdominal:     General: Abdomen is flat. There is no distension.     Palpations: Abdomen is soft.     Tenderness: There is no abdominal tenderness. There is no guarding.  Musculoskeletal:        General: Normal range of motion.  Skin:    General: Skin is warm and dry.     Comments: Affected area in the left armpit was examined.  No erythema or obvious abscesses noted.  No notable folliculitis.  There are some possible ingrown  hairs along that region, but they do not seem to correlate with the location of the patient's pain which is diffusely along the lateral region of the patient's chest.  Neurological:     Mental Status: She is alert and oriented to person, place, and time. Mental status is at baseline.     Comments: Appears to have mild cognitive impairment, consistent with her previous documentation.  Psychiatric:        Mood and Affect: Mood normal.        Behavior: Behavior normal.        Thought Content: Thought content normal.        Judgment: Judgment normal.     ____________________________________________    LABS  (all labs ordered are listed, but only abnormal results are displayed)  Labs Reviewed  CBC WITH DIFFERENTIAL/PLATELET - Abnormal; Notable for the following components:      Result Value   WBC 3.4 (*)    RBC 3.76 (*)    Hemoglobin 9.6 (*)    HCT 31.3 (*)    MCH 25.5 (*)    Lymphs Abs 0.6 (*)    All other components within normal limits  COMPREHENSIVE METABOLIC PANEL - Abnormal; Notable for the following components:   Glucose, Bld 105 (*)    BUN 28 (*)    All other components within normal limits  TROPONIN I (HIGH SENSITIVITY) - Abnormal; Notable for the following components:   Troponin I (High Sensitivity) 30 (*)    All other components within normal limits  TROPONIN I (HIGH SENSITIVITY) - Abnormal; Notable for the following components:   Troponin I (High Sensitivity) 29 (*)    All other components within normal limits  RESP PANEL BY RT-PCR (FLU A&B, COVID) [id number]     ____________________________________________   EKG Sinus  tachycardia, rate of 101, PACs noted, T wave inversions in V4-V6, no AV blocks, no axis deviations.  Possible LVH.   EKG is similar to her previous EKGs in the past.   ____________________________________________    RADIOLOGY I personally viewed and evaluated these images as part of my medical decision making, as well as reviewing the written  report by the radiologist.  ED Provider Interpretation: I agree with the radiologist interpretation.  DG Chest 2 View  Result Date: 09/28/2021 CLINICAL DATA:  Chest pain EXAM: CHEST - 2 VIEW COMPARISON:  Chest x-ray 12/02/2020, CT chest 05/12/2021. FINDINGS: The heart and mediastinal contours are unchanged. Severe aortic calcification. Hyperinflation of the lungs. Flattening of the hemidiaphragms. No focal consolidation. Coarsened interstitial markings with no overt pulmonary edema. No pleural effusion. No pneumothorax. No acute osseous abnormality. IMPRESSION: 1. No active cardiopulmonary disease. 2. Aortic Atherosclerosis (ICD10-I70.0) and Emphysema (ICD10-J43.9). Electronically Signed   By: Iven Finn M.D.   On: 09/28/2021 17:38    ____________________________________________   PROCEDURES  Procedures   Medications - No data to display  Critical Care performed: No  ____________________________________________   INITIAL IMPRESSION / ASSESSMENT AND PLAN / ED COURSE  Pertinent labs & imaging results that were available during my care of the patient were reviewed by me and considered in my medical decision making (see chart for details).    Kathryn Ware is a 80 y.o. female, history of CAD, CHF, HTN, and TIA, presents to the emergency department for evaluation of left-sided chest pain.  Patient states that this has been going on for the past 2 weeks.  She said she spoke to a dermatologist a couple weeks ago who said that she may have some ingrown hairs.  She says that most of the pain is around her armpit and lateral chest side, but is unable to fully localize where the pain is mostly coming from.  She states that it hurts the most when laying on her left side while she is sleeping.  Reports as 9/10 in severity. Additionally, she does report a recent cough and generally feeling unwell.  Denies shortness of breath, abdominal pain, swelling in extremities, fever/chills, or urinary  symptoms.  Upon presentation, patient appears well.  She is sitting upright, comfortably in bed in no apparent distress.  Her physical exam is overall unremarkable.  The affected area in her left armpit region that appears to be bothering her shows no apparent abnormalities.  No erythema, abscess, or any type of folliculitis is seen with the exception of a few non-complicated possible ingrown hairs.  I attempted to clarify specifically which area is bothering her the most and she vaguely points all across the lateral side of her chest.   Her EKG is abnormal. Sinus tachycardia with notable T wave inversions V4 through V6.  However, her EKG is similar to her previous EKGs.  Given her risk factors though and questionable history, we will go ahead and screen for possible ACS.  Given her recent cough and general feeling of fatigue, will also order respiratory panel, CXR, and basic labs.  CBC shows mild anemia with a hemoglobin of 9.6.  No leukocytosis.  CMP is unremarkable.  Respiratory panel is negative for both influenza A and COVID.  Chest x-ray shows no active cardiopulmonary disease.  Her troponin level is notably elevated at 30.  However her second troponin is 29.  These levels are consistent with her previous levels over the past few years.  Given the patient's history, physical  exam, and initial work-up, I do not suspect any life-threatening pathology at this time.  She appears well at this time and is currently asking to go home.  There are some ingrown hairs in her left armpit which may be the source of the patient's irritation, though there are no obvious findings that warrant further treatment or work-up in the emergency department.  I will discharge the patient with some anticipatory guidance and return precautions if her symptoms worsen.  I encouraged her to follow-up with her dermatologist for further evaluation of ingrown hairs if this continues to be a concern for her in the future.      ____________________________________________   FINAL CLINICAL IMPRESSION(S) / ED DIAGNOSES  Final diagnoses:  Other chest pain     NEW MEDICATIONS STARTED DURING THIS VISIT:  ED Discharge Orders     None        Note:  This document was prepared using Dragon voice recognition software and may include unintentional dictation errors.    Teodoro Spray, [place] 09/29/21 1056    Vanessa [place], MD 09/29/21 [phone]

## 2021-09-28 NOTE — ED Notes (Signed)
This RN called & spoke with pts sister, Freda Munro. Freda Munro is on the way to pick pt up to bring her back home.

## 2021-09-28 NOTE — ED Provider Notes (Signed)
Medical screening examination/treatment/procedure(s) were conducted as a shared visit with non-physician practitioner(s) and myself.  I personally evaluated the patient during the encounter.   Personally saw and evaluated the patient.  She is currently resting comfortably no distress without complaint.  Utilizing phone call her sister.  She has no concerns at this time understands plan to wait for additional heart test [place] the second troponin.  Her initial troponin is elevated but consistent with previous old, and given her EKG which I do not see has evidence of new ischemic abnormality and given her clinical assessment and examination I suspect this is unlikely to represent ACS.  Denies chest pain.  No shortness of breath.  Examination of her arm is normal with normal distal perfusion no swelling or edema no rash.   Delman Kitten, MD 09/28/21 [phone]

## 2021-09-28 NOTE — ED Notes (Signed)
This RN called & spoke with lab re: drawing repeat troponin. They will send someone.

## 2021-09-28 NOTE — ED Triage Notes (Addendum)
Pt here via [facility] with left arm that started 2 days ago. Pt states she has an ingrown hair under her left arm that is also causing her pain. Pt is supposed to get iron infusions soon but has poor vasculature so it has been delayed.    99% RA 176/64 102 [id number]-cbg

## 2021-09-29 ENCOUNTER — Other Ambulatory Visit: Payer: Self-pay | Admitting: *Deleted

## 2021-09-29 DIAGNOSIS — R2232 Localized swelling, mass and lump, left upper limb: Secondary | ICD-10-CM

## 2021-09-30 ENCOUNTER — Inpatient Hospital Stay: Payer: Medicare Other

## 2021-09-30 ENCOUNTER — Other Ambulatory Visit: Payer: Self-pay

## 2021-09-30 VITALS — BP 158/60 | HR 96 | Temp 97.0°F | Resp 18

## 2021-09-30 DIAGNOSIS — D500 Iron deficiency anemia secondary to blood loss (chronic): Secondary | ICD-10-CM | POA: Diagnosis not present

## 2021-09-30 DIAGNOSIS — D62 Acute posthemorrhagic anemia: Secondary | ICD-10-CM

## 2021-09-30 DIAGNOSIS — K922 Gastrointestinal hemorrhage, unspecified: Secondary | ICD-10-CM | POA: Diagnosis not present

## 2021-09-30 MED ORDER — IRON SUCROSE 20 MG/ML IV SOLN
200.0000 mg | Freq: Once | INTRAVENOUS | Status: AC
  Administered 2021-09-30: 200 mg via INTRAVENOUS
  Filled 2021-09-30: qty 10

## 2021-09-30 MED ORDER — SODIUM CHLORIDE 0.9 % IV SOLN
Freq: Once | INTRAVENOUS | Status: AC
  Filled 2021-09-30: qty 250

## 2021-09-30 MED ORDER — SODIUM CHLORIDE 0.9 % IV SOLN: 200.0000 mg | Freq: Once | INTRAVENOUS | Status: DC

## 2021-09-30 NOTE — Patient Instructions (Signed)

## 2021-10-05 ENCOUNTER — Telehealth: Payer: Self-pay

## 2021-10-05 NOTE — Telephone Encounter (Signed)
Left message to call office to see how pt is doing after recent ER visit for arm pain.

## 2021-10-07 ENCOUNTER — Inpatient Hospital Stay: Payer: Medicare Other

## 2021-10-07 ENCOUNTER — Other Ambulatory Visit: Payer: Self-pay

## 2021-10-07 VITALS — BP 181/63 | HR 90 | Temp 96.0°F | Resp 18

## 2021-10-07 DIAGNOSIS — K922 Gastrointestinal hemorrhage, unspecified: Secondary | ICD-10-CM | POA: Diagnosis not present

## 2021-10-07 DIAGNOSIS — D62 Acute posthemorrhagic anemia: Secondary | ICD-10-CM

## 2021-10-07 DIAGNOSIS — D500 Iron deficiency anemia secondary to blood loss (chronic): Secondary | ICD-10-CM | POA: Diagnosis not present

## 2021-10-07 MED ORDER — SODIUM CHLORIDE 0.9 % IV SOLN: 200.0000 mg | Freq: Once | INTRAVENOUS | Status: DC

## 2021-10-07 MED ORDER — SODIUM CHLORIDE 0.9 % IV SOLN
Freq: Once | INTRAVENOUS | Status: AC
  Filled 2021-10-07: qty 250

## 2021-10-07 MED ORDER — IRON SUCROSE 20 MG/ML IV SOLN
200.0000 mg | Freq: Once | INTRAVENOUS | Status: AC
  Administered 2021-10-07: 200 mg via INTRAVENOUS
  Filled 2021-10-07: qty 10

## 2021-10-07 NOTE — Progress Notes (Signed)
Patient's sister informed RN that patient "feels horrible after each iron infusion". The sister also stated that the patient fell just prior to coming in for infusion appt. She is c/o her right knee. Sister would like the patient to be seen and have her labs checked. Beckey Rutter, NP & Billey Chang, NP made aware.

## 2021-10-07 NOTE — Progress Notes (Signed)
B/P 184/58, pt reports having a headache last night but not at this time. Pt denies any other symptoms such as SOB, chest pain, or blurred Vision. Beckey Rutter NP aware. Per Beckey Rutter NP okay to proceed with Venofer at this time, pt to call PCP or Cardiologist to follow up about B/P Pt aware and verbalizes understanding.    Pt tolerated infusion well, no s/s of distress pt denies any complaints at this time. Per Beckey Rutter NP Pt educated to call PCP ASAP today to have B/P, and knee evaluated, if unable to be seen by PCP pt to be evaluated in ER. Pt verbalizes understanding.  Pt stable at discharge.

## 2021-10-07 NOTE — Patient Instructions (Signed)

## 2021-10-08 ENCOUNTER — Other Ambulatory Visit: Payer: Self-pay | Admitting: Internal Medicine

## 2021-10-08 MED ORDER — CLONAZEPAM 0.5 MG PO TABS: 0.5000 mg | ORAL_TABLET | Freq: Every day | ORAL | 0 refills | Status: AC

## 2021-10-08 MED ORDER — ALBUTEROL SULFATE HFA 108 (90 BASE) MCG/ACT IN AERS: 2.0000 | INHALATION_SPRAY | Freq: Four times a day (QID) | RESPIRATORY_TRACT | 0 refills | Status: AC | PRN

## 2021-10-08 NOTE — Telephone Encounter (Signed)
°  Encourage patient to contact the pharmacy for refills or they can request refills through [place]:  Please schedule appointment if longer than 1 year  NEXT APPOINTMENT DATE:  MEDICATION:albuterol (VENTOLIN HFA) 108 (90 Base) MCG/ACT inhaler   clonazePAM (KLONOPIN) 0.5 MG tablet   Is the patient out of medication?   PHARMACY:[facility] [place], [place] -   Let patient know to contact pharmacy at the end of the day to make sure medication is ready.  Please notify patient to allow 48-72 hours to process  CLINICAL FILLS OUT ALL BELOW:   LAST REFILL:  QTY:  REFILL DATE:    OTHER COMMENTS:    Okay for refill?  Please advise

## 2021-10-08 NOTE — Telephone Encounter (Signed)
Last filled 09-09-21 #60 Last OV 09-01-20 Next OV 10-11-21 Kathryn Ware   I did the albuterol.

## 2021-10-11 ENCOUNTER — Ambulatory Visit: Payer: Medicare Other | Admitting: Internal Medicine

## 2021-10-11 ENCOUNTER — Other Ambulatory Visit: Payer: Self-pay

## 2021-10-11 ENCOUNTER — Ambulatory Visit
Admission: RE | Admit: 2021-10-11 | Discharge: 2021-10-11 | Disposition: A | Discharge status: Home / Self Care | Payer: Medicare Other | Source: Ambulatory Visit | Attending: Nurse Practitioner | Admitting: Nurse Practitioner

## 2021-10-11 DIAGNOSIS — K118 Other diseases of salivary glands: Secondary | ICD-10-CM | POA: Diagnosis not present

## 2021-10-11 DIAGNOSIS — D509 Iron deficiency anemia, unspecified: Secondary | ICD-10-CM

## 2021-10-11 DIAGNOSIS — C3412 Malignant neoplasm of upper lobe, left bronchus or lung: Secondary | ICD-10-CM | POA: Diagnosis not present

## 2021-10-11 DIAGNOSIS — R590 Localized enlarged lymph nodes: Secondary | ICD-10-CM | POA: Diagnosis not present

## 2021-10-11 DIAGNOSIS — R634 Abnormal weight loss: Secondary | ICD-10-CM | POA: Diagnosis not present

## 2021-10-11 DIAGNOSIS — J432 Centrilobular emphysema: Secondary | ICD-10-CM | POA: Diagnosis not present

## 2021-10-11 DIAGNOSIS — C3492 Malignant neoplasm of unspecified part of left bronchus or lung: Secondary | ICD-10-CM | POA: Diagnosis not present

## 2021-10-11 DIAGNOSIS — I700 Atherosclerosis of aorta: Secondary | ICD-10-CM | POA: Diagnosis not present

## 2021-10-11 DIAGNOSIS — N261 Atrophy of kidney (terminal): Secondary | ICD-10-CM | POA: Diagnosis not present

## 2021-10-11 DIAGNOSIS — I2510 Atherosclerotic heart disease of native coronary artery without angina pectoris: Secondary | ICD-10-CM | POA: Diagnosis not present

## 2021-10-11 MED ORDER — IOHEXOL 300 MG/ML  SOLN
85.0000 mL | Freq: Once | INTRAMUSCULAR | Status: AC | PRN
  Administered 2021-10-11: 12:00:00 85 mL via INTRAVENOUS

## 2021-10-14 ENCOUNTER — Telehealth: Payer: Self-pay | Admitting: Nurse Practitioner

## 2021-10-14 ENCOUNTER — Inpatient Hospital Stay: Payer: Medicare Other

## 2021-10-14 NOTE — Telephone Encounter (Signed)
Caregiver called to cancel appt for today. Pt is sick.

## 2021-10-21 ENCOUNTER — Inpatient Hospital Stay: Payer: Medicare Other

## 2021-10-21 ENCOUNTER — Other Ambulatory Visit: Payer: Self-pay

## 2021-10-21 VITALS — BP 156/60 | HR 88 | Temp 96.7°F | Resp 16

## 2021-10-21 DIAGNOSIS — D500 Iron deficiency anemia secondary to blood loss (chronic): Secondary | ICD-10-CM | POA: Diagnosis not present

## 2021-10-21 DIAGNOSIS — D62 Acute posthemorrhagic anemia: Secondary | ICD-10-CM

## 2021-10-21 DIAGNOSIS — K922 Gastrointestinal hemorrhage, unspecified: Secondary | ICD-10-CM | POA: Diagnosis not present

## 2021-10-21 MED ORDER — SODIUM CHLORIDE 0.9 % IV SOLN
Freq: Once | INTRAVENOUS | Status: AC
  Filled 2021-10-21: qty 250

## 2021-10-21 MED ORDER — SODIUM CHLORIDE 0.9 % IV SOLN: 200.0000 mg | Freq: Once | INTRAVENOUS | Status: DC

## 2021-10-21 MED ORDER — IRON SUCROSE 20 MG/ML IV SOLN
200.0000 mg | Freq: Once | INTRAVENOUS | Status: AC
  Administered 2021-10-21: 14:00:00 200 mg via INTRAVENOUS
  Filled 2021-10-21: qty 10

## 2021-10-21 NOTE — Patient Instructions (Signed)
[facility]   ?Discharge Instructions: ?Thank you for choosing [facility] to provide your oncology and hematology care.  ?If you have a lab appointment with the Cancer Center, please go directly to the Cancer Center and check in at the registration area. ?  ?We strive to give you quality time with your provider. You may need to reschedule your appointment if you arrive late (15 or more minutes).  Arriving late affects you and other patients whose appointments are after yours.  Also, if you miss three or more appointments without notifying the office, you may be dismissed from the clinic at the provider?s discretion.    ?  ?For prescription refill requests, have your pharmacy contact our office and allow 72 hours for refills to be completed.   ? ?Today you received the following: Venofer.    ?  ?BELOW ARE SYMPTOMS THAT SHOULD BE REPORTED IMMEDIATELY: ?*FEVER GREATER THAN 100.4 F (38 ?C) OR HIGHER ?*CHILLS OR SWEATING ?*NAUSEA AND VOMITING THAT IS NOT CONTROLLED WITH YOUR NAUSEA MEDICATION ?*UNUSUAL SHORTNESS OF BREATH ?*UNUSUAL BRUISING OR BLEEDING ?*URINARY PROBLEMS (pain or burning when urinating, or frequent urination) ?*BOWEL PROBLEMS (unusual diarrhea, constipation, pain near the anus) ?TENDERNESS IN MOUTH AND THROAT WITH OR WITHOUT PRESENCE OF ULCERS (sore throat, sores in mouth, or a toothache) ?UNUSUAL RASH, SWELLING OR PAIN  ?UNUSUAL VAGINAL DISCHARGE OR ITCHING  ? ?Items with * indicate a potential emergency and should be followed up as soon as possible or go to the Emergency Department if any problems should occur. ? ?Should you have questions after your visit or need to cancel or reschedule your appointment, please contact [facility]  Dept: [phone]  and follow the prompts.  Office hours are 8:00 a.m. to 4:30 p.m. Monday - Friday. Please note that voicemails left after 4:00 p.m. may not be returned until the following  business day.  We are closed weekends and major holidays. You have access to a nurse at all times for urgent questions. Please call the main number to the clinic Dept: [phone] and follow the prompts. ? ?For any non-urgent questions, you may also contact your provider using MyChart. We now offer e-Visits for anyone 18 and older to request care online for non-urgent symptoms. For details visit [email or web address]. ?  ?Also download the MyChart app! Go to the app store, search "MyChart", open the app, select [facility], and log in with your MyChart username and password. ? ?Due to Covid, a mask is required upon entering the hospital/clinic. If you do not have a mask, one will be given to you upon arrival. For doctor visits, patients may have 1 support person aged 18 or older with them. For treatment visits, patients cannot have anyone with them due to current Covid guidelines and our immunocompromised population.  ?

## 2021-10-26 ENCOUNTER — Ambulatory Visit: Payer: Medicare Other

## 2021-10-26 ENCOUNTER — Ambulatory Visit
Admission: RE | Admit: 2021-10-26 | Discharge: 2021-10-26 | Disposition: A | Discharge status: Home / Self Care | Payer: Medicare Other | Source: Ambulatory Visit | Attending: Nurse Practitioner | Admitting: Nurse Practitioner

## 2021-10-26 ENCOUNTER — Other Ambulatory Visit: Payer: Self-pay

## 2021-10-26 DIAGNOSIS — R2232 Localized swelling, mass and lump, left upper limb: Secondary | ICD-10-CM

## 2021-10-26 DIAGNOSIS — K118 Other diseases of salivary glands: Secondary | ICD-10-CM | POA: Diagnosis not present

## 2021-10-26 DIAGNOSIS — N644 Mastodynia: Secondary | ICD-10-CM | POA: Diagnosis not present

## 2021-10-26 DIAGNOSIS — R921 Mammographic calcification found on diagnostic imaging of breast: Secondary | ICD-10-CM | POA: Diagnosis not present

## 2021-10-26 DIAGNOSIS — R590 Localized enlarged lymph nodes: Secondary | ICD-10-CM | POA: Insufficient documentation

## 2021-10-27 ENCOUNTER — Telehealth: Payer: Self-pay

## 2021-10-27 NOTE — Telephone Encounter (Signed)
I called and spoke with patient to let her know that her mammogram results came back negative.

## 2021-10-27 NOTE — Telephone Encounter (Signed)
I spoke with a patient to let her know that her CT Scan results came back negative of malignancy.

## 2021-10-28 ENCOUNTER — Inpatient Hospital Stay: Payer: Medicare Other | Attending: Internal Medicine

## 2021-10-28 ENCOUNTER — Other Ambulatory Visit: Payer: Self-pay

## 2021-10-28 VITALS — BP 151/60 | HR 93 | Temp 97.6°F | Resp 18

## 2021-10-28 DIAGNOSIS — D62 Acute posthemorrhagic anemia: Secondary | ICD-10-CM

## 2021-10-28 DIAGNOSIS — D509 Iron deficiency anemia, unspecified: Secondary | ICD-10-CM | POA: Insufficient documentation

## 2021-10-28 MED ORDER — IRON SUCROSE 20 MG/ML IV SOLN
200.0000 mg | Freq: Once | INTRAVENOUS | Status: AC
  Administered 2021-10-28: 200 mg via INTRAVENOUS
  Filled 2021-10-28: qty 10

## 2021-10-28 MED ORDER — SODIUM CHLORIDE 0.9 % IV SOLN: 200.0000 mg | Freq: Once | INTRAVENOUS | Status: DC

## 2021-10-28 MED ORDER — SODIUM CHLORIDE 0.9 % IV SOLN
Freq: Once | INTRAVENOUS | Status: AC
  Filled 2021-10-28: qty 250

## 2021-10-28 NOTE — Patient Instructions (Signed)
[facility]   ?Discharge Instructions: ?Thank you for choosing [facility] to provide your oncology and hematology care.  ?If you have a lab appointment with the Cancer Center, please go directly to the Cancer Center and check in at the registration area. ?  ?We strive to give you quality time with your provider. You may need to reschedule your appointment if you arrive late (15 or more minutes).  Arriving late affects you and other patients whose appointments are after yours.  Also, if you miss three or more appointments without notifying the office, you may be dismissed from the clinic at the provider?s discretion.    ?  ?For prescription refill requests, have your pharmacy contact our office and allow 72 hours for refills to be completed.   ? ?Today you received the following: Venofer.    ?  ?BELOW ARE SYMPTOMS THAT SHOULD BE REPORTED IMMEDIATELY: ?*FEVER GREATER THAN 100.4 F (38 ?C) OR HIGHER ?*CHILLS OR SWEATING ?*NAUSEA AND VOMITING THAT IS NOT CONTROLLED WITH YOUR NAUSEA MEDICATION ?*UNUSUAL SHORTNESS OF BREATH ?*UNUSUAL BRUISING OR BLEEDING ?*URINARY PROBLEMS (pain or burning when urinating, or frequent urination) ?*BOWEL PROBLEMS (unusual diarrhea, constipation, pain near the anus) ?TENDERNESS IN MOUTH AND THROAT WITH OR WITHOUT PRESENCE OF ULCERS (sore throat, sores in mouth, or a toothache) ?UNUSUAL RASH, SWELLING OR PAIN  ?UNUSUAL VAGINAL DISCHARGE OR ITCHING  ? ?Items with * indicate a potential emergency and should be followed up as soon as possible or go to the Emergency Department if any problems should occur. ? ?Should you have questions after your visit or need to cancel or reschedule your appointment, please contact [facility]  Dept: [phone]  and follow the prompts.  Office hours are 8:00 a.m. to 4:30 p.m. Monday - Friday. Please note that voicemails left after 4:00 p.m. may not be returned until the following  business day.  We are closed weekends and major holidays. You have access to a nurse at all times for urgent questions. Please call the main number to the clinic Dept: [phone] and follow the prompts. ? ?For any non-urgent questions, you may also contact your provider using MyChart. We now offer e-Visits for anyone 18 and older to request care online for non-urgent symptoms. For details visit [email or web address]. ?  ?Also download the MyChart app! Go to the app store, search "MyChart", open the app, select [facility], and log in with your MyChart username and password. ? ?Due to Covid, a mask is required upon entering the hospital/clinic. If you do not have a mask, one will be given to you upon arrival. For doctor visits, patients may have 1 support person aged 18 or older with them. For treatment visits, patients cannot have anyone with them due to current Covid guidelines and our immunocompromised population.  ?

## 2021-11-01 ENCOUNTER — Emergency Department: Payer: Medicare Other

## 2021-11-01 ENCOUNTER — Inpatient Hospital Stay: Payer: Medicare Other

## 2021-11-01 ENCOUNTER — Telehealth: Payer: Self-pay | Admitting: *Deleted

## 2021-11-01 ENCOUNTER — Other Ambulatory Visit: Payer: Self-pay

## 2021-11-01 DIAGNOSIS — I2510 Atherosclerotic heart disease of native coronary artery without angina pectoris: Secondary | ICD-10-CM | POA: Diagnosis present

## 2021-11-01 DIAGNOSIS — D500 Iron deficiency anemia secondary to blood loss (chronic): Secondary | ICD-10-CM | POA: Diagnosis not present

## 2021-11-01 DIAGNOSIS — E1151 Type 2 diabetes mellitus with diabetic peripheral angiopathy without gangrene: Secondary | ICD-10-CM | POA: Diagnosis present

## 2021-11-01 DIAGNOSIS — I509 Heart failure, unspecified: Secondary | ICD-10-CM | POA: Diagnosis present

## 2021-11-01 DIAGNOSIS — N183 Chronic kidney disease, stage 3 unspecified: Secondary | ICD-10-CM | POA: Diagnosis present

## 2021-11-01 DIAGNOSIS — I499 Cardiac arrhythmia, unspecified: Secondary | ICD-10-CM | POA: Diagnosis not present

## 2021-11-01 DIAGNOSIS — T884XXA Failed or difficult intubation, initial encounter: Secondary | ICD-10-CM | POA: Diagnosis not present

## 2021-11-01 DIAGNOSIS — R0600 Dyspnea, unspecified: Secondary | ICD-10-CM

## 2021-11-01 DIAGNOSIS — E1165 Type 2 diabetes mellitus with hyperglycemia: Secondary | ICD-10-CM | POA: Diagnosis not present

## 2021-11-01 DIAGNOSIS — E1122 Type 2 diabetes mellitus with diabetic chronic kidney disease: Secondary | ICD-10-CM | POA: Diagnosis not present

## 2021-11-01 DIAGNOSIS — Z789 Other specified health status: Secondary | ICD-10-CM

## 2021-11-01 DIAGNOSIS — Z923 Personal history of irradiation: Secondary | ICD-10-CM

## 2021-11-01 DIAGNOSIS — F03918 Unspecified dementia, unspecified severity, with other behavioral disturbance: Secondary | ICD-10-CM | POA: Diagnosis not present

## 2021-11-01 DIAGNOSIS — J9602 Acute respiratory failure with hypercapnia: Secondary | ICD-10-CM | POA: Diagnosis not present

## 2021-11-01 DIAGNOSIS — Z743 Need for continuous supervision: Secondary | ICD-10-CM | POA: Diagnosis not present

## 2021-11-01 DIAGNOSIS — I214 Non-ST elevation (NSTEMI) myocardial infarction: Secondary | ICD-10-CM | POA: Diagnosis not present

## 2021-11-01 DIAGNOSIS — R570 Cardiogenic shock: Secondary | ICD-10-CM | POA: Diagnosis not present

## 2021-11-01 DIAGNOSIS — N179 Acute kidney failure, unspecified: Secondary | ICD-10-CM | POA: Diagnosis not present

## 2021-11-01 DIAGNOSIS — I5031 Acute diastolic (congestive) heart failure: Secondary | ICD-10-CM | POA: Diagnosis not present

## 2021-11-01 DIAGNOSIS — I469 Cardiac arrest, cause unspecified: Secondary | ICD-10-CM | POA: Diagnosis not present

## 2021-11-01 DIAGNOSIS — Z79899 Other long term (current) drug therapy: Secondary | ICD-10-CM

## 2021-11-01 DIAGNOSIS — J441 Chronic obstructive pulmonary disease with (acute) exacerbation: Secondary | ICD-10-CM | POA: Diagnosis present

## 2021-11-01 DIAGNOSIS — I5033 Acute on chronic diastolic (congestive) heart failure: Secondary | ICD-10-CM | POA: Diagnosis present

## 2021-11-01 DIAGNOSIS — Z66 Do not resuscitate: Secondary | ICD-10-CM | POA: Diagnosis not present

## 2021-11-01 DIAGNOSIS — Z515 Encounter for palliative care: Secondary | ICD-10-CM | POA: Diagnosis not present

## 2021-11-01 DIAGNOSIS — Z681 Body mass index (BMI) 19 or less, adult: Secondary | ICD-10-CM

## 2021-11-01 DIAGNOSIS — I700 Atherosclerosis of aorta: Secondary | ICD-10-CM | POA: Diagnosis present

## 2021-11-01 DIAGNOSIS — R296 Repeated falls: Secondary | ICD-10-CM | POA: Diagnosis not present

## 2021-11-01 DIAGNOSIS — E1169 Type 2 diabetes mellitus with other specified complication: Secondary | ICD-10-CM | POA: Diagnosis present

## 2021-11-01 DIAGNOSIS — Z7984 Long term (current) use of oral hypoglycemic drugs: Secondary | ICD-10-CM

## 2021-11-01 DIAGNOSIS — E0590 Thyrotoxicosis, unspecified without thyrotoxic crisis or storm: Secondary | ICD-10-CM | POA: Diagnosis not present

## 2021-11-01 DIAGNOSIS — D631 Anemia in chronic kidney disease: Secondary | ICD-10-CM | POA: Diagnosis not present

## 2021-11-01 DIAGNOSIS — K31819 Angiodysplasia of stomach and duodenum without bleeding: Secondary | ICD-10-CM | POA: Diagnosis present

## 2021-11-01 DIAGNOSIS — I130 Hypertensive heart and chronic kidney disease with heart failure and stage 1 through stage 4 chronic kidney disease, or unspecified chronic kidney disease: Principal | ICD-10-CM | POA: Diagnosis present

## 2021-11-01 DIAGNOSIS — Z87891 Personal history of nicotine dependence: Secondary | ICD-10-CM

## 2021-11-01 DIAGNOSIS — Z8249 Family history of ischemic heart disease and other diseases of the circulatory system: Secondary | ICD-10-CM

## 2021-11-01 DIAGNOSIS — I10 Essential (primary) hypertension: Secondary | ICD-10-CM | POA: Diagnosis present

## 2021-11-01 DIAGNOSIS — Z85118 Personal history of other malignant neoplasm of bronchus and lung: Secondary | ICD-10-CM

## 2021-11-01 DIAGNOSIS — G3184 Mild cognitive impairment, so stated: Secondary | ICD-10-CM | POA: Diagnosis present

## 2021-11-01 DIAGNOSIS — Z888 Allergy status to other drugs, medicaments and biological substances status: Secondary | ICD-10-CM

## 2021-11-01 DIAGNOSIS — K5520 Angiodysplasia of colon without hemorrhage: Secondary | ICD-10-CM | POA: Diagnosis present

## 2021-11-01 DIAGNOSIS — Z8673 Personal history of transient ischemic attack (TIA), and cerebral infarction without residual deficits: Secondary | ICD-10-CM

## 2021-11-01 DIAGNOSIS — R531 Weakness: Secondary | ICD-10-CM | POA: Diagnosis not present

## 2021-11-01 DIAGNOSIS — G928 Other toxic encephalopathy: Secondary | ICD-10-CM | POA: Diagnosis not present

## 2021-11-01 DIAGNOSIS — R64 Cachexia: Secondary | ICD-10-CM | POA: Diagnosis present

## 2021-11-01 DIAGNOSIS — Z20822 Contact with and (suspected) exposure to covid-19: Secondary | ICD-10-CM | POA: Diagnosis not present

## 2021-11-01 DIAGNOSIS — G2581 Restless legs syndrome: Secondary | ICD-10-CM | POA: Diagnosis not present

## 2021-11-01 DIAGNOSIS — J439 Emphysema, unspecified: Secondary | ICD-10-CM | POA: Diagnosis not present

## 2021-11-01 DIAGNOSIS — E43 Unspecified severe protein-calorie malnutrition: Secondary | ICD-10-CM | POA: Diagnosis not present

## 2021-11-01 DIAGNOSIS — I6381 Other cerebral infarction due to occlusion or stenosis of small artery: Secondary | ICD-10-CM | POA: Diagnosis not present

## 2021-11-01 DIAGNOSIS — I255 Ischemic cardiomyopathy: Secondary | ICD-10-CM | POA: Diagnosis not present

## 2021-11-01 DIAGNOSIS — E0520 Thyrotoxicosis with toxic multinodular goiter without thyrotoxic crisis or storm: Secondary | ICD-10-CM | POA: Diagnosis not present

## 2021-11-01 DIAGNOSIS — J9601 Acute respiratory failure with hypoxia: Secondary | ICD-10-CM | POA: Diagnosis not present

## 2021-11-01 DIAGNOSIS — J449 Chronic obstructive pulmonary disease, unspecified: Secondary | ICD-10-CM | POA: Diagnosis not present

## 2021-11-01 DIAGNOSIS — C3412 Malignant neoplasm of upper lobe, left bronchus or lung: Secondary | ICD-10-CM | POA: Diagnosis not present

## 2021-11-01 DIAGNOSIS — R609 Edema, unspecified: Secondary | ICD-10-CM | POA: Diagnosis not present

## 2021-11-01 DIAGNOSIS — Z4682 Encounter for fitting and adjustment of non-vascular catheter: Secondary | ICD-10-CM | POA: Diagnosis not present

## 2021-11-01 DIAGNOSIS — I462 Cardiac arrest due to underlying cardiac condition: Secondary | ICD-10-CM | POA: Diagnosis not present

## 2021-11-01 DIAGNOSIS — I160 Hypertensive urgency: Secondary | ICD-10-CM | POA: Diagnosis not present

## 2021-11-01 DIAGNOSIS — R000 Tachycardia, unspecified: Secondary | ICD-10-CM | POA: Diagnosis present

## 2021-11-01 DIAGNOSIS — Z90710 Acquired absence of both cervix and uterus: Secondary | ICD-10-CM

## 2021-11-01 DIAGNOSIS — I6782 Cerebral ischemia: Secondary | ICD-10-CM | POA: Diagnosis not present

## 2021-11-01 DIAGNOSIS — R6889 Other general symptoms and signs: Secondary | ICD-10-CM | POA: Diagnosis not present

## 2021-11-01 DIAGNOSIS — R600 Localized edema: Secondary | ICD-10-CM | POA: Diagnosis not present

## 2021-11-01 DIAGNOSIS — E041 Nontoxic single thyroid nodule: Secondary | ICD-10-CM | POA: Diagnosis not present

## 2021-11-01 DIAGNOSIS — E7800 Pure hypercholesterolemia, unspecified: Secondary | ICD-10-CM | POA: Diagnosis present

## 2021-11-01 LAB — MAGNESIUM: Magnesium: 1.3 mg/dL — ABNORMAL LOW (ref 1.7–2.4)

## 2021-11-01 LAB — COMPREHENSIVE METABOLIC PANEL
ALT: 35 U/L (ref 0–44)
AST: 29 U/L (ref 15–41)
Albumin: 3.1 g/dL — ABNORMAL LOW (ref 3.5–5.0)
Alkaline Phosphatase: 57 U/L (ref 38–126)
Anion gap: 12 (ref 5–15)
BUN: 19 mg/dL (ref 8–23)
CO2: 26 mmol/L (ref 22–32)
Calcium: 9.7 mg/dL (ref 8.9–10.3)
Chloride: 101 mmol/L (ref 98–111)
Creatinine, Ser: 0.63 mg/dL (ref 0.44–1.00)
GFR, Estimated: >60 mL/min (ref >60)
Glucose, Bld: 70 mg/dL (ref 70–99)
Potassium: 3.6 mmol/L (ref 3.5–5.1)
Sodium: 139 mmol/L (ref 135–145)
Total Bilirubin: 0.5 mg/dL (ref 0.3–1.2)
Total Protein: 6.4 g/dL — ABNORMAL LOW (ref 6.5–8.1)

## 2021-11-01 LAB — CBC WITH DIFFERENTIAL/PLATELET
Abs Immature Granulocytes: 0 10*3/uL (ref 0.00–0.07)
Basophils Absolute: 0 10*3/uL (ref 0.0–0.1)
Basophils Relative: 0 %
Eosinophils Absolute: 0 10*3/uL (ref 0.0–0.5)
Eosinophils Relative: 0 %
HCT: 29.8 % — ABNORMAL LOW (ref 36.0–46.0)
Hemoglobin: 9.3 g/dL — ABNORMAL LOW (ref 12.0–15.0)
Immature Granulocytes: 0 %
Lymphocytes Relative: 15 %
Lymphs Abs: 0.5 10*3/uL — ABNORMAL LOW (ref 0.7–4.0)
MCH: 25.4 pg — ABNORMAL LOW (ref 26.0–34.0)
MCHC: 31.2 g/dL (ref 30.0–36.0)
MCV: 81.4 fL (ref 80.0–100.0)
Monocytes Absolute: 0.8 10*3/uL (ref 0.1–1.0)
Monocytes Relative: 23 %
Neutro Abs: 2.1 10*3/uL (ref 1.7–7.7)
Neutrophils Relative %: 62 %
Platelets: 226 10*3/uL (ref 150–400)
RBC: 3.66 MIL/uL — ABNORMAL LOW (ref 3.87–5.11)
RDW: 14.9 % (ref 11.5–15.5)
WBC: 3.4 10*3/uL — ABNORMAL LOW (ref 4.0–10.5)
nRBC: 0 % (ref 0.0–0.2)

## 2021-11-01 LAB — RESP PANEL BY RT-PCR (FLU A&B, COVID) ARPGX2
Influenza A by PCR: NEGATIVE
Influenza B by PCR: NEGATIVE
SARS Coronavirus 2 by RT PCR: NEGATIVE

## 2021-11-01 LAB — URINALYSIS, ROUTINE W REFLEX MICROSCOPIC
Bilirubin Urine: NEGATIVE
Glucose, UA: NEGATIVE mg/dL
Hgb urine dipstick: NEGATIVE
Ketones, ur: 20 mg/dL — AB
Leukocytes,Ua: NEGATIVE
Nitrite: NEGATIVE
Protein, ur: NEGATIVE mg/dL
Specific Gravity, Urine: 1.017 (ref 1.005–1.030)
pH: 6 (ref 5.0–8.0)

## 2021-11-01 LAB — BRAIN NATRIURETIC PEPTIDE: B Natriuretic Peptide: 1040.1 pg/mL — ABNORMAL HIGH (ref 0.0–100.0)

## 2021-11-01 LAB — TROPONIN I (HIGH SENSITIVITY)
Troponin I (High Sensitivity): 31 ng/L — ABNORMAL HIGH (ref <18)
Troponin I (High Sensitivity): 32 ng/L — ABNORMAL HIGH (ref <18)

## 2021-11-01 MED ORDER — FUROSEMIDE 10 MG/ML IJ SOLN
60.0000 mg | Freq: Once | INTRAMUSCULAR | Status: AC
  Administered 2021-11-01: 60 mg via INTRAVENOUS
  Filled 2021-11-01: qty 8

## 2021-11-01 MED ORDER — METOPROLOL TARTRATE 5 MG/5ML IV SOLN
5.0000 mg | Freq: Once | INTRAVENOUS | Status: AC
  Administered 2021-11-01: 5 mg via INTRAVENOUS
  Filled 2021-11-01: qty 5

## 2021-11-01 MED ORDER — NITROGLYCERIN 2 % TD OINT
0.5000 [in_us] | TOPICAL_OINTMENT | Freq: Once | TRANSDERMAL | Status: AC
  Administered 2021-11-01: 0.5 [in_us] via TOPICAL
  Filled 2021-11-01: qty 1

## 2021-11-01 NOTE — H&P (Addendum)
History and Physical    Kathryn Ware BLT:[id number] DOB: [birth year] DOA: 11/13/2021  PCP: Venia Carbon, MD   Patient coming from: home  I have personally briefly reviewed patient's relevant medical records in [place]  Chief Complaint: Lower extremity weakness and feeling like legs giving out  HPI: Kathryn Ware is a 81 y.o. female with medical history significant for DM, HTN, gastric AVMs with history of chronic blood loss anemia, on weekly iron transfusions by hematology, mild cognitive deficit, stage I NSCLC completed XRT 01/12/2021, who presents to the ED for evaluation of lower extremity weakness, feeling like her legs giving out, making it difficult to ambulate, causing her to fall a couple times but without injury.  She denies shortness of breath, denies orthopnea, chest pain, and denies cough fever or chills.    ED course: Afebrile, BP up to 200/72 with pulse 119-131, O2 sat 94 to 99% on room air Blood work with troponin 31-32, BNP 1040 Hemoglobin 9.3 Magnesium 1.3  EKG, personally viewed and interpreted: Sinus tachycardia at 125 with nonspecific ST-T wave changes  Chest x-ray: Increased mild bilateral interstitial thickening compatible with mild interstitial pulmonary edema Chronic moderate to high-grade hyperinflation compatible with COPD  Patient treated with Nitropaste and IV Lasix.  Hospitalist consulted for admission.   Review of Systems: As per HPI otherwise all other systems on review of systems negative.   Assessment/Plan  Bilateral lower extremity weakness - Etiology uncertain, possible physical deconditioning - Follow head CT - PT eval and treat     Hyptensive urgency - Last EF on file 75% with G1 DD 12/2019 -Blood pressure control.  SBP up to 200 in the emergency room - Troponins with non-ACS trend - Continue Nitropaste and IV Lasix - Continue lisinopril.  Add metoprolol - Hydralazine as needed for additional BP control - Echocardiogram in the  a.m. - Daily weights with intake and output monitoring  Sinus tachycardia -Improved with IV metoprolol - Not hypoxic or short of breath - We will get D-dimer, TSH - Monitor H&H to evaluate for worsening anemia (SCD for prophylaxis until h&H stable) - Lower extremity Dopplers negative for DVT     Chronic blood loss anemia   GI AVM (gastrointestinal arteriovenous vascular malformation) - Hemoglobin 9.3 - On weekly iron infusions - Continue to monitor  Underweight, suspect protein calorie malnutrition unspecified - BMI of 14.49 - Nutritionist evaluation    Type 2 diabetes mellitus with hyperlipidemia (HCC) - Blood sugar 70 - Monitor for hypoglycemia - Sliding scale insulin coverage    Mild cognitive impairment - Delirium precautions    Primary cancer of left upper lobe of lung ([place]) - Followed by oncology   DVT prophylaxis: Lovenox  Code Status: full code  Family Communication:  none  Disposition Plan: Back to previous home environment Consults called: none  Status:At the time of admission, it appears that the appropriate admission status for this patient is INPATIENT. This is judged to be reasonable and necessary in order to provide the required intensity of service to ensure the patient's safety given the presenting symptoms, physical exam findings, and initial radiographic and laboratory data in the context of their  Comorbid conditions.   Patient requires inpatient status due to high intensity of service, high risk for further deterioration and high frequency of surveillance required.   I certify that at the point of admission it is my clinical judgment that the patient will require inpatient hospital care spanning beyond 2 midnights  Physical Exam: Vitals:   11/20/2021 1225 10/26/2021 1441 11/10/2021 1927 11/23/2021 2109  BP: (!) 165/80  (!) 186/64 (!) 200/72  Pulse: (!) 119  (!) 131 (!) 124  Resp: 18  18 18   Temp: 98.2 F (36.8 C)     TempSrc: Oral     SpO2: 94%   99% 96%  Weight:  39.5 kg    Height:  5\' 5"  (1.651 m)     Constitutional: Alert, oriented x 3 . Not in any apparent distress HEENT:      Head: Normocephalic and atraumatic.         Eyes: PERLA, EOMI, Conjunctivae are normal. Sclera is non-icteric.       Mouth/Throat: Mucous membranes are moist.       Neck: Supple with no signs of meningismus. Cardiovascular: Tachycardic. No murmurs, gallops, or rubs. 2+ symmetrical distal pulses are present . No JVD. 2+LE edema Respiratory: Respiratory effort normal .Lungs sounds clear bilaterally. No wheezes, crackles, or rhonchi.  Gastrointestinal: Soft, non tender, non distended. Positive bowel sounds.  Genitourinary: No CVA tenderness. Musculoskeletal: Nontender with normal range of motion in all extremities. No cyanosis, or erythema of extremities. Neurologic:  Face is symmetric. Moving all extremities. No gross focal neurologic deficits . Skin: Skin is warm, dry.  No rash or ulcers Psychiatric: Mood and affect are appropriate     Past Medical History:  Diagnosis Date   Anemia    Aortic atherosclerosis ([place])    Blood transfusion without reported diagnosis    CAD (coronary artery disease)    CHF (congestive heart failure) (HCC)    CKD (chronic kidney disease), stage III (HCC)    Diabetes mellitus type II    GERD (gastroesophageal reflux disease)    GI (gastrointestinal bleed)    GI AVM (gastrointestinal arteriovenous vascular malformation)    stomach   History of echocardiogram 12/2013   a. 12/2013: EF 60-65%, DD, mild LVH, nl RV size & systolic function, mild MR/TR, mild AS, nl RVSP; b. TTE 04/2017: EF 60-65%, normal wall motion, GR1DD, mild MR, left atrium normal in size, normal RV systolic function, PASP normal    History of nuclear stress test    a. 12/2013: low risk, no sig WMA, nondiag EKG 2/2 baseline LVH w/ repol abnl, no sig ischemia, EF 70% no artifact   HLD (hyperlipidemia)    HTN (hypertension)    Hypercholesterolemia    IDA  (iron deficiency anemia)    Mild cognitive impairment    PAD (peripheral artery disease) (HCC)    Polyp of colon, adenomatous    RLS (restless legs syndrome)    TIA (transient ischemic attack) 2021    Past Surgical History:  Procedure Laterality Date   ABDOMINAL ADHESION SURGERY  11/2001   Dr. Tamala Julian   ABDOMINAL HYSTERECTOMY  1976   RSO (fibroid)   COLONOSCOPY WITH PROPOFOL N/A 07/07/2016   Procedure: COLONOSCOPY WITH PROPOFOL;  Surgeon: Manya Silvas, MD;  Location: [facility] ENDOSCOPY;  Service: Endoscopy;  Laterality: N/A;   COLONOSCOPY WITH PROPOFOL N/A 12/14/2016   Procedure: COLONOSCOPY WITH PROPOFOL;  Surgeon: Manya Silvas, MD;  Location: [facility] ENDOSCOPY;  Service: Endoscopy;  Laterality: N/A;   DOBUTAMINE STRESS ECHO  11/10   normal   DOPPLER ECHOCARDIOGRAPHY     EF 55%, mild MR, TR   ENTEROSCOPY N/A 06/08/2018   Procedure: ENTEROSCOPY;  Surgeon: Lin Landsman, MD;  Location: [facility];  Service: Gastroenterology;  Laterality: N/A;   ENTEROSCOPY N/A 01/06/2020   Procedure:  ENTEROSCOPY;  Surgeon: Lin Landsman, MD;  Location: [facility] ENDOSCOPY;  Service: Gastroenterology;  Laterality: N/A;   ENTEROSCOPY N/A 08/22/2020   Procedure: ENTEROSCOPY;  Surgeon: [name], MD;  Location: [facility] ENDOSCOPY;  Service: Gastroenterology;  Laterality: N/A;   ESOPHAGOGASTRODUODENOSCOPY  07/2006   multiple angioectasias   ESOPHAGOGASTRODUODENOSCOPY Left 03/12/2015   Procedure: place tube in throat and evaluate stomach and duodenum for source of bleeding. Cauterize if concern for rebleeding.;  Surgeon: Hulen Luster, MD;  Location: [facility] ENDOSCOPY;  Service: Endoscopy;  Laterality: Left;   ESOPHAGOGASTRODUODENOSCOPY N/A 12/14/2016   Procedure: ESOPHAGOGASTRODUODENOSCOPY (EGD);  Surgeon: Manya Silvas, MD;  Location: [facility] ENDOSCOPY;  Service: Endoscopy;  Laterality: N/A;   ESOPHAGOGASTRODUODENOSCOPY N/A 07/19/2017   Procedure: ESOPHAGOGASTRODUODENOSCOPY (EGD);  Surgeon: Lin Landsman, MD;  Location: [facility] ENDOSCOPY;  Service: Gastroenterology;  Laterality: N/A;   ESOPHAGOGASTRODUODENOSCOPY (EGD) WITH PROPOFOL N/A 11/23/2015   Procedure: ESOPHAGOGASTRODUODENOSCOPY (EGD) WITH PROPOFOL;  Surgeon: Hulen Luster, MD;  Location: [facility] ENDOSCOPY;  Service: Gastroenterology;  Laterality: N/A;   ESOPHAGOGASTRODUODENOSCOPY (EGD) WITH PROPOFOL N/A 07/07/2016   Procedure: ESOPHAGOGASTRODUODENOSCOPY (EGD) WITH PROPOFOL;  Surgeon: Manya Silvas, MD;  Location: [facility] ENDOSCOPY;  Service: Endoscopy;  Laterality: N/A;   ESOPHAGOGASTRODUODENOSCOPY (EGD) WITH PROPOFOL N/A 05/19/2017   Procedure: ESOPHAGOGASTRODUODENOSCOPY (EGD) WITH PROPOFOL;  Surgeon: Manya Silvas, MD;  Location: [facility] ENDOSCOPY;  Service: Endoscopy;  Laterality: N/A;   ESOPHAGOGASTRODUODENOSCOPY (EGD) WITH PROPOFOL N/A 06/26/2020   Procedure: ESOPHAGOGASTRODUODENOSCOPY (EGD) WITH PROPOFOL;  Surgeon: Lucilla Lame, MD;  Location: [facility] ENDOSCOPY;  Service: Endoscopy;  Laterality: N/A;   laryngeal polyp  10/2008   Dr. Tami Ribas   TOP     VIDEO BRONCHOSCOPY WITH ENDOBRONCHIAL NAVIGATION Left 12/02/2020   Procedure: VIDEO BRONCHOSCOPY WITH ENDOBRONCHIAL NAVIGATION;  Surgeon: Tyler Pita, MD;  Location: [facility] ORS;  Service: Pulmonary;  Laterality: Left;     reports that she quit smoking about 4 months ago. Her smoking use included cigarettes. She has a 63.00 pack-year smoking history. She has never used smokeless tobacco. She reports that she does not drink alcohol and does not use drugs.  Allergies  Allergen Reactions   Nifedipine Cough   Metformin Diarrhea    REACTION: diarrhea when dosage is increased, ok when taking one tablet daily, patient aware that she is on metformin when it is listed that she is allergic    Family History  Problem Relation Age of Onset   Stroke Father    Cancer Sister        breast cancer   Hypertension Mother    Hypertension Other        sibling   Diabetes Other        grandmother       Prior to Admission medications   Medication Sig Start Date End Date Taking? Authorizing Provider  albuterol (VENTOLIN HFA) 108 (90 Base) MCG/ACT inhaler Inhale 2 puffs into the lungs every 6 (six) hours as needed for wheezing or shortness of breath. 10/08/21   Venia Carbon, MD  clonazePAM (KLONOPIN) 0.5 MG tablet Take 1-2 tablets (0.5-1 mg total) by mouth at bedtime. 10/08/21   Venia Carbon, MD  ferrous sulfate 325 (65 FE) MG tablet Take 325 mg by mouth daily with breakfast.    [provider]  glucose blood (ONETOUCH VERIO) test strip Use to check blood sugar once daily E11.69 11/26/20   Venia Carbon, MD  lisinopril-hydrochlorothiazide (ZESTORETIC) 20-12.5 MG tablet TAKE 2 TABLETS BY MOUTH DAILY Patient taking differently: Take 1  tablet by mouth daily. Take 1 tablet by  mouth daily 12/23/20   Venia Carbon, MD  metFORMIN (GLUCOPHAGE-XR) 500 MG 24 hr tablet TAKE 1 TABLET(500 MG) BY MOUTH DAILY WITH BREAKFAST 08/16/21   Venia Carbon, MD  omeprazole (PRILOSEC) 40 MG capsule Take 1 capsule (40 mg total) by mouth in the morning and at bedtime. 06/09/21   Venia Carbon, MD  OneTouch Delica Lancets 01U MISC 1 each by Does not apply route daily. Use to obtain blood sugar sample daily E11.69 11/26/20   Viviana Simpler I, MD  simvastatin (ZOCOR) 80 MG tablet TAKE 1 TABLET(80 MG) BY MOUTH DAILY 01/11/21   Venia Carbon, MD  vitamin B-12 (CYANOCOBALAMIN) 1000 MCG tablet Take 1,000 mcg by mouth daily.    [provider]      Labs on Admission: I have personally reviewed following labs and imaging studies  CBC: Recent Labs  Lab 11/13/2021 1655  WBC 3.4*  NEUTROABS 2.1  HGB 9.3*  HCT 29.8*  MCV 81.4  PLT 932   Basic Metabolic Panel: Recent Labs  Lab 11/19/2021 1655  NA 139  K 3.6  CL 101  CO2 26  GLUCOSE 70  BUN 19  CREATININE 0.63  CALCIUM 9.7  MG 1.3*   GFR: Estimated Creatinine Clearance: 35.6 mL/min (by C-G formula based on SCr of 0.63  mg/dL). Liver Function Tests: Recent Labs  Lab 11/23/2021 1655  AST 29  ALT 35  ALKPHOS 57  BILITOT 0.5  PROT 6.4*  ALBUMIN 3.1*   No results for input(s): LIPASE, AMYLASE in the last 168 hours. No results for input(s): AMMONIA in the last 168 hours. Coagulation Profile: No results for input(s): INR, PROTIME in the last 168 hours. Cardiac Enzymes: No results for input(s): CKTOTAL, CKMB, CKMBINDEX, TROPONINI in the last 168 hours. BNP (last 3 results) No results for input(s): PROBNP in the last 8760 hours. HbA1C: No results for input(s): HGBA1C in the last 72 hours. CBG: No results for input(s): GLUCAP in the last 168 hours. Lipid Profile: No results for input(s): CHOL, HDL, LDLCALC, TRIG, CHOLHDL, LDLDIRECT in the last 72 hours. Thyroid Function Tests: No results for input(s): TSH, T4TOTAL, FREET4, T3FREE, THYROIDAB in the last 72 hours. Anemia Panel: No results for input(s): VITAMINB12, FOLATE, FERRITIN, TIBC, IRON, RETICCTPCT in the last 72 hours. Urine analysis:    Component Value Date/Time   COLORURINE YELLOW (A) 11/16/2021 1918   APPEARANCEUR CLEAR (A) 11/23/2021 1918   LABSPEC 1.017 11/03/2021 1918   PHURINE 6.0 11/22/2021 1918   GLUCOSEU NEGATIVE 11/11/2021 1918   HGBUR NEGATIVE 11/03/2021 1918   BILIRUBINUR NEGATIVE 11/12/2021 1918   KETONESUR 20 (A) 10/26/2021 1918   PROTEINUR NEGATIVE 11/17/2021 1918   NITRITE NEGATIVE 10/26/2021 1918   LEUKOCYTESUR NEGATIVE 11/20/2021 1918    Radiological Exams on Admission: DG Chest 1 View  Result Date: 10/28/2021 CLINICAL DATA:  Weakness EXAM: CHEST  1 VIEW COMPARISON:  09/28/2021 FINDINGS: Heart size is normal. Moderate to high-grade calcifications are again seen within the aortic arch. There is flattening of the diaphragms and moderate to high-grade hyperinflation, unchanged. There is new mild bilateral diffuse interstitial thickening. No focal airspace consolidation. Unchanged calcifications overlying the bilateral lung  apices, seen to correspond to vascular calcifications of the bilateral subclavian arteries on prior 10/11/2021 CT. No pleural effusion or pneumothorax. Moderate multilevel degenerative disc changes of the thoracic spine. Moderate thoracolumbar dextrocurvature with moderate left sided disc space narrowing. IMPRESSION:: IMPRESSION: 1. Chronic moderate to high-grade hyperinflation  compatible with COPD. 2. Increased mild bilateral interstitial thickening compatible with mild interstitial pulmonary edema. Electronically Signed   By: Yvonne Kendall   On: 11/12/2021 16:52       Athena Masse MD [facility]   11/11/2021, 9:36 PM

## 2021-11-01 NOTE — ED Triage Notes (Signed)
Pt comes via EMS with c/o bilateral foot swelling. Pt states this started 3 days ago. Pt has edema present and has had dec mobility.

## 2021-11-01 NOTE — Telephone Encounter (Signed)
PLEASE NOTE: All timestamps contained within this report are represented as [place] Standard Time. CONFIDENTIALTY NOTICE: This fax transmission is intended only for the addressee. It contains information that is legally privileged, confidential or otherwise protected from use or disclosure. If you are not the intended recipient, you are strictly prohibited from reviewing, disclosing, copying using or disseminating any of this information or taking any action in reliance on or regarding this information. If you have received this fax in error, please notify us immediately by telephone so that we can arrange for its return to [place]. Phone: [phone], Toll-Free: [phone], Fax: [phone] Page: 1 of 2 Call Id: [id number] [place] [facility] - Client TELEPHONE ADVICE RECORD [facility] Patient Name: Kathryn Ware [place] Gender: Female DOB: 07-06-1941 Age: 81 Y 45 M 19 D Return Phone Number: [phone] (Primary), [phone] (Secondary) Address: City/ State/ Zip: [place] [address] Client [facility] - Client Client Site [place] - Day Contact Type Call Who Is Calling Patient / Member / Family / Caregiver Call Type Triage / Clinical Relationship To Patient Self Return Phone Number [phone] (Primary) Chief Complaint Feet swelling Reason for Call Symptomatic / Request for [place] states both of her feet are swollen. Caller states she wants to see if anyone can send something over to the pharmacy. Translation No Nurse Assessment Nurse: Raenette Rover, RN, Zella Ball Date/Time (Eastern Time): 11/11/2021 11:00:29 AM Confirm and document reason for call. If symptomatic, describe symptoms. ---Caller states both of her feet are swollen. Caller states she wants to see if anyone can send something over to the pharmacy. Denies any sob and started a week ago. Does the patient have any new or  worsening symptoms? ---Yes Will a triage be completed? ---Yes Related visit to physician within the last 2 weeks? ---No Does the PT have any chronic conditions? (i.e. diabetes, asthma, this includes High risk factors for pregnancy, etc.) ---Yes List chronic conditions. ---diabetic Is this a behavioral health or substance abuse call? ---No Guidelines Guideline Title Affirmed Question Affirmed Notes Nurse Date/Time Eilene Ghazi Time) Leg Swelling and Edema [1] Can't walk or can barely walk AND [2] new-onset Raenette Rover, RN, Zella Ball 11/16/2021 11:02:01 AM Disp. Time Eilene Ghazi Time) Disposition Final User 11/05/2021 11:05:58 AM Go to ED Now Yes Raenette Rover, RN, Zella Ball PLEASE NOTE: All timestamps contained within this report are represented as [place] Standard Time. CONFIDENTIALTY NOTICE: This fax transmission is intended only for the addressee. It contains information that is legally privileged, confidential or otherwise protected from use or disclosure. If you are not the intended recipient, you are strictly prohibited from reviewing, disclosing, copying using or disseminating any of this information or taking any action in reliance on or regarding this information. If you have received this fax in error, please notify us immediately by telephone so that we can arrange for its return to [place]. Phone: [phone], Toll-Free: [phone], Fax: [phone] Page: 2 of 2 Call Id: [id number] [place] Disagree/Comply Comply Caller Understands Yes PreDisposition InappropriateToAsk Care Advice Given Per Guideline GO TO ED NOW: * Leave now. Drive carefully. CARE ADVICE given per Leg Swelling and Edema (Adult) guideline. Comments User: Wilson Singer, RN Date/Time Eilene Ghazi Time): 11/13/2021 11:07:28 AM Caller says she can walk barely and this is new for her. Denies being sob but sounds sob over the phone. recommended ER. Referrals [facility] - ED

## 2021-11-01 NOTE — ED Provider Notes (Signed)
[facility] Provider Note    None    (approximate)   History   Chief Complaint Foot Pain   HPI  Kathryn Ware is a 81 y.o. female, diabetes, hypertension, mild cognitive impairment, CHF, PAD, and lung cancer, presents the emergency department for evaluation of bilateral foot swelling.  Patient states that over the past 3 days she has noticed a gradual swelling in both of her feet.  She says they do not hurt, however they do make it difficult to walk.  Denies fever/chills, chest pain, shortness of breath, abdominal pain, back pain, nausea/vomiting, headache, or urinary symptoms.  I spoke with her sister, Solon Palm, over the phone who corroborated the story.  Additionally, she notes that the patient has fallen several times over the past few days due to her foot swelling.  History Limitations: Patient has mild cognitive impairment.     Physical Exam  Triage Vital Signs: ED Triage Vitals  Enc Vitals Group     BP 10/28/2021 1225 (!) 165/80     Pulse Rate 11/10/2021 1225 (!) 119     Resp 11/16/2021 1225 18     Temp 11/04/2021 1225 98.2 F (36.8 C)     Temp Source 11/07/2021 1225 Oral     SpO2 11/09/2021 1225 94 %     Weight 11/04/2021 1441 87 lb 1.3 oz (39.5 kg)     Height 10/25/2021 1441 5\' 5"  (1.651 m)     Head Circumference --      Peak Flow --      Pain Score 10/26/2021 1219 4     Pain Loc --      Pain Edu? --      Excl. in [place]? --     Most recent vital signs: Vitals:   11/22/2021 2130 10/31/2021 2300  BP: (!) 190/69 (!) 183/71  Pulse: (!) 122 (!) 134  Resp:  (!) 34  Temp:    SpO2: 94% 94%     Physical Exam Constitutional:      General: She is not in acute distress.    Appearance: Normal appearance. She is not ill-appearing.  Pulmonary:     Effort: Pulmonary effort is normal. No respiratory distress.     Breath sounds: Normal breath sounds. No wheezing or rhonchi.  Abdominal:     General: Abdomen is flat.     Palpations: Abdomen is soft.      Tenderness: There is no abdominal tenderness.  Musculoskeletal:     Comments: 2+ pitting edema appreciated in the feet bilaterally.  Pulse, motor, sensation intact bilaterally.  No bony tenderness.  Skin:    General: Skin is warm and dry.     Capillary Refill: Capillary refill takes less than 2 seconds.  Neurological:     Mental Status: She is alert. Mental status is at baseline.      ED Results / Procedures / Treatments  Labs (all labs ordered are listed, but only abnormal results are displayed) Labs Reviewed  CBC WITH DIFFERENTIAL/PLATELET - Abnormal; Notable for the following components:      Result Value   WBC 3.4 (*)    RBC 3.66 (*)    Hemoglobin 9.3 (*)    HCT 29.8 (*)    MCH 25.4 (*)    Lymphs Abs 0.5 (*)    All other components within normal limits  BRAIN NATRIURETIC PEPTIDE - Abnormal; Notable for the following components:   B Natriuretic Peptide 1,040.1 (*)    All other  components within normal limits  COMPREHENSIVE METABOLIC PANEL - Abnormal; Notable for the following components:   Total Protein 6.4 (*)    Albumin 3.1 (*)    All other components within normal limits  URINALYSIS, ROUTINE W REFLEX MICROSCOPIC - Abnormal; Notable for the following components:   Color, Urine YELLOW (*)    APPearance CLEAR (*)    Ketones, ur 20 (*)    All other components within normal limits  MAGNESIUM - Abnormal; Notable for the following components:   Magnesium 1.3 (*)    All other components within normal limits  TROPONIN I (HIGH SENSITIVITY) - Abnormal; Notable for the following components:   Troponin I (High Sensitivity) 31 (*)    All other components within normal limits  TROPONIN I (HIGH SENSITIVITY) - Abnormal; Notable for the following components:   Troponin I (High Sensitivity) 32 (*)    All other components within normal limits  RESP PANEL BY RT-PCR (FLU A&B, COVID) [id number]     EKG Sinus tachycardia, rate 125, no ST segment changes, no AV blocks, no axis  deviations.   RADIOLOGY I personally viewed and evaluated these images as part of my medical decision making, as well as reviewing the written report by the radiologist.  ED Provider Interpretation: I agree with the interpretation of the radiologist.  Mild interstitial pulmonary edema bilaterally.  DG Chest 1 View  Result Date: 10/30/2021 CLINICAL DATA:  Weakness EXAM: CHEST  1 VIEW COMPARISON:  09/28/2021 FINDINGS: Heart size is normal. Moderate to high-grade calcifications are again seen within the aortic arch. There is flattening of the diaphragms and moderate to high-grade hyperinflation, unchanged. There is new mild bilateral diffuse interstitial thickening. No focal airspace consolidation. Unchanged calcifications overlying the bilateral lung apices, seen to correspond to vascular calcifications of the bilateral subclavian arteries on prior 10/11/2021 CT. No pleural effusion or pneumothorax. Moderate multilevel degenerative disc changes of the thoracic spine. Moderate thoracolumbar dextrocurvature with moderate left sided disc space narrowing. IMPRESSION:: IMPRESSION: 1. Chronic moderate to high-grade hyperinflation compatible with COPD. 2. Increased mild bilateral interstitial thickening compatible with mild interstitial pulmonary edema. Electronically Signed   By: Yvonne Kendall   On: 10/27/2021 16:52    PROCEDURES:  Critical Care performed: None.  Procedures    MEDICATIONS ORDERED IN ED: Medications  furosemide (LASIX) injection 60 mg (60 mg Intravenous Given 10/24/2021 2034)  nitroGLYCERIN (NITROGLYN) 2 % ointment 0.5 inch (0.5 inches Topical Given 11/21/2021 2135)  metoprolol tartrate (LOPRESSOR) injection 5 mg (5 mg Intravenous Given 11/23/2021 2357)     IMPRESSION / MDM / ASSESSMENT AND PLAN / ED COURSE  I reviewed the triage vital signs and the nursing notes.                              Kathryn Ware is a 81 y.o. female, diabetes, hypertension, mild cognitive impairment, CHF, PAD,  and lung cancer, presents the emergency department for evaluation of bilateral foot swelling.  Patient states that over the past 3 days she has noticed a gradual swelling in both of her feet.   Differential diagnosis includes, but is not limited to, CHF, renal failure, foot/ankle injury, bilateral DVTs, PAD.  Patient appears well.  NAD.  2+ pitting edema appreciated in the feet bilaterally.  Physical exam is otherwise unremarkable.  Lung sounds are clear bilaterally.  She is tachycardic at 119, otherwise unremarkable vital signs.  CBC unremarkable for leukocytosis.  Mild anemia at  9.3, consistent with her baseline.  Notable BNP at 1040.1.  Troponin elevated at 32, however this is consistent with her previous results.  CMP is unremarkable, though does have mildly decreased albumin.  Urinalysis shows elevated ketones.  Chest x-ray shows mild interstitial pulmonary edema bilaterally.  She is currently feeling well, however she is tachycardic now at 131 and hypertensive at 186/64.  Given her pulmonary edema, hypertension, and bilateral ankle swelling, I suspect that the patient is likely having a heart failure exacerbation.  Low suspicion for acute coronary process.  We will go ahead and initiate furosemide and establish a urinary catheter.  Spoke with the patient about the need for admission , who is amenable to the idea.  She is currently stable at this time and resting comfortably.  Spoke with hospitalist Dr. Damita Dunnings who accepted admission at this time.  The patient's sister, Freda Munro, was also notified      FINAL CLINICAL IMPRESSION(S) / ED DIAGNOSES   Final diagnoses:  Bilateral lower extremity edema     Rx / DC Orders   ED Discharge Orders     None        Note:  This document was prepared using Dragon voice recognition software and may include unintentional dictation errors.   Teodoro Spray, [place] 11/02/21 [place]    Duffy Bruce, MD 11/02/21 [phone]

## 2021-11-01 NOTE — ED Notes (Signed)
Pt had a iron infusion last Thursday and has since had swelling bilateral feet with right being worse. Pt denies pain and denies shob.

## 2021-11-01 NOTE — Telephone Encounter (Signed)
Has only been triaged. Will await the full evaluation

## 2021-11-01 NOTE — Telephone Encounter (Signed)
Patient is currently at the ED.

## 2021-11-01 NOTE — Telephone Encounter (Signed)
PLEASE NOTE: All timestamps contained within this report are represented as [place] Standard Time. CONFIDENTIALTY NOTICE: This fax transmission is intended only for the addressee. It contains information that is legally privileged, confidential or otherwise protected from use or disclosure. If you are not the intended recipient, you are strictly prohibited from reviewing, disclosing, copying using or disseminating any of this information or taking any action in reliance on or regarding this information. If you have received this fax in error, please notify us immediately by telephone so that we can arrange for its return to [place]. Phone: [phone], Toll-Free: [phone], Fax: [phone] Page: 1 of 2 Call Id: [id number] [place] [facility] - Client TELEPHONE ADVICE RECORD [facility] Patient Name: Kathryn Ware [place] Gender: Female DOB: 10/24/1941 Age: 81 Y 22 M 19 D Return Phone Number: [phone] (Primary), [phone] (Secondary) Address: City/ State/ Zip: [place] [address] Client [facility] - Client Client Site [place] - Day Provider Viviana Simpler- MD Contact Type Call Who Is Calling Patient / Member / Family / Caregiver Call Type Triage / Clinical Relationship To Patient Self Return Phone Number [phone] (Primary) Chief Complaint Feet swelling Reason for Call Symptomatic / Request for [place] states she is calling from Dr. Silvio Pate office and needs a Pt to see a nurse. Pt both feet are swollen Translation No Nurse Assessment Nurse: Lucky Cowboy, RN, Levada Dy Date/Time (Eastern Time): 10/28/2021 10:38:25 AM Confirm and document reason for call. If symptomatic, describe symptoms. ---Caller stated that her feet are swollen. She stated that they've been this way for about a week. She had a procedure at some point and thinks this is related. I'm having trouble hearing her despite  asking her to speak up and clearer multiple times. She doesn't know why she had this or what it was. She is alone. Does the patient have any new or worsening symptoms? ---Yes Will a triage be completed? ---Yes Related visit to physician within the last 2 weeks? ---Yes Does the PT have any chronic conditions? (i.e. diabetes, asthma, this includes High risk factors for pregnancy, etc.) ---Yes List chronic conditions. ---diabetes, HTN, refused to answer further Is this a behavioral health or substance abuse call? ---No Guidelines Guideline Title Affirmed Question Affirmed Notes Nurse Date/Time Eilene Ghazi Time) Leg Swelling and Edema [1] MILD swelling of both ankles (i.e., pedal edema) AND [2] new-onset or worsening Lucky Cowboy, RN, Levada Dy 11/02/2021 10:42:29 AM PLEASE NOTE: All timestamps contained within this report are represented as [place] Standard Time. CONFIDENTIALTY NOTICE: This fax transmission is intended only for the addressee. It contains information that is legally privileged, confidential or otherwise protected from use or disclosure. If you are not the intended recipient, you are strictly prohibited from reviewing, disclosing, copying using or disseminating any of this information or taking any action in reliance on or regarding this information. If you have received this fax in error, please notify us immediately by telephone so that we can arrange for its return to [place]. Phone: [phone], Toll-Free: [phone], Fax: [phone] Page: 2 of 2 Call Id: [id number] [place]. Time Eilene Ghazi Time) Disposition Final User 11/21/2021 10:45:31 AM SEE PCP WITHIN 3 DAYS Yes Dew, RN, Marin Shutter Disagree/Comply Comply Caller Understands Yes PreDisposition Call Doctor Care Advice Given Per Guideline SEE PCP WITHIN 3 DAYS: * You need to be seen within 2 or 3 days. * PCP VISIT: Call your doctor (or NP/PA) during regular office hours and make an appointment. A clinic or  urgent care center are  good places to go for care if your doctor's office is closed or you can't get an appointment. NOTE: If office will be open tomorrow, tell caller to call then, not in 3 days. LEG SWELLING OR EDEMA: * Elevate your legs or try to lie down one or two times a day for 20 minutes. * Avoid socks with an elastic band at the top. * Wear comfortable shoes. CALL BACK IF: * Swelling becomes worse * Swelling becomes red or painful to the touch * Calf pain occurs and becomes constant * You become worse CARE ADVICE given per Leg Swelling and Edema (Adult) guideline. Comments User: Raford Pitcher, RN Date/Time Eilene Ghazi Time): 10/31/2021 10:46:12 AM Transferred caller back to the office for app't. Referrals REFERRED TO PCP OFFICE

## 2021-11-01 NOTE — ED Notes (Signed)
See triage note    presents with bilateral foot swelling  no swelling noted to lower ext   denies any chest pain or SOB

## 2021-11-02 ENCOUNTER — Inpatient Hospital Stay: Payer: Medicare Other

## 2021-11-02 ENCOUNTER — Inpatient Hospital Stay
Admit: 2021-11-02 | Discharge: 2021-11-02 | Disposition: A | Discharge status: Home / Self Care | Payer: Medicare Other | Attending: Internal Medicine | Admitting: Internal Medicine

## 2021-11-02 DIAGNOSIS — I5033 Acute on chronic diastolic (congestive) heart failure: Secondary | ICD-10-CM | POA: Diagnosis not present

## 2021-11-02 LAB — CBC WITH DIFFERENTIAL/PLATELET
Abs Immature Granulocytes: 0.01 10*3/uL (ref 0.00–0.07)
Basophils Absolute: 0 10*3/uL (ref 0.0–0.1)
Basophils Relative: 0 %
Eosinophils Absolute: 0 10*3/uL (ref 0.0–0.5)
Eosinophils Relative: 0 %
HCT: 33.1 % — ABNORMAL LOW (ref 36.0–46.0)
Hemoglobin: 10.3 g/dL — ABNORMAL LOW (ref 12.0–15.0)
Immature Granulocytes: 0 %
Lymphocytes Relative: 11 %
Lymphs Abs: 0.3 10*3/uL — ABNORMAL LOW (ref 0.7–4.0)
MCH: 25.1 pg — ABNORMAL LOW (ref 26.0–34.0)
MCHC: 31.1 g/dL (ref 30.0–36.0)
MCV: 80.7 fL (ref 80.0–100.0)
Monocytes Absolute: 0.3 10*3/uL (ref 0.1–1.0)
Monocytes Relative: 9 %
Neutro Abs: 2.3 10*3/uL (ref 1.7–7.7)
Neutrophils Relative %: 80 %
Platelets: 267 10*3/uL (ref 150–400)
RBC: 4.1 MIL/uL (ref 3.87–5.11)
RDW: 14.8 % (ref 11.5–15.5)
WBC: 2.9 10*3/uL — ABNORMAL LOW (ref 4.0–10.5)
nRBC: 0 % (ref 0.0–0.2)

## 2021-11-02 LAB — CBG MONITORING, ED
Glucose-Capillary: 76 mg/dL (ref 70–99)
Glucose-Capillary: 326 mg/dL — ABNORMAL HIGH (ref 70–99)
Glucose-Capillary: 273 mg/dL — ABNORMAL HIGH (ref 70–99)
Glucose-Capillary: 101 mg/dL — ABNORMAL HIGH (ref 70–99)

## 2021-11-02 LAB — ECHOCARDIOGRAM COMPLETE
AR max vel: 3.08 cm2
AV Area VTI: 3.26 cm2
AV Area mean vel: 3.01 cm2
AV Mean grad: 5 mmHg
AV Peak grad: 8.2 mmHg
Ao pk vel: 1.43 m/s
Area-P 1/2: 6.6 cm2
Height: 65 in
MV VTI: 3.23 cm2
S' Lateral: 2.05 cm
Weight: 1393.31 oz

## 2021-11-02 LAB — HEMOGLOBIN A1C
Hgb A1c MFr Bld: 5.5 % (ref 4.8–5.6)
Mean Plasma Glucose: 111.15 mg/dL

## 2021-11-02 LAB — BASIC METABOLIC PANEL
Anion gap: 18 — ABNORMAL HIGH (ref 5–15)
BUN: 25 mg/dL — ABNORMAL HIGH (ref 8–23)
CO2: 24 mmol/L (ref 22–32)
Calcium: 10.5 mg/dL — ABNORMAL HIGH (ref 8.9–10.3)
Chloride: 100 mmol/L (ref 98–111)
Creatinine, Ser: 0.78 mg/dL (ref 0.44–1.00)
GFR, Estimated: >60 mL/min (ref >60)
Glucose, Bld: 73 mg/dL (ref 70–99)
Potassium: 3.5 mmol/L (ref 3.5–5.1)
Sodium: 142 mmol/L (ref 135–145)

## 2021-11-02 LAB — TSH: TSH: <0.01 u[IU]/mL — ABNORMAL LOW (ref 0.350–4.500)

## 2021-11-02 LAB — D-DIMER, QUANTITATIVE: D-Dimer, Quant: 1.36 ug/mL-FEU — ABNORMAL HIGH (ref 0.00–0.50)

## 2021-11-02 LAB — T4, FREE: Free T4: >5.5 ng/dL — ABNORMAL HIGH (ref 0.61–1.12)

## 2021-11-02 MED ORDER — METOPROLOL SUCCINATE ER 50 MG PO TB24
25.0000 mg | ORAL_TABLET | Freq: Every day | ORAL | Status: DC
  Administered 2021-11-02: 25 mg via ORAL
  Filled 2021-11-02: qty 1

## 2021-11-02 MED ORDER — PROPRANOLOL HCL 20 MG PO TABS
40.0000 mg | ORAL_TABLET | Freq: Two times a day (BID) | ORAL | Status: DC
  Administered 2021-11-02 – 2021-11-05 (×7): 40 mg via ORAL
  Filled 2021-11-02: qty 2
  Filled 2021-11-02 (×2): qty 1
  Filled 2021-11-02 (×2): qty 2
  Filled 2021-11-02 (×2): qty 1
  Filled 2021-11-02 (×2): qty 2

## 2021-11-02 MED ORDER — INSULIN ASPART 100 UNIT/ML IJ SOLN
0.0000 [IU] | Freq: Every day | INTRAMUSCULAR | Status: DC
  Administered 2021-11-05: 2 [IU] via SUBCUTANEOUS
  Filled 2021-11-02: qty 1

## 2021-11-02 MED ORDER — NITROGLYCERIN 2 % TD OINT
1.0000 [in_us] | TOPICAL_OINTMENT | Freq: Four times a day (QID) | TRANSDERMAL | Status: DC
  Administered 2021-11-02 – 2021-11-05 (×11): 1 [in_us] via TOPICAL
  Filled 2021-11-02 (×11): qty 1

## 2021-11-02 MED ORDER — CLONAZEPAM 0.5 MG PO TABS
0.5000 mg | ORAL_TABLET | Freq: Once | ORAL | Status: AC
Start: 2021-11-02 — End: 2021-11-02
  Administered 2021-11-02: 0.5 mg via ORAL
  Filled 2021-11-02: qty 1

## 2021-11-02 MED ORDER — LISINOPRIL 10 MG PO TABS
10.0000 mg | ORAL_TABLET | Freq: Every day | ORAL | Status: DC
Start: 2021-11-02 — End: 2021-11-04
  Administered 2021-11-02 – 2021-11-04 (×3): 10 mg via ORAL
  Filled 2021-11-02 (×3): qty 1

## 2021-11-02 MED ORDER — SODIUM CHLORIDE 0.9 % IV SOLN
250.0000 mL | INTRAVENOUS | Status: DC | PRN
  Administered 2021-11-04: 250 mL via INTRAVENOUS

## 2021-11-02 MED ORDER — ACETAMINOPHEN 325 MG PO TABS: 650.0000 mg | ORAL_TABLET | ORAL | Status: DC | PRN

## 2021-11-02 MED ORDER — SODIUM CHLORIDE 0.9% FLUSH
3.0000 mL | INTRAVENOUS | Status: DC | PRN
Start: 2021-11-02 — End: 2021-11-06

## 2021-11-02 MED ORDER — MAGNESIUM SULFATE 2 GM/50ML IV SOLN
2.0000 g | Freq: Once | INTRAVENOUS | Status: AC
  Administered 2021-11-02: 2 g via INTRAVENOUS
  Filled 2021-11-02: qty 50

## 2021-11-02 MED ORDER — SODIUM CHLORIDE 0.9% FLUSH
3.0000 mL | Freq: Two times a day (BID) | INTRAVENOUS | Status: DC
  Administered 2021-11-02 – 2021-11-05 (×8): 3 mL via INTRAVENOUS

## 2021-11-02 MED ORDER — ENSURE ENLIVE PO LIQD
237.0000 mL | Freq: Three times a day (TID) | ORAL | Status: DC
  Administered 2021-11-02 – 2021-11-04 (×6): 237 mL via ORAL

## 2021-11-02 MED ORDER — MAGNESIUM OXIDE -MG SUPPLEMENT 400 (240 MG) MG PO TABS
400.0000 mg | ORAL_TABLET | Freq: Two times a day (BID) | ORAL | Status: DC
  Administered 2021-11-02 – 2021-11-03 (×3): 400 mg via ORAL
  Filled 2021-11-02 (×3): qty 1

## 2021-11-02 MED ORDER — ADULT MULTIVITAMIN W/MINERALS CH
1.0000 | ORAL_TABLET | Freq: Every day | ORAL | Status: DC
  Administered 2021-11-02 – 2021-11-05 (×4): 1 via ORAL
  Filled 2021-11-02 (×3): qty 1

## 2021-11-02 MED ORDER — FUROSEMIDE 10 MG/ML IJ SOLN
20.0000 mg | Freq: Two times a day (BID) | INTRAMUSCULAR | Status: DC
  Administered 2021-11-02: 20 mg via INTRAVENOUS
  Filled 2021-11-02: qty 4

## 2021-11-02 MED ORDER — CLONIDINE HCL 0.1 MG PO TABS: 0.2000 mg | ORAL_TABLET | Freq: Once | ORAL | Status: DC

## 2021-11-02 MED ORDER — METHIMAZOLE 5 MG PO TABS
5.0000 mg | ORAL_TABLET | Freq: Every day | ORAL | Status: DC
  Administered 2021-11-02 – 2021-11-05 (×4): 5 mg via ORAL
  Filled 2021-11-02 (×4): qty 1

## 2021-11-02 MED ORDER — INSULIN ASPART 100 UNIT/ML IJ SOLN
0.0000 [IU] | Freq: Three times a day (TID) | INTRAMUSCULAR | Status: DC
  Administered 2021-11-02: 5 [IU] via SUBCUTANEOUS
  Administered 2021-11-02: 7 [IU] via SUBCUTANEOUS
  Administered 2021-11-03: 3 [IU] via SUBCUTANEOUS
  Administered 2021-11-03: 1 [IU] via SUBCUTANEOUS
  Administered 2021-11-03 – 2021-11-04 (×2): 2 [IU] via SUBCUTANEOUS
  Administered 2021-11-04: 3 [IU] via SUBCUTANEOUS
  Administered 2021-11-05: 1 [IU] via SUBCUTANEOUS
  Administered 2021-11-05: 2 [IU] via SUBCUTANEOUS
  Administered 2021-11-05: 1 [IU] via SUBCUTANEOUS
  Filled 2021-11-02 (×9): qty 1

## 2021-11-02 MED ORDER — ONDANSETRON HCL 4 MG/2ML IJ SOLN: 4.0000 mg | Freq: Four times a day (QID) | INTRAMUSCULAR | Status: DC | PRN

## 2021-11-02 NOTE — Progress Notes (Signed)
PROGRESS NOTE    Kathryn Ware  GYI:[id number] DOB: 21-Nov-1940 DOA: 10/25/2021 PCP: Venia Carbon, MD    Brief Narrative:  81 year old female with history of type 2 diabetes on metformin, hypertension, BMs and chronic blood loss anemia on weekly iron transfusions by hematology, stage I non-small cell lung cancer completed radiation treatment presented to the ER with bilateral lower lower extremity weakness, feeling like her legs are giving out and also had some swelling in the ankles that is improved now.  In the emergency room blood pressure 200/72, heart rate 120-160.  On room air.  Hemoglobin 9.3.  Magnesium 1.3.  EKG with sinus tachycardia.  Chest x-ray with increased mild bilateral interstitial thickening, hyperinflation consistent with COPD. Patient has been following up as outpatient for progressive weight loss for about a year now.   Assessment & Plan:   Hypertensive urgency: Presented with blood pressure 200/72, treated with home medications including lisinopril.  Blood pressures are adequately controlled.  Will gradually achieve normotension.  Adding propanolol per below.  Clinical hyperthyroidism, moderate symptoms:  Bilateral lower extremity weakness with no neurological deficits. Progressive weight loss and cachexia, chronic and persistent. Persistent sinus tachycardia The symptomatology consistent with clinical hyperthyroidism.  No evidence of Graves' ophthalmopathy, no palpable goiter. TSH is <0.010 Free T4 > 5.5 Free T3 pending. With persistent sinus tachycardia, will start patient on propanolol 40 mg twice daily, can go up on the doses depends upon the heart rate control.  We will start patient on methimazole 5 mg twice daily(no contraindication.) Sent referral to endocrinology for follow-up. Will check antithyroid antibody and thyroid ultrasound when she is in the hospital. Recently underwent CT scan of the chest abdomen pelvis for unexplained weight loss and were  essentially normal.  Known stage I lung cancer left lung and is stable. Parotid biopsy recently done was benign.  Chronic blood loss anemia: Stable hemoglobin.  Known GI AVMs.  Hemoglobin is stable.  Underweight, protein calorie malnutrition.  To be seen by nutrition. Nutrition Status: Nutrition Problem: Increased nutrient needs Etiology: chronic illness (CHF) Signs/Symptoms: estimated needs Interventions: Ensure Enlive (each supplement provides 350kcal and 20 grams of protein), MVI   Type 2 diabetes with hyperlipidemia: On metformin at home.  A1c is 5.5.  Currently on sliding scale insulin.  Resume metformin on discharge.  History of lung cancer: Stable and treated with radiation.  Hypomagnesemia: 1.3.  Not replaced.  Replace with IV and oral.  Recheck tomorrow morning.  Suspected congestive heart failure: Rule out.  BNP more than thousand.  Patient had ankle swelling on presentation.  Given dose of Lasix.  Patient looks euvolemic today.  Echocardiogram pending, will discontinue further Lasix.   DVT prophylaxis: SCDs Start: 11/02/21 0448   Code Status: Full code Family Communication: Sister on the phone Disposition Plan: Status is: Inpatient  Remains inpatient appropriate because: Persistent tachycardia.         Consultants:  None  Procedures:  None  Antimicrobials:  None   Subjective: Patient seen and examined.  Poor historian.  Denies any chest pain or palpitations.  She tells me she came to the ER because of ankle swelling that has improved now.  Denies any shortness of breath.  Patient tells me that she keeps losing weight.  Appetite is not good.  Denies any visual changes.  Objective: Vitals:   11/02/21 0730 11/02/21 0800 11/02/21 1100 11/02/21 1300  BP: (!) 159/62 (!) 151/139 (!) 166/60 (!) 152/65  Pulse: (!) 147 (!) 148 (!) 121 Marland Kitchen)  128  Resp: (!) 29 17 (!) 25   Temp:  98.1 F (36.7 C) 98.7 F (37.1 C) 98.8 F (37.1 C)  TempSrc:  Oral Oral Oral   SpO2: 96% 98% 92% 94%  Weight:      Height:       No intake or output data in the 24 hours ending 11/02/21 1314 Filed Weights   10/31/2021 1441  Weight: 39.5 kg    Examination:  General exam: Appears calm and comfortable  Thinly built.  Underweight.  Not in any distress.  On room air. Eyes without any proptosis. Respiratory system: Clear to auscultation. Respiratory effort normal. Cardiovascular system: S1 & S2 heard, RRR.  Tachycardia.  No peripheral edema. Gastrointestinal system: Abdomen is nondistended, soft and nontender. No organomegaly or masses felt. Normal bowel sounds heard. Central nervous system: Alert and oriented. No focal neurological deficits. Extremities: Symmetric 5 x 5 power. Skin: No rashes, lesions or ulcers Psychiatry: Judgement and insight appear normal. Mood & affect appropriate.     Data Reviewed: I have personally reviewed following labs and imaging studies  CBC: Recent Labs  Lab 11/22/2021 1655 11/02/21 0614  WBC 3.4* 2.9*  NEUTROABS 2.1 2.3  HGB 9.3* 10.3*  HCT 29.8* 33.1*  MCV 81.4 80.7  PLT 226 161   Basic Metabolic Panel: Recent Labs  Lab 11/10/2021 1655 11/02/21 0614  NA 139 142  K 3.6 3.5  CL 101 100  CO2 26 24  GLUCOSE 70 73  BUN 19 25*  CREATININE 0.63 0.78  CALCIUM 9.7 10.5*  MG 1.3*  --    GFR: Estimated Creatinine Clearance: 35.6 mL/min (by C-G formula based on SCr of 0.78 mg/dL). Liver Function Tests: Recent Labs  Lab 10/28/2021 1655  AST 29  ALT 35  ALKPHOS 57  BILITOT 0.5  PROT 6.4*  ALBUMIN 3.1*   No results for input(s): LIPASE, AMYLASE in the last 168 hours. No results for input(s): AMMONIA in the last 168 hours. Coagulation Profile: No results for input(s): INR, PROTIME in the last 168 hours. Cardiac Enzymes: No results for input(s): CKTOTAL, CKMB, CKMBINDEX, TROPONINI in the last 168 hours. BNP (last 3 results) No results for input(s): PROBNP in the last 8760 hours. HbA1C: Recent Labs     11/02/21 0614  HGBA1C 5.5   CBG: Recent Labs  Lab 11/02/21 0745 11/02/21 1142  GLUCAP 76 326*   Lipid Profile: No results for input(s): CHOL, HDL, LDLCALC, TRIG, CHOLHDL, LDLDIRECT in the last 72 hours. Thyroid Function Tests: Recent Labs    11/02/21 0614  TSH <0.010*  FREET4 >5.50*   Anemia Panel: No results for input(s): VITAMINB12, FOLATE, FERRITIN, TIBC, IRON, RETICCTPCT in the last 72 hours. Sepsis Labs: No results for input(s): PROCALCITON, LATICACIDVEN in the last 168 hours.  Recent Results (from the past 240 hour(s))  Resp Panel by RT-PCR (Flu A&B, Covid) Nasopharyngeal Swab     Status: None   Collection Time: 11/23/2021  9:07 PM   Specimen: Nasopharyngeal Swab; Nasopharyngeal(NP) swabs in vial transport medium  Result Value Ref Range Status   SARS Coronavirus 2 by RT PCR NEGATIVE NEGATIVE Final    Comment: (NOTE) SARS-CoV-2 target nucleic acids are NOT DETECTED.  The SARS-CoV-2 RNA is generally detectable in upper respiratory specimens during the acute phase of infection. The lowest concentration of SARS-CoV-2 viral copies this assay can detect is 138 copies/mL. A negative result does not preclude SARS-Cov-2 infection and should not be used as the sole basis for treatment or other patient  management decisions. A negative result may occur with  improper specimen collection/handling, submission of specimen other than nasopharyngeal swab, presence of viral mutation(s) within the areas targeted by this assay, and inadequate number of viral copies(<138 copies/mL). A negative result must be combined with clinical observations, patient history, and epidemiological information. The expected result is Negative.  Fact Sheet for Patients:  EntrepreneurPulse.com.au  Fact Sheet for Healthcare Providers:  IncredibleEmployment.be  This test is no t yet approved or cleared by the [place] FDA and  has been authorized for detection  and/or diagnosis of SARS-CoV-2 by FDA under an Emergency Use Authorization (EUA). This EUA will remain  in effect (meaning this test can be used) for the duration of the COVID-19 declaration under Section 564(b)(1) of the Act, 21 U.S.C.section 360bbb-3(b)(1), unless the authorization is terminated  or revoked sooner.       Influenza A by PCR NEGATIVE NEGATIVE Final   Influenza B by PCR NEGATIVE NEGATIVE Final    Comment: (NOTE) The Xpert Xpress SARS-CoV-2/FLU/RSV plus assay is intended as an aid in the diagnosis of influenza from Nasopharyngeal swab specimens and should not be used as a sole basis for treatment. Nasal washings and aspirates are unacceptable for Xpert Xpress SARS-CoV-2/FLU/RSV testing.  Fact Sheet for Patients: EntrepreneurPulse.com.au  Fact Sheet for Healthcare Providers: IncredibleEmployment.be  This test is not yet approved or cleared by the [place] FDA and has been authorized for detection and/or diagnosis of SARS-CoV-2 by FDA under an Emergency Use Authorization (EUA). This EUA will remain in effect (meaning this test can be used) for the duration of the COVID-19 declaration under Section 564(b)(1) of the Act, 21 U.S.C. section 360bbb-3(b)(1), unless the authorization is terminated or revoked.  Performed at [facility], [address], [place], [place] [address]          Radiology Studies: DG Chest 1 View  Result Date: 11/03/2021 CLINICAL DATA:  Weakness EXAM: CHEST  1 VIEW COMPARISON:  09/28/2021 FINDINGS: Heart size is normal. Moderate to high-grade calcifications are again seen within the aortic arch. There is flattening of the diaphragms and moderate to high-grade hyperinflation, unchanged. There is new mild bilateral diffuse interstitial thickening. No focal airspace consolidation. Unchanged calcifications overlying the bilateral lung apices, seen to correspond to vascular calcifications of the  bilateral subclavian arteries on prior 10/11/2021 CT. No pleural effusion or pneumothorax. Moderate multilevel degenerative disc changes of the thoracic spine. Moderate thoracolumbar dextrocurvature with moderate left sided disc space narrowing. IMPRESSION:: IMPRESSION: 1. Chronic moderate to high-grade hyperinflation compatible with COPD. 2. Increased mild bilateral interstitial thickening compatible with mild interstitial pulmonary edema. Electronically Signed   By: Yvonne Kendall   On: 11/22/2021 16:52   CT Head Wo Contrast  Result Date: 11/02/2021 CLINICAL DATA:  Fall several times.  Stroke suspected EXAM: CT HEAD WITHOUT CONTRAST TECHNIQUE: Contiguous axial images were obtained from the base of the skull through the vertex without intravenous contrast. COMPARISON:  01/05/2020 FINDINGS: Brain: No evidence of acute infarction, hemorrhage, hydrocephalus, extra-axial collection or mass lesion/mass effect. Interval but well-defined low-density at the left thalamus. Cerebral volume loss in keeping with age. Vascular: No hyperdense vessel or unexpected calcification. Skull: Normal. Negative for fracture or focal lesion. Sinuses/Orbits: No acute finding. IMPRESSION: 1. No acute finding. 2. Interval but chronic appearing lacunar infarct at the left thalamus when compared to 2021. Electronically Signed   By: Jorje Guild M.D.   On: 11/02/2021 05:02   US Venous Img Lower Bilateral (DVT)  Result Date:  11/02/2021 CLINICAL DATA:  Bilateral lower extremity edema. EXAM: BILATERAL LOWER EXTREMITY VENOUS DOPPLER ULTRASOUND TECHNIQUE: Gray-scale sonography with compression, as well as color and duplex ultrasound, were performed to evaluate the deep venous system(s) from the level of the common femoral vein through the popliteal and proximal calf veins. COMPARISON:  None. FINDINGS: VENOUS Normal compressibility of the common femoral, superficial femoral, and popliteal veins, as well as the visualized calf veins.  Visualized portions of profunda femoral vein and great saphenous vein unremarkable. No filling defects to suggest DVT on grayscale or color Doppler imaging. Doppler waveforms show normal direction of venous flow, normal respiratory plasticity and response to augmentation. Limited views of the contralateral common femoral vein are unremarkable. OTHER None. Limitations: none IMPRESSION: Negative. Electronically Signed   By: Telford Nab M.D.   On: 11/02/2021 00:27        Scheduled Meds:  feeding supplement  237 mL Oral TID BM   insulin aspart  0-5 Units Subcutaneous QHS   insulin aspart  0-9 Units Subcutaneous TID WC   lisinopril  10 mg Oral Daily   magnesium oxide  400 mg Oral BID   methimazole  5 mg Oral Daily   multivitamin with minerals  1 tablet Oral Daily   nitroGLYCERIN  1 inch Topical Q6H   propranolol  40 mg Oral BID   sodium chloride flush  3 mL Intravenous Q12H   Continuous Infusions:  sodium chloride     magnesium sulfate bolus IVPB       LOS: 1 day    Time spent: 45 minutes    Barb Merino, MD [facility] Pager [phone]

## 2021-11-02 NOTE — Progress Notes (Signed)
Initial Nutrition Assessment  DOCUMENTATION CODES:   Underweight  INTERVENTION:   -Liberalize diet to 2 gram sodium -Ensure Enlive po TID, each supplement provides 350 kcal and 20 grams of protein  -MVI with minerals daily  NUTRITION DIAGNOSIS:   Increased nutrient needs related to chronic illness (CHF) as evidenced by estimated needs.  GOAL:   Patient will meet greater than or equal to 90% of their needs  MONITOR:   PO intake, Supplement acceptance, Labs, Weight trends, Skin, I & O's  REASON FOR ASSESSMENT:   Consult Assessment of nutrition requirement/status  ASSESSMENT:   Kathryn Ware is a 81 y.o. female with medical history significant for DM, HTN, gastric AVMs with history of chronic blood loss anemia, on weekly iron transfusions by hematology, mild cognitive deficit, stage I NSCLC completed XRT 01/12/2021, who presents to the ED for evaluation of lower extremity weakness, feeling like her legs giving out, making it difficult to ambulate, causing her to fall a couple times but without injury.  She denies shortness of breath, denies orthopnea, chest pain, and denies cough fever or chills.  Pt admitted with bilateral lower extremity weakness and hypertensive urgency.   Pt unavailable at time of visit. RD unable to obtain further nutrition-related history or complete nutrition-focused physical exam at this time.    Pt currently on a heart healthy diet. No meal completions available to assess at this time. Heart Healthy diet is a restrictive diet which restricts fat, sodium, and cholesterol. Dur to pt's underweight status and suspect malnutrition, RD will liberalize diet to 2 gram sodium (which restricts sodium due to history of CHF) and provide increased food choices to help promote adequate oral intake.   Reviewed wt hx; pt has experienced a 7.9% wt loss over the past 6 months. While this is not significant for time frame, it is concerning given advanced age, multiple  co-morbidities, and underweight status. Highly suspect pt with malnutrition, however, RD unable to identify a this time.    Labs reviewed. Inpatient orders for glycemic control are 0-5 units insulin aspart daily at bedtime and 0-9 units insulin aspart TID with meals.   Diet Order:   Diet Order             Diet Heart Room service appropriate? Yes; Fluid consistency: Thin  Diet effective now                   EDUCATION NEEDS:   No education needs have been identified at this time  Skin:  Skin Assessment: Reviewed RN Assessment  Last BM:  Unknown  Height:   Ht Readings from Last 1 Encounters:  10/30/2021 5\' 5"  (1.651 m)    Weight:   Wt Readings from Last 1 Encounters:  11/19/2021 39.5 kg    Ideal Body Weight:  56.8 kg  BMI:  Body mass index is 14.49 kg/m.  Estimated Nutritional Needs:   Kcal:  1550-1750  Protein:  80-95 grams  Fluid:  > 1.5 L    Loistine Chance, RD, LDN, [place] Registered Dietitian II Certified Diabetes Care and Education Specialist Please refer to [facility] for RD and/or RD on-call/weekend/after hours pager

## 2021-11-02 NOTE — Progress Notes (Signed)
*  PRELIMINARY RESULTS* Echocardiogram 2D Echocardiogram has been performed.  Kathryn Ware 11/02/2021, 2:50 PM

## 2021-11-02 NOTE — ED Notes (Signed)
Took over patient care, md at bedside. Ordered a ct of head without contrast for the weakness that she is reporting in the legs. No complaints at this time from patient

## 2021-11-02 NOTE — Evaluation (Signed)
Occupational Therapy Evaluation Patient Details Name: Kathryn Ware MRN: [id number] DOB: [birth year] Today's Date: 11/02/2021   History of Present Illness 81 y.o. female with medical history significant for DM, HTN, gastric AVMs with history of chronic blood loss anemia, on weekly iron transfusions by hematology, mild cognitive deficit, stage I NSCLC completed XRT 01/12/2021, who presents to the ED for evaluation of lower extremity weakness leading to difficulty with ambulation.   Clinical Impression   Pt seen for OT evaluation this date. Upon arrival to room, pt awake and sitting upright in bed with sister arriving to room. Pt agreeable to OT tx. Prior to admission, pt was independent in all ADLs and functional mobility, living in a 1-story home with sister. Pt currently requires SUPERVISION for bed mobility, SUPERVISION for seated LB dressing, MIN GUARD for functional mobility of short household distances (without AD), and SUPERVISION for standing grooming tasks due to current functional impairments (See OT Problem List below). HR 90-100s throughout session. Pt endorses decreased strength and activity tolerance compared to baseline. Pt would benefit from additional skilled OT services to maximize return to PLOF and minimize risk of future falls, injury, caregiver burden, and readmission. Upon discharge, recommend [place] services.         Recommendations for follow up therapy are one component of a multi-disciplinary discharge planning process, led by the attending physician.  Recommendations may be updated based on patient status, additional functional criteria and insurance authorization.   Follow Up Recommendations  Home health OT    Assistance Recommended at Discharge Intermittent Supervision/Assistance  Patient can return home with the following A little help with walking and/or transfers;A little help with bathing/dressing/bathroom    Functional Status Assessment  Patient has had a recent  decline in their functional status and demonstrates the ability to make significant improvements in function in a reasonable and predictable amount of time.  Equipment Recommendations  None recommended by OT       Precautions / Restrictions Precautions Precautions: Fall Restrictions Weight Bearing Restrictions: No      Mobility Bed Mobility Overal bed mobility: Needs Assistance Bed Mobility: Supine to Sit     Supine to sit: Supervision;HOB elevated    General bed mobility comments: requires increased time and use of bedrails    Transfers Overall transfer level: Needs assistance Equipment used: 1 person hand held assist Transfers: Sit to/from Stand Sit to Stand: Min guard           General transfer comment: CGA to steady d/t pt reporting some dizziness in standing position      Balance Overall balance assessment: Needs assistance Sitting-balance support: Bilateral upper extremity supported;Feet supported Sitting balance-Leahy Scale: Good Sitting balance - Comments: Good sitting balance bending down to don/doff socks and shoes Postural control: Posterior lean Standing balance support: No upper extremity supported;During functional activity Standing balance-Leahy Scale: Fair Standing balance comment: Requires MIN GUARD for functional mobility of short household distances                           ADL either performed or assessed with clinical judgement   ADL Overall ADL's : Needs assistance/impaired     Grooming: Wash/dry hands;Supervision/safety;Standing Grooming Details (indicate cue type and reason): Supervision d/t pt reporting some dizziness while standing             Lower Body Dressing: Supervision/safety;Set up;Sitting/lateral leans Lower Body Dressing Details (indicate cue type and reason): to don/doff socks and shoes  Functional mobility during ADLs: Min guard       Vision Ability to See in Adequate Light: 0  Adequate Patient Visual Report: No change from baseline              Pertinent Vitals/Pain Pain Assessment: No/denies pain        Extremity/Trunk Assessment Upper Extremity Assessment Upper Extremity Assessment: Generalized weakness   Lower Extremity Assessment Lower Extremity Assessment: Generalized weakness       Communication Communication Communication: No difficulties   Cognition Arousal/Alertness: Awake/alert Behavior During Therapy: WFL for tasks assessed/performed Overall Cognitive Status: Within Functional Limits for tasks assessed                                 General Comments: A&Ox3 - difficulty explaining situation     General Comments  HR 90-100s throughout            Home Living Family/patient expects to be discharged to:: Private residence Living Arrangements: Other relatives (sister) Available Help at Discharge: Family;Available 24 hours/day Type of Home: House Home Access: Stairs to enter [employer] of Steps: 2 Entrance Stairs-Rails: None Home Layout: One level     Bathroom Shower/Tub: Tub/shower unit         Home Equipment: Tub bench   Additional Comments: Does not walk with or own any AD      Prior Functioning/Environment Prior Level of Function : Independent/Modified Independent             Mobility Comments: Reports independence with all mobility. Denies any recent falls ADLs Comments: Reports independence with ADLs. Sister assists with IADLs        OT Problem List: Decreased strength;Decreased activity tolerance;Impaired balance (sitting and/or standing)      OT Treatment/Interventions: Self-care/ADL training;Therapeutic exercise;Energy conservation;DME and/or AE instruction;Therapeutic activities;Patient/family education;Balance training    OT Goals(Current goals can be found in the care plan section) Acute Rehab OT Goals Patient Stated Goal: to return home OT Goal Formulation: With  patient/family Time For Goal Achievement: 11/16/21 Potential to Achieve Goals: Good ADL Goals Pt Will Perform Grooming: with modified independence;standing Pt Will Perform Lower Body Dressing: with modified independence;sit to/from stand Pt Will Transfer to Toilet: with modified independence;ambulating;regular height toilet  OT Frequency:         AM-PAC OT "6 Clicks" Daily Activity     Outcome Measure Help from another person eating meals?: None Help from another person taking care of personal grooming?: A Little Help from another person toileting, which includes using toliet, bedpan, or urinal?: A Little Help from another person bathing (including washing, rinsing, drying)?: A Little Help from another person to put on and taking off regular upper body clothing?: None Help from another person to put on and taking off regular lower body clothing?: A Little 6 Click Score: 20   End of Session Nurse Communication: Mobility status  Activity Tolerance: Patient tolerated treatment well Patient left: in bed;with call bell/phone within reach;with family/visitor present  OT Visit Diagnosis: Unsteadiness on feet (R26.81);Muscle weakness (generalized) (M62.81)                Time: 4627-0350 OT Time Calculation (min): 16 min Charges:  OT General Charges $OT Visit: 1 Visit OT Evaluation $OT Eval Moderate Complexity: [place], OTR/L [place]

## 2021-11-02 NOTE — ED Notes (Signed)
Pt's removed IV and tele leads. IV placed, tele reapplied, and mitts applied by this RN.

## 2021-11-02 NOTE — Evaluation (Signed)
Physical Therapy Evaluation Patient Details Name: Kathryn Ware MRN: [id number] DOB: [birth year] Today's Date: 11/02/2021  History of Present Illness  81 y.o. female with medical history significant for DM, HTN, gastric AVMs with history of chronic blood loss anemia, on weekly iron transfusions by hematology, mild cognitive deficit, stage I NSCLC completed XRT 01/12/2021, who presents to the ED for evaluation of lower extremity weakness leading to difficulty with ambulation.   Clinical Impression  Pt received supine in bed, agreeable to therapy. PT received approval from MD via secure chat regarding tachycardia, MD requesting pt remain <160bpm during mobility. Pt lives with her sister in a single level home with 2 STE. 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   Pt performed bed mobility with SUP, STS with CGA for light steadying and ambulation with MIN A requiring increased steadying. PT monitored HR continuously throughout session. Resting HR ranged between 128-135bpm supine in bed and seated at EOB. MAX HR was 150bpm upon completion of 71ft ambulation. RN aware. Pt presents with deficts in strength, endurance, balance and functional mobility. Recommending pt d/c home with constant supervision and HHPT. Pt would benefit from skilled PT to address above deficits and promote optimal return to PLOF.      Recommendations for follow up therapy are one component of a multi-disciplinary discharge planning process, led by the attending physician.  Recommendations may be updated based on patient status, additional functional criteria and insurance authorization.  Follow Up Recommendations Home health PT    Assistance Recommended at Discharge Frequent or constant Supervision/Assistance  Patient can return home with the following  A little help with walking and/or transfers;A little help with bathing/dressing/bathroom;Assistance with cooking/housework;Assist for  transportation;Help with stairs or ramp for entrance    Equipment Recommendations Rolling walker (2 wheels)  Recommendations for Other Services       Functional Status Assessment Patient has had a recent decline in their functional status and demonstrates the ability to make significant improvements in function in a reasonable and predictable amount of time.     Precautions / Restrictions Precautions Precautions: Fall Restrictions Weight Bearing Restrictions: No      Mobility  Bed Mobility Overal bed mobility: Needs Assistance Bed Mobility: Supine to Sit;Sit to Supine     Supine to sit: Supervision;HOB elevated Sit to supine: Supervision;HOB elevated   General bed mobility comments: increased time    Transfers Overall transfer level: Needs assistance Equipment used: 1 person hand held assist Transfers: Sit to/from Stand Sit to Stand: Min guard           General transfer comment: CGA to steady    Ambulation/Gait Ambulation/Gait assistance: Min assist Gait Distance (Feet): 40 Feet Assistive device: 1 person hand held assist;IV Pole Gait Pattern/deviations: Step-through pattern;Trunk flexed Gait velocity: decreased     General Gait Details: MIN A for increased steadying via HHA and vitals monitor. Decreased stepping cadence. HR ranged from 138-150 during ambulation.  Stairs            Wheelchair Mobility    Modified Rankin (Stroke Patients Only)       Balance Overall balance assessment: Needs assistance Sitting-balance support: Bilateral upper extremity supported;Feet supported Sitting balance-Leahy Scale: Fair Sitting balance - Comments: posterior lean during MMT Postural control: Posterior lean Standing balance support: Bilateral upper extremity supported;During functional activity Standing balance-Leahy Scale: Poor Standing balance comment: kyphotic posture; required HHA and hand on vitals monitor to maintain balance during ambulation.  Pertinent Vitals/Pain Pain Assessment: No/denies pain    Home Living Family/patient expects to be discharged to:: Private residence Living Arrangements: Other relatives (sister) Available Help at Discharge: Family;Available 24 hours/day Type of Home: House Home Access: Stairs to enter Entrance Stairs-Rails: None Entrance Stairs-Number of Steps: 2   Home Layout: One level Home Equipment: None Additional Comments: Does not walk with or own any AD    Prior Function Prior Level of Function : Independent/Modified Independent             Mobility Comments: Reports independence with all mobility. ADLs Comments: Sister assists with IADLs     Hand Dominance        Extremity/Trunk Assessment   Upper Extremity Assessment Upper Extremity Assessment: Generalized weakness    Lower Extremity Assessment Lower Extremity Assessment: Generalized weakness (3+/5 in bilateral hip flexors and L DF. 4+/5 bilateral knee extension. 5/5 R DF.)       Communication   Communication: No difficulties  Cognition Arousal/Alertness: Awake/alert Behavior During Therapy: WFL for tasks assessed/performed Overall Cognitive Status: Within Functional Limits for tasks assessed                                 General Comments: A&Ox3 - difficulty explaining situation        General Comments      Exercises     Assessment/Plan    PT Assessment Patient needs continued PT services  PT Problem List Decreased strength;Decreased mobility;Decreased safety awareness;Decreased activity tolerance;Decreased balance;Cardiopulmonary status limiting activity       PT Treatment Interventions DME instruction;Therapeutic exercise;Gait training;Balance training;Stair training;Neuromuscular re-education;Functional mobility training;Therapeutic activities;Patient/family education    PT Goals (Current goals can be found in the Care Plan section)  Acute Rehab PT  Goals Patient Stated Goal: to go home PT Goal Formulation: With patient Time For Goal Achievement: 11/16/21 Potential to Achieve Goals: Fair    Frequency Min 2X/week     Co-evaluation               AM-PAC PT "6 Clicks" Mobility  Outcome Measure Help needed turning from your back to your side while in a flat bed without using bedrails?: None Help needed moving from lying on your back to sitting on the side of a flat bed without using bedrails?: A Little Help needed moving to and from a bed to a chair (including a wheelchair)?: A Little Help needed standing up from a chair using your arms (e.g., wheelchair or bedside chair)?: A Little Help needed to walk in hospital room?: A Little Help needed climbing 3-5 steps with a railing? : A Lot 6 Click Score: 18    End of Session Equipment Utilized During Treatment: Gait belt Activity Tolerance: Patient tolerated treatment well;Patient limited by fatigue Patient left: in bed;with call bell/phone within reach Nurse Communication: Mobility status;Other (comment) (HR response to mobility) PT Visit Diagnosis: Unsteadiness on feet (R26.81);Other abnormalities of gait and mobility (R26.89);Muscle weakness (generalized) (M62.81);History of falling (Z91.81);Difficulty in walking, not elsewhere classified (R26.2)    Time: 3710-6269 PT Time Calculation (min) (ACUTE ONLY): 26 min   Charges:   PT Evaluation $PT Eval Moderate Complexity: 1 Mod PT Treatments $Therapeutic Activity: 8-22 mins        Patrina Levering PT, DPT 11/02/21 3:09 PM [phone]

## 2021-11-03 DIAGNOSIS — I5031 Acute diastolic (congestive) heart failure: Secondary | ICD-10-CM

## 2021-11-03 DIAGNOSIS — F03918 Unspecified dementia, unspecified severity, with other behavioral disturbance: Secondary | ICD-10-CM

## 2021-11-03 DIAGNOSIS — E0590 Thyrotoxicosis, unspecified without thyrotoxic crisis or storm: Secondary | ICD-10-CM

## 2021-11-03 LAB — BASIC METABOLIC PANEL
Anion gap: 13 (ref 5–15)
BUN: 52 mg/dL — ABNORMAL HIGH (ref 8–23)
CO2: 30 mmol/L (ref 22–32)
Calcium: 11 mg/dL — ABNORMAL HIGH (ref 8.9–10.3)
Chloride: 97 mmol/L — ABNORMAL LOW (ref 98–111)
Creatinine, Ser: 1.01 mg/dL — ABNORMAL HIGH (ref 0.44–1.00)
GFR, Estimated: 57 mL/min — ABNORMAL LOW (ref >60)
Glucose, Bld: 156 mg/dL — ABNORMAL HIGH (ref 70–99)
Potassium: 3.9 mmol/L (ref 3.5–5.1)
Sodium: 140 mmol/L (ref 135–145)

## 2021-11-03 LAB — PHOSPHORUS: Phosphorus: 5.3 mg/dL — ABNORMAL HIGH (ref 2.5–4.6)

## 2021-11-03 LAB — CBC WITH DIFFERENTIAL/PLATELET
Abs Immature Granulocytes: 0.01 10*3/uL (ref 0.00–0.07)
Basophils Absolute: 0 10*3/uL (ref 0.0–0.1)
Basophils Relative: 0 %
Eosinophils Absolute: 0 10*3/uL (ref 0.0–0.5)
Eosinophils Relative: 0 %
HCT: 36.1 % (ref 36.0–46.0)
Hemoglobin: 11.3 g/dL — ABNORMAL LOW (ref 12.0–15.0)
Immature Granulocytes: 0 %
Lymphocytes Relative: 11 %
Lymphs Abs: 0.5 10*3/uL — ABNORMAL LOW (ref 0.7–4.0)
MCH: 24.8 pg — ABNORMAL LOW (ref 26.0–34.0)
MCHC: 31.3 g/dL (ref 30.0–36.0)
MCV: 79.3 fL — ABNORMAL LOW (ref 80.0–100.0)
Monocytes Absolute: 0.7 10*3/uL (ref 0.1–1.0)
Monocytes Relative: 13 %
Neutro Abs: 3.8 10*3/uL (ref 1.7–7.7)
Neutrophils Relative %: 76 %
Platelets: 337 10*3/uL (ref 150–400)
RBC: 4.55 MIL/uL (ref 3.87–5.11)
RDW: 14.9 % (ref 11.5–15.5)
WBC: 5.1 10*3/uL (ref 4.0–10.5)
nRBC: 0 % (ref 0.0–0.2)

## 2021-11-03 LAB — GLUCOSE, CAPILLARY
Glucose-Capillary: 211 mg/dL — ABNORMAL HIGH (ref 70–99)
Glucose-Capillary: 188 mg/dL — ABNORMAL HIGH (ref 70–99)

## 2021-11-03 LAB — CBG MONITORING, ED
Glucose-Capillary: 145 mg/dL — ABNORMAL HIGH (ref 70–99)
Glucose-Capillary: 177 mg/dL — ABNORMAL HIGH (ref 70–99)

## 2021-11-03 LAB — T3, FREE: T3, Free: 21.9 pg/mL — ABNORMAL HIGH (ref 2.0–4.4)

## 2021-11-03 LAB — MAGNESIUM: Magnesium: 2.3 mg/dL (ref 1.7–2.4)

## 2021-11-03 MED ORDER — LORAZEPAM 2 MG/ML IJ SOLN
0.5000 mg | Freq: Four times a day (QID) | INTRAMUSCULAR | Status: DC | PRN
  Administered 2021-11-03 (×2): 0.5 mg via INTRAVENOUS
  Filled 2021-11-03 (×2): qty 1

## 2021-11-03 MED ORDER — DIPHENHYDRAMINE HCL 25 MG PO CAPS
50.0000 mg | ORAL_CAPSULE | Freq: Every evening | ORAL | Status: DC | PRN
  Filled 2021-11-03: qty 2

## 2021-11-03 MED ORDER — DIPHENHYDRAMINE HCL 12.5 MG/5ML PO ELIX
50.0000 mg | ORAL_SOLUTION | Freq: Every evening | ORAL | Status: DC | PRN
  Administered 2021-11-03: 50 mg via ORAL
  Filled 2021-11-03 (×2): qty 20

## 2021-11-03 NOTE — Progress Notes (Signed)
PROGRESS NOTE    Kathryn Ware  DJS:[id number] DOB: 15-Jun-1941 DOA: 11/07/2021 PCP: Venia Carbon, MD   Assessment & Plan:   Principal Problem:   Acute on chronic diastolic CHF (congestive heart failure) ([place]) Active Problems:   Type 2 diabetes mellitus with hyperlipidemia (HCC)   Essential hypertension   Mild cognitive impairment   GI AVM (gastrointestinal arteriovenous vascular malformation)   Chronic blood loss anemia   Primary cancer of left upper lobe of lung (HCC)   CHF (congestive heart failure) (HCC)   Bilateral lower extremity edema   Hypertensive urgency   Hypertensive urgency: presented with blood pressure 200/72. Urgency resolved    Clinical hyperthyroidism: w/ bilateral lower extremity weakness with no neurological deficits, progressive weight loss and cachexia, chronic and persistent. Persistent sinus tachycardia. Started and will continue on methimazole. Will need to f/u outpatient w/ endo. Thyroid antibodies are pending. Parotid biopsy recently done was benign.  Dementia: as per pt's sister. Continue w/ supportive care    Chronic blood loss anemia: w/ hx of GI AVMs. H&H are stable    Underweight, protein calorie malnutrition: continue on nutritional supplements    DMII: well controlled, HbA1c 5.5. Continue on SSI w/ accuchecks    History of lung cancer: management per onco as an outpatient    Hypomagnesemia: WNL today.   Acute diastolic CHF: echo shows EF 88-50%, grade I diastolic dysfunction. Monitor I/Os. Continue on propranolol, lisinopril    DVT prophylaxis: SCDs Code Status: full  Family Communication:  discussed pt's care w/ pt's sister, Adela Lank, and answered her questions  Disposition Plan: likely d/c home w/ HH   Level of care: Progressive  Status is: Inpatient  Remains inpatient appropriate because: severity of illness      Consultants:    Procedures:   Antimicrobials:   Subjective: Pt c/o fatigue   Objective: Vitals:    11/02/21 2000 11/02/21 2053 11/02/21 2100 11/03/21 0330  BP: (!) 145/87 (!) 167/77 (!) 168/71 (!) 139/59  Pulse: 95 96 92 95  Resp: (!) 21 20 18 18   Temp:  98.7 F (37.1 C)  97.7 F (36.5 C)  TempSrc:  Oral  Oral  SpO2: 100% 97% 96% 100%  Weight:      Height:       No intake or output data in the 24 hours ending 11/03/21 0853 Filed Weights   11/17/2021 1441  Weight: 39.5 kg    Examination:  General exam: Appears calm and comfortable. Cachetic appearing  Respiratory system: Clear to auscultation. Respiratory effort normal. Cardiovascular system: S1 & S2 +. No  rubs, gallops or clicks. No pedal edema. Gastrointestinal system: Abdomen is nondistended, soft and nontender. Normal bowel sounds heard. Central nervous system: Alert and oriented intermittently. Moves all extremities  Psychiatry: Judgement and insight appear poor. Flat mood and affect     Data Reviewed: I have personally reviewed following labs and imaging studies  CBC: Recent Labs  Lab 11/03/2021 1655 11/02/21 0614 11/03/21 0621  WBC 3.4* 2.9* 5.1  NEUTROABS 2.1 2.3 3.8  HGB 9.3* 10.3* 11.3*  HCT 29.8* 33.1* 36.1  MCV 81.4 80.7 79.3*  PLT 226 267 277   Basic Metabolic Panel: Recent Labs  Lab 11/05/2021 1655 11/02/21 0614 11/03/21 0621  NA 139 142 140  K 3.6 3.5 3.9  CL 101 100 97*  CO2 26 24 30   GLUCOSE 70 73 156*  BUN 19 25* 52*  CREATININE 0.63 0.78 1.01*  CALCIUM 9.7 10.5* 11.0*  MG 1.3*  --  2.3  PHOS  --   --  5.3*   GFR: Estimated Creatinine Clearance: 28.2 mL/min (A) (by C-G formula based on SCr of 1.01 mg/dL (H)). Liver Function Tests: Recent Labs  Lab 11/09/2021 1655  AST 29  ALT 35  ALKPHOS 57  BILITOT 0.5  PROT 6.4*  ALBUMIN 3.1*   No results for input(s): LIPASE, AMYLASE in the last 168 hours. No results for input(s): AMMONIA in the last 168 hours. Coagulation Profile: No results for input(s): INR, PROTIME in the last 168 hours. Cardiac Enzymes: No results for input(s):  CKTOTAL, CKMB, CKMBINDEX, TROPONINI in the last 168 hours. BNP (last 3 results) No results for input(s): PROBNP in the last 8760 hours. HbA1C: Recent Labs    11/02/21 0614  HGBA1C 5.5   CBG: Recent Labs  Lab 11/02/21 0745 11/02/21 1142 11/02/21 1755 11/02/21 2119 11/03/21 0742  GLUCAP 76 326* 273* 101* 145*   Lipid Profile: No results for input(s): CHOL, HDL, LDLCALC, TRIG, CHOLHDL, LDLDIRECT in the last 72 hours. Thyroid Function Tests: Recent Labs    11/02/21 0614  TSH <0.010*  FREET4 >5.50*   Anemia Panel: No results for input(s): VITAMINB12, FOLATE, FERRITIN, TIBC, IRON, RETICCTPCT in the last 72 hours. Sepsis Labs: No results for input(s): PROCALCITON, LATICACIDVEN in the last 168 hours.  Recent Results (from the past 240 hour(s))  Resp Panel by RT-PCR (Flu A&B, Covid) Nasopharyngeal Swab     Status: None   Collection Time: 11/03/2021  9:07 PM   Specimen: Nasopharyngeal Swab; Nasopharyngeal(NP) swabs in vial transport medium  Result Value Ref Range Status   SARS Coronavirus 2 by RT PCR NEGATIVE NEGATIVE Final    Comment: (NOTE) SARS-CoV-2 target nucleic acids are NOT DETECTED.  The SARS-CoV-2 RNA is generally detectable in upper respiratory specimens during the acute phase of infection. The lowest concentration of SARS-CoV-2 viral copies this assay can detect is 138 copies/mL. A negative result does not preclude SARS-Cov-2 infection and should not be used as the sole basis for treatment or other patient management decisions. A negative result may occur with  improper specimen collection/handling, submission of specimen other than nasopharyngeal swab, presence of viral mutation(s) within the areas targeted by this assay, and inadequate number of viral copies(<138 copies/mL). A negative result must be combined with clinical observations, patient history, and epidemiological information. The expected result is Negative.  Fact Sheet for Patients:   EntrepreneurPulse.com.au  Fact Sheet for Healthcare Providers:  IncredibleEmployment.be  This test is no t yet approved or cleared by the [place] FDA and  has been authorized for detection and/or diagnosis of SARS-CoV-2 by FDA under an Emergency Use Authorization (EUA). This EUA will remain  in effect (meaning this test can be used) for the duration of the COVID-19 declaration under Section 564(b)(1) of the Act, 21 U.S.C.section 360bbb-3(b)(1), unless the authorization is terminated  or revoked sooner.       Influenza A by PCR NEGATIVE NEGATIVE Final   Influenza B by PCR NEGATIVE NEGATIVE Final    Comment: (NOTE) The Xpert Xpress SARS-CoV-2/FLU/RSV plus assay is intended as an aid in the diagnosis of influenza from Nasopharyngeal swab specimens and should not be used as a sole basis for treatment. Nasal washings and aspirates are unacceptable for Xpert Xpress SARS-CoV-2/FLU/RSV testing.  Fact Sheet for Patients: EntrepreneurPulse.com.au  Fact Sheet for Healthcare Providers: IncredibleEmployment.be  This test is not yet approved or cleared by the [place] FDA and has been authorized for detection and/or diagnosis of SARS-CoV-2 by  FDA under an Emergency Use Authorization (EUA). This EUA will remain in effect (meaning this test can be used) for the duration of the COVID-19 declaration under Section 564(b)(1) of the Act, 21 U.S.C. section 360bbb-3(b)(1), unless the authorization is terminated or revoked.  Performed at [facility], [address], [place], [place] [address]          Radiology Studies: DG Chest 1 View  Result Date: 10/28/2021 CLINICAL DATA:  Weakness EXAM: CHEST  1 VIEW COMPARISON:  09/28/2021 FINDINGS: Heart size is normal. Moderate to high-grade calcifications are again seen within the aortic arch. There is flattening of the diaphragms and moderate to high-grade  hyperinflation, unchanged. There is new mild bilateral diffuse interstitial thickening. No focal airspace consolidation. Unchanged calcifications overlying the bilateral lung apices, seen to correspond to vascular calcifications of the bilateral subclavian arteries on prior 10/11/2021 CT. No pleural effusion or pneumothorax. Moderate multilevel degenerative disc changes of the thoracic spine. Moderate thoracolumbar dextrocurvature with moderate left sided disc space narrowing. IMPRESSION:: IMPRESSION: 1. Chronic moderate to high-grade hyperinflation compatible with COPD. 2. Increased mild bilateral interstitial thickening compatible with mild interstitial pulmonary edema. Electronically Signed   By: Yvonne Kendall   On: 11/22/2021 16:52   CT Head Wo Contrast  Result Date: 11/02/2021 CLINICAL DATA:  Fall several times.  Stroke suspected EXAM: CT HEAD WITHOUT CONTRAST TECHNIQUE: Contiguous axial images were obtained from the base of the skull through the vertex without intravenous contrast. COMPARISON:  01/05/2020 FINDINGS: Brain: No evidence of acute infarction, hemorrhage, hydrocephalus, extra-axial collection or mass lesion/mass effect. Interval but well-defined low-density at the left thalamus. Cerebral volume loss in keeping with age. Vascular: No hyperdense vessel or unexpected calcification. Skull: Normal. Negative for fracture or focal lesion. Sinuses/Orbits: No acute finding. IMPRESSION: 1. No acute finding. 2. Interval but chronic appearing lacunar infarct at the left thalamus when compared to 2021. Electronically Signed   By: Jorje Guild M.D.   On: 11/02/2021 05:02   US Venous Img Lower Bilateral (DVT)  Result Date: 11/02/2021 CLINICAL DATA:  Bilateral lower extremity edema. EXAM: BILATERAL LOWER EXTREMITY VENOUS DOPPLER ULTRASOUND TECHNIQUE: Gray-scale sonography with compression, as well as color and duplex ultrasound, were performed to evaluate the deep venous system(s) from the level of the  common femoral vein through the popliteal and proximal calf veins. COMPARISON:  None. FINDINGS: VENOUS Normal compressibility of the common femoral, superficial femoral, and popliteal veins, as well as the visualized calf veins. Visualized portions of profunda femoral vein and great saphenous vein unremarkable. No filling defects to suggest DVT on grayscale or color Doppler imaging. Doppler waveforms show normal direction of venous flow, normal respiratory plasticity and response to augmentation. Limited views of the contralateral common femoral vein are unremarkable. OTHER None. Limitations: none IMPRESSION: Negative. Electronically Signed   By: Telford Nab M.D.   On: 11/02/2021 00:27   ECHOCARDIOGRAM COMPLETE  Result Date: 11/02/2021    ECHOCARDIOGRAM REPORT   Patient Name:   ASHLEEN DEMMA Date of Exam: 11/02/2021 Medical Rec #:  [id number]     Height:       65.0 in Accession #:    [id number]    Weight:       87.1 lb Date of Birth:  Nov 29, 1940     BSA:          1.389 m Patient Age:    22 years      BP:           152/65 mmHg Patient  Gender: F             HR:           98 bpm. Exam Location:  [facility] Procedure: 2D Echo, Color Doppler and Cardiac Doppler Indications:     I50.31 congestive heart failure-Acute Diastolic  History:         Patient has prior history of Echocardiogram examinations. CHF,                  CAD, CKD, PAD and TIA; Risk Factors:Hypertension, Diabetes and                  Dyslipidemia.  Sonographer:     Charmayne Sheer Referring Phys:  [id number] Athena Masse Diagnosing Phys: [place]  1. Left ventricular ejection fraction, by estimation, is 60 to 65%. The left ventricle has normal function. The left ventricle has no regional wall motion abnormalities. There is severe concentric left ventricular hypertrophy. Left ventricular diastolic  parameters are consistent with Grade I diastolic dysfunction (impaired relaxation).  2. Right ventricular systolic function is normal. The right  ventricular size is normal.  3. Left atrial size was mild to moderately dilated.  4. The mitral valve is myxomatous. Mild mitral valve regurgitation. No evidence of mitral stenosis.  5. The aortic valve is normal in structure. Aortic valve regurgitation is not visualized. No aortic stenosis is present.  6. The inferior vena cava is normal in size with greater than 50% respiratory variability, suggesting right atrial pressure of 3 mmHg. FINDINGS  Left Ventricle: Left ventricular ejection fraction, by estimation, is 60 to 65%. The left ventricle has normal function. The left ventricle has no regional wall motion abnormalities. The left ventricular internal cavity size was normal in size. There is  severe concentric left ventricular hypertrophy. Left ventricular diastolic parameters are consistent with Grade I diastolic dysfunction (impaired relaxation). Right Ventricle: The right ventricular size is normal. No increase in right ventricular wall thickness. Right ventricular systolic function is normal. Left Atrium: Left atrial size was mild to moderately dilated. Right Atrium: Right atrial size was normal in size. Pericardium: There is no evidence of pericardial effusion. Mitral Valve: The mitral valve is myxomatous. There is severe thickening of the mitral valve leaflet(s). Mild mitral annular calcification. Mild mitral valve regurgitation. No evidence of mitral valve stenosis. MV peak gradient, 8.5 mmHg. The mean mitral  valve gradient is 4.0 mmHg. Tricuspid Valve: The tricuspid valve is normal in structure. Tricuspid valve regurgitation is mild . No evidence of tricuspid stenosis. Aortic Valve: The aortic valve is normal in structure. Aortic valve regurgitation is not visualized. No aortic stenosis is present. Aortic valve mean gradient measures 5.0 mmHg. Aortic valve peak gradient measures 8.2 mmHg. Aortic valve area, by VTI measures 3.26 cm. Pulmonic Valve: The pulmonic valve was normal in structure. Pulmonic  valve regurgitation is not visualized. No evidence of pulmonic stenosis. Aorta: The aortic root is normal in size and structure. Venous: The inferior vena cava is normal in size with greater than 50% respiratory variability, suggesting right atrial pressure of 3 mmHg. IAS/Shunts: No atrial level shunt detected by color flow Doppler.  LEFT VENTRICLE PLAX 2D LVIDd:         3.13 cm   Diastology LVIDs:         2.05 cm   LV e' medial:    4.03 cm/s LV PW:         1.02 cm   LV E/e' medial:  29.3 LV  IVS:        0.62 cm   LV e' lateral:   4.35 cm/s LVOT diam:     2.20 cm   LV E/e' lateral: 27.1 LV SV:         84 LV SV Index:   60 LVOT Area:     3.80 cm  RIGHT VENTRICLE RV Basal diam:  1.70 cm LEFT ATRIUM             Index        RIGHT ATRIUM          Index LA diam:        2.40 cm 1.73 cm/m   RA Area:     4.13 cm LA Vol (A2C):   25.3 ml 18.21 ml/m  RA Volume:   4.40 ml  3.17 ml/m LA Vol (A4C):   31.0 ml 22.31 ml/m LA Biplane Vol: 27.6 ml 19.87 ml/m  AORTIC VALVE                     PULMONIC VALVE AV Area (Vmax):    3.08 cm      PV Vmax:       1.26 m/s AV Area (Vmean):   3.01 cm      PV Vmean:      85.200 cm/s AV Area (VTI):     3.26 cm      PV VTI:        0.163 m AV Vmax:           143.00 cm/s   PV Peak grad:  6.4 mmHg AV Vmean:          103.000 cm/s  PV Mean grad:  3.0 mmHg AV VTI:            0.258 m AV Peak Grad:      8.2 mmHg AV Mean Grad:      5.0 mmHg LVOT Vmax:         116.00 cm/s LVOT Vmean:        81.500 cm/s LVOT VTI:          0.221 m LVOT/AV VTI ratio: 0.86  AORTA Ao Root diam: 2.70 cm MITRAL VALVE MV Area (PHT): 6.60 cm     SHUNTS MV Area VTI:   3.23 cm     Systemic VTI:  0.22 m MV Peak grad:  8.5 mmHg     Systemic Diam: 2.20 cm MV Mean grad:  4.0 mmHg MV Vmax:       1.46 m/s MV Vmean:      99.6 cm/s MV Decel Time: 115 msec MV E velocity: 118.00 cm/s MV A velocity: 176.00 cm/s MV E/A ratio:  0.67 Shaukat Khan Electronically signed by Neoma Laming Signature Date/Time: 11/02/2021/9:24:38 PM    Final     US THYROID  Result Date: 11/02/2021 CLINICAL DATA:  Hyperthyroid. EXAM: THYROID ULTRASOUND TECHNIQUE: Ultrasound examination of the thyroid gland and adjacent soft tissues was performed. COMPARISON:  None. FINDINGS: Parenchymal Echotexture: Markedly heterogenous. Gland vascularity within normal limits. Isthmus: 0.2 cm Right lobe: 6.0 x 2.4 x 1.9 cm Left lobe: 6.0 x 2.1 x 1.8 cm _________________________________________________________ Estimated total number of nodules >/= 1 cm: 4 Number of spongiform nodules >/=  2 cm not described below (TR1): 0 Number of mixed cystic and solid nodules >/= 1.5 cm not described below (TR2): 0 _________________________________________________________ Nodule labeled 1 is a solid hypoechoic nodule (TR 4) in the superior right thyroid lobe measuring 1.1 x 1.0  x 0.5 cm. *Given size (>/= 1 - 1.4 cm) and appearance, a follow-up ultrasound in 1 year should be considered based on TI-RADS criteria. Nodule labeled 2 is a solid hypoechoic nodule (TR 4) in the mid right thyroid lobe measuring 1.1 x 1.1 x 0.9 cm. *Given size (>/= 1 - 1.4 cm) and appearance, a follow-up ultrasound in 1 year should be considered based on TI-RADS criteria. Nodule labeled 3 is a mixed solid and cystic hypoechoic nodule (TR 3) in the inferior left thyroid lobe measuring 1.1 x 1.1 x 0.9 cm. Given size (<1.4 cm) and appearance, this nodule does NOT meet TI-RADS criteria for biopsy or dedicated follow-up. Nodule labeled 4 is a solid hypoechoic nodule (TR 4) in the mid left thyroid lobe measuring 1.0 x 0.9 x 0.5 cm. *Given size (>/= 1 - 1.4 cm) and appearance, a follow-up ultrasound in 1 year should be considered based on TI-RADS criteria. IMPRESSION: 1. Enlarged, heterogeneous multinodular thyroid gland. Thyroid gland vascularity appears within normal limits. 2. Nodules labeled 1, 2 and 4 meet criteria for follow-up ultrasound in 1 year. The above is in keeping with the ACR TI-RADS recommendations - J Am Coll  Radiol 2017;14:587-595. Electronically Signed   By: Albin Felling M.D.   On: 11/02/2021 14:13        Scheduled Meds:  feeding supplement  237 mL Oral TID BM   insulin aspart  0-5 Units Subcutaneous QHS   insulin aspart  0-9 Units Subcutaneous TID WC   lisinopril  10 mg Oral Daily   magnesium oxide  400 mg Oral BID   methimazole  5 mg Oral Daily   multivitamin with minerals  1 tablet Oral Daily   nitroGLYCERIN  1 inch Topical Q6H   propranolol  40 mg Oral BID   sodium chloride flush  3 mL Intravenous Q12H   Continuous Infusions:  sodium chloride       LOS: 2 days    Time spent: 30 mins     Wyvonnia Dusky, MD [facility] Pager [phone]  If 7PM-7AM, please contact night-coverage 11/03/2021, 8:53 AM

## 2021-11-03 NOTE — Progress Notes (Signed)
OT Cancellation Note  Patient Details Name: Kathryn Ware MRN: [id number] DOB: 09/01/1941   Cancelled Treatment:    Reason Eval/Treat Not Completed: Other (comment). Pt with nursing for care. Will re-attempt OT tx at later date/time as pt is available and appropriate.   Ardeth Perfect., MPH, MS, OTR/L ascom [phone] 11/03/21, 3:36 PM

## 2021-11-03 NOTE — Progress Notes (Signed)
Report received from ICU Nurse, Barbette Merino at 16:00.  Patient arrived floor at 16:20. Sleepy but easy to rouse. Alert and oriented at the moment. Vitals taken. Patient stable. Patient assessed.  Patient's belongings in room.   Skin assessment completed with Linard Millers, RN. Small scab on left knee. Patient is severely emaciated. Sacral foam applied.   Patient has a history of Dementia which occasionally causes her to be confused but she is easily oriented.  Sister currently at bedside: Evangeline Gula.

## 2021-11-03 NOTE — Progress Notes (Signed)
PT Cancellation Note  Patient Details Name: Kathryn Ware MRN: [id number] DOB: 1941-10-08   Cancelled Treatment:     PT attempt. Pt is currently being transported from ED to floor. PT will continue to follow and return tomorrow.    Willette Pa 11/03/2021, 4:17 PM

## 2021-11-03 NOTE — Progress Notes (Signed)
OT Cancellation Note  Patient Details Name: Kathryn Ware MRN: [id number] DOB: 12-28-1940   Cancelled Treatment:    Reason Eval/Treat Not Completed: Patient not medically ready; per nurse, pt just got to this room, is pulling (restless), and not medically stable.   Leta Speller, MS, OTR/L  Kathryn Ware 11/03/2021, 4:50 PM

## 2021-11-04 ENCOUNTER — Inpatient Hospital Stay: Payer: Medicare Other

## 2021-11-04 DIAGNOSIS — E43 Unspecified severe protein-calorie malnutrition: Secondary | ICD-10-CM | POA: Insufficient documentation

## 2021-11-04 DIAGNOSIS — E0590 Thyrotoxicosis, unspecified without thyrotoxic crisis or storm: Secondary | ICD-10-CM

## 2021-11-04 DIAGNOSIS — F03918 Unspecified dementia, unspecified severity, with other behavioral disturbance: Secondary | ICD-10-CM | POA: Diagnosis not present

## 2021-11-04 DIAGNOSIS — I5031 Acute diastolic (congestive) heart failure: Secondary | ICD-10-CM | POA: Diagnosis not present

## 2021-11-04 DIAGNOSIS — I5033 Acute on chronic diastolic (congestive) heart failure: Secondary | ICD-10-CM | POA: Diagnosis not present

## 2021-11-04 LAB — BASIC METABOLIC PANEL
Anion gap: 8 (ref 5–15)
Anion gap: 15 (ref 5–15)
BUN: 54 mg/dL — ABNORMAL HIGH (ref 8–23)
BUN: 52 mg/dL — ABNORMAL HIGH (ref 8–23)
CO2: 31 mmol/L (ref 22–32)
CO2: 29 mmol/L (ref 22–32)
Calcium: 10.6 mg/dL — ABNORMAL HIGH (ref 8.9–10.3)
Calcium: 10.8 mg/dL — ABNORMAL HIGH (ref 8.9–10.3)
Chloride: 102 mmol/L (ref 98–111)
Chloride: 101 mmol/L (ref 98–111)
Creatinine, Ser: 0.69 mg/dL (ref 0.44–1.00)
Creatinine, Ser: 0.97 mg/dL (ref 0.44–1.00)
GFR, Estimated: >60 mL/min (ref >60)
GFR, Estimated: 59 mL/min — ABNORMAL LOW (ref >60)
Glucose, Bld: 158 mg/dL — ABNORMAL HIGH (ref 70–99)
Glucose, Bld: 83 mg/dL (ref 70–99)
Potassium: 3.6 mmol/L (ref 3.5–5.1)
Potassium: 4.7 mmol/L (ref 3.5–5.1)
Sodium: 141 mmol/L (ref 135–145)
Sodium: 145 mmol/L (ref 135–145)

## 2021-11-04 LAB — ALBUMIN: Albumin: 3.2 g/dL — ABNORMAL LOW (ref 3.5–5.0)

## 2021-11-04 LAB — CBC
HCT: 30.2 % — ABNORMAL LOW (ref 36.0–46.0)
HCT: 37.4 % (ref 36.0–46.0)
Hemoglobin: 9.4 g/dL — ABNORMAL LOW (ref 12.0–15.0)
Hemoglobin: 11.3 g/dL — ABNORMAL LOW (ref 12.0–15.0)
MCH: 25 pg — ABNORMAL LOW (ref 26.0–34.0)
MCH: 25.6 pg — ABNORMAL LOW (ref 26.0–34.0)
MCHC: 31.1 g/dL (ref 30.0–36.0)
MCHC: 30.2 g/dL (ref 30.0–36.0)
MCV: 80.3 fL (ref 80.0–100.0)
MCV: 84.6 fL (ref 80.0–100.0)
Platelets: 274 10*3/uL (ref 150–400)
Platelets: 277 10*3/uL (ref 150–400)
RBC: 3.76 MIL/uL — ABNORMAL LOW (ref 3.87–5.11)
RBC: 4.42 MIL/uL (ref 3.87–5.11)
RDW: 14.9 % (ref 11.5–15.5)
RDW: 14.7 % (ref 11.5–15.5)
WBC: 4 10*3/uL (ref 4.0–10.5)
WBC: 9.3 10*3/uL (ref 4.0–10.5)
nRBC: 0 % (ref 0.0–0.2)
nRBC: 0.9 % — ABNORMAL HIGH (ref 0.0–0.2)

## 2021-11-04 LAB — GLUCOSE, CAPILLARY
Glucose-Capillary: 184 mg/dL — ABNORMAL HIGH (ref 70–99)
Glucose-Capillary: 242 mg/dL — ABNORMAL HIGH (ref 70–99)
Glucose-Capillary: 103 mg/dL — ABNORMAL HIGH (ref 70–99)
Glucose-Capillary: 84 mg/dL (ref 70–99)
Glucose-Capillary: 210 mg/dL — ABNORMAL HIGH (ref 70–99)
Glucose-Capillary: 68 mg/dL — ABNORMAL LOW (ref 70–99)
Glucose-Capillary: 235 mg/dL — ABNORMAL HIGH (ref 70–99)

## 2021-11-04 LAB — BRAIN NATRIURETIC PEPTIDE: B Natriuretic Peptide: 1929.7 pg/mL — ABNORMAL HIGH (ref 0.0–100.0)

## 2021-11-04 LAB — THYROID ANTIBODIES
Thyroglobulin Antibody: <1 IU/mL (ref 0.0–0.9)
Thyroperoxidase Ab SerPl-aCnc: 9 IU/mL (ref 0–34)

## 2021-11-04 LAB — BLOOD GAS, ARTERIAL
Acid-Base Excess: 1.2 mmol/L (ref 0.0–2.0)
Acid-base deficit: 0.9 mmol/L (ref 0.0–2.0)
Bicarbonate: 28.2 mmol/L — ABNORMAL HIGH (ref 20.0–28.0)
Bicarbonate: 29.1 mmol/L — ABNORMAL HIGH (ref 20.0–28.0)
FIO2: 0.6
FIO2: 0.7
MECHVT: 300 mL
MECHVT: 300 mL
Mechanical Rate: 15
O2 Saturation: 99.8 %
O2 Saturation: 99.3 %
PEEP: 5 cmH2O
PEEP: 5 cmH2O
Patient temperature: 37
Patient temperature: 37
RATE: 15 resp/min
pCO2 arterial: 69 mmHg (ref 32.0–48.0)
pCO2 arterial: 62 mmHg — ABNORMAL HIGH (ref 32.0–48.0)
pH, Arterial: 7.22 — ABNORMAL LOW (ref 7.350–7.450)
pH, Arterial: 7.28 — ABNORMAL LOW (ref 7.350–7.450)
pO2, Arterial: 249 mmHg — ABNORMAL HIGH (ref 83.0–108.0)
pO2, Arterial: 161 mmHg — ABNORMAL HIGH (ref 83.0–108.0)

## 2021-11-04 LAB — T4, FREE: Free T4: 5.05 ng/dL — ABNORMAL HIGH (ref 0.61–1.12)

## 2021-11-04 LAB — MAGNESIUM: Magnesium: 2.1 mg/dL (ref 1.7–2.4)

## 2021-11-04 LAB — PROCALCITONIN: Procalcitonin: <0.1 ng/mL

## 2021-11-04 LAB — TROPONIN I (HIGH SENSITIVITY)
Troponin I (High Sensitivity): 200 ng/L (ref <18)
Troponin I (High Sensitivity): 327 ng/L (ref <18)

## 2021-11-04 LAB — TSH: TSH: <0.01 u[IU]/mL — ABNORMAL LOW (ref 0.350–4.500)

## 2021-11-04 LAB — LACTIC ACID, PLASMA
Lactic Acid, Venous: 6.2 mmol/L (ref 0.5–1.9)
Lactic Acid, Venous: 2.1 mmol/L (ref 0.5–1.9)

## 2021-11-04 LAB — MRSA NEXT GEN BY PCR, NASAL: MRSA by PCR Next Gen: NOT DETECTED

## 2021-11-04 IMAGING — CT CT CHEST SUPER D W/O CM
2 of 5 series · 15 of 36 positions shown, 18 images · non-contrast
Comparison: None.

CLINICAL DATA: Bronchoscopic planning, follow-up PET-CT 11/12/2020

EXAM:
CT CHEST WITHOUT CONTRAST
TECHNIQUE: Multidetector CT imaging of the chest was performed using thin slice
collimation for electromagnetic bronchoscopy planning purposes,
without intravenous contrast.

[Series 4: thins chest 1.00 · axial · 0.52mm/px · z∈[-1196,-893]mm · 12 of 500 slices shown, 15 images]
[im 34/500  mediastinal]
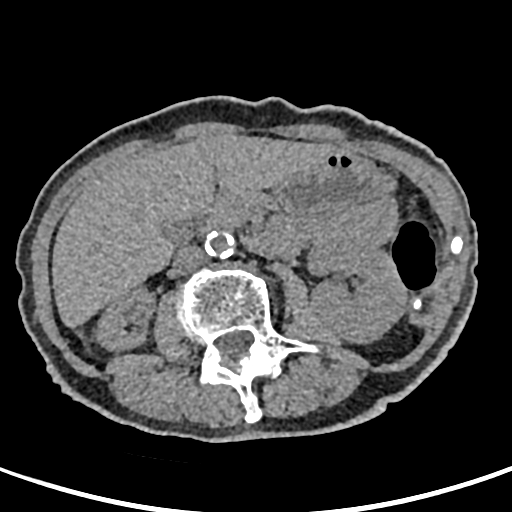
[im 34/500  lung]
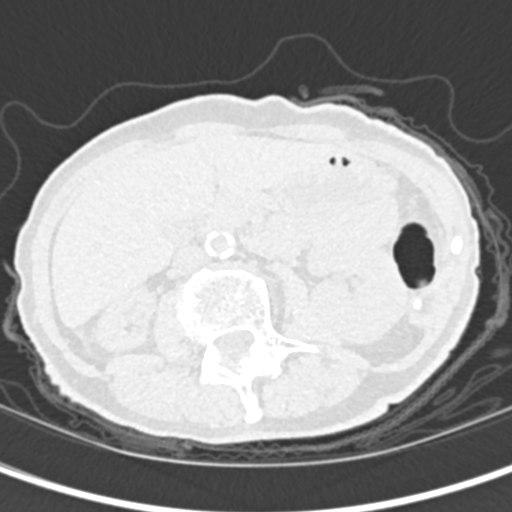
[im 67/500  lung]
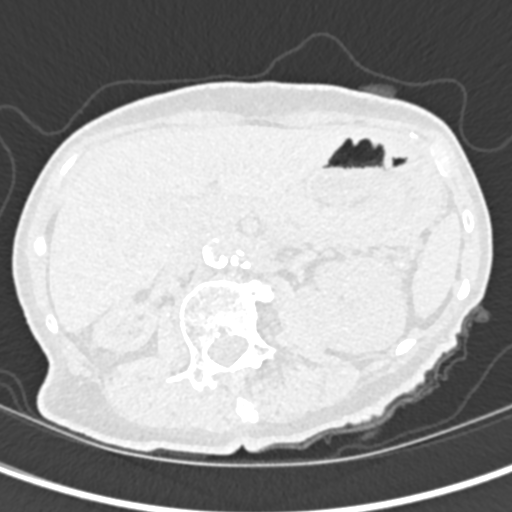
[im 100/500  lung]
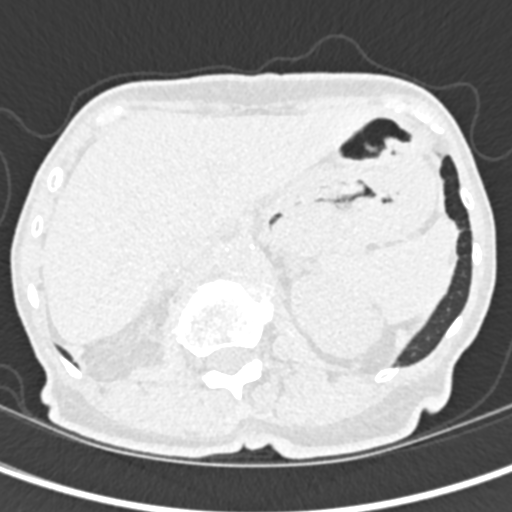
[im 167/500  lung]
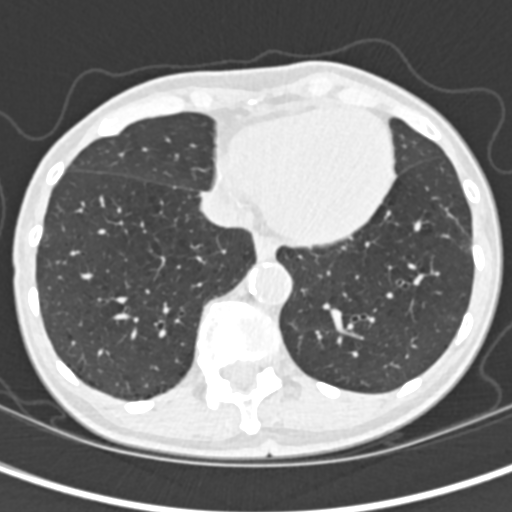
[im 200/500  mediastinal]
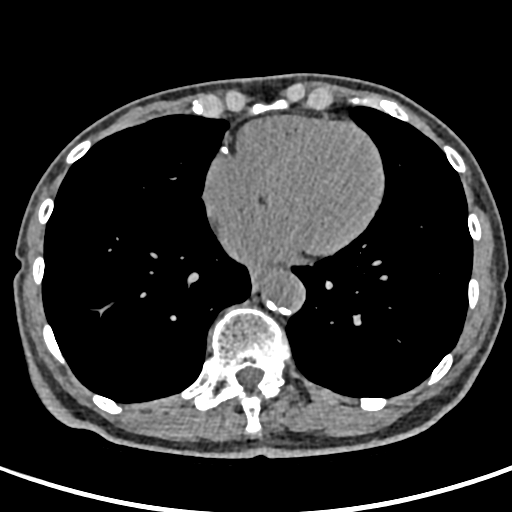
[im 200/500  lung]
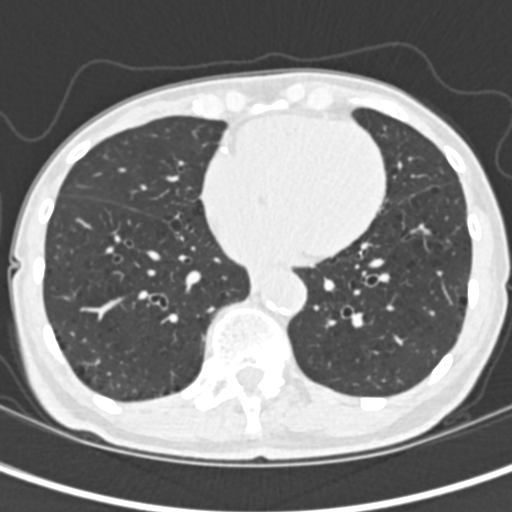
[im 233/500  lung]
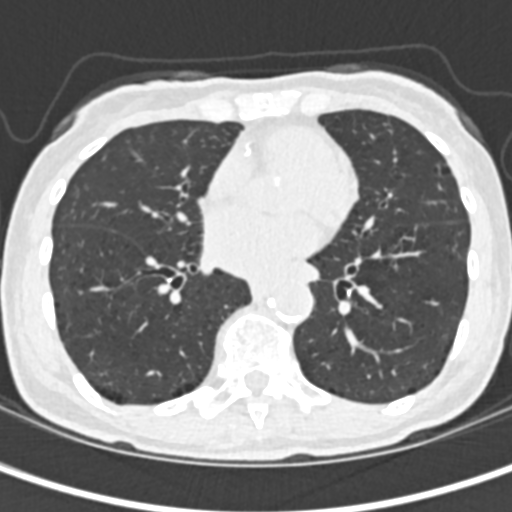
[im 267/500  lung]
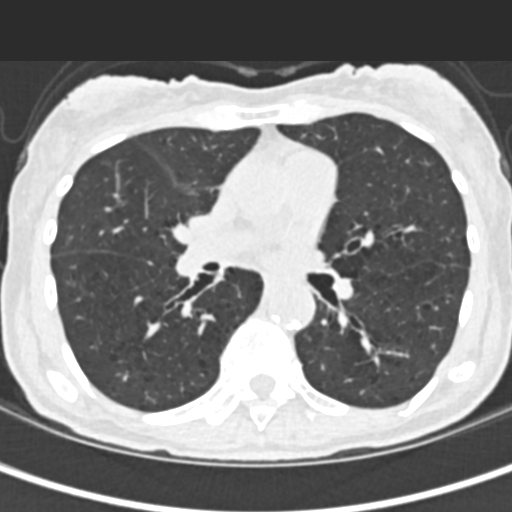
[im 300/500  lung]
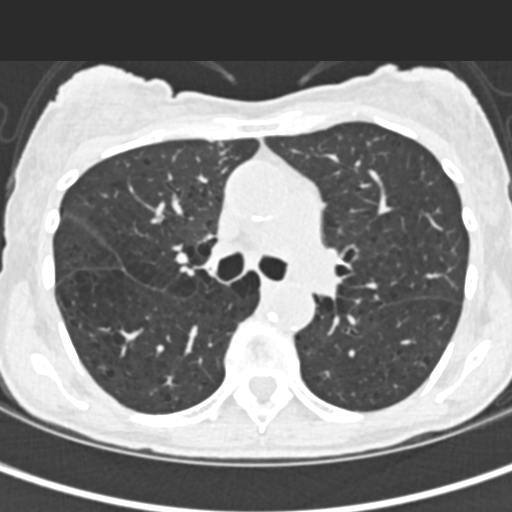
[im 333/500  mediastinal]
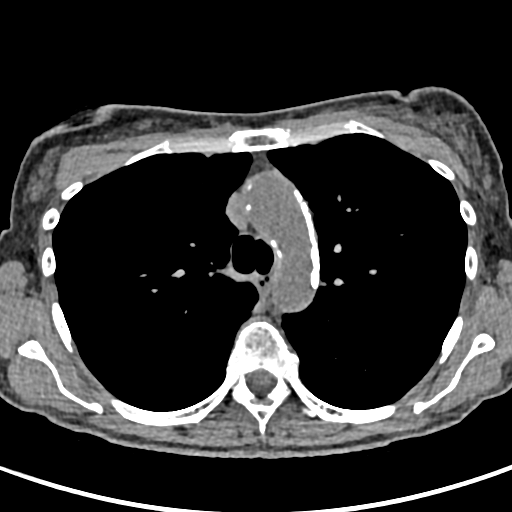
[im 333/500  lung]
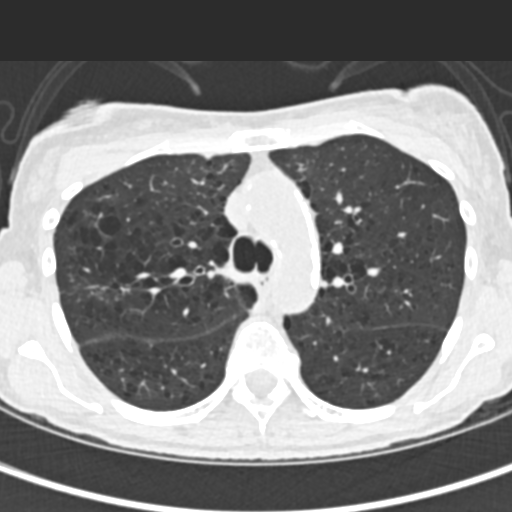
[im 400/500  lung]
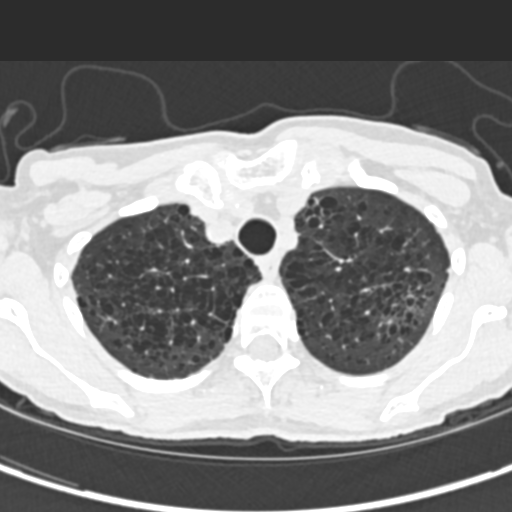
[im 433/500  lung]
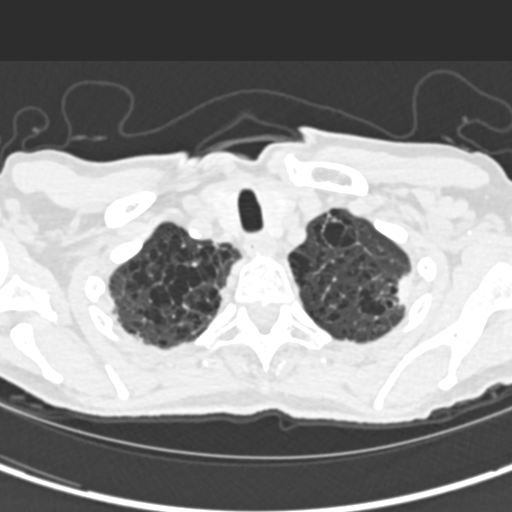
[im 466/500  lung]
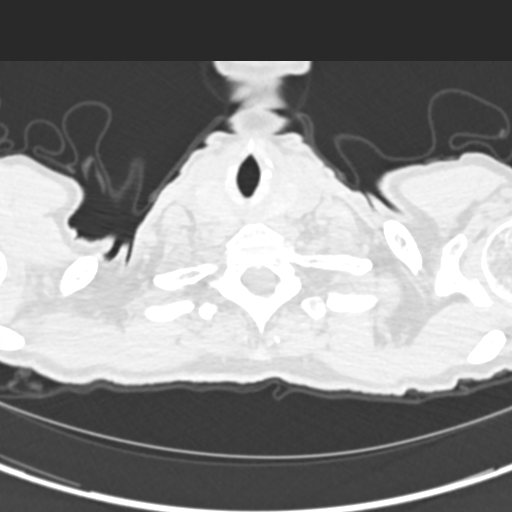

[Series 5: coronals chest 2.00 cor · coronal · 0.52mm/px · 3 of 104 slices shown]
[im 21/104  lung]
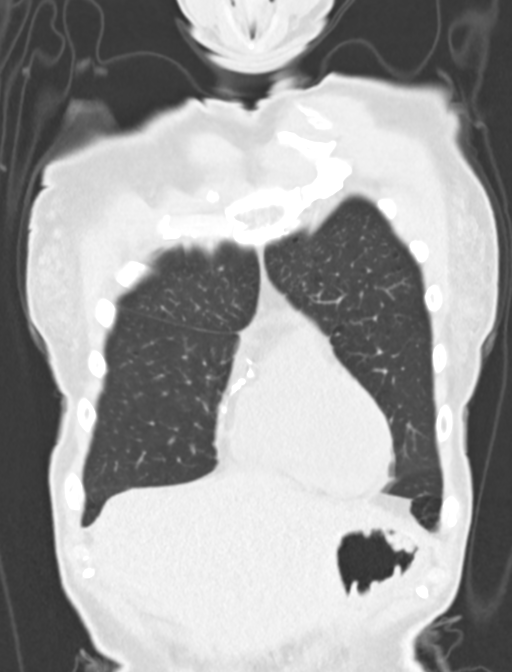
[im 42/104  lung]
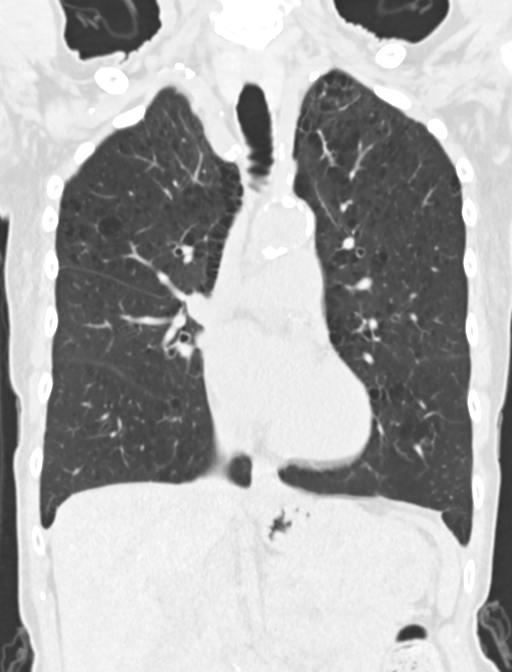
[im 62/104  lung]
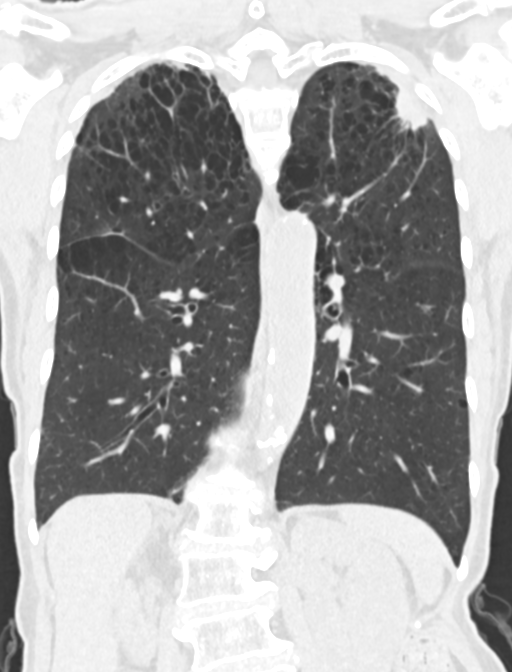

[15 of 36 positions shown; findings below may reference images not displayed]

FINDINGS: Cardiovascular: Aortic atherosclerosis. Normal heart size.
Three-vessel coronary artery calcifications. No pericardial
effusion.

Mediastinum/Nodes: No enlarged mediastinal, hilar, or axillary lymph
nodes. Thyroid gland, trachea, and esophagus demonstrate no
significant findings.

Lungs/Pleura: Moderate to severe centrilobular emphysema. Unchanged
spiculated mass of the peripheral left upper lobe measuring 2.6 x
1.7 cm, previously FDG avid. Unchanged 3 mm subpleural nodule of the
posterior right upper lobe (series 3, image 43). Minimal bandlike
scarring and or atelectasis of the peripheral right upper lobe
(series 3, image 46). No pleural effusion or pneumothorax.

Upper Abdomen: No acute abnormality. Asymmetrically atrophic right
kidney in the included upper abdomen.

Musculoskeletal: No chest wall mass or suspicious bone lesions
identified.
IMPRESSION: 1. Unchanged spiculated mass of the peripheral left upper lobe
measuring 2.6 x 1.7 cm, previously FDG avid. Findings remain
consistent with primary lung malignancy.
2. Unchanged 3 mm subpleural nodule of the posterior right upper
lobe, nonspecific, although statistically likely to be benign
sequelae of prior infection or inflammation.
3. Moderate to severe centrilobular emphysema.
4. Coronary artery disease.

Aortic Atherosclerosis (IZJPZ-JH4.4) and Emphysema (IZJPZ-BX3.6).

## 2021-11-04 MED ORDER — CHLORHEXIDINE GLUCONATE 0.12% ORAL RINSE (MEDLINE KIT)
15.0000 mL | Freq: Two times a day (BID) | OROMUCOSAL | Status: DC
  Administered 2021-11-04 – 2021-11-06 (×4): 15 mL via OROMUCOSAL

## 2021-11-04 MED ORDER — SIMVASTATIN 20 MG PO TABS: 80.0000 mg | ORAL_TABLET | Freq: Every day | ORAL | Status: DC

## 2021-11-04 MED ORDER — METHYLPREDNISOLONE SODIUM SUCC 40 MG IJ SOLR
40.0000 mg | Freq: Once | INTRAMUSCULAR | Status: AC
  Administered 2021-11-04: 40 mg via INTRAVENOUS
  Filled 2021-11-04: qty 1

## 2021-11-04 MED ORDER — BUDESONIDE 0.25 MG/2ML IN SUSP
0.2500 mg | Freq: Two times a day (BID) | RESPIRATORY_TRACT | Status: DC
  Administered 2021-11-04 – 2021-11-06 (×4): 0.25 mg via RESPIRATORY_TRACT
  Filled 2021-11-04 (×4): qty 2

## 2021-11-04 MED ORDER — GUAIFENESIN ER 600 MG PO TB12
600.0000 mg | ORAL_TABLET | Freq: Two times a day (BID) | ORAL | Status: DC
  Administered 2021-11-04 – 2021-11-05 (×3): 600 mg via ORAL
  Filled 2021-11-04 (×3): qty 1

## 2021-11-04 MED ORDER — ORAL CARE MOUTH RINSE
15.0000 mL | OROMUCOSAL | Status: DC
  Administered 2021-11-04 – 2021-11-06 (×18): 15 mL via OROMUCOSAL

## 2021-11-04 MED ORDER — FUROSEMIDE 10 MG/ML IJ SOLN
40.0000 mg | Freq: Once | INTRAMUSCULAR | Status: AC
  Administered 2021-11-04: 40 mg via INTRAVENOUS
  Filled 2021-11-04: qty 4

## 2021-11-04 MED ORDER — PROPOFOL 1000 MG/100ML IV EMUL
0.0000 ug/kg/min | INTRAVENOUS | Status: DC
  Administered 2021-11-04: 5 ug/kg/min via INTRAVENOUS
  Filled 2021-11-04: qty 100

## 2021-11-04 MED ORDER — FERROUS SULFATE 325 (65 FE) MG PO TABS
325.0000 mg | ORAL_TABLET | Freq: Two times a day (BID) | ORAL | Status: DC
  Administered 2021-11-05 (×2): 325 mg via ORAL
  Filled 2021-11-04 (×2): qty 1

## 2021-11-04 MED ORDER — POLYETHYLENE GLYCOL 3350 17 G PO PACK
17.0000 g | PACK | Freq: Every day | ORAL | Status: DC
  Administered 2021-11-05 – 2021-11-06 (×2): 17 g
  Filled 2021-11-04 (×2): qty 1

## 2021-11-04 MED ORDER — HYDROCHLOROTHIAZIDE 12.5 MG PO TABS
12.5000 mg | ORAL_TABLET | Freq: Every day | ORAL | Status: DC
  Administered 2021-11-05: 12.5 mg via ORAL
  Filled 2021-11-04 (×2): qty 1

## 2021-11-04 MED ORDER — IPRATROPIUM-ALBUTEROL 0.5-2.5 (3) MG/3ML IN SOLN: 3.0000 mL | Freq: Four times a day (QID) | RESPIRATORY_TRACT | Status: DC | PRN

## 2021-11-04 MED ORDER — LISINOPRIL 20 MG PO TABS
20.0000 mg | ORAL_TABLET | Freq: Every day | ORAL | Status: DC
  Administered 2021-11-05: 20 mg via ORAL
  Filled 2021-11-04: qty 1

## 2021-11-04 MED ORDER — BENZONATATE 100 MG PO CAPS
200.0000 mg | ORAL_CAPSULE | Freq: Three times a day (TID) | ORAL | Status: DC | PRN
  Administered 2021-11-04: 200 mg via ORAL
  Filled 2021-11-04: qty 2

## 2021-11-04 MED ORDER — FENTANYL 2500MCG IN NS 250ML (10MCG/ML) PREMIX INFUSION
25.0000 ug/h | INTRAVENOUS | Status: DC
  Administered 2021-11-04: 75 ug/h via INTRAVENOUS
  Filled 2021-11-04: qty 250

## 2021-11-04 MED ORDER — DOCUSATE SODIUM 50 MG/5ML PO LIQD
100.0000 mg | Freq: Two times a day (BID) | ORAL | Status: DC
  Administered 2021-11-04 – 2021-11-06 (×4): 100 mg
  Filled 2021-11-04 (×4): qty 10

## 2021-11-04 MED ORDER — CHLORHEXIDINE GLUCONATE CLOTH 2 % EX PADS
6.0000 | MEDICATED_PAD | Freq: Every day | CUTANEOUS | Status: DC
  Administered 2021-11-04 – 2021-11-06 (×3): 6 via TOPICAL

## 2021-11-04 MED ORDER — DEXTROSE 50 % IV SOLN
INTRAVENOUS | Status: AC
  Administered 2021-11-04: 50 mL
  Filled 2021-11-04: qty 50

## 2021-11-04 NOTE — Progress Notes (Signed)
Chaplain On-Call responded to a call from [employer], who reported that several members of the patient's family have arrived in the ICU Waiting Room.  Chaplain met the patient's brother and several nieces, and offered opportunities for story telling and sharing of their special relationships with the patient.  Chaplain provided spiritual and emotional support and prayer with them, and assured them of further availability as needed.  Chaplain Pollyann Samples M.Div., [facility]

## 2021-11-04 NOTE — Progress Notes (Signed)
PROGRESS NOTE    Kathryn Ware  IDP:[id number] DOB: Aug 12, 1941 DOA: 11/17/2021 PCP: Venia Carbon, MD   Assessment & Plan:   Principal Problem:   Acute on chronic diastolic CHF (congestive heart failure) ([place]) Active Problems:   Type 2 diabetes mellitus with hyperlipidemia (HCC)   Essential hypertension   Mild cognitive impairment   GI AVM (gastrointestinal arteriovenous vascular malformation)   Chronic blood loss anemia   Primary cancer of left upper lobe of lung (HCC)   CHF (congestive heart failure) (HCC)   Bilateral lower extremity edema   Hypertensive urgency   Hypertensive urgency: presented with blood pressure 200/72. Urgency resolved. Still w/ HTN. Continue on lisinopril, propranolol    Clinical hyperthyroidism: w/ bilateral lower extremity weakness with no neurological deficits, progressive weight loss and cachexia, chronic and persistent. Persistent sinus tachycardia. Started and will continue on methimazole. Will need to f/u outpatient w/ endo. Thyroid antibodies are WNL. Parotid biopsy recently done was benign.  Dementia: as per pt's sister.  Continue w/ supportive care    Chronic blood loss anemia: w/ hx of GI AVMs. H&H are labile    Underweight, protein calorie malnutrition: continue on nutritional supplements    DMII: HbA1c 5.5, well controlled. Continue on SSI w/ accuchecks    History of lung cancer: management per onco as an outpatient    Hypomagnesemia: within normal limits    Acute diastolic CHF: echo shows EF 44-31%, grade I diastolic dysfunction. S/p lasix x1. Monitor I/Os. Continue on lisinopril    DVT prophylaxis: SCDs Code Status: full  Family Communication:  discussed pt's care w/ pt's sisters and answered their questions  Disposition Plan: likely d/c home w/ HH   Level of care: Progressive  Status is: Inpatient  Remains inpatient appropriate because: severity of illness      Consultants:    Procedures:   Antimicrobials:    Subjective: Pt c/o malaise    Objective: Vitals:   11/04/21 0814 11/04/21 0843 11/04/21 0848 11/04/21 1149  BP: (!) 197/108  (!) 193/79 (!) 158/65  Pulse: (!) 105   89  Resp: 18   20  Temp: (!) 97.5 F (36.4 C)   98.1 F (36.7 C)  TempSrc: Oral   Oral  SpO2: (!) 89% 95% 95% 98%  Weight:      Height:        Intake/Output Summary (Last 24 hours) at 11/04/2021 1333 Last data filed at 11/04/2021 1000 Gross per 24 hour  Intake 0 ml  Output --  Net 0 ml   Filed Weights   10/31/2021 1441 11/04/21 0500  Weight: 39.5 kg 36.3 kg    Examination:  General exam: Appears uncomfortable. Cachetic appearing  Respiratory system: diminished breath sounds b/l  Cardiovascular system: S1/S2+. No rubs or clicks  Gastrointestinal system: Abd is soft, NT, ND & hypoactive bowel sounds  Central nervous system: Alert and oriented intermittently. Moves all extremities  Psychiatry: Judgement and insight appears poor. Flat mood and affect     Data Reviewed: I have personally reviewed following labs and imaging studies  CBC: Recent Labs  Lab 10/25/2021 1655 11/02/21 0614 11/03/21 0621 11/04/21 0705  WBC 3.4* 2.9* 5.1 4.0  NEUTROABS 2.1 2.3 3.8  --   HGB 9.3* 10.3* 11.3* 9.4*  HCT 29.8* 33.1* 36.1 30.2*  MCV 81.4 80.7 79.3* 80.3  PLT 226 267 337 540   Basic Metabolic Panel: Recent Labs  Lab 11/10/2021 1655 11/02/21 0614 11/03/21 0621 11/04/21 0705  NA 139 142 140  141  K 3.6 3.5 3.9 3.6  CL 101 100 97* 102  CO2 26 24 30 31   GLUCOSE 70 73 156* 158*  BUN 19 25* 52* 54*  CREATININE 0.63 0.78 1.01* 0.69  CALCIUM 9.7 10.5* 11.0* 10.6*  MG 1.3*  --  2.3 2.1  PHOS  --   --  5.3*  --    GFR: Estimated Creatinine Clearance: 32.7 mL/min (by C-G formula based on SCr of 0.69 mg/dL). Liver Function Tests: Recent Labs  Lab 11/04/2021 1655 11/04/21 0705  AST 29  --   ALT 35  --   ALKPHOS 57  --   BILITOT 0.5  --   PROT 6.4*  --   ALBUMIN 3.1* 3.2*   No results for input(s): LIPASE,  AMYLASE in the last 168 hours. No results for input(s): AMMONIA in the last 168 hours. Coagulation Profile: No results for input(s): INR, PROTIME in the last 168 hours. Cardiac Enzymes: No results for input(s): CKTOTAL, CKMB, CKMBINDEX, TROPONINI in the last 168 hours. BNP (last 3 results) No results for input(s): PROBNP in the last 8760 hours. HbA1C: Recent Labs    11/02/21 0614  HGBA1C 5.5   CBG: Recent Labs  Lab 11/03/21 1152 11/03/21 1752 11/03/21 2126 11/04/21 0812 11/04/21 1225  GLUCAP 177* 211* 188* 184* 242*   Lipid Profile: No results for input(s): CHOL, HDL, LDLCALC, TRIG, CHOLHDL, LDLDIRECT in the last 72 hours. Thyroid Function Tests: Recent Labs    11/02/21 0614  TSH <0.010*  FREET4 >5.50*  T3FREE 21.9*   Anemia Panel: No results for input(s): VITAMINB12, FOLATE, FERRITIN, TIBC, IRON, RETICCTPCT in the last 72 hours. Sepsis Labs: No results for input(s): PROCALCITON, LATICACIDVEN in the last 168 hours.  Recent Results (from the past 240 hour(s))  Resp Panel by RT-PCR (Flu A&B, Covid) Nasopharyngeal Swab     Status: None   Collection Time: 11/12/2021  9:07 PM   Specimen: Nasopharyngeal Swab; Nasopharyngeal(NP) swabs in vial transport medium  Result Value Ref Range Status   SARS Coronavirus 2 by RT PCR NEGATIVE NEGATIVE Final    Comment: (NOTE) SARS-CoV-2 target nucleic acids are NOT DETECTED.  The SARS-CoV-2 RNA is generally detectable in upper respiratory specimens during the acute phase of infection. The lowest concentration of SARS-CoV-2 viral copies this assay can detect is 138 copies/mL. A negative result does not preclude SARS-Cov-2 infection and should not be used as the sole basis for treatment or other patient management decisions. A negative result may occur with  improper specimen collection/handling, submission of specimen other than nasopharyngeal swab, presence of viral mutation(s) within the areas targeted by this assay, and inadequate  number of viral copies(<138 copies/mL). A negative result must be combined with clinical observations, patient history, and epidemiological information. The expected result is Negative.  Fact Sheet for Patients:  EntrepreneurPulse.com.au  Fact Sheet for Healthcare Providers:  IncredibleEmployment.be  This test is no t yet approved or cleared by the [place] FDA and  has been authorized for detection and/or diagnosis of SARS-CoV-2 by FDA under an Emergency Use Authorization (EUA). This EUA will remain  in effect (meaning this test can be used) for the duration of the COVID-19 declaration under Section 564(b)(1) of the Act, 21 U.S.C.section 360bbb-3(b)(1), unless the authorization is terminated  or revoked sooner.       Influenza A by PCR NEGATIVE NEGATIVE Final   Influenza B by PCR NEGATIVE NEGATIVE Final    Comment: (NOTE) The Xpert Xpress SARS-CoV-2/FLU/RSV plus assay is intended  as an aid in the diagnosis of influenza from Nasopharyngeal swab specimens and should not be used as a sole basis for treatment. Nasal washings and aspirates are unacceptable for Xpert Xpress SARS-CoV-2/FLU/RSV testing.  Fact Sheet for Patients: EntrepreneurPulse.com.au  Fact Sheet for Healthcare Providers: IncredibleEmployment.be  This test is not yet approved or cleared by the [place] FDA and has been authorized for detection and/or diagnosis of SARS-CoV-2 by FDA under an Emergency Use Authorization (EUA). This EUA will remain in effect (meaning this test can be used) for the duration of the COVID-19 declaration under Section 564(b)(1) of the Act, 21 U.S.C. section 360bbb-3(b)(1), unless the authorization is terminated or revoked.  Performed at [facility], [address], [place],  [address]          Radiology Studies: DG Chest [place] 1 View  Result Date: 11/04/2021 CLINICAL DATA:   Dyspnea EXAM: PORTABLE CHEST 1 VIEW COMPARISON:  Portable exam 0944 hours compared to 11/03/2021 FINDINGS: Normal heart size, mediastinal contours, and pulmonary vascularity. Atherosclerotic calcification aorta. Emphysematous changes with mild chronic accentuation of interstitial markings and lateral LEFT apical scarring. No acute infiltrate, pleural effusion, or pneumothorax. Bones demineralized with dextroconvex scoliosis and degenerative changes thoracolumbar spine. IMPRESSION: Changes of COPD with minimal chronic interstitial prominence and LEFT apical scarring. No acute abnormalities. Aortic Atherosclerosis (ICD10-I70.0) and Emphysema (ICD10-J43.9). Electronically Signed   By: Lavonia Dana M.D.   On: 11/04/2021 10:08   ECHOCARDIOGRAM COMPLETE  Result Date: 11/02/2021    ECHOCARDIOGRAM REPORT   Patient Name:   WENDIE DISKIN Date of Exam: 11/02/2021 Medical Rec #:  [id number]     Height:       65.0 in Accession #:    [id number]    Weight:       87.1 lb Date of Birth:  May 24, 1941     BSA:          1.389 m Patient Age:    81 years      BP:           152/65 mmHg Patient Gender: F             HR:           98 bpm. Exam Location:  [facility] Procedure: 2D Echo, Color Doppler and Cardiac Doppler Indications:     I50.31 congestive heart failure-Acute Diastolic  History:         Patient has prior history of Echocardiogram examinations. CHF,                  CAD, CKD, PAD and TIA; Risk Factors:Hypertension, Diabetes and                  Dyslipidemia.  Sonographer:     Charmayne Sheer Referring Phys:  [id number] Athena Masse Diagnosing Phys: [place]  1. Left ventricular ejection fraction, by estimation, is 60 to 65%. The left ventricle has normal function. The left ventricle has no regional wall motion abnormalities. There is severe concentric left ventricular hypertrophy. Left ventricular diastolic  parameters are consistent with Grade I diastolic dysfunction (impaired relaxation).  2. Right ventricular systolic  function is normal. The right ventricular size is normal.  3. Left atrial size was mild to moderately dilated.  4. The mitral valve is myxomatous. Mild mitral valve regurgitation. No evidence of mitral stenosis.  5. The aortic valve is normal in structure. Aortic valve regurgitation is not visualized. No aortic stenosis is present.  6. The inferior vena  cava is normal in size with greater than 50% respiratory variability, suggesting right atrial pressure of 3 mmHg. FINDINGS  Left Ventricle: Left ventricular ejection fraction, by estimation, is 60 to 65%. The left ventricle has normal function. The left ventricle has no regional wall motion abnormalities. The left ventricular internal cavity size was normal in size. There is  severe concentric left ventricular hypertrophy. Left ventricular diastolic parameters are consistent with Grade I diastolic dysfunction (impaired relaxation). Right Ventricle: The right ventricular size is normal. No increase in right ventricular wall thickness. Right ventricular systolic function is normal. Left Atrium: Left atrial size was mild to moderately dilated. Right Atrium: Right atrial size was normal in size. Pericardium: There is no evidence of pericardial effusion. Mitral Valve: The mitral valve is myxomatous. There is severe thickening of the mitral valve leaflet(s). Mild mitral annular calcification. Mild mitral valve regurgitation. No evidence of mitral valve stenosis. MV peak gradient, 8.5 mmHg. The mean mitral  valve gradient is 4.0 mmHg. Tricuspid Valve: The tricuspid valve is normal in structure. Tricuspid valve regurgitation is mild . No evidence of tricuspid stenosis. Aortic Valve: The aortic valve is normal in structure. Aortic valve regurgitation is not visualized. No aortic stenosis is present. Aortic valve mean gradient measures 5.0 mmHg. Aortic valve peak gradient measures 8.2 mmHg. Aortic valve area, by VTI measures 3.26 cm. Pulmonic Valve: The pulmonic valve was  normal in structure. Pulmonic valve regurgitation is not visualized. No evidence of pulmonic stenosis. Aorta: The aortic root is normal in size and structure. Venous: The inferior vena cava is normal in size with greater than 50% respiratory variability, suggesting right atrial pressure of 3 mmHg. IAS/Shunts: No atrial level shunt detected by color flow Doppler.  LEFT VENTRICLE PLAX 2D LVIDd:         3.13 cm   Diastology LVIDs:         2.05 cm   LV e' medial:    4.03 cm/s LV PW:         1.02 cm   LV E/e' medial:  29.3 LV IVS:        0.62 cm   LV e' lateral:   4.35 cm/s LVOT diam:     2.20 cm   LV E/e' lateral: 27.1 LV SV:         84 LV SV Index:   60 LVOT Area:     3.80 cm  RIGHT VENTRICLE RV Basal diam:  1.70 cm LEFT ATRIUM             Index        RIGHT ATRIUM          Index LA diam:        2.40 cm 1.73 cm/m   RA Area:     4.13 cm LA Vol (A2C):   25.3 ml 18.21 ml/m  RA Volume:   4.40 ml  3.17 ml/m LA Vol (A4C):   31.0 ml 22.31 ml/m LA Biplane Vol: 27.6 ml 19.87 ml/m  AORTIC VALVE                     PULMONIC VALVE AV Area (Vmax):    3.08 cm      PV Vmax:       1.26 m/s AV Area (Vmean):   3.01 cm      PV Vmean:      85.200 cm/s AV Area (VTI):     3.26 cm      PV VTI:  0.163 m AV Vmax:           143.00 cm/s   PV Peak grad:  6.4 mmHg AV Vmean:          103.000 cm/s  PV Mean grad:  3.0 mmHg AV VTI:            0.258 m AV Peak Grad:      8.2 mmHg AV Mean Grad:      5.0 mmHg LVOT Vmax:         116.00 cm/s LVOT Vmean:        81.500 cm/s LVOT VTI:          0.221 m LVOT/AV VTI ratio: 0.86  AORTA Ao Root diam: 2.70 cm MITRAL VALVE MV Area (PHT): 6.60 cm     SHUNTS MV Area VTI:   3.23 cm     Systemic VTI:  0.22 m MV Peak grad:  8.5 mmHg     Systemic Diam: 2.20 cm MV Mean grad:  4.0 mmHg MV Vmax:       1.46 m/s MV Vmean:      99.6 cm/s MV Decel Time: 115 msec MV E velocity: 118.00 cm/s MV A velocity: 176.00 cm/s MV E/A ratio:  0.67 Shaukat Khan Electronically signed by Neoma Laming Signature Date/Time:  11/02/2021/9:24:38 PM    Final    US THYROID  Result Date: 11/02/2021 CLINICAL DATA:  Hyperthyroid. EXAM: THYROID ULTRASOUND TECHNIQUE: Ultrasound examination of the thyroid gland and adjacent soft tissues was performed. COMPARISON:  None. FINDINGS: Parenchymal Echotexture: Markedly heterogenous. Gland vascularity within normal limits. Isthmus: 0.2 cm Right lobe: 6.0 x 2.4 x 1.9 cm Left lobe: 6.0 x 2.1 x 1.8 cm _________________________________________________________ Estimated total number of nodules >/= 1 cm: 4 Number of spongiform nodules >/=  2 cm not described below (TR1): 0 Number of mixed cystic and solid nodules >/= 1.5 cm not described below (TR2): 0 _________________________________________________________ Nodule labeled 1 is a solid hypoechoic nodule (TR 4) in the superior right thyroid lobe measuring 1.1 x 1.0 x 0.5 cm. *Given size (>/= 1 - 1.4 cm) and appearance, a follow-up ultrasound in 1 year should be considered based on TI-RADS criteria. Nodule labeled 2 is a solid hypoechoic nodule (TR 4) in the mid right thyroid lobe measuring 1.1 x 1.1 x 0.9 cm. *Given size (>/= 1 - 1.4 cm) and appearance, a follow-up ultrasound in 1 year should be considered based on TI-RADS criteria. Nodule labeled 3 is a mixed solid and cystic hypoechoic nodule (TR 3) in the inferior left thyroid lobe measuring 1.1 x 1.1 x 0.9 cm. Given size (<1.4 cm) and appearance, this nodule does NOT meet TI-RADS criteria for biopsy or dedicated follow-up. Nodule labeled 4 is a solid hypoechoic nodule (TR 4) in the mid left thyroid lobe measuring 1.0 x 0.9 x 0.5 cm. *Given size (>/= 1 - 1.4 cm) and appearance, a follow-up ultrasound in 1 year should be considered based on TI-RADS criteria. IMPRESSION: 1. Enlarged, heterogeneous multinodular thyroid gland. Thyroid gland vascularity appears within normal limits. 2. Nodules labeled 1, 2 and 4 meet criteria for follow-up ultrasound in 1 year. The above is in keeping with the ACR TI-RADS  recommendations - J Am Coll Radiol 2017;14:587-595. Electronically Signed   By: Albin Felling M.D.   On: 11/02/2021 14:13        Scheduled Meds:  feeding supplement  237 mL Oral TID BM   guaiFENesin  600 mg Oral BID   insulin aspart  0-5  Units Subcutaneous QHS   insulin aspart  0-9 Units Subcutaneous TID WC   lisinopril  10 mg Oral Daily   methimazole  5 mg Oral Daily   multivitamin with minerals  1 tablet Oral Daily   nitroGLYCERIN  1 inch Topical Q6H   propranolol  40 mg Oral BID   sodium chloride flush  3 mL Intravenous Q12H   Continuous Infusions:  sodium chloride       LOS: 3 days    Time spent: 25 mins     Wyvonnia Dusky, MD [facility] Pager [phone]  If 7PM-7AM, please contact night-coverage 11/04/2021, 1:33 PM

## 2021-11-04 NOTE — Progress Notes (Signed)
Occupational Therapy Treatment Patient Details Name: Kathryn Ware MRN: [id number] DOB: 31-Aug-1941 Today's Date: 11/04/2021   History of present illness 81 y.o. female with medical history significant for DM, HTN, gastric AVMs with history of chronic blood loss anemia, on weekly iron transfusions by hematology, mild cognitive deficit, stage I NSCLC completed XRT 01/12/2021, who presents to the ED for evaluation of lower extremity weakness leading to difficulty with ambulation.   OT comments  Ms Bottari was seen for OT treatment on this date. Upon arrival to room pt sidelying in bed with coffee spilled across self/linens. Poor awareness and lethargy, improves with mobility efforts and responds to 1 step commands. SUPERVISION rolling and MIN A + HHA sit<>stand at EOB x2 trials for linen change. Left with clean bed, bed alarm set. Progressing towards goals. Pt continues to benefit from skilled OT services to maximize return to PLOF and minimize risk of future falls, injury, caregiver burden, and readmission. Will continue to follow POC. Discharge recommendation remains appropriate.     Recommendations for follow up therapy are one component of a multi-disciplinary discharge planning process, led by the attending physician.  Recommendations may be updated based on patient status, additional functional criteria and insurance authorization.    Follow Up Recommendations  Home health OT    Assistance Recommended at Discharge Intermittent Supervision/Assistance  Patient can return home with the following  A little help with walking and/or transfers;A little help with bathing/dressing/bathroom   Equipment Recommendations  None recommended by OT    Recommendations for Other Services      Precautions / Restrictions Precautions Precautions: Fall Restrictions Weight Bearing Restrictions: No       Mobility Bed Mobility Overal bed mobility: Needs Assistance Bed Mobility: Supine to Sit     Supine  to sit: Supervision;HOB elevated Sit to supine: Min assist   General bed mobility comments: assistance for LE support to return to bed. verbal cues for task initiation    Transfers Overall transfer level: Needs assistance Equipment used: 1 person hand held assist Transfers: Sit to/from Stand Sit to Stand: Min assist           General transfer comment: lifting assistance required to stand     Balance Overall balance assessment: Needs assistance Sitting-balance support: Feet supported Sitting balance-Leahy Scale: Good     Standing balance support: Single extremity supported Standing balance-Leahy Scale: Fair Standing balance comment: Min A required for safety. would likely have improved balance with use of rolling walker for support                           ADL either performed or assessed with clinical judgement   ADL Overall ADL's : Needs assistance/impaired                                       General ADL Comments: MIN A + HHA for ADL t/f. MIN A adjust briefs at bed level      Cognition Arousal/Alertness: Awake/alert;Lethargic Behavior During Therapy: WFL for tasks assessed/performed Overall Cognitive Status: Within Functional Limits for tasks assessed                                 General Comments: patient lethargic but wakes up with verbal stimulation and remains alert with mobility efforts. increased time  required, but patient able to follow single step commands with extra time                General Comments heart rate 90's-110's with activity.     Pertinent Vitals/ Pain       Pain Assessment: No/denies pain   Frequency  Min 2X/week        Progress Toward Goals  OT Goals(current goals can now be found in the care plan section)  Progress towards OT goals: Progressing toward goals  Acute Rehab OT Goals Patient Stated Goal: none stated OT Goal Formulation: With patient Time For Goal Achievement:  11/16/21 Potential to Achieve Goals: Good ADL Goals Pt Will Perform Grooming: with modified independence;standing Pt Will Perform Lower Body Dressing: with modified independence;sit to/from stand Pt Will Transfer to Toilet: with modified independence;ambulating;regular height toilet  Plan Discharge plan remains appropriate;Frequency remains appropriate    Co-evaluation                 AM-PAC OT "6 Clicks" Daily Activity     Outcome Measure   Help from another person eating meals?: None Help from another person taking care of personal grooming?: A Little Help from another person toileting, which includes using toliet, bedpan, or urinal?: A Little Help from another person bathing (including washing, rinsing, drying)?: A Little Help from another person to put on and taking off regular upper body clothing?: None Help from another person to put on and taking off regular lower body clothing?: A Little 6 Click Score: 20    End of Session Equipment Utilized During Treatment: Oxygen  OT Visit Diagnosis: Unsteadiness on feet (R26.81);Muscle weakness (generalized) (M62.81)   Activity Tolerance Patient tolerated treatment well   Patient Left in bed;with call bell/phone within reach;with bed alarm set   Nurse Communication          Time: 3016-0109 OT Time Calculation (min): 14 min  Charges: OT General Charges $OT Visit: 1 Visit OT Treatments $Self Care/Home Management : 8-22 mins  Dessie Coma, M.S. OTR/L  11/04/21, 4:18 PM  ascom [phone]

## 2021-11-04 NOTE — Progress Notes (Signed)
Mobility Specialist - Progress Note   11/04/21 1400  Mobility  Activity Transferred to/from [facility]  Level of Assistance Minimal assist, patient does 75% or more  Assistive Device Front wheel walker;BSC  Distance Ambulated (ft) 2 ft  Mobility Out of bed for toileting  Mobility Response Tolerated well  Mobility performed by Mobility specialist  $Mobility charge 1 Mobility     Mobility entered as bed alarm began to ring. Pt sitting EOB on arrival, stating she needs to go to the bathroom. Poor safety awareness, vc for waiting til safe to transfer (BSC in place, RW in place, etc). Pt took several small steps to [facility] where she had large BM. Steadying assist. NT entered to assist with bed linen change d/t soiled sheets. Supervision for seated peri-care, minA for more thorough cleaning in standing. VC for leaving [place] in place as pt attempts to remove several times throughout session. O2 desat to 72% on 1L---increased to 4L to achieve saturations > 88%. Pt coughing up phlegm, requesting cough drops. Pt left in bed with alarm set, needs in reach. O2 92% at exit, left on 4L. HR 103 bpm. RN notified.    Kathee Delton Mobility Specialist 11/04/21, 2:43 PM

## 2021-11-04 NOTE — Progress Notes (Signed)
Initial Nutrition Assessment  DOCUMENTATION CODES:   Underweight, Severe malnutrition in context of chronic illness  INTERVENTION:   -Continue Ensure Enlive po TID, each supplement provides 350 kcal and 20 grams of protein  -Continue MVI with minerals daily -Downgrade diet to dysphagia 3 diet for ease of intake (pt with poor fitting dentures) -Feeding assistance with meals  NUTRITION DIAGNOSIS:   Severe Malnutrition related to chronic illness (CHF) as evidenced by moderate fat depletion, severe fat depletion, moderate muscle depletion, severe muscle depletion.  Ongoing  GOAL:   Patient will meet greater than or equal to 90% of their needs  Progressing   MONITOR:   PO intake, Supplement acceptance, Labs, Weight trends, Skin, I & O's  REASON FOR ASSESSMENT:   Consult Assessment of nutrition requirement/status  ASSESSMENT:   Kathryn Ware is a 81 y.o. female with medical history significant for DM, HTN, gastric AVMs with history of chronic blood loss anemia, on weekly iron transfusions by hematology, mild cognitive deficit, stage I NSCLC completed XRT 01/12/2021, who presents to the ED for evaluation of lower extremity weakness, feeling like her legs giving out, making it difficult to ambulate, causing her to fall a couple times but without injury.  She denies shortness of breath, denies orthopnea, chest pain, and denies cough fever or chills.  Reviewed I/O's: 0 ml x 24 hours   Spoke with pt and sister at bedside. Pt kept falling asleep during interview and had to be awoken several times. Per sister, pt has a good appetite until about a month ago (at baseline she consumes 3 meals per day). Pt consuming very little; pt has no teeth, but her top dentures broke yesterday. She typically does not have any difficulty chewing food with her dentures. RD will downgrade to a mechanically altered diet for ease of intake. Pt has been requiring feeding assistance and encouragement from staff.    Reviewed wt hx; pt has experienced a 7.9% wt loss over the past 6 months.  Labs reviewed: CBGS: [id number] (inpatient orders for glycemic control are 0-5 units insulin aspart daily at bedtime and 0.9 units insulin aspart TID with meals).    NUTRITION - FOCUSED PHYSICAL EXAM:  Flowsheet Row Most Recent Value  Orbital Region Moderate depletion  Upper Arm Region Severe depletion  Thoracic and Lumbar Region Severe depletion  Buccal Region Severe depletion  Temple Region Severe depletion  Clavicle Bone Region Severe depletion  Clavicle and Acromion Bone Region Severe depletion  Scapular Bone Region Severe depletion  Dorsal Hand Moderate depletion  Patellar Region Severe depletion  Anterior Thigh Region Severe depletion  Posterior Calf Region Severe depletion  Edema (RD Assessment) None  Hair Reviewed  Eyes Reviewed  Mouth Reviewed  Skin Reviewed  Nails Reviewed       Diet Order:   Diet Order             DIET DYS 3 Room service appropriate? Yes; Fluid consistency: Thin  Diet effective now                   EDUCATION NEEDS:   No education needs have been identified at this time  Skin:  Skin Assessment: Reviewed RN Assessment  Last BM:  11/02/21  Height:   Ht Readings from Last 1 Encounters:  11/04/2021 5\' 5"  (1.651 m)    Weight:   Wt Readings from Last 1 Encounters:  11/04/21 36.3 kg    Ideal Body Weight:  56.8 kg  BMI:  Body mass index  is 13.32 kg/m.  Estimated Nutritional Needs:   Kcal:  1550-1750  Protein:  80-95 grams  Fluid:  > 1.5 L    Loistine Chance, RD, LDN, [place] Registered Dietitian II Certified Diabetes Care and Education Specialist Please refer to [facility] for RD and/or RD on-call/weekend/after hours pager

## 2021-11-04 NOTE — TOC Initial Note (Signed)
Transition of Care [facility]) - Initial/Assessment Note    Patient Details  Name: Kathryn Ware MRN: [id number] Date of Birth: 1941/01/27  Transition of Care [facility]) CM/SW Contact:    Alberteen Sam, LCSW Phone Number: 11/04/2021, 2:52 PM  Clinical Narrative:                  CSW spoke with patient's sister Freda Munro who confirms they are in agreement with patient receiving home health PT and RN at time of discharge with no preference of agency.   CSW has sent referral to [facility] with [facility] for home health.   Freda Munro requests rolling walker to be delivered to hospital room via [employer] pending DME orders and closer to discharge time frame.     Expected Discharge Plan: [place] Barriers to Discharge: Continued Medical Work up   Patient Goals and CMS Choice Patient states their goals for this hospitalization and ongoing recovery are:: to go home CMS Medicare.gov Compare Post Acute Care list provided to:: Patient Represenative (must comment) (sister Freda Munro) Choice offered to / list presented to : Sibling  Expected Discharge Plan and Services Expected Discharge Plan: [place]                                              Prior Living Arrangements/Services                       Activities of Daily Living Home Assistive Devices/Equipment: Eyeglasses, Environmental consultant (specify type) ADL Screening (condition at time of admission) Patient's cognitive ability adequate to safely complete daily activities?: No Is the patient deaf or have difficulty hearing?: No Does the patient have difficulty seeing, even when wearing glasses/contacts?: No Does the patient have difficulty concentrating, remembering, or making decisions?: No Patient able to express need for assistance with ADLs?: Yes Does the patient have difficulty dressing or bathing?: Yes Independently performs ADLs?: No Communication: Appropriate for developmental age Dressing (OT): Needs  assistance Is this a change from baseline?: Change from baseline, expected to last >3 days Grooming: Needs assistance Is this a change from baseline?: Change from baseline, expected to last >3 days Feeding: Needs assistance Is this a change from baseline?: Change from baseline, expected to last >3 days Bathing: Needs assistance Is this a change from baseline?: Change from baseline, expected to last >3 days Toileting: Needs assistance Is this a change from baseline?: Change from baseline, expected to last >3days In/Out Bed: Needs assistance Is this a change from baseline?: Change from baseline, expected to last >3 days Walks in Home: Needs assistance Is this a change from baseline?: Change from baseline, expected to last >3 days Does the patient have difficulty walking or climbing stairs?: Yes Weakness of Legs: Both Weakness of Arms/Hands: None  Permission Sought/Granted                  Emotional Assessment         Alcohol / Substance Use: Not Applicable Psych Involvement: No (comment)  Admission diagnosis:  CHF (congestive heart failure) (HCC) [I50.9] Hyperthyroidism [E05.90] Bilateral lower extremity edema [R60.0] Patient Active Problem List   Diagnosis Date Noted   Hyperthyroidism    Dementia with behavioral disturbance    CHF (congestive heart failure) ([place]) 11/04/2021   Acute on chronic diastolic CHF (congestive heart failure) ([place])  Bilateral lower extremity edema    Hypertensive urgency    Iron deficiency anemia 06/10/2021   Parotid nodule 06/10/2021   Malignant neoplasm of lung ([place]) 06/10/2021   Primary cancer of left upper lobe of lung ([place]) 12/10/2020   Tachycardia    GI AVM (gastrointestinal arteriovenous vascular malformation) 08/22/2020   Chronic blood loss anemia 08/22/2020   Angiodysplasia of stomach and duodenum with hemorrhage    Gastritis with hemorrhage    Weakness    GI bleeding 06/24/2020   Symptomatic anemia 01/05/2020   HLD  (hyperlipidemia) 01/05/2020   GERD (gastroesophageal reflux disease) 01/05/2020   Anxiety 01/05/2020   Chronic renal disease, stage III ([place]) 01/05/2020   Personal history of tobacco use, presenting hazards to health 01/05/2020   Syncope 01/05/2020   GIB (gastrointestinal bleeding) 01/05/2020   PAD (peripheral artery disease) ([place]) 09/12/2019   Chronic bronchitis ([place]) 12/20/2018   Aortic atherosclerosis ([place]) 12/19/2017   Chronic diastolic CHF (congestive heart failure) ([place]) 05/31/2017   Malnutrition of mild degree ([place]) 03/30/2017   History of adenomatous polyp of colon 09/19/2016   Anemia due to chronic blood loss 07/05/2016   Restless legs syndrome (RLS) 02/23/2016   Duodenal arteriovenous malformation 03/19/2015   AKI (acute kidney injury) ([place]) 03/19/2015   Congenital gastrointestinal vessel anomaly 03/19/2015   Acute on chronic blood loss anemia 02/18/2015   Advance directive discussed with patient 02/18/2015   Nicotine dependence 10/29/2013   Routine general medical examination at a health care facility 08/17/2011   Mild cognitive impairment 08/17/2011   Type 2 diabetes mellitus with hyperlipidemia ([place]) 02/02/2007   HYPERCHOLESTEROLEMIA 02/02/2007   Essential hypertension 02/02/2007   Angiodysplasia of small intestine ([place]) 02/02/2007   Benign essential hypertension 02/02/2007   PCP:  Venia Carbon, MD Pharmacy:   [facility] [phone] - [address], [address] AT [address] [place] [address] Phone: [phone] Fax: [phone]     Social Determinants of Health (SDOH) Interventions    Readmission Risk Interventions No flowsheet data found.

## 2021-11-04 NOTE — Consult Note (Addendum)
° °  Heart Failure Nurse Navigator Note  HFpEF 60 to 65%.  Grade 1 diastolic dysfunction mild to moderate left atrial enlargement.  She presented to the emergency room with complaints of lower extremity weakness feeling like her legs were going to give out.  BNP was elevated at 1040.  Chest x-ray revealed mild interstitial pulmonary edema.  Her blood pressure was elevated at 200/72.  Comorbidities:  Diabetes Hypertension Gastric AVM/anemia Mild cognitive deficit  Labs:  Sodium 141, potassium 3.6, chloride 102, CO2 31, BUN 54, creatinine 0.69, magnesium 21, albumin 3.2 Intake not documented Output not documented Blood pressure 158/65 Weight 36.3  Medications:  Lisinopril 10 mg daily Inderal 40 mg 2 times a day   She received furosemide 40 mg IV x1   Initially met with the sister that she does not live with.  Went over the main steps in taking care of patient with heart failure.  He asked that I speak with sister that lives with the patient.  Told her I would gladly come back.  Met with KK, she lives with the patient, she is the younger sister.  She states that she is the one in charge of preparing the meals.  Discussed importance of low-sodium diet, maintain less than 2000 mg of sodium daily.  Discussed the types of foods that she likes to eat at this time.  She states that her sister likes to have sausage gravy and egg biscuits every morning for breakfast.  They do use canned soups.  Talked about using lean meats, fresh and frozen vegetables and fruits.  And sticking with the fluid restriction less than 64 ounces daily.  Discussed daily weights and recording all, also went over the different zones and what to report and document.  Also encouraged cessation of of her tobacco use.  Also went over follow-up in the outpatient heart failure clinic, she has an appointment on January 23 at 1230.  She has a no-show ratio of 8%.  Which is 9 out of 110 appointments  Were given the  living with heart failure teaching booklet along with his zone magnet and information on low-sodium.  Pricilla Riffle RN CHFN

## 2021-11-04 NOTE — Progress Notes (Signed)
PT Cancellation Note  Patient Details Name: Kathryn Ware MRN: [id number] DOB: April 01, 1941   Cancelled Treatment:    Reason Eval/Treat Not Completed:  (Per chart review, patient now intubated/sedated s/p CODE BLUE.  Per guidelines, will require new PT/OT orders to resume services after change in medical status/transfer to higher level of care.  Please reconsult as medically appropriate for PT/OT)   Jaslene Marsteller H. Mcclay Shark, PT, DPT, NCS 11/04/21, 9:24 PM [phone]

## 2021-11-04 NOTE — Care Management Important Message (Signed)
Important Message  Patient Details  Name: Kathryn Ware MRN: [id number] Date of Birth: [birth year]   Medicare Important Message Given:  Yes     Dannette Barbara 11/04/2021, 1:00 PM

## 2021-11-04 NOTE — Progress Notes (Signed)
Physical Therapy Treatment Patient Details Name: Kathryn Ware MRN: [id number] DOB: 10-25-1940 Today's Date: 11/04/2021   History of Present Illness 81 y.o. female with medical history significant for DM, HTN, gastric AVMs with history of chronic blood loss anemia, on weekly iron transfusions by hematology, mild cognitive deficit, stage I NSCLC completed XRT 01/12/2021, who presents to the ED for evaluation of lower extremity weakness leading to difficulty with ambulation.    PT Comments    Patient sleeping soundly on arrival to room. There was a sister at the bedside (she does not live with the patient but reports the patient will have assistance from family at discharge). Patient was is more alert with stimulation and with increased mobility. The patient needs assistance for standing and to take a few steps in the room. She has generalized weakness and limited overall activity tolerance. Recommend to continue PT to maximize independence and decrease caregiver burden. Anticipate patient can return home if she has assistance for mobility, otherwise she would need SNF.     Recommendations for follow up therapy are one component of a multi-disciplinary discharge planning process, led by the attending physician.  Recommendations may be updated based on patient status, additional functional criteria and insurance authorization.  Follow Up Recommendations  Home health PT     Assistance Recommended at Discharge Frequent or constant Supervision/Assistance  Patient can return home with the following A little help with walking and/or transfers;A little help with bathing/dressing/bathroom;Assistance with cooking/housework;Assist for transportation;Help with stairs or ramp for entrance   Equipment Recommendations  Rolling walker (2 wheels)    Recommendations for Other Services       Precautions / Restrictions Precautions Precautions: Fall Restrictions Weight Bearing Restrictions: No      Mobility  Bed Mobility Overal bed mobility: Needs Assistance Bed Mobility: Supine to Sit     Supine to sit: Supervision;HOB elevated Sit to supine: Min assist   General bed mobility comments: assistance for LE support to return to bed. verbal cues for task initiation    Transfers Overall transfer level: Needs assistance Equipment used: 1 person hand held assist Transfers: Sit to/from Stand Sit to Stand: Min assist           General transfer comment: lifting assistance required to stand    Ambulation/Gait Ambulation/Gait assistance: Min assist Gait Distance (Feet): 2 Feet Assistive device: 1 person hand held assist Gait Pattern/deviations: Narrow base of support Gait velocity: decreased     General Gait Details: patient initially has difficulty weight shifting. steadying assistance provided. patient takes several side steps along edge of bed with cues for technique and task initiation. standing activity tolerance limited by fatigue   Stairs             Wheelchair Mobility    Modified Rankin (Stroke Patients Only)       Balance Overall balance assessment: Needs assistance Sitting-balance support: Feet supported Sitting balance-Leahy Scale: Good     Standing balance support: During functional activity;No upper extremity supported Standing balance-Leahy Scale: Fair Standing balance comment: Min A required for safety. would likely have improved balance with use of rolling walker for support                            Cognition Arousal/Alertness: Awake/alert;Lethargic Behavior During Therapy: WFL for tasks assessed/performed Overall Cognitive Status: Within Functional Limits for tasks assessed  General Comments: patient lethargic but wakes up with verbal stimulation and remains alert with mobility efforts. increased time required, but patient able to follow single step commands with extra time         Exercises      General Comments General comments (skin integrity, edema, etc.): heart rate 90's-110's with activity. patient takes several sips of ensure and of water during session      Pertinent Vitals/Pain Pain Assessment: No/denies pain    Home Living                          Prior Function            PT Goals (current goals can now be found in the care plan section) Acute Rehab PT Goals Patient Stated Goal: to go home PT Goal Formulation: With patient Time For Goal Achievement: 11/16/21 Potential to Achieve Goals: Fair Progress towards PT goals: Progressing toward goals    Frequency    Min 2X/week      PT Plan Current plan remains appropriate    Co-evaluation              AM-PAC PT "6 Clicks" Mobility   Outcome Measure  Help needed turning from your back to your side while in a flat bed without using bedrails?: None Help needed moving from lying on your back to sitting on the side of a flat bed without using bedrails?: A Little Help needed moving to and from a bed to a chair (including a wheelchair)?: A Little Help needed standing up from a chair using your arms (e.g., wheelchair or bedside chair)?: A Little Help needed to walk in hospital room?: A Little Help needed climbing 3-5 steps with a railing? : A Lot 6 Click Score: 18    End of Session   Activity Tolerance: Patient tolerated treatment well;Patient limited by fatigue Patient left: in bed;with call bell/phone within reach;with family/visitor present   PT Visit Diagnosis: Unsteadiness on feet (R26.81);Other abnormalities of gait and mobility (R26.89);Muscle weakness (generalized) (M62.81);History of falling (Z91.81);Difficulty in walking, not elsewhere classified (R26.2)     Time: 1105-1130 PT Time Calculation (min) (ACUTE ONLY): 25 min  Charges:  $Gait Training: 8-22 mins $Therapeutic Activity: 8-22 mins                     Kathryn Ware, PT, MPT   [name] 11/04/2021, 1:13 PM

## 2021-11-04 NOTE — Consult Note (Signed)
NAME:  MARIETTE COWLEY, MRN:  [id number], DOB:  01-23-1941, LOS: 3 ADMISSION DATE:  11/08/2021 HPI 81 y.o. female with medical history significant for DM, HTN, gastric AVMs with history of chronic blood loss anemia, on weekly iron transfusions by hematology, mild cognitive deficit, stage I NSCLC completed XRT 01/12/2021,   who presents to the ED for evaluation of lower extremity weakness, feeling like her legs giving out,  making it difficult to ambulate, causing her to fall a couple times but without injury.   She denies shortness of breath, denies orthopnea, chest pain, and denies cough fever or chills.     ED course:  Afebrile, BP up to 200/72 with pulse 119-131, O2 sat 94 to 99% on room air Blood work with troponin 31-32, BNP 1040 Hemoglobin 9.3 Magnesium 1.3     Chest x-ray: Increased mild bilateral interstitial thickening compatible with mild interstitial pulmonary edema Chronic moderate to high-grade hyperinflation compatible with COPD   Patient treated with Nitropaste and IV Lasix.  Hospitalist consulted for admission.  Acute cardiac arrest on 1/12  Significant Hospital Events: Including procedures, antibiotic start and stop dates in addition to other pertinent events   1/9 admitted for acute [facility] 1/12 transferred to ICU for acute cardiac arrest 6 mins CPR     Antimicrobials:   Antibiotics Given (last 72 hours)     None               Objective   Blood pressure (!) 253/84, pulse (!) 127, temperature 97.7 F (36.5 C), resp. rate 20, height '5\' 5"'  (1.651 m), weight 36.3 kg, SpO2 92 %.        Intake/Output Summary (Last 24 hours) at 11/04/2021 1731 Last data filed at 11/04/2021 1000 Gross per 24 hour  Intake 0 ml  Output --  Net 0 ml   Filed Weights   10/31/2021 1441 11/04/21 0500  Weight: 39.5 kg 36.3 kg      REVIEW OF SYSTEMS  PATIENT IS UNABLE TO PROVIDE COMPLETE REVIEW OF SYSTEMS DUE TO SEVERE CRITICAL ILLNESS AND TOXIC METABOLIC  ENCEPHALOPATHY   PHYSICAL EXAMINATION:  GENERAL:critically ill appearing, +resp distress severely malnourished EYES: Pupils equal, round, reactive to light.  No scleral icterus.  MOUTH: Moist mucosal membrane. INTUBATED NECK: Supple.  PULMONARY: +rhonchi, +wheezing CARDIOVASCULAR: S1 and S2.  No murmurs  GASTROINTESTINAL: Soft, nontender, -distended. Positive bowel sounds.  MUSCULOSKELETAL: No swelling, clubbing, or edema.  NEUROLOGIC: obtunded SKIN:intact,warm,dry     Labs/imaging that I havepersonally reviewed  (right click and "Reselect all SmartList Selections" daily)       ASSESSMENT AND PLAN SYNOPSIS  81 yo with acute dCHF with l;ung cancer with COPD with acute sudden cardiac arrest with NSTEMI  Severe ACUTE Hypoxic and Hypercapnic Respiratory Failure -continue Mechanical Ventilator support -continue Bronchodilator Therapy -Wean Fio2 and PEEP as tolerated -VAP/VENT bundle implementation -will NOT perform SAT/SBT when respiratory parameters are met      SEVERE COPD EXACERBATION -continue IV steroids as prescribed -continue NEB THERAPY as prescribed -morphine as needed -wean fio2 as needed and tolerated   CARDIAC FAILURE-acute diastolic dysfunction Acute diastolic CHF: echo shows EF 24-49%, grade I diastolic dysfunction.  -oxygen as needed -Lasix as tolerated -follow up cardiac enzymes as indicated   CARDIAC ICU monitoring   KIDNEY -continue Foley Catheter-assess need -Avoid nephrotoxic agents -Follow urine output, BMP -Ensure adequate renal perfusion, optimize oxygenation -Renal dose medications   Intake/Output Summary (Last 24 hours) at 11/04/2021 1731 Last data filed at 11/04/2021 1000  Gross per 24 hour  Intake 0 ml  Output --  Net 0 ml   BMP Latest Ref Rng & Units 11/04/2021 11/03/2021 11/02/2021  Glucose 70 - 99 mg/dL 158(H) 156(H) 73  BUN 8 - 23 mg/dL 54(H) 52(H) 25(H)  Creatinine 0.44 - 1.00 mg/dL 0.69 1.01(H) 0.78  Sodium 135 - 145  mmol/L 141 140 142  Potassium 3.5 - 5.1 mmol/L 3.6 3.9 3.5  Chloride 98 - 111 mmol/L 102 97(L) 100  CO2 22 - 32 mmol/L '31 30 24  ' Calcium 8.9 - 10.3 mg/dL 10.6(H) 11.0(H) 10.5(H)      NEUROLOGY Acute toxic metabolic encephalopathy, need for sedation Goal RASS -2 to -3   CARDIAC ARREST AND SHOCK SOURCE-dCHF -use vasopressors to keep MAP>65 as needed -follow ABG and LA   ENDO - ICU hypoglycemic\Hyperglycemia protocol -check FSBS per protocol   GI GI PROPHYLAXIS as indicated  NUTRITIONAL STATUS DIET-->NPO Constipation protocol as indicated   ELECTROLYTES -follow labs as needed -replace as needed -pharmacy consultation and following   ACUTE ANEMIA- TRANSFUSE AS NEEDED CONSIDER TRANSFUSION  IF HGB<7 DVT PRX with TED/SCD's ONLY     Best practice (right click and "Reselect all SmartList Selections" daily)  Diet: NPO Pain/Anxiety/Delirium protocol (if indicated): Yes (RASS goal 0) VAP protocol (if indicated): Yes DVT prophylaxis: Subcutaneous Heparin GI prophylaxis: PPI Foley:  N/A Mobility:  bed rest  Code Status:  FULL Disposition:ICU  Labs   CBC: Recent Labs  Lab 11/08/2021 1655 11/02/21 0614 11/03/21 0621 11/04/21 0705  WBC 3.4* 2.9* 5.1 4.0  NEUTROABS 2.1 2.3 3.8  --   HGB 9.3* 10.3* 11.3* 9.4*  HCT 29.8* 33.1* 36.1 30.2*  MCV 81.4 80.7 79.3* 80.3  PLT 226 267 337 786    Basic Metabolic Panel: Recent Labs  Lab 11/20/2021 1655 11/02/21 0614 11/03/21 0621 11/04/21 0705  NA 139 142 140 141  K 3.6 3.5 3.9 3.6  CL 101 100 97* 102  CO2 '26 24 30 31  ' GLUCOSE 70 73 156* 158*  BUN 19 25* 52* 54*  CREATININE 0.63 0.78 1.01* 0.69  CALCIUM 9.7 10.5* 11.0* 10.6*  MG 1.3*  --  2.3 2.1  PHOS  --   --  5.3*  --    GFR: Estimated Creatinine Clearance: 32.7 mL/min (by C-G formula based on SCr of 0.69 mg/dL). Recent Labs  Lab 11/05/2021 1655 11/02/21 0614 11/03/21 0621 11/04/21 0705  WBC 3.4* 2.9* 5.1 4.0    Liver Function Tests: Recent Labs   Lab 11/05/2021 1655 11/04/21 0705  AST 29  --   ALT 35  --   ALKPHOS 57  --   BILITOT 0.5  --   PROT 6.4*  --   ALBUMIN 3.1* 3.2*   No results for input(s): LIPASE, AMYLASE in the last 168 hours. No results for input(s): AMMONIA in the last 168 hours.  ABG No results found for: PHART, PCO2ART, PO2ART, HCO3, TCO2, ACIDBASEDEF, O2SAT   Coagulation Profile: No results for input(s): INR, PROTIME in the last 168 hours.  Cardiac Enzymes: No results for input(s): CKTOTAL, CKMB, CKMBINDEX, TROPONINI in the last 168 hours.  HbA1C: Hemoglobin A1C  Date/Time Value Ref Range Status  01/06/2014 12:39 AM 5.8 4.2 - 6.3 % Final    Comment:    The American Diabetes Association recommends that a primary goal of therapy should be <7% and that physicians should reevaluate the treatment regimen in patients with HbA1c values consistently >8%.    Hgb A1c MFr Bld  Date/Time Value  Ref Range Status  11/02/2021 06:14 AM 5.5 4.8 - 5.6 % Final    Comment:    (NOTE) Pre diabetes:          5.7%-6.4%  Diabetes:              >6.4%  Glycemic control for   <7.0% adults with diabetes   08/22/2020 03:06 AM 5.9 (H) 4.8 - 5.6 % Final    Comment:    (NOTE) Pre diabetes:          5.7%-6.4%  Diabetes:              >6.4%  Glycemic control for   <7.0% adults with diabetes     CBG: Recent Labs  Lab 11/03/21 1752 11/03/21 2126 11/04/21 0812 11/04/21 1225 11/04/21 1626  GLUCAP 211* 188* 184* 242* 103*     Past Medical History:  She,  has a past medical history of Anemia, Aortic atherosclerosis ([place]), Blood transfusion without reported diagnosis, CAD (coronary artery disease), CHF (congestive heart failure) ([place]), CKD (chronic kidney disease), stage III ([place]), Diabetes mellitus type II, GERD (gastroesophageal reflux disease), GI (gastrointestinal bleed), GI AVM (gastrointestinal arteriovenous vascular malformation), History of echocardiogram (12/2013), History of nuclear stress test, HLD  (hyperlipidemia), HTN (hypertension), Hypercholesterolemia, IDA (iron deficiency anemia), Mild cognitive impairment, PAD (peripheral artery disease) ([place]), Polyp of colon, adenomatous, RLS (restless legs syndrome), and TIA (transient ischemic attack) (2021).   Surgical History:   Past Surgical History:  Procedure Laterality Date   ABDOMINAL ADHESION SURGERY  11/2001   Dr. Tamala Julian   ABDOMINAL HYSTERECTOMY  1976   RSO (fibroid)   COLONOSCOPY WITH PROPOFOL N/A 07/07/2016   Procedure: COLONOSCOPY WITH PROPOFOL;  Surgeon: Manya Silvas, MD;  Location: [facility] ENDOSCOPY;  Service: Endoscopy;  Laterality: N/A;   COLONOSCOPY WITH PROPOFOL N/A 12/14/2016   Procedure: COLONOSCOPY WITH PROPOFOL;  Surgeon: Manya Silvas, MD;  [facility] END[facility];  Service: Endoscopy;  Laterality: N/A;   DOBUTAMINE STRESS ECHO  11/10   normal   DOPPLER ECHOCARDIOGRAPHY     EF 55%, mild MR, TR   ENTEROSCOPY N/A 06/08/2018   Procedure: ENTEROSCOPY;  Surgeon: Lin Landsman, MD;  Location: [facility];  Service: Gastroenterology;  Laterality: N/A;   ENTEROSCOPY N/A 01/06/2020   Procedure: ENTEROSCOPY;  Surgeon: Lin Landsman, MD;  Location: [facility] ENDOSCOPY;  Service: Gastroenterology;  Laterality: N/A;   ENTEROSCOPY N/A 08/22/2020   Procedure: ENTEROSCOPY;  Surgeon: [name], MD;  Location[facility]COPY;  Service: Gastroenterology;  Laterality: N/A;   ESOPHAGOGASTRODUODENOSCOPY  07/2006   multiple angioectasias   ESOPHAGOGASTRODUODENOSCOPY Left 03/12/2015   Procedure: place tube in throat and evaluate stomach and duodenum for source of bleeding. Cauterize if concern for rebleeding.;  Surgeon: Hulen Luster, MD;  Location: [facility] ENDOSCOPY;  Service: Endoscopy;  Laterality: Left;   ESOPHAGOGASTRODUODENOSCOPY N/A 12/14/2016   Procedure: ESOPHAGOGASTRODUODENOSCOPY (EGD);  Surgeon: Manya Silvas, MD;  Location: [facility] ENDOSCOPY;  Service: Endoscopy;  Laterality: N/A;   ESOPHAGOGASTRODUODENOSCOPY N/A 07/19/2017    Procedure: ESOPHAGOGASTRODUODENOSCOPY (EGD);  Surgeon: Lin Landsman, MD;  Location[facility] ENDOSCOPY;  Service: Gastroenterology;  Laterality: N/A;   ESOPHAGOGASTRODUODENOSCOPY (EGD) WITH PROPOFOL N/A 11/23/2015   Procedure: ESOPHAGOGASTRODUODENOSCOPY (EGD) WITH PROPOFOL;  Surgeon: Hulen Luster, MD;  Location: [facility] ENDOSCOPY;  Service: Gastroenterology;  Laterality: N/A;   ESOPHAGOGASTRODUODENOSCOPY (EGD) WITH PROPOFOL N/A 07/07/2016   Procedure: ESOPHAGOGASTRODUODENOSCOPY (EGD) WITH PROPOFOL;  Surgeon: Manya Silvas, MD;  Location: [facility] ENDOSCOPY;  Service: Endoscopy;  Laterality: N/A;   ESOPHAGOGASTRODUODENOSCOPY (  EGD) WITH PROPOFOL N/A 05/19/2017   Procedure: ESOPHAGOGASTRODUODENOSCOPY (EGD) WITH PROPOFOL;  Surgeon: Manya Silvas, MD;  Location: [facility] ENDOSCOPY;  Service: Endoscopy;  Laterality: N/A;   ESOPHAGOGASTRODUODENOSCOPY (EGD) WITH PROPOFOL N/A 06/26/2020   Procedure: ESOPHAGOGASTRODUODENOSCOPY (EGD) WITH PROPOFOL;  Surgeon: Lucilla Lame, MD;  Location: [facility] ENDOSCOPY;  Service: Endoscopy;  Laterality: N/A;   laryngeal polyp  10/2008   Dr. Tami Ribas   TOP     VIDEO BRONCHOSCOPY WITH ENDOBRONCHIAL NAVIGATION Left 12/02/2020   Procedure: VIDEO BRONCHOSCOPY WITH ENDOBRONCHIAL NAVIGATION;  Surgeon: Tyler Pita, MD;  Location: [facility] ORS;  Service: Pulmonary;  Laterality: Left;     Social History:   reports that she quit smoking about 4 months ago. Her smoking use included cigarettes. She has a 63.00 pack-year smoking history. She has never used smokeless tobacco. She reports that she does not drink alcohol and does not use drugs.   Family History:  Her family history includes Cancer in her sister; Diabetes in an other family member; Hypertension in her mother and another family member; Stroke in her father.   Allergies Allergies  Allergen Reactions   Nifedipine Cough   Metformin Diarrhea    REACTION: diarrhea when dosage is increased, ok when taking one tablet daily, patient  aware that she is on metformin when it is listed that she is allergic     Home Medications  Prior to Admission medications   Medication Sig Start Date End Date Taking? Authorizing Provider  albuterol (VENTOLIN HFA) 108 (90 Base) MCG/ACT inhaler Inhale 2 puffs into the lungs every 6 (six) hours as needed for wheezing or shortness of breath. 10/08/21  Yes Venia Carbon, MD  clonazePAM (KLONOPIN) 0.5 MG tablet Take 1-2 tablets (0.5-1 mg total) by mouth at bedtime. 10/08/21  Yes Viviana Simpler I, MD  ferrous sulfate 325 (65 FE) MG tablet Take 325 mg by mouth daily with breakfast.   Yes [provider]  lisinopril-hydrochlorothiazide (ZESTORETIC) 20-12.5 MG tablet TAKE 2 TABLETS BY MOUTH DAILY Patient taking differently: Take 1 tablet by mouth daily. Take 1 tablet by  mouth daily 12/23/20  Yes Viviana Simpler I, MD  metFORMIN (GLUCOPHAGE-XR) 500 MG 24 hr tablet TAKE 1 TABLET(500 MG) BY MOUTH DAILY WITH BREAKFAST 08/16/21  Yes Venia Carbon, MD  omeprazole (PRILOSEC) 40 MG capsule Take 1 capsule (40 mg total) by mouth in the morning and at bedtime. 06/09/21  Yes Venia Carbon, MD  simvastatin (ZOCOR) 80 MG tablet TAKE 1 TABLET(80 MG) BY MOUTH DAILY 01/11/21  Yes Venia Carbon, MD  vitamin B-12 (CYANOCOBALAMIN) 1000 MCG tablet Take 1,000 mcg by mouth daily.   Yes [provider]  glucose blood (ONETOUCH VERIO) test strip Use to check blood sugar once daily E11.69 11/26/20   Venia Carbon, MD  OneTouch Delica Lancets 56L MISC 1 each by Does not apply route daily. Use to obtain blood sugar sample daily E11.69 11/26/20   Venia Carbon, MD       DVT/GI PRX  assessed I Assessed the need for Labs I Assessed the need for Foley I Assessed the need for Central Venous Line Family Discussion when available I Assessed the need for Mobilization I made an Assessment of medications to be adjusted accordingly Safety Risk assessment completed  CASE DISCUSSED IN  MULTIDISCIPLINARY ROUNDS WITH ICU TEAM     Critical Care Time devoted to patient care services described in this note is 65 minutes.   Critical care was necessary to treat /prevent  imminent and life-threatening deterioration.   PATIENT WITH VERY POOR PROGNOSIS I ANTICIPATE PROLONGED ICU LOS  RECOMMEND DNR STATUS AND COMFORT CARE MEASURES  Patient is critically ill. Patient with Multiorgan failure and at high risk for cardiac arrest and death.    Corrin Parker, M.D.  [facility]  Medical Director [place] Director [facility] Cardio-Pulmonary Department

## 2021-11-04 NOTE — ED Provider Notes (Signed)
[facility] Department of Emergency Medicine   Code Blue CONSULT NOTE  Chief Complaint: Cardiac arrest/unresponsive   Level V Caveat: Unresponsive  History of present illness: I was contacted by the hospital for a CODE BLUE cardiac arrest upstairs and presented to the patient's bedside.  CPR was in progress upon my arrival.  Pulse check upon my arrival showed pulseless activity.  Compressions continued will be hooked up a cardiac monitor.  Confirmed that the patient is a full code.  Nurse spoke to the sister during the resuscitation who still wishes for full resuscitation.  ROS: Unable to obtain, Level V caveat  Scheduled Meds:  feeding supplement  237 mL Oral TID BM   ferrous sulfate  325 mg Oral BID WC   guaiFENesin  600 mg Oral BID   hydrochlorothiazide  12.5 mg Oral Daily   insulin aspart  0-5 Units Subcutaneous QHS   insulin aspart  0-9 Units Subcutaneous TID WC   [START ON 11/05/2021] lisinopril  20 mg Oral Daily   methimazole  5 mg Oral Daily   multivitamin with minerals  1 tablet Oral Daily   nitroGLYCERIN  1 inch Topical Q6H   propranolol  40 mg Oral BID   simvastatin  80 mg Oral q1800   sodium chloride flush  3 mL Intravenous Q12H   Continuous Infusions:  sodium chloride     PRN Meds:.sodium chloride, acetaminophen, benzonatate, diphenhydrAMINE, ondansetron (ZOFRAN) IV, sodium chloride flush Past Medical History:  Diagnosis Date   Anemia    Aortic atherosclerosis ([place])    Blood transfusion without reported diagnosis    CAD (coronary artery disease)    CHF (congestive heart failure) ([place])    CKD (chronic kidney disease), stage III ([place])    Diabetes mellitus type II    GERD (gastroesophageal reflux disease)    GI (gastrointestinal bleed)    GI AVM (gastrointestinal arteriovenous vascular malformation)    stomach   History of echocardiogram 12/2013   a. 12/2013: EF 60-65%, DD, mild LVH, nl RV size & systolic function, mild MR/TR, mild AS, nl  RVSP; b. TTE 04/2017: EF 60-65%, normal wall motion, GR1DD, mild MR, left atrium normal in size, normal RV systolic function, PASP normal    History of nuclear stress test    a. 12/2013: low risk, no sig WMA, nondiag EKG 2/2 baseline LVH w/ repol abnl, no sig ischemia, EF 70% no artifact   HLD (hyperlipidemia)    HTN (hypertension)    Hypercholesterolemia    IDA (iron deficiency anemia)    Mild cognitive impairment    PAD (peripheral artery disease) ([facility])    Polyp of colon, adenomatous    RLS (restless legs syndrome)    TIA (transient ischemic attack) 2021   Past Surgical History:  Procedure Laterality Date   ABDOMINAL ADHESION SURGERY  11/2001   Dr. Tamala Julian   ABDOMINAL HYSTERECTOMY  1976   RSO (fibroid)   COLONOSCOPY WITH PROPOFOL N/A 07/07/2016   Procedure: COLONOSCOPY WITH PROPOFOL;  Surgeon: Manya Silvas, MD;  Location: [place];  Service: Endoscopy;  Laterality: N/A;   COLONOSCOPY WITH PROPOFOL N/A 12/14/2016   Procedure: COLONOSCOPY WITH PROPOFOL;  Surgeon: Manya Silvas, MD;  Location: [facility] ENDOSCOPY;  Service: Endoscopy;  Laterality: N/A;   DOBUTAMINE STRESS ECHO  11/10   normal   DOPPLER ECHOCARDIOGRAPHY     EF 55%, mild MR, TR   ENTEROSCOPY N/A 06/08/2018   Procedure: ENTEROSCOPY;  Surgeon: Lin Landsman, MD;  Location: [facility]  ENDOSCOPY;  Service: Gastroenterology;  Laterality: N/A;   ENTEROSCOPY N/A 01/06/2020   Procedure: ENTEROSCOPY;  Surgeon: Lin Landsman, MD;  Location: [facility] ENDOSCOPY;  Service: Gastroenterology;  Laterality: N/A;   ENTEROSCOPY N/A 08/22/2020   Procedure: ENTEROSCOPY;  Surgeon: [name], MD;  Location: [facility] ENDOSCOPY;  Service: Gastroenterology;  Laterality: N/A;   ESOPHAGOGASTRODUODENOSCOPY  07/2006   multiple angioectasias   ESOPHAGOGASTRODUODENOSCOPY Left 03/12/2015   Procedure: place tube in throat and evaluate stomach and duodenum for source of bleeding. Cauterize if concern for rebleeding.;  Surgeon: Hulen Luster, MD;  Location:  [facility] ENDOSCOPY;  Service: Endoscopy;  Laterality: Left;   ESOPHAGOGASTRODUODENOSCOPY N/A 12/14/2016   Procedure: ESOPHAGOGASTRODUODENOSCOPY (EGD);  Surgeon: Manya Silvas, MD;  Location: [facility] ENDOSCOPY;  Service: Endoscopy;  Laterality: N/A;   ESOPHAGOGASTRODUODENOSCOPY N/A 07/19/2017   Procedure: ESOPHAGOGASTRODUODENOSCOPY (EGD);  Surgeon: Lin Landsman, MD;  Location: [facility] ENDOSCOPY;  Service: Gastroenterology;  Laterality: N/A;   ESOPHAGOGASTRODUODENOSCOPY (EGD) WITH PROPOFOL N/A 11/23/2015   Procedure: ESOPHAGOGASTRODUODENOSCOPY (EGD) WITH PROPOFOL;  Surgeon: Hulen Luster, MD;  Location: [facility] ENDOSCOPY;  Service: Gastroenterology;  Laterality: N/A;   ESOPHAGOGASTRODUODENOSCOPY (EGD) WITH PROPOFOL N/A 07/07/2016   Procedure: ESOPHAGOGASTRODUODENOSCOPY (EGD) WITH PROPOFOL;  Surgeon: Manya Silvas, MD;  Location: [facility] ENDOSCOPY;  Service: Endoscopy;  Laterality: N/A;   ESOPHAGOGASTRODUODENOSCOPY (EGD) WITH PROPOFOL N/A 05/19/2017   Procedure: ESOPHAGOGASTRODUODENOSCOPY (EGD) WITH PROPOFOL;  Surgeon: Manya Silvas, MD;  Location: [facility] ENDOSCOPY;  Service: Endoscopy;  Laterality: N/A;   ESOPHAGOGASTRODUODENOSCOPY (EGD) WITH PROPOFOL N/A 06/26/2020   Procedure: ESOPHAGOGASTRODUODENOSCOPY (EGD) WITH PROPOFOL;  Surgeon: Lucilla Lame, MD;  Location: [facility] ENDOSCOPY;  Service: Endoscopy;  Laterality: N/A;   laryngeal polyp  10/2008   Dr. Tami Ribas   TOP     VIDEO BRONCHOSCOPY WITH ENDOBRONCHIAL NAVIGATION Left 12/02/2020   Procedure: VIDEO BRONCHOSCOPY WITH ENDOBRONCHIAL NAVIGATION;  Surgeon: Tyler Pita, MD;  Location: [facility] ORS;  Service: Pulmonary;  Laterality: Left;   Social History   Socioeconomic History   Marital status: Widowed    Spouse name: Not on file   Number of children: 1   Years of education: Not on file   Highest education level: Not on file  Occupational History   Occupation: Regulatory affairs officer    Comment: retired  Tobacco Use   Smoking status: Former    Packs/day: 1.00     Years: 63.00    Pack years: 63.00    Types: Cigarettes    Quit date: 06/24/2021    Years since quitting: 0.3   Smokeless tobacco: Never   Tobacco comments:    Pt reports she quit smoking end of summer 2022  Vaping Use   Vaping Use: Never used  Substance and Sexual Activity   Alcohol use: No   Drug use: No   Sexual activity: Not on file  Other Topics Concern   Not on file  Social History Narrative   Lives with sister; in [place] since 36 years; 1/2 ppd; no alcohol. Used to work in [place].       No living will   Requests son Orson Gear as health care POA   Would accept resuscitation --but no prolonged machines   Not sure about feeding tubes   Social Determinants of Health   Financial Resource Strain: Not on file  Food Insecurity: Not on file  Transportation Needs: Not on file  Physical Activity: Not on file  Stress: Not on file  Social Connections: Not on file  Intimate Partner Violence: Not on file  Allergies  Allergen Reactions   Nifedipine Cough   Metformin Diarrhea    REACTION: diarrhea when dosage is increased, ok when taking one tablet daily, patient aware that she is on metformin when it is listed that she is allergic    Last set of Vital Signs (not current) Vitals:   11/04/21 1626 11/04/21 1727  BP: (!) 177/63 (!) 253/84  Pulse: (!) 113 (!) 127  Resp: 20   Temp: 97.7 F (36.5 C)   SpO2: 91% 92%    Physical Exam  Gen: unresponsive Cardiovascular: pulseless.  Good pulse with compressions. Resp: apneic. Breath sounds equal bilaterally with bagging  Abd: nondistended  Neuro: GCS 3, unresponsive to pain  HEENT: No blood in posterior pharynx, gag reflex absent  Neck: No crepitus  Musculoskeletal: No deformity  Skin: warm  Procedures (when applicable, including Critical Care time): Procedures  CRITICAL CARE Performed by: Harvest Dark   Total critical care time: 20 minutes  Critical care time was exclusive of separately billable  procedures and treating other patients.  Critical care was necessary to treat or prevent imminent or life-threatening deterioration.  Critical care was time spent personally by me on the following activities: development of treatment plan with patient and/or surrogate as well as nursing, discussions with consultants, evaluation of patient's response to treatment, examination of patient, obtaining history from patient or surrogate, ordering and performing treatments and interventions, ordering and review of laboratory studies, ordering and review of radiographic studies, pulse oximetry and re-evaluation of patient's condition.  Cardiopulmonary Resuscitation (CPR) Procedure Note Directed/Performed by: Harvest Dark I personally directed ancillary staff and/or performed CPR in an effort to regain return of spontaneous circulation and to maintain cardiac, neuro and systemic perfusion.    MDM / Assessment and Plan I presented to the patient's room CPR in progress.  I was the first physician to arrive.  Pause compressions to confirm no pulse.  CPR was continued while the patient was being hooked up to a cardiac monitor.  2 rounds of epinephrine given with continuous compressions.  Respiratory therapy intubated the patient just as I was arriving, good color change.  After approximately 10 to 12 minutes of CPR with 2 rounds of epinephrine we had return of spontaneous circulation with a strong pulse and good blood pressure.  At that time care was transferred over to the hospitalist Dr. Posey Pronto as well as intensivist Dr. Mortimer Fries.  Patient will be moved to the ICU.   Harvest Dark, MD 11/04/21 1734

## 2021-11-04 NOTE — Progress Notes (Signed)
°  Chaplain On-Call responded to Code Blue notification for the patient who was in room 251.  The medical team was attending to the patient, in preparation to move her to the ICU, bed 18.  No family members are present.  Chaplain remains available for additional support as needed.  Chaplain Pollyann Samples M.Div., [facility]

## 2021-11-05 DIAGNOSIS — I5033 Acute on chronic diastolic (congestive) heart failure: Secondary | ICD-10-CM | POA: Diagnosis not present

## 2021-11-05 LAB — CBC WITH DIFFERENTIAL/PLATELET
Abs Immature Granulocytes: 0.02 10*3/uL (ref 0.00–0.07)
Basophils Absolute: 0 10*3/uL (ref 0.0–0.1)
Basophils Relative: 0 %
Eosinophils Absolute: 0 10*3/uL (ref 0.0–0.5)
Eosinophils Relative: 0 %
HCT: 36.6 % (ref 36.0–46.0)
Hemoglobin: 10.9 g/dL — ABNORMAL LOW (ref 12.0–15.0)
Immature Granulocytes: 0 %
Lymphocytes Relative: 5 %
Lymphs Abs: 0.4 10*3/uL — ABNORMAL LOW (ref 0.7–4.0)
MCH: 24.9 pg — ABNORMAL LOW (ref 26.0–34.0)
MCHC: 29.8 g/dL — ABNORMAL LOW (ref 30.0–36.0)
MCV: 83.6 fL (ref 80.0–100.0)
Monocytes Absolute: 0.9 10*3/uL (ref 0.1–1.0)
Monocytes Relative: 10 %
Neutro Abs: 7.3 10*3/uL (ref 1.7–7.7)
Neutrophils Relative %: 85 %
Platelets: 236 10*3/uL (ref 150–400)
RBC: 4.38 MIL/uL (ref 3.87–5.11)
RDW: 14.6 % (ref 11.5–15.5)
WBC: 8.6 10*3/uL (ref 4.0–10.5)
nRBC: 0 % (ref 0.0–0.2)

## 2021-11-05 LAB — BASIC METABOLIC PANEL
Anion gap: 9 (ref 5–15)
BUN: 69 mg/dL — ABNORMAL HIGH (ref 8–23)
CO2: 31 mmol/L (ref 22–32)
Calcium: 9.8 mg/dL (ref 8.9–10.3)
Chloride: 105 mmol/L (ref 98–111)
Creatinine, Ser: 1.28 mg/dL — ABNORMAL HIGH (ref 0.44–1.00)
GFR, Estimated: 43 mL/min — ABNORMAL LOW (ref >60)
Glucose, Bld: 196 mg/dL — ABNORMAL HIGH (ref 70–99)
Potassium: 4.2 mmol/L (ref 3.5–5.1)
Sodium: 145 mmol/L (ref 135–145)

## 2021-11-05 LAB — ALBUMIN: Albumin: 2.8 g/dL — ABNORMAL LOW (ref 3.5–5.0)

## 2021-11-05 LAB — GLUCOSE, CAPILLARY
Glucose-Capillary: 122 mg/dL — ABNORMAL HIGH (ref 70–99)
Glucose-Capillary: 128 mg/dL — ABNORMAL HIGH (ref 70–99)
Glucose-Capillary: 138 mg/dL — ABNORMAL HIGH (ref 70–99)
Glucose-Capillary: 146 mg/dL — ABNORMAL HIGH (ref 70–99)
Glucose-Capillary: 148 mg/dL — ABNORMAL HIGH (ref 70–99)
Glucose-Capillary: 167 mg/dL — ABNORMAL HIGH (ref 70–99)
Glucose-Capillary: 181 mg/dL — ABNORMAL HIGH (ref 70–99)
Glucose-Capillary: 213 mg/dL — ABNORMAL HIGH (ref 70–99)

## 2021-11-05 LAB — BLOOD GAS, ARTERIAL
Acid-Base Excess: 6.1 mmol/L — ABNORMAL HIGH (ref 0.0–2.0)
Bicarbonate: 33.7 mmol/L — ABNORMAL HIGH (ref 20.0–28.0)
FIO2: 60
MECHVT: 300 mL
Mechanical Rate: 22
O2 Saturation: 98.5 %
PEEP: 5 cmH2O
Patient temperature: 37
pCO2 arterial: 64 mmHg — ABNORMAL HIGH (ref 32.0–48.0)
pH, Arterial: 7.33 — ABNORMAL LOW (ref 7.350–7.450)
pO2, Arterial: 121 mmHg — ABNORMAL HIGH (ref 83.0–108.0)

## 2021-11-05 LAB — PROCALCITONIN: Procalcitonin: 4.71 ng/mL

## 2021-11-05 LAB — MAGNESIUM: Magnesium: 2.2 mg/dL (ref 1.7–2.4)

## 2021-11-05 LAB — PHOSPHORUS
Phosphorus: 5.6 mg/dL — ABNORMAL HIGH (ref 2.5–4.6)
Phosphorus: 7.4 mg/dL — ABNORMAL HIGH (ref 2.5–4.6)

## 2021-11-05 LAB — TRIGLYCERIDES: Triglycerides: 80 mg/dL (ref <150)

## 2021-11-05 MED ORDER — HYDROCHLOROTHIAZIDE 12.5 MG PO TABS
12.5000 mg | ORAL_TABLET | Freq: Every day | ORAL | Status: DC
  Filled 2021-11-05: qty 1

## 2021-11-05 MED ORDER — NOREPINEPHRINE 4 MG/250ML-% IV SOLN
0.0000 ug/min | INTRAVENOUS | Status: DC
  Administered 2021-11-06: 10 ug/min via INTRAVENOUS
  Filled 2021-11-05: qty 250

## 2021-11-05 MED ORDER — NOREPINEPHRINE 4 MG/250ML-% IV SOLN
INTRAVENOUS | Status: AC
  Administered 2021-11-05: 4 mg
  Filled 2021-11-05: qty 250

## 2021-11-05 MED ORDER — ADULT MULTIVITAMIN W/MINERALS CH
1.0000 | ORAL_TABLET | Freq: Every day | ORAL | Status: DC
  Administered 2021-11-06: 1
  Filled 2021-11-05: qty 1

## 2021-11-05 MED ORDER — LABETALOL HCL 5 MG/ML IV SOLN
10.0000 mg | INTRAVENOUS | Status: DC | PRN
  Administered 2021-11-05 (×2): 10 mg via INTRAVENOUS
  Filled 2021-11-05 (×2): qty 4

## 2021-11-05 MED ORDER — MIDAZOLAM HCL 2 MG/2ML IJ SOLN: 1.0000 mg | INTRAMUSCULAR | Status: DC | PRN

## 2021-11-05 MED ORDER — LISINOPRIL 20 MG PO TABS: 20.0000 mg | ORAL_TABLET | Freq: Every day | ORAL | Status: DC

## 2021-11-05 MED ORDER — SIMVASTATIN 20 MG PO TABS
80.0000 mg | ORAL_TABLET | Freq: Every day | ORAL | Status: DC
  Administered 2021-11-05: 80 mg
  Filled 2021-11-05: qty 4

## 2021-11-05 MED ORDER — METHIMAZOLE 5 MG PO TABS
5.0000 mg | ORAL_TABLET | Freq: Every day | ORAL | Status: DC
  Administered 2021-11-06: 5 mg
  Filled 2021-11-05: qty 1

## 2021-11-05 MED ORDER — VITAL HIGH PROTEIN PO LIQD
1000.0000 mL | ORAL | Status: DC
  Administered 2021-11-05: 1000 mL

## 2021-11-05 MED ORDER — ACETAMINOPHEN 160 MG/5ML PO SOLN
650.0000 mg | ORAL | Status: DC | PRN
  Administered 2021-11-05 – 2021-11-06 (×2): 650 mg
  Filled 2021-11-05 (×4): qty 20.3

## 2021-11-05 MED ORDER — FREE WATER
30.0000 mL | Status: DC
  Administered 2021-11-05 – 2021-11-06 (×5): 30 mL

## 2021-11-05 MED ORDER — PROPRANOLOL HCL 20 MG PO TABS: 40.0000 mg | ORAL_TABLET | Freq: Two times a day (BID) | ORAL | Status: DC

## 2021-11-05 NOTE — Progress Notes (Signed)
Nutrition Follow-up  DOCUMENTATION CODES:   Underweight, Severe malnutrition in context of chronic illness  INTERVENTION:   -D/c Ensure Enlive Initiate Vital High Protein @ 20 ml/hr via OGT and increase by 10 ml every 12 hours to goal rate of 40 ml/hr.   30 ml free water flush every 4 hours for tube patency  Tube feeding regimen provides 960 kcal (100% of needs), 84 grams of protein, and 803 ml of H2O. Total free water: 938 ml daily  -Monitor Mg, K, and Phos daily and replete as needed secondary to high refeeding risk   NUTRITION DIAGNOSIS:   Severe Malnutrition related to chronic illness (CHF) as evidenced by moderate fat depletion, severe fat depletion, moderate muscle depletion, severe muscle depletion.  Ongoing  GOAL:   Patient will meet greater than or equal to 90% of their needs  Progressing   MONITOR:   PO intake, Supplement acceptance, Labs, Weight trends, Skin, I & O's  REASON FOR ASSESSMENT:   Ventilator Assessment of nutrition requirement/status  ASSESSMENT:   Kathryn Ware is a 81 y.o. female with medical history significant for DM, HTN, gastric AVMs with history of chronic blood loss anemia, on weekly iron transfusions by hematology, mild cognitive deficit, stage I NSCLC completed XRT 01/12/2021, who presents to the ED for evaluation of lower extremity weakness, feeling like her legs giving out, making it difficult to ambulate, causing her to fall a couple times but without injury.  She denies shortness of breath, denies orthopnea, chest pain, and denies cough fever or chills.  1/12- code blue, intubated, and transferred to ICU  Patient is currently intubated on ventilator support. OGT currently connected to low, intermittent suction. OGT placement verified by x-ray.  MV: 5.4 L/min Temp (24hrs), Avg:97.4 F (36.3 C), Min:97 F (36.1 C), Max:98 F (36.7 C)  Reviewed I/O's: +181 ml x 24 hours  MAP: 88  Case discussed with RN, MD, and during ICU rounds.  Pt family desires for pt to be full code. Palliative care consult is pending. Per MD, also plan for neuro assessment.   Per MD, ok to start TF today.    Medications reviewed and include ferrous sulfate and miralax.   Labs reviewed: CBGS: 122-235.   Diet Order:   Diet Order             Diet NPO time specified Except for: Other (See Comments)  Diet effective now                   EDUCATION NEEDS:   No education needs have been identified at this time  Skin:  Skin Assessment: Reviewed RN Assessment  Last BM:  11/02/21  Height:   Ht Readings from Last 1 Encounters:  11/04/21 5\' 5"  (1.651 m)    Weight:   Wt Readings from Last 1 Encounters:  11/05/21 35 kg    Ideal Body Weight:  56.8 kg  BMI:  Body mass index is 12.84 kg/m.  Estimated Nutritional Needs:   Kcal:  1550-1750  Protein:  80-95 grams  Fluid:  > 1.5 L    Loistine Chance, RD, LDN, [place] Registered Dietitian II Certified Diabetes Care and Education Specialist Please refer to [facility] for RD and/or RD on-call/weekend/after hours pager

## 2021-11-05 NOTE — Progress Notes (Signed)
Sudden onset bradycardia and hypotension, acls protocol initiated, atropine 1 amp iv push and sodium bicarb per Dr Mortimer Fries.  Levophep initiated with desired effedrt

## 2021-11-05 NOTE — Progress Notes (Signed)
Goals of Care  Carrington Clamp, the patient's son was called bedside due to the patient's instability and concern for impending cardiac arrest. The Clinical status was relayed to family in detail. The patient remains unresponsive and will not open eyes to command.   She has progressive multiorgan failure with very low chance of meaningful recovery despite all aggressive and optimal medical therapy.  Patient is in the Dying Process associated with Suffering.  Family understands the situation and has consented and agreed to DNR.  Will however continue plan of care at this time.  Family are satisfied with Plan of action and management. All questions answered  Additional CC time 32 mins   Domingo Pulse [place], AGACNP-BC [facility]    Please see Amion for pager details.

## 2021-11-05 NOTE — Progress Notes (Signed)
NAME:  Kathryn Ware, MRN:  [id number], DOB:  [birth year], LOS: 4 ADMISSION DATE:  10/27/2021   BRIEF SYNOPSIS 81 y.o. female with medical history significant for DM, HTN, gastric AVMs with history of chronic blood loss anemia, on weekly iron transfusions by hematology, mild cognitive deficit, stage I NSCLC completed XRT 01/12/2021,   ACUTE MI [facility] Events: Including procedures, antibiotic start and stop dates in addition to other pertinent events   1/9 admitted for acute [facility] 1/12 transferred to ICU for acute cardiac arrest 6 mins CPR       Interim History / Subjective:  Remains intubated Severe malnutrition Cardiac failure      Objective   Blood pressure (!) 135/49, pulse 76, temperature (!) 97.3 F (36.3 C), resp. rate (!) 22, height '5\' 5"'  (1.651 m), weight 35 kg, SpO2 100 %.    Vent Mode: PRVC FiO2 (%):  [45 %-60 %] 45 % Set Rate:  [15 bmp-22 bmp] 22 bmp Vt Set:  [300 mL] 300 mL PEEP:  [5 cmH20] 5 cmH20 Plateau Pressure:  [12 cmH20-15 cmH20] 12 cmH20   Intake/Output Summary (Last 24 hours) at 11/05/2021 0731 Last data filed at 11/05/2021 0500 Gross per 24 hour  Intake 180.85 ml  Output --  Net 180.85 ml   Filed Weights   11/04/21 0500 11/04/21 1740 11/05/21 0500  Weight: 36.3 kg 34.8 kg 35 kg      REVIEW OF SYSTEMS  PATIENT IS UNABLE TO PROVIDE COMPLETE REVIEW OF SYSTEMS DUE TO SEVERE CRITICAL ILLNESS AND TOXIC METABOLIC ENCEPHALOPATHY  ALL OTHER ROS ARE NEGATIVE   PHYSICAL EXAMINATION:  GENERAL:critically ill appearing, +resp distress EYES: Pupils equal, round, reactive to light.  No scleral icterus.  MOUTH: Moist mucosal membrane. INTUBATED NECK: Supple.  PULMONARY: +rhonchi, +wheezing CARDIOVASCULAR: S1 and S2.  No murmurs  GASTROINTESTINAL: Soft, nontender, -distended. Positive bowel sounds.  MUSCULOSKELETAL: No swelling, clubbing, or edema.  NEUROLOGIC:  obtunded SKIN:intact,warm,dry    Labs/imaging that I havepersonally reviewed  (right click and "Reselect all SmartList Selections" daily)       ASSESSMENT AND PLAN SYNOPSIS  81 yo with acute dCHF with l;ung cancer with COPD with acute sudden cardiac arrest with NSTEMI   Severe ACUTE Hypoxic and Hypercapnic Respiratory Failure -continue Mechanical Ventilator support -continue Bronchodilator Therapy -Wean Fio2 and PEEP as tolerated -VAP/VENT bundle implementation -will not perform SAT/SBT when respiratory parameters are met  Vent Mode: PRVC FiO2 (%):  [45 %-60 %] 45 % Set Rate:  [15 bmp-22 bmp] 22 bmp Vt Set:  [300 mL] 300 mL PEEP:  [5 cmH20] 5 cmH20 Plateau Pressure:  [12 cmH20-15 cmH20] 12 cmH20   SEVERE COPD EXACERBATION -continue IV steroids as prescribed -continue NEB THERAPY as prescribed    CARDIAC FAILURE-acute DEMAND ISCHEMIA AND NSTEMI Acute diastolic heart failure -oxygen as needed -Lasix as tolerated -follow up cardiac enzymes as indicated   CARDIAC ICU monitoring   ACUTE KIDNEY INJURY/Renal Failure -continue Foley Catheter-assess need -Avoid nephrotoxic agents -Follow urine output, BMP -Ensure adequate renal perfusion, optimize oxygenation -Renal dose medications   Intake/Output Summary (Last 24 hours) at 11/05/2021 0731 Last data filed at 11/05/2021 0500 Gross per 24 hour  Intake 180.85 ml  Output --  Net 180.85 ml     NEUROLOGY Acute toxic metabolic encephalopathy   SHOCK SOURCE-CARDIOGENIC -use vasopressors to keep MAP>65 as needed -follow ABG and LA   ENDO - ICU hypoglycemic\Hyperglycemia protocol -check FSBS per protocol   GI  GI PROPHYLAXIS as indicated  NUTRITIONAL STATUS DIET-->NPO Constipation protocol as indicated   ELECTROLYTES -follow labs as needed -replace as needed -pharmacy consultation and following     Best practice (right click and "Reselect all SmartList Selections" daily)  Diet:   NPO Pain/Anxiety/Delirium protocol (if indicated): No VAP protocol (if indicated): Yes DVT prophylaxis: Subcutaneous Heparin GI prophylaxis: PPI Foley:  Yes, and it is still needed Mobility:  bed rest  Code Status:  FULL CODE Disposition: ICU  Labs   CBC: Recent Labs  Lab 10/24/2021 1655 11/02/21 0614 11/03/21 0621 11/04/21 0705 11/04/21 1950 11/05/21 0447  WBC 3.4* 2.9* 5.1 4.0 9.3 8.6  NEUTROABS 2.1 2.3 3.8  --   --  7.3  HGB 9.3* 10.3* 11.3* 9.4* 11.3* 10.9*  HCT 29.8* 33.1* 36.1 30.2* 37.4 36.6  MCV 81.4 80.7 79.3* 80.3 84.6 83.6  PLT 226 267 337 274 277 830    Basic Metabolic Panel: Recent Labs  Lab 11/18/2021 1655 11/02/21 0614 11/03/21 0621 11/04/21 0705 11/04/21 1754 11/05/21 0447  NA 139 142 140 141 145 145  K 3.6 3.5 3.9 3.6 4.7 4.2  CL 101 100 97* 102 101 105  CO2 '26 24 30 31 29 31  ' GLUCOSE 70 73 156* 158* 83 196*  BUN 19 25* 52* 54* 52* 69*  CREATININE 0.63 0.78 1.01* 0.69 0.97 1.28*  CALCIUM 9.7 10.5* 11.0* 10.6* 10.8* 9.8  MG 1.3*  --  2.3 2.1  --  2.2  PHOS  --   --  5.3*  --   --   --    GFR: Estimated Creatinine Clearance: 19.7 mL/min (A) (by C-G formula based on SCr of 1.28 mg/dL (H)). Recent Labs  Lab 11/03/21 0621 11/04/21 0705 11/04/21 1754 11/04/21 1924 11/04/21 1950 11/04/21 2205 11/05/21 0447  PROCALCITON  --   --   --  <0.10  --   --  4.71  WBC 5.1 4.0  --   --  9.3  --  8.6  LATICACIDVEN  --   --  6.2*  --   --  2.1*  --     Liver Function Tests: Recent Labs  Lab 11/02/2021 1655 11/04/21 0705 11/05/21 0447  AST 29  --   --   ALT 35  --   --   ALKPHOS 57  --   --   BILITOT 0.5  --   --   PROT 6.4*  --   --   ALBUMIN 3.1* 3.2* 2.8*   No results for input(s): LIPASE, AMYLASE in the last 168 hours. No results for input(s): AMMONIA in the last 168 hours.  ABG    Component Value Date/Time   PHART 7.33 (L) 11/05/2021 0500   PCO2ART 64 (H) 11/05/2021 0500   PO2ART 121 (H) 11/05/2021 0500   HCO3 33.7 (H) 11/05/2021 0500    ACIDBASEDEF 0.9 11/04/2021 1743   O2SAT 98.5 11/05/2021 0500     Coagulation Profile: No results for input(s): INR, PROTIME in the last 168 hours.  Cardiac Enzymes: No results for input(s): CKTOTAL, CKMB, CKMBINDEX, TROPONINI in the last 168 hours.  HbA1C: Hemoglobin A1C  Date/Time Value Ref Range Status  01/06/2014 12:39 AM 5.8 4.2 - 6.3 % Final    Comment:    The American Diabetes Association recommends that a primary goal of therapy should be <7% and that physicians should reevaluate the treatment regimen in patients with HbA1c values consistently >8%.    Hgb A1c MFr Bld  Date/Time Value Ref Range  Status  11/02/2021 06:14 AM 5.5 4.8 - 5.6 % Final    Comment:    (NOTE) Pre diabetes:          5.7%-6.4%  Diabetes:              >6.4%  Glycemic control for   <7.0% adults with diabetes   08/22/2020 03:06 AM 5.9 (H) 4.8 - 5.6 % Final    Comment:    (NOTE) Pre diabetes:          5.7%-6.4%  Diabetes:              >6.4%  Glycemic control for   <7.0% adults with diabetes     CBG: Recent Labs  Lab 11/04/21 2008 11/04/21 2324 11/05/21 0006 11/05/21 0317 11/05/21 0725  GLUCAP 210* 235* 213* 181* 167*    Allergies Allergies  Allergen Reactions   Nifedipine Cough   Metformin Diarrhea    REACTION: diarrhea when dosage is increased, ok when taking one tablet daily, patient aware that she is on metformin when it is listed that she is allergic       DVT/GI PRX  assessed I Assessed the need for Labs I Assessed the need for Foley I Assessed the need for Central Venous Line Family Discussion when available I Assessed the need for Mobilization I made an Assessment of medications to be adjusted accordingly Safety Risk assessment completed  CASE DISCUSSED IN MULTIDISCIPLINARY ROUNDS WITH ICU TEAM     Critical Care Time devoted to patient care services described in this note is 50 minutes.  Critical care was necessary to treat or prevent imminent or  life-threatening deterioration.   PATIENT WITH VERY POOR PROGNOSIS I ANTICIPATE PROLONGED ICU LOS  Patient with Multiorgan failure and at high risk for cardiac arrest and death.    Corrin Parker, M.D.  [facility]  Medical Director [place] Director [facility] Cardio-Pulmonary Department

## 2021-11-06 DIAGNOSIS — I5033 Acute on chronic diastolic (congestive) heart failure: Secondary | ICD-10-CM | POA: Diagnosis not present

## 2021-11-06 LAB — CBC
HCT: 37.8 % (ref 36.0–46.0)
Hemoglobin: 11.1 g/dL — ABNORMAL LOW (ref 12.0–15.0)
MCH: 24.4 pg — ABNORMAL LOW (ref 26.0–34.0)
MCHC: 29.4 g/dL — ABNORMAL LOW (ref 30.0–36.0)
MCV: 83.1 fL (ref 80.0–100.0)
Platelets: 231 10*3/uL (ref 150–400)
RBC: 4.55 MIL/uL (ref 3.87–5.11)
RDW: 15.1 % (ref 11.5–15.5)
WBC: 1.8 10*3/uL — ABNORMAL LOW (ref 4.0–10.5)
nRBC: 11.4 % — ABNORMAL HIGH (ref 0.0–0.2)

## 2021-11-06 LAB — BASIC METABOLIC PANEL
Anion gap: 15 (ref 5–15)
BUN: 103 mg/dL — ABNORMAL HIGH (ref 8–23)
CO2: 29 mmol/L (ref 22–32)
Calcium: 9.3 mg/dL (ref 8.9–10.3)
Chloride: 105 mmol/L (ref 98–111)
Creatinine, Ser: 3.31 mg/dL — ABNORMAL HIGH (ref 0.44–1.00)
GFR, Estimated: 14 mL/min — ABNORMAL LOW (ref >60)
Glucose, Bld: 212 mg/dL — ABNORMAL HIGH (ref 70–99)
Potassium: 4.5 mmol/L (ref 3.5–5.1)
Sodium: 149 mmol/L — ABNORMAL HIGH (ref 135–145)

## 2021-11-06 LAB — MAGNESIUM: Magnesium: 2.2 mg/dL (ref 1.7–2.4)

## 2021-11-06 LAB — GLUCOSE, CAPILLARY
Glucose-Capillary: 160 mg/dL — ABNORMAL HIGH (ref 70–99)
Glucose-Capillary: 282 mg/dL — ABNORMAL HIGH (ref 70–99)
Glucose-Capillary: 338 mg/dL — ABNORMAL HIGH (ref 70–99)
Glucose-Capillary: 334 mg/dL — ABNORMAL HIGH (ref 70–99)

## 2021-11-06 LAB — HEPATIC FUNCTION PANEL
ALT: 1377 U/L — ABNORMAL HIGH (ref 0–44)
AST: 1255 U/L — ABNORMAL HIGH (ref 15–41)
Albumin: 2.5 g/dL — ABNORMAL LOW (ref 3.5–5.0)
Alkaline Phosphatase: 61 U/L (ref 38–126)
Bilirubin, Direct: 0.4 mg/dL — ABNORMAL HIGH (ref 0.0–0.2)
Indirect Bilirubin: 0.7 mg/dL (ref 0.3–0.9)
Total Bilirubin: 1.1 mg/dL (ref 0.3–1.2)
Total Protein: 5.5 g/dL — ABNORMAL LOW (ref 6.5–8.1)

## 2021-11-06 LAB — PROCALCITONIN: Procalcitonin: 89.16 ng/mL

## 2021-11-06 LAB — PHOSPHORUS: Phosphorus: 6.8 mg/dL — ABNORMAL HIGH (ref 2.5–4.6)

## 2021-11-06 MED ORDER — MORPHINE SULFATE (PF) 2 MG/ML IV SOLN: 2.0000 mg | INTRAVENOUS | Status: DC | PRN

## 2021-11-06 MED ORDER — MORPHINE SULFATE (PF) 2 MG/ML IV SOLN
INTRAVENOUS | Status: AC
  Administered 2021-11-06: 2 mg via INTRAVENOUS
  Filled 2021-11-06: qty 1

## 2021-11-06 MED ORDER — INSULIN ASPART 100 UNIT/ML IJ SOLN
0.0000 [IU] | INTRAMUSCULAR | Status: DC
  Administered 2021-11-06: 11 [IU] via SUBCUTANEOUS
  Filled 2021-11-06: qty 1

## 2021-11-06 MED ORDER — FREE WATER
100.0000 mL | Status: DC
  Administered 2021-11-06: 100 mL

## 2021-11-08 LAB — PATHOLOGIST SMEAR REVIEW

## 2021-11-12 ENCOUNTER — Other Ambulatory Visit: Payer: Medicare Other

## 2021-11-15 ENCOUNTER — Ambulatory Visit: Payer: Medicare Other | Admitting: Family

## 2021-11-19 ENCOUNTER — Ambulatory Visit: Payer: Medicare Other | Admitting: Radiation Oncology

## 2021-11-24 ENCOUNTER — Encounter: Payer: Medicare Other | Admitting: Internal Medicine

## 2021-11-24 NOTE — Progress Notes (Signed)
GOALS OF CARE DISCUSSION  The Clinical status was relayed to family in detail. Son, daughter-in-law, sister of patient at bedside Updated and notified of patients medical condition.    Patient remains unresponsive and will not open eyes to command.   Patient is having a weak cough and struggling to remove secretions.   Patient with increased WOB and using accessory muscles to breathe Explained to family course of therapy and the modalities    Patient with Progressive multiorgan failure with a very high probablity of a very minimal chance of meaningful recovery despite all aggressive and optimal medical therapy.   Family understands the situation.  They have consented and agreed to DNR status Patient is suffering and dying  Family are satisfied with Plan of action and management. All questions answered  Additional CC time 25 mins   Falon Huesca Patricia Pesa, M.D.  [facility]  Medical Director [place] Director [facility] Cardio-Pulmonary Department

## 2021-11-24 NOTE — Death Summary Note (Addendum)
DEATH SUMMARY   Patient Details  Name: Kathryn Ware MRN: [id number] DOB: 06-18-1941  Admission/Discharge Information   Admit Date:  2021/11/26  Date of Death: Date of Death: 12/01/21  Time of Death: Time of Death: 36  Length of Stay: 5  Referring Physician: Venia Carbon, MD   Reason(s) for Hospitalization  Acute hypoxic respiratory failure secondary to new onset CHF   Diagnoses  Preliminary cause of death: Ischemic cardiomyopathy Secondary Diagnoses (including complications and co-morbidities):  Principal Problem:   Acute on chronic diastolic CHF (congestive heart failure) ([place]) Active Problems:   Type 2 diabetes mellitus with hyperlipidemia ([place])   Essential hypertension   Mild cognitive impairment   GI AVM (gastrointestinal arteriovenous vascular malformation)   Chronic blood loss anemia   Primary cancer of left upper lobe of lung (HCC)   CHF (congestive heart failure) ([place])   Bilateral lower extremity edema   Hypertensive urgency   Hyperthyroidism   Dementia with behavioral disturbance   Protein-calorie malnutrition, severe  Brief Hospital Course (including significant findings, care, treatment, and services provided and events leading to death)  Kathryn Ware is a 81 y.o. year old female who was admitted  on 11-26-21 with hypertensive urgency and new onset congestive heart failure. On 1/12 patient went into PEA cardiac arrest. CODE Blue initiated and patient intubated. After approximately 10-12 minutes of CPR with 2 rounds pf epinephrine, ROSC was achieved. Patient transfer to the ICU post cardiac arrest for continued management of Acute hypoxic respiratory failure secondary to Acute on chronic HFpEF (Hypertension Urgency now hypotensive in the setting of circulatory shock post cardiac arrest). Patient remained unresponsive with poor neurological exam and hypotensive requiring vasopressors. Patient with Progressive multiorgan failure with a very high probablity of a  very minimal chance of meaningful recovery despite all aggressive and optimal medical therapy. Goals of care discussed with patient's family at the bedside. Patient's code status changed to DNR on 11/05/2021.  On 12-01-2022, patient's family decide to withdraw care and transition patient to full comfort care including terminal extubation. Patient was terminally extubated at 14:27 pm and passed away shortly at 1434 pm with family at the bedside.  Pertinent Labs and Studies  Significant Diagnostic Studies DG Chest 1 View  Result Date: 11/04/2021 CLINICAL DATA:  Post intubation, OG tube placement EXAM: CHEST  1 VIEW COMPARISON:  Radiograph 11/04/2021 FINDINGS: Endotracheal tube overlies the midthoracic trachea. Orogastric tube passive below the diaphragm, tip excluded by collimation. Unchanged cardiomediastinal silhouette with aortic arch calcifications. There are diffuse interstitial opacities and patchy left upper lung airspace opacities. Emphysema. No pleural effusion. No pneumothorax. There is a skin fold overlying the left upper lung. No acute osseous abnormality. IMPRESSION: Endotracheal tube overlies the midthoracic trachea. COPD with chronic interstitial opacities and scarring. Increased left upper lung airspace opacities, attention on follow-up exam. Electronically Signed   By: Maurine Simmering M.D.   On: 11/04/2021 18:17   DG Chest 1 View  Result Date: 11-26-21 CLINICAL DATA:  Weakness EXAM: CHEST  1 VIEW COMPARISON:  09/28/2021 FINDINGS: Heart size is normal. Moderate to high-grade calcifications are again seen within the aortic arch. There is flattening of the diaphragms and moderate to high-grade hyperinflation, unchanged. There is new mild bilateral diffuse interstitial thickening. No focal airspace consolidation. Unchanged calcifications overlying the bilateral lung apices, seen to correspond to vascular calcifications of the bilateral subclavian arteries on prior 10/11/2021 CT. No pleural effusion or  pneumothorax. Moderate multilevel degenerative disc changes of the thoracic spine. Moderate  thoracolumbar dextrocurvature with moderate left sided disc space narrowing. IMPRESSION:: IMPRESSION: 1. Chronic moderate to high-grade hyperinflation compatible with COPD. 2. Increased mild bilateral interstitial thickening compatible with mild interstitial pulmonary edema. Electronically Signed   By: Yvonne Kendall   On: 11/15/2021 16:52   DG Abd 1 View  Result Date: 11/04/2021 CLINICAL DATA:  OG tube placement EXAM: ABDOMEN - 1 VIEW COMPARISON:  CT 10/11/2021 FINDINGS: Nonobstructive bowel gas pattern. Orogastric tube tip and side port overlie the stomach. No acute osseous abnormality. Multilevel degenerative changes of the spine with dextroconvex curvature. Calcified uterine fibroids overlie the pelvis. IMPRESSION: Orogastric tube tip and side port overlie the stomach. Electronically Signed   By: Maurine Simmering M.D.   On: 11/04/2021 18:18   CT HEAD WO CONTRAST (5MM)  Result Date: 11/04/2021 CLINICAL DATA:  Initial evaluation for acute neuro deficit, stroke suspected. EXAM: CT HEAD WITHOUT CONTRAST TECHNIQUE: Contiguous axial images were obtained from the base of the skull through the vertex without intravenous contrast. RADIATION DOSE REDUCTION: This exam was performed according to the departmental dose-optimization program which includes automated exposure control, adjustment of the mA and/or kV according to patient size and/or use of iterative reconstruction technique. COMPARISON:  Prior head CT from 11/02/2021. FINDINGS: Brain: Generalized age-related cerebral atrophy with mild chronic small vessel ischemic disease. Small remote lacunar infarct noted within the left thalamus. No acute intracranial hemorrhage. No acute large vessel territory infarct. No mass lesion, midline shift or mass effect no hydrocephalus or extra-axial fluid collection. Vascular: No hyperdense vessel. Scattered vascular calcifications noted  within the carotid siphons. Skull: Scalp soft tissues and calvarium demonstrate no acute finding. Sinuses/Orbits: Disconjugate gaze noted. Paranasal sinuses are clear. No mastoid effusion. Other: None. IMPRESSION: 1. No acute intracranial abnormality. 2. Generalized age-related cerebral atrophy with mild chronic small vessel ischemic disease. Small remote lacunar infarct at the left thalamus, stable. Electronically Signed   By: Jeannine Boga M.D.   On: 11/04/2021 22:58   CT Head Wo Contrast  Result Date: 11/02/2021 CLINICAL DATA:  Fall several times.  Stroke suspected EXAM: CT HEAD WITHOUT CONTRAST TECHNIQUE: Contiguous axial images were obtained from the base of the skull through the vertex without intravenous contrast. COMPARISON:  01/05/2020 FINDINGS: Brain: No evidence of acute infarction, hemorrhage, hydrocephalus, extra-axial collection or mass lesion/mass effect. Interval but well-defined low-density at the left thalamus. Cerebral volume loss in keeping with age. Vascular: No hyperdense vessel or unexpected calcification. Skull: Normal. Negative for fracture or focal lesion. Sinuses/Orbits: No acute finding. IMPRESSION: 1. No acute finding. 2. Interval but chronic appearing lacunar infarct at the left thalamus when compared to 2021. Electronically Signed   By: Jorje Guild M.D.   On: 11/02/2021 05:02   CT CHEST ABDOMEN PELVIS W CONTRAST  Result Date: 10/11/2021 CLINICAL DATA:  Unintentional weight loss of 25 lb. Left lung carcinoma with previous radiation therapy. EXAM: CT CHEST, ABDOMEN, AND PELVIS WITH CONTRAST TECHNIQUE: Multidetector CT imaging of the chest, abdomen and pelvis was performed following the standard protocol during bolus administration of intravenous contrast. CONTRAST:  30mL OMNIPAQUE IOHEXOL 300 MG/ML  SOLN COMPARISON:  Chest CT on 05/12/2021 and PET-CT on 11/12/2020 FINDINGS: CT CHEST FINDINGS Cardiovascular: No acute findings. Aortic and coronary atherosclerotic  calcification noted. Mediastinum/Lymph Nodes: No masses or pathologically enlarged lymph nodes identified. Lungs/Pleura: Subpleural nodular opacity in the lateral left upper lobe measures 18 x 8 mm, without significant change since previous study. 3 mm pulmonary nodule in the posterior right upper lobe on image 45/3  remains stable. Pleural-parenchymal scarring in the lateral right upper lobe is also unchanged. No new or enlarging pulmonary nodules or masses are identified. No evidence of acute infiltrate or pleural effusion. Moderate to severe centrilobular emphysema is again demonstrated. Musculoskeletal:  No suspicious bone lesions identified. CT ABDOMEN AND PELVIS FINDINGS Hepatobiliary: No masses identified. Tiny sub-cm cyst noted in left lobe. Gallbladder is unremarkable. No evidence of biliary ductal dilatation. Pancreas:  No mass or inflammatory changes. Spleen:  Within normal limits in size and appearance. Adrenals/Urinary tract: Severe right renal atrophy is unchanged. No masses or hydronephrosis. Stomach/Bowel: No evidence of obstruction, inflammatory process, or abnormal fluid collections. Vascular/Lymphatic: No pathologically enlarged lymph nodes identified. No acute vascular findings. Aortic atherosclerotic calcification noted. Reproductive: A few tiny calcified uterine fibroids are seen, largest measuring 1.5 cm. No other masses or free fluid identified. Other:  None. Musculoskeletal:  No suspicious bone lesions identified. IMPRESSION: Stable post treatment changes in lateral left upper lobe. No evidence of metastatic disease or other acute findings. Aortic Atherosclerosis (ICD10-I70.0) and Emphysema (ICD10-J43.9). Electronically Signed   By: Marlaine Hind M.D.   On: 10/11/2021 15:23   US Venous Img Lower Bilateral (DVT)  Result Date: 11/02/2021 CLINICAL DATA:  Bilateral lower extremity edema. EXAM: BILATERAL LOWER EXTREMITY VENOUS DOPPLER ULTRASOUND TECHNIQUE: Gray-scale sonography with  compression, as well as color and duplex ultrasound, were performed to evaluate the deep venous system(s) from the level of the common femoral vein through the popliteal and proximal calf veins. COMPARISON:  None. FINDINGS: VENOUS Normal compressibility of the common femoral, superficial femoral, and popliteal veins, as well as the visualized calf veins. Visualized portions of profunda femoral vein and great saphenous vein unremarkable. No filling defects to suggest DVT on grayscale or color Doppler imaging. Doppler waveforms show normal direction of venous flow, normal respiratory plasticity and response to augmentation. Limited views of the contralateral common femoral vein are unremarkable. OTHER None. Limitations: none IMPRESSION: Negative. Electronically Signed   By: Telford Nab M.D.   On: 11/02/2021 00:27   DG Chest Port 1 View  Result Date: 11/04/2021 CLINICAL DATA:  Dyspnea EXAM: PORTABLE CHEST 1 VIEW COMPARISON:  Portable exam 0160 hours compared to 11/05/2021 FINDINGS: Normal heart size, mediastinal contours, and pulmonary vascularity. Atherosclerotic calcification aorta. Emphysematous changes with mild chronic accentuation of interstitial markings and lateral LEFT apical scarring. No acute infiltrate, pleural effusion, or pneumothorax. Bones demineralized with dextroconvex scoliosis and degenerative changes thoracolumbar spine. IMPRESSION: Changes of COPD with minimal chronic interstitial prominence and LEFT apical scarring. No acute abnormalities. Aortic Atherosclerosis (ICD10-I70.0) and Emphysema (ICD10-J43.9). Electronically Signed   By: Lavonia Dana M.D.   On: 11/04/2021 10:08   US Breast Limited Uni Left Inc Axilla  Result Date: 10/26/2021 CLINICAL DATA:  Palpable LEFT axillary area with pain. History of LEFT-sided lung cancer and unintentional weight loss. History of BI-RADS 3 LEFT breast calcifications in 2018 without interval follow-up. EXAM: DIGITAL DIAGNOSTIC BILATERAL MAMMOGRAM WITH  TOMOSYNTHESIS AND CAD; ULTRASOUND LEFT BREAST LIMITED TECHNIQUE: Bilateral digital diagnostic mammography and breast tomosynthesis was performed. The images were evaluated with computer-aided detection.; Targeted ultrasound examination of the left breast was performed. COMPARISON:  Previous diagnostic exam dated June 23, 2017 ACR Breast Density Category d: The breast tissue is extremely dense, which lowers the sensitivity of mammography. FINDINGS: Diagnostic images of the LEFT breast demonstrate progressive coarsening of LEFT breast calcifications, consistent with benign dystrophic calcifications. Spot compression tomosynthesis views were obtained over the palpable area of concern in the LEFT breast. No  suspicious mammographic finding is identified in this area. No suspicious mass, microcalcification, or other finding is identified in either breast. On physical exam, no suspicious mass is appreciated. Targeted LEFT breast ultrasound was performed in the palpable area of concern at the LEFT axilla. No suspicious solid or cystic mass is identified. Benign LEFT axillary lymph nodes are noted as well as prominent tortuous vessels. IMPRESSION: 1. No mammographic or sonographic evidence of malignancy at the site of palpable/painful concern in the LEFT axilla. Given history of ipsilateral lung malignancy, further imaging with dedicated PET/CT could be considered if there is concern for rib or pleural involvement. Any further workup of the patient's symptoms should be based on the clinical assessment. Recommend routine annual screening mammogram in 1 year. 2. Progressive coarsening of LEFT breast calcifications, consistent with benign dystrophic calcifications. 3. No mammographic evidence of malignancy bilaterally. RECOMMENDATION: Screening mammogram in one year.(Code:[id number]) I have discussed the findings and recommendations with the patient. If applicable, a reminder letter will be sent to the patient regarding the  next appointment. BI-RADS CATEGORY  2: Benign. Electronically Signed   By: Valentino Saxon M.D.   On: 10/26/2021 14:30  MM DIAG BREAST TOMO BILATERAL  Result Date: 10/26/2021 CLINICAL DATA:  Palpable LEFT axillary area with pain. History of LEFT-sided lung cancer and unintentional weight loss. History of BI-RADS 3 LEFT breast calcifications in 2018 without interval follow-up. EXAM: DIGITAL DIAGNOSTIC BILATERAL MAMMOGRAM WITH TOMOSYNTHESIS AND CAD; ULTRASOUND LEFT BREAST LIMITED TECHNIQUE: Bilateral digital diagnostic mammography and breast tomosynthesis was performed. The images were evaluated with computer-aided detection.; Targeted ultrasound examination of the left breast was performed. COMPARISON:  Previous diagnostic exam dated June 23, 2017 ACR Breast Density Category d: The breast tissue is extremely dense, which lowers the sensitivity of mammography. FINDINGS: Diagnostic images of the LEFT breast demonstrate progressive coarsening of LEFT breast calcifications, consistent with benign dystrophic calcifications. Spot compression tomosynthesis views were obtained over the palpable area of concern in the LEFT breast. No suspicious mammographic finding is identified in this area. No suspicious mass, microcalcification, or other finding is identified in either breast. On physical exam, no suspicious mass is appreciated. Targeted LEFT breast ultrasound was performed in the palpable area of concern at the LEFT axilla. No suspicious solid or cystic mass is identified. Benign LEFT axillary lymph nodes are noted as well as prominent tortuous vessels. IMPRESSION: 1. No mammographic or sonographic evidence of malignancy at the site of palpable/painful concern in the LEFT axilla. Given history of ipsilateral lung malignancy, further imaging with dedicated PET/CT could be considered if there is concern for rib or pleural involvement. Any further workup of the patient's symptoms should be based on the clinical  assessment. Recommend routine annual screening mammogram in 1 year. 2. Progressive coarsening of LEFT breast calcifications, consistent with benign dystrophic calcifications. 3. No mammographic evidence of malignancy bilaterally. RECOMMENDATION: Screening mammogram in one year.(Code:[id number]) I have discussed the findings and recommendations with the patient. If applicable, a reminder letter will be sent to the patient regarding the next appointment. BI-RADS CATEGORY  2: Benign. Electronically Signed   By: Valentino Saxon M.D.   On: 10/26/2021 14:30  ECHOCARDIOGRAM COMPLETE  Result Date: 11/02/2021    ECHOCARDIOGRAM REPORT   Patient Name:   Kathryn Ware Date of Exam: 11/02/2021 Medical Rec #:  [id number]     Height:       65.0 in Accession #:    [id number]    Weight:       87.1 lb  Date of Birth:  1941-08-05     BSA:          1.389 m Patient Age:    80 years      BP:           152/65 mmHg Patient Gender: F             HR:           98 bpm. Exam Location:  [facility] Procedure: 2D Echo, Color Doppler and Cardiac Doppler Indications:     I50.31 congestive heart failure-Acute Diastolic  History:         Patient has prior history of Echocardiogram examinations. CHF,                  CAD, CKD, PAD and TIA; Risk Factors:Hypertension, Diabetes and                  Dyslipidemia.  Sonographer:     Charmayne Sheer Referring Phys:  [id number] Athena Masse Diagnosing Phys: [place]  1. Left ventricular ejection fraction, by estimation, is 60 to 65%. The left ventricle has normal function. The left ventricle has no regional wall motion abnormalities. There is severe concentric left ventricular hypertrophy. Left ventricular diastolic  parameters are consistent with Grade I diastolic dysfunction (impaired relaxation).  2. Right ventricular systolic function is normal. The right ventricular size is normal.  3. Left atrial size was mild to moderately dilated.  4. The mitral valve is myxomatous. Mild mitral valve  regurgitation. No evidence of mitral stenosis.  5. The aortic valve is normal in structure. Aortic valve regurgitation is not visualized. No aortic stenosis is present.  6. The inferior vena cava is normal in size with greater than 50% respiratory variability, suggesting right atrial pressure of 3 mmHg. FINDINGS  Left Ventricle: Left ventricular ejection fraction, by estimation, is 60 to 65%. The left ventricle has normal function. The left ventricle has no regional wall motion abnormalities. The left ventricular internal cavity size was normal in size. There is  severe concentric left ventricular hypertrophy. Left ventricular diastolic parameters are consistent with Grade I diastolic dysfunction (impaired relaxation). Right Ventricle: The right ventricular size is normal. No increase in right ventricular wall thickness. Right ventricular systolic function is normal. Left Atrium: Left atrial size was mild to moderately dilated. Right Atrium: Right atrial size was normal in size. Pericardium: There is no evidence of pericardial effusion. Mitral Valve: The mitral valve is myxomatous. There is severe thickening of the mitral valve leaflet(s). Mild mitral annular calcification. Mild mitral valve regurgitation. No evidence of mitral valve stenosis. MV peak gradient, 8.5 mmHg. The mean mitral  valve gradient is 4.0 mmHg. Tricuspid Valve: The tricuspid valve is normal in structure. Tricuspid valve regurgitation is mild . No evidence of tricuspid stenosis. Aortic Valve: The aortic valve is normal in structure. Aortic valve regurgitation is not visualized. No aortic stenosis is present. Aortic valve mean gradient measures 5.0 mmHg. Aortic valve peak gradient measures 8.2 mmHg. Aortic valve area, by VTI measures 3.26 cm. Pulmonic Valve: The pulmonic valve was normal in structure. Pulmonic valve regurgitation is not visualized. No evidence of pulmonic stenosis. Aorta: The aortic root is normal in size and structure. Venous:  The inferior vena cava is normal in size with greater than 50% respiratory variability, suggesting right atrial pressure of 3 mmHg. IAS/Shunts: No atrial level shunt detected by color flow Doppler.  LEFT VENTRICLE PLAX 2D LVIDd:  3.13 cm   Diastology LVIDs:         2.05 cm   LV e' medial:    4.03 cm/s LV PW:         1.02 cm   LV E/e' medial:  29.3 LV IVS:        0.62 cm   LV e' lateral:   4.35 cm/s LVOT diam:     2.20 cm   LV E/e' lateral: 27.1 LV SV:         84 LV SV Index:   60 LVOT Area:     3.80 cm  RIGHT VENTRICLE RV Basal diam:  1.70 cm LEFT ATRIUM             Index        RIGHT ATRIUM          Index LA diam:        2.40 cm 1.73 cm/m   RA Area:     4.13 cm LA Vol (A2C):   25.3 ml 18.21 ml/m  RA Volume:   4.40 ml  3.17 ml/m LA Vol (A4C):   31.0 ml 22.31 ml/m LA Biplane Vol: 27.6 ml 19.87 ml/m  AORTIC VALVE                     PULMONIC VALVE AV Area (Vmax):    3.08 cm      PV Vmax:       1.26 m/s AV Area (Vmean):   3.01 cm      PV Vmean:      85.200 cm/s AV Area (VTI):     3.26 cm      PV VTI:        0.163 m AV Vmax:           143.00 cm/s   PV Peak grad:  6.4 mmHg AV Vmean:          103.000 cm/s  PV Mean grad:  3.0 mmHg AV VTI:            0.258 m AV Peak Grad:      8.2 mmHg AV Mean Grad:      5.0 mmHg LVOT Vmax:         116.00 cm/s LVOT Vmean:        81.500 cm/s LVOT VTI:          0.221 m LVOT/AV VTI ratio: 0.86  AORTA Ao Root diam: 2.70 cm MITRAL VALVE MV Area (PHT): 6.60 cm     SHUNTS MV Area VTI:   3.23 cm     Systemic VTI:  0.22 m MV Peak grad:  8.5 mmHg     Systemic Diam: 2.20 cm MV Mean grad:  4.0 mmHg MV Vmax:       1.46 m/s MV Vmean:      99.6 cm/s MV Decel Time: 115 msec MV E velocity: 118.00 cm/s MV A velocity: 176.00 cm/s MV E/A ratio:  0.67 Shaukat Khan Electronically signed by Neoma Laming Signature Date/Time: 11/02/2021/9:24:38 PM    Final    US THYROID  Result Date: 11/02/2021 CLINICAL DATA:  Hyperthyroid. EXAM: THYROID ULTRASOUND TECHNIQUE: Ultrasound examination of the  thyroid gland and adjacent soft tissues was performed. COMPARISON:  None. FINDINGS: Parenchymal Echotexture: Markedly heterogenous. Gland vascularity within normal limits. Isthmus: 0.2 cm Right lobe: 6.0 x 2.4 x 1.9 cm Left lobe: 6.0 x 2.1 x 1.8 cm _________________________________________________________ Estimated total number of nodules >/= 1 cm: 4 Number of spongiform  nodules >/=  2 cm not described below (TR1): 0 Number of mixed cystic and solid nodules >/= 1.5 cm not described below (TR2): 0 _________________________________________________________ Nodule labeled 1 is a solid hypoechoic nodule (TR 4) in the superior right thyroid lobe measuring 1.1 x 1.0 x 0.5 cm. *Given size (>/= 1 - 1.4 cm) and appearance, a follow-up ultrasound in 1 year should be considered based on TI-RADS criteria. Nodule labeled 2 is a solid hypoechoic nodule (TR 4) in the mid right thyroid lobe measuring 1.1 x 1.1 x 0.9 cm. *Given size (>/= 1 - 1.4 cm) and appearance, a follow-up ultrasound in 1 year should be considered based on TI-RADS criteria. Nodule labeled 3 is a mixed solid and cystic hypoechoic nodule (TR 3) in the inferior left thyroid lobe measuring 1.1 x 1.1 x 0.9 cm. Given size (<1.4 cm) and appearance, this nodule does NOT meet TI-RADS criteria for biopsy or dedicated follow-up. Nodule labeled 4 is a solid hypoechoic nodule (TR 4) in the mid left thyroid lobe measuring 1.0 x 0.9 x 0.5 cm. *Given size (>/= 1 - 1.4 cm) and appearance, a follow-up ultrasound in 1 year should be considered based on TI-RADS criteria. IMPRESSION: 1. Enlarged, heterogeneous multinodular thyroid gland. Thyroid gland vascularity appears within normal limits. 2. Nodules labeled 1, 2 and 4 meet criteria for follow-up ultrasound in 1 year. The above is in keeping with the ACR TI-RADS recommendations - J Am Coll Radiol 2017;14:587-595. Electronically Signed   By: Albin Felling M.D.   On: 11/02/2021 14:13    Microbiology Recent Results (from the past  240 hour(s))  Resp Panel by RT-PCR (Flu A&B, Covid) Nasopharyngeal Swab     Status: None   Collection Time: 11/18/2021  9:07 PM   Specimen: Nasopharyngeal Swab; Nasopharyngeal(NP) swabs in vial transport medium  Result Value Ref Range Status   SARS Coronavirus 2 by RT PCR NEGATIVE NEGATIVE Final    Comment: (NOTE) SARS-CoV-2 target nucleic acids are NOT DETECTED.  The SARS-CoV-2 RNA is generally detectable in upper respiratory specimens during the acute phase of infection. The lowest concentration of SARS-CoV-2 viral copies this assay can detect is 138 copies/mL. A negative result does not preclude SARS-Cov-2 infection and should not be used as the sole basis for treatment or other patient management decisions. A negative result may occur with  improper specimen collection/handling, submission of specimen other than nasopharyngeal swab, presence of viral mutation(s) within the areas targeted by this assay, and inadequate number of viral copies(<138 copies/mL). A negative result must be combined with clinical observations, patient history, and epidemiological information. The expected result is Negative.  Fact Sheet for Patients:  EntrepreneurPulse.com.au  Fact Sheet for Healthcare Providers:  IncredibleEmployment.be  This test is no t yet approved or cleared by the [place] FDA and  has been authorized for detection and/or diagnosis of SARS-CoV-2 by FDA under an Emergency Use Authorization (EUA). This EUA will remain  in effect (meaning this test can be used) for the duration of the COVID-19 declaration under Section 564(b)(1) of the Act, 21 U.S.C.section 360bbb-3(b)(1), unless the authorization is terminated  or revoked sooner.       Influenza A by PCR NEGATIVE NEGATIVE Final   Influenza B by PCR NEGATIVE NEGATIVE Final    Comment: (NOTE) The Xpert Xpress SARS-CoV-2/FLU/RSV plus assay is intended as an aid in the diagnosis of influenza  from Nasopharyngeal swab specimens and should not be used as a sole basis for treatment. Nasal washings and aspirates are  unacceptable for Xpert Xpress SARS-CoV-2/FLU/RSV testing.  Fact Sheet for Patients: EntrepreneurPulse.com.au  Fact Sheet for Healthcare Providers: IncredibleEmployment.be  This test is not yet approved or cleared by the [place] FDA and has been authorized for detection and/or diagnosis of SARS-CoV-2 by FDA under an Emergency Use Authorization (EUA). This EUA will remain in effect (meaning this test can be used) for the duration of the COVID-19 declaration under Section 564(b)(1) of the Act, 21 U.S.C. section 360bbb-3(b)(1), unless the authorization is terminated or revoked.  Performed at [facility], [address] [place], [place] [address]   MRSA Next Gen by PCR, Nasal     Status: None   Collection Time: 11/04/21  5:39 PM   Specimen: Nasal Mucosa; Nasal Swab  Result Value Ref Range Status   MRSA by PCR Next Gen NOT DETECTED NOT DETECTED Final    Comment: (NOTE) The GeneXpert MRSA Assay (FDA approved for NASAL specimens only), is one component of a comprehensive MRSA colonization surveillance program. It is not intended to diagnose MRSA infection nor to guide or monitor treatment for MRSA infections. Test performance is not FDA approved in patients less than 82 years old. Performed at [facility], [address] [place], [place] [address]     Lab Basic Metabolic Panel: Recent Labs  Lab 11/02/2021 1655 11/02/21 7591 11/03/21 0621 11/04/21 0705 11/04/21 1754 11/05/21 0447 11/05/21 1450 11/05/21 1710 2021-12-02 0443  NA 139   < > 140 141 145 145  --   --  149*  K 3.6   < > 3.9 3.6 4.7 4.2  --   --  4.5  CL 101   < > 97* 102 101 105  --   --  105  CO2 26   < > 30 31 29 31   --   --  29  GLUCOSE 70   < > 156* 158* 83 196*  --   --  212*  BUN 19   < > 52* 54* 52* 69*  --   --  103*   CREATININE 0.63   < > 1.01* 0.69 0.97 1.28*  --   --  3.31*  CALCIUM 9.7   < > 11.0* 10.6* 10.8* 9.8  --   --  9.3  MG 1.3*  --  2.3 2.1  --  2.2  --   --  2.2  PHOS  --   --  5.3*  --   --   --  5.6* 7.4* 6.8*   < > = values in this interval not displayed.   Liver Function Tests: Recent Labs  Lab 11/10/2021 1655 11/04/21 0705 11/05/21 0447 12-02-2021 0443  AST 29  --   --  1,255*  ALT 35  --   --  1,377*  ALKPHOS 57  --   --  61  BILITOT 0.5  --   --  1.1  PROT 6.4*  --   --  5.5*  ALBUMIN 3.1* 3.2* 2.8* 2.5*   No results for input(s): LIPASE, AMYLASE in the last 168 hours. No results for input(s): AMMONIA in the last 168 hours. CBC: Recent Labs  Lab 10/27/2021 1655 11/02/21 0614 11/03/21 0621 11/04/21 0705 11/04/21 1950 11/05/21 0447 Dec 02, 2021 0443  WBC 3.4* 2.9* 5.1 4.0 9.3 8.6 1.8*  NEUTROABS 2.1 2.3 3.8  --   --  7.3  --   HGB 9.3* 10.3* 11.3* 9.4* 11.3* 10.9* 11.1*  HCT 29.8* 33.1* 36.1 30.2* 37.4 36.6 37.8  MCV 81.4  80.7 79.3* 80.3 84.6 83.6 83.1  PLT 226 267 337 274 277 236 231   Cardiac Enzymes: No results for input(s): CKTOTAL, CKMB, CKMBINDEX, TROPONINI in the last 168 hours. Sepsis Labs: Recent Labs  Lab 11/04/21 0705 11/04/21 1754 11/04/21 1924 11/04/21 1950 11/04/21 2205 11/05/21 0447 2021-11-12 0443  PROCALCITON  --   --  <0.10  --   --  4.71 89.16  WBC 4.0  --   --  9.3  --  8.6 1.8*  LATICACIDVEN  --  6.2*  --   --  2.1*  --   --     Procedures/Operations   11/04/2021: Intubation     Rufina Falco, DNP, CCRN, FNP-C, AGACNP-BC Acute Care Nurse Practitioner  [facility] Pager: [phone] [place] [facility]

## 2021-11-24 NOTE — Progress Notes (Signed)
Pt extubated to comfort care.  

## 2021-11-24 NOTE — Progress Notes (Signed)
GOALS OF CARE DISCUSSION  The Clinical status was relayed to family in detail. Son and Daughter in law at bedside Updated and notified of patients medical condition.  Patient remains unresponsive and will not open eyes to command.   Patient is having a weak cough and struggling to remove secretions.   Patient with increased WOB and using accessory muscles to breathe Explained to family course of therapy and the modalities    Patient with Progressive multiorgan failure with a very high probablity of a very minimal chance of meaningful recovery despite all aggressive and optimal medical therapy.    Family understands the situation.  They have consented and agreed to DNR/DNI and would like to proceed with Comfort care measures.  Family are satisfied with Plan of action and management. All questions answered  Additional CC time 20 mins   Kathryn Ware Patricia Pesa, M.D.  [facility]  Medical Director [place] Director [facility] Cardio-Pulmonary Department

## 2021-11-24 NOTE — Progress Notes (Addendum)
NAME:  Kathryn Ware, MRN:  [id number], DOB:  09-14-1941, LOS: 5 ADMISSION DATE:  10/26/2021, INITIAL CONSULTATION DATE:  1/14 REFERRING MD:  Eppie Gibson, MD CHIEF COMPLAINT:  PEA Cardiac Arrest   Brief Patient Description  81 y.o. female with medical history significant for DM, HTN, gastric AVMs with history of chronic blood loss anemia, on weekly iron transfusions by hematology, mild cognitive deficit, stage I NSCLC completed XRT 01/12/2021, who presents to the ED for evaluation of lower extremity weakness.Chest x-ray: Increased mild bilateral interstitial thickening compatible with mild interstitial pulmonary edema Chronic moderate to high-grade hyperinflation compatible with COPD. Patient treated with Nitropaste and IV Lasix.  Patient admitted to hospitalist service with hypertensive urgency and new onset congestive heart failure.  On 1/12 patient went into PEA cardiac arrest. CODE Blue initiated and patient intubated. After approximately 10-12 minutes of CPR with 2 rounds pf epinephrine, ROSC was achieved. Patient transfer to the ICU.  Pertinent  Medical History    Anemia     Aortic atherosclerosis ([place])     Blood transfusion without reported diagnosis     CAD (coronary artery disease)     CHF (congestive heart failure) (HCC)     CKD (chronic kidney disease), stage III (HCC)     Diabetes mellitus type II     GERD (gastroesophageal reflux disease)     GI (gastrointestinal bleed)     GI AVM (gastrointestinal arteriovenous vascular malformation)      stomach   History of echocardiogram 12/2013    a. 12/2013: EF 60-65%, DD, mild LVH, nl RV size & systolic function, mild MR/TR, mild AS, nl RVSP; b. TTE 04/2017: EF 60-65%, normal wall motion, GR1DD, mild MR, left atrium normal in size, normal RV systolic function, PASP normal    History of nuclear stress test      a. 12/2013: low risk, no sig WMA, nondiag EKG 2/2 baseline LVH w/ repol abnl, no sig ischemia, EF 70% no artifact   HLD  (hyperlipidemia)     HTN (hypertension)     Hypercholesterolemia     IDA (iron deficiency anemia)     Mild cognitive impairment     PAD (peripheral artery disease) (HCC)     Polyp of colon, adenomatous     RLS (restless legs syndrome)     TIA (transient ischemic attack)    Significant Hospital Events: Including procedures, antibiotic start and stop dates in addition to other pertinent events   1/9: Admitted to progressive care unit with  hypertensive urgency and new onset congestive heart failure 1/12: PEA cardiac arrest, transferred to the ICU. PCCM consulted 1/13: Significant Bradycardia with hypotension requiring Atropine. CODE status change to DNR  Cultures:  11/17/2021: SARS-CoV-2 PCR>> negative 11/08/2021: Influenza PCR>> negative 11/15/2021: MRSA PCR>> negative   Antimicrobials:  None Interim History / Subjective:  -Remains critically ill, code status changed to DNR last night  OBJECTIVE   Blood pressure (!) 91/52, pulse (!) 130, temperature (!) 100.4 F (38 C), temperature source Axillary, resp. rate 18, height '5\' 5"'  (1.651 m), weight 35.4 kg, SpO2 97 %.    Vent Mode: PRVC FiO2 (%):  [45 %-100 %] 70 % Set Rate:  [22 bmp] 22 bmp Vt Set:  [300 mL] 300 mL PEEP:  [5 cmH20] 5 cmH20 Plateau Pressure:  [13 cmH20] 13 cmH20   Intake/Output Summary (Last 24 hours) at 12/03/2021 0821 Last data filed at 12-03-2021 0630 Gross per 24 hour  Intake 548.12 ml  Output 75 ml  Net 473.12 ml   Filed Weights   11/04/21 1740 11/05/21 0500 December 04, 2021 0500  Weight: 34.8 kg 35 kg 35.4 kg   Physical Examination: GENERAL:76 age-year-old patient lying in the bed INTUBATED and  Sedated EYES: Pupils equal, round, non reactive to light and accommodation. No scleral icterus. Extraocular muscles intact.  HEENT: Head atraumatic, normocephalic. Oropharynx and nasopharynx clear.  NECK:  Supple, no jugular venous distention. No thyroid enlargement, no tenderness.  LUNGS: Normal breath sounds bilaterally, no  wheezing, rales,rhonchi or crepitation. No use of accessory muscles of respiration.  CARDIOVASCULAR: S1, S2 normal. No murmurs, rubs, or gallops.  ABDOMEN: Soft, nontender, nondistended. Bowel sounds present. No organomegaly or mass.  EXTREMITIES: PITTING edema of lower extremities, cyanosis, or clubbing.  NEUROLOGIC: Patient does not respond to verbal stimuli.  Does not respond to deep sternal rub.  Does not follow commands.  No verbalizations noted.  Cranial Nerves: II: patient does not respond confrontation bilaterally, pupils right 2 mm, left 2 mm,and non reactive bilaterally III,IV,VI: doll's response absent bilaterally. Cold caloric responses absent bilaterally. V,VII: corneal reflex absent bilaterally  VIII: patient does not respond to verbal stimuli IX,X: gag reflex absent, XI: trapezius strength unable to test bilaterally XII: tongue strength unable to test Motor: Extremities flaccid throughout.  No spontaneous movement noted.  No purposeful movements noted. Sensory: Does not respond to noxious stimuli in any extremity. Deep Tendon Reflexes:  Absent throughout. Plantars: absent bilaterally Cerebellar: Unable to perform PSYCHIATRIC: The patient is intubated and sedated SKIN: No obvious rash, lesion, or ulcer.   Labs/imaging that I havepersonally reviewed  (right click and "Reselect all SmartList Selections" daily)    Labs   CBC: Recent Labs  Lab 11/23/2021 1655 11/02/21 0614 11/03/21 0621 11/04/21 0705 11/04/21 1950 11/05/21 0447 12/04/2021 0443  WBC 3.4* 2.9* 5.1 4.0 9.3 8.6 1.8*  NEUTROABS 2.1 2.3 3.8  --   --  7.3  --   HGB 9.3* 10.3* 11.3* 9.4* 11.3* 10.9* 11.1*  HCT 29.8* 33.1* 36.1 30.2* 37.4 36.6 37.8  MCV 81.4 80.7 79.3* 80.3 84.6 83.6 83.1  PLT 226 267 337 274 277 236 592    Basic Metabolic Panel: Recent Labs  Lab 10/24/2021 1655 11/02/21 0614 11/03/21 0621 11/04/21 0705 11/04/21 1754 11/05/21 0447 11/05/21 1450 11/05/21 1710  NA 139 142 140 141  145 145  --   --   K 3.6 3.5 3.9 3.6 4.7 4.2  --   --   CL 101 100 97* 102 101 105  --   --   CO2 '26 24 30 31 29 31  ' --   --   GLUCOSE 70 73 156* 158* 83 196*  --   --   BUN 19 25* 52* 54* 52* 69*  --   --   CREATININE 0.63 0.78 1.01* 0.69 0.97 1.28*  --   --   CALCIUM 9.7 10.5* 11.0* 10.6* 10.8* 9.8  --   --   MG 1.3*  --  2.3 2.1  --  2.2  --   --   PHOS  --   --  5.3*  --   --   --  5.6* 7.4*   GFR: Estimated Creatinine Clearance: 19.9 mL/min (A) (by C-G formula based on SCr of 1.28 mg/dL (H)). Recent Labs  Lab 11/04/21 0705 11/04/21 1754 11/04/21 1924 11/04/21 1950 11/04/21 2205 11/05/21 0447 12-04-2021 0443  PROCALCITON  --   --  <0.10  --   --  4.71  --  WBC 4.0  --   --  9.3  --  8.6 1.8*  LATICACIDVEN  --  6.2*  --   --  2.1*  --   --     Liver Function Tests: Recent Labs  Lab 10/25/2021 1655 11/04/21 0705 11/05/21 0447 11/07/2021 0443  AST 29  --   --  1,255*  ALT 35  --   --  1,377*  ALKPHOS 57  --   --  61  BILITOT 0.5  --   --  1.1  PROT 6.4*  --   --  5.5*  ALBUMIN 3.1* 3.2* 2.8* 2.5*   No results for input(s): LIPASE, AMYLASE in the last 168 hours. No results for input(s): AMMONIA in the last 168 hours.  ABG    Component Value Date/Time   PHART 7.33 (L) 11/05/2021 0500   PCO2ART 64 (H) 11/05/2021 0500   PO2ART 121 (H) 11/05/2021 0500   HCO3 33.7 (H) 11/05/2021 0500   ACIDBASEDEF 0.9 11/04/2021 1743   O2SAT 98.5 11/05/2021 0500     Coagulation Profile: No results for input(s): INR, PROTIME in the last 168 hours.  Cardiac Enzymes: No results for input(s): CKTOTAL, CKMB, CKMBINDEX, TROPONINI in the last 168 hours.  HbA1C: Hemoglobin A1C  Date/Time Value Ref Range Status  01/06/2014 12:39 AM 5.8 4.2 - 6.3 % Final    Comment:    The American Diabetes Association recommends that a primary goal of therapy should be <7% and that physicians should reevaluate the treatment regimen in patients with HbA1c values consistently >8%.    Hgb A1c MFr Bld   Date/Time Value Ref Range Status  11/02/2021 06:14 AM 5.5 4.8 - 5.6 % Final    Comment:    (NOTE) Pre diabetes:          5.7%-6.4%  Diabetes:              >6.4%  Glycemic control for   <7.0% adults with diabetes   08/22/2020 03:06 AM 5.9 (H) 4.8 - 5.6 % Final    Comment:    (NOTE) Pre diabetes:          5.7%-6.4%  Diabetes:              >6.4%  Glycemic control for   <7.0% adults with diabetes     CBG: Recent Labs  Lab 11/05/21 1840 11/05/21 1915 11/05/21 2318 November 07, 2021 0326 11-07-2021 0809  GLUCAP 138* 148* 128* 160* 282*    Allergies Allergies  Allergen Reactions   Nifedipine Cough   Metformin Diarrhea    REACTION: diarrhea when dosage is increased, ok when taking one tablet daily, patient aware that she is on metformin when it is listed that she is allergic     Home Medications  Prior to Admission medications   Medication Sig Start Date End Date Taking? Authorizing Provider  albuterol (VENTOLIN HFA) 108 (90 Base) MCG/ACT inhaler Inhale 2 puffs into the lungs every 6 (six) hours as needed for wheezing or shortness of breath. 10/08/21  Yes Venia Carbon, MD  clonazePAM (KLONOPIN) 0.5 MG tablet Take 1-2 tablets (0.5-1 mg total) by mouth at bedtime. 10/08/21  Yes Viviana Simpler I, MD  ferrous sulfate 325 (65 FE) MG tablet Take 325 mg by mouth daily with breakfast.   Yes [provider]  lisinopril-hydrochlorothiazide (ZESTORETIC) 20-12.5 MG tablet TAKE 2 TABLETS BY MOUTH DAILY Patient taking differently: Take 1 tablet by mouth daily. Take 1 tablet by  mouth daily 12/23/20  Yes Letvak,  Richard I, MD  metFORMIN (GLUCOPHAGE-XR) 500 MG 24 hr tablet TAKE 1 TABLET(500 MG) BY MOUTH DAILY WITH BREAKFAST 08/16/21  Yes Venia Carbon, MD  omeprazole (PRILOSEC) 40 MG capsule Take 1 capsule (40 mg total) by mouth in the morning and at bedtime. 06/09/21  Yes Venia Carbon, MD  simvastatin (ZOCOR) 80 MG tablet TAKE 1 TABLET(80 MG) BY MOUTH DAILY 01/11/21  Yes  Venia Carbon, MD  vitamin B-12 (CYANOCOBALAMIN) 1000 MCG tablet Take 1,000 mcg by mouth daily.   Yes [provider]  glucose blood (ONETOUCH VERIO) test strip Use to check blood sugar once daily E11.69 11/26/20   Venia Carbon, MD  OneTouch Delica Lancets 16X MISC 1 each by Does not apply route daily. Use to obtain blood sugar sample daily E11.69 11/26/20   Venia Carbon, MD  Scheduled Meds:  budesonide (PULMICORT) nebulizer solution  0.25 mg Nebulization BID   chlorhexidine gluconate (MEDLINE KIT)  15 mL Mouth Rinse BID   Chlorhexidine Gluconate Cloth  6 each Topical Q0600   docusate  100 mg Per Tube BID   feeding supplement (VITAL HIGH PROTEIN)  1,000 mL Per Tube Q24H   free water  30 mL Per Tube Q4H   mouth rinse  15 mL Mouth Rinse 10 times per day   methimazole  5 mg Per Tube Daily   multivitamin with minerals  1 tablet Per Tube Daily   nitroGLYCERIN  1 inch Topical Q6H   polyethylene glycol  17 g Per Tube Daily   simvastatin  80 mg Per Tube q1800   sodium chloride flush  3 mL Intravenous Q12H   Continuous Infusions:  sodium chloride 10 mL/hr at 11/05/21 1600   fentaNYL infusion INTRAVENOUS 150 mcg/hr (11/05/21 1600)   norepinephrine (LEVOPHED) Adult infusion 10 mcg/min (11/13/21 0724)   PRN Meds:.sodium chloride, acetaminophen, ipratropium-albuterol, labetalol, midazolam, ondansetron (ZOFRAN) IV, sodium chloride flush  ASSESSMENT & PLAN  Cardiac arrest: initial rhythm Bradycardia>Asystole  Circulatory shock PMHx: CAD -Continue vasopressors: levophed PRN, to maintain MAP > 60 -Echocardiogram ordered -Trend troponin, lactic -Cardiology consulted, appreciate input  Acute hypoxic respiratory failure in the setting of CHF exacerbation PMHx:Lung Cancer -Continue vent support -Wean PEEP and FiO2 for sats greater than 90% -Plateau pressures less than 30 cm H20 -VAP bundle in place -Intermittent chest x-ray & ABG -Ensure adequate pulmonary hygiene  -F/u  cultures, trend PCT -Bronchodilators as needed  Acute on chronic HFpEF (last known EF 60 to 65%) significantly elevated BNP 1929 Hypertension Urgency now hypotensive in the setting of circulatory shock post cardiac arrest and sedation Hx: CAD, HLD  -Continuous cardiac monitoring -Maintain MAP greater than 65 -Hold Lasix in the setting of hypotension -Hold BP meds -Cardiology consult -Repeat 2D Echocardiogram    At risk for anoxic encephalopathy. Poor neuro exam -Assess EEG if indicated for prognostication -repeat CT head negative  Acute Kidney Injury Hypomagnesemia -Monitor I&O's / urinary output -Follow BMP -Ensure adequate renal perfusion -Avoid nephrotoxic agents as able -Replace electrolytes as indicated  Elevated LFTs AST/ALT 1255/1377 Likely shock liver in the setting of cardiac arrest and multiorgan failure -Trend LFTs  Hyperthyroidism Likely driving Persistent tachycardia -TSH  <0.10, Free T4 5.05 -Continue Tapazole   Diabetes mellitus Recent hgbA1c 5.5 -CBGs -Sliding scale insulin -Follow ICU hyper/hypoglycemia protocol -Hold home Metformin     Best practice (right click and "Reselect all SmartList Selections" daily)  Diet:  NPO Pain/Anxiety/Delirium protocol (if indicated): Yes (RASS goal 0) VAP protocol (if indicated): Yes  DVT prophylaxis: Subcutaneous Heparin GI prophylaxis: PPI Glucose control:  SSI Yes Central venous access:  N/A Arterial line:  N/A Foley:  Yes, and it is still needed Mobility:  bed rest  PT consulted: N/A Last date of multidisciplinary goals of care discussion [1/14] Code Status:  DNR Disposition: ICU  Critical care time: [place], DNP, FNP-C, AGACNP-BC Acute Care Nurse Practitioner  [facility] Pager: [phone] [place] [facility]

## 2021-11-24 NOTE — Progress Notes (Signed)
PHARMACY CONSULT NOTE - FOLLOW UP  Pharmacy Consult for Electrolyte Monitoring and Replacement   Recent Labs: Potassium (mmol/L)  Date Value  Nov 29, 2021 4.5  01/06/2014 3.8   Magnesium (mg/dL)  Date Value  11-29-21 2.2  01/06/2014 1.6 (L)   Calcium (mg/dL)  Date Value  November 29, 2021 9.3   Calcium, Total (mg/dL)  Date Value  01/06/2014 8.6   Albumin (g/dL)  Date Value  2021-11-29 2.5 (L)  01/05/2014 3.7   Phosphorus (mg/dL)  Date Value  11/29/2021 6.8 (H)   Sodium (mmol/L)  Date Value  November 29, 2021 149 (H)  01/06/2014 141    Assessment: 81 y.o. female with medical history significant for DM, HTN, gastric AVMs with history of chronic blood loss anemia, on weekly iron transfusions by hematology, mild cognitive deficit, stage I NSCLC completed XRT 01/12/2021, who presents to the ED for evaluation of lower extremity weakness. Patient is currently intubated on ventilator support. OGT currently connected to low, intermittent suction. Pharmacy is asked to follow and replace electrolytes while in the CCU  Tube Feeds: Vital High Protein @ 20 ml/hr via OGT and increase by 10 ml every 12 hours to goal rate of 40 ml/hr  Goal of Therapy:  Electrolytes WNL  Plan:  Increase free water flushes to 100 mL every 2 hours Recheck electrolytes in am  Dallie Piles ,PharmD Clinical Pharmacist November 29, 2021 11:25 AM

## 2021-11-24 DEATH — deceased

## 2021-12-10 ENCOUNTER — Ambulatory Visit: Payer: Medicare Other

## 2021-12-10 ENCOUNTER — Other Ambulatory Visit: Payer: Medicare Other

## 2021-12-10 ENCOUNTER — Ambulatory Visit: Payer: Medicare Other | Admitting: Internal Medicine

## 2022-01-05 ENCOUNTER — Telehealth: Payer: Medicare Other
